# Patient Record
Sex: Male | Born: 1945
Health system: Southern US, Community
[De-identification: ages and names within clinical notes are randomized; demographics above are authoritative.]

## PROBLEM LIST (undated history)

## (undated) DIAGNOSIS — E785 Hyperlipidemia, unspecified: Secondary | ICD-10-CM

## (undated) DIAGNOSIS — E119 Type 2 diabetes mellitus without complications: Secondary | ICD-10-CM

## (undated) DIAGNOSIS — K219 Gastro-esophageal reflux disease without esophagitis: Secondary | ICD-10-CM

## (undated) DIAGNOSIS — I480 Paroxysmal atrial fibrillation: Secondary | ICD-10-CM

## (undated) DIAGNOSIS — R002 Palpitations: Secondary | ICD-10-CM

## (undated) DIAGNOSIS — I10 Essential (primary) hypertension: Secondary | ICD-10-CM

## (undated) HISTORY — PX: APPENDECTOMY: SHX54

## (undated) HISTORY — DX: Essential (primary) hypertension: I10

## (undated) HISTORY — DX: Type 2 diabetes mellitus without complications: E11.9

## (undated) HISTORY — PX: SHOULDER SURGERY: SHX246

## (undated) HISTORY — DX: Palpitations: R00.2

## (undated) HISTORY — PX: COLONOSCOPY: SHX174

## (undated) HISTORY — DX: Hyperlipidemia, unspecified: E78.5

## (undated) HISTORY — PX: CHOLECYSTECTOMY: SHX55

## (undated) HISTORY — DX: Gastro-esophageal reflux disease without esophagitis: K21.9

## (undated) HISTORY — DX: Paroxysmal atrial fibrillation: I48.0

## (undated) HISTORY — PX: HERNIA REPAIR: SHX51

## (undated) HISTORY — PX: TOTAL ANKLE ARTHROPLASTY: SHX811

## (undated) HISTORY — PX: LEG AMPUTATION: SHX1105

---

## 2003-02-21 ENCOUNTER — Ambulatory Visit (HOSPITAL_COMMUNITY): Admission: RE | Admit: 2003-02-21 | Discharge: 2003-02-21 | Payer: Self-pay | Admitting: Neurosurgery

## 2003-02-21 ENCOUNTER — Encounter: Payer: Self-pay | Admitting: Neurosurgery

## 2003-04-17 ENCOUNTER — Encounter: Payer: Self-pay | Admitting: Neurology

## 2003-04-17 ENCOUNTER — Encounter: Admission: RE | Admit: 2003-04-17 | Discharge: 2003-04-17 | Payer: Self-pay | Admitting: Neurology

## 2003-09-12 ENCOUNTER — Observation Stay (HOSPITAL_COMMUNITY): Admission: EM | Admit: 2003-09-12 | Discharge: 2003-09-12 | Payer: Self-pay | Admitting: Emergency Medicine

## 2004-05-18 ENCOUNTER — Ambulatory Visit (HOSPITAL_COMMUNITY): Admission: RE | Admit: 2004-05-18 | Discharge: 2004-05-18 | Payer: Self-pay | Admitting: Internal Medicine

## 2004-06-15 ENCOUNTER — Encounter: Admission: RE | Admit: 2004-06-15 | Discharge: 2004-06-15 | Payer: Self-pay | Admitting: Internal Medicine

## 2004-11-21 ENCOUNTER — Inpatient Hospital Stay (HOSPITAL_COMMUNITY): Admission: EM | Admit: 2004-11-21 | Discharge: 2004-11-25 | Payer: Self-pay | Admitting: Emergency Medicine

## 2004-11-21 ENCOUNTER — Ambulatory Visit: Payer: Self-pay | Admitting: *Deleted

## 2004-12-09 ENCOUNTER — Ambulatory Visit: Payer: Self-pay | Admitting: Internal Medicine

## 2010-05-04 ENCOUNTER — Ambulatory Visit: Payer: Self-pay | Admitting: Cardiovascular Disease

## 2010-05-05 ENCOUNTER — Telehealth (INDEPENDENT_AMBULATORY_CARE_PROVIDER_SITE_OTHER): Payer: Self-pay | Admitting: *Deleted

## 2010-05-17 ENCOUNTER — Telehealth (INDEPENDENT_AMBULATORY_CARE_PROVIDER_SITE_OTHER): Payer: Self-pay | Admitting: *Deleted

## 2010-05-18 ENCOUNTER — Ambulatory Visit: Payer: Self-pay

## 2010-05-18 ENCOUNTER — Encounter: Payer: Self-pay | Admitting: Cardiovascular Disease

## 2010-05-18 ENCOUNTER — Ambulatory Visit: Payer: Self-pay | Admitting: Cardiology

## 2010-05-18 ENCOUNTER — Encounter: Payer: Self-pay | Admitting: Cardiology

## 2010-05-18 ENCOUNTER — Ambulatory Visit (HOSPITAL_COMMUNITY): Admission: RE | Admit: 2010-05-18 | Discharge: 2010-05-18 | Payer: Self-pay | Admitting: Cardiovascular Disease

## 2010-05-18 ENCOUNTER — Encounter (HOSPITAL_COMMUNITY): Admission: RE | Admit: 2010-05-18 | Discharge: 2010-07-05 | Payer: Self-pay | Admitting: Cardiovascular Disease

## 2010-05-24 ENCOUNTER — Ambulatory Visit: Payer: Self-pay | Admitting: Cardiovascular Disease

## 2010-11-09 NOTE — Progress Notes (Signed)
----   Converted from flag ---- ---- 05/05/2010 9:57 AM, Marilynne Halsted, CMA, AAMA wrote: Pt has Medicare only.  no precert required  ---- 05/05/2010 8:42 AM, Dessie Coma  LPN wrote: Scheduled for Lexiscan and Echo at [address] on 8/9/11Dx: dyspnea  Thanks, Victorino Dike ------------------------------

## 2010-11-09 NOTE — Assessment & Plan Note (Signed)
Summary: Cardiology Nuclear Testing  Nuclear Med Background Indications for Stress Test: Evaluation for Ischemia   History: Heart Catheterization, Myocardial Perfusion Study  History Comments: >12 years ago Heart Cath: @ [facility] >10 years ago MPS: NL (in [place]) per Pt. H/O PAF  Symptoms: Chest Pain, Diaphoresis, Dizziness, DOE, Fatigue, Light-Headedness, Nausea, Near Syncope, Palpitations, Rapid HR, SOB, Syncope    Nuclear Pre-Procedure Cardiac Risk Factors: Family History - CAD, History of Smoking, Hypertension, Lipids, NIDDM Caffeine/Decaff Intake: None NPO After: 9:00 PM Lungs: Clear IV 0.9% NS with Angio Cath: 18g     IV Site: (R) AC IV Started by: Stanton Kidney EMT-P Chest Size (in) 42     Height (in): 70 Weight (lb): 214 BMI: 30.82 Tech Comments: Took metoprolol this am.  Nuclear Med Study 1 or 2 day study:  1 day     Stress Test Type:  Eugenie Birks Reading MD:  Willa Rough, MD     Referring MD:  Judie Petit. Arida Resting Radionuclide:  Technetium 31m Tetrofosmin     Resting Radionuclide Dose:  10.8 mCi  Stress Radionuclide:  Technetium 2m Tetrofosmin     Stress Radionuclide Dose:  32.7 mCi   Stress Protocol      Max HR:  86 bpm Max Systolic BP: 137 mm HgRate Pressure Product:  [address]  Lexiscan: 0.4 mg   Stress Test Technologist:  Irean Hong RN     Nuclear Technologist:  Harlow Asa CNMT  Rest Procedure  Myocardial perfusion imaging was performed at rest 45 minutes following the intravenous administration of Myoview Technetium 70m Tetrofosmin.  Stress Procedure  The patient received IV Lexiscan 0.4 mg over 15-seconds.  Myoview injected at 30-seconds.  There were no significant changes with infusion.  Quantitative spect images were obtained after a 45 minute delay.  QPS Raw Data Images:  Patient motion noted; appropriate software correction applied. Stress Images:  No diagnostic abnormalities Rest Images:  Same as stress Subtraction (SDS):  No evidence of  ischemia. Transient Ischemic Dilatation:  1.06  (Normal <1.22)  Lung/Heart Ratio:  .33  (Normal <0.45)  Quantitative Gated Spect Images QGS EDV:  90 ml QGS ESV:  31 ml QGS EF:  66 % QGS cine images:  Normal motion  Findings Normal nuclear study      Overall Impression  Exercise Capacity: Lexiscan BP Response: Normal blood pressure response. Clinical Symptoms: none ECG Impression: No significant ST segment change suggestive of ischemia. Overall Impression: Normal stress nuclear study.

## 2010-11-09 NOTE — Progress Notes (Signed)
Summary: Nuclear pre-Procedure  Phone Note Outgoing Call Call back at [facility] Phone [phone]   Call placed by: Stanton Kidney, EMT-P,  May 17, 2010 1:50 PM Summary of Call: Unable to leave message with information on Myoview Information Sheet (see scanned document for details). No answer, or machine.    Nuclear Med Background Indications for Stress Test: Evaluation for Ischemia    History Comments: H/O PAF  Symptoms: Chest Pain, Dizziness, DOE, Fatigue, Light-Headedness, Near Syncope, Palpitations    Nuclear Pre-Procedure Cardiac Risk Factors: Family History - CAD, History of Smoking, Hypertension, Lipids, NIDDM

## 2010-11-25 ENCOUNTER — Ambulatory Visit: Payer: Self-pay | Admitting: Cardiovascular Disease

## 2011-01-22 ENCOUNTER — Encounter: Payer: Self-pay | Admitting: Cardiovascular Disease

## 2011-02-22 NOTE — Assessment & Plan Note (Signed)
[facility]                        [place] CARDIOLOGY OFFICE NOTE   SARAH, ZERBY                        MRN:          [id number]  DATE:05/24/2010                            DOB:          1945-12-18    PROBLEMS LIST:  1. History of paroxysmal atrial fibrillation back in 2005 and 2006.      He used to be on flecainide at that time, but none in the last 5      years.  2. Type 2 diabetes.  3. Hypertension.  4. Hyperlipidemia.   I evaluated Mr. Moravek back in July for symptoms of atypical chest pain,  exertional dyspnea, and palpitations.  Since the last visit, the patient  informs me that he stopped some of the recent medication that were  prescribed to him because he felt that he was getting side effects from  these medication.  Overall, he stopped taking gabapentin, amitriptyline,  diltiazem.  According to him, these medications were prescribed recently  and feels that he was getting side effects from these medications.  Since then, he says that he has not had any more chest pain or  palpitations.  His dyspnea is still the same, but this is chronic in  nature.  He is usually aware if his heart is in atrial fibrillation and  he has not felt any episodes of palpitations.   CURRENT MEDICATIONS:  1. Metoprolol 25 mg twice daily.  2. Pravastatin 40 mg daily.  3. Glipizide 5 mg once daily.  4. Aspirin 81 mg once daily.   ALLERGIES:  No known drug allergies.   PHYSICAL EXAMINATION:  GENERAL:  Overall, he appears to be in no acute  distress.  VITAL SIGNS:  Weight is 219.4 pounds, blood pressure is 136/84, pulse  initially was 103 and subsequently was 93 beats per minute on the EKG.  NECK:  No JVD or carotid bruits.  LUNGS:  Clear to auscultation.  HEART:  Regular rate and rhythm with no gallops or murmurs.  ABDOMEN:  Benign, nontender, nondistended.  EXTREMITIES:  With no clubbing, cyanosis, or edema.   IMPRESSION:  1. Atypical chest pain:   The patient's symptoms have resolved      completely since the last time he was seen.  He did undergo cardiac      evaluation with Lexiscan nuclear stress test which showed no      evidence of ischemia or infarct with normal ejection fraction.      Given that his symptoms have resolved, no further testing is      needed.  2. Palpitations and history of paroxysmal atrial fibrillation:  The      patient stopped some of his medications that were recently      prescribed according to him and since then has not had any      symptoms.  He is not taking diltiazem anymore.  His heart rate is      somewhat in the high end.  Currently, he is taking metoprolol 25 mg      twice daily.  I advised him to discuss with his  primary care      physician about alternative to the medications he stopped.  In the      meanwhile for his cardiac status, I will go ahead and increase      metoprolol to 50 mg twice daily. No need to resume Diltiazem.  We      will continue with aspirin 81 mg once daily.  Again as mentioned      above, the patient is not having symptoms of palpitations anymore.      I do not think a Holter monitor will show much at this time.  Both      his EKGs last visit and this visit shows normal sinus rhythm.  We      did do an echocardiogram which showed normal LV systolic function      and normal left atrial size.  There was no left ventricular      hypertrophy.  I explained to the patient to return if he gets      recurrent symptoms.  Otherwise, he will follow up in 6 months from      now.     [name], MD  Electronically Signed    MA/MedQ  DD: 05/24/2010  DT: 05/25/2010  Job #: [id number]

## 2011-02-22 NOTE — Letter (Signed)
May 04, 2010    [name], M.D.  [address]  [place], [place]  [address]   RE:  Darrell Horne, Darrell Horne  MRN:  [id number]  /  DOB:  May 24, 1946   Dear Dr. Nelson Chimes:   Thank you for referring Mr. Plancarte for further cardiac evaluation.  As  you are aware, this is a pleasant 65 year old gentleman with the with  the following problem list:  1. Type 2 diabetes.  2. Hypertension.  3. Hyperlipidemia.  4. History of paroxysmal atrial fibrillation back in 2005 and 2006.      At that time, he used to be on flecainide and was treated by Dr.      Graciela Husbands.   The patient is here today regarding his recent symptoms of chest pain,  dyspnea and palpitations.  These symptoms have been happening over the  last 6 months but have significantly worsened over the last few weeks.  The patient describes episodes of palpitations and tachycardia.  He  measured his heart rate on a few occasions, and his heart rate was one  time 173 according to the machine.  He felt very dizzy and lightheaded.  He did not have any syncopal episode at that time.  These episodes are  associated with dyspnea and dizziness.  Usually these episodes have been  happening almost every other day and sometimes on a daily basis.  He has  a history of paroxysmal atrial fibrillation and was treated in the past  by Dr. Graciela Husbands with flecainide.  He tells me that he stopped taking the  medication after he ran out of refills, and that was more than 5 years  ago.  What is also concerning is he started having episodes of chest  pain recently.  He has two different kinds of chest pain.  One is in the  lower chest area and epigastric area described as indigestion.  He also  has left-sided sharp chest pain which is worse with a deep breath and  cough.  He gets out of breath very easily with minimal activities  currently.  He is not able to do much physical activity due to his back  problems.  He uses a cane.   PAST MEDICAL HISTORY:  As mentioned above.   PAST SURGICAL HISTORY:  1. Ankle repair.  2. Cholecystectomy.  3. Shoulder surgery.   SOCIAL HISTORY:  He used to smoke but quit in 1983.  He denies any  recreational drug use.  He drinks daily wine with supper, up to three  glasses on occasion.  He used to be a heavier drinker in the past.  He  is a retired [employer].   FAMILY HISTORY:  Negative for premature coronary artery disease.  His  sister had a cardiac stent done but she was in her early 54s.   REVIEW OF SYSTEMS:  Remarkable for dyspnea, generalized fatigue,  heartburn, arthritis pain, depression, chest pain, palpitations and  presyncope.  A full review of systems was performed and is otherwise  negative.   PHYSICAL EXAMINATION:  GENERAL:  He appears to be in no acute distress.  VITAL SIGNS:  Weight is 216 pounds, blood pressure is 106/67 and pulse  is 75 beats per minute.  NECK:  No masses, JVD or carotid bruits.  RESPIRATORY:  Normal respiratory effort with no use of accessory  muscles.  Auscultation reveals normal breath sounds with no crackles or  wheezing.  CARDIOVASCULAR:  Normal PMI.  Normal S1-S2 with no appreciated  gallops  or murmurs.  ABDOMEN:  Benign, nontender, nondistended with no masses felt.  EXTREMITIES:  With no clubbing, cyanosis or edema.  SKIN:  Unremarkable with no rash.  PSYCHIATRIC:  He is alert and oriented x3.  Mood and affect are normal.   ELECTROCARDIOGRAM:  Electrocardiogram was done and interpreted by me.  This shows normal sinus rhythm with no significant ST or T-wave changes.   IMPRESSION:  1. Chest pain:  This has some anginal and some non-anginal features.      Most of these episodes are happening at rest.  He has a component      of indigestion which might be due to gastroesophageal reflux      disease.  He also has pleuritic chest pain on the left side.      Obviously, he has multiple risk factors for coronary artery disease      including type 2 diabetes, hypertension and  hyperlipidemia.  This      definitely will require further evaluation, and I recommend      proceeding with a Lexiscan nuclear stress test and an      echocardiogram.  In the meanwhile, I asked him to start taking      aspirin 81 mg daily.  He will continue the rest of his medications.  2. Palpitations and history of paroxysmal atrial fibrillation:  He      used to be on flecainide in the past but has not taken it for at      least 5 years.  It is possible that he is having recurrent episodes      of atrial fibrillation.  He is currently in sinus rhythm.  I will      obtain a 48-hour Holter monitor for further evaluation.  If atrial      fibrillation is confirmed, then we might need to consider long-term      anticoagulation with warfarin, given that his CHADS II score is 2.      If no arrhythmia is identified in 48 hours monitoring, then we      might need longer monitoring with an event monitor.  3. Hypertension.  Blood pressure is well-controlled.  The patient will      follow-up after this cardiac testing is performed.   Thank you for allowing me to participate in the care of your patient.    Sincerely,      [name], MD  Electronically Signed    MA/MedQ  DD: 05/04/2010  DT: 05/04/2010  Job #: [id number]

## 2011-02-24 ENCOUNTER — Emergency Department (HOSPITAL_COMMUNITY): Payer: Medicare Other

## 2011-02-24 ENCOUNTER — Inpatient Hospital Stay (HOSPITAL_COMMUNITY)
Admission: EM | Admit: 2011-02-24 | Discharge: 2011-02-27 | DRG: 312 | Disposition: A | Discharge status: Home / Self Care | Payer: Medicare Other | Attending: Internal Medicine | Admitting: Internal Medicine

## 2011-02-24 DIAGNOSIS — I4891 Unspecified atrial fibrillation: Secondary | ICD-10-CM | POA: Diagnosis present

## 2011-02-24 DIAGNOSIS — E872 Acidosis, unspecified: Secondary | ICD-10-CM | POA: Diagnosis present

## 2011-02-24 DIAGNOSIS — E119 Type 2 diabetes mellitus without complications: Secondary | ICD-10-CM | POA: Diagnosis present

## 2011-02-24 DIAGNOSIS — F3289 Other specified depressive episodes: Secondary | ICD-10-CM | POA: Diagnosis present

## 2011-02-24 DIAGNOSIS — I10 Essential (primary) hypertension: Secondary | ICD-10-CM | POA: Diagnosis present

## 2011-02-24 DIAGNOSIS — I9589 Other hypotension: Principal | ICD-10-CM | POA: Diagnosis present

## 2011-02-24 DIAGNOSIS — T465X5A Adverse effect of other antihypertensive drugs, initial encounter: Secondary | ICD-10-CM | POA: Diagnosis present

## 2011-02-24 DIAGNOSIS — E871 Hypo-osmolality and hyponatremia: Secondary | ICD-10-CM | POA: Diagnosis present

## 2011-02-24 DIAGNOSIS — E785 Hyperlipidemia, unspecified: Secondary | ICD-10-CM | POA: Diagnosis present

## 2011-02-24 DIAGNOSIS — F329 Major depressive disorder, single episode, unspecified: Secondary | ICD-10-CM | POA: Diagnosis present

## 2011-02-24 DIAGNOSIS — Z79899 Other long term (current) drug therapy: Secondary | ICD-10-CM

## 2011-02-24 DIAGNOSIS — N179 Acute kidney failure, unspecified: Secondary | ICD-10-CM | POA: Diagnosis present

## 2011-02-24 DIAGNOSIS — Z87891 Personal history of nicotine dependence: Secondary | ICD-10-CM

## 2011-02-24 DIAGNOSIS — Z7982 Long term (current) use of aspirin: Secondary | ICD-10-CM

## 2011-02-24 DIAGNOSIS — I129 Hypertensive chronic kidney disease with stage 1 through stage 4 chronic kidney disease, or unspecified chronic kidney disease: Secondary | ICD-10-CM | POA: Diagnosis present

## 2011-02-24 DIAGNOSIS — Y92009 Unspecified place in unspecified non-institutional (private) residence as the place of occurrence of the external cause: Secondary | ICD-10-CM

## 2011-02-24 DIAGNOSIS — N181 Chronic kidney disease, stage 1: Secondary | ICD-10-CM | POA: Diagnosis present

## 2011-02-24 DIAGNOSIS — D72829 Elevated white blood cell count, unspecified: Secondary | ICD-10-CM | POA: Diagnosis present

## 2011-02-24 LAB — GLUCOSE, CAPILLARY: Glucose-Capillary: 209 mg/dL — ABNORMAL HIGH (ref 70–99)

## 2011-02-24 LAB — CBC
HCT: 41.2 % (ref 39.0–52.0)
Hemoglobin: 14.7 g/dL (ref 13.0–17.0)
MCH: 34.8 pg — ABNORMAL HIGH (ref 26.0–34.0)
MCHC: 35.7 g/dL (ref 30.0–36.0)
MCV: 97.6 fL (ref 78.0–100.0)
Platelets: 230 10*3/uL (ref 150–400)
RBC: 4.22 MIL/uL (ref 4.22–5.81)
RDW: 14.5 % (ref 11.5–15.5)
WBC: 12.5 10*3/uL — ABNORMAL HIGH (ref 4.0–10.5)

## 2011-02-24 LAB — BASIC METABOLIC PANEL
BUN: 10 mg/dL (ref 6–23)
CO2: 26 mEq/L (ref 19–32)
Calcium: 9.7 mg/dL (ref 8.4–10.5)
Chloride: 98 mEq/L (ref 96–112)
Creatinine, Ser: 1.92 mg/dL — ABNORMAL HIGH (ref 0.4–1.5)
GFR calc Af Amer: 43 mL/min — ABNORMAL LOW (ref >60)
GFR calc non Af Amer: 35 mL/min — ABNORMAL LOW (ref >60)
Glucose, Bld: 200 mg/dL — ABNORMAL HIGH (ref 70–99)
Potassium: 4.8 mEq/L (ref 3.5–5.1)
Sodium: 133 mEq/L — ABNORMAL LOW (ref 135–145)

## 2011-02-24 LAB — LACTIC ACID, PLASMA: Lactic Acid, Venous: 2.5 mmol/L — ABNORMAL HIGH (ref 0.5–2.2)

## 2011-02-25 LAB — CARDIAC PANEL(CRET KIN+CKTOT+MB+TROPI)
CK, MB: 4.8 ng/mL — ABNORMAL HIGH (ref 0.3–4.0)
CK, MB: 5.8 ng/mL — ABNORMAL HIGH (ref 0.3–4.0)
CK, MB: 5.6 ng/mL — ABNORMAL HIGH (ref 0.3–4.0)
Relative Index: INVALID (ref 0.0–2.5)
Relative Index: INVALID (ref 0.0–2.5)
Relative Index: INVALID (ref 0.0–2.5)
Total CK: 72 U/L (ref 7–232)
Total CK: 85 U/L (ref 7–232)
Total CK: 79 U/L (ref 7–232)
Troponin I: <0.3 ng/mL (ref <0.30)
Troponin I: <0.3 ng/mL (ref <0.30)
Troponin I: <0.3 ng/mL (ref <0.30)

## 2011-02-25 LAB — HEMOGLOBIN A1C
Hgb A1c MFr Bld: 6.1 % — ABNORMAL HIGH (ref <5.7)
Mean Plasma Glucose: 128 mg/dL — ABNORMAL HIGH (ref <117)

## 2011-02-25 LAB — URINALYSIS, ROUTINE W REFLEX MICROSCOPIC
Bilirubin Urine: NEGATIVE
Glucose, UA: NEGATIVE mg/dL
Hgb urine dipstick: NEGATIVE
Ketones, ur: NEGATIVE mg/dL
Nitrite: NEGATIVE
Protein, ur: NEGATIVE mg/dL
Specific Gravity, Urine: 1.018 (ref 1.005–1.030)
Urobilinogen, UA: 1 mg/dL (ref 0.0–1.0)
pH: 5 (ref 5.0–8.0)

## 2011-02-25 LAB — GLUCOSE, CAPILLARY
Glucose-Capillary: 125 mg/dL — ABNORMAL HIGH (ref 70–99)
Glucose-Capillary: 100 mg/dL — ABNORMAL HIGH (ref 70–99)
Glucose-Capillary: 146 mg/dL — ABNORMAL HIGH (ref 70–99)
Glucose-Capillary: 152 mg/dL — ABNORMAL HIGH (ref 70–99)

## 2011-02-25 LAB — TSH: TSH: 1.406 u[IU]/mL (ref 0.350–4.500)

## 2011-02-25 LAB — CBC
HCT: 38.5 % — ABNORMAL LOW (ref 39.0–52.0)
Hemoglobin: 12.9 g/dL — ABNORMAL LOW (ref 13.0–17.0)
MCH: 33.1 pg (ref 26.0–34.0)
MCHC: 33.5 g/dL (ref 30.0–36.0)
MCV: 98.7 fL (ref 78.0–100.0)
Platelets: 171 10*3/uL (ref 150–400)
RBC: 3.9 MIL/uL — ABNORMAL LOW (ref 4.22–5.81)
RDW: 14.7 % (ref 11.5–15.5)
WBC: 9 10*3/uL (ref 4.0–10.5)

## 2011-02-25 LAB — TROPONIN I: Troponin I: <0.3 ng/mL (ref <0.30)

## 2011-02-25 LAB — LIPID PANEL
Cholesterol: 182 mg/dL (ref 0–200)
HDL: 77 mg/dL (ref >39)
LDL Cholesterol: 87 mg/dL (ref 0–99)
Total CHOL/HDL Ratio: 2.4 RATIO
Triglycerides: 91 mg/dL (ref <150)
VLDL: 18 mg/dL (ref 0–40)

## 2011-02-25 LAB — CK TOTAL AND CKMB (NOT AT ARMC)
CK, MB: 4.2 ng/mL — ABNORMAL HIGH (ref 0.3–4.0)
Relative Index: INVALID (ref 0.0–2.5)
Total CK: 71 U/L (ref 7–232)

## 2011-02-25 LAB — MAGNESIUM: Magnesium: 1.9 mg/dL (ref 1.5–2.5)

## 2011-02-25 LAB — PHOSPHORUS: Phosphorus: 3 mg/dL (ref 2.3–4.6)

## 2011-02-25 LAB — PRO B NATRIURETIC PEPTIDE: Pro B Natriuretic peptide (BNP): 504.2 pg/mL — ABNORMAL HIGH (ref 0–125)

## 2011-02-25 NOTE — Discharge Summary (Signed)
NAME:  Darrell Horne, Darrell Horne                 ACCOUNT NO.:  [id number]   MEDICAL RECORD NO.:  [id number]          PATIENT TYPE:  INP   LOCATION:  4741                         FACILITY:  [facility]   PHYSICIAN:  Mark C. Ophelia Charter, M.D.    DATE OF BIRTH:  June 10, 1946   DATE OF ADMISSION:  11/21/2004  DATE OF DISCHARGE:  11/25/2004                                 DISCHARGE SUMMARY   FINAL DIAGNOSES:  1.  Right trimalleolar ankle fracture dislocation with open reduction and      internal fixation, medial and lateral malleolus.  2.  Supraventricular tachycardia with left bundle branch block.  3.  Coronary artery disease.  4.  Atrial fibrillation and angina.   CONSULTATIONS:  Duke Salvia, MD, Cardiology.   PROCEDURE:  ORIF, right medial and lateral malleolus, for a trimalleolar  ankle fracture, November 22, 2004.   HISTORY OF PRESENT ILLNESS:  This 65 year old healthy male fell on steps  suffering a right trimalleolar ankle fracture.  He is followed by Dr. Graciela Husbands  and is on Cardizem-XL 240 mg daily, Toprol 25 mg daily, Flecanamide 50 mg  daily for a history of atrial-fib, and one baby aspirin a day.  The patient  was admitted, and routine preop labs included a CBC with a hemoglobin of 14,  PT and PTT were normal.  Electrolytes showed normal findings with a  borderline low calcium of 8.1.  Triglycerides 88, HDL 65, LDL cholesterol  105, bLDL cholesterol 18.  X-rays of the ankle showed trimalleolar ankle  fracture dislocation.  EKG showed sinus bradycardia, left axis deviation.   The patient was seen by Cardiology.  Postoperatively he was on a telemetry  bed.  He had a run of SVT the morning of November 23, 2004.  He was seen by  Physical Therapy.  He made good progress and was discharged on November 25, 2004.   SPECIAL INSTRUCTIONS:  1.  His medications at discharge were the same as on admission plus Tylox.  2.  He was ambulatory and released by Physical Therapy.  3.  He was seen by the case manager  for home care needs for crutches.  4.  He was asked to follow up in one week with Dr. Ophelia Charter and in two weeks      with Dr. Graciela Husbands.   CONDITION ON DISCHARGE:  Satisfactory.      MCY/MEDQ  D:  01/23/2005  T:  01/23/2005  Job:  [id number]

## 2011-02-25 NOTE — Op Note (Signed)
NAME:  Darrell Horne, Darrell Horne                 ACCOUNT NO.:  [id number]   MEDICAL RECORD NO.:  [id number]          PATIENT TYPE:  INP   LOCATION:  5035                         FACILITY:  [facility]   PHYSICIAN:  Mark C. Ophelia Charter, M.D.    DATE OF BIRTH:  1946-08-05   DATE OF PROCEDURE:  11/22/2004  DATE OF DISCHARGE:                                 OPERATIVE REPORT   PREOPERATIVE DIAGNOSIS:  Right trimalleolar ankle fracture dislocation.   POSTOPERATIVE DIAGNOSIS:  Right trimalleolar ankle fracture dislocation.   OPERATION PERFORMED:  Open reduction internal fixation, right medial and  lateral malleolus for trimalleolar ankle fracture.   SURGEON:  Mark C. Ophelia Charter, M.D.   ANESTHESIA:  General orotracheal anesthesia.   TOURNIQUET TIME:  37 minutes times 350 mmHg.   DESCRIPTION OF PROCEDURE:  After induction of general anesthesia,  orotracheal intubation, preoperative Ancef prophylaxis, standard DuraPrep up  to the knee, sterile stockinette and extremity sheet drapes were applied.  Medial side was opened first with a  C-shaped incision.  Joint was  irrigated.  There were no fragments in between and the posterior tib was  behind the medial malleolus.  Next, the lateral incision was opened.  Fibula  was distracted, irrigated, reduced, held with clamp and a 6-hole one third  tubular plate applied with two distal screws being unicortical cancellous  and proximal three screws bicortical.  Interfrag screw was then placed from  proximal anterior to posterior which was a 28 mm screw.  It was checked  under fluoroscopy and was anatomic.  Next, the medial malleolus was reduced  with a towel clip after repeat irrigation, pinned and two 40 mm cancellous  lag screws were placed.  Instrument count and needle count was correct.  Subcutaneous tissue was reapproximated with 2-0 Vicryl.  Skin staple  closure.  Xeroform, 4 x 4s, Webril and a short leg splint.  Patient was  transferred to recovery in stable  condition.      MCY/MEDQ  D:  11/22/2004  T:  11/23/2004  Job:  [id number]

## 2011-02-25 NOTE — H&P (Signed)
NAME:  Darrell Horne, Darrell Horne                           ACCOUNT NO.:  [id number]   MEDICAL RECORD NO.:  [id number]                   PATIENT TYPE:  EMS   LOCATION:  MAJO                                 FACILITY:  [facility]   PHYSICIAN:  Mark A. Perini, M.D.                DATE OF BIRTH:  [birth year]   DATE OF ADMISSION:  09/12/2003  DATE OF DISCHARGE:                                HISTORY & PHYSICAL   CHIEF COMPLAINT:  Palpitations, fast heart rate, shortness of breath.   HISTORY OF PRESENT ILLNESS:  Mr. Baratta is a pleasant 65 year old male with a  history significant for atrial fibrillation who awoke at 1 o'clock in the  morning and went to get a glass of water.  He suddenly had the onset of  palpitations and pounding heart.  He did have some shortness of breath with  this but no diaphoresis or chest pain.  He had felt normal when he went to  bed and in the last few days.  He has been fighting a cold the last two or  three days; however, he has not taken any over-the-counter medicines.  He  presented to the emergency room and was found to be in atrial fibrillation  with a rate of 160 beats per minute.  He was given 1 dose of 25 mg Cardizem  intravenously, and his heart rate is not 89 to 110, and he will require  admission for further evaluation.   PAST MEDICAL HISTORY:  1. Atrial fibrillation.  The patient reports possibly having some symptoms     of this on and off for the last several years, but it has been more so in     the last year.  He had a more significant episode in September 2004 when     he was admitted to [facility].  Apparently he had an echocardiogram and     some sort of Cardiolite study.  He reports that his stress test was     negative, and he was discharged on Coumadin and Cartia.  He is not sure     if he is normally in normal sinus rhythm lately or if he is in atrial     fibrillation chronically.  2. Gastroesophageal reflux disease.  3. No history of MI.  4. History of  appendectomy.  5. History of left shoulder surgery 10 years ago.  6. No history of diabetes.  7. History of hyperlipidemia.  8. History of low back pain and some left lower extremity radiculopathy-type     symptoms.   ALLERGIES:  No known allergies.   MEDICATIONS:  His INR was 2.1 yesterday, and he does take Coumadin 5 mg  daily with 7.5 mg on Tuesdays and Thursdays.  He is on Cartia XT 240 mg  daily.  He occasionally takes Prilosec as needed for reflux.  He has not  taken any over-the-counter medicines, although  he did take 1 Advil four days  ago.   SOCIAL HISTORY:  No tobacco currently.  He does have a 10-pack-year smoking  history but quit 20 years ago.  He drinks about two beers per day on  average, but he has had no increased alcohol drinking lately.  There is no  drug use history.   FAMILY HISTORY:  Mother is alive at age 75.  She has some mild heart  problems but no significant problems.  Father died at age 88 of a stroke.  He has three sisters and one brother.  One sister had diabetes.  One older  sister has had bypass at age 84.   REVIEW OF SYSTEMS:  The patient says he has had cold symptoms for one week  with some cough, headache, and nose and chest congestion.  He is  occasionally having productive cough at times.  He feels a little tired and  little bit short of breath at times.  No fevers, no chest pain, no edema,  although he did report his left calf being 1 cm larger than the right calf  for a day or two approximately two to three weeks ago.  He was also having  some left lower extremity symptoms that he thought was related to his back  at that time.  He denies any distant travel.  No genitourinary symptoms, no  abdominal symptoms, although he does have some reflux which is maybe a  little more recently.  He has one-time nocturia which is normal for him.   PHYSICAL EXAMINATION:  VITAL SIGNS:  Temperature 97.9, pulse originally 160,  now 89 to 105.  Respiratory rate  20, blood pressure 122/81, 100% saturation  on room air.  GENERAL:  He is in no acute distress, alert and oriented x 4.  He is  appropriate.  NECK:  No JVD, no bruits.  HEENT:  No icterus.  LUNGS:  Reveal bronchial breath sounds wit no wheezes, rales, or rhonchi.  HEART:  Irregular, irregular with no murmur, rub, or gallop.  ABDOMEN:  Soft, nontender, nondistended, with no mass or hepatosplenomegaly.  EXTREMITIES:  No edema, 2+ pulses throughout.   LABORATORY DATA:  Chest x-ray reveals slight cardiac enlargement,  questionably some increase in interstitial markings.   EKG shows atrial fibrillation with rapid ventricular response but no  significant ST or T wave changes.  Brain natriuretic peptide is less than  32.  PTT 47, INR 2.4.  White count 7.2 with a normal differential,  hemoglobin 14.6, platelet count 298,000.  CMET and cardiac enzymes are  pending at this time.   ASSESSMENT/PLAN:  A 65 year old male with atrial fibrillation with rapid  ventricular response.  There is not a clear cause for his escape of rate  control as he states he has been compliant with his Cartia XT.  Furthermore,  it is not clear if he has normally maintained a normal rhythm or if he has  been maintained in chronic atrial fibrillation.  His worsening may be due to  his viral syndrome or some bronchitis.  I doubt ongoing ischemia given his  recent negative stress test per his report, and his current EKG showing no  significant changes despite the tachycardia.  However, we will admit him to  a telemetry bed and place him on an IV Cardizem drip.  Will continue his  oral Coumadin.  Will obtain his [facility] records.  We will defer  the addition of other rate control agents and defer  the decision considering  cardiology consultation to his primary care physician.  We will add an  Atrovent MDI for bronchitis and continue proton pump inhibitor therapy.                                                Mark A. Waynard Edwards, M.D.    MAP/MEDQ  D:  09/12/2003  T:  09/12/2003  Job:  [id number]   cc:   Barry Dienes. Eloise Harman, M.D.  [address]  [place]  [place] [address]  Fax: [phone]

## 2011-02-25 NOTE — Consult Note (Signed)
NAME:  Darrell Horne, Darrell Horne NO.:  [id number]   MEDICAL RECORD NO.:  [id number]          PATIENT TYPE:  INP   LOCATION:  1826                         FACILITY:  [facility]   PHYSICIAN:  Vida Roller, M.D.   DATE OF BIRTH:  1945/11/11   DATE OF CONSULTATION:  11/21/2004  DATE OF DISCHARGE:                                   CONSULTATION   PRIMARY CARE PHYSICIAN:  Dr. Ivery Quale.   PRIMARY CARDIOLOGIST:  Dr. Sherryl Manges.   CHIEF COMPLAINT:  Preoperative clearance.   HISTORY OF PRESENT ILLNESS:  Mr. Raczkowski is a 65 year old male with no history  of coronary artery disease.  He has a history of paroxysmal atrial  fibrillation.  He fell this a.m. as he was ambulating but there was no  syncope, no palpitations and nothing that contributed to his fall.  He has a  trimalleolar ankle fracture and preoperative clearance is requested by  orthopedics prior to surgical repair.   Mr. Londo denies any chest pain or shortness of breath.  He has had no  recent palpitations and no recent syncope.  He has been compliant with his  medications.   PAST MEDICAL HISTORY:  1.  Significant for dyslipidemia.  2.  He has atrial fibrillation which is paroxysmal atrial fibrillation with      rapid ventricular response.  3.  Gastroesophageal reflux disease symptoms.  4.  History of rate-related left bundle branch block during a treadmill test      while on flecainide.  5.  Status post ischemic evaluation in [place] with an echocardiogram and      Cardiolite which showed no scars, ischemia, and an ejection fraction of      72% by report.   SURGICAL HISTORY:  Appendectomy and left shoulder arthroscopic surgery.   ALLERGIES:  No known drug allergies.   MEDICATIONS:  1.  Flecainide 50 mg b.i.d.  2.  Protonix daily.  3.  Zelnorm 6 mg b.i.d.  4.  Cardizem XL 240 mg daily.  5.  Toprol-XL 25 mg daily.  6.  Aspirin 81 mg daily.   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  He denies tobacco or drug abuse.  He drinks 2-3 rum drinks per day.  He states that he has tried to quit drinking in the past but was unable to  do so.   FAMILY HISTORY:  His mother is alive at age 19 with a history of heart  disease.  His father died at age 72 of a cerebrovascular accident with no  history of coronary artery disease.  He has a sister with heart disease.   REVIEW OF SYSTEMS:  Significant for chronic dyspnea on exertion with no  recent change.  He has had no recent complications with syncope.  He states  that he does have problems with depression and anxiety.  He has some chronic  arthralgias and pain in his right ankle at this time.   REVIEW OF SYSTEMS:  Otherwise negative.   PHYSICAL EXAMINATION:  VITAL SIGNS:  Temperature is 97.7, blood pressure  105/64, heart rate 57, respiratory rate 20, O2 saturation 96% on room air.  GENERAL:  He is a well-developed, well-nourished white male who smells of  EtOH.  HEENT:  Pupils are equal, round, reactive to light with extraocular  movements intact.  Sclerae are clear.  NECK:  Supple and there is no thyromegaly, bruit or JVD noted.  CARDIOVASCULAR:  His heart is regular in rate and rhythm with an S1, S2 and  no significant murmurs, rubs or gallops are noted.  LUNGS:  Clear to auscultation bilaterally.  SKIN:  No rashes are noted.  ABDOMEN:  Soft and nontender with active bowel sounds.  EXTREMITIES:  There is a splint on the right lower extremity.  The left  lower extremity and both upper extremities have 2+ pulses.  NEUROLOGICAL:  He is alert and oriented.  Cranial nerves II-XII grossly  intact.   EKG:  Sinus bradycardia, rate of 54.  His axis is -30 and QTc is 417.  There  is no Q waves, no ischemic changes and no hypertrophy.   LABORATORY VALUES:  Pending at the time of dictation.   ASSESSMENT AND PLAN:  1.  Status post trimalleolar right lower extremity fracture. Repair per      orthopedic.  2.   Long paroxysmal atrial fibrillation, maintained in sinus rhythm on      flecainide.  3.  Possible EtOH dependency.  He has no history of withdrawals.  4.  History of ischemic work up including an echocardiogram and Cardiolite      in 2004 without abnormalities.  Because of his atrial fibrillation, we      recommend telemetry monitoring for 24-48 hours after surgery.      Preoperatively, it is not necessary.  His flecainide, Toprol-XL, and      Cardizem will be continued at preoperative doses.  No further ischemic      workup is indicated preoperatively at this time but he will be followed      closely in the postoperative period.   Dr. Dorethea Clan saw the patient and determined the plan of care.      RB/MEDQ  D:  11/21/2004  T:  11/21/2004  Job:  [id number]

## 2011-02-26 LAB — COMPREHENSIVE METABOLIC PANEL
ALT: 21 U/L (ref 0–53)
AST: 26 U/L (ref 0–37)
Albumin: 2.6 g/dL — ABNORMAL LOW (ref 3.5–5.2)
Alkaline Phosphatase: 118 U/L — ABNORMAL HIGH (ref 39–117)
BUN: 13 mg/dL (ref 6–23)
CO2: 27 mEq/L (ref 19–32)
Calcium: 8.9 mg/dL (ref 8.4–10.5)
Chloride: 105 mEq/L (ref 96–112)
Creatinine, Ser: 1.13 mg/dL (ref 0.4–1.5)
GFR calc Af Amer: >60 mL/min (ref >60)
GFR calc non Af Amer: >60 mL/min (ref >60)
Glucose, Bld: 136 mg/dL — ABNORMAL HIGH (ref 70–99)
Potassium: 3.8 mEq/L (ref 3.5–5.1)
Sodium: 138 mEq/L (ref 135–145)
Total Bilirubin: 0.7 mg/dL (ref 0.3–1.2)
Total Protein: 5.9 g/dL — ABNORMAL LOW (ref 6.0–8.3)

## 2011-02-26 LAB — CBC
HCT: 37 % — ABNORMAL LOW (ref 39.0–52.0)
Hemoglobin: 12.8 g/dL — ABNORMAL LOW (ref 13.0–17.0)
MCH: 33.8 pg (ref 26.0–34.0)
MCHC: 34.6 g/dL (ref 30.0–36.0)
MCV: 97.6 fL (ref 78.0–100.0)
Platelets: 167 10*3/uL (ref 150–400)
RBC: 3.79 MIL/uL — ABNORMAL LOW (ref 4.22–5.81)
RDW: 14.2 % (ref 11.5–15.5)
WBC: 8.5 10*3/uL (ref 4.0–10.5)

## 2011-02-26 LAB — CARDIAC PANEL(CRET KIN+CKTOT+MB+TROPI)
CK, MB: 4.9 ng/mL — ABNORMAL HIGH (ref 0.3–4.0)
Relative Index: INVALID (ref 0.0–2.5)
Total CK: 62 U/L (ref 7–232)
Troponin I: <0.3 ng/mL (ref <0.30)

## 2011-02-26 LAB — GLUCOSE, CAPILLARY
Glucose-Capillary: 117 mg/dL — ABNORMAL HIGH (ref 70–99)
Glucose-Capillary: 101 mg/dL — ABNORMAL HIGH (ref 70–99)
Glucose-Capillary: 95 mg/dL (ref 70–99)
Glucose-Capillary: 101 mg/dL — ABNORMAL HIGH (ref 70–99)

## 2011-02-27 LAB — GLUCOSE, CAPILLARY
Glucose-Capillary: 122 mg/dL — ABNORMAL HIGH (ref 70–99)
Glucose-Capillary: 107 mg/dL — ABNORMAL HIGH (ref 70–99)
Glucose-Capillary: 103 mg/dL — ABNORMAL HIGH (ref 70–99)

## 2011-03-10 NOTE — H&P (Signed)
NAME:  DORRIS, VANGORDER                 ACCOUNT NO.:  [id number]  MEDICAL RECORD NO.:  [id number]           PATIENT TYPE:  O  LOCATION:  6742                         FACILITY:  [facility]  PHYSICIAN:  Lonia Blood, M.D.      DATE OF BIRTH:  08-Nov-1945  DATE OF ADMISSION:  02/24/2011 DATE OF DISCHARGE:                             HISTORY & PHYSICAL   PRIMARY CARE PHYSICIAN:  He is unassigned to [place].  PRESENTING COMPLAINT:  Near syncope.  HISTORY OF PRESENT ILLNESS:  The patient is a 65 year old male with history of hypertension and multiple other problems including atrial fibrillation.  He is on multiple medications and apparently was at home today prior to taking his medicine when he started feeling like he was going to pass out.  He was dizzy and the whole area was spinning and his eyes seemed like they are cut and being drawn.  His wife took his blood pressure at that time and it was 70/40.  He was therefore rushed to the emergency room.  In the ED, his initial blood pressure was found to be 80/50.  He responded slightly to hydration with bolus of 1 L of normal saline and systolic went to 14/78.  The patient is currently not feeling as bad as he was.  PAST MEDICAL HISTORY:  Significant for: 1. Paroxysmal atrial fibrillation. 2. Diabetes. 3. Hyperlipidemia. 4. History of hyponatremia. 5. Depression. 6. Hypertension. 7. Remote tobacco abuse. 8. Hyperlipidemia.  ALLERGIES:  No known drug allergies.  CURRENT MEDICATIONS: 1. Amitriptyline 25 mg nightly. 2. Aspirin 81 mg daily. 3. Atorvastatin 40 mg daily. 4. Citalopram 20 mg daily. 5. Gabapentin 300 mg t.i.d. 6. Glipizide 2.5 mg daily. 7. Losartan 50 mg daily. 8. Metformin 500 mg daily. 9. Metoprolol 50 mg twice a day. 10.Pradaxa 150 mg twice a day. 11.Taztia XT 300 mg daily.  SOCIAL HISTORY:  The patient lives here in [place] with his wife.  He used to smoke about a pack per day, but quit many years ago.  Denied  any alcohol or IV drug use.  FAMILY HISTORY:  Denied any significant family history except for hypertension.  REVIEW OF SYSTEMS:  All systems reviewed and are negative except per HPI.  PHYSICAL EXAMINATION:  VITAL SIGNS:  Temperature is 98.7, current blood pressure 94/49, initially 80/50, his pulse is 62, respiratory rate 12, sats 98% on room air. GENERAL:  He is awake, alert, and oriented.  He is in no acute distress. HEENT:  PERRLA.  EOMI.  No pallor, no jaundice.  No rhinorrhea. NECK:  Supple.  No JVD.  No lymphadenopathy. RESPIRATORY:  He has good air entry bilaterally.  No wheezes.  No rales. No crackles. CARDIOVASCULAR:  She has S1 and S2.  No audible murmur. ABDOMEN:  Soft, full, nontender with positive bowel sounds. EXTREMITIES:  No edema, cyanosis, or clubbing. SKIN:  No rashes or ulcers. MUSCULOSKELETAL:  No joint swelling or tenderness.  LABORATORY DATA:  White count is 12.5, hemoglobin 14.7, platelet of 230. Sodium is 133, potassium 4.8, chloride 98, CO2 of 26, glucose 200, BUN 10, creatinine 1.92, calcium 9.7.  Lactic acid level is 2.5.  Urinalysis showed amber urine, which is cloudy, but otherwise negative.  Chest x- ray showed no active cardiopulmonary disease.  His EKG showed normal sinus rhythm with a rate of 85, possible evidence of LVH.  ASSESSMENT:  This is a 65 year old gentleman who presented with presyncope more than likely secondary to hypotension.  The cause of his hypotension needs to be addressed, but otherwise the patient's presyncope seems to be from the low blood pressure.  PLAN: 1. Presyncope.  Again, we will address the cause of his hypotension.     This should fix the presyncopal episode right.  Currently, he is     feeling better, not dizzy after bolus of IV fluids. 2. Hypotension. A number of causes are possible.  The patient is on     multiple blood pressure medications that may have caused this.  He     is on Taztia, metoprolol as well as  losartan.  He heart rate is     also low.  He is slightly bradycardic, which may be secondary to     [place] as well as the metoprolol.  It could also be some intrinsic     cardiac disease.  Plan therefore will be to hold on those     antihypertensives, hydrate the patient, follow him closely on tele     monitoring, get 2-D echo. 3. Diabetes.  Put him on sliding scale insulin.  I will hold the     metformin. 4. History of hypertension.  Based on the patient's hypotensive     episode, again we will hold his blood pressure medications. 5. Hyperlipidemia.  Check fasting lipid panel.  Continue with statin. 6. Atrial fibrillation.  The patient's EKG is showing intermittent     atrial fibrillation.  We will continue the Pradaxa and continue to     watch him on tele.  His rate is now controlled. 7. Depression/anxiety.  He seems stable at this point. 8. Acute renal failure.  His creatinine is elevated, and the patient     denied ever being told that he has renal insufficiency, so this     could be due to the hypotension and his elevated creatinine could     be prerenal.  We will hydrate him and follow renal function     closely.     Lonia Blood, M.D.     Verlin Grills  D:  02/25/2011  T:  02/25/2011  Job:  [id number]  Electronically Signed by Lonia Blood M.D. on 03/10/2011 11:47:31 AM

## 2011-03-20 NOTE — Discharge Summary (Signed)
NAME:  Darrell Horne, Darrell Horne                 ACCOUNT NO.:  [id number]  MEDICAL RECORD NO.:  [id number]           PATIENT TYPE:  O  LOCATION:  6742                         FACILITY:  [facility]  PHYSICIAN:  [name], MD        DATE OF BIRTH:  11-27-1945  DATE OF ADMISSION:  02/24/2011 DATE OF DISCHARGE:                              DISCHARGE SUMMARY   PRIMARY CARE PHYSICIAN:  Dr. [name].  CARDIOLOGIST:  Dr. [name] in [place].  DISCHARGE DIAGNOSES: 1. Hypotension, likely secondary to multiple antihypertensive     medications. 2. Paroxysmal atrial fibrillation on Pradaxa. 3. Diabetes mellitus, A1c of 6.1. 4. History hyponatremia. 5. Depression. 6. Hypertension. 7. Hyperlipidemia. 8. Remote tobacco abuse.  DISCHARGE MEDICATIONS:  1. Losartan 50 mg 1 tab daily. 2. Pradaxa 150 mg 1 tab b.i.d. 3. Gabapentin 300 one tab b.i.d. 4. Amitriptyline 25 two tabs at bedtime. 5. Citalopram 20 one tab daily. 6. Please note change in dose and type of metoprolol.  The patient was     placed on metoprolol 25 XL.  He was transitioned from metoprolol 50     b.i.d. to this. 7. Diltiazem 300 has been held. 8. Metformin 500 mg q.p.m. 9. Lipitor 40 mg 1 tab daily. 10.Aspirin 81 mg daily. 11.Glipizide 5 mg 0.5 tab q.a.m.  PERTINENT IMAGING STUDIES:  Chest x-ray, Feb 24, 2011, showed no active cardiopulmonary disease.  Briefly, this is a 65 year old male with history of hypertension and other medical issues including atrial fibrillation.  The patient states he was at home and started feeling dizzy and felt accusing and passed out.  The whole area was spinning with iron.  It seemed like they were cut and being drawn.  When I took his blood pressure, it was 70/40 and in the ED, his blood pressure gone to 80/50.  He responded well to 1 L of bolus IV fluids and his blood pressure went to 94/49.  His initial EKG showed normal sinus rhythm with heart rate of 85, possible LVH, temperature was 98.7,  blood pressures were as above, respirations of 12, and O2 sats were 98%.  PHYSICAL EXAMINATION:  HEART:  Initially, he had S1 and S2.  No audible murmur. GENERAL:  He was alert and mentating well, in no distress. MUSCULOSKELETAL:  No joint swelling or tenderness. LUNGS:  Good air entry equally.  PERTINENT LABORATORY DATA:  On admission; WBC 12.5, lactic acid 2.5, BUN/creatinine were 10/1.92.  Chest x-ray as above.  Glucose 200.  Rest of labs essentially normal.  HOSPITAL COURSE: 1. According to history of syncope, it was thought that his     presyncopal/dizziness issues were in relation to his multiple     antihypertensives, which include nodal blocking agents.  He     completely was discontinued off his diltiazem after a brief trial     of short-acting diltiazem as he was still feeling woozy.  I have     placed him on low-dose metoprolol on the morning of discharge at 25     mg XL.  If the patient responses favorably (the patient's heart  rate still was in 110-115 range), although he was in sinus rhythm     and his heart rate responses favorably and he does not feel woozy     or uncomfortable, more likely we would discharge him home.  Please     note, he did have a echocardiogram at this hospitalization which     showed an EF of 60% to 65%, no regional wall motion abnormalities     and mild concentric hypertrophy, otherwise essentially the patient     has been stable. 2. Diabetes mellitus.  His blood pressure exhibited excellent control.     A1c of 6.1.  Blood pressure ranges on day of discharge and     throughout hospitalization were between the 100-150s maximum. 3. Hyperlipidemia.  His LDL was 87.  The patient was transiently on     Crestor.  We will continue his Lipitor. 4. Paroxysmal AFib.  The patient has a CHADS score of at least 2.  He     is on aspirin and Pradaxa 150 b.i.d. He did not have any symptoms     of CHF or any shortness of breath.  I think he just needs      medication management as an outpatient by     his primary care physician and his cardiologist.  The     patient understands same and will follow up with them. 5. Undetermined leukocytosis.  His white count was 12.5 initially and     it dropped to 8.5 during hospitalization.  This is felt likely     based acute stress response secondary to the hypotension he     sustained. 6. Acute kidney.  He had stage I kidney disease, last creatinine was     1.13 down from his prior. 7. Hypertension.  The patient will likely go home on metoprolol     succinate 25 XL and his losartan 50.  We will hold his other     medications for now and review him.  He will need to be reviewed     and close followup by his primary care physician. 8. Depression and also diabetic neuropathy.  The patient will continue     amitriptyline 25, citalopram 20, and gabapentin as an outpatient.  The patient is stable on day of discharge.  On morning of discharge, he was talking well and walking around without issue.  Physical therapy had seen him and assessed him as not needing any care from this standpoint. The patient's medications may be reconciled prior to discharge, just pending on his blood pressure and hypotension, but the patient is very stable on morning of discharge.  His vitals on discharge were; temperature 92, pulse 83, respirations 20, blood pressure 150-153/94- 101.          ______________________________ [name], MD    JS/MEDQ  D:  02/27/2011  T:  02/27/2011  Job:  [id number]  Electronically Signed by [name] MD on 03/20/2011 01:09:42 PM

## 2011-04-22 ENCOUNTER — Encounter: Payer: Self-pay | Admitting: Cardiovascular Disease

## 2011-06-18 ENCOUNTER — Inpatient Hospital Stay (HOSPITAL_COMMUNITY)
Admission: EM | Admit: 2011-06-18 | Discharge: 2011-06-20 | DRG: 310 | Disposition: A | Discharge status: Home / Self Care | Payer: Medicare Other | Source: Ambulatory Visit | Attending: Internal Medicine | Admitting: Internal Medicine

## 2011-06-18 ENCOUNTER — Emergency Department (HOSPITAL_COMMUNITY): Payer: Medicare Other

## 2011-06-18 DIAGNOSIS — Z23 Encounter for immunization: Secondary | ICD-10-CM

## 2011-06-18 DIAGNOSIS — I4891 Unspecified atrial fibrillation: Principal | ICD-10-CM | POA: Diagnosis present

## 2011-06-18 DIAGNOSIS — Z7982 Long term (current) use of aspirin: Secondary | ICD-10-CM

## 2011-06-18 DIAGNOSIS — E1149 Type 2 diabetes mellitus with other diabetic neurological complication: Secondary | ICD-10-CM | POA: Diagnosis present

## 2011-06-18 DIAGNOSIS — Z91199 Patient's noncompliance with other medical treatment and regimen due to unspecified reason: Secondary | ICD-10-CM

## 2011-06-18 DIAGNOSIS — Z9119 Patient's noncompliance with other medical treatment and regimen: Secondary | ICD-10-CM

## 2011-06-18 DIAGNOSIS — E1142 Type 2 diabetes mellitus with diabetic polyneuropathy: Secondary | ICD-10-CM | POA: Diagnosis present

## 2011-06-18 DIAGNOSIS — E876 Hypokalemia: Secondary | ICD-10-CM | POA: Diagnosis present

## 2011-06-18 DIAGNOSIS — Z79899 Other long term (current) drug therapy: Secondary | ICD-10-CM

## 2011-06-18 DIAGNOSIS — F329 Major depressive disorder, single episode, unspecified: Secondary | ICD-10-CM | POA: Diagnosis present

## 2011-06-18 DIAGNOSIS — E785 Hyperlipidemia, unspecified: Secondary | ICD-10-CM | POA: Diagnosis present

## 2011-06-18 DIAGNOSIS — R82998 Other abnormal findings in urine: Secondary | ICD-10-CM | POA: Diagnosis present

## 2011-06-18 DIAGNOSIS — F3289 Other specified depressive episodes: Secondary | ICD-10-CM | POA: Diagnosis present

## 2011-06-18 DIAGNOSIS — I10 Essential (primary) hypertension: Secondary | ICD-10-CM | POA: Diagnosis present

## 2011-06-18 LAB — COMPREHENSIVE METABOLIC PANEL
ALT: 19 U/L (ref 0–53)
AST: 36 U/L (ref 0–37)
Albumin: 3.3 g/dL — ABNORMAL LOW (ref 3.5–5.2)
Alkaline Phosphatase: 146 U/L — ABNORMAL HIGH (ref 39–117)
BUN: 13 mg/dL (ref 6–23)
CO2: 27 mEq/L (ref 19–32)
Calcium: 9.8 mg/dL (ref 8.4–10.5)
Chloride: 100 mEq/L (ref 96–112)
Creatinine, Ser: 0.77 mg/dL (ref 0.50–1.35)
GFR calc Af Amer: >60 mL/min (ref >60)
GFR calc non Af Amer: >60 mL/min (ref >60)
Glucose, Bld: 128 mg/dL — ABNORMAL HIGH (ref 70–99)
Potassium: 4.3 mEq/L (ref 3.5–5.1)
Sodium: 138 mEq/L (ref 135–145)
Total Bilirubin: 2.6 mg/dL — ABNORMAL HIGH (ref 0.3–1.2)
Total Protein: 7.6 g/dL (ref 6.0–8.3)

## 2011-06-18 LAB — DIFFERENTIAL
Basophils Absolute: 0 10*3/uL (ref 0.0–0.1)
Basophils Relative: 0 % (ref 0–1)
Eosinophils Absolute: 0 10*3/uL (ref 0.0–0.7)
Eosinophils Relative: 0 % (ref 0–5)
Lymphocytes Relative: 24 % (ref 12–46)
Lymphs Abs: 1.9 10*3/uL (ref 0.7–4.0)
Monocytes Absolute: 0.9 10*3/uL (ref 0.1–1.0)
Monocytes Relative: 12 % (ref 3–12)
Neutro Abs: 5 10*3/uL (ref 1.7–7.7)
Neutrophils Relative %: 64 % (ref 43–77)

## 2011-06-18 LAB — CARDIAC PANEL(CRET KIN+CKTOT+MB+TROPI)
CK, MB: 5.9 ng/mL — ABNORMAL HIGH (ref 0.3–4.0)
CK, MB: 6.2 ng/mL (ref 0.3–4.0)
Relative Index: 3.9 — ABNORMAL HIGH (ref 0.0–2.5)
Relative Index: 3.6 — ABNORMAL HIGH (ref 0.0–2.5)
Total CK: 152 U/L (ref 7–232)
Total CK: 171 U/L (ref 7–232)
Troponin I: <0.3 ng/mL (ref <0.30)
Troponin I: <0.3 ng/mL (ref <0.30)

## 2011-06-18 LAB — URINALYSIS, ROUTINE W REFLEX MICROSCOPIC
Glucose, UA: NEGATIVE mg/dL
Hgb urine dipstick: NEGATIVE
Ketones, ur: 15 mg/dL — AB
Nitrite: POSITIVE — AB
Protein, ur: 30 mg/dL — AB
Specific Gravity, Urine: 1.022 (ref 1.005–1.030)
Urobilinogen, UA: 4 mg/dL — ABNORMAL HIGH (ref 0.0–1.0)
pH: 7 (ref 5.0–8.0)

## 2011-06-18 LAB — CBC
HCT: 42.7 % (ref 39.0–52.0)
Hemoglobin: 14.7 g/dL (ref 13.0–17.0)
MCH: 33.6 pg (ref 26.0–34.0)
MCHC: 34.4 g/dL (ref 30.0–36.0)
MCV: 97.5 fL (ref 78.0–100.0)
Platelets: 159 10*3/uL (ref 150–400)
RBC: 4.38 MIL/uL (ref 4.22–5.81)
RDW: 13.7 % (ref 11.5–15.5)
WBC: 7.8 10*3/uL (ref 4.0–10.5)

## 2011-06-18 LAB — GLUCOSE, CAPILLARY
Glucose-Capillary: 148 mg/dL — ABNORMAL HIGH (ref 70–99)
Glucose-Capillary: 128 mg/dL — ABNORMAL HIGH (ref 70–99)

## 2011-06-18 LAB — URINE MICROSCOPIC-ADD ON

## 2011-06-18 LAB — POCT I-STAT TROPONIN I: Troponin i, poc: 0.01 ng/mL (ref 0.00–0.08)

## 2011-06-18 LAB — PRO B NATRIURETIC PEPTIDE: Pro B Natriuretic peptide (BNP): 1490 pg/mL — ABNORMAL HIGH (ref 0–125)

## 2011-06-18 LAB — D-DIMER, QUANTITATIVE (NOT AT ARMC): D-Dimer, Quant: 0.29 ug/mL-FEU (ref 0.00–0.48)

## 2011-06-19 LAB — APTT: aPTT: 61 seconds — ABNORMAL HIGH (ref 24–37)

## 2011-06-19 LAB — COMPREHENSIVE METABOLIC PANEL
ALT: 17 U/L (ref 0–53)
AST: 31 U/L (ref 0–37)
Albumin: 3.2 g/dL — ABNORMAL LOW (ref 3.5–5.2)
Alkaline Phosphatase: 129 U/L — ABNORMAL HIGH (ref 39–117)
BUN: 18 mg/dL (ref 6–23)
CO2: 27 mEq/L (ref 19–32)
Calcium: 9.2 mg/dL (ref 8.4–10.5)
Chloride: 102 mEq/L (ref 96–112)
Creatinine, Ser: 0.82 mg/dL (ref 0.50–1.35)
GFR calc Af Amer: >60 mL/min (ref >60)
GFR calc non Af Amer: >60 mL/min (ref >60)
Glucose, Bld: 100 mg/dL — ABNORMAL HIGH (ref 70–99)
Potassium: 3.2 mEq/L — ABNORMAL LOW (ref 3.5–5.1)
Sodium: 137 mEq/L (ref 135–145)
Total Bilirubin: 1.6 mg/dL — ABNORMAL HIGH (ref 0.3–1.2)
Total Protein: 7.1 g/dL (ref 6.0–8.3)

## 2011-06-19 LAB — LIPID PANEL
Cholesterol: 237 mg/dL — ABNORMAL HIGH (ref 0–200)
HDL: 92 mg/dL (ref >39)
LDL Cholesterol: 127 mg/dL — ABNORMAL HIGH (ref 0–99)
Total CHOL/HDL Ratio: 2.6 RATIO
Triglycerides: 92 mg/dL (ref <150)
VLDL: 18 mg/dL (ref 0–40)

## 2011-06-19 LAB — CBC
HCT: 39.4 % (ref 39.0–52.0)
Hemoglobin: 13.8 g/dL (ref 13.0–17.0)
MCH: 33.8 pg (ref 26.0–34.0)
MCHC: 35 g/dL (ref 30.0–36.0)
MCV: 96.6 fL (ref 78.0–100.0)
Platelets: 145 10*3/uL — ABNORMAL LOW (ref 150–400)
RBC: 4.08 MIL/uL — ABNORMAL LOW (ref 4.22–5.81)
RDW: 13.5 % (ref 11.5–15.5)
WBC: 8.2 10*3/uL (ref 4.0–10.5)

## 2011-06-19 LAB — CARDIAC PANEL(CRET KIN+CKTOT+MB+TROPI)
CK, MB: 7 ng/mL (ref 0.3–4.0)
Relative Index: 3.6 — ABNORMAL HIGH (ref 0.0–2.5)
Total CK: 196 U/L (ref 7–232)
Troponin I: <0.3 ng/mL (ref <0.30)

## 2011-06-19 LAB — GLUCOSE, CAPILLARY
Glucose-Capillary: 128 mg/dL — ABNORMAL HIGH (ref 70–99)
Glucose-Capillary: 108 mg/dL — ABNORMAL HIGH (ref 70–99)
Glucose-Capillary: 107 mg/dL — ABNORMAL HIGH (ref 70–99)
Glucose-Capillary: 100 mg/dL — ABNORMAL HIGH (ref 70–99)

## 2011-06-19 LAB — HEMOGLOBIN A1C
Hgb A1c MFr Bld: 5.8 % — ABNORMAL HIGH (ref <5.7)
Mean Plasma Glucose: 120 mg/dL — ABNORMAL HIGH (ref <117)

## 2011-06-19 LAB — PROTIME-INR
INR: 1.68 — ABNORMAL HIGH (ref 0.00–1.49)
Prothrombin Time: 20.1 seconds — ABNORMAL HIGH (ref 11.6–15.2)

## 2011-06-19 LAB — DIFFERENTIAL
Basophils Absolute: 0 10*3/uL (ref 0.0–0.1)
Basophils Relative: 0 % (ref 0–1)
Eosinophils Absolute: 0 10*3/uL (ref 0.0–0.7)
Eosinophils Relative: 0 % (ref 0–5)
Lymphocytes Relative: 24 % (ref 12–46)
Lymphs Abs: 2 10*3/uL (ref 0.7–4.0)
Monocytes Absolute: 0.8 10*3/uL (ref 0.1–1.0)
Monocytes Relative: 9 % (ref 3–12)
Neutro Abs: 5.4 10*3/uL (ref 1.7–7.7)
Neutrophils Relative %: 66 % (ref 43–77)

## 2011-06-19 LAB — TSH
TSH: 1.362 u[IU]/mL (ref 0.350–4.500)
TSH: 1.318 u[IU]/mL (ref 0.350–4.500)

## 2011-06-19 LAB — PRO B NATRIURETIC PEPTIDE: Pro B Natriuretic peptide (BNP): 556.9 pg/mL — ABNORMAL HIGH (ref 0–125)

## 2011-06-19 LAB — MAGNESIUM: Magnesium: 1.7 mg/dL (ref 1.5–2.5)

## 2011-06-19 LAB — PHOSPHORUS: Phosphorus: 2.8 mg/dL (ref 2.3–4.6)

## 2011-06-19 NOTE — H&P (Signed)
NAME:  Darrell Horne, Darrell Horne NO.:  [id number]  MEDICAL RECORD NO.:  [id number]  LOCATION:  [facility]                         FACILITY:  [facility]  PHYSICIAN:  Kathlen Mody, MD       DATE OF BIRTH:  [birth year]  DATE OF ADMISSION:  06/18/2011 DATE OF DISCHARGE:                             HISTORY & PHYSICAL   PRIMARY CARE PHYSICIAN:  Dr. Nelson Chimes at [facility], none at [facility]  CHIEF COMPLAINT:  Shortness of breath over the last 3 days.  HISTORY OF PRESENT ILLNESS:  This is a 65 year old gentleman with history of hypertension, paroxysmal atrial fibrillation on Pradaxa, diabetes, hypertension, hyperlipidemia, and depression came in for worsening shortness of breath over the last 3 days.  He does not have a PCP in [place] at this time, hence he ran out of his medications beta- blockers and Pradaxa among other medications.  He was recently admitted and discharged on Feb 27, 2011, from our service for near syncope.  At this time, the patient's shortness of breath has improved, but when it does occur, it is associated with some left-sided chest pain, sharp, nonradiating, lasting for a few sec associated with some nausea and diaphoresis.  The patient denies any syncopal episodes in the last 3 months.  The patient does have 2-pillow orthopnea and denies PND. Denies any pedal edema.  Denies fever, chills, or cough.  Denies any vomiting, diarrhea, or abdominal pain.  Denies any headache or blurry vision.  The patient also has chronic bilateral lower extremity pain and attributes it to diabetic neuropathy.  The patient denies any focal weakness in his extremities.  PAST MEDICAL HISTORY:  Paroxysmal atrial fibrillation on Pradaxa, ran out of medications at this time; hypertension, hyperlipidemia, diabetes, diabetic neuropathy, and history of hyponatremia.  PAST SURGICAL HISTORY:  Appendectomy, cholecystectomy, and hernia repair.  ALLERGIES:  No known drug allergies.  MEDICATIONS:   The patient is on, 1. Aspirin 81. 2. Amitriptyline 25 mg two tabs at bedtime. 3. Atorvastatin 40 mg daily. 4. Celexa 20 mg daily. 5. Gabapentin 30 mg twice a day. 6. Glipizide 0.5 mg daily. 7. Losartan 50 mg daily. 8. Metformin 500 mg daily. 9. Metoprolol 25 mg XL. 10.Pradaxa 150 mg twice a day.  SOCIAL HISTORY:  The patient lives in [place], he is an ex-smoker. Takes alcohol socially.  Denies any recreational drug use.  FAMILY HISTORY:  Hypertension.  PHYSICAL EXAMINATION:  VITAL SIGNS:  Temp of 97.7, when he came in his pulse rate was in 120s, his pulse rate at this time is ranging from 100- 110, blood pressure of 110/80, respiratory rate of 20, saturating 98% on room air. GENERAL:  He is alert, afebrile, and oriented x3.  Comfortable, no acute distress. HEENT:  Pupils reacting to light and accommodation.  There is no No JVD. Moist mucous membranes. CARDIOVASCULAR:  S1, S2 heard.  No murmurs or rubs.  Irregular. RESPIRATORY:  Chest clear to auscultation bilaterally. ABDOMEN:  Soft, nontender, nondistended.  Bowel sounds are heard. EXTREMITIES:  No pedal edema.  Cyanosis or clubbing. NEUROLOGIC:  Nonfocal at this time.  DIAGNOSTIC STUDIES:  The patient had a chest x-ray, which showed no active lung disease,  borderline cardiomegaly.  EKG shows atrial fibrillation with RVR in 120s.  PERTINENT LABORATORY DATA:  The patient had a CBC done which was within normal limits.  Point-of-care enzymes negative x1, D-dimers negative. Comprehensive metabolic panel significant for a glucose of 128, alk phos of 146, and total bili of 2.6.  Pro-BNP is 1490, has doubled since the last admission, last admission pro-BNP was 504.  Urinalysis shows positive nitrates and small leukocytes with a few bacteria.  ASSESSMENT AND PLAN: 60. A 65 year old gentleman with history of paroxysmal atrial     fibrillation on Pradaxa and Toprol, came in complaining of     shortness of breath.  In ED, he was  found to be in atrial     fibrillation with RVR.  He was started on IV Cardizem drip to     control the rate.  His heart rate has improved from 200s to 100s at     this time.  We will also start on p.o. metoprolol 25 mg b.i.d., get     3 sets of cardiac enzymes q.6 h.  We have a recent 2-D     echocardiogram from May 2012, which shows good left ventricular     systolic function with an ejection fraction of 60% to 65% with wall     normal wall motion and no regional wall abnormalities.  Right     ventricular systolic function was also normal.  If his rate does     not improve in the next 24 hours, we will get a Cardiology consult     from [facility] as he saw Dr. Anne Fu previously for his atrial     fibrillation.  He will also be started on his Pradaxa which he ran     out of 150 mg twice a day for anticoagulation. 2. Diabetes.  His last hemoglobin A1c was 6.1.  We will put him on     sliding scale insulin for his oral hypoglycemic medications at this     time. 3. Hypertension.  His blood pressure is borderline at this time.  He     is on IV Cardizem drip, we will hold his losartan for now.  It can     be restarted later on after his heart rate controls. 4. Diabetic neuropathy.  Continue with his gabapentin. 5. Abnormal urinalysis.  The patient does not have any urinary     symptoms, but he showed positive nitrates and small leukocytes with     a few bacteria.  We will send his urine for culture.  He is     afebrile with no leukocytosis,     so we will hold off on starting the antibiotics at this time. 6. Isolated elevation of alkaline phosphatase.  We will continue to     monitor it. 7. DVT prophylaxis.  The patient is on Pradaxa for anticoagulation.          ______________________________ Kathlen Mody, MD     VA/MEDQ  D:  06/18/2011  T:  06/18/2011  Job:  [id number]  Electronically Signed by Kathlen Mody MD on 06/19/2011 09:10:01 PM

## 2011-06-20 LAB — URINE CULTURE
Colony Count: 8000
Culture  Setup Time: 201209082232

## 2011-06-20 LAB — MAGNESIUM: Magnesium: 1.9 mg/dL (ref 1.5–2.5)

## 2011-06-20 LAB — BASIC METABOLIC PANEL
BUN: 15 mg/dL (ref 6–23)
CO2: 28 mEq/L (ref 19–32)
Calcium: 9.6 mg/dL (ref 8.4–10.5)
Chloride: 101 mEq/L (ref 96–112)
Creatinine, Ser: 0.61 mg/dL (ref 0.50–1.35)
GFR calc Af Amer: >60 mL/min (ref >60)
GFR calc non Af Amer: >60 mL/min (ref >60)
Glucose, Bld: 115 mg/dL — ABNORMAL HIGH (ref 70–99)
Potassium: 3.3 mEq/L — ABNORMAL LOW (ref 3.5–5.1)
Sodium: 136 mEq/L (ref 135–145)

## 2011-06-20 LAB — GLUCOSE, CAPILLARY: Glucose-Capillary: 91 mg/dL (ref 70–99)

## 2011-07-21 NOTE — Discharge Summary (Signed)
NAME:  Darrell Horne, Darrell Horne NO.:  [id number]  MEDICAL RECORD NO.:  [id number]  LOCATION:  4732                         FACILITY:  [facility]  PHYSICIAN:  Altha Harm, MDDATE OF BIRTH:  1946-06-15  DATE OF ADMISSION:  06/18/2011 DATE OF DISCHARGE:  06/20/2011                              DISCHARGE SUMMARY   DISCHARGE DISPOSITION:  Home.  FINAL DISCHARGE DIAGNOSES: 1. Atrial fibrillation with rapid ventricular response secondary to     medication noncompliance. 2. Diabetes type 2. 3. Pyuria, urine culture negative. 4. Hypertension, controlled. 5. Hypokalemia.  SECONDARY DIAGNOSES: 1. Hyperlipidemia. 2. Diabetic neuropathy.  DISCHARGE MEDICATIONS: 1. Celexa 20 mg p.o. daily. 2. Potassium chloride 20 mEq p.o. daily. 3. Amitriptyline 50 mg p.o. q.h.s. 4. Aspirin 81 p.o. daily. 5. Gabapentin 300 mg p.o. b.i.d. 6. Glipizide 2.5 mg p.o. daily. 7. Lipitor 40 mg p.o. daily. 8. Losartan 50 mg p.o. daily. 9. Metformin XR 500 mg p.o. at bedtime. 10.Metoprolol XL 25 mg p.o. daily. 11.Pradaxa 150 mg b.i.d.  CONSULTANTS:  None.  PROCEDURES:  None.  DIAGNOSTIC STUDIES:  Portable chest x-ray, which shows no active lung disease, and there is borderline cardiomegaly.  PERTINENT LABORATORY STUDIES:  At the time of discharge, the patient has a white blood cell count of 8.2, hemoglobin of 30.8, hematocrit of 39.4, platelet count of 145, sodium 137, potassium 3.2, chloride 102, bicarb 27, BUN 18, creatinine 0.8.  Cholesterol total 237, LDL 127.  TSH 1.362.  PRIMARY CARE PHYSICIAN:  Dr. Kathi Ludwig, [place], [place]; however, the patient states that he will not go back to see Dr. Kathi Ludwig, but he has not yet secured an appointment with another practitioner.  CODE STATUS:  Full code.  ALLERGIES:  NO KNOWN DRUG ALLERGIES.  CHIEF COMPLAINT:  Shortness of breath.  HISTORY OF PRESENT ILLNESS:  Please refer to the H&P by Dr. Blake Divine for details of the HPI; however, in short this is  a 65 year old gentleman with paroxysmal atrial fibrillation, on Pradaxa, who was discharged from the hospital on May 30.  The patient was instructed and is as noted in the discharge instructions to follow up with his primary care physician for further titration of his medications.  The patient admits that he was decided not going back to his physician after he completes the medications.  Additionally, he states that he saw commercials about Pradaxa on television and he decided to stop the medication at that point.  The patient then went into paroxysmal atrial fibrillation and came into the hospital with atrial fibrillation and rapid ventricular response.  [facility] were called for further evaluation and management of the patient. 1. Paroxysmal atrial fibrillation with rapid ventricular response.     The patient presents to the hospital with a rapid ventricular rate     and atrial fibrillation; however, the patient was initially treated     with Cardizem IV.  The rate became controlled on the Cardizem and     the patient was transitioned over to metoprolol p.o. b.i.d..  The     patient is being discharged home on metoprolol XL 25 mg p.o. daily.     I have spent a significant amount of  time going over medications     with this patient and his wife, and I impressed upon the importance     of not just abruptly stopping these medications as it could lead to     consequences up to including stroke or death.  I have also asked     the patient to discuss with his doctors about stoppage of any     medications that was started surrounding a significant enough event     that required hospitalization. Quite frankly, I do not believe     based on the patient and his wife's response they are going to be     compliant with what would ask them to do as they have their own     ideas about how and when they should take medications. 2. Hypokalemia.  The patient is mildly hyperkalemic.  He has  refused a     repeat drop in his potassium and magnesium.  The patient and his     wife commented that they can buy potassium over-the-counter and     take it.  I have explained to them the importance of these     electrolytes in controlling arrhythmia as well as in function of     the heart.  To help to circumvent any consequence of this, we made     an appointment and have them to follow up with Dr. Mikeal Hawthorne in 2 days,     at which time they should probably have their electrolytes checked     for the potassium and magnesium.  In the meantime, I have given the     patient's prescription for potassium 20 mEq to take on a daily     basis. 3. Diabetes type 2.  The patient had a hemoglobin A1c in May which was     6.1 which showed the control, and this was not repeated at this     time.  The patient is to continue on his glipizide. 4. Diabetic neuropathy.  The patient is continued on gabapentin and     amitriptyline.  Otherwise, the patient has been stable.  AT THE TIME OF DISCHARGE:  The physical examination is as follows: VITAL SIGNS:  Temperature is 98.1, heart rate 58, respiratory rate 18, blood pressure 101/62, O2 sats are 96% on room air. HEENT:  He is normocephalic and atraumatic.  Pupils equally round and reactive to light and accommodation.  Extraocular movements are intact. Oropharynx is moist.  No exudate, erythema, or lesions are noted. NECK:  Trachea is midline.  No masses, no thyromegaly, no JVD, no carotid bruit. LUNGS: Clear to auscultation.  No wheezing or rhonchi noted. CARDIOVASCULAR:  He has got a normal S1 and S2.  No murmurs, rubs, or gallops noted. ABDOMEN:  Obese, soft, nontender, nondistended.  No masses.  No hepatosplenomegaly.  DIETARY RESTRICTIONS:  The patient should be on a diabetic heart-healthy diet.  FOLLOW-UP:  The patient had an appointment scheduled follow-up with Dr. Mikeal Hawthorne on September 12, at 3:00 p.m.  FOLLOW-UP LABS:  He should have his  electrolytes and magnesium checked at that time with a follow-up appointment with Dr. Mikeal Hawthorne.  Additionally, this patient is at great risk for readmission due to medication noncompliance and particularly noncompliance with medical instructions.  PHYSICAL RESTRICTIONS:  There is no restriction to activity at this time.  Total time for this discharge process including face-to-face time, approximately 40 minutes.     Altha Harm, MD  MAM/MEDQ  D:  06/20/2011  T:  06/20/2011  Job:  [id number]  cc:   Lonia Blood, M.D.  Electronically Signed by Marthann Schiller MD on 07/21/2011 07:20:18 AM

## 2011-08-08 ENCOUNTER — Other Ambulatory Visit: Payer: Self-pay | Admitting: Internal Medicine

## 2012-01-31 ENCOUNTER — Encounter (HOSPITAL_COMMUNITY): Payer: Self-pay | Admitting: *Deleted

## 2012-01-31 ENCOUNTER — Emergency Department (HOSPITAL_COMMUNITY)
Admission: EM | Admit: 2012-01-31 | Discharge: 2012-01-31 | Disposition: A | Discharge status: Home / Self Care | Payer: Medicare Other | Attending: Emergency Medicine | Admitting: Emergency Medicine

## 2012-01-31 ENCOUNTER — Emergency Department (HOSPITAL_COMMUNITY): Payer: Medicare Other

## 2012-01-31 DIAGNOSIS — R0602 Shortness of breath: Secondary | ICD-10-CM | POA: Insufficient documentation

## 2012-01-31 DIAGNOSIS — R079 Chest pain, unspecified: Secondary | ICD-10-CM | POA: Insufficient documentation

## 2012-01-31 DIAGNOSIS — M79602 Pain in left arm: Secondary | ICD-10-CM

## 2012-01-31 DIAGNOSIS — E119 Type 2 diabetes mellitus without complications: Secondary | ICD-10-CM | POA: Insufficient documentation

## 2012-01-31 DIAGNOSIS — E785 Hyperlipidemia, unspecified: Secondary | ICD-10-CM | POA: Insufficient documentation

## 2012-01-31 DIAGNOSIS — I4891 Unspecified atrial fibrillation: Secondary | ICD-10-CM | POA: Insufficient documentation

## 2012-01-31 DIAGNOSIS — M79609 Pain in unspecified limb: Secondary | ICD-10-CM | POA: Insufficient documentation

## 2012-01-31 DIAGNOSIS — R110 Nausea: Secondary | ICD-10-CM | POA: Insufficient documentation

## 2012-01-31 DIAGNOSIS — R209 Unspecified disturbances of skin sensation: Secondary | ICD-10-CM | POA: Insufficient documentation

## 2012-01-31 DIAGNOSIS — I10 Essential (primary) hypertension: Secondary | ICD-10-CM | POA: Insufficient documentation

## 2012-01-31 LAB — BASIC METABOLIC PANEL
BUN: 6 mg/dL (ref 6–23)
CO2: 25 mEq/L (ref 19–32)
Calcium: 9.2 mg/dL (ref 8.4–10.5)
Chloride: 101 mEq/L (ref 96–112)
Creatinine, Ser: 0.71 mg/dL (ref 0.50–1.35)
GFR calc Af Amer: >90 mL/min (ref >90)
GFR calc non Af Amer: >90 mL/min (ref >90)
Glucose, Bld: 115 mg/dL — ABNORMAL HIGH (ref 70–99)
Potassium: 4 mEq/L (ref 3.5–5.1)
Sodium: 137 mEq/L (ref 135–145)

## 2012-01-31 LAB — CBC
HCT: 41.2 % (ref 39.0–52.0)
Hemoglobin: 14.1 g/dL (ref 13.0–17.0)
MCH: 34.2 pg — ABNORMAL HIGH (ref 26.0–34.0)
MCHC: 34.2 g/dL (ref 30.0–36.0)
MCV: 100 fL (ref 78.0–100.0)
Platelets: 242 10*3/uL (ref 150–400)
RBC: 4.12 MIL/uL — ABNORMAL LOW (ref 4.22–5.81)
RDW: 13.8 % (ref 11.5–15.5)
WBC: 8.1 10*3/uL (ref 4.0–10.5)

## 2012-01-31 LAB — POCT I-STAT TROPONIN I: Troponin i, poc: 0 ng/mL (ref 0.00–0.08)

## 2012-01-31 LAB — PRO B NATRIURETIC PEPTIDE: Pro B Natriuretic peptide (BNP): 227.1 pg/mL — ABNORMAL HIGH (ref 0–125)

## 2012-01-31 LAB — TROPONIN I: Troponin I: <0.3 ng/mL (ref <0.30)

## 2012-01-31 MED ORDER — ASPIRIN 325 MG PO TABS: 325.0000 mg | ORAL_TABLET | ORAL | Status: DC

## 2012-01-31 NOTE — Discharge Instructions (Signed)
Pain of Unknown Etiology (Pain Without a Known Cause) You have come to your caregiver because of pain. Pain can occur in any part of the body. Often there is not a definite cause. If your laboratory (blood or urine) work was normal and x-rays or other studies were normal, your caregiver may treat you without knowing the cause of the pain. An example of this is the headache. Most headaches are diagnosed by taking a history. This means your caregiver asks you questions about your headaches. Your caregiver determines a treatment based on your answers. Usually testing done for headaches is normal. Often testing is not done unless there is no response to medications. Regardless of where your pain is located today, you can be given medications to make you comfortable. If no physical cause of pain can be found, most cases of pain will gradually leave as suddenly as they came.  If you have a painful condition and no reason can be found for the pain, It is importantthat you follow up with your caregiver. If the pain becomes worse or does not go away, it may be necessary to repeat tests and look further for a possible cause.  Only take over-the-counter or prescription medicines for pain, discomfort, or fever as directed by your caregiver.   For the protection of your privacy, test results can not be given over the phone. Make sure you receive the results of your test. Ask as to how these results are to be obtained if you have not been informed. It is your responsibility to obtain your test results.   You may continue all activities unless the activities cause more pain. When the pain lessens, it is important to gradually resume normal activities. Resume activities by beginning slowly and gradually increasing the intensity and duration of the activities or exercise. During periods of severe pain, bed-rest may be helpful. Lay or sit in any position that is comfortable.   Ice used for acute (sudden) conditions may be  effective. Use a large plastic bag filled with ice and wrapped in a towel. This may provide pain relief.   See your caregiver for continued problems. They can help or refer you for exercises or physical therapy if necessary.  If you were given medications for your condition, do not drive, operate machinery or power tools, or sign legal documents for 24 hours. Do not drink alcohol, take sleeping pills, or take other medications that may interfere with treatment. See your caregiver immediately if you have pain that is becoming worse and not relieved by medications. Document Released: 06/21/2001 Document Revised: 09/15/2011 Document Reviewed: 09/26/2005 [employer] Patient Information 2012 [employer]. 

## 2012-01-31 NOTE — ED Provider Notes (Signed)
History    66year-old with chest pain. Onset around 7:30 this morning. Constant since onset. Patient also reports some tingling in the left upper extremity and some mild nausea. No vomiting. No shortness of breath. No increased cough. No unusual leg pain or swelling. Denies history of blood clot. Denies history of coronary disease but he does have multiple risk factors including his age, obesity, hyperlipidemia, hypertension and diabetes.  CSN: [id number]  Arrival date & time 01/31/12  1505   First MD Initiated Contact with Patient 01/31/12 1720      Chief Complaint  Patient presents with  . Chest Pain  . Arm Pain  . Nausea    (Consider location/radiation/quality/duration/timing/severity/associated sxs/prior treatment) HPI  Past Medical History  Diagnosis Date  . PAF (paroxysmal atrial fibrillation)   . DM2 (diabetes mellitus, type 2)   . HTN (hypertension)   . HLD (hyperlipidemia)   . Chest pain   . Palpitations   . Diabetes mellitus     Past Surgical History  Procedure Date  . Total ankle arthroplasty   . Cholecystectomy   . Shoulder surgery   . Hernia repair   . Appendectomy     No family history on file.  History  Substance Use Topics  . Smoking status: Former Smoker    Quit date: 10/10/1981  . Smokeless tobacco: Not on file  . Alcohol Use: 4.2 oz/week    7 Glasses of wine per week      Review of Systems   Review of symptoms negative unless otherwise noted in HPI.   Allergies  Review of patient's allergies indicates no known allergies.  Home Medications   Current Outpatient Rx  Name Route Sig Dispense Refill  . AMITRIPTYLINE HCL 25 MG PO TABS Oral Take 50 mg by mouth at bedtime.     . ASPIRIN 81 MG PO TABS Oral Take 81 mg by mouth daily.      . ATORVASTATIN CALCIUM 40 MG PO TABS Oral Take 40 mg by mouth at bedtime.    Marland Kitchen NEPHRO-VITE 0.8 MG PO TABS Oral Take 0.8 mg by mouth at bedtime.      Marland Kitchen CITALOPRAM HYDROBROMIDE 20 MG PO TABS Oral Take 20 mg  by mouth daily.    Marland Kitchen DABIGATRAN ETEXILATE MESYLATE 150 MG PO CAPS Oral Take 150 mg by mouth every 12 (twelve) hours.    Marland Kitchen FOLIC ACID 800 MCG PO TABS Oral Take 800 mcg by mouth daily.    Marland Kitchen GABAPENTIN 300 MG PO CAPS Oral Take 300 mg by mouth 2 (two) times daily.     Marland Kitchen METFORMIN HCL ER 500 MG PO TB24 Oral Take 500 mg by mouth every evening.    Marland Kitchen METOPROLOL SUCCINATE ER 25 MG PO TB24 Oral Take 25 mg by mouth daily.    Marland Kitchen POTASSIUM CHLORIDE CRYS ER 20 MEQ PO TBCR Oral Take 20 mEq by mouth daily.      BP 138/84  Pulse 88  Temp(Src) 98.8 F (37.1 C) (Oral)  Resp 23  Ht 5\' 10"  (1.778 m)  Wt 242 lb (109.77 kg)  BMI 34.72 kg/m2  SpO2 100%  Physical Exam  Nursing note and vitals reviewed. Constitutional: He appears well-developed and well-nourished. No distress.       Laying in bed. Nad. Obese.  HENT:  Head: Normocephalic and atraumatic.  Eyes: Conjunctivae are normal. Right eye exhibits no discharge. Left eye exhibits no discharge.  Neck: Neck supple.  Cardiovascular: Normal rate, regular rhythm and normal heart sounds.  Exam reveals no gallop and no friction rub.   No murmur heard.      Pain not reproducible with palpation or range of motion.  Pulmonary/Chest: Effort normal and breath sounds normal. No respiratory distress. He exhibits no tenderness.  Abdominal: Soft. He exhibits no distension. There is no tenderness.  Musculoskeletal: He exhibits no edema and no tenderness.       Lower extremities symmetric as compared to each other. No calf tenderness. Negative Homan's. No palpable cords.   Neurological: He is alert.  Skin: Skin is warm and dry.  Psychiatric: He has a normal mood and affect. His behavior is normal. Thought content normal.    ED Course  Procedures (including critical care time)  Labs Reviewed  CBC - Abnormal; Notable for the following:    RBC 4.12 (*)    MCH 34.2 (*)    All other components within normal limits  BASIC METABOLIC PANEL - Abnormal; Notable for the  following:    Glucose, Bld 115 (*)    All other components within normal limits  PRO B NATRIURETIC PEPTIDE - Abnormal; Notable for the following:    Pro B Natriuretic peptide (BNP) 227.1 (*)    All other components within normal limits  POCT I-STAT TROPONIN I  TROPONIN I   Dg Chest 2 View  01/31/2012  *RADIOLOGY REPORT*  Clinical Data: Chest pain  CHEST - 2 VIEW  Comparison: 06/18/2011  Findings: The heart size and mediastinal contours are within normal limits.  Both lungs are clear.  The visualized skeletal structures are unremarkable.  IMPRESSION: Negative exam.  Original Report Authenticated By: Rosealee Albee, M.D.     1. Arm pain, left    EKG:  Rhythm: Normal sinus Rate: 84 Axis: Left axis deviation Intervals: Normal ST segments: Nonspecific ST changes. There is some T wave flattening inferiorly and laterally    MDM  [age]-year-old male with chest pain. Atypical given constant nature. 2 troponins were drawn and both are normal. Second troponin was drawn over 12 hours after the onset of symptoms. EKG non provocative. Chest x-ray is clear. Lungs are clear and oxygen saturations are 92-100% on room air.  Low suspicion for emergent etiology. Outpt fu for further evaluation.        Raeford Razor, MD 02/05/12 [phone]

## 2012-01-31 NOTE — ED Notes (Signed)
Patient with reports left arm tingling, pain, nausea and chest pain since 0730

## 2012-09-11 ENCOUNTER — Ambulatory Visit: Payer: Medicare Other | Attending: Surgery | Admitting: Physical Therapy

## 2012-09-11 DIAGNOSIS — R5381 Other malaise: Secondary | ICD-10-CM | POA: Insufficient documentation

## 2012-09-11 DIAGNOSIS — R269 Unspecified abnormalities of gait and mobility: Secondary | ICD-10-CM | POA: Insufficient documentation

## 2012-09-11 DIAGNOSIS — IMO0001 Reserved for inherently not codable concepts without codable children: Secondary | ICD-10-CM | POA: Insufficient documentation

## 2012-09-11 DIAGNOSIS — S88119A Complete traumatic amputation at level between knee and ankle, unspecified lower leg, initial encounter: Secondary | ICD-10-CM | POA: Insufficient documentation

## 2012-09-11 DIAGNOSIS — M6281 Muscle weakness (generalized): Secondary | ICD-10-CM | POA: Insufficient documentation

## 2012-10-15 ENCOUNTER — Ambulatory Visit: Payer: Medicare Other | Attending: Surgery | Admitting: Physical Therapy

## 2012-10-15 DIAGNOSIS — R5381 Other malaise: Secondary | ICD-10-CM | POA: Insufficient documentation

## 2012-10-15 DIAGNOSIS — S88119A Complete traumatic amputation at level between knee and ankle, unspecified lower leg, initial encounter: Secondary | ICD-10-CM | POA: Insufficient documentation

## 2012-10-15 DIAGNOSIS — M6281 Muscle weakness (generalized): Secondary | ICD-10-CM | POA: Insufficient documentation

## 2012-10-15 DIAGNOSIS — IMO0001 Reserved for inherently not codable concepts without codable children: Secondary | ICD-10-CM | POA: Insufficient documentation

## 2012-10-15 DIAGNOSIS — R269 Unspecified abnormalities of gait and mobility: Secondary | ICD-10-CM | POA: Insufficient documentation

## 2012-10-22 ENCOUNTER — Ambulatory Visit: Payer: Medicare Other | Admitting: Physical Therapy

## 2012-10-25 ENCOUNTER — Ambulatory Visit: Payer: Medicare Other | Admitting: Physical Therapy

## 2012-10-29 ENCOUNTER — Ambulatory Visit: Payer: Medicare Other | Admitting: Physical Therapy

## 2012-11-01 ENCOUNTER — Ambulatory Visit: Payer: Medicare Other | Admitting: Physical Therapy

## 2012-11-05 ENCOUNTER — Ambulatory Visit: Payer: Medicare Other | Admitting: Physical Therapy

## 2012-11-08 ENCOUNTER — Ambulatory Visit: Payer: Medicare Other | Admitting: Physical Therapy

## 2012-11-12 ENCOUNTER — Ambulatory Visit: Payer: Medicare Other | Attending: Surgery | Admitting: Physical Therapy

## 2012-11-12 DIAGNOSIS — R5381 Other malaise: Secondary | ICD-10-CM | POA: Insufficient documentation

## 2012-11-12 DIAGNOSIS — M6281 Muscle weakness (generalized): Secondary | ICD-10-CM | POA: Insufficient documentation

## 2012-11-12 DIAGNOSIS — IMO0001 Reserved for inherently not codable concepts without codable children: Secondary | ICD-10-CM | POA: Insufficient documentation

## 2012-11-12 DIAGNOSIS — R269 Unspecified abnormalities of gait and mobility: Secondary | ICD-10-CM | POA: Insufficient documentation

## 2012-11-12 DIAGNOSIS — S88119A Complete traumatic amputation at level between knee and ankle, unspecified lower leg, initial encounter: Secondary | ICD-10-CM | POA: Insufficient documentation

## 2012-11-15 ENCOUNTER — Ambulatory Visit: Payer: Medicare Other | Admitting: Physical Therapy

## 2012-11-19 ENCOUNTER — Ambulatory Visit: Payer: Medicare Other | Admitting: Physical Therapy

## 2012-11-22 ENCOUNTER — Ambulatory Visit: Payer: Medicare Other | Admitting: Physical Therapy

## 2012-11-26 ENCOUNTER — Ambulatory Visit: Payer: Medicare Other | Admitting: Physical Therapy

## 2012-11-29 ENCOUNTER — Ambulatory Visit: Payer: Medicare Other | Admitting: Physical Therapy

## 2012-12-04 ENCOUNTER — Ambulatory Visit: Payer: Medicare Other | Admitting: Physical Therapy

## 2012-12-06 ENCOUNTER — Ambulatory Visit: Payer: Medicare Other | Admitting: Physical Therapy

## 2012-12-10 ENCOUNTER — Ambulatory Visit: Payer: Medicare Other | Admitting: Physical Therapy

## 2012-12-11 ENCOUNTER — Ambulatory Visit: Payer: Medicare Other | Admitting: Physical Therapy

## 2012-12-13 ENCOUNTER — Ambulatory Visit: Payer: Medicare Other | Attending: Surgery | Admitting: Physical Therapy

## 2012-12-13 ENCOUNTER — Ambulatory Visit: Payer: Medicare Other | Admitting: Physical Therapy

## 2012-12-13 DIAGNOSIS — R269 Unspecified abnormalities of gait and mobility: Secondary | ICD-10-CM | POA: Insufficient documentation

## 2012-12-13 DIAGNOSIS — R5381 Other malaise: Secondary | ICD-10-CM | POA: Insufficient documentation

## 2012-12-13 DIAGNOSIS — M6281 Muscle weakness (generalized): Secondary | ICD-10-CM | POA: Insufficient documentation

## 2012-12-13 DIAGNOSIS — IMO0001 Reserved for inherently not codable concepts without codable children: Secondary | ICD-10-CM | POA: Insufficient documentation

## 2012-12-13 DIAGNOSIS — S88119A Complete traumatic amputation at level between knee and ankle, unspecified lower leg, initial encounter: Secondary | ICD-10-CM | POA: Insufficient documentation

## 2012-12-17 ENCOUNTER — Ambulatory Visit: Payer: Medicare Other | Admitting: Physical Therapy

## 2012-12-18 ENCOUNTER — Ambulatory Visit: Payer: Medicare Other | Admitting: Physical Therapy

## 2012-12-20 ENCOUNTER — Ambulatory Visit: Payer: Medicare Other | Admitting: Physical Therapy

## 2012-12-24 ENCOUNTER — Ambulatory Visit: Payer: Medicare Other | Admitting: Physical Therapy

## 2012-12-25 ENCOUNTER — Ambulatory Visit: Payer: Medicare Other | Admitting: Physical Therapy

## 2012-12-27 ENCOUNTER — Ambulatory Visit: Payer: Medicare Other | Admitting: Physical Therapy

## 2012-12-31 ENCOUNTER — Ambulatory Visit: Payer: Medicare Other | Admitting: Physical Therapy

## 2013-01-01 ENCOUNTER — Ambulatory Visit: Payer: Medicare Other | Admitting: Physical Therapy

## 2013-01-03 ENCOUNTER — Ambulatory Visit: Payer: Medicare Other | Admitting: Physical Therapy

## 2013-01-08 ENCOUNTER — Ambulatory Visit: Payer: Medicare Other | Admitting: Physical Therapy

## 2013-01-10 ENCOUNTER — Ambulatory Visit: Payer: Medicare Other | Admitting: Physical Therapy

## 2013-08-06 ENCOUNTER — Ambulatory Visit (INDEPENDENT_AMBULATORY_CARE_PROVIDER_SITE_OTHER): Payer: Medicare Other

## 2013-08-06 VITALS — BP 139/80 | HR 114 | Resp 16 | Ht 70.0 in | Wt 234.0 lb

## 2013-08-06 DIAGNOSIS — B351 Tinea unguium: Secondary | ICD-10-CM

## 2013-08-06 DIAGNOSIS — M79609 Pain in unspecified limb: Secondary | ICD-10-CM

## 2013-08-06 DIAGNOSIS — E1140 Type 2 diabetes mellitus with diabetic neuropathy, unspecified: Secondary | ICD-10-CM

## 2013-08-06 DIAGNOSIS — E1142 Type 2 diabetes mellitus with diabetic polyneuropathy: Secondary | ICD-10-CM

## 2013-08-06 DIAGNOSIS — E1149 Type 2 diabetes mellitus with other diabetic neurological complication: Secondary | ICD-10-CM

## 2013-08-06 NOTE — Progress Notes (Signed)
  Subjective:    Patient ID: Darrell Horne, male    DOB: 10-Feb-1946, 67 y.o.   MRN: [id number] "I guess he's going to trim my toenails." HPI no changes medication her health history since last visit    Review of Systems deferred at this time     Objective:   Physical Exam Neurovascular status as follows pedal pulses DP +2/4 PT pulse one over 4 right foot. Patient is status post BK amputation left leg greater than one year ago. Right foot has decreased epicritic and proprioceptive sensations on Semmes Weinstein testing to forefoot and digits. There is significant for paresthesias burning and stinging to the toes. Temperature is warm to cool turgor normal. No open wounds or ulcerations. Nail somewhat criptotic slightly thickened yellow discolored. No signs of secondary infections capillary refill timed 3-4 seconds all digits.       Assessment & Plan:  Diabetes with history peripheral neuropathy. History of previous dictation BK left secondary diabetes complications. Debridement of nails 1 through 5 right maintain appropriate coming shoes are extra depth shoes. Patient is transported with wheelchair recommend followup in 3 months for continued palliative care in the future as needed  Alvan Dame DP

## 2013-08-06 NOTE — Patient Instructions (Addendum)

## 2013-11-12 ENCOUNTER — Ambulatory Visit (INDEPENDENT_AMBULATORY_CARE_PROVIDER_SITE_OTHER): Payer: Medicare Other

## 2013-11-12 VITALS — BP 61/44 | HR 89 | Resp 18

## 2013-11-12 DIAGNOSIS — B351 Tinea unguium: Secondary | ICD-10-CM

## 2013-11-12 DIAGNOSIS — E1140 Type 2 diabetes mellitus with diabetic neuropathy, unspecified: Secondary | ICD-10-CM

## 2013-11-12 DIAGNOSIS — E1149 Type 2 diabetes mellitus with other diabetic neurological complication: Secondary | ICD-10-CM

## 2013-11-12 DIAGNOSIS — M79609 Pain in unspecified limb: Secondary | ICD-10-CM

## 2013-11-12 DIAGNOSIS — E1142 Type 2 diabetes mellitus with diabetic polyneuropathy: Secondary | ICD-10-CM

## 2013-11-12 NOTE — Progress Notes (Signed)
   Subjective:    Patient ID: Darrell Horne, male    DOB: Oct 07, 1946, 68 y.o.   MRN: [id number]  HPI just need a trim on the right foot Status post BK amputation on the left side.   Review of Systems no new changes or findings noted.     Objective:   Physical Exam Or extremity objective findings as follows DP pulse +2/4 but on the right PT pulse thready one over 4 on right temperature is warm to cool turgor diminished there is some plus one edema patient is some days as well as more than others he has been less active does use a wheelchair for transport. There is decreased epicritic and proprioceptive sensation Semmes Weinstein testing to the toes and forefoot in temperature very cool at today's visit no open wounds no secondary infections no ulcerations noted nails thick criptotic incurvated 1 through 5 bilateral 1 through 5 right foot debridement of thick brittle and yellow discolored criptotic nails x5       Assessment & Plan:  Assessment diabetes with peripheral neuropathy and some angiopathy history of previous BK amputation left this time debridement of mycotic nails 1 through 5 right foot painful tender symptomatic with friability discoloration return for future palliative care is needed suggest 3 month followup  Harriet Masson DPM

## 2013-11-12 NOTE — Patient Instructions (Signed)
Diabetes and Foot Care Diabetes may cause you to have problems because of poor blood supply (circulation) to your feet and legs. This may cause the skin on your feet to become thinner, break easier, and heal more slowly. Your skin may become dry, and the skin may peel and crack. You may also have nerve damage in your legs and feet causing decreased feeling in them. You may not notice minor injuries to your feet that could lead to infections or more serious problems. Taking care of your feet is one of the most important things you can do for yourself.  HOME CARE INSTRUCTIONS  Wear shoes at all times, even in the house. Do not go barefoot. Bare feet are easily injured.  Check your feet daily for blisters, cuts, and redness. If you cannot see the bottom of your feet, use a mirror or ask someone for help.  Wash your feet with warm water (do not use hot water) and mild soap. Then pat your feet and the areas between your toes until they are completely dry. Do not soak your feet as this can dry your skin.  Apply a moisturizing lotion or petroleum jelly (that does not contain alcohol and is unscented) to the skin on your feet and to dry, brittle toenails. Do not apply lotion between your toes.  Trim your toenails straight across. Do not dig under them or around the cuticle. File the edges of your nails with an emery board or nail file.  Do not cut corns or calluses or try to remove them with medicine.  Wear clean socks or stockings every day. Make sure they are not too tight. Do not wear knee-high stockings since they may decrease blood flow to your legs.  Wear shoes that fit properly and have enough cushioning. To break in new shoes, wear them for just a few hours a day. This prevents you from injuring your feet. Always look in your shoes before you put them on to be sure there are no objects inside.  Do not cross your legs. This may decrease the blood flow to your feet.  If you find a minor scrape,  cut, or break in the skin on your feet, keep it and the skin around it clean and dry. These areas may be cleansed with mild soap and water. Do not cleanse the area with peroxide, alcohol, or iodine.  When you remove an adhesive bandage, be sure not to damage the skin around it.  If you have a wound, look at it several times a day to make sure it is healing.  Do not use heating pads or hot water bottles. They may burn your skin. If you have lost feeling in your feet or legs, you may not know it is happening until it is too late.  Make sure your health care provider performs a complete foot exam at least annually or more often if you have foot problems. Report any cuts, sores, or bruises to your health care provider immediately. SEEK MEDICAL CARE IF:   You have an injury that is not healing.  You have cuts or breaks in the skin.  You have an ingrown nail.  You notice redness on your legs or feet.  You feel burning or tingling in your legs or feet.  You have pain or cramps in your legs and feet.  Your legs or feet are numb.  Your feet always feel cold. SEEK IMMEDIATE MEDICAL CARE IF:   There is increasing redness,   swelling, or pain in or around a wound.  There is a red line that goes up your leg.  Pus is coming from a wound.  You develop a fever or as directed by your health care provider.  You notice a bad smell coming from an ulcer or wound. Document Released: 09/23/2000 Document Revised: 05/29/2013 Document Reviewed: 03/05/2013 [employer] Patient Information 2014 [employer].  

## 2014-02-11 ENCOUNTER — Ambulatory Visit (INDEPENDENT_AMBULATORY_CARE_PROVIDER_SITE_OTHER): Payer: Medicare Other

## 2014-02-11 VITALS — BP 122/74 | HR 80 | Resp 16

## 2014-02-11 DIAGNOSIS — E1140 Type 2 diabetes mellitus with diabetic neuropathy, unspecified: Secondary | ICD-10-CM

## 2014-02-11 DIAGNOSIS — E1149 Type 2 diabetes mellitus with other diabetic neurological complication: Secondary | ICD-10-CM

## 2014-02-11 DIAGNOSIS — E1142 Type 2 diabetes mellitus with diabetic polyneuropathy: Secondary | ICD-10-CM

## 2014-02-11 DIAGNOSIS — M79609 Pain in unspecified limb: Secondary | ICD-10-CM

## 2014-02-11 DIAGNOSIS — B351 Tinea unguium: Secondary | ICD-10-CM

## 2014-02-11 NOTE — Progress Notes (Signed)
   Subjective:    Patient ID: Darrell Horne, male    DOB: [birth year], 68 y.o.   MRN: [id number]  HPI Comments: "Cut the toenails"  Right only - BKA left side     Review of Systems no new systemic changes or findings are noted     Objective:   Physical Exam 69 year old white male process this time for diabetic foot and nail care he is a BK amputation of his left side right foot is intact pedal pulses DP postal for PT one over 4 bilateral capillary refill time 3 seconds all digits neurologically skin color pigment normal hair growth absent nails thick brittle crumbly friable discolored brittle and tender on palpation with enclosed shoe wear. Neurologically epicritic as represented sensations intact although diminished distally patient does have paresthesia burning and abnormal pain sensations and tender his diabetes and neuropathy. No other new changes or findings no secondary infection is noted       Assessment & Plan:  Assessment diabetes with peripheral neuropathy history of previous amputation left side amputation nails thick brittle crumbly friable and incurvated 1 through 5 right foot are debrided at this time return in 3 months for continued palliative care  Harriet Masson DPM

## 2014-02-11 NOTE — Patient Instructions (Signed)
Diabetes and Foot Care Diabetes may cause you to have problems because of poor blood supply (circulation) to your feet and legs. This may cause the skin on your feet to become thinner, break easier, and heal more slowly. Your skin may become dry, and the skin may peel and crack. You may also have nerve damage in your legs and feet causing decreased feeling in them. You may not notice minor injuries to your feet that could lead to infections or more serious problems. Taking care of your feet is one of the most important things you can do for yourself.  HOME CARE INSTRUCTIONS  Wear shoes at all times, even in the house. Do not go barefoot. Bare feet are easily injured.  Check your feet daily for blisters, cuts, and redness. If you cannot see the bottom of your feet, use a mirror or ask someone for help.  Wash your feet with warm water (do not use hot water) and mild soap. Then pat your feet and the areas between your toes until they are completely dry. Do not soak your feet as this can dry your skin.  Apply a moisturizing lotion or petroleum jelly (that does not contain alcohol and is unscented) to the skin on your feet and to dry, brittle toenails. Do not apply lotion between your toes.  Trim your toenails straight across. Do not dig under them or around the cuticle. File the edges of your nails with an emery board or nail file.  Do not cut corns or calluses or try to remove them with medicine.  Wear clean socks or stockings every day. Make sure they are not too tight. Do not wear knee-high stockings since they may decrease blood flow to your legs.  Wear shoes that fit properly and have enough cushioning. To break in new shoes, wear them for just a few hours a day. This prevents you from injuring your feet. Always look in your shoes before you put them on to be sure there are no objects inside.  Do not cross your legs. This may decrease the blood flow to your feet.  If you find a minor scrape,  cut, or break in the skin on your feet, keep it and the skin around it clean and dry. These areas may be cleansed with mild soap and water. Do not cleanse the area with peroxide, alcohol, or iodine.  When you remove an adhesive bandage, be sure not to damage the skin around it.  If you have a wound, look at it several times a day to make sure it is healing.  Do not use heating pads or hot water bottles. They may burn your skin. If you have lost feeling in your feet or legs, you may not know it is happening until it is too late.  Make sure your health care provider performs a complete foot exam at least annually or more often if you have foot problems. Report any cuts, sores, or bruises to your health care provider immediately. SEEK MEDICAL CARE IF:   You have an injury that is not healing.  You have cuts or breaks in the skin.  You have an ingrown nail.  You notice redness on your legs or feet.  You feel burning or tingling in your legs or feet.  You have pain or cramps in your legs and feet.  Your legs or feet are numb.  Your feet always feel cold. SEEK IMMEDIATE MEDICAL CARE IF:   There is increasing redness,   swelling, or pain in or around a wound.  There is a red line that goes up your leg.  Pus is coming from a wound.  You develop a fever or as directed by your health care provider.  You notice a bad smell coming from an ulcer or wound. Document Released: 09/23/2000 Document Revised: 05/29/2013 Document Reviewed: 03/05/2013 [employer] Patient Information 2014 [employer].  

## 2014-04-09 ENCOUNTER — Encounter: Payer: Self-pay | Admitting: Endocrinology

## 2014-04-09 ENCOUNTER — Ambulatory Visit (INDEPENDENT_AMBULATORY_CARE_PROVIDER_SITE_OTHER): Payer: Medicare Other | Admitting: Endocrinology

## 2014-04-09 VITALS — BP 126/72 | HR 64 | Temp 99.1°F | Ht 70.0 in | Wt 245.0 lb

## 2014-04-09 DIAGNOSIS — I10 Essential (primary) hypertension: Secondary | ICD-10-CM | POA: Insufficient documentation

## 2014-04-09 DIAGNOSIS — E1149 Type 2 diabetes mellitus with other diabetic neurological complication: Secondary | ICD-10-CM

## 2014-04-09 DIAGNOSIS — I4891 Unspecified atrial fibrillation: Secondary | ICD-10-CM

## 2014-04-09 DIAGNOSIS — E119 Type 2 diabetes mellitus without complications: Secondary | ICD-10-CM | POA: Insufficient documentation

## 2014-04-09 DIAGNOSIS — E785 Hyperlipidemia, unspecified: Secondary | ICD-10-CM

## 2014-04-09 DIAGNOSIS — I480 Paroxysmal atrial fibrillation: Secondary | ICD-10-CM | POA: Insufficient documentation

## 2014-04-09 DIAGNOSIS — R002 Palpitations: Secondary | ICD-10-CM

## 2014-04-09 LAB — BASIC METABOLIC PANEL
BUN: 6 mg/dL (ref 6–23)
CO2: 27 mEq/L (ref 19–32)
Calcium: 8.7 mg/dL (ref 8.4–10.5)
Chloride: 100 mEq/L (ref 96–112)
Creatinine, Ser: 0.6 mg/dL (ref 0.4–1.5)
GFR: 139.48 mL/min (ref >60.00)
Glucose, Bld: 127 mg/dL — ABNORMAL HIGH (ref 70–99)
Potassium: 3.8 mEq/L (ref 3.5–5.1)
Sodium: 134 mEq/L — ABNORMAL LOW (ref 135–145)

## 2014-04-09 LAB — HEMOGLOBIN A1C: Hgb A1c MFr Bld: 6.1 % (ref 4.6–6.5)

## 2014-04-09 NOTE — Progress Notes (Signed)
Subjective:    Patient ID: Darrell Horne, male    DOB: May 28, 1946, 68 y.o.   MRN: [id number]  HPI pt states DM was dx'ed in 2008; he has severe sensory neuropathy of all 4 extremities; he has associated PAD.  he has never been on insulin.  pt says his diet is poor, and exercise is limited by med probs.  He has never had pancreatitis, severe hypoglycemia or DKA.   He says cbg's are in the low-100's.    Past Medical History  Diagnosis Date  . PAF (paroxysmal atrial fibrillation)   . DM2 (diabetes mellitus, type 2)   . HTN (hypertension)   . HLD (hyperlipidemia)   . Chest pain   . Palpitations   . Diabetes mellitus     Past Surgical History  Procedure Laterality Date  . Total ankle arthroplasty    . Cholecystectomy    . Shoulder surgery    . Hernia repair    . Appendectomy      History   Social History  . Marital Status: Married    Spouse Name: N/A    Number of Children: 4  . Years of Education: N/A   Occupational History  . retired Administrator    Social History Main Topics  . Smoking status: Former Smoker    Quit date: 10/10/1981  . Smokeless tobacco: Not on file  . Alcohol Use: 4.2 oz/week    7 Cans of beer per week     Comment: here and there  . Drug Use: No  . Sexual Activity: Not on file   Other Topics Concern  . Not on file   Social History Narrative  . No narrative on file    Current Outpatient Prescriptions on File Prior to Visit  Medication Sig Dispense Refill  . amitriptyline (ELAVIL) 25 MG tablet Take 50 mg by mouth at bedtime.       Marland Kitchen aspirin 81 MG tablet Take 81 mg by mouth daily.        Marland Kitchen atorvastatin (LIPITOR) 40 MG tablet Take 40 mg by mouth at bedtime.      Marland Kitchen b complex-vitamin c-folic acid (NEPHRO-VITE) 0.8 MG TABS Take 0.8 mg by mouth at bedtime.        . gabapentin (NEURONTIN) 300 MG capsule Take 300 mg by mouth 2 (two) times daily.       . metFORMIN (GLUCOPHAGE-XR) 500 MG 24 hr tablet Take 500 mg by mouth every evening.      .  metoprolol succinate (TOPROL-XL) 25 MG 24 hr tablet Take 25 mg by mouth daily.      . citalopram (CELEXA) 20 MG tablet Take 20 mg by mouth daily.      . dabigatran (PRADAXA) 150 MG CAPS Take 150 mg by mouth every 12 (twelve) hours.      . folic acid (FOLVITE) 160 MCG tablet Take 800 mcg by mouth daily.      . meloxicam (MOBIC) 15 MG tablet Take 15 mg by mouth daily.      . potassium chloride SA (K-DUR,KLOR-CON) 20 MEQ tablet Take 20 mEq by mouth daily.       No current facility-administered medications on file prior to visit.    No Known Allergies  Family History  Problem Relation Age of Onset  . Heart disease Mother   . Stroke Father   . Diabetes Sister     BP 126/72  Pulse 64  Temp(Src) 99.1 F (37.3 C) (Oral)  Ht  5\' 10"  (1.778 m)  Wt 245 lb (111.131 kg)  BMI 35.15 kg/m2  SpO2 95%   Review of Systems He has intermittent nausea.  denies weight loss, blurry vision, headache, chest pain, n/v, urinary frequency, muscle cramps, excessive diaphoresis, memory loss, cold intolerance, rhinorrhea, and easy bruising.  He has chronic pain of all 4 extremities.  He has chronic sob and depression.    Objective:   Physical Exam VS: see vs page GEN: no distress.  In wheelchair HEAD: head: no deformity eyes: no periorbital swelling, no proptosis external nose and ears are normal mouth: no lesion seen NECK: supple, thyroid is not enlarged CHEST WALL: no deformity LUNGS: clear to auscultation BREASTS:  No gynecomastia CV: reg rate and rhythm, no murmur ABD: abdomen is soft, nontender.  no hepatosplenomegaly.  not distended.  no hernia MUSCULOSKELETAL: muscle bulk and strength are grossly normal.  no obvious joint swelling.  gait is normal and steady EXTEMITIES: left BKA; RLE: no otherdeformity.  no ulcer.  normal color and temp.  no edema PULSES: right dorsalis pedis intact.  no carotid bruit NEURO:  cn 2-12 grossly intact.   readily moves all 4's.  sensation is intact to touch on  right foot, but decreased from normal.   SKIN:  Normal texture and temperature.  No rash or suspicious lesion is visible.   NODES:  None palpable at the neck PSYCH: alert, well-oriented.  Does not appear anxious nor depressed.  Lab Results  Component Value Date   HGBA1C 6.1 04/09/2014    i reviewed electrocardiogram from 2013.   i have reviewed the following outside records: 2012 hosp admission    Assessment & Plan:  DM, new to me: well-controlled Obesity: this complicates the rx of DM: This impairs the ability to achieve glycemic control.  I'll work around this as best I can PAF: in this context, we need to prescribe DM meds which do not cause hypoglycemia.    Patient is advised the following: Patient Instructions  please call [phone] [facility] physician referral line), to get an appointment with a new primary doctor.   Let me know if you want to do a special x-ray of the stomach, for your nausea.   check your blood sugar once a day.  vary the time of day when you check, between before the 3 meals, and at bedtime.  also check if you have symptoms of your blood sugar being too high or too low.  please keep a record of the readings and bring it to your next appointment here.  You can write it on any piece of paper.  please call us sooner if your blood sugar goes below 70, or if you have a lot of readings over 200. blood tests are being requested for you today.  We'll contact you with results. Please come back for a follow-up appointment in 4 months.

## 2014-04-09 NOTE — Patient Instructions (Addendum)
please call [phone] ([facility] physician referral line), to get an appointment with a new primary doctor.   Let me know if you want to do a special x-ray of the stomach, for your nausea.   check your blood sugar once a day.  vary the time of day when you check, between before the 3 meals, and at bedtime.  also check if you have symptoms of your blood sugar being too high or too low.  please keep a record of the readings and bring it to your next appointment here.  You can write it on any piece of paper.  please call us sooner if your blood sugar goes below 70, or if you have a lot of readings over 200. blood tests are being requested for you today.  We'll contact you with results. Please come back for a follow-up appointment in 4 months.

## 2014-05-13 ENCOUNTER — Ambulatory Visit (INDEPENDENT_AMBULATORY_CARE_PROVIDER_SITE_OTHER): Payer: Medicare Other

## 2014-05-13 DIAGNOSIS — E1142 Type 2 diabetes mellitus with diabetic polyneuropathy: Secondary | ICD-10-CM

## 2014-05-13 DIAGNOSIS — E1149 Type 2 diabetes mellitus with other diabetic neurological complication: Secondary | ICD-10-CM

## 2014-05-13 DIAGNOSIS — B351 Tinea unguium: Secondary | ICD-10-CM

## 2014-05-13 DIAGNOSIS — IMO0002 Reserved for concepts with insufficient information to code with codable children: Secondary | ICD-10-CM

## 2014-05-13 DIAGNOSIS — E1140 Type 2 diabetes mellitus with diabetic neuropathy, unspecified: Secondary | ICD-10-CM

## 2014-05-13 DIAGNOSIS — M79609 Pain in unspecified limb: Secondary | ICD-10-CM

## 2014-05-13 DIAGNOSIS — M79606 Pain in leg, unspecified: Secondary | ICD-10-CM

## 2014-05-13 NOTE — Patient Instructions (Signed)
Diabetes and Foot Care Diabetes may cause you to have problems because of poor blood supply (circulation) to your feet and legs. This may cause the skin on your feet to become thinner, break easier, and heal more slowly. Your skin may become dry, and the skin may peel and crack. You may also have nerve damage in your legs and feet causing decreased feeling in them. You may not notice minor injuries to your feet that could lead to infections or more serious problems. Taking care of your feet is one of the most important things you can do for yourself.  HOME CARE INSTRUCTIONS  Wear shoes at all times, even in the house. Do not go barefoot. Bare feet are easily injured.  Check your feet daily for blisters, cuts, and redness. If you cannot see the bottom of your feet, use a mirror or ask someone for help.  Wash your feet with warm water (do not use hot water) and mild soap. Then pat your feet and the areas between your toes until they are completely dry. Do not soak your feet as this can dry your skin.  Apply a moisturizing lotion or petroleum jelly (that does not contain alcohol and is unscented) to the skin on your feet and to dry, brittle toenails. Do not apply lotion between your toes.  Trim your toenails straight across. Do not dig under them or around the cuticle. File the edges of your nails with an emery board or nail file.  Do not cut corns or calluses or try to remove them with medicine.  Wear clean socks or stockings every day. Make sure they are not too tight. Do not wear knee-high stockings since they may decrease blood flow to your legs.  Wear shoes that fit properly and have enough cushioning. To break in new shoes, wear them for just a few hours a day. This prevents you from injuring your feet. Always look in your shoes before you put them on to be sure there are no objects inside.  Do not cross your legs. This may decrease the blood flow to your feet.  If you find a minor scrape,  cut, or break in the skin on your feet, keep it and the skin around it clean and dry. These areas may be cleansed with mild soap and water. Do not cleanse the area with peroxide, alcohol, or iodine.  When you remove an adhesive bandage, be sure not to damage the skin around it.  If you have a wound, look at it several times a day to make sure it is healing.  Do not use heating pads or hot water bottles. They may burn your skin. If you have lost feeling in your feet or legs, you may not know it is happening until it is too late.  Make sure your health care provider performs a complete foot exam at least annually or more often if you have foot problems. Report any cuts, sores, or bruises to your health care provider immediately. SEEK MEDICAL CARE IF:   You have an injury that is not healing.  You have cuts or breaks in the skin.  You have an ingrown nail.  You notice redness on your legs or feet.  You feel burning or tingling in your legs or feet.  You have pain or cramps in your legs and feet.  Your legs or feet are numb.  Your feet always feel cold. SEEK IMMEDIATE MEDICAL CARE IF:   There is increasing redness,   swelling, or pain in or around a wound.  There is a red line that goes up your leg.  Pus is coming from a wound.  You develop a fever or as directed by your health care provider.  You notice a bad smell coming from an ulcer or wound. Document Released: 09/23/2000 Document Revised: 05/29/2013 Document Reviewed: 03/05/2013 Exit[employer] Patient Information 2015 [employer]. This information is not intended to replace advice given to you by your health care provider. Make sure you discuss any questions you have with your health care provider.  

## 2014-05-13 NOTE — Progress Notes (Signed)
   Subjective:    Patient ID: Darrell Horne, male    DOB: 04/27/1946, 68 y.o.   MRN: [id number]  HPI  Pt presents for nail debridement, small lesion on 2nd met left foot, happened 1 week ago  Review of Systems no new findings or systemic changes noted     Objective:   Physical Exam Lower extremity objective findings unchanged patient is 2 years status post BK dictation on the left side right foot has mild edema plus one +2 pitting around the ankle pedal pulses are palpable DP +2/4 PT one over 4 on the right capillary refill timed 3-4 seconds all digits epicritic and proprioceptive sensations have been diminished patient does have paresthesia with shooting pain and diabetic neuropathy issues. No open wounds or ulcerations however there appears to be new abrasion over the dorsum of the second digit right foot over the distal IP joint. Patient is Darrell Horne hitting it or may have bumped against his wheelchair. Has been applying Silvadene and a Band-Aid to this area there is no sign of infection no purulence no discharge or drainage no cellulitis noted. Remainder of exam reveals nails thick brittle criptotic incurvated 1 through 5 right open wounds or ulcerations there is a slight macular rash plantar right arch for which I suggested topical Lamisil or Lotrimin which can be obtained OTC.      Assessment & Plan:  Assessment this time diabetes with history peripheral neuropathy history of amputations and complications. This time debridement of nails 1 through 5 right and the presence of diabetes as well as onychomycosis return for future palliative care Josseline Reddin months  Harriet Masson DPM

## 2014-07-07 ENCOUNTER — Emergency Department (HOSPITAL_COMMUNITY)
Admission: EM | Admit: 2014-07-07 | Discharge: 2014-07-08 | Disposition: A | Discharge status: Home / Self Care | Payer: Medicare Other | Attending: Emergency Medicine | Admitting: Emergency Medicine

## 2014-07-07 ENCOUNTER — Encounter (HOSPITAL_COMMUNITY): Payer: Self-pay | Admitting: Emergency Medicine

## 2014-07-07 ENCOUNTER — Emergency Department (HOSPITAL_COMMUNITY): Payer: Medicare Other

## 2014-07-07 DIAGNOSIS — IMO0002 Reserved for concepts with insufficient information to code with codable children: Secondary | ICD-10-CM | POA: Insufficient documentation

## 2014-07-07 DIAGNOSIS — I4891 Unspecified atrial fibrillation: Secondary | ICD-10-CM | POA: Diagnosis not present

## 2014-07-07 DIAGNOSIS — E119 Type 2 diabetes mellitus without complications: Secondary | ICD-10-CM | POA: Diagnosis not present

## 2014-07-07 DIAGNOSIS — K7460 Unspecified cirrhosis of liver: Secondary | ICD-10-CM | POA: Insufficient documentation

## 2014-07-07 DIAGNOSIS — Z87891 Personal history of nicotine dependence: Secondary | ICD-10-CM | POA: Diagnosis not present

## 2014-07-07 DIAGNOSIS — E785 Hyperlipidemia, unspecified: Secondary | ICD-10-CM | POA: Insufficient documentation

## 2014-07-07 DIAGNOSIS — Z7982 Long term (current) use of aspirin: Secondary | ICD-10-CM | POA: Insufficient documentation

## 2014-07-07 DIAGNOSIS — R109 Unspecified abdominal pain: Secondary | ICD-10-CM | POA: Insufficient documentation

## 2014-07-07 DIAGNOSIS — Z7902 Long term (current) use of antithrombotics/antiplatelets: Secondary | ICD-10-CM | POA: Insufficient documentation

## 2014-07-07 DIAGNOSIS — R0602 Shortness of breath: Secondary | ICD-10-CM | POA: Diagnosis not present

## 2014-07-07 DIAGNOSIS — Z79899 Other long term (current) drug therapy: Secondary | ICD-10-CM | POA: Insufficient documentation

## 2014-07-07 DIAGNOSIS — R1011 Right upper quadrant pain: Secondary | ICD-10-CM

## 2014-07-07 DIAGNOSIS — I10 Essential (primary) hypertension: Secondary | ICD-10-CM | POA: Insufficient documentation

## 2014-07-07 DIAGNOSIS — K7469 Other cirrhosis of liver: Secondary | ICD-10-CM

## 2014-07-07 LAB — LIPASE, BLOOD: Lipase: 17 U/L (ref 11–59)

## 2014-07-07 LAB — COMPREHENSIVE METABOLIC PANEL
ALT: 18 U/L (ref 0–53)
AST: 26 U/L (ref 0–37)
Albumin: 2.9 g/dL — ABNORMAL LOW (ref 3.5–5.2)
Alkaline Phosphatase: 139 U/L — ABNORMAL HIGH (ref 39–117)
Anion gap: 11 (ref 5–15)
BUN: 5 mg/dL — ABNORMAL LOW (ref 6–23)
CO2: 24 mEq/L (ref 19–32)
Calcium: 8.6 mg/dL (ref 8.4–10.5)
Chloride: 98 mEq/L (ref 96–112)
Creatinine, Ser: 0.6 mg/dL (ref 0.50–1.35)
GFR calc Af Amer: >90 mL/min (ref >90)
GFR calc non Af Amer: >90 mL/min (ref >90)
Glucose, Bld: 118 mg/dL — ABNORMAL HIGH (ref 70–99)
Potassium: 4.1 mEq/L (ref 3.7–5.3)
Sodium: 133 mEq/L — ABNORMAL LOW (ref 137–147)
Total Bilirubin: 0.9 mg/dL (ref 0.3–1.2)
Total Protein: 7.5 g/dL (ref 6.0–8.3)

## 2014-07-07 LAB — CBC
HCT: 40.8 % (ref 39.0–52.0)
Hemoglobin: 13.9 g/dL (ref 13.0–17.0)
MCH: 33.3 pg (ref 26.0–34.0)
MCHC: 34.1 g/dL (ref 30.0–36.0)
MCV: 97.6 fL (ref 78.0–100.0)
Platelets: 146 10*3/uL — ABNORMAL LOW (ref 150–400)
RBC: 4.18 MIL/uL — ABNORMAL LOW (ref 4.22–5.81)
RDW: 13.7 % (ref 11.5–15.5)
WBC: 8.4 10*3/uL (ref 4.0–10.5)

## 2014-07-07 LAB — CBG MONITORING, ED: Glucose-Capillary: 127 mg/dL — ABNORMAL HIGH (ref 70–99)

## 2014-07-07 LAB — I-STAT TROPONIN, ED: Troponin i, poc: 0 ng/mL (ref 0.00–0.08)

## 2014-07-07 MED ORDER — MORPHINE SULFATE 4 MG/ML IJ SOLN
6.0000 mg | Freq: Once | INTRAMUSCULAR | Status: AC
  Administered 2014-07-08: 6 mg via INTRAVENOUS
  Filled 2014-07-07: qty 2

## 2014-07-07 NOTE — ED Notes (Signed)
Pt presents with RUQ pain onset around noon today- pt admits to shortness of breath.  Pt reports pain is worse when he takes a deep breath.  Denies N/V.  Respirations e/u, lung sounds clear throughout.

## 2014-07-08 ENCOUNTER — Emergency Department (HOSPITAL_COMMUNITY): Payer: Medicare Other

## 2014-07-08 ENCOUNTER — Encounter (HOSPITAL_COMMUNITY): Payer: Self-pay

## 2014-07-08 LAB — URINALYSIS, ROUTINE W REFLEX MICROSCOPIC
Bilirubin Urine: NEGATIVE
Glucose, UA: NEGATIVE mg/dL
Hgb urine dipstick: NEGATIVE
Ketones, ur: NEGATIVE mg/dL
Leukocytes, UA: NEGATIVE
Nitrite: NEGATIVE
Protein, ur: NEGATIVE mg/dL
Specific Gravity, Urine: <1.005 — ABNORMAL LOW (ref 1.005–1.030)
Urobilinogen, UA: 0.2 mg/dL (ref 0.0–1.0)
pH: 7.5 (ref 5.0–8.0)

## 2014-07-08 MED ORDER — IOHEXOL 300 MG/ML  SOLN
100.0000 mL | Freq: Once | INTRAMUSCULAR | Status: AC | PRN
  Administered 2014-07-08: 100 mL via INTRAVENOUS

## 2014-07-08 MED ORDER — IOHEXOL 300 MG/ML  SOLN
25.0000 mL | Freq: Once | INTRAMUSCULAR | Status: AC | PRN
  Administered 2014-07-08: 25 mL via ORAL

## 2014-07-08 NOTE — ED Provider Notes (Signed)
CSN: [id number]     Arrival date & time 07/07/14  1955 History   First MD Initiated Contact with Patient 07/07/14 2338     Chief Complaint  Patient presents with  . Abdominal Pain  . Shortness of Breath     (Consider location/radiation/quality/duration/timing/severity/associated sxs/prior Treatment) HPI Comments: 68 year old male with history of atrial fibrillation on Eliquis, diabetes, high blood pressure, lipids presents with right flank and upper abdominal pain mild radiation to the central the abdomen.  No history of similar. Patient had gallbladder and appendix removed years ago. Patient denies blood clot history, recent surgeries, active cancer, hemoptysis, unilateral leg swelling. Patient has mild shortness of breath due to the pain. No hepatitis or liver disease. No cough or congestion. Pain fairly constant worse with the patient and movement, no injuries.. No kidney stone history known.  Patient is a 68 y.o. male presenting with abdominal pain and shortness of breath. The history is provided by the patient.  Abdominal Pain Associated symptoms: shortness of breath   Associated symptoms: no chest pain, no chills, no dysuria, no fever, no nausea and no vomiting   Shortness of Breath Associated symptoms: abdominal pain   Associated symptoms: no chest pain, no fever, no headaches, no neck pain, no rash and no vomiting     Past Medical History  Diagnosis Date  . PAF (paroxysmal atrial fibrillation)   . DM2 (diabetes mellitus, type 2)   . HTN (hypertension)   . HLD (hyperlipidemia)   . Chest pain   . Palpitations   . Diabetes mellitus    Past Surgical History  Procedure Laterality Date  . Total ankle arthroplasty    . Cholecystectomy    . Shoulder surgery    . Hernia repair    . Appendectomy     Family History  Problem Relation Age of Onset  . Heart disease Mother   . Stroke Father   . Diabetes Sister    History  Substance Use Topics  . Smoking status: Former Smoker     Quit date: 10/10/1981  . Smokeless tobacco: Not on file  . Alcohol Use: 4.2 oz/week    7 Cans of beer per week     Comment: here and there    Review of Systems  Constitutional: Negative for fever and chills.  HENT: Negative for congestion.   Eyes: Negative for visual disturbance.  Respiratory: Positive for shortness of breath.   Cardiovascular: Negative for chest pain and leg swelling.  Gastrointestinal: Positive for abdominal pain. Negative for nausea and vomiting.  Genitourinary: Negative for dysuria and flank pain.  Musculoskeletal: Negative for back pain, neck pain and neck stiffness.  Skin: Negative for rash.  Neurological: Negative for light-headedness and headaches.      Allergies  Review of patient's allergies indicates no known allergies.  Home Medications   Prior to Admission medications   Medication Sig Start Date End Date Taking? Authorizing Provider  amitriptyline (ELAVIL) 25 MG tablet Take 50 mg by mouth at bedtime.    Yes Historical Provider, MD  apixaban (ELIQUIS) 5 MG TABS tablet Take 5 mg by mouth 2 (two) times daily.   Yes Historical Provider, MD  aspirin EC 81 MG tablet Take 81 mg by mouth daily.   Yes Historical Provider, MD  atorvastatin (LIPITOR) 40 MG tablet Take 40 mg by mouth daily.    Yes Historical Provider, MD  b complex vitamins tablet Take 1 tablet by mouth daily.   Yes Historical Provider, MD  Cholecalciferol (VITAMIN  D-3) 5000 UNITS TABS Take 5,000 Units by mouth daily.   Yes Historical Provider, MD  gabapentin (NEURONTIN) 300 MG capsule Take 600 mg by mouth 3 (three) times daily.    Yes Historical Provider, MD  metFORMIN (GLUCOPHAGE-XR) 500 MG 24 hr tablet Take 500 mg by mouth daily with supper.    Yes Historical Provider, MD  metoprolol succinate (TOPROL-XL) 50 MG 24 hr tablet Take 50 mg by mouth daily. Take with or immediately following a meal.   Yes Historical Provider, MD  Polyethyl Glycol-Propyl Glycol (SYSTANE OP) Place 1 drop into  both eyes 2 (two) times daily as needed (dry eyes).   Yes Historical Provider, MD   BP 119/63  Pulse 79  Temp(Src) 98.9 F (37.2 C) (Oral)  Resp 20  Ht 5\' 10"  (1.778 m)  Wt 245 lb (111.131 kg)  BMI 35.15 kg/m2  SpO2 99% Physical Exam  Nursing note and vitals reviewed. Constitutional: He is oriented to person, place, and time. He appears well-developed and well-nourished.  HENT:  Head: Normocephalic and atraumatic.  Eyes: Conjunctivae are normal. Right eye exhibits no discharge. Left eye exhibits no discharge.  Neck: Normal range of motion. Neck supple. No tracheal deviation present.  Cardiovascular: Normal rate, regular rhythm and intact distal pulses.   Pulmonary/Chest: Effort normal and breath sounds normal.  Abdominal: Soft. He exhibits no distension. There is tenderness (right mid flank and right upper quadrant mild). There is no guarding.  Musculoskeletal: He exhibits no edema.  Neurological: He is alert and oriented to person, place, and time.  Skin: Skin is warm. No rash noted.  Psychiatric: He has a normal mood and affect.    ED Course  Procedures (including critical care time) Labs Review Labs Reviewed  CBC - Abnormal; Notable for the following:    RBC 4.18 (*)    Platelets 146 (*)    All other components within normal limits  COMPREHENSIVE METABOLIC PANEL - Abnormal; Notable for the following:    Sodium 133 (*)    Glucose, Bld 118 (*)    BUN 5 (*)    Albumin 2.9 (*)    Alkaline Phosphatase 139 (*)    All other components within normal limits  CBG MONITORING, ED - Abnormal; Notable for the following:    Glucose-Capillary 127 (*)    All other components within normal limits  LIPASE, BLOOD  URINALYSIS, ROUTINE W REFLEX MICROSCOPIC  I-STAT TROPOININ, ED    Imaging Review Dg Chest 2 View  07/07/2014   CLINICAL DATA:  Shortness of breath  EXAM: CHEST  2 VIEW  COMPARISON:  Prior radiograph from 01/31/2012  FINDINGS: The cardiac and mediastinal silhouettes are  stable in size and contour, and remain within normal limits.  The lungs are hypoinflated with patchy bibasilar atelectasis. No airspace consolidation, pleural effusion, or pulmonary edema is identified. There is no pneumothorax.  No acute osseous abnormality identified.  IMPRESSION: Shallow lung inflation with patchy bibasilar atelectasis. No other acute cardiopulmonary abnormality.   Electronically Signed   By: Jeannine Boga M.D.   On: 07/07/2014 21:54   Ct Abdomen Pelvis W Contrast  07/08/2014   CLINICAL DATA:  Severe right upper quadrant pain  EXAM: CT ABDOMEN AND PELVIS WITH CONTRAST  TECHNIQUE: Multidetector CT imaging of the abdomen and pelvis was performed using the standard protocol following bolus administration of intravenous contrast.  CONTRAST:  182mL OMNIPAQUE IOHEXOL 300 MG/ML  SOLN  COMPARISON:  CT abdomen pelvis dated 01/16/2012  FINDINGS: Lower chest:  Eventration  of right hemidiaphragm.  Scarring/ atelectasis at the bilateral lung bases.  Coronary atherosclerosis.  10 mm short axis paraesophageal node (series 2/image 8).  Hepatobiliary: Cirrhotic configuration of the liver. No suspicious/enhancing hepatic lesions.  Status post cholecystectomy. No intrahepatic or extrahepatic ductal dilatation.  Spleen: Within normal limits.  Pancreas: Within normal limits.  Stomach/Bowel: Tiny hiatal hernia. Stomach is otherwise within normal limits.  No evidence of bowel obstruction.  Prior appendectomy.  Adrenals/urinary tract: Adrenal glands are unremarkable.  Kidneys are within normal limits.  Bladder is unremarkable.  Vascular/Lymphatic: Atherosclerotic calcifications of the abdominal aorta and branch vessels.  Small perigastric/gastroesophageal varices.  Portal vein is patent.  Small upper abdominal lymph nodes including a 12 mm axis peripancreatic node (series 2/ image 31), likely reactive. No suspicious abdominopelvic lymphadenopathy.  Reproductive: Prostate is unremarkable.  Musculoskeletal:  Degenerative changes the visualized thoracolumbar spine.  Old healed right posterolateral 8th rib fracture deformity (series 2/image 17).  Other: No abdominopelvic ascites.  IMPRESSION: No evidence of bowel obstruction.  Prior appendectomy.  Cirrhosis. Small perigastric/gastroesophageal varices. Portal vein is patent.  Status post cholecystectomy.   Electronically Signed   By: Julian Hy M.D.   On: 07/08/2014 01:38     EKG Interpretation   Date/Time:  Monday July 07 2014 20:07:58 EDT Ventricular Rate:  81 PR Interval:  184 QRS Duration: 78 QT Interval:  384 QTC Calculation: 446 R Axis:   -33 Text Interpretation:  Normal sinus rhythm Left axis deviation poor  baseline V2 AND V3, overall similar to previous Confirmed by Brunilda Eble  MD,  Maceo Hernan ([id number]) on 07/07/2014 11:40:08 PM      MDM   Final diagnoses:  Right upper quadrant abdominal pain  Other cirrhosis of liver   Patient presents with right upper quadrant and right flank pain, history of gallbladder removal and discussed differential including lower lobe pneumonia, liver, kidney stone. Plan for pain meds and CT scan for further details. Patient doesn't have a blood clot risks and has focal tenderness to pretest probably very low for PE. Vitals unremarkable.   Pt does drink alcohol regularly, no hx of or risks of hepatitis, lft normal, pt pain resolved on recheck. Stressed close fup outpt with pcp and, varices precautions and reasons to return given.  Discussed avoiding tylenol and alcohol.  Results and differential diagnosis were discussed with the patient/parent/guardian. Close follow up outpatient was discussed, comfortable with the plan.   Medications  morphine 4 MG/ML injection 6 mg (6 mg Intravenous Given 07/08/14 0008)  iohexol (OMNIPAQUE) 300 MG/ML solution 25 mL (25 mLs Oral Contrast Given 07/08/14 0042)  iohexol (OMNIPAQUE) 300 MG/ML solution 100 mL (100 mLs Intravenous Contrast Given 07/08/14 0109)    Filed  Vitals:   07/08/14 0030 07/08/14 0100 07/08/14 0141 07/08/14 0215  BP: 118/65 111/88 122/60 107/60  Pulse: 81 91 80 79  Temp:   98 F (36.7 C)   TempSrc:   Oral   Resp: 12 20 16 13   Height:      Weight:      SpO2: 96% 99% 100% 93%    Final diagnoses:  Right upper quadrant abdominal pain  Other cirrhosis of liver         Mariea Clonts, MD 07/09/14 1538

## 2014-07-08 NOTE — ED Notes (Signed)
Pt reminded again for urine specimen.

## 2014-07-08 NOTE — Discharge Instructions (Signed)
If you were given medicines take as directed.  If you are on coumadin or contraceptives realize their levels and effectiveness is altered by many different medicines.  If you have any reaction (rash, tongues swelling, other) to the medicines stop taking and see a physician.   Avoid tylenol until told otherwise, avoid alcohol.   Cirrhosis Cirrhosis is a condition of scarring of the liver which is caused when the liver has tried repairing itself following damage. This damage may come from a previous infection such as one of the forms of hepatitis (usually hepatitis C), or the damage may come from being injured by toxins. The main toxin that causes this damage is alcohol. The scarring of the liver from use of alcohol is irreversible. That means the liver cannot return to normal even though alcohol is not used any more. The main danger of hepatitis C infection is that it may cause long-lasting (chronic) liver disease, and this also may lead to cirrhosis. This complication is progressive and irreversible. CAUSES  Prior to available blood tests, hepatitis C could be contracted by blood transfusions. Since testing of blood has improved, this is now unlikely. This infection can also be contracted through intravenous drug use and the sharing of needles. It can also be contracted through sexual relationships. The injury caused by alcohol comes from too much use. It is not a few drinks that poison the liver, but years of misuse. Usually there will be some signs and symptoms early with scarring of the liver that suggest the development of better habits. Alcohol should never be used while using acetaminophen. A small dose of both taken together may cause irreversible damage to the liver. HOME CARE INSTRUCTIONS  There is no specific treatment for cirrhosis. However, there are things you can do to avoid making the condition worse.  Rest as needed.  Eat a well-balanced diet. Your caregiver can help you with  suggestions.  Vitamin supplements including vitamins A, K, D, and thiamine can help.  A low-salt diet, water restriction, or diuretic medicine may be needed to reduce fluid retention.  Avoid alcohol. This can be extremely toxic if combined with acetaminophen.  Avoid drugs which are toxic to the liver. Some of these include isoniazid, methyldopa, acetaminophen, anabolic steroids (muscle-building drugs), erythromycin, and oral contraceptives (birth control pills). Check with your caregiver to make sure medicines you are presently taking will not be harmful.  Periodic blood tests may be required. Follow your caregiver's advice regarding the timing of these.  Milk thistle is an herbal remedy which does protect the liver against toxins. However, it will not help once the liver has been scarred. SEEK MEDICAL CARE IF:  You have increasing fatigue or weakness.  You develop swelling of the hands, feet, legs, or face.  You vomit bright red blood, or a coffee ground appearing material.  You have blood in your stools, or the stools turn black and tarry.  You have a fever.  You develop loss of appetite, or have nausea and vomiting.  You develop jaundice.  You develop easy bruising or bleeding.  You have worsening of any of the problems you are concerned about. Document Released: 09/26/2005 Document Revised: 12/19/2011 Document Reviewed: 05/14/2008 [employer] Patient Information 2015 [place], [place]. This information is not intended to replace advice given to you by your health care provider. Make sure you discuss any questions you have with your health care provider.  Please follow up as directed and return to the ER or see a physician  for new or worsening symptoms.  Thank you. Filed Vitals:   07/07/14 2010 07/08/14 0030 07/08/14 0100 07/08/14 0141  BP: 119/63 118/65 111/88 122/60  Pulse: 79 81 91 80  Temp: 98.9 F (37.2 C)   98 F (36.7 C)  TempSrc: Oral   Oral  Resp: 20 12 20 16    Height: 5\' 10"  (1.778 m)     Weight: 245 lb (111.131 kg)     SpO2: 99% 96% 99% 100%

## 2014-08-11 ENCOUNTER — Ambulatory Visit (INDEPENDENT_AMBULATORY_CARE_PROVIDER_SITE_OTHER): Payer: Medicare Other | Admitting: Endocrinology

## 2014-08-11 ENCOUNTER — Encounter: Payer: Self-pay | Admitting: Endocrinology

## 2014-08-11 VITALS — BP 134/76 | HR 88 | Temp 98.9°F | Ht 70.0 in | Wt 262.0 lb

## 2014-08-11 DIAGNOSIS — E119 Type 2 diabetes mellitus without complications: Secondary | ICD-10-CM

## 2014-08-11 LAB — LIPID PANEL
Cholesterol: 137 mg/dL (ref 0–200)
HDL: 55.4 mg/dL (ref >39.00)
LDL Cholesterol: 68 mg/dL (ref 0–99)
NonHDL: 81.6
Total CHOL/HDL Ratio: 2
Triglycerides: 70 mg/dL (ref 0.0–149.0)
VLDL: 14 mg/dL (ref 0.0–40.0)

## 2014-08-11 LAB — HEMOGLOBIN A1C: Hgb A1c MFr Bld: 6.4 % (ref 4.6–6.5)

## 2014-08-11 LAB — MICROALBUMIN / CREATININE URINE RATIO
Creatinine,U: 40.7 mg/dL
Microalb Creat Ratio: 0.5 mg/g (ref 0.0–30.0)
Microalb, Ur: 0.2 mg/dL (ref 0.0–1.9)

## 2014-08-11 NOTE — Progress Notes (Signed)
Subjective:    Patient ID: Darrell Horne, male    DOB: 1946/08/29, 68 y.o.   MRN: [id number]  HPI  Pt returns for f/u of diabetes mellitus: DM type: 2 Dx'ed: 7001 Complications: severe painful neuropathy of all 4 extremities, and left BKA.   Therapy: metformin DKA: never Severe hypoglycemia: never Pancreatitis: never Other: he has never been on insulin Interval history: no cbg record, but states cbg's are well-controlled.  No change in chronic pain.  Past Medical History  Diagnosis Date  . PAF (paroxysmal atrial fibrillation)   . DM2 (diabetes mellitus, type 2)   . HTN (hypertension)   . HLD (hyperlipidemia)   . Chest pain   . Palpitations   . Diabetes mellitus     Past Surgical History  Procedure Laterality Date  . Total ankle arthroplasty    . Cholecystectomy    . Shoulder surgery    . Hernia repair    . Appendectomy      History   Social History  . Marital Status: Married    Spouse Name: N/A    Number of Children: 4  . Years of Education: N/A   Occupational History  . retired Administrator    Social History Main Topics  . Smoking status: Former Smoker    Quit date: 10/10/1981  . Smokeless tobacco: Not on file  . Alcohol Use: 4.2 oz/week    7 Cans of beer per week     Comment: here and there  . Drug Use: No  . Sexual Activity: Not on file   Other Topics Concern  . Not on file   Social History Narrative    Current Outpatient Prescriptions on File Prior to Visit  Medication Sig Dispense Refill  . amitriptyline (ELAVIL) 25 MG tablet Take 50 mg by mouth at bedtime.     Marland Kitchen apixaban (ELIQUIS) 5 MG TABS tablet Take 5 mg by mouth 2 (two) times daily.    Marland Kitchen aspirin EC 81 MG tablet Take 81 mg by mouth daily.    Marland Kitchen atorvastatin (LIPITOR) 40 MG tablet Take 40 mg by mouth daily.     Marland Kitchen b complex vitamins tablet Take 1 tablet by mouth daily.    . Cholecalciferol (VITAMIN D-3) 5000 UNITS TABS Take 5,000 Units by mouth daily.    Marland Kitchen gabapentin (NEURONTIN) 300 MG  capsule Take 600 mg by mouth 3 (three) times daily.     . metFORMIN (GLUCOPHAGE-XR) 500 MG 24 hr tablet Take 500 mg by mouth daily with supper.     . metoprolol succinate (TOPROL-XL) 50 MG 24 hr tablet Take 50 mg by mouth daily. Take with or immediately following a meal.    . Polyethyl Glycol-Propyl Glycol (SYSTANE OP) Place 1 drop into both eyes 2 (two) times daily as needed (dry eyes).     No current facility-administered medications on file prior to visit.    No Known Allergies  Family History  Problem Relation Age of Onset  . Heart disease Mother   . Stroke Father   . Diabetes Sister     BP 134/76 mmHg  Pulse 88  Temp(Src) 98.9 F (37.2 C) (Oral)  Ht 5\' 10"  (1.778 m)  Wt 262 lb (118.842 kg)  BMI 37.59 kg/m2  SpO2 95%    Review of Systems Nausea is resolved.  Denies weight change    Objective:   Physical Exam VITAL SIGNS:  See vs page GENERAL: no distress Ext: left BKA Pulses: right dorsalis pedis intact.  Right foot: no deformity.  no edema Skin:no ulcer on the right foot: normal color and temp: healed surgical scar over the right medial malleolus. Neuro: sensation is intact to touch on the right foot, but decreased from normal.        Assessment & Plan:  DM: apparently well-controlled  Patient is advised the following: Patient Instructions  check your blood sugar once a day.  vary the time of day when you check, between before the 3 meals, and at bedtime.  also check if you have symptoms of your blood sugar being too high or too low.  please keep a record of the readings and bring it to your next appointment here.  You can write it on any piece of paper.  please call us sooner if your blood sugar goes below 70, or if you have a lot of readings over 200. blood tests are being requested for you today.  We'll contact you with results. Please come back for a follow-up appointment in 6 months.

## 2014-08-11 NOTE — Patient Instructions (Addendum)
check your blood sugar once a day.  vary the time of day when you check, between before the 3 meals, and at bedtime.  also check if you have symptoms of your blood sugar being too high or too low.  please keep a record of the readings and bring it to your next appointment here.  You can write it on any piece of paper.  please call us sooner if your blood sugar goes below 70, or if you have a lot of readings over 200. blood tests are being requested for you today.  We'll contact you with results.   Please come back for a follow-up appointment in 6 months.    

## 2014-08-19 ENCOUNTER — Ambulatory Visit (INDEPENDENT_AMBULATORY_CARE_PROVIDER_SITE_OTHER): Payer: Medicare Other

## 2014-08-19 ENCOUNTER — Ambulatory Visit: Payer: Medicare Other

## 2014-08-19 DIAGNOSIS — M79673 Pain in unspecified foot: Secondary | ICD-10-CM

## 2014-08-19 DIAGNOSIS — B351 Tinea unguium: Secondary | ICD-10-CM

## 2014-08-19 DIAGNOSIS — E1140 Type 2 diabetes mellitus with diabetic neuropathy, unspecified: Secondary | ICD-10-CM

## 2014-08-19 NOTE — Progress Notes (Signed)
   Subjective:    Patient ID: Darrell Horne, male    DOB: Jan 25, 1946, 68 y.o.   MRN: [id number]  HPI  Pt presents for nail debridement  Review of Systemsno new findings or systemic changes noted     Objective:   Physical Exam Neurovascular status unchanged pedal pulses DP plus one PT plus one over 4 on the right is status post amputation left. No new findings or systemic changes noted nails thick brittle chromic frontal dystrophic with discoloration proptosis mild digital contractures noted no open wounds no ulcers are noted       Assessment & Plan:  Assessment diabetes with history of neuropathy and angiopathy history of amputation and complications this time painful dystrophic friable mycotic nails 1 through 5 on the right are debrided return for future palliative care is needed contact us feet changes or exacerbations or new problems noted in the interim. Next  Harriet Masson DPM

## 2015-02-09 ENCOUNTER — Ambulatory Visit: Payer: Medicare Other | Admitting: Endocrinology

## 2015-02-10 ENCOUNTER — Ambulatory Visit (INDEPENDENT_AMBULATORY_CARE_PROVIDER_SITE_OTHER): Payer: Commercial Managed Care - HMO | Admitting: Endocrinology

## 2015-02-10 ENCOUNTER — Encounter: Payer: Self-pay | Admitting: Endocrinology

## 2015-02-10 VITALS — BP 142/88 | HR 92 | Temp 98.1°F | Wt 269.0 lb

## 2015-02-10 DIAGNOSIS — E119 Type 2 diabetes mellitus without complications: Secondary | ICD-10-CM | POA: Diagnosis not present

## 2015-02-10 NOTE — Patient Instructions (Addendum)
check your blood sugar once a day.  vary the time of day when you check, between before the 3 meals, and at bedtime.  also check if you have symptoms of your blood sugar being too high or too low.  please keep a record of the readings and bring it to your next appointment here.  You can write it on any piece of paper.  please call us sooner if your blood sugar goes below 70, or if you have a lot of readings over 200. blood tests are being requested for you today.  We'll contact you with results. If it is high, we'll add "[place]." Please come back for a follow-up appointment in 6 months.

## 2015-02-10 NOTE — Progress Notes (Signed)
Subjective:    Patient ID: Darrell Horne, male    DOB: Nov 14, 1945, 69 y.o.   MRN: [id number]  HPI Pt returns for f/u of diabetes mellitus: DM type: 2 Dx'ed: 7622 Complications: severe painful neuropathy of all 4 extremities, and left BKA.   Therapy: metformin DKA: never Severe hypoglycemia: never Pancreatitis: never Other: he has never been on insulin Interval history: no cbg record, but states cbg's are well-controlled.  No change in chronic pain. Past Medical History  Diagnosis Date  . PAF (paroxysmal atrial fibrillation)   . DM2 (diabetes mellitus, type 2)   . HTN (hypertension)   . HLD (hyperlipidemia)   . Chest pain   . Palpitations   . Diabetes mellitus     Past Surgical History  Procedure Laterality Date  . Total ankle arthroplasty    . Cholecystectomy    . Shoulder surgery    . Hernia repair    . Appendectomy      History   Social History  . Marital Status: Married    Spouse Name: N/A  . Number of Children: 4  . Years of Education: N/A   Occupational History  . retired Administrator    Social History Main Topics  . Smoking status: Former Smoker    Quit date: 10/10/1981  . Smokeless tobacco: Not on file  . Alcohol Use: 4.2 oz/week    7 Cans of beer per week     Comment: here and there  . Drug Use: No  . Sexual Activity: Not on file   Other Topics Concern  . Not on file   Social History Narrative    Current Outpatient Prescriptions on File Prior to Visit  Medication Sig Dispense Refill  . amitriptyline (ELAVIL) 25 MG tablet Take 25 mg by mouth at bedtime.     Marland Kitchen apixaban (ELIQUIS) 5 MG TABS tablet Take 5 mg by mouth 2 (two) times daily.    Marland Kitchen aspirin EC 81 MG tablet Take 81 mg by mouth daily.    Marland Kitchen atorvastatin (LIPITOR) 40 MG tablet Take 40 mg by mouth daily.     Marland Kitchen b complex vitamins tablet Take 1 tablet by mouth daily.    . Cholecalciferol (VITAMIN D-3) 5000 UNITS TABS Take 5,000 Units by mouth daily.    Marland Kitchen gabapentin (NEURONTIN) 300 MG  capsule Take 600 mg by mouth 3 (three) times daily.     . metFORMIN (GLUCOPHAGE-XR) 500 MG 24 hr tablet Take 500 mg by mouth daily with supper.     . metoprolol succinate (TOPROL-XL) 50 MG 24 hr tablet Take 50 mg by mouth daily. Take with or immediately following a meal.    . Polyethyl Glycol-Propyl Glycol (SYSTANE OP) Place 1 drop into both eyes 2 (two) times daily as needed (dry eyes).     No current facility-administered medications on file prior to visit.    No Known Allergies  Family History  Problem Relation Age of Onset  . Heart disease Mother   . Stroke Father   . Diabetes Sister     BP 142/88 mmHg  Pulse 92  Temp(Src) 98.1 F (36.7 C) (Oral)  Wt 269 lb (122.018 kg)  SpO2 97%    Review of Systems Denies weight change    Objective:   Physical Exam VITAL SIGNS:  See vs page GENERAL: no distress Ext: left BKA  Pulses: right dorsalis pedis intact.  Right foot: no deformity. no edema  Skin: no ulcer on the right foot: normal  color and temp: healed surgical scar over the right medial malleolus (fx repair).  Neuro: sensation is intact to touch on the right foot, but decreased from normal.        Assessment & Plan:  DM: apparently well-controlled  Patient is advised the following: Patient Instructions  check your blood sugar once a day.  vary the time of day when you check, between before the 3 meals, and at bedtime.  also check if you have symptoms of your blood sugar being too high or too low.  please keep a record of the readings and bring it to your next appointment here.  You can write it on any piece of paper.  please call us sooner if your blood sugar goes below 70, or if you have a lot of readings over 200. blood tests are being requested for you today.  We'll contact you with results. If it is high, we'll add "[place]." Please come back for a follow-up appointment in 6 months.

## 2015-03-20 ENCOUNTER — Telehealth: Payer: Self-pay | Admitting: Endocrinology

## 2015-03-20 NOTE — Telephone Encounter (Signed)
Patient called stating that he would like to be referred to a Neurologist   Back and spine ([facility])  Please advise    Thank you

## 2015-03-20 NOTE — Telephone Encounter (Signed)
Please refer this request to PCP, as i only see pt for dm.

## 2015-03-24 LAB — HM DIABETES EYE EXAM

## 2015-04-08 ENCOUNTER — Telehealth: Payer: Self-pay | Admitting: Endocrinology

## 2015-04-08 DIAGNOSIS — G63 Polyneuropathy in diseases classified elsewhere: Secondary | ICD-10-CM | POA: Insufficient documentation

## 2015-04-08 NOTE — Telephone Encounter (Signed)
See note below and please advise, Thanks! 

## 2015-04-08 NOTE — Telephone Encounter (Signed)
Pt called requesting Dr. Loanne Drilling give him a referral to a neuroologist

## 2015-04-08 NOTE — Telephone Encounter (Signed)
done

## 2015-04-22 ENCOUNTER — Ambulatory Visit (INDEPENDENT_AMBULATORY_CARE_PROVIDER_SITE_OTHER): Payer: Commercial Managed Care - HMO | Admitting: Endocrinology

## 2015-04-22 ENCOUNTER — Encounter: Payer: Self-pay | Admitting: Endocrinology

## 2015-04-22 VITALS — BP 132/85 | HR 97 | Temp 98.8°F | Ht 70.0 in | Wt 273.0 lb

## 2015-04-22 DIAGNOSIS — E119 Type 2 diabetes mellitus without complications: Secondary | ICD-10-CM

## 2015-04-22 LAB — POCT GLYCOSYLATED HEMOGLOBIN (HGB A1C): Hemoglobin A1C: 7.3

## 2015-04-22 MED ORDER — SITAGLIPTIN PHOSPHATE 100 MG PO TABS: 100.0000 mg | ORAL_TABLET | Freq: Every day | ORAL | Status: DC

## 2015-04-22 NOTE — Patient Instructions (Addendum)
check your blood sugar once a day.  vary the time of day when you check, between before the 3 meals, and at bedtime.  also check if you have symptoms of your blood sugar being too high or too low.  please keep a record of the readings and bring it to your next appointment here.  You can write it on any piece of paper.  please call us sooner if your blood sugar goes below 70, or if you have a lot of readings over 200.   Please come back for a follow-up appointment in 3 months.   i have sent a prescription to your pharmacy, to add "[place]."

## 2015-04-22 NOTE — Progress Notes (Signed)
Subjective:    Patient ID: Darrell Horne, male    DOB: 05-05-1946, 69 y.o.   MRN: [id number]  HPI Pt returns for f/u of diabetes mellitus: DM type: 2 Dx'ed: 2229 Complications: severe painful neuropathy of all 4 extremities, and left BKA.   Therapy: metformin DKA: never Severe hypoglycemia: never Pancreatitis: never Other: he has never been on insulin. Interval history: chronic low-back pain persists (he takes prednisone for this).  no cbg record, but states cbg's are in the high-100's.   Past Medical History  Diagnosis Date  . PAF (paroxysmal atrial fibrillation)   . DM2 (diabetes mellitus, type 2)   . HTN (hypertension)   . HLD (hyperlipidemia)   . Chest pain   . Palpitations   . Diabetes mellitus     Past Surgical History  Procedure Laterality Date  . Total ankle arthroplasty    . Cholecystectomy    . Shoulder surgery    . Hernia repair    . Appendectomy      History   Social History  . Marital Status: Married    Spouse Name: N/A  . Number of Children: 4  . Years of Education: N/A   Occupational History  . retired Administrator    Social History Main Topics  . Smoking status: Former Smoker    Quit date: 10/10/1981  . Smokeless tobacco: Not on file  . Alcohol Use: 4.2 oz/week    7 Cans of beer per week     Comment: here and there  . Drug Use: No  . Sexual Activity: Not on file   Other Topics Concern  . Not on file   Social History Narrative    Current Outpatient Prescriptions on File Prior to Visit  Medication Sig Dispense Refill  . amitriptyline (ELAVIL) 25 MG tablet Take 25 mg by mouth at bedtime.     Marland Kitchen apixaban (ELIQUIS) 5 MG TABS tablet Take 5 mg by mouth 2 (two) times daily.    Marland Kitchen aspirin EC 81 MG tablet Take 81 mg by mouth daily.    Marland Kitchen atorvastatin (LIPITOR) 40 MG tablet Take 40 mg by mouth daily.     Marland Kitchen b complex vitamins tablet Take 1 tablet by mouth daily.    . Cholecalciferol (VITAMIN D-3) 5000 UNITS TABS Take 5,000 Units by mouth daily.     Marland Kitchen gabapentin (NEURONTIN) 300 MG capsule Take 600 mg by mouth 3 (three) times daily.     . metFORMIN (GLUCOPHAGE-XR) 500 MG 24 hr tablet Take 500 mg by mouth daily with supper.     . metoprolol succinate (TOPROL-XL) 50 MG 24 hr tablet Take 50 mg by mouth daily. Take with or immediately following a meal.    . Polyethyl Glycol-Propyl Glycol (SYSTANE OP) Place 1 drop into both eyes 2 (two) times daily as needed (dry eyes).     No current facility-administered medications on file prior to visit.    No Known Allergies  Family History  Problem Relation Age of Onset  . Heart disease Mother   . Stroke Father   . Diabetes Sister     BP 132/85 mmHg  Pulse 97  Temp(Src) 98.8 F (37.1 C) (Oral)  Ht 5\' 10"  (1.778 m)  Wt 273 lb (123.832 kg)  BMI 39.17 kg/m2  SpO2 96%  Review of Systems He denies hypoglycemia    Objective:   Physical Exam VITAL SIGNS:  See vs page GENERAL: no distress Ext: left BKA  Pulses: right dorsalis pedis intact.  RLE: no deformity. 1+ leg edema  Skin: no ulcer on the right foot: normal color and temp: healed surgical scar over the right medial malleolus (fx repair).  Neuro: sensation is intact to touch on the right foot, but decreased from normal.  Ext: There is onychomycosis of the right toenails   A1c=7.2%    Assessment & Plan:  DM: he needs increased rx, if it can be done with a regimen that avoids or minimizes hypoglycemia.   Patient is advised the following: Patient Instructions  check your blood sugar once a day.  vary the time of day when you check, between before the 3 meals, and at bedtime.  also check if you have symptoms of your blood sugar being too high or too low.  please keep a record of the readings and bring it to your next appointment here.  You can write it on any piece of paper.  please call us sooner if your blood sugar goes below 70, or if you have a lot of readings over 200.   Please come back for a follow-up appointment in 3  months.   i have sent a prescription to your pharmacy, to add "[place]."

## 2015-05-07 ENCOUNTER — Other Ambulatory Visit: Payer: Self-pay | Admitting: Orthopedic Surgery

## 2015-05-07 DIAGNOSIS — M545 Low back pain, unspecified: Secondary | ICD-10-CM

## 2015-05-20 ENCOUNTER — Other Ambulatory Visit: Payer: Self-pay

## 2015-05-20 ENCOUNTER — Ambulatory Visit
Admission: RE | Admit: 2015-05-20 | Discharge: 2015-05-20 | Disposition: A | Discharge status: Home / Self Care | Payer: Commercial Managed Care - HMO | Source: Ambulatory Visit | Attending: Orthopedic Surgery | Admitting: Orthopedic Surgery

## 2015-05-20 DIAGNOSIS — M545 Low back pain, unspecified: Secondary | ICD-10-CM

## 2015-05-29 ENCOUNTER — Ambulatory Visit: Payer: Commercial Managed Care - HMO | Admitting: Neurology

## 2015-06-04 ENCOUNTER — Encounter: Payer: Self-pay | Admitting: Neurology

## 2015-06-04 ENCOUNTER — Ambulatory Visit (INDEPENDENT_AMBULATORY_CARE_PROVIDER_SITE_OTHER): Payer: Commercial Managed Care - HMO | Admitting: Neurology

## 2015-06-04 VITALS — BP 100/74 | HR 84 | Ht 70.0 in | Wt 272.2 lb

## 2015-06-04 DIAGNOSIS — R519 Headache, unspecified: Secondary | ICD-10-CM

## 2015-06-04 DIAGNOSIS — G621 Alcoholic polyneuropathy: Secondary | ICD-10-CM | POA: Diagnosis not present

## 2015-06-04 DIAGNOSIS — E1142 Type 2 diabetes mellitus with diabetic polyneuropathy: Secondary | ICD-10-CM

## 2015-06-04 DIAGNOSIS — R51 Headache: Secondary | ICD-10-CM

## 2015-06-04 MED ORDER — AMITRIPTYLINE HCL 75 MG PO TABS: 75.0000 mg | ORAL_TABLET | Freq: Every day | ORAL | Status: DC

## 2015-06-04 NOTE — Progress Notes (Signed)
[facility] Clinic Note - Initial Visit   Date: 06/04/2015  OMARRION CARMER MRN: [id number] DOB: 1946-07-31   Dear Dr. Garnette Scheuermann:  Thank you for your kind referral of MAHAMED ZALEWSKI for consultation of neuropathy. Although his history is well known to you, please allow [place] to reiterate it for the purpose of our medical record. The patient was accompanied to the clinic by ex-wife who also provides collateral information.     History of Present Illness: Darrell Horne is a 69 y.o. right-handed Caucasian male with diabetes mellitus type 2 s/p left BKA (2013), chronic low back pain, hypertension, paroxsymal atrial fibrillation (on Eliquis), and hyperlipidemia presenting for evaluation of neuropathy.    Beginning around late 1990s, he started having numbness and tingling of the feet which has progressed over the years.  He underwent left BKA in 2013.  Currently, he has numbness of the right foot and sharp pain involving the toes. Around 2013, he began noticing numbness of the finger tips.  He is dropping objects because he cannot feel things.  He denies any weakness of the hands.  He does not have a lot of tingling in the hands or feet.  He is taking gabapentin 600mg  TID and amitriptyline 50mg  qhs.  Nothing improves or exacerbates his pain. He has a 15 year history of alcoholism, but has been sober since 2013.   Three weeks ago, he reports falling off the bed and hit his head on the nightstand.  There was no loss consciousness.  Since then, he complains of dull right parietal headache which radiates down his neck and back. Pain lasts about 17min - 30 minutes and occurs daily.  He has tried tylenol but it does not provide relief.  No associated vision change, facial weakness, or dizziness.    He also has chronic low back pain and had a MRI lumbar spine ordered by his orthopeadic phyisican and is scheduled to see them for results tomorrow.     Out-side paper records, electronic  medical record, and images have been reviewed where available and summarized as:  Lab Results  Component Value Date   HGBA1C 7.3 04/22/2015   Lab Results  Component Value Date   TSH 1.318 06/19/2011    . Past Medical History  Diagnosis Date  . PAF (paroxysmal atrial fibrillation)   . DM2 (diabetes mellitus, type 2)   . HTN (hypertension)   . HLD (hyperlipidemia)   . Chest pain   . Palpitations   . Diabetes mellitus     Past Surgical History  Procedure Laterality Date  . Total ankle arthroplasty    . Cholecystectomy    . Shoulder surgery    . Hernia repair    . Appendectomy       Medications:  Outpatient Encounter Prescriptions as of 06/04/2015  Medication Sig Note  . apixaban (ELIQUIS) 5 MG TABS tablet Take 5 mg by mouth 2 (two) times daily.   Marland Kitchen aspirin EC 81 MG tablet Take 81 mg by mouth daily.   Marland Kitchen atorvastatin (LIPITOR) 40 MG tablet Take 40 mg by mouth daily.    Marland Kitchen b complex vitamins tablet Take 1 tablet by mouth daily.   . Cholecalciferol (VITAMIN D-3) 5000 UNITS TABS Take 5,000 Units by mouth daily.   Marland Kitchen gabapentin (NEURONTIN) 300 MG capsule Take 600 mg by mouth 3 (three) times daily.    . metFORMIN (GLUCOPHAGE-XR) 500 MG 24 hr tablet Take 500 mg by mouth daily with supper.    Marland Kitchen  metoprolol succinate (TOPROL-XL) 50 MG 24 hr tablet Take 50 mg by mouth daily. Take with or immediately following a meal.   . Polyethyl Glycol-Propyl Glycol (SYSTANE OP) Place 1 drop into both eyes 2 (two) times daily as needed (dry eyes).   . sitaGLIPtin (JANUVIA) 100 MG tablet Take 1 tablet (100 mg total) by mouth daily.   . [DISCONTINUED] amitriptyline (ELAVIL) 25 MG tablet Take 25 mg by mouth at bedtime.    Marland Kitchen amitriptyline (ELAVIL) 75 MG tablet Take 1 tablet (75 mg total) by mouth at bedtime.   . [DISCONTINUED] predniSONE (DELTASONE) 10 MG tablet  04/22/2015: Received from: External Pharmacy   No facility-administered encounter medications on file as of 06/04/2015.     Allergies: No  Known Allergies  Family History: Family History  Problem Relation Age of Onset  . Heart disease Mother   . Stroke Father   . Diabetes Sister     Social History: Social History  Substance Use Topics  . Smoking status: Former Smoker    Quit date: 10/10/1981  . Smokeless tobacco: Never Used  . Alcohol Use: No     Comment: Previously drinking a fifth of liquor every two days x 13 years   Social History   Social History Narrative   Lives alone in a one story home.  Has 4 children.  Retired Administrator.  Education: GED    Review of Systems:  CONSTITUTIONAL: No fevers, chills, night sweats, or weight loss.   EYES: No visual changes or eye pain ENT: No hearing changes.  No history of nose bleeds.   RESPIRATORY: No cough, wheezing and shortness of breath.   CARDIOVASCULAR: Negative for chest pain, and palpitations.   GI: Negative for abdominal discomfort, blood in stools or black stools.  No recent change in bowel habits.   GU:  No history of incontinence.   MUSCLOSKELETAL: +history of joint pain or swelling.  No myalgias.   SKIN: Negative for lesions, rash, and itching.   HEMATOLOGY/ONCOLOGY: Negative for prolonged bleeding, bruising easily, and swollen nodes.    ENDOCRINE: Negative for cold or heat intolerance, polydipsia or goiter.   PSYCH:  No depression or anxiety symptoms.   NEURO: As Above.   Vital Signs:  BP 100/74 mmHg  Pulse 84  Ht 5\' 10"  (1.778 m)  Wt 272 lb 3 oz (123.463 kg)  BMI 39.05 kg/m2  SpO2 96%   General Medical Exam:   General:  Well appearing, comfortable.   Eyes/ENT: see cranial nerve examination.   Neck: No masses appreciated.  Full range of motion without tenderness.  No carotid bruits. Respiratory:  Clear to auscultation, good air entry bilaterally.   Cardiac:  Regular rate and rhythm, no murmur.   Extremities:  Left BKA, onychomycosis of the right toe nails Skin:  No rashes or lesions.  Neurological Exam: MENTAL STATUS including orientation  to time, place, person, recent and remote memory, attention span and concentration, language, and fund of knowledge is normal.  Speech is not dysarthric.  CRANIAL NERVES: II:  No visual field defects.  Unremarkable fundi.   III-IV-VI: Pupils equal round and reactive to light.  Normal conjugate, extra-ocular eye movements in all directions of gaze.  No nystagmus.  No ptosis.   V:  Normal facial sensation.     VII:  Normal facial symmetry and movements.  No pathologic facial reflexes.  VIII:  Normal hearing and vestibular function.   IX-X:  Normal palatal movement.   XI:  Normal shoulder shrug  and head rotation.   XII:  Normal tongue strength and range of motion, no deviation or fasciculation.  MOTOR:  No atrophy, fasciculations or abnormal movements.  No pronator drift.  Tone is normal.    Right Upper Extremity:    Left Upper Extremity:    Deltoid  5/5   Deltoid  5/5   Biceps  5/5   Biceps  5/5   Triceps  5/5   Triceps  5/5   Wrist extensors  5/5   Wrist extensors  5/5   Wrist flexors  5/5   Wrist flexors  5/5   Finger extensors  5/5   Finger extensors  5/5   Finger flexors  5/5   Finger flexors  5/5   Dorsal interossei  4+/5  Dorsal interossei  4+/5   Abductor pollicis  4+/5   Abductor pollicis  4+/5   Tone (Ashworth scale)  0  Tone (Ashworth scale)  0   Right Lower Extremity:    Left Lower Extremity:    Hip flexors  5-/5   Hip flexors  5-/5   Hip extensors  5/5   Hip extensors  5/5   Knee flexors  5/5   S/p BKA   Knee extensors  5/5      Dorsiflexors  5/5      Plantarflexors  5/5      Toe extensors  5-/5      Toe flexors  5/5      Tone (Ashworth scale)  0      MSRs:  Right                                                                 Left brachioradialis 1+  brachioradialis 1+  biceps 1+  biceps 1+  triceps 1+  triceps 1+  patellar 0  Patellar 0  ankle jerk 0     plantar response mute      SENSORY:  Reduced sensation to all modalities in right foot and distally in the  hands.  COORDINATION/GAIT: Normal finger-to- nose- testing.  Unable to rise from a chair without using arms. Unsteady with stand and he has wide-based and mildly ataxic gait, assisted with cane.  Left leg with prosthesis   IMPRESSION: 1.  Peripheral neuropathy due to diabetes and alcoholism (sober since 2013)  - Exam with glove stocking distribution of sensory loss as well as gait ataxia  - Risk factors:  Diabetes and significant alcohol history  - Symptoms manifesting predominately with numbness and explained that medications are most helpful with painful paresthesias  - Encouraged him to maintain tight glycemic control and praised him for alcohol cessation  - Unable to increase gabapentin due to him feeling sleepy on 600mg  TID  2.  New onset headaches following blunt head injury, no LOC  - Recommended brain imaging to be sure we are not missing anything worrisome, but patient not interested  - Increase amitriptyline to 75mg  qhs   PLAN/RECOMMENDATIONS:  1.  Check vitamin B12, vitamin B1, copper 2.  Increase amitriptyline to 75mg  daily 3.  Fall precautions discussed, recommend using a rollator 4.  If headaches do not improve, proceed with MRI brain and/or neck physical therapy  Return to clinic as needed   The duration of this appointment  visit was 50 minutes of face-to-face time with the patient.  Greater than 50% of this time was spent in counseling, explanation of diagnosis, planning of further management, and coordination of care.   Thank you for allowing me to participate in patient's care.  If I can answer any additional questions, I would be pleased to do so.    Sincerely,    Izaih Kataoka K. Posey Pronto, DO

## 2015-06-04 NOTE — Patient Instructions (Addendum)
1.  Check bloodwork 2.  Increase amitriptyline to 75mg  daily 3.  If your headaches do not improve, call my office to schedule MRI brain and/or neck physical therapy  Return to clinic as needed

## 2015-06-15 NOTE — Progress Notes (Signed)
Cardiology Office Note   Date:  06/16/2015   ID:  Darrell Horne, DOB May 19, 1946, MRN [id number]  PCP:  Wenda Low, MD  Cardiologist:   Sharol Harness, MD   Chief Complaint  Patient presents with  . New Evaluation    problem with headaches, no chest pain ,little right side  pain with breathing, no swelling in right leg  . Shortness of Breath      History of Present Illness: Darrell Horne is a 69 y.o. male with paroxysmal atrial fibrillation, hypertension, hyperlipidemia and diabetes type 2 who presents for an evaluation of shortness of breath.   Mr. Darrell Horne was referred by his primary care physician [name] due to complaints of shortness of breath. Mr. Darrell Horne endorses dyspnea on exertion with walking. However he states that he usually is limited by back pain. He endorses occasional chest pain at rest that does not occur with exertion. He denies any palpitations, lightheadedness, dizziness. He also endorses right lower extremity edema that has been long-standing. The edema improves by morning after he elevates his leg.  Mr. Darrell Horne main complaint today is headache. He fell approximate 4-5 weeks ago. There was no loss of consciousness. He was evaluated by neurology on 06/04/15. At that time they recommended that he increase his amitriptyline and wanted to obtain imaging. He did not initially remember this encounter but with prompting states that he does recall Dr. Posey Pronto recommending imaging. He is interested in getting imaging at this time as he now has persistent headache.  Past Medical History  Diagnosis Date  . PAF (paroxysmal atrial fibrillation)   . DM2 (diabetes mellitus, type 2)   . HTN (hypertension)   . HLD (hyperlipidemia)   . Chest pain   . Palpitations   . Diabetes mellitus     Past Surgical History  Procedure Laterality Date  . Total ankle arthroplasty    . Cholecystectomy    . Shoulder surgery    . Hernia repair    . Appendectomy       Current  Outpatient Prescriptions  Medication Sig Dispense Refill  . amitriptyline (ELAVIL) 75 MG tablet Take 1 tablet (75 mg total) by mouth at bedtime. 90 tablet 3  . apixaban (ELIQUIS) 5 MG TABS tablet Take 5 mg by mouth 2 (two) times daily.    Marland Kitchen aspirin EC 81 MG tablet Take 81 mg by mouth daily.    Marland Kitchen atorvastatin (LIPITOR) 40 MG tablet Take 40 mg by mouth daily.     Marland Kitchen b complex vitamins tablet Take 1 tablet by mouth daily.    . Cholecalciferol (VITAMIN D-3) 5000 UNITS TABS Take 5,000 Units by mouth once a week.     . gabapentin (NEURONTIN) 300 MG capsule Take 600 mg by mouth 3 (three) times daily.     . metFORMIN (GLUCOPHAGE-XR) 500 MG 24 hr tablet Take 500 mg by mouth daily with supper.     . metoprolol succinate (TOPROL-XL) 50 MG 24 hr tablet Take 50 mg by mouth daily. Take with or immediately following a meal.    . Polyethyl Glycol-Propyl Glycol (SYSTANE OP) Place 1 drop into both eyes 2 (two) times daily as needed (dry eyes).    . sitaGLIPtin (JANUVIA) 100 MG tablet Take 1 tablet (100 mg total) by mouth daily. 90 tablet 3   No current facility-administered medications for this visit.    Allergies:   Review of patient's allergies indicates no known allergies.    Social History:  The  patient  reports that he quit smoking about 33 years ago. He has never used smokeless tobacco. He reports that he does not drink alcohol or use illicit drugs.   Family History:  The patient's family history includes Diabetes in his sister; Heart disease in his mother; Stroke in his father.    ROS:  Please see the history of present illness.   Otherwise, review of systems are positive for none.   All other systems are reviewed and negative.    PHYSICAL EXAM: VS:  BP 116/70 mmHg  Pulse 83  Ht 5\' 10"  (1.778 m)  Wt 124.467 kg (274 lb 6.4 oz)  BMI 39.37 kg/m2 , BMI Body mass index is 39.37 kg/(m^2). GENERAL:  Well appearing HEENT:  Pupils equal round and reactive, fundi not visualized, oral mucosa  unremarkable NECK:  No jugular venous distention, waveform within normal limits, carotid upstroke brisk and symmetric, no bruits, no thyromegaly LYMPHATICS:  No cervical adenopathy LUNGS:  Clear to auscultation bilaterally HEART:  RRR.  PMI not displaced or sustained,S1 and S2 within normal limits, no S3, no S4, no clicks, no rubs, no murmurs ABD:  Flat, positive bowel sounds normal in frequency in pitch, no bruits, no rebound, no guarding, no midline pulsatile mass, no hepatomegaly, no splenomegaly EXT:  2 plus pulses throughout.  L BKA.  1+ pitting edema to the R mid tibia.,  SKIN:  No rashes no nodules NEURO:  Cranial nerves II through XII grossly intact, motor grossly intact throughout PSYCH:  Cognitively intact, oriented to person place and time.  Appears confused several times throughout exam.    EKG:  EKG is ordered today. The ekg ordered today demonstrates sinus rhyhtm at 83 bpm.  LBBB.  First degree AV block.  QT prolongation.  LBBB new from prior 06/2014.   Recent Labs: 07/07/2014: ALT 18; BUN 5*; Creatinine, Ser 0.60; Hemoglobin 13.9; Platelets 146*; Potassium 4.1; Sodium 133*    Lipid Panel    Component Value Date/Time   CHOL 137 08/11/2014 0958   TRIG 70.0 08/11/2014 0958   HDL 55.40 08/11/2014 0958   CHOLHDL 2 08/11/2014 0958   VLDL 14.0 08/11/2014 0958   LDLCALC 68 08/11/2014 0958      Wt Readings from Last 3 Encounters:  06/16/15 124.467 kg (274 lb 6.4 oz)  06/04/15 123.463 kg (272 lb 3 oz)  04/22/15 123.832 kg (273 lb)      Other studies Reviewed: Additional studies/ records that were reviewed today include: . Review of the above records demonstrates:  Please see elsewhere in the note.     ASSESSMENT AND PLAN:  # Shortness of breath: Symptoms could be due to ischemia.  I'm especially concerned given the new LBBB on ECG and the fact that he has diabetes. - Lexiscan Myoview - Continue ASA 81mg  daily - continue metoprolol succinate 50mg  daily  # Atrial  fibrillation: Currently in sinus rhythm.   - Continue metoprolol and apixaban.  This patients CHA2DS2-VASc Score and unadjusted Ischemic Stroke Rate (% per year) is equal to 3.2 % stroke rate/year from a score of 3  Above score calculated as 1 point each if present [CHF, HTN, DM, Vascular=MI/PAD/Aortic Plaque, Age if 65-74, or Male] Above score calculated as 2 points each if present [Age > 75, or Stroke/TIA/TE]   # Hyperlipidemia: Managed by PCP. - Continue atorvastatin 40mg  daily.   Current medicines are reviewed at length with the patient today.  The patient does not have concerns regarding medicines.  The following changes  have been made:  no change  Labs/ tests ordered today include:   Orders Placed This Encounter  Procedures  . Myocardial Perfusion Imaging  . EKG 12-Lead     Disposition:   FU with Dr. Jonelle Sidle C. Oval Linsey in 1 year.    Signed, Sharol Harness, MD  06/16/2015 5:23 PM    [place]

## 2015-06-16 ENCOUNTER — Encounter: Payer: Self-pay | Admitting: Cardiovascular Disease

## 2015-06-16 ENCOUNTER — Ambulatory Visit (INDEPENDENT_AMBULATORY_CARE_PROVIDER_SITE_OTHER): Payer: Commercial Managed Care - HMO | Admitting: Cardiovascular Disease

## 2015-06-16 VITALS — BP 116/70 | HR 83 | Ht 70.0 in | Wt 274.4 lb

## 2015-06-16 DIAGNOSIS — I447 Left bundle-branch block, unspecified: Secondary | ICD-10-CM

## 2015-06-16 DIAGNOSIS — R002 Palpitations: Secondary | ICD-10-CM | POA: Diagnosis not present

## 2015-06-16 DIAGNOSIS — R0602 Shortness of breath: Secondary | ICD-10-CM

## 2015-06-16 DIAGNOSIS — I10 Essential (primary) hypertension: Secondary | ICD-10-CM

## 2015-06-16 DIAGNOSIS — I480 Paroxysmal atrial fibrillation: Secondary | ICD-10-CM | POA: Diagnosis not present

## 2015-06-16 NOTE — Patient Instructions (Signed)
Your physician has requested that you have a lexiscan myoview. For further information please visit [email or web address]. Please follow instruction sheet, as given.  NO OTHER CHANGES SAT CURRENT TIME.  WILL CONTACT YOU WITH RESULTS  Your physician wants you to follow-up in [place].  You will receive a reminder letter in the mail two months in advance. If you don't receive a letter, please call our office to schedule the follow-up appointment.

## 2015-06-18 ENCOUNTER — Telehealth (HOSPITAL_COMMUNITY): Payer: Self-pay

## 2015-06-18 NOTE — Telephone Encounter (Signed)
Encounter complete. 

## 2015-06-23 ENCOUNTER — Ambulatory Visit (HOSPITAL_COMMUNITY)
Admission: RE | Admit: 2015-06-23 | Discharge: 2015-06-23 | Disposition: A | Discharge status: Home / Self Care | Payer: Commercial Managed Care - HMO | Source: Ambulatory Visit | Attending: Cardiology | Admitting: Cardiology

## 2015-06-23 DIAGNOSIS — R002 Palpitations: Secondary | ICD-10-CM

## 2015-06-23 DIAGNOSIS — I10 Essential (primary) hypertension: Secondary | ICD-10-CM

## 2015-06-23 DIAGNOSIS — I447 Left bundle-branch block, unspecified: Secondary | ICD-10-CM | POA: Insufficient documentation

## 2015-06-23 DIAGNOSIS — R0602 Shortness of breath: Secondary | ICD-10-CM

## 2015-06-23 DIAGNOSIS — I480 Paroxysmal atrial fibrillation: Secondary | ICD-10-CM | POA: Insufficient documentation

## 2015-06-23 LAB — MYOCARDIAL PERFUSION IMAGING
LV dias vol: 100 mL
LV sys vol: 34 mL
Peak HR: 83 {beats}/min
Rest HR: 69 {beats}/min
SDS: 0
SRS: 1
SSS: 1
TID: 1.13

## 2015-06-23 MED ORDER — REGADENOSON 0.4 MG/5ML IV SOLN
0.4000 mg | Freq: Once | INTRAVENOUS | Status: AC
  Administered 2015-06-23: 0.4 mg via INTRAVENOUS

## 2015-06-23 MED ORDER — TECHNETIUM TC 99M SESTAMIBI GENERIC - CARDIOLITE
10.9000 | Freq: Once | INTRAVENOUS | Status: AC | PRN
  Administered 2015-06-23: 10.9 via INTRAVENOUS

## 2015-06-23 MED ORDER — TECHNETIUM TC 99M SESTAMIBI GENERIC - CARDIOLITE
30.2000 | Freq: Once | INTRAVENOUS | Status: AC | PRN
  Administered 2015-06-23: 32.6 via INTRAVENOUS

## 2015-06-25 ENCOUNTER — Telehealth: Payer: Self-pay | Admitting: *Deleted

## 2015-06-25 DIAGNOSIS — R0600 Dyspnea, unspecified: Secondary | ICD-10-CM

## 2015-06-25 DIAGNOSIS — R0609 Other forms of dyspnea: Principal | ICD-10-CM

## 2015-06-25 DIAGNOSIS — I480 Paroxysmal atrial fibrillation: Secondary | ICD-10-CM

## 2015-06-25 DIAGNOSIS — R002 Palpitations: Secondary | ICD-10-CM

## 2015-06-25 DIAGNOSIS — I10 Essential (primary) hypertension: Secondary | ICD-10-CM

## 2015-06-25 NOTE — Telephone Encounter (Signed)
-----   Message from Skeet Latch, MD sent at 06/23/2015 11:54 PM EDT ----- Normal stress test.  This does not explain his shortness of breath.  Please order echo to assess for diastolic dysfunction or structural heart disease.

## 2015-06-25 NOTE — Telephone Encounter (Signed)
Left message to call back  

## 2015-06-26 NOTE — Telephone Encounter (Signed)
Darrell Horne is returning a call about his test results . Please call

## 2015-06-26 NOTE — Telephone Encounter (Signed)
Pt notified of results, also of pending echo. Understanding verbalized.

## 2015-07-03 ENCOUNTER — Ambulatory Visit (HOSPITAL_COMMUNITY): Payer: Commercial Managed Care - HMO | Attending: Cardiology

## 2015-07-03 ENCOUNTER — Other Ambulatory Visit: Payer: Self-pay

## 2015-07-03 DIAGNOSIS — E785 Hyperlipidemia, unspecified: Secondary | ICD-10-CM | POA: Insufficient documentation

## 2015-07-03 DIAGNOSIS — R0609 Other forms of dyspnea: Secondary | ICD-10-CM | POA: Diagnosis not present

## 2015-07-03 DIAGNOSIS — E669 Obesity, unspecified: Secondary | ICD-10-CM | POA: Insufficient documentation

## 2015-07-03 DIAGNOSIS — I480 Paroxysmal atrial fibrillation: Secondary | ICD-10-CM

## 2015-07-03 DIAGNOSIS — I10 Essential (primary) hypertension: Secondary | ICD-10-CM

## 2015-07-03 DIAGNOSIS — R0600 Dyspnea, unspecified: Secondary | ICD-10-CM

## 2015-07-03 DIAGNOSIS — R002 Palpitations: Secondary | ICD-10-CM

## 2015-07-03 DIAGNOSIS — I071 Rheumatic tricuspid insufficiency: Secondary | ICD-10-CM | POA: Insufficient documentation

## 2015-07-03 DIAGNOSIS — I059 Rheumatic mitral valve disease, unspecified: Secondary | ICD-10-CM | POA: Diagnosis not present

## 2015-07-03 DIAGNOSIS — I5189 Other ill-defined heart diseases: Secondary | ICD-10-CM | POA: Insufficient documentation

## 2015-07-03 DIAGNOSIS — E119 Type 2 diabetes mellitus without complications: Secondary | ICD-10-CM | POA: Insufficient documentation

## 2015-07-03 DIAGNOSIS — Z6838 Body mass index (BMI) 38.0-38.9, adult: Secondary | ICD-10-CM | POA: Diagnosis not present

## 2015-07-03 DIAGNOSIS — I340 Nonrheumatic mitral (valve) insufficiency: Secondary | ICD-10-CM | POA: Diagnosis not present

## 2015-07-03 DIAGNOSIS — I351 Nonrheumatic aortic (valve) insufficiency: Secondary | ICD-10-CM | POA: Insufficient documentation

## 2015-07-06 ENCOUNTER — Telehealth: Payer: Self-pay | Admitting: Cardiovascular Disease

## 2015-07-06 NOTE — Telephone Encounter (Signed)
Pt is  Returning Darrell Horne's call about some test results. Please call back  Thanks

## 2015-07-08 ENCOUNTER — Telehealth: Payer: Self-pay | Admitting: *Deleted

## 2015-07-08 ENCOUNTER — Ambulatory Visit: Payer: Commercial Managed Care - HMO | Attending: Orthopedic Surgery

## 2015-07-08 NOTE — Telephone Encounter (Signed)
Patient returning a call from Trixie Dredge, RN

## 2015-07-08 NOTE — Telephone Encounter (Signed)
-----   Message from Skeet Latch, MD sent at 07/06/2015 10:58 PM EDT ----- Echo showed moderate diastolic dysfunction.  This means the heart is stiff and does not relax well.  It will be important to control his heart rate and blood pressure.

## 2015-07-08 NOTE — Telephone Encounter (Signed)
LEFT MESSAGE TO CALL BACK

## 2015-07-08 NOTE — Telephone Encounter (Signed)
SEE OTHER NOTE

## 2015-07-08 NOTE — Telephone Encounter (Signed)
Patient called with results of echo.  Patient without questions

## 2015-07-08 NOTE — Telephone Encounter (Signed)
Left message to call back  

## 2015-07-16 ENCOUNTER — Ambulatory Visit (INDEPENDENT_AMBULATORY_CARE_PROVIDER_SITE_OTHER): Payer: Commercial Managed Care - HMO | Admitting: Neurology

## 2015-07-16 ENCOUNTER — Encounter: Payer: Self-pay | Admitting: Neurology

## 2015-07-16 ENCOUNTER — Other Ambulatory Visit (INDEPENDENT_AMBULATORY_CARE_PROVIDER_SITE_OTHER): Payer: Commercial Managed Care - HMO

## 2015-07-16 VITALS — BP 130/64 | HR 75 | Wt 271.1 lb

## 2015-07-16 DIAGNOSIS — G44321 Chronic post-traumatic headache, intractable: Secondary | ICD-10-CM

## 2015-07-16 DIAGNOSIS — E1142 Type 2 diabetes mellitus with diabetic polyneuropathy: Secondary | ICD-10-CM

## 2015-07-16 DIAGNOSIS — R51 Headache: Secondary | ICD-10-CM

## 2015-07-16 DIAGNOSIS — G621 Alcoholic polyneuropathy: Secondary | ICD-10-CM

## 2015-07-16 DIAGNOSIS — R519 Headache, unspecified: Secondary | ICD-10-CM

## 2015-07-16 LAB — C-REACTIVE PROTEIN: CRP: 1.2 mg/dL (ref 0.5–20.0)

## 2015-07-16 LAB — SEDIMENTATION RATE: Sed Rate: 21 mm/hr (ref 0–22)

## 2015-07-16 LAB — VITAMIN B12: Vitamin B-12: 618 pg/mL (ref 211–911)

## 2015-07-16 MED ORDER — TOPIRAMATE 25 MG PO TABS: 25.0000 mg | ORAL_TABLET | Freq: Every day | ORAL | Status: DC

## 2015-07-16 NOTE — Patient Instructions (Addendum)
1.  Reduce amitriptyline to one-half tablet at bedtime for two weeks, then stop. 2.  Start topamax 25mg  daily 3.  MRI brain and vessels 4.  Check blood work 5.  Call with update in one month 6.  Return to clinic in 3 months

## 2015-07-16 NOTE — Progress Notes (Signed)
Follow-up Visit   Date: 07/16/2015    Darrell Horne MRN: [id number] DOB: January 19, 1946   Interim History: Darrell Horne is a 69 y.o. right-handed Caucasian male with diabetes mellitus type 2 s/p left BKA (2013), chronic low back pain, hypertension, paroxsymal atrial fibrillation (on Eliquis), and hyperlipidemia returning to the clinic for follow-up of neuropathy and new complaints of headaches.  The patient was accompanied to the clinic by friend who also provides collateral information.    History of present illness: Beginning around late 1990s, he started having numbness and tingling of the feet which has progressed over the years. He underwent left BKA in 2013. Currently, he has numbness of the right foot and sharp pain involving the toes. Around 2013, he began noticing numbness of the finger tips. He is dropping objects because he cannot feel things. He denies any weakness of the hands. He does not have a lot of tingling in the hands or feet. He is taking gabapentin 667m TID and amitriptyline 526mqhs. Nothing improves or exacerbates his pain. He has a 15 year history of alcoholism, but has been sober since 2013.   Three weeks ago, he reports falling off the bed and hit his head on the nightstand. There was no loss consciousness. Since then, he complains of dull right parietal headache which radiates down his neck and back. Pain lasts about 34m74m- 30 minutes and occurs daily. He has tried tylenol but it does not provide relief. No associated vision change, facial weakness, or dizziness.   He also has chronic low back pain and had a MRI lumbar spine ordered by his orthopeadic physican.    UPDATE 07/16/2015:   Headaches have become worsen and variable in location and did not respond to increasing amitriptyline to 734m63mHe is not taking anything over the counter for the pain.  He endorses lacrimation of the left nares, but has worse headaches on the right side.    Medications:   Current Outpatient Prescriptions on File Prior to Visit  Medication Sig Dispense Refill  . apixaban (ELIQUIS) 5 MG TABS tablet Take 5 mg by mouth 2 (two) times daily.    . asMarland Kitchenirin EC 81 MG tablet Take 81 mg by mouth daily.    . atMarland Kitchenrvastatin (LIPITOR) 40 MG tablet Take 40 mg by mouth daily.     . b Marland Kitchenomplex vitamins tablet Take 1 tablet by mouth daily.    . Cholecalciferol (VITAMIN D-3) 5000 UNITS TABS Take 5,000 Units by mouth once a week.     . gabapentin (NEURONTIN) 300 MG capsule Take 600 mg by mouth 3 (three) times daily.     . metFORMIN (GLUCOPHAGE-XR) 500 MG 24 hr tablet Take 500 mg by mouth daily with supper.     . metoprolol succinate (TOPROL-XL) 50 MG 24 hr tablet Take 50 mg by mouth daily. Take with or immediately following a meal.    . Polyethyl Glycol-Propyl Glycol (SYSTANE OP) Place 1 drop into both eyes 2 (two) times daily as needed (dry eyes).    . sitaGLIPtin (JANUVIA) 100 MG tablet Take 1 tablet (100 mg total) by mouth daily. 90 tablet 3   No current facility-administered medications on file prior to visit.    Allergies: No Known Allergies  Review of Systems:  CONSTITUTIONAL: No fevers, chills, night sweats, or weight loss.  EYES: No visual changes or eye pain ENT: No hearing changes.  No history of nose bleeds.   RESPIRATORY: No cough, wheezing and  shortness of breath.   CARDIOVASCULAR: Negative for chest pain, and palpitations.   GI: Negative for abdominal discomfort, blood in stools or black stools.  No recent change in bowel habits.   GU:  No history of incontinence.   MUSCLOSKELETAL: No history of joint pain or swelling.  No myalgias.   SKIN: Negative for lesions, rash, and itching.   ENDOCRINE: Negative for cold or heat intolerance, polydipsia or goiter.   PSYCH:  No depression or anxiety symptoms.   NEURO: As Above.   Vital Signs:  BP 130/64 mmHg  Pulse 75  Wt 271 lb 2 oz (122.981 kg)  SpO2 99%  Neurological Exam: MENTAL STATUS including orientation to  time, place, person, recent and remote memory, attention span and concentration, language, and fund of knowledge is normal.  Speech is not dysarthric.  CRANIAL NERVES: Pupils equal round and reactive to light.  Normal conjugate, extra-ocular eye movements in all directions of gaze.  No ptosis. Normal facial sensation.  Face is symmetric. Palate elevates symmetrically.  Tongue is midline.  MOTOR:  Left BKA.  Motor strength is 5/5 upper extremities, except 4+/5 intrinsic hand muscles.  There is mild weakness with bilateral hip flexion (5-/5).    MSRs:  Reflexes are 1+/4 in upper extremities and absent in the leg.  SENSORY:  Reduced sensation in a glove-stocking distribution.  COORDINATION/GAIT:  Finger to nose testing intact.  Wide-based, midly ataxic gait.  Data: Lab Results  Component Value Date   HGBA1C 7.3 04/22/2015     IMPRESSION/PLAN: 1. Peripheral neuropathy due to diabetes and alcoholism (sober since 2013) affecting glove-stocking distribution - Risk factors: Diabetes and significant alcohol history - Symptoms manifesting predominately with numbness and explained that medications are most helpful with painful paresthesias - Encouraged him to maintain tight glycemic control - Unable to increase gabapentin due to him feeling sleepy on 671m TID  2. New onset headaches following blunt head injury, no LOC - Recommended brain imaging to exclude intracranial pathology such as aneurysm or SDH - Slowly taper off amitriptyline due to ineffectiveness and start topiramate   PLAN/RECOMMENDATIONS:  1.  MRI brain, MRA head and neck 2.  Check ESR, CRP, vitamin B12, vitamin B1, copper 3.  Reduce amitriptyline to 716mhalf tablet for two weeks, then stop. 4.  Start topiramate 2517maily 5.  Return to clinic in 3 months   The duration of this appointment visit was 25 minutes of face-to-face time with the patient.   Greater than 50% of this time was spent in counseling, explanation of diagnosis, planning of further management, and coordination of care.   Thank you for allowing me to participate in patient's care.  If I can answer any additional questions, I would be pleased to do so.    Sincerely,     K. PatPosey ProntoO

## 2015-07-17 NOTE — Telephone Encounter (Signed)
Spoke to patient. Result given . Verbalized understanding  

## 2015-07-19 LAB — COPPER, SERUM: Copper: 116 ug/dL (ref 70–175)

## 2015-07-21 LAB — VITAMIN B1: Vitamin B1 (Thiamine): 12 nmol/L (ref 8–30)

## 2015-07-23 ENCOUNTER — Encounter: Payer: Self-pay | Admitting: Endocrinology

## 2015-07-23 ENCOUNTER — Ambulatory Visit (INDEPENDENT_AMBULATORY_CARE_PROVIDER_SITE_OTHER): Payer: Commercial Managed Care - HMO | Admitting: Endocrinology

## 2015-07-23 VITALS — BP 128/70 | HR 79 | Temp 98.9°F | Ht 70.0 in | Wt 272.0 lb

## 2015-07-23 DIAGNOSIS — E119 Type 2 diabetes mellitus without complications: Secondary | ICD-10-CM

## 2015-07-23 LAB — POCT GLYCOSYLATED HEMOGLOBIN (HGB A1C): Hemoglobin A1C: 6

## 2015-07-23 NOTE — Patient Instructions (Addendum)
check your blood sugar once a day.  vary the time of day when you check, between before the 3 meals, and at bedtime.  also check if you have symptoms of your blood sugar being too high or too low.  please keep a record of the readings and bring it to your next appointment here.  You can write it on any piece of paper.  please call us sooner if your blood sugar goes below 70, or if you have a lot of readings over 200.   Please continue the same medications for diabetes.   Please come back for a follow-up appointment in 4-5 months.

## 2015-07-23 NOTE — Progress Notes (Signed)
Subjective:    Patient ID: Darrell Horne, male    DOB: 13-Dec-1945, 69 y.o.   MRN: [id number]  HPI Pt returns for f/u of diabetes mellitus: DM type: 2 Dx'ed: 3893 Complications: severe painful neuropathy of all 4 extremities, and left BKA.   Therapy: 2 oral meds DKA: never Severe hypoglycemia: never Pancreatitis: never Other: he has never been on insulin. Interval history: no cbg record, but states cbg's are in the low-100's.  pt states he feels well in general. Past Medical History  Diagnosis Date  . PAF (paroxysmal atrial fibrillation) ([place])   . DM2 (diabetes mellitus, type 2) ([place])   . HTN (hypertension)   . HLD (hyperlipidemia)   . Chest pain   . Palpitations   . Diabetes mellitus     Past Surgical History  Procedure Laterality Date  . Total ankle arthroplasty    . Cholecystectomy    . Shoulder surgery    . Hernia repair    . Appendectomy      Social History   Social History  . Marital Status: Married    Spouse Name: N/A  . Number of Children: 4  . Years of Education: N/A   Occupational History  . retired Administrator    Social History Main Topics  . Smoking status: Former Smoker    Quit date: 10/10/1981  . Smokeless tobacco: Never Used  . Alcohol Use: No     Comment: Previously drinking a fifth of liquor every two days x 13 years  . Drug Use: No  . Sexual Activity: Not on file   Other Topics Concern  . Not on file   Social History Narrative   Lives alone in a one story home.  Has 4 children.  Retired Administrator.  Education: GED    Current Outpatient Prescriptions on File Prior to Visit  Medication Sig Dispense Refill  . apixaban (ELIQUIS) 5 MG TABS tablet Take 5 mg by mouth 2 (two) times daily.    Marland Kitchen aspirin EC 81 MG tablet Take 81 mg by mouth daily.    Marland Kitchen atorvastatin (LIPITOR) 40 MG tablet Take 40 mg by mouth daily.     Marland Kitchen b complex vitamins tablet Take 1 tablet by mouth daily.    . Cholecalciferol (VITAMIN D-3) 5000 UNITS TABS Take 5,000  Units by mouth once a week.     . gabapentin (NEURONTIN) 300 MG capsule Take 600 mg by mouth 3 (three) times daily.     . metFORMIN (GLUCOPHAGE-XR) 500 MG 24 hr tablet Take 500 mg by mouth daily with supper.     . metoprolol succinate (TOPROL-XL) 50 MG 24 hr tablet Take 50 mg by mouth daily. Take with or immediately following a meal.    . Polyethyl Glycol-Propyl Glycol (SYSTANE OP) Place 1 drop into both eyes 2 (two) times daily as needed (dry eyes).    . sitaGLIPtin (JANUVIA) 100 MG tablet Take 1 tablet (100 mg total) by mouth daily. 90 tablet 3  . topiramate (TOPAMAX) 25 MG tablet Take 1 tablet (25 mg total) by mouth daily. 90 tablet 3   No current facility-administered medications on file prior to visit.    No Known Allergies  Family History  Problem Relation Age of Onset  . Heart disease Mother   . Stroke Father   . Diabetes Sister     BP 128/70 mmHg  Pulse 79  Temp(Src) 98.9 F (37.2 C) (Oral)  Ht 5\' 10"  (1.778 m)  Wt 272  lb (123.378 kg)  BMI 39.03 kg/m2  SpO2 97%    Review of Systems He denies hypoglycemia.     Objective:   Physical Exam VITAL SIGNS:  See vs page GENERAL: no distress.  In wheelchair Ext: left BKA  Pulses: right dorsalis pedis intact.  RLE: no deformity. 1+ leg edema  Skin: no ulcer on the right foot: normal color and temp: healed surgical scar over the right medial malleolus (fx repair).  Neuro: sensation is intact to touch on the right foot, but decreased from normal.  Ext: There is onychomycosis of the right toenails    A1c=6.0%    Assessment & Plan:  DM: well-controlled.   Patient is advised the following: Patient Instructions  check your blood sugar once a day.  vary the time of day when you check, between before the 3 meals, and at bedtime.  also check if you have symptoms of your blood sugar being too high or too low.  please keep a record of the readings and bring it to your next appointment here.  You can write it on any piece of  paper.  please call us sooner if your blood sugar goes below 70, or if you have a lot of readings over 200.   Please continue the same medications for diabetes.   Please come back for a follow-up appointment in 4-5 months.

## 2015-08-05 ENCOUNTER — Ambulatory Visit
Admission: RE | Admit: 2015-08-05 | Discharge: 2015-08-05 | Disposition: A | Discharge status: Home / Self Care | Payer: Commercial Managed Care - HMO | Source: Ambulatory Visit | Attending: Neurology | Admitting: Neurology

## 2015-08-05 DIAGNOSIS — G621 Alcoholic polyneuropathy: Secondary | ICD-10-CM

## 2015-08-05 DIAGNOSIS — R519 Headache, unspecified: Secondary | ICD-10-CM

## 2015-08-05 DIAGNOSIS — R51 Headache: Secondary | ICD-10-CM

## 2015-08-05 DIAGNOSIS — E1142 Type 2 diabetes mellitus with diabetic polyneuropathy: Secondary | ICD-10-CM

## 2015-08-05 DIAGNOSIS — G44321 Chronic post-traumatic headache, intractable: Secondary | ICD-10-CM

## 2015-08-07 ENCOUNTER — Telehealth: Payer: Self-pay | Admitting: Endocrinology

## 2015-08-07 NOTE — Telephone Encounter (Signed)
Left voicemail advising of note below. Requested call back from the pt if he would like to discuss.

## 2015-08-07 NOTE — Telephone Encounter (Signed)
please call patient: As your last blood sugar was really good, try just stopping the januvia, and continue the metformin Please come back for a follow-up appointment in 3 months.

## 2015-08-07 NOTE — Telephone Encounter (Signed)
Patient stated that his prescription for Darrell Horne is 400 dollars, can't afford it, is there another alternative. Please advise

## 2015-08-07 NOTE — Telephone Encounter (Signed)
See note below and please advise, Thanks! 

## 2015-08-18 ENCOUNTER — Other Ambulatory Visit: Payer: Self-pay | Admitting: Orthopedic Surgery

## 2015-08-18 DIAGNOSIS — M25512 Pain in left shoulder: Secondary | ICD-10-CM

## 2015-08-25 ENCOUNTER — Other Ambulatory Visit: Payer: Self-pay | Admitting: Orthopedic Surgery

## 2015-08-25 DIAGNOSIS — M545 Low back pain: Secondary | ICD-10-CM

## 2015-08-28 ENCOUNTER — Ambulatory Visit
Admission: RE | Admit: 2015-08-28 | Discharge: 2015-08-28 | Disposition: A | Discharge status: Home / Self Care | Payer: Commercial Managed Care - HMO | Source: Ambulatory Visit | Attending: Orthopedic Surgery | Admitting: Orthopedic Surgery

## 2015-08-28 ENCOUNTER — Other Ambulatory Visit: Payer: Commercial Managed Care - HMO

## 2015-08-28 DIAGNOSIS — M25512 Pain in left shoulder: Secondary | ICD-10-CM

## 2015-08-30 ENCOUNTER — Other Ambulatory Visit: Payer: Commercial Managed Care - HMO

## 2015-10-22 ENCOUNTER — Ambulatory Visit: Payer: Commercial Managed Care - HMO | Admitting: Neurology

## 2015-10-30 ENCOUNTER — Ambulatory Visit: Payer: Commercial Managed Care - HMO | Admitting: Neurology

## 2015-11-04 ENCOUNTER — Encounter: Payer: Self-pay | Admitting: Internal Medicine

## 2015-11-04 ENCOUNTER — Ambulatory Visit (INDEPENDENT_AMBULATORY_CARE_PROVIDER_SITE_OTHER): Payer: Medicare HMO | Admitting: Internal Medicine

## 2015-11-04 VITALS — BP 128/80 | HR 89 | Temp 98.0°F | Resp 20 | Ht 70.0 in | Wt 270.0 lb

## 2015-11-04 DIAGNOSIS — M545 Low back pain: Secondary | ICD-10-CM

## 2015-11-04 DIAGNOSIS — I10 Essential (primary) hypertension: Secondary | ICD-10-CM

## 2015-11-04 DIAGNOSIS — Z79899 Other long term (current) drug therapy: Secondary | ICD-10-CM

## 2015-11-04 DIAGNOSIS — E785 Hyperlipidemia, unspecified: Secondary | ICD-10-CM

## 2015-11-04 DIAGNOSIS — E1142 Type 2 diabetes mellitus with diabetic polyneuropathy: Secondary | ICD-10-CM

## 2015-11-04 DIAGNOSIS — Z89522 Acquired absence of left knee: Secondary | ICD-10-CM

## 2015-11-04 DIAGNOSIS — R51 Headache: Secondary | ICD-10-CM | POA: Diagnosis not present

## 2015-11-04 DIAGNOSIS — I480 Paroxysmal atrial fibrillation: Secondary | ICD-10-CM | POA: Diagnosis not present

## 2015-11-04 DIAGNOSIS — R519 Headache, unspecified: Secondary | ICD-10-CM

## 2015-11-04 DIAGNOSIS — G8929 Other chronic pain: Secondary | ICD-10-CM

## 2015-11-04 DIAGNOSIS — G4700 Insomnia, unspecified: Secondary | ICD-10-CM | POA: Diagnosis not present

## 2015-11-04 DIAGNOSIS — Z89512 Acquired absence of left leg below knee: Secondary | ICD-10-CM

## 2015-11-04 NOTE — Progress Notes (Signed)
Patient ID: Darrell Horne, male   DOB: 1946/07/10, 70 y.o.   MRN: [id number]    Location:    [facility]   Place of Service:   OFFICE   Advanced Directive information Does patient have an advance directive?: Yes, Type of Advance Directive: Living will, Does patient want to make changes to advanced directive?: No - Patient declined  Chief Complaint  Patient presents with  . Establish Care    HPI:  70 yo male seen today as a new pt. He has no concerns. He is a poor historian due to memory loss. Hx obtained from significant other   DM2 - controlled on metformin. Has neuropathy and takes gabapentin and elavil. 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 He wears a prosthesis. Uses a cane most times but does have a walker when anticipating prolonged standing/walking. Followed by Endo Dr Loanne Drilling. CBGs in 140s. No low BS reactions.   HTN - BP controlled on metoprolol  Hyperlipidemia - stable on lipitor  PAF - rate controlled on metoprolol. Takes eliquis for anticoagulation. Followed by cardio Dr Oval Linsey. Underwent lexiscan myoview in 06/2015 which was nml. 2D echo showed moderate diastolic dysfunction with nml EF. Previous cardio Dr at [facility] - stable on topamax. Chronic intractable. Followed by neurology Dr patel. He was supposed to stop elavil but resumed it as his neuropathy worsened.   Insomnia - stable on elavil  Chronic LBP - pain stable with prn Norco. Followed by Ortho Dr Sharol Given. Of note, he underwent left rotator cuff repair in Dec 2016.    Past Medical History  Diagnosis Date  . PAF (paroxysmal atrial fibrillation) ([employer])   . DM2 (diabetes mellitus, type 2) ([place])   . HTN (hypertension)   . HLD (hyperlipidemia)   . Chest pain   . Palpitations   . Diabetes mellitus   . GERD (gastroesophageal reflux disease)     Past Surgical History  Procedure Laterality Date  . Total ankle arthroplasty  [id number]  . Cholecystectomy    . Shoulder surgery  [id number]    Dr.  Meridee Score  . Hernia repair    . Appendectomy    . Leg amputation Left [id number]    Dr. Kendell Bane  . Colonoscopy      Dr. Michail Sermon    Patient Care Team: Wenda Low, MD as PCP - General (Internal Medicine)  Social History   Social History  . Marital Status: Divorced    Spouse Name: N/A  . Number of Children: 4  . Years of Education: N/A   Occupational History  . retired Administrator    Social History Main Topics  . Smoking status: Never Smoker   . Smokeless tobacco: Never Used  . Alcohol Use: Yes     Comment: Previously drinking a fifth of liquor every two days x 13 years  . Drug Use: No  . Sexual Activity: Not on file   Other Topics Concern  . Not on file   Social History Narrative   Diet:   Do you drink/eat things with caffeine?  Yes   Marital status:  Divorced.  What year were you married?  2001   Do you live in a house, assisted living, condo, apartment, trailer, etc.?  Apartment   Is it one or more stories?  1 story   How many persons live in your home?  1   Do you have any pets in your home?  No   Current or past  profession:  Truck driver   Do you exercise?  Some  Type and how often:  Walking     reports that he has never smoked. He has never used smokeless tobacco. He reports that he drinks alcohol. He reports that he does not use illicit drugs.  Family History  Problem Relation Age of Onset  . Heart disease Mother     Living  . Stroke Father 32    Deceased  . Diabetes Sister    Family Status  Relation Status Death Age  . Mother Alive   . Father Deceased   . Sister Alive   . Brother Alive   . Daughter Alive   . Daughter Alive   . Daughter Alive   . Son Alive   . Sister Alive   . Sister Alive   . Sister Alive     Immunization History  Administered Date(s) Administered  . DTaP 10/10/2014  . Influenza-Unspecified 10/10/2014  . Pneumococcal-Unspecified 10/10/2014    No Known Allergies  Medications: Patient's Medications  New  Prescriptions   No medications on file  Previous Medications   AMITRIPTYLINE (ELAVIL) 25 MG TABLET    Take 25 mg by mouth daily.   APIXABAN (ELIQUIS) 5 MG TABS TABLET    Take 5 mg by mouth 2 (two) times daily.   ATORVASTATIN (LIPITOR) 40 MG TABLET    Take 40 mg by mouth daily.    B COMPLEX VITAMINS TABLET    Take 1 tablet by mouth daily.   CHOLECALCIFEROL (VITAMIN D-3) 5000 UNITS TABS    Take 5,000 Units by mouth once a week.    GABAPENTIN (NEURONTIN) 300 MG CAPSULE    Take 300 mg by mouth 4 (four) times daily.   METFORMIN (GLUCOPHAGE-XR) 500 MG 24 HR TABLET    Take 500 mg by mouth daily with supper.    METOPROLOL SUCCINATE (TOPROL-XL) 50 MG 24 HR TABLET    Take 50 mg by mouth daily. Take with or immediately following a meal.   TOPIRAMATE (TOPAMAX) 25 MG TABLET    Take 1 tablet (25 mg total) by mouth daily.   VITAMIN C (ASCORBIC ACID) 500 MG TABLET    Take 500 mg by mouth daily.  Modified Medications   No medications on file  Discontinued Medications   No medications on file    Review of Systems  Unable to perform ROS: Other  Constitutional: Positive for unexpected weight change (weight gain).  HENT: Positive for dental problem (wears dentures).   Eyes: Positive for visual disturbance (wears corrective lenses).  Cardiovascular: Positive for palpitations.  Skin:       Dry   Neurological: Positive for headaches.  All other systems reviewed and are negative. obtained from new pt packet  Filed Vitals:   11/04/15 0831  BP: 128/80  Pulse: 89  Temp: 98 F (36.7 C)  TempSrc: Oral  Resp: 20  Height: 5\' 10"  (1.778 m)  Weight: 270 lb (122.471 kg)  SpO2: 92%   Body mass index is 38.74 kg/(m^2).  Physical Exam  Constitutional: He appears well-developed and well-nourished.  HENT:  Mouth/Throat: Oropharynx is clear and moist.  Eyes: Pupils are equal, round, and reactive to light. No scleral icterus.  Neck: Neck supple. Carotid bruit is not present.  Cardiovascular: Normal rate  and intact distal pulses.  An irregularly irregular rhythm present. Exam reveals no gallop and no friction rub.   Murmur heard.  Systolic murmur is present with a grade of 1/6  Pulses:  Left dorsalis pedis pulse not accessible.       Left posterior tibial pulse not accessible.  Left BKA. No RLE edema or calf TTP  Pulmonary/Chest: Effort normal and breath sounds normal. He has no wheezes. He has no rales. He exhibits no tenderness.  Abdominal: Soft. Bowel sounds are normal. He exhibits no distension, no abdominal bruit, no pulsatile midline mass and no mass. There is no tenderness. There is no rebound and no guarding.  Musculoskeletal: He exhibits edema and tenderness.  Left BKA  Lymphadenopathy:    He has no cervical adenopathy.  Neurological: He is alert.  Skin: Skin is warm and dry. No rash noted.  Psychiatric: He has a normal mood and affect. His behavior is normal.   Diabetic Foot Exam - Simple   Simple Foot Form  Diabetic Foot exam was performed with the following findings:  Yes 11/04/2015  9:33 AM  Visual Inspection  See comments:  Yes  Sensation Testing  See comments:  Yes  Pulse Check  Posterior Tibialis and Dorsalis pulse intact bilaterally:  Yes  Comments  Left BKA. Monofilament testing reduced in right foot with calluses and dry skin present. No ulcerations. Hammertoes present        Labs reviewed: No visits with results within 3 Month(s) from this visit. Latest known visit with results is:  Office Visit on 07/23/2015  Component Date Value Ref Range Status  . Hemoglobin A1C 07/23/2015 6.0   Final    No results found.   Assessment/Plan   ICD-9-CM ICD-10-CM   1. Type 2 diabetes mellitus with diabetic polyneuropathy, without long-term current use of insulin (HCC) 250.60 E11.42 CMP   357.2  CBC with Differential     Microalbumin/Creatinine Ratio, Urine     Ambulatory referral to Podiatry  2. Essential hypertension 401.9 I10 CBC with Differential  3. HLD  (hyperlipidemia) 272.4 E78.5 Lipid Panel  4. PAF (paroxysmal atrial fibrillation) (HCC) 427.31 I48.0   5. Chronic intractable headache, unspecified headache type 784.0 R51   6. Chronic low back pain 724.2 M54.5    338.29 G89.29   7. Insomnia 780.52 G47.00   8. High risk medication use V58.69 Z79.899 CBC with Differential  9. Hx of BKA, left V49.75 [id number] Ambulatory referral to Podiatry   Continue current medications as ordered  Follow up with specialists as scheduled  Needs eye exam  Needs diabetic foot care - will call with referral  Will call with lab results  Follow up in about 1 month for CPE/MMSE  [facility] S. Perlie Gold  [facility] [address] [place], [place] [address] [phone] Cell (Monday-Friday 8 AM - 5 PM) [phone] After 5 PM and follow prompts

## 2015-11-04 NOTE — Patient Instructions (Signed)
Continue current medications as ordered  Follow up with specialists as scheduled  Needs eye exam  Needs diabetic foot care - will call with referral  Will call with lab results  Follow up in about 1 month for CPE/MMSE

## 2015-11-05 LAB — COMPREHENSIVE METABOLIC PANEL
ALT: 16 IU/L (ref 0–44)
AST: 16 IU/L (ref 0–40)
Albumin/Globulin Ratio: 1.2 (ref 1.1–2.5)
Albumin: 3.6 g/dL (ref 3.5–4.8)
Alkaline Phosphatase: 163 IU/L — ABNORMAL HIGH (ref 39–117)
BUN/Creatinine Ratio: 14 (ref 10–22)
BUN: 11 mg/dL (ref 8–27)
Bilirubin Total: 0.8 mg/dL (ref 0.0–1.2)
CO2: 24 mmol/L (ref 18–29)
Calcium: 9 mg/dL (ref 8.6–10.2)
Chloride: 101 mmol/L (ref 96–106)
Creatinine, Ser: 0.77 mg/dL (ref 0.76–1.27)
GFR calc Af Amer: 106 mL/min/{1.73_m2} (ref >59)
GFR calc non Af Amer: 92 mL/min/{1.73_m2} (ref >59)
Globulin, Total: 3.1 g/dL (ref 1.5–4.5)
Glucose: 120 mg/dL — ABNORMAL HIGH (ref 65–99)
Potassium: 4.9 mmol/L (ref 3.5–5.2)
Sodium: 140 mmol/L (ref 134–144)
Total Protein: 6.7 g/dL (ref 6.0–8.5)

## 2015-11-05 LAB — CBC WITH DIFFERENTIAL/PLATELET
Basophils Absolute: 0 10*3/uL (ref 0.0–0.2)
Basos: 0 %
EOS (ABSOLUTE): 0.2 10*3/uL (ref 0.0–0.4)
Eos: 2 %
Hematocrit: 38.4 % (ref 37.5–51.0)
Hemoglobin: 13.1 g/dL (ref 12.6–17.7)
Immature Grans (Abs): 0 10*3/uL (ref 0.0–0.1)
Immature Granulocytes: 0 %
Lymphocytes Absolute: 2 10*3/uL (ref 0.7–3.1)
Lymphs: 22 %
MCH: 29.8 pg (ref 26.6–33.0)
MCHC: 34.1 g/dL (ref 31.5–35.7)
MCV: 87 fL (ref 79–97)
Monocytes Absolute: 0.6 10*3/uL (ref 0.1–0.9)
Monocytes: 7 %
Neutrophils Absolute: 6.3 10*3/uL (ref 1.4–7.0)
Neutrophils: 69 %
Platelets: 110 10*3/uL — ABNORMAL LOW (ref 150–379)
RBC: 4.4 x10E6/uL (ref 4.14–5.80)
RDW: 14.8 % (ref 12.3–15.4)
WBC: 9 10*3/uL (ref 3.4–10.8)

## 2015-11-05 LAB — LIPID PANEL
Chol/HDL Ratio: 2.1 ratio units (ref 0.0–5.0)
Cholesterol, Total: 144 mg/dL (ref 100–199)
HDL: 70 mg/dL (ref >39)
LDL Calculated: 62 mg/dL (ref 0–99)
Triglycerides: 60 mg/dL (ref 0–149)
VLDL Cholesterol Cal: 12 mg/dL (ref 5–40)

## 2015-11-06 ENCOUNTER — Other Ambulatory Visit: Payer: Medicare HMO

## 2015-11-06 DIAGNOSIS — E1142 Type 2 diabetes mellitus with diabetic polyneuropathy: Secondary | ICD-10-CM | POA: Diagnosis not present

## 2015-11-06 NOTE — Addendum Note (Signed)
Addended by: Darliss Ridgel D on: 11/06/2015 08:17 AM   Modules accepted: Orders

## 2015-11-07 LAB — MICROALBUMIN / CREATININE URINE RATIO
Creatinine, Urine: 51.3 mg/dL
MICROALB/CREAT RATIO: <5.8 mg/g creat (ref 0.0–30.0)
Microalbumin, Urine: <3 ug/mL

## 2015-11-13 ENCOUNTER — Encounter: Payer: Self-pay | Admitting: Internal Medicine

## 2015-11-30 ENCOUNTER — Ambulatory Visit (INDEPENDENT_AMBULATORY_CARE_PROVIDER_SITE_OTHER): Payer: Medicare HMO | Admitting: Sports Medicine

## 2015-11-30 ENCOUNTER — Encounter: Payer: Self-pay | Admitting: Sports Medicine

## 2015-11-30 VITALS — BP 140/82 | HR 82 | Resp 16

## 2015-11-30 DIAGNOSIS — B351 Tinea unguium: Secondary | ICD-10-CM

## 2015-11-30 DIAGNOSIS — E1140 Type 2 diabetes mellitus with diabetic neuropathy, unspecified: Secondary | ICD-10-CM | POA: Diagnosis not present

## 2015-11-30 DIAGNOSIS — Z89432 Acquired absence of left foot: Secondary | ICD-10-CM

## 2015-11-30 DIAGNOSIS — IMO0002 Reserved for concepts with insufficient information to code with codable children: Secondary | ICD-10-CM

## 2015-11-30 DIAGNOSIS — M79671 Pain in right foot: Secondary | ICD-10-CM

## 2015-11-30 NOTE — Progress Notes (Addendum)
Patient ID: Darrell Horne, male   DOB: May 28, 1946, 70 y.o.   MRN: [id number] Subjective: Darrell Horne is a 70 y.o. male patient with history of type2 diabetes who presents to office today complaining of long, painful nails  while ambulating in shoes; unable to trim. Patient states that the glucose reading this morning was "high". Patient denies any new changes in medication or new problems. Patient denies any new cramping, numbness, burning or tingling in the leg.  Patient Active Problem List   Diagnosis Date Noted  . Headache 11/04/2015  . Chronic low back pain 11/04/2015  . Insomnia 11/04/2015  . Neuropathy due to medical condition ([place]) 04/08/2015  . Diabetes ([place]) 04/09/2014  . PAF (paroxysmal atrial fibrillation) ([place])   . DM2 (diabetes mellitus, type 2) ([place])   . HTN (hypertension)   . Palpitations   . HLD (hyperlipidemia)    Current Outpatient Prescriptions on File Prior to Visit  Medication Sig Dispense Refill  . amitriptyline (ELAVIL) 25 MG tablet Take 25 mg by mouth daily.    Marland Kitchen apixaban (ELIQUIS) 5 MG TABS tablet Take 5 mg by mouth 2 (two) times daily.    Marland Kitchen atorvastatin (LIPITOR) 40 MG tablet Take 40 mg by mouth daily.     Marland Kitchen b complex vitamins tablet Take 1 tablet by mouth daily.    . Cholecalciferol (VITAMIN D-3) 5000 UNITS TABS Take 5,000 Units by mouth once a week.     . gabapentin (NEURONTIN) 300 MG capsule Take 300 mg by mouth 4 (four) times daily.    . metFORMIN (GLUCOPHAGE-XR) 500 MG 24 hr tablet Take 500 mg by mouth daily with supper.     . metoprolol succinate (TOPROL-XL) 50 MG 24 hr tablet Take 50 mg by mouth daily. Take with or immediately following a meal.    . topiramate (TOPAMAX) 25 MG tablet Take 1 tablet (25 mg total) by mouth daily. 90 tablet 3  . vitamin C (ASCORBIC ACID) 500 MG tablet Take 500 mg by mouth daily.     No current facility-administered medications on file prior to visit.   No Known Allergies   Objective: General: Patient is awake, alert,  and oriented x 3 and in no acute distress.  Integument: Skin is warm, dry and supple. Dry blood blister to right 2nd toe dorsal aspect with no opening or signs of infection. Nails are tender, long, thickened and dystrophic with subungual debris, consistent with onychomycosis, 1-5 on right. No signs of infection. No open lesions or preulcerative lesions present. Remaining integument unremarkable.  Vasculature:  Dorsalis Pedis pulse 1/4. Posterior Tibial pulse  1/4 on right Capillary fill time <3 sec 1-5 on right. No hair growth to the level of the digits. Temperature gradient within normal limits. No varicosities present. No edema present.   Neurology: The patient has absent sensation measured with a 5.07/10g Semmes Weinstein Monofilament at all pedal sites on right. Vibratory sensation absent on right with tuning fork. No Babinski sign present.   Musculoskeletal: Left foot amputation 2013. Mild hammertoes on right. Muscular strength 5/5 in all lower extremity muscular groups on right without pain on range of motion. No tenderness with calf compression right.  Assessment and Plan: Problem List Items Addressed This Visit    None    Visit Diagnoses    Dermatophytosis of nail    -  Primary    Type 2 diabetes, controlled, with neuropathy ([place])        Foot amputation status, left ([place])  Foot pain, right          -Examined patient. -Discussed and educated patient on diabetic foot care, especially with  regards to the vascular, neurological and musculoskeletal systems.  -Stressed the importance of good glycemic control and the detriment of not  controlling glucose levels in relation to the foot. -Mechanically debrided all nails 1-5 on right using sterile nail nipper and filed with dremel without incident  -Advised patient to talk with PCP or neurologist about increasing or changing Gabapentin  -Answered all patient questions -Patient to return in 3 months for at risk foot care -Patient  advised to call the office if any problems or questions arise in the meantime.  Darrell Horne, DPM

## 2015-12-03 ENCOUNTER — Other Ambulatory Visit: Payer: Self-pay

## 2015-12-03 MED ORDER — TOPIRAMATE 25 MG PO TABS: 25.0000 mg | ORAL_TABLET | Freq: Every day | ORAL | Status: DC

## 2015-12-03 MED ORDER — METFORMIN HCL ER 500 MG PO TB24: 500.0000 mg | ORAL_TABLET | Freq: Every day | ORAL | Status: DC

## 2015-12-03 MED ORDER — GABAPENTIN 300 MG PO CAPS: 300.0000 mg | ORAL_CAPSULE | Freq: Four times a day (QID) | ORAL | Status: DC

## 2015-12-03 MED ORDER — APIXABAN 5 MG PO TABS: 5.0000 mg | ORAL_TABLET | Freq: Two times a day (BID) | ORAL | Status: DC

## 2015-12-03 MED ORDER — ATORVASTATIN CALCIUM 40 MG PO TABS: 40.0000 mg | ORAL_TABLET | Freq: Every day | ORAL | Status: DC

## 2015-12-03 MED ORDER — METOPROLOL SUCCINATE ER 50 MG PO TB24: 50.0000 mg | ORAL_TABLET | Freq: Every day | ORAL | Status: DC

## 2015-12-03 MED ORDER — AMITRIPTYLINE HCL 25 MG PO TABS: 25.0000 mg | ORAL_TABLET | Freq: Every day | ORAL | Status: DC

## 2015-12-03 NOTE — Telephone Encounter (Signed)
Message left on triage voicemail, need medication refills. please call    I called patient, patient looked at the back of his insurance card and it has [employer] listed on the back.

## 2015-12-04 ENCOUNTER — Other Ambulatory Visit: Payer: Self-pay | Admitting: *Deleted

## 2015-12-04 MED ORDER — AMITRIPTYLINE HCL 25 MG PO TABS: 25.0000 mg | ORAL_TABLET | Freq: Every day | ORAL | Status: DC

## 2015-12-04 MED ORDER — METOPROLOL SUCCINATE ER 50 MG PO TB24: 50.0000 mg | ORAL_TABLET | Freq: Every day | ORAL | Status: DC

## 2015-12-04 MED ORDER — ATORVASTATIN CALCIUM 40 MG PO TABS: 40.0000 mg | ORAL_TABLET | Freq: Every day | ORAL | Status: DC

## 2015-12-04 MED ORDER — TOPIRAMATE 25 MG PO TABS: 25.0000 mg | ORAL_TABLET | Freq: Every day | ORAL | Status: DC

## 2015-12-04 MED ORDER — METFORMIN HCL ER 500 MG PO TB24: 500.0000 mg | ORAL_TABLET | Freq: Every day | ORAL | Status: DC

## 2015-12-04 MED ORDER — GABAPENTIN 300 MG PO CAPS: 300.0000 mg | ORAL_CAPSULE | Freq: Four times a day (QID) | ORAL | Status: DC

## 2015-12-04 MED ORDER — APIXABAN 5 MG PO TABS: 5.0000 mg | ORAL_TABLET | Freq: Two times a day (BID) | ORAL | Status: DC

## 2015-12-04 NOTE — Telephone Encounter (Signed)
[place] [facility]

## 2015-12-07 ENCOUNTER — Other Ambulatory Visit: Payer: Self-pay | Admitting: *Deleted

## 2015-12-07 MED ORDER — GABAPENTIN 300 MG PO CAPS: 300.0000 mg | ORAL_CAPSULE | Freq: Four times a day (QID) | ORAL | Status: DC

## 2015-12-07 MED ORDER — METFORMIN HCL ER 500 MG PO TB24: 500.0000 mg | ORAL_TABLET | Freq: Every day | ORAL | Status: DC

## 2015-12-07 MED ORDER — METOPROLOL SUCCINATE ER 50 MG PO TB24: 50.0000 mg | ORAL_TABLET | Freq: Every day | ORAL | Status: DC

## 2015-12-07 MED ORDER — TOPIRAMATE 25 MG PO TABS: 25.0000 mg | ORAL_TABLET | Freq: Every day | ORAL | Status: DC

## 2015-12-07 MED ORDER — APIXABAN 5 MG PO TABS: 5.0000 mg | ORAL_TABLET | Freq: Two times a day (BID) | ORAL | Status: DC

## 2015-12-07 MED ORDER — ATORVASTATIN CALCIUM 40 MG PO TABS: 40.0000 mg | ORAL_TABLET | Freq: Every day | ORAL | Status: DC

## 2015-12-07 MED ORDER — AMITRIPTYLINE HCL 25 MG PO TABS: 25.0000 mg | ORAL_TABLET | Freq: Every day | ORAL | Status: DC

## 2015-12-07 NOTE — Telephone Encounter (Signed)
Acupuncturist.

## 2015-12-09 ENCOUNTER — Ambulatory Visit (INDEPENDENT_AMBULATORY_CARE_PROVIDER_SITE_OTHER): Payer: Medicare HMO | Admitting: Internal Medicine

## 2015-12-09 ENCOUNTER — Encounter: Payer: Self-pay | Admitting: Internal Medicine

## 2015-12-09 VITALS — BP 118/72 | HR 98 | Temp 97.9°F | Resp 20 | Ht 70.0 in | Wt 264.8 lb

## 2015-12-09 DIAGNOSIS — N400 Enlarged prostate without lower urinary tract symptoms: Secondary | ICD-10-CM | POA: Diagnosis not present

## 2015-12-09 DIAGNOSIS — I480 Paroxysmal atrial fibrillation: Secondary | ICD-10-CM | POA: Diagnosis not present

## 2015-12-09 DIAGNOSIS — E1142 Type 2 diabetes mellitus with diabetic polyneuropathy: Secondary | ICD-10-CM

## 2015-12-09 DIAGNOSIS — R51 Headache: Secondary | ICD-10-CM

## 2015-12-09 DIAGNOSIS — Z89512 Acquired absence of left leg below knee: Secondary | ICD-10-CM

## 2015-12-09 DIAGNOSIS — G8929 Other chronic pain: Secondary | ICD-10-CM | POA: Diagnosis not present

## 2015-12-09 DIAGNOSIS — E785 Hyperlipidemia, unspecified: Secondary | ICD-10-CM | POA: Diagnosis not present

## 2015-12-09 DIAGNOSIS — G4700 Insomnia, unspecified: Secondary | ICD-10-CM

## 2015-12-09 DIAGNOSIS — R519 Headache, unspecified: Secondary | ICD-10-CM

## 2015-12-09 DIAGNOSIS — Z0000 Encounter for general adult medical examination without abnormal findings: Secondary | ICD-10-CM

## 2015-12-09 DIAGNOSIS — Z89522 Acquired absence of left knee: Secondary | ICD-10-CM

## 2015-12-09 DIAGNOSIS — I10 Essential (primary) hypertension: Secondary | ICD-10-CM

## 2015-12-09 DIAGNOSIS — Z125 Encounter for screening for malignant neoplasm of prostate: Secondary | ICD-10-CM | POA: Diagnosis not present

## 2015-12-09 DIAGNOSIS — M545 Low back pain: Secondary | ICD-10-CM

## 2015-12-09 MED ORDER — GABAPENTIN 600 MG PO TABS: 600.0000 mg | ORAL_TABLET | Freq: Three times a day (TID) | ORAL | Status: DC

## 2015-12-09 NOTE — Progress Notes (Signed)
Patient ID: CORDAI LITTERER, male   DOB: [birth year], 70 y.o.   MRN: [id number] Subjective:     Darrell Horne is a 70 y.o. male and is here for a comprehensive physical exam. The patient reports no problems.  DM2 - controlled on metformin. Has neuropathy that is uncontrolled and takes gabapentin 1200mg  per day and elavil. 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 He wears a prosthesis. Uses a cane most times but does have a walker when anticipating prolonged standing/walking. Followed by Endo Dr Loanne Drilling. CBGs in 140s. No low BS reactions. He saw podiatry in Feb 2017 and nails debrided. A1c 6% in Oct 2016. Last eye exam in June 2016.  HTN - BP controlled on metoprolol  Hyperlipidemia - stable on lipitor  PAF - rate controlled on metoprolol. Takes eliquis for anticoagulation. Followed by cardio Dr Oval Linsey. Underwent lexiscan myoview in 06/2015 which was nml. 2D echo showed moderate diastolic dysfunction with nml EF. Last ECg in 06/2015 showed 1st degree AVB and LBBB. Previous cardio Dr at [facility] - stable on topamax. Chronic intractable. Followed by neurology Dr patel. He was supposed to stop elavil but resumed it as his neuropathy worsened.   Insomnia - stable on elavil  Chronic LBP - pain stable with prn Norco. Followed by Ortho Dr Sharol Given. Of note, he underwent left rotator cuff repair in Dec 2016.   Past Medical History  Diagnosis Date  . PAF (paroxysmal atrial fibrillation) ([place])   . DM2 (diabetes mellitus, type 2) ([place])   . HTN (hypertension)   . HLD (hyperlipidemia)   . Chest pain   . Palpitations   . Diabetes mellitus   . GERD (gastroesophageal reflux disease)    Past Surgical History  Procedure Laterality Date  . Total ankle arthroplasty  [id number]  . Cholecystectomy    . Shoulder surgery  [id number]    Dr. Meridee Score  . Hernia repair    . Appendectomy    . Leg amputation Left [id number]    Dr. Kendell Bane  . Colonoscopy      Dr. Michail Sermon   Family History   Problem Relation Age of Onset  . Heart disease Mother     Living  . Stroke Father 49    Deceased  . Diabetes Sister     Social History   Social History  . Marital Status: Divorced    Spouse Name: N/A  . Number of Children: 4  . Years of Education: N/A   Occupational History  . retired Administrator    Social History Main Topics  . Smoking status: Never Smoker   . Smokeless tobacco: Never Used  . Alcohol Use: Yes     Comment: Previously drinking a fifth of liquor every two days x 13 years  . Drug Use: No  . Sexual Activity: Not on file   Other Topics Concern  . Not on file   Social History Narrative   Diet:   Do you drink/eat things with caffeine?  Yes   Marital status:  Divorced.  What year were you married?  2001   Do you live in a house, assisted living, condo, apartment, trailer, etc.?  Apartment   Is it one or more stories?  1 story   How many persons live in your home?  1   Do you have any pets in your home?  No   Current or past profession:  Truck driver   Do you exercise?  Some  Type and how often:  Walking   Health Maintenance  Topic Date Due  . Hepatitis C Screening  06/17/1946  . TETANUS/TDAP  09/13/1964  . COLONOSCOPY  09/14/1995  . ZOSTAVAX  09/13/2005  . INFLUENZA VACCINE  05/11/2015  . PNA vac Low Risk Adult (2 of 2 - PCV13) 10/11/2015  . HEMOGLOBIN A1C  01/21/2016  . OPHTHALMOLOGY EXAM  03/23/2016  . FOOT EXAM  11/03/2016  . URINE MICROALBUMIN  11/05/2016   Current Outpatient Prescriptions on File Prior to Visit  Medication Sig Dispense Refill  . amitriptyline (ELAVIL) 25 MG tablet Take 1 tablet (25 mg total) by mouth daily. 90 tablet 1  . apixaban (ELIQUIS) 5 MG TABS tablet Take 1 tablet (5 mg total) by mouth 2 (two) times daily. 180 tablet 1  . atorvastatin (LIPITOR) 40 MG tablet Take 1 tablet (40 mg total) by mouth daily. 90 tablet 1  . b complex vitamins tablet Take 1 tablet by mouth daily.    . Cholecalciferol (VITAMIN D-3) 5000 UNITS  TABS Take 5,000 Units by mouth once a week.     . gabapentin (NEURONTIN) 300 MG capsule Take 1 capsule (300 mg total) by mouth 4 (four) times daily. 360 capsule 1  . metFORMIN (GLUCOPHAGE-XR) 500 MG 24 hr tablet Take 1 tablet (500 mg total) by mouth daily with supper. 90 tablet 1  . metoprolol succinate (TOPROL-XL) 50 MG 24 hr tablet Take 1 tablet (50 mg total) by mouth daily. Take with or immediately following a meal. 90 tablet 1  . topiramate (TOPAMAX) 25 MG tablet Take 1 tablet (25 mg total) by mouth daily. 90 tablet 1  . vitamin C (ASCORBIC ACID) 500 MG tablet Take 500 mg by mouth daily.     No current facility-administered medications on file prior to visit.     Review of Systems   Review of Systems  Musculoskeletal: Positive for back pain and joint pain. Negative for falls.  All other systems reviewed and are negative.    Objective:   Filed Vitals:   12/09/15 0952  BP: 118/72  Pulse: 98  Temp: 97.9 F (36.6 C)  TempSrc: Oral  Resp: 20  Height: 5\' 10"  (1.778 m)  Weight: 264 lb 12.8 oz (120.112 kg)  SpO2: 97%       Physical Exam  Constitutional: He is oriented to person, place, and time and well-developed, well-nourished, and in no distress.  HENT:  Head: Normocephalic and atraumatic.  Right Ear: Hearing, tympanic membrane, external ear and ear canal normal.  Left Ear: Hearing, tympanic membrane, external ear and ear canal normal.  Mouth/Throat: Uvula is midline, oropharynx is clear and moist and mucous membranes are normal.  Eyes: Conjunctivae, EOM and lids are normal. Right eye exhibits no discharge. No scleral icterus.  Neck: Trachea normal. Neck supple. Carotid bruit is not present. No tracheal deviation present. No thyroid mass and no thyromegaly present.  Cardiovascular: Normal rate, regular rhythm and intact distal pulses.  Exam reveals no gallop and no friction rub.   Murmur (1/6 SEM) heard. Pulmonary/Chest: Effort normal and breath sounds normal. No stridor.  No respiratory distress. He has no wheezes. He has no rhonchi. He has no rales. He exhibits no mass, no tenderness and no crepitus. Right breast exhibits no inverted nipple, no mass, no nipple discharge, no skin change and no tenderness. Left breast exhibits no inverted nipple, no mass, no nipple discharge, no skin change and no tenderness. Breasts are symmetrical.  Abdominal: Soft. Normal appearance, normal  aorta and bowel sounds are normal. He exhibits no abdominal bruit, no ascites, no pulsatile midline mass and no mass. There is no hepatosplenomegaly. There is no tenderness. There is no rebound. No hernia.  Genitourinary: Rectum normal. Rectal exam shows no external hemorrhoid, no internal hemorrhoid, no laceration, no mass and no tenderness. Prostate is enlarged (smooth, firm and no palpable mass).  Musculoskeletal: He exhibits edema and tenderness.  Left BKA with prosthesis intact  Lymphadenopathy:       Head (right side): No submandibular and no posterior auricular adenopathy present.       Head (left side): No submandibular and no posterior auricular adenopathy present.    He has no cervical adenopathy.       Right: No supraclavicular adenopathy present.       Left: No supraclavicular adenopathy present.  Neurological: He is alert and oriented to person, place, and time. He has normal motor skills, normal strength and normal reflexes.  Skin: Skin is warm, dry and intact. No rash noted.  Psychiatric: Mood, memory, affect and judgment normal.   Diabetic Foot Exam - Simple   Simple Foot Form  Diabetic Foot exam was performed with the following findings:  Yes 12/09/2015 10:43 AM  Visual Inspection  See comments:  Yes  Sensation Testing  See comments:  Yes  Pulse Check  See comments:  Yes  Comments  Left BKA. Monofilament testing reduced on right. Right anterior 2nd toe hematoma present. nt no d/c. prurplish discoloration present to right 1st-3rd toes but excellent capillary refill. DP/PT  pulse intact on right. No calf TTP     MMSE - Mini Mental State Exam 12/09/2015  Not completed: (No Data)  Orientation to time 5  Orientation to Place 5  Registration 3  Attention/ Calculation 5  Recall 2  Language- name 2 objects 2  Language- repeat 1  Language- follow 3 step command 3  Language- read & follow direction 1  Write a sentence 1  Copy design 1  Total score 29      Recent Results (from the past 2160 hour(s))  CMP     Status: Abnormal   Collection Time: 11/04/15  9:31 AM  Result Value Ref Range   Glucose 120 (H) 65 - 99 mg/dL   BUN 11 8 - 27 mg/dL   Creatinine, Ser 0.77 0.76 - 1.27 mg/dL   GFR calc non Af Amer 92 >59 mL/min/1.73   GFR calc Af Amer 106 >59 mL/min/1.73   BUN/Creatinine Ratio 14 10 - 22   Sodium 140 134 - 144 mmol/L   Potassium 4.9 3.5 - 5.2 mmol/L   Chloride 101 96 - 106 mmol/L   CO2 24 18 - 29 mmol/L   Calcium 9.0 8.6 - 10.2 mg/dL   Total Protein 6.7 6.0 - 8.5 g/dL   Albumin 3.6 3.5 - 4.8 g/dL   Globulin, Total 3.1 1.5 - 4.5 g/dL   Albumin/Globulin Ratio 1.2 1.1 - 2.5   Bilirubin Total 0.8 0.0 - 1.2 mg/dL   Alkaline Phosphatase 163 (H) 39 - 117 IU/L   AST 16 0 - 40 IU/L   ALT 16 0 - 44 IU/L  Lipid Panel     Status: None   Collection Time: 11/04/15  9:31 AM  Result Value Ref Range   Cholesterol, Total 144 100 - 199 mg/dL   Triglycerides 60 0 - 149 mg/dL   HDL 70 >39 mg/dL   VLDL Cholesterol Cal 12 5 - 40 mg/dL   LDL Calculated  62 0 - 99 mg/dL   Chol/HDL Ratio 2.1 0.0 - 5.0 ratio units    Comment:                                   T. Chol/HDL Ratio                                             Men  Women                               1/2 Avg.Risk  3.4    3.3                                   Avg.Risk  5.0    4.4                                2X Avg.Risk  9.6    7.1                                3X Avg.Risk 23.4   11.0   CBC with Differential     Status: Abnormal   Collection Time: 11/04/15  9:31 AM  Result Value Ref Range   WBC 9.0  3.4 - 10.8 x10E3/uL   RBC 4.40 4.14 - 5.80 x10E6/uL   Hemoglobin 13.1 12.6 - 17.7 g/dL   Hematocrit 38.4 37.5 - 51.0 %   MCV 87 79 - 97 fL   MCH 29.8 26.6 - 33.0 pg   MCHC 34.1 31.5 - 35.7 g/dL   RDW 14.8 12.3 - 15.4 %   Platelets 110 (L) 150 - 379 x10E3/uL   Neutrophils 69 %   Lymphs 22 %   Monocytes 7 %   Eos 2 %   Basos 0 %   Neutrophils Absolute 6.3 1.4 - 7.0 x10E3/uL   Lymphocytes Absolute 2.0 0.7 - 3.1 x10E3/uL   Monocytes Absolute 0.6 0.1 - 0.9 x10E3/uL   EOS (ABSOLUTE) 0.2 0.0 - 0.4 x10E3/uL   Basophils Absolute 0.0 0.0 - 0.2 x10E3/uL   Immature Granulocytes 0 %   Immature Grans (Abs) 0.0 0.0 - 0.1 x10E3/uL  Microalbumin/Creatinine Ratio, Urine     Status: None   Collection Time: 11/06/15  8:16 AM  Result Value Ref Range   Creatinine, Urine 51.3 Not Estab. mg/dL   Microalbum.,U,Random <3.0 Not Estab. ug/mL    Comment: **Verified by repeat analysis**   MICROALB/CREAT RATIO <5.8 0.0 - 30.0 mg/g creat    Assessment:    Healthy male exam.       ICD-9-CM ICD-10-CM   1. Well adult exam V70.0 Z00.00   2. Type 2 diabetes mellitus with diabetic polyneuropathy, without long-term current use of insulin ([facility]) 250.60 E11.42 gabapentin (NEURONTIN) 600 MG tablet   357.2    3. PAF (paroxysmal atrial fibrillation) ([facility]) 427.31 I48.0   4. Essential hypertension 401.9 I10   5. HLD (hyperlipidemia) 272.4 E78.5   6. Chronic intractable headache, unspecified headache type 784.0 R51   7. Chronic low back pain 724.2 M54.5    338.29  G89.29   8. Insomnia 780.52 G47.00   9. Hx of BKA, left V49.75 Z89.522   10. Enlarged prostate on rectal examination 600.00 N40.0 PSA  11. Prostate cancer screening V76.44 Z12.5 PSA    Plan:     See After Visit Summary for Counseling Recommendations   Pt is UTD on health maintenance. Vaccinations are UTD. Pt maintains a healthy lifestyle. Encouraged pt to exercise 30-45 minutes 4-5 times per week. Eat a well balanced diet. Avoid smoking. Limit  alcohol intake. Wear seatbelt when riding in the car. Wear sun block (SPF >50) when spending extended times outside.  Follow up with specialists as scheduled  Change gabapentin to 600mg  3 times daily. May take 300mg  dose 2 tabs 3 times daily until new medications comes.  Check PSA today  Continue other medications as ordered  Follow up in 4 mos for routine visit  Annalyse Langlais S. Perlie Gold  [facility] [address] [place], [place] [address] [phone] Cell (Monday-Friday 8 AM - 5 PM) [phone] After 5 PM and follow prompts

## 2015-12-09 NOTE — Patient Instructions (Addendum)
Encouraged him to exercise 30-45 minutes 4-5 times per week. Eat a well balanced diet. Avoid smoking. Limit alcohol intake. Wear seatbelt when riding in the car. Wear sun block (SPF >50) when spending extended times outside.  Follow up with specialists as scheduled  Change gabapentin to 600mg  3 times daily. May take 300mg  dose 2 tabs 3 times daily until new medications comes.  Check PSA today  Continue other medications as ordered  Follow up in 4 mos for routine visit

## 2015-12-10 LAB — PSA: Prostate Specific Ag, Serum: 0.5 ng/mL (ref 0.0–4.0)

## 2015-12-23 ENCOUNTER — Ambulatory Visit (INDEPENDENT_AMBULATORY_CARE_PROVIDER_SITE_OTHER): Payer: Medicare HMO | Admitting: Neurology

## 2015-12-23 ENCOUNTER — Encounter: Payer: Self-pay | Admitting: Neurology

## 2015-12-23 VITALS — BP 100/60 | HR 79 | Ht 70.0 in | Wt 270.4 lb

## 2015-12-23 DIAGNOSIS — R278 Other lack of coordination: Secondary | ICD-10-CM | POA: Diagnosis not present

## 2015-12-23 DIAGNOSIS — G44209 Tension-type headache, unspecified, not intractable: Secondary | ICD-10-CM

## 2015-12-23 DIAGNOSIS — E669 Obesity, unspecified: Secondary | ICD-10-CM

## 2015-12-23 DIAGNOSIS — E1142 Type 2 diabetes mellitus with diabetic polyneuropathy: Secondary | ICD-10-CM | POA: Diagnosis not present

## 2015-12-23 DIAGNOSIS — G621 Alcoholic polyneuropathy: Secondary | ICD-10-CM | POA: Diagnosis not present

## 2015-12-23 DIAGNOSIS — R69 Illness, unspecified: Secondary | ICD-10-CM | POA: Diagnosis not present

## 2015-12-23 MED ORDER — AMITRIPTYLINE HCL 50 MG PO TABS: 50.0000 mg | ORAL_TABLET | Freq: Every day | ORAL | Status: DC

## 2015-12-23 NOTE — Patient Instructions (Signed)
1.  Continue your medications as you are taking them 2.  If you choose to have physical therapy for balance training, please contact my office so we can arrange this 3.  Return to clinic in 12-months

## 2015-12-23 NOTE — Progress Notes (Signed)
Follow-up Visit   Date: 12/23/2015    Darrell Horne MRN: [id number] DOB: 10-Dec-1945   Interim History: Darrell Horne is a 70 y.o. right-handed Caucasian male with diabetes mellitus type 2 s/p left BKA (2013), chronic low back pain, hypertension, paroxsymal atrial fibrillation (on Eliquis), and hyperlipidemia returning to the clinic for follow-up of neuropathy and new complaints of headaches.  The patient was accompanied to the clinic by friend who also provides collateral information.    History of present illness: Beginning around late 1990s, he started having numbness and tingling of the feet which has progressed over the years. He underwent left BKA in 2013. Currently, he has numbness of the right foot and sharp pain involving the toes. Around 2013, he began noticing numbness of the finger tips. He is dropping objects because he cannot feel things. He denies any weakness of the hands. He does not have a lot of tingling in the hands or feet. He is taking gabapentin 679m TID and amitriptyline 580mqhs. Nothing improves or exacerbates his pain. He has a 15 year history of alcoholism, but has been sober since 2013.   Three weeks ago, he reports falling off the bed and hit his head on the nightstand. There was no loss consciousness. Since then, he complains of dull right parietal headache which radiates down his neck and back. Pain lasts about 9m66m- 30 minutes and occurs daily. He has tried tylenol but it does not provide relief. No associated vision change, facial weakness, or dizziness.   He also has chronic low back pain and had a MRI lumbar spine ordered by his orthopeadic physican.    UPDATE 07/16/2015:   Headaches have become worsen and variable in location and did not respond to increasing amitriptyline to 79m62mHe is not taking anything over the counter for the pain.  He endorses lacrimation of the left nares, but has worse headaches on the right side.   UPDATE  12/23/2015:  He reports that headaches have resolved since taking topamax 29mg62m amitriptyline 50mg 2medtime.  Last headache was about 3 weeks ago and much milder than previously.  He has also noticed that his paresthesias are improved on topamax.  He and his wife are planning on joining [employer]. He uses a cane and wheelchair to ambulate.  Medications:  Current Outpatient Prescriptions on File Prior to Visit  Medication Sig Dispense Refill  . apixaban (ELIQUIS) 5 MG TABS tablet Take 1 tablet (5 mg total) by mouth 2 (two) times daily. 180 tablet 1  . atorvastatin (LIPITOR) 40 MG tablet Take 1 tablet (40 mg total) by mouth daily. 90 tablet 1  . b complex vitamins tablet Take 1 tablet by mouth daily.    . Cholecalciferol (VITAMIN D-3) 5000 UNITS TABS Take 5,000 Units by mouth once a week.     . gabapentin (NEURONTIN) 600 MG tablet Take 1 tablet (600 mg total) by mouth 3 (three) times daily. 270 tablet 3  . metFORMIN (GLUCOPHAGE-XR) 500 MG 24 hr tablet Take 1 tablet (500 mg total) by mouth daily with supper. 90 tablet 1  . metoprolol succinate (TOPROL-XL) 50 MG 24 hr tablet Take 1 tablet (50 mg total) by mouth daily. Take with or immediately following a meal. 90 tablet 1  . topiramate (TOPAMAX) 25 MG tablet Take 1 tablet (25 mg total) by mouth daily. 90 tablet 1  . vitamin C (ASCORBIC ACID) 500 MG  tablet Take 500 mg by mouth daily.     No current facility-administered medications on file prior to visit.    Allergies: No Known Allergies  Review of Systems:  CONSTITUTIONAL: No fevers, chills, night sweats, or weight loss.  EYES: No visual changes or eye pain ENT: No hearing changes.  No history of nose bleeds.   RESPIRATORY: No cough, wheezing and shortness of breath.   CARDIOVASCULAR: Negative for chest pain, and palpitations.   GI: Negative for abdominal discomfort, blood in stools or black stools.  No recent change in bowel habits.   GU:  No  history of incontinence.   MUSCLOSKELETAL: No history of joint pain or swelling.  No myalgias.   SKIN: Negative for lesions, rash, and itching.   ENDOCRINE: Negative for cold or heat intolerance, polydipsia or goiter.   PSYCH:  No depression or anxiety symptoms.   NEURO: As Above.   Vital Signs:  BP 100/60 mmHg  Pulse 79  Ht _0  (1.778 m)  Wt 270 lb 6 oz (122.641 kg)  BMI 38.79 kg/m2  SpO2 97%  Neurological Exam: MENTAL STATUS including orientation to time, place, person, recent and remote memory, attention span and concentration, language, and fund of knowledge is normal.  Speech is not dysarthric.  CRANIAL NERVES: Pupils equal round and reactive to light.  Normal conjugate, extra-ocular eye movements in all directions of gaze.  No ptosis. Normal facial sensation.  Face is symmetric. Palate elevates symmetrically.  Tongue is midline.  MOTOR:  Left BKA.  Motor strength is 5/5 upper extremities including hip flexors, except 4+/5 intrinsic hand muscles.    MSRs:  Reflexes are 1+/4 in upper extremities and absent in the leg.  SENSORY:  Reduced sensation in a glove-stocking distribution.  COORDINATION/GAIT:  Finger to nose testing intact.  It takes him many attempts to stand up even with using his arms. Wide-based, moderately ataxic gait.  Data: Lab Results  Component Value Date   HGBA1C 6.0 07/23/2015   Labs 07/16/2015:  Vitamin B12 618, ESR 21, CRP 1.2, vitamin B1, copper 116  MRI/A brain 08/05/2015: 1. No intracranial explanation for headache.  No change since 2011. 2. Multi focal intracranial atherosclerosis as described above. The most proximal high-grade stenosis is at the left A1 segment. 3. No visualized carotid or vertebral artery stenosis in the neck.  IMPRESSION/PLAN: 1. Peripheral neuropathy due to diabetes and alcoholism (sober since 2013) affecting glove-stocking distribution - Risk factors: Diabetes and significant alcohol history -  Symptoms manifesting predominately with numbness and explained that medications are most helpful with painful paresthesias - Encouraged him to maintain tight glycemic control - Unable to increase gabapentin due to him feeling sleepy on 676m TID  2. Tension headaches - improved with amitriptyline and topirmate  3.  Multifactorial gait abnormality due to neuropathy, low back pain, and left BKA  PLAN/RECOMMENDATIONS:  1.  Continue topiramate 272mdaily 2.  Continue amitriptyline 5054mhs  3.  Fall precautions discussed 4.  PT for gait training declined.  [facility]  Weight loss strategies discussed  Return to clinic in 6 months   The duration of this appointment visit was 25 minutes of face-to-face time with the patient.  Greater than 50% of this time was spent in counseling, explanation of diagnosis, planning of further management, and coordination of care.   Thank you for allowing me to participate in patient's care.  If I can answer any additional questions, I would be  pleased to do so.    Sincerely,    Larico Dimock K. Posey Pronto, DO

## 2016-01-25 ENCOUNTER — Encounter: Payer: Self-pay | Admitting: Endocrinology

## 2016-01-25 ENCOUNTER — Ambulatory Visit (INDEPENDENT_AMBULATORY_CARE_PROVIDER_SITE_OTHER): Payer: Medicare HMO | Admitting: Endocrinology

## 2016-01-25 VITALS — BP 122/62 | HR 66 | Temp 98.6°F

## 2016-01-25 DIAGNOSIS — E1142 Type 2 diabetes mellitus with diabetic polyneuropathy: Secondary | ICD-10-CM | POA: Diagnosis not present

## 2016-01-25 LAB — POCT GLYCOSYLATED HEMOGLOBIN (HGB A1C): Hemoglobin A1C: 6.2

## 2016-01-25 NOTE — Patient Instructions (Signed)
check your blood sugar once a day.  vary the time of day when you check, between before the 3 meals, and at bedtime.  also check if you have symptoms of your blood sugar being too high or too low.  please keep a record of the readings and bring it to your next appointment here.  You can write it on any piece of paper.  please call us sooner if your blood sugar goes below 70, or if you have a lot of readings over 200.   Please continue the same metformin.   For the ulcer on your left leg, keep it covered with antibiotic ointment and a bandaid.  Call if you want to go back to Dr Sharol Given.   Please come back for a follow-up appointment in 6 months.

## 2016-01-25 NOTE — Progress Notes (Signed)
Subjective:    Patient ID: Darrell Horne, male    DOB: 1946-09-24, 70 y.o.   MRN: [id number]  HPI Pt returns for f/u of diabetes mellitus: DM type: 2 Dx'ed: [id number] Complications: severe painful neuropathy of all 4 extremities, and left BKA.   Therapy: metformin. DKA: never Severe hypoglycemia: never.  Pancreatitis: never Other: he has never been on insulin; he declines Januvia.   Interval history: no cbg record, but states cbg's are in the low-100's.  pt states he feels well in general.   Past Medical History  Diagnosis Date  . PAF (paroxysmal atrial fibrillation) ([place])   . DM2 (diabetes mellitus, type 2) ([place])   . HTN (hypertension)   . HLD (hyperlipidemia)   . Chest pain   . Palpitations   . Diabetes mellitus   . GERD (gastroesophageal reflux disease)     Past Surgical History  Procedure Laterality Date  . Total ankle arthroplasty  [id number]  . Cholecystectomy    . Shoulder surgery  [id number]    Dr. Meridee Score  . Hernia repair    . Appendectomy    . Leg amputation Left [id number]    Dr. Kendell Bane  . Colonoscopy      Dr. Michail Sermon    Social History   Social History  . Marital Status: Divorced    Spouse Name: N/A  . Number of Children: 4  . Years of Education: N/A   Occupational History  . retired Administrator    Social History Main Topics  . Smoking status: Never Smoker   . Smokeless tobacco: Never Used  . Alcohol Use: Yes     Comment: Previously drinking a fifth of liquor every two days x 13 years  . Drug Use: No  . Sexual Activity: Not on file   Other Topics Concern  . Not on file   Social History Narrative   Diet:   Do you drink/eat things with caffeine?  Yes   Marital status:  Divorced.  What year were you married?  2001   Do you live in a house, assisted living, condo, apartment, trailer, etc.?  Apartment   Is it one or more stories?  1 story   How many persons live in your home?  1   Do you have any pets in your home?  No   Current or past  profession:  Truck driver   Do you exercise?  Some  Type and how often:  Walking    Current Outpatient Prescriptions on File Prior to Visit  Medication Sig Dispense Refill  . amitriptyline (ELAVIL) 50 MG tablet Take 1 tablet (50 mg total) by mouth daily.    Marland Kitchen apixaban (ELIQUIS) 5 MG TABS tablet Take 1 tablet (5 mg total) by mouth 2 (two) times daily. 180 tablet 1  . atorvastatin (LIPITOR) 40 MG tablet Take 1 tablet (40 mg total) by mouth daily. 90 tablet 1  . b complex vitamins tablet Take 1 tablet by mouth daily.    . Cholecalciferol (VITAMIN D-3) 5000 UNITS TABS Take 5,000 Units by mouth once a week.     . gabapentin (NEURONTIN) 600 MG tablet Take 1 tablet (600 mg total) by mouth 3 (three) times daily. 270 tablet 3  . metFORMIN (GLUCOPHAGE-XR) 500 MG 24 hr tablet Take 1 tablet (500 mg total) by mouth daily with supper. 90 tablet 1  . metoprolol succinate (TOPROL-XL) 50 MG 24 hr tablet Take 1 tablet (50 mg total) by mouth daily. Take with  or immediately following a meal. 90 tablet 1  . topiramate (TOPAMAX) 25 MG tablet Take 1 tablet (25 mg total) by mouth daily. 90 tablet 1  . vitamin C (ASCORBIC ACID) 500 MG tablet Take 500 mg by mouth daily.     No current facility-administered medications on file prior to visit.    No Known Allergies  Family History  Problem Relation Age of Onset  . Heart disease Mother     Living  . Stroke Father 85    Deceased  . Diabetes Sister     BP 122/62 mmHg  Pulse 66  Temp(Src) 98.6 F (37 C) (Oral)  Ht   SpO2 99%  Review of Systems No weight change.      Objective:   Physical Exam VITAL SIGNS:  See vs page GENERAL: no distress.  In wheelchair Ext: left BKA  Pulses: right dorsalis pedis intact.  RLE: no deformity. 1+ leg edema.   Skin: no ulcer on the right foot: normal color and temp: healed surgical scar over the right medial malleolus (fx repair).  Neuro: sensation is intact to touch on the right foot, but decreased from normal.   Ext: There is onychomycosis of the right toenails.  There is a 2 cm shallow ulcer at the left BKA stump.     A1c=6.2%    Assessment & Plan:  DM: well-controlled stump ulcer, new.  Patient is advised the following: Patient Instructions  check your blood sugar once a day.  vary the time of day when you check, between before the 3 meals, and at bedtime.  also check if you have symptoms of your blood sugar being too high or too low.  please keep a record of the readings and bring it to your next appointment here.  You can write it on any piece of paper.  please call us sooner if your blood sugar goes below 70, or if you have a lot of readings over 200.   Please continue the same metformin.   For the ulcer on your left leg, keep it covered with antibiotic ointment and a bandaid.  Call if you want to go back to Dr Sharol Given.   Please come back for a follow-up appointment in 6 months.

## 2016-02-29 ENCOUNTER — Encounter: Payer: Self-pay | Admitting: Sports Medicine

## 2016-02-29 ENCOUNTER — Ambulatory Visit (INDEPENDENT_AMBULATORY_CARE_PROVIDER_SITE_OTHER): Payer: Medicare HMO | Admitting: Sports Medicine

## 2016-02-29 DIAGNOSIS — IMO0002 Reserved for concepts with insufficient information to code with codable children: Secondary | ICD-10-CM

## 2016-02-29 DIAGNOSIS — M79671 Pain in right foot: Secondary | ICD-10-CM

## 2016-02-29 DIAGNOSIS — B351 Tinea unguium: Secondary | ICD-10-CM

## 2016-02-29 DIAGNOSIS — Z89432 Acquired absence of left foot: Secondary | ICD-10-CM

## 2016-02-29 DIAGNOSIS — E1140 Type 2 diabetes mellitus with diabetic neuropathy, unspecified: Secondary | ICD-10-CM | POA: Diagnosis not present

## 2016-02-29 NOTE — Progress Notes (Signed)
Patient ID: Darrell Horne, male   DOB: 1946/02/06, 70 y.o.   MRN: [id number]   Subjective: Darrell Horne is a 70 y.o. male patient with history of type2 diabetes who returns to office today complaining of long, painful nails  while ambulating in shoes; unable to trim. Patient states that the glucose reading this morning was not recorded. Patient denies any new changes in medication or new problems. Patient denies any new cramping, numbness, burning or tingling in the leg.  Patient Active Problem List   Diagnosis Date Noted  . Diabetic polyneuropathy associated with type 2 diabetes mellitus ([place]) 12/23/2015  . Alcoholic peripheral neuropathy ([place]) 12/23/2015  . Sensory ataxia 12/23/2015  . Headache 11/04/2015  . Chronic low back pain 11/04/2015  . Insomnia 11/04/2015  . Neuropathy due to medical condition ([place]) 04/08/2015  . Diabetes ([place]) 04/09/2014  . PAF (paroxysmal atrial fibrillation) ([place])   . DM2 (diabetes mellitus, type 2) ([place])   . HTN (hypertension)   . Palpitations   . HLD (hyperlipidemia)    Current Outpatient Prescriptions on File Prior to Visit  Medication Sig Dispense Refill  . amitriptyline (ELAVIL) 50 MG tablet Take 1 tablet (50 mg total) by mouth daily.    Marland Kitchen apixaban (ELIQUIS) 5 MG TABS tablet Take 1 tablet (5 mg total) by mouth 2 (two) times daily. 180 tablet 1  . atorvastatin (LIPITOR) 40 MG tablet Take 1 tablet (40 mg total) by mouth daily. 90 tablet 1  . b complex vitamins tablet Take 1 tablet by mouth daily.    . Cholecalciferol (VITAMIN D-3) 5000 UNITS TABS Take 5,000 Units by mouth once a week.     . gabapentin (NEURONTIN) 600 MG tablet Take 1 tablet (600 mg total) by mouth 3 (three) times daily. 270 tablet 3  . metFORMIN (GLUCOPHAGE-XR) 500 MG 24 hr tablet Take 1 tablet (500 mg total) by mouth daily with supper. 90 tablet 1  . metoprolol succinate (TOPROL-XL) 50 MG 24 hr tablet Take 1 tablet (50 mg total) by mouth daily. Take with or immediately following a meal.  90 tablet 1  . topiramate (TOPAMAX) 25 MG tablet Take 1 tablet (25 mg total) by mouth daily. 90 tablet 1  . vitamin C (ASCORBIC ACID) 500 MG tablet Take 500 mg by mouth daily.     No current facility-administered medications on file prior to visit.   No Known Allergies   Objective: General: Patient is awake, alert, and oriented x 3 and in no acute distress.  Integument: Skin is warm, dry and supple. Nails are tender, long, thickened and dystrophic with subungual debris, consistent with onychomycosis, 1-5 on right. No signs of infection. No open lesions or preulcerative lesions present. Remaining integument unremarkable.  Vasculature:  Dorsalis Pedis pulse 1/4. Posterior Tibial pulse  1/4 on right Capillary fill time <3 sec 1-5 on right. No hair growth to the level of the digits. Temperature gradient within normal limits. No varicosities present. No edema present.   Neurology: The patient has absent sensation measured with a 5.07/10g Semmes Weinstein Monofilament at all pedal sites on right. Vibratory sensation absent on right with tuning fork. No Babinski sign present.   Musculoskeletal: Left foot amputation 2013. Mild hammertoes on right. Muscular strength 5/5 in all lower extremity muscular groups on right without pain on range of motion. No tenderness with calf compression on right.   Assessment and Plan: Problem List Items Addressed This Visit    None    Visit Diagnoses  Dermatophytosis of nail    -  Primary    Type 2 diabetes, controlled, with neuropathy ([place])        Foot amputation status, left ([facility])        Foot pain, right          -Examined patient. -Discussed and educated patient on diabetic foot care, especially with  regards to the vascular, neurological and musculoskeletal systems.  -Stressed the importance of good glycemic control and the detriment of not  controlling glucose levels in relation to the foot. -Mechanically debrided all nails 1-5 on right using  sterile nail nipper and filed with dremel without incident  -Cont with Gabapentin as rx by PCP -Answered all patient questions -Patient to return in 3 months for at risk foot care -Patient advised to call the office if any problems or questions arise in the meantime.  Landis Martins, DPM

## 2016-03-22 DIAGNOSIS — E119 Type 2 diabetes mellitus without complications: Secondary | ICD-10-CM | POA: Diagnosis not present

## 2016-03-22 DIAGNOSIS — H2511 Age-related nuclear cataract, right eye: Secondary | ICD-10-CM | POA: Diagnosis not present

## 2016-03-22 DIAGNOSIS — H2512 Age-related nuclear cataract, left eye: Secondary | ICD-10-CM | POA: Diagnosis not present

## 2016-03-22 DIAGNOSIS — H43811 Vitreous degeneration, right eye: Secondary | ICD-10-CM | POA: Diagnosis not present

## 2016-03-22 LAB — HM DIABETES EYE EXAM

## 2016-03-28 ENCOUNTER — Encounter: Payer: Self-pay | Admitting: Endocrinology

## 2016-04-01 ENCOUNTER — Ambulatory Visit (INDEPENDENT_AMBULATORY_CARE_PROVIDER_SITE_OTHER): Payer: Medicare HMO | Admitting: Internal Medicine

## 2016-04-01 ENCOUNTER — Encounter: Payer: Self-pay | Admitting: Internal Medicine

## 2016-04-01 VITALS — BP 138/82 | HR 88 | Temp 98.1°F | Ht 70.0 in | Wt 264.4 lb

## 2016-04-01 DIAGNOSIS — Z89522 Acquired absence of left knee: Secondary | ICD-10-CM

## 2016-04-01 DIAGNOSIS — R51 Headache: Secondary | ICD-10-CM

## 2016-04-01 DIAGNOSIS — M62830 Muscle spasm of back: Secondary | ICD-10-CM | POA: Diagnosis not present

## 2016-04-01 DIAGNOSIS — E785 Hyperlipidemia, unspecified: Secondary | ICD-10-CM | POA: Diagnosis not present

## 2016-04-01 DIAGNOSIS — E1142 Type 2 diabetes mellitus with diabetic polyneuropathy: Secondary | ICD-10-CM | POA: Diagnosis not present

## 2016-04-01 DIAGNOSIS — G8929 Other chronic pain: Secondary | ICD-10-CM

## 2016-04-01 DIAGNOSIS — I480 Paroxysmal atrial fibrillation: Secondary | ICD-10-CM

## 2016-04-01 DIAGNOSIS — R519 Headache, unspecified: Secondary | ICD-10-CM

## 2016-04-01 DIAGNOSIS — M545 Low back pain: Secondary | ICD-10-CM

## 2016-04-01 DIAGNOSIS — Z89512 Acquired absence of left leg below knee: Secondary | ICD-10-CM

## 2016-04-01 DIAGNOSIS — I10 Essential (primary) hypertension: Secondary | ICD-10-CM | POA: Diagnosis not present

## 2016-04-01 MED ORDER — TIZANIDINE HCL 4 MG PO TABS: 4.0000 mg | ORAL_TABLET | Freq: Four times a day (QID) | ORAL | Status: DC | PRN

## 2016-04-01 NOTE — Patient Instructions (Signed)
Start muscle relaxer as needed for spasms  Continue other medications as ordered  Follow up with specialists as scheduled  Follow up in 4 mos for routine visit

## 2016-04-01 NOTE — Progress Notes (Signed)
Patient ID: Darrell Horne, male   DOB: [birth year], 70 y.o.   MRN: [id number]    Location:  [facility] Place of Service: OFFICE  Chief Complaint  Patient presents with  . Medical Management of Chronic Issues    4 month follow up, patient states started saturday he has been having pains all over, mainly his back, Here with wife      HPI:  70 yo male seen today for f/u. He reports he awakened last week with sudden sharp pain in right side-->generalized. Pain resolved gradually and was completely gone 2 days ago. No known trauma. He cannot recall a precipitating event. Tried oxycodone and tylenol but no relief. Wife present  DM2 - controlled on metformin. Has neuropathy that is uncontrolled and takes gabapentin 1200mg  per day and elavil. 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 He wears a prosthesis. Uses a cane most times but does have a walker when anticipating prolonged standing/walking. Followed by Endo Dr Loanne Drilling. CBGs in 140s. No low BS reactions. He saw podiatry in Feb 2017 and nails debrided. A1c 6.2%. Last eye exam in June 2016. Urine micro/Cr ratio <5.8.  HTN - BP controlled on metoprolol  Hyperlipidemia - stable on lipitor  PAF - rate controlled on metoprolol. Takes eliquis for anticoagulation. Followed by cardio Dr Oval Linsey. Underwent lexiscan myoview in 06/2015 which was nml. 2D echo showed moderate diastolic dysfunction with nml EF. Last ECG in 06/2015 showed 1st degree AVB and LBBB. Previous cardio Dr at [facility]. Occasional palpitations  HA - stable on topamax. Chronic intractable. Followed by neurology Dr Posey Pronto. He was supposed to stop elavil but resumed it as his neuropathy worsened.   Insomnia - stable on elavil  Chronic LBP - pain now stable with prn Norco. Followed by Ortho Dr Sharol Given. Of note, he underwent left rotator cuff repair in Dec 2016.   Past Medical History  Diagnosis Date  . PAF (paroxysmal atrial fibrillation) ([place])   . DM2 (diabetes mellitus, type 2)  ([place])   . HTN (hypertension)   . HLD (hyperlipidemia)   . Chest pain   . Palpitations   . Diabetes mellitus   . GERD (gastroesophageal reflux disease)     Past Surgical History  Procedure Laterality Date  . Total ankle arthroplasty  [id number]  . Cholecystectomy    . Shoulder surgery  [id number]    Dr. Meridee Score  . Hernia repair    . Appendectomy    . Leg amputation Left [id number]    Dr. Kendell Bane  . Colonoscopy      Dr. Michail Sermon    Patient Care Team: Gildardo Cranker, DO as PCP - General (Internal Medicine)  Social History   Social History  . Marital Status: Divorced    Spouse Name: N/A  . Number of Children: 4  . Years of Education: N/A   Occupational History  . retired Administrator    Social History Main Topics  . Smoking status: Never Smoker   . Smokeless tobacco: Never Used  . Alcohol Use: Yes     Comment: Previously drinking a fifth of liquor every two days x 13 years  . Drug Use: No  . Sexual Activity: Not on file   Other Topics Concern  . Not on file   Social History Narrative   Diet:   Do you drink/eat things with caffeine?  Yes   Marital status:  Divorced.  What year were you married?  2001   Do you live  in a house, assisted living, condo, apartment, trailer, etc.?  Apartment   Is it one or more stories?  1 story   How many persons live in your home?  1   Do you have any pets in your home?  No   Current or past profession:  Truck driver   Do you exercise?  Some  Type and how often:  Walking     reports that he has never smoked. He has never used smokeless tobacco. He reports that he drinks alcohol. He reports that he does not use illicit drugs.  Family History  Problem Relation Age of Onset  . Heart disease Mother     Living  . Stroke Father 80    Deceased  . Diabetes Sister    Family Status  Relation Status Death Age  . Mother Alive   . Father Deceased   . Sister Alive   . Brother Alive   . Daughter Alive   . Daughter Alive   .  Daughter Alive   . Son Alive   . Sister Alive   . Sister Alive   . Sister Alive      No Known Allergies  Medications: Patient's Medications  New Prescriptions   No medications on file  Previous Medications   AMITRIPTYLINE (ELAVIL) 50 MG TABLET    Take 1 tablet (50 mg total) by mouth daily.   APIXABAN (ELIQUIS) 5 MG TABS TABLET    Take 1 tablet (5 mg total) by mouth 2 (two) times daily.   ATORVASTATIN (LIPITOR) 40 MG TABLET    Take 1 tablet (40 mg total) by mouth daily.   B COMPLEX VITAMINS TABLET    Take 1 tablet by mouth daily.   CHOLECALCIFEROL (VITAMIN D-3) 5000 UNITS TABS    Take 5,000 Units by mouth once a week.    GABAPENTIN (NEURONTIN) 600 MG TABLET    Take 1 tablet (600 mg total) by mouth 3 (three) times daily.   METFORMIN (GLUCOPHAGE-XR) 500 MG 24 HR TABLET    Take 1 tablet (500 mg total) by mouth daily with supper.   METOPROLOL SUCCINATE (TOPROL-XL) 50 MG 24 HR TABLET    Take 1 tablet (50 mg total) by mouth daily. Take with or immediately following a meal.   TOPIRAMATE (TOPAMAX) 25 MG TABLET    Take 1 tablet (25 mg total) by mouth daily.   VITAMIN C (ASCORBIC ACID) 500 MG TABLET    Take 500 mg by mouth daily.  Modified Medications   No medications on file  Discontinued Medications   No medications on file    Review of Systems  Cardiovascular: Positive for palpitations.  Genitourinary: Positive for flank pain.  Musculoskeletal: Positive for myalgias, back pain, arthralgias and gait problem.  Psychiatric/Behavioral: Positive for sleep disturbance.  All other systems reviewed and are negative.   Filed Vitals:   04/01/16 1539  BP: 138/82  Pulse: 88  Temp: 98.1 F (36.7 C)  TempSrc: Oral  Height: 5\' 10"  (1.778 m)  Weight: 264 lb 6.4 oz (119.931 kg)  SpO2: 96%   Body mass index is 37.94 kg/(m^2).  Physical Exam  Constitutional: He appears well-developed and well-nourished.  HENT:  Mouth/Throat: Oropharynx is clear and moist.  Eyes: Pupils are equal, round,  and reactive to light. No scleral icterus.  Neck: Neck supple. Carotid bruit is not present.  Cardiovascular: Normal rate and intact distal pulses.  An irregularly irregular rhythm present. Exam reveals no gallop and no friction rub.  Murmur heard.  Systolic murmur is present with a grade of 1/6  Pulses:      Left dorsalis pedis pulse not accessible.       Left posterior tibial pulse not accessible.  Left BKA. No RLE edema or calf TTP  Pulmonary/Chest: Effort normal and breath sounds normal. He has no wheezes. He has no rales. He exhibits no tenderness.  Abdominal: Soft. Bowel sounds are normal. He exhibits no distension, no abdominal bruit, no pulsatile midline mass and no mass. There is no tenderness. There is no rebound and no guarding.  Musculoskeletal: He exhibits edema and tenderness.       Back:  Left BKA  Lymphadenopathy:    He has no cervical adenopathy.  Neurological: He is alert.  Skin: Skin is warm and dry. No rash noted.  Psychiatric: He has a normal mood and affect. His behavior is normal.     Labs reviewed: Abstract on 03/28/2016  Component Date Value Ref Range Status  . HM Diabetic Eye Exam 03/22/2016 No Retinopathy  No Retinopathy Final  Office Visit on 01/25/2016  Component Date Value Ref Range Status  . Hemoglobin A1C 01/25/2016 6.2   Final    No results found.   Assessment/Plan   ICD-9-CM ICD-10-CM   1. Chronic low back pain - mildly exacerbated 724.2 M54.5    338.29 G89.29   2. PAF (paroxysmal atrial fibrillation) (HCC) - rate controlled 427.31 I48.0   3. Type 2 diabetes mellitus with diabetic polyneuropathy, without long-term current use of insulin (HCC) 250.60 E11.42    357.2    4. Essential hypertension 401.9 I10   5. Muscle spasm of back - new 724.8 M62.830   6. HLD (hyperlipidemia) 272.4 E78.5   7. Hx of BKA, left V49.75 Z89.522   8. Chronic intractable headache, unspecified headache type 784.0 R51    Start muscle relaxer as needed for  spasms  Continue other medications as ordered  Follow up with specialists as scheduled  Follow up in 4 mos for routine visit   Lacora Folmer S. Perlie Gold  [facility] [address] [place], [place] [address] [phone] Cell (Monday-Friday 8 AM - 5 PM) [phone] After 5 PM and follow prompts

## 2016-04-13 ENCOUNTER — Ambulatory Visit: Payer: Medicare HMO | Admitting: Internal Medicine

## 2016-04-17 ENCOUNTER — Observation Stay (HOSPITAL_COMMUNITY)
Admission: EM | Admit: 2016-04-17 | Discharge: 2016-04-19 | Disposition: A | Discharge status: Home / Self Care | Payer: Medicare HMO | Attending: Internal Medicine | Admitting: Internal Medicine

## 2016-04-17 ENCOUNTER — Emergency Department (HOSPITAL_COMMUNITY): Payer: Medicare HMO

## 2016-04-17 ENCOUNTER — Encounter (HOSPITAL_COMMUNITY): Payer: Self-pay | Admitting: Emergency Medicine

## 2016-04-17 DIAGNOSIS — Z7984 Long term (current) use of oral hypoglycemic drugs: Secondary | ICD-10-CM | POA: Diagnosis not present

## 2016-04-17 DIAGNOSIS — K219 Gastro-esophageal reflux disease without esophagitis: Secondary | ICD-10-CM | POA: Diagnosis not present

## 2016-04-17 DIAGNOSIS — Z79899 Other long term (current) drug therapy: Secondary | ICD-10-CM | POA: Diagnosis not present

## 2016-04-17 DIAGNOSIS — R55 Syncope and collapse: Secondary | ICD-10-CM | POA: Diagnosis not present

## 2016-04-17 DIAGNOSIS — I10 Essential (primary) hypertension: Secondary | ICD-10-CM | POA: Diagnosis present

## 2016-04-17 DIAGNOSIS — E785 Hyperlipidemia, unspecified: Secondary | ICD-10-CM | POA: Diagnosis not present

## 2016-04-17 DIAGNOSIS — E119 Type 2 diabetes mellitus without complications: Secondary | ICD-10-CM

## 2016-04-17 DIAGNOSIS — R404 Transient alteration of awareness: Secondary | ICD-10-CM | POA: Diagnosis not present

## 2016-04-17 DIAGNOSIS — E1142 Type 2 diabetes mellitus with diabetic polyneuropathy: Secondary | ICD-10-CM | POA: Diagnosis present

## 2016-04-17 DIAGNOSIS — I480 Paroxysmal atrial fibrillation: Secondary | ICD-10-CM | POA: Insufficient documentation

## 2016-04-17 DIAGNOSIS — I959 Hypotension, unspecified: Secondary | ICD-10-CM | POA: Diagnosis not present

## 2016-04-17 DIAGNOSIS — I5032 Chronic diastolic (congestive) heart failure: Secondary | ICD-10-CM | POA: Diagnosis not present

## 2016-04-17 DIAGNOSIS — I110 Hypertensive heart disease with heart failure: Secondary | ICD-10-CM | POA: Diagnosis not present

## 2016-04-17 DIAGNOSIS — Z7901 Long term (current) use of anticoagulants: Secondary | ICD-10-CM | POA: Diagnosis not present

## 2016-04-17 DIAGNOSIS — Z9049 Acquired absence of other specified parts of digestive tract: Secondary | ICD-10-CM | POA: Diagnosis not present

## 2016-04-17 DIAGNOSIS — R42 Dizziness and giddiness: Secondary | ICD-10-CM | POA: Diagnosis not present

## 2016-04-17 DIAGNOSIS — R4781 Slurred speech: Secondary | ICD-10-CM | POA: Diagnosis not present

## 2016-04-17 DIAGNOSIS — G63 Polyneuropathy in diseases classified elsewhere: Secondary | ICD-10-CM | POA: Diagnosis present

## 2016-04-17 DIAGNOSIS — D649 Anemia, unspecified: Secondary | ICD-10-CM | POA: Insufficient documentation

## 2016-04-17 LAB — CBG MONITORING, ED: Glucose-Capillary: 114 mg/dL — ABNORMAL HIGH (ref 65–99)

## 2016-04-17 LAB — CBC
HCT: 35 % — ABNORMAL LOW (ref 39.0–52.0)
Hemoglobin: 11.3 g/dL — ABNORMAL LOW (ref 13.0–17.0)
MCH: 29.8 pg (ref 26.0–34.0)
MCHC: 32.3 g/dL (ref 30.0–36.0)
MCV: 92.3 fL (ref 78.0–100.0)
Platelets: 142 10*3/uL — ABNORMAL LOW (ref 150–400)
RBC: 3.79 MIL/uL — ABNORMAL LOW (ref 4.22–5.81)
RDW: 14.7 % (ref 11.5–15.5)
WBC: 7.2 10*3/uL (ref 4.0–10.5)

## 2016-04-17 LAB — PROTIME-INR
INR: 1.53 — ABNORMAL HIGH (ref 0.00–1.49)
Prothrombin Time: 18.5 seconds — ABNORMAL HIGH (ref 11.6–15.2)

## 2016-04-17 LAB — BRAIN NATRIURETIC PEPTIDE: B Natriuretic Peptide: 202.1 pg/mL — ABNORMAL HIGH (ref 0.0–100.0)

## 2016-04-17 LAB — TROPONIN I: Troponin I: <0.03 ng/mL (ref <0.03)

## 2016-04-17 LAB — BASIC METABOLIC PANEL
Anion gap: 3 — ABNORMAL LOW (ref 5–15)
BUN: 9 mg/dL (ref 6–20)
CO2: 25 mmol/L (ref 22–32)
Calcium: 8.3 mg/dL — ABNORMAL LOW (ref 8.9–10.3)
Chloride: 110 mmol/L (ref 101–111)
Creatinine, Ser: 1 mg/dL (ref 0.61–1.24)
GFR calc Af Amer: >60 mL/min (ref >60)
GFR calc non Af Amer: >60 mL/min (ref >60)
Glucose, Bld: 155 mg/dL — ABNORMAL HIGH (ref 65–99)
Potassium: 3.5 mmol/L (ref 3.5–5.1)
Sodium: 138 mmol/L (ref 135–145)

## 2016-04-17 LAB — I-STAT CG4 LACTIC ACID, ED: Lactic Acid, Venous: 1.08 mmol/L (ref 0.5–1.9)

## 2016-04-17 LAB — URINALYSIS, ROUTINE W REFLEX MICROSCOPIC
Bilirubin Urine: NEGATIVE
Glucose, UA: NEGATIVE mg/dL
Hgb urine dipstick: NEGATIVE
Ketones, ur: NEGATIVE mg/dL
Leukocytes, UA: NEGATIVE
Nitrite: NEGATIVE
Protein, ur: NEGATIVE mg/dL
Specific Gravity, Urine: 1.015 (ref 1.005–1.030)
pH: 7 (ref 5.0–8.0)

## 2016-04-17 LAB — GLUCOSE, CAPILLARY: Glucose-Capillary: 105 mg/dL — ABNORMAL HIGH (ref 65–99)

## 2016-04-17 LAB — I-STAT TROPONIN, ED: Troponin i, poc: 0 ng/mL (ref 0.00–0.08)

## 2016-04-17 MED ORDER — TIZANIDINE HCL 4 MG PO TABS
4.0000 mg | ORAL_TABLET | Freq: Four times a day (QID) | ORAL | Status: DC | PRN
  Filled 2016-04-17: qty 1

## 2016-04-17 MED ORDER — AMITRIPTYLINE HCL 50 MG PO TABS
25.0000 mg | ORAL_TABLET | Freq: Every day | ORAL | Status: DC
  Administered 2016-04-17 – 2016-04-18 (×2): 25 mg via ORAL
  Filled 2016-04-17 (×3): qty 1

## 2016-04-17 MED ORDER — TOPIRAMATE 25 MG PO TABS
25.0000 mg | ORAL_TABLET | Freq: Every day | ORAL | Status: DC
  Administered 2016-04-18 – 2016-04-19 (×2): 25 mg via ORAL
  Filled 2016-04-17 (×2): qty 1

## 2016-04-17 MED ORDER — SODIUM CHLORIDE 0.9 % IV SOLN
INTRAVENOUS | Status: DC
  Administered 2016-04-17: 21:00:00 via INTRAVENOUS

## 2016-04-17 MED ORDER — SODIUM CHLORIDE 0.9% FLUSH
3.0000 mL | Freq: Two times a day (BID) | INTRAVENOUS | Status: DC
  Administered 2016-04-17: 3 mL via INTRAVENOUS

## 2016-04-17 MED ORDER — SODIUM CHLORIDE 0.9 % IV BOLUS (SEPSIS)
1000.0000 mL | Freq: Once | INTRAVENOUS | Status: AC
  Administered 2016-04-17: 1000 mL via INTRAVENOUS

## 2016-04-17 MED ORDER — APIXABAN 5 MG PO TABS
5.0000 mg | ORAL_TABLET | Freq: Two times a day (BID) | ORAL | Status: DC
Start: 2016-04-17 — End: 2016-04-19
  Administered 2016-04-17 – 2016-04-19 (×4): 5 mg via ORAL
  Filled 2016-04-17 (×4): qty 1

## 2016-04-17 MED ORDER — ATORVASTATIN CALCIUM 40 MG PO TABS
40.0000 mg | ORAL_TABLET | Freq: Every day | ORAL | Status: DC
  Administered 2016-04-18 – 2016-04-19 (×2): 40 mg via ORAL
  Filled 2016-04-17 (×2): qty 1

## 2016-04-17 MED ORDER — ARTIFICIAL TEARS OP OINT
TOPICAL_OINTMENT | Freq: Two times a day (BID) | OPHTHALMIC | Status: DC | PRN
  Filled 2016-04-17: qty 3.5

## 2016-04-17 MED ORDER — GABAPENTIN 600 MG PO TABS
600.0000 mg | ORAL_TABLET | Freq: Three times a day (TID) | ORAL | Status: DC
  Administered 2016-04-17 – 2016-04-19 (×6): 600 mg via ORAL
  Filled 2016-04-17 (×6): qty 1

## 2016-04-17 MED ORDER — VITAMIN C 500 MG PO TABS
500.0000 mg | ORAL_TABLET | Freq: Two times a day (BID) | ORAL | Status: DC
  Administered 2016-04-17 – 2016-04-19 (×4): 500 mg via ORAL
  Filled 2016-04-17 (×4): qty 1

## 2016-04-17 MED ORDER — POLYETHYL GLYCOL-PROPYL GLYCOL 0.4-0.3 % OP GEL: Freq: Two times a day (BID) | OPHTHALMIC | Status: DC | PRN

## 2016-04-17 MED ORDER — INSULIN ASPART 100 UNIT/ML ~~LOC~~ SOLN
0.0000 [IU] | Freq: Three times a day (TID) | SUBCUTANEOUS | Status: DC
  Administered 2016-04-19: 1 [IU] via SUBCUTANEOUS

## 2016-04-17 MED ORDER — INSULIN ASPART 100 UNIT/ML ~~LOC~~ SOLN: 0.0000 [IU] | Freq: Every day | SUBCUTANEOUS | Status: DC

## 2016-04-17 NOTE — ED Notes (Signed)
Admitting at bedside 

## 2016-04-17 NOTE — ED Provider Notes (Signed)
CSN: [id number]     Arrival date & time 04/17/16  1522 History   First MD Initiated Contact with Patient 04/17/16 1541     Chief Complaint  Patient presents with  . Near Syncope     (Consider location/radiation/quality/duration/timing/severity/associated sxs/prior Treatment) HPI   She is a 70 year old male with history of paroxysmal A. fib on L [place] with history of diabetes hypertension hyperlipidemia. Patient presents today after true syncope. Patient was eating his lunch and his family saw him slump over become pale and diaphoretic. They called EMS. This is non-positional. Not associated with chest pain or shortness of breath.  Patient's had no recent infectious symptoms.  No recent car trips, prolonged immobility.  Patient does report that he might have felt like it was coming on with visual issues, but does not remember it happening.  Past Medical History  Diagnosis Date  . PAF (paroxysmal atrial fibrillation) ([place])   . DM2 (diabetes mellitus, type 2) ([place])   . HTN (hypertension)   . HLD (hyperlipidemia)   . Chest pain   . Palpitations   . Diabetes mellitus   . GERD (gastroesophageal reflux disease)    Past Surgical History  Procedure Laterality Date  . Total ankle arthroplasty  [id number]  . Cholecystectomy    . Shoulder surgery  [id number]    Dr. Meridee Score  . Hernia repair    . Appendectomy    . Leg amputation Left [id number]    Dr. Kendell Bane  . Colonoscopy      Dr. Michail Sermon   Family History  Problem Relation Age of Onset  . Heart disease Mother     Living  . Stroke Father 83    Deceased  . Diabetes Sister    Social History  Substance Use Topics  . Smoking status: Never Smoker   . Smokeless tobacco: Never Used  . Alcohol Use: Yes     Comment: Previously drinking a fifth of liquor every two days x 13 years    Review of Systems  Constitutional: Negative for fever and activity change.  HENT: Negative for congestion.   Eyes: Positive for visual  disturbance.  Respiratory: Negative for shortness of breath.   Cardiovascular: Negative for chest pain.  Gastrointestinal: Negative for abdominal pain.  Genitourinary: Negative for dysuria.  Musculoskeletal: Negative for back pain.  Neurological: Positive for syncope. Negative for dizziness, facial asymmetry, weakness and headaches.  Psychiatric/Behavioral: Negative for confusion and agitation.  All other systems reviewed and are negative.     Allergies  Review of patient's allergies indicates no known allergies.  Home Medications   Prior to Admission medications   Medication Sig Start Date End Date Taking? Authorizing Provider  amitriptyline (ELAVIL) 25 MG tablet Take 25 mg by mouth at bedtime. 02/05/16  Yes Historical Provider, MD  apixaban (ELIQUIS) 5 MG TABS tablet Take 1 tablet (5 mg total) by mouth 2 (two) times daily. 12/07/15  Yes Gildardo Cranker, DO  atorvastatin (LIPITOR) 40 MG tablet Take 1 tablet (40 mg total) by mouth daily. 12/07/15  Yes Gildardo Cranker, DO  b complex vitamins tablet Take 1 tablet by mouth daily.   Yes Historical Provider, MD  Cholecalciferol (VITAMIN D PO) Take 1 tablet by mouth 2 (two) times daily.   Yes Historical Provider, MD  gabapentin (NEURONTIN) 600 MG tablet Take 1 tablet (600 mg total) by mouth 3 (three) times daily. 12/09/15  Yes Monica Eulas Post, DO  metFORMIN (GLUCOPHAGE-XR) 500 MG 24 hr tablet Take 1 tablet (500 mg  total) by mouth daily with supper. 12/07/15  Yes Gildardo Cranker, DO  metoprolol succinate (TOPROL-XL) 50 MG 24 hr tablet Take 1 tablet (50 mg total) by mouth daily. Take with or immediately following a meal. Patient taking differently: Take 50 mg by mouth daily with supper. Take with or immediately following a meal. 12/07/15  Yes Gildardo Cranker, DO  Polyethyl Glycol-Propyl Glycol (SYSTANE OP) Place 1 drop into both eyes 2 (two) times daily as needed (dry eyes/ burning).   Yes Historical Provider, MD  tiZANidine (ZANAFLEX) 4 MG tablet Take 1 tablet  (4 mg total) by mouth every 6 (six) hours as needed for muscle spasms. 04/01/16  Yes Monica Eulas Post, DO  topiramate (TOPAMAX) 25 MG tablet Take 1 tablet (25 mg total) by mouth daily. 12/07/15  Yes Gildardo Cranker, DO  vitamin C (ASCORBIC ACID) 500 MG tablet Take 500 mg by mouth 2 (two) times daily.    Yes Historical Provider, MD  amitriptyline (ELAVIL) 50 MG tablet Take 1 tablet (50 mg total) by mouth daily. Patient not taking: Reported on 04/17/2016 12/23/15   Donika K Patel, DO   BP 120/64 mmHg  Pulse 65  Temp(Src) 97.8 F (36.6 C) (Oral)  Resp 18  Ht 5\' 10"  (1.778 m)  Wt 274 lb 14.6 oz (124.7 kg)  BMI 39.45 kg/m2  SpO2 98% Physical Exam  Constitutional: He is oriented to person, place, and time. He appears well-nourished.  HENT:  Head: Normocephalic.  Mouth/Throat: Oropharynx is clear and moist.  Eyes: Conjunctivae are normal.  Neck: No tracheal deviation present.  Cardiovascular:  Irregular irreguilar  Pulmonary/Chest: Effort normal. No stridor. No respiratory distress.  Abdominal: Soft. There is no tenderness. There is no guarding.  Musculoskeletal: Normal range of motion. He exhibits no edema.  Neurological: He is oriented to person, place, and time. No cranial nerve deficit.  Skin: Skin is warm and dry. No rash noted. He is not diaphoretic.  Psychiatric: He has a normal mood and affect. His behavior is normal.  Nursing note and vitals reviewed.   ED Course  Procedures (including critical care time) Labs Review Labs Reviewed  BASIC METABOLIC PANEL - Abnormal; Notable for the following:    Glucose, Bld 155 (*)    Calcium 8.3 (*)    Anion gap 3 (*)    All other components within normal limits  CBC - Abnormal; Notable for the following:    RBC 3.79 (*)    Hemoglobin 11.3 (*)    HCT 35.0 (*)    Platelets 142 (*)    All other components within normal limits  BRAIN NATRIURETIC PEPTIDE - Abnormal; Notable for the following:    B Natriuretic Peptide 202.1 (*)    All other  components within normal limits  PROTIME-INR - Abnormal; Notable for the following:    Prothrombin Time 18.5 (*)    INR 1.53 (*)    All other components within normal limits  URINALYSIS, ROUTINE W REFLEX MICROSCOPIC (NOT AT [facility]) - Abnormal; Notable for the following:    APPearance CLOUDY (*)    All other components within normal limits  GLUCOSE, CAPILLARY - Abnormal; Notable for the following:    Glucose-Capillary 105 (*)    All other components within normal limits  CBG MONITORING, ED - Abnormal; Notable for the following:    Glucose-Capillary 114 (*)    All other components within normal limits  URINE CULTURE  TROPONIN I  TROPONIN I  TROPONIN I  LIPID PANEL  HEMOGLOBIN A1C  TSH  I-STAT TROPOININ, ED  I-STAT CG4 LACTIC ACID, ED    Imaging Review Dg Chest 2 View  04/17/2016  CLINICAL DATA:  Slurred speech, hypotension EXAM: CHEST  2 VIEW COMPARISON:  07/07/2014 FINDINGS: Lungs are clear.  No pleural effusion or pneumothorax. The heart is normal in size. Mild degenerative changes of the visualized thoracolumbar spine. Cholecystectomy clips. IMPRESSION: No evidence of acute cardiopulmonary disease. Electronically Signed   By: Julian Hy M.D.   On: 04/17/2016 16:27   Ct Head Wo Contrast  04/17/2016  CLINICAL DATA:  Slurred speech, hypotensive EXAM: CT HEAD WITHOUT CONTRAST TECHNIQUE: Contiguous axial images were obtained from the base of the skull through the vertex without intravenous contrast. COMPARISON:  MRI brain dated 08/05/2015 FINDINGS: No evidence of parenchymal hemorrhage or extra-axial fluid collection. No mass lesion, mass effect, or midline shift. No CT evidence of acute infarction. Mild subcortical white matter and periventricular small vessel ischemic changes. Global cortical and central atrophy. Secondary ventricular prominence. The visualized paranasal sinuses are essentially clear. The mastoid air cells are unopacified. No evidence of calvarial fracture. IMPRESSION:  No evidence of acute intracranial abnormality. Atrophy with mild small vessel ischemic changes. Electronically Signed   By: Julian Hy M.D.   On: 04/17/2016 16:41   I have personally reviewed and evaluated these images and lab results as part of my medical decision-making.   EKG Interpretation   Date/Time:  Sunday April 17 2016 15:23:16 EDT Ventricular Rate:  65 PR Interval:    QRS Duration: 92 QT Interval:  406 QTC Calculation: 423 R Axis:   -30 Text Interpretation:  Sinus rhythm Left axis deviation Low voltage,  precordial leads no stemi noted on ekg Confirmed by Gerald Leitz  [phone]) on 04/17/2016 4:03:27 PM      MDM   Final diagnoses:  Syncope, unspecified syncope type  Hypotension, unspecified hypotension type   Patient is a 70 year old male with history of A. fib onEliquis  was presenting with syncope. Patient syncopized while at home. Was witnessed by family. He was pale and diaphoretic. Denies any chest pain. Denies any true prodrome.  We'll do a syncopeworkup. Including CT head given that he had some visual disturbance around the syncope.  Patient hypotensive in the emergency department we'll give 2 L of fluid. We'll look for infectious source, get lactic for sepsis.  Possible etiology- dehydration vs cardiac dysrhtymia vs vasovagal.    Annagrace Carr Julio Alm, MD 04/17/16 2333

## 2016-04-17 NOTE — ED Notes (Signed)
Pt provided with urinal and made aware of need for urine specimen.  Phlebotomy at bedside at this time.

## 2016-04-17 NOTE — ED Notes (Signed)
Attempted report x1. 

## 2016-04-17 NOTE — ED Notes (Signed)
Per EMS:  Pt presents to ED after family called EMS while they were eating lunch.  Pt became pale, quiet, and family states "slurred speech".  Fire was cancelled when pt began to "feel better".   Pt then experienced symptoms again and EMS was called again.  BP for EMS upon arrival was 76/48, 269ml of NaCl given.  CBG 229.  EMS sts pt passed stroke screen.  EKG unremarkable.  Pt BP in room is 87/50, NaCl wide open.

## 2016-04-17 NOTE — ED Notes (Signed)
Pt attempted for urine without success.  Urinal left at bedside

## 2016-04-17 NOTE — H&P (Signed)
History and Physical    KIMBERLY TOKARZ [id number] DOB: 01/12/1946 DOA: 04/17/2016  PCP: Gildardo Cranker, DO  Patient coming from: HOME.   Chief Complaint: passed out while eating lunch.  HPI: Darrell Horne is a 70 y.o. male with medical history significant of hypertension, PAF, DM, hyperlipidemia, chronic diastolic heart failure presents today by family after an episode of syncope while eating lunch.  As per the patient who is alert and oriented, reports he was eating lunch, and the next minute he knew people were asking him if he was okay. He denies having dizziness, blurry vision, chest pain, palpitations, chest pain, nauseated,. He denies any vomiting afterward. He denies fevers and chills. He reports feeling normal until then and back to baseline after that.  His CBG was 114 at the time. His BP was 80/30's on arrival. Orthostatics were not checked on arrival as he was hypotensive already.    ED Course: labs were unremarkable. CXR and UA were negative for infection. He was given a liter of NS bolus and maintenance fluids and was referred to medical service for admission for evaluation of syncope.   Review of Systems: As per HPI otherwise 10 point review of systems negative.    Past Medical History  Diagnosis Date  . PAF (paroxysmal atrial fibrillation) ([place])   . DM2 (diabetes mellitus, type 2) ([place])   . HTN (hypertension)   . HLD (hyperlipidemia)   . Chest pain   . Palpitations   . Diabetes mellitus   . GERD (gastroesophageal reflux disease)     Past Surgical History  Procedure Laterality Date  . Total ankle arthroplasty  [id number]  . Cholecystectomy    . Shoulder surgery  [id number]    Dr. Meridee Score  . Hernia repair    . Appendectomy    . Leg amputation Left [id number]    Dr. Kendell Bane  . Colonoscopy      Dr. Michail Sermon     reports that he has never smoked. He has never used smokeless tobacco. He reports that he drinks alcohol. He reports that he does not use illicit  drugs.  No Known Allergies  Family History  Problem Relation Age of Onset  . Heart disease Mother     Living  . Stroke Father 49    Deceased  . Diabetes Sister    Family history reviewed.   Prior to Admission medications   Medication Sig Start Date End Date Taking? Authorizing Provider  amitriptyline (ELAVIL) 25 MG tablet Take 25 mg by mouth at bedtime. 02/05/16  Yes Historical Provider, MD  apixaban (ELIQUIS) 5 MG TABS tablet Take 1 tablet (5 mg total) by mouth 2 (two) times daily. 12/07/15  Yes Gildardo Cranker, DO  atorvastatin (LIPITOR) 40 MG tablet Take 1 tablet (40 mg total) by mouth daily. 12/07/15  Yes Gildardo Cranker, DO  b complex vitamins tablet Take 1 tablet by mouth daily.   Yes Historical Provider, MD  Cholecalciferol (VITAMIN D PO) Take 1 tablet by mouth 2 (two) times daily.   Yes Historical Provider, MD  gabapentin (NEURONTIN) 600 MG tablet Take 1 tablet (600 mg total) by mouth 3 (three) times daily. 12/09/15  Yes Monica Eulas Post, DO  metFORMIN (GLUCOPHAGE-XR) 500 MG 24 hr tablet Take 1 tablet (500 mg total) by mouth daily with supper. 12/07/15  Yes Gildardo Cranker, DO  metoprolol succinate (TOPROL-XL) 50 MG 24 hr tablet Take 1 tablet (50 mg total) by mouth daily. Take with or immediately following a meal.  Patient taking differently: Take 50 mg by mouth daily with supper. Take with or immediately following a meal. 12/07/15  Yes Gildardo Cranker, DO  Polyethyl Glycol-Propyl Glycol (SYSTANE OP) Place 1 drop into both eyes 2 (two) times daily as needed (dry eyes/ burning).   Yes Historical Provider, MD  tiZANidine (ZANAFLEX) 4 MG tablet Take 1 tablet (4 mg total) by mouth every 6 (six) hours as needed for muscle spasms. 04/01/16  Yes Monica Eulas Post, DO  topiramate (TOPAMAX) 25 MG tablet Take 1 tablet (25 mg total) by mouth daily. 12/07/15  Yes Gildardo Cranker, DO  vitamin C (ASCORBIC ACID) 500 MG tablet Take 500 mg by mouth 2 (two) times daily.    Yes Historical Provider, MD  amitriptyline (ELAVIL)  50 MG tablet Take 1 tablet (50 mg total) by mouth daily. Patient not taking: Reported on 04/17/2016 12/23/15   Alda Berthold, DO    Physical Exam: Filed Vitals:   04/17/16 1600 04/17/16 1645 04/17/16 1745 04/17/16 1800  BP: 82/34 118/63 96/70 100/62  Pulse: 67 68 66 64  Temp:      TempSrc:      Resp: 15 17 17 17   Height:      Weight:      SpO2: 98% 97% 98% 97%      Constitutional: NAD, calm, comfortable Filed Vitals:   04/17/16 1600 04/17/16 1645 04/17/16 1745 04/17/16 1800  BP: 82/34 118/63 96/70 100/62  Pulse: 67 68 66 64  Temp:      TempSrc:      Resp: 15 17 17 17   Height:      Weight:      SpO2: 98% 97% 98% 97%   Eyes: PERRL, lids and conjunctivae normal ENMT: Mucous membranes are moist. Posterior pharynx clear of any exudate or lesions.Normal dentition.  Neck: normal, supple, no masses, no thyromegaly Respiratory: clear to auscultation bilaterally, no wheezing, no crackles. Normal respiratory effort. No accessory muscle use.  Cardiovascular: Regular rate and rhythm, no murmurs / rubs / gallops. No extremity edema. 2+ pedal pulses. No carotid bruits.  Abdomen: no tenderness, no masses palpated. No hepatosplenomegaly. Bowel sounds positive.  Musculoskeletal: no clubbing / cyanosis.  Left BKA .  Skin: no rashes, lesions, ulcers. No induration Neurologic: CN 2-12 grossly intact. Sensation intact, DTR normal. Strength 5/5 in all 4.  Psychiatric: Normal judgment and insight. Alert and oriented x 3. Normal mood.     Labs on Admission: I have personally reviewed following labs and imaging studies  CBC:  Recent Labs Lab 04/17/16 1537  WBC 7.2  HGB 11.3*  HCT 35.0*  MCV 92.3  PLT [id number]*   Basic Metabolic Panel:  Recent Labs Lab 04/17/16 1537  NA 138  K 3.5  CL 110  CO2 25  GLUCOSE 155*  BUN 9  CREATININE 1.00  CALCIUM 8.3*   GFR: Estimated Creatinine Clearance: 89.3 mL/min (by C-G formula based on Cr of 1). Liver Function Tests: No results for  input(s): AST, ALT, ALKPHOS, BILITOT, PROT, ALBUMIN in the last 168 hours. No results for input(s): LIPASE, AMYLASE in the last 168 hours. No results for input(s): AMMONIA in the last 168 hours. Coagulation Profile:  Recent Labs Lab 04/17/16 1558  INR 1.53*   Cardiac Enzymes: No results for input(s): CKTOTAL, CKMB, CKMBINDEX, TROPONINI in the last 168 hours. BNP (last 3 results) No results for input(s): PROBNP in the last 8760 hours. HbA1C: No results for input(s): HGBA1C in the last 72 hours. CBG:  Recent Labs Lab  04/17/16 1605  GLUCAP 114*   Lipid Profile: No results for input(s): CHOL, HDL, LDLCALC, TRIG, CHOLHDL, LDLDIRECT in the last 72 hours. Thyroid Function Tests: No results for input(s): TSH, T4TOTAL, FREET4, T3FREE, THYROIDAB in the last 72 hours. Anemia Panel: No results for input(s): VITAMINB12, FOLATE, FERRITIN, TIBC, IRON, RETICCTPCT in the last 72 hours. Urine analysis:    Component Value Date/Time   COLORURINE YELLOW 04/17/2016 1814   APPEARANCEUR CLOUDY* 04/17/2016 1814   LABSPEC 1.015 04/17/2016 1814   PHURINE 7.0 04/17/2016 1814   GLUCOSEU NEGATIVE 04/17/2016 1814   HGBUR NEGATIVE 04/17/2016 1814   BILIRUBINUR NEGATIVE 04/17/2016 1814   KETONESUR NEGATIVE 04/17/2016 1814   PROTEINUR NEGATIVE 04/17/2016 1814   UROBILINOGEN 0.2 07/08/2014 0137   NITRITE NEGATIVE 04/17/2016 1814   LEUKOCYTESUR NEGATIVE 04/17/2016 1814   Sepsis Labs: !!!!!!!!!!!!!!!!!!!!!!!!!!!!!!!!!!!!!!!!!!!! @LABRCNTIP (procalcitonin:4,lacticidven:4) )No results found for this or any previous visit (from the past 240 hour(s)).   Radiological Exams on Admission: Dg Chest 2 View  04/17/2016  CLINICAL DATA:  Slurred speech, hypotension EXAM: CHEST  2 VIEW COMPARISON:  07/07/2014 FINDINGS: Lungs are clear.  No pleural effusion or pneumothorax. The heart is normal in size. Mild degenerative changes of the visualized thoracolumbar spine. Cholecystectomy clips. IMPRESSION: No evidence of  acute cardiopulmonary disease. Electronically Signed   By: Julian Hy M.D.   On: 04/17/2016 16:27   Ct Head Wo Contrast  04/17/2016  CLINICAL DATA:  Slurred speech, hypotensive EXAM: CT HEAD WITHOUT CONTRAST TECHNIQUE: Contiguous axial images were obtained from the base of the skull through the vertex without intravenous contrast. COMPARISON:  MRI brain dated 08/05/2015 FINDINGS: No evidence of parenchymal hemorrhage or extra-axial fluid collection. No mass lesion, mass effect, or midline shift. No CT evidence of acute infarction. Mild subcortical white matter and periventricular small vessel ischemic changes. Global cortical and central atrophy. Secondary ventricular prominence. The visualized paranasal sinuses are essentially clear. The mastoid air cells are unopacified. No evidence of calvarial fracture. IMPRESSION: No evidence of acute intracranial abnormality. Atrophy with mild small vessel ischemic changes. Electronically Signed   By: Julian Hy M.D.   On: 04/17/2016 16:41    EKG: Independently reviewed. SINUS AT 65/min  Assessment/Plan Active Problems:   Syncope  Syncope: Unclear etiology, differential include, orthostatic hypotension, arrhythmias  Admit to telemetry under observation.  Serial troponins,  IV fluids.  Check orthostatics in am.  Pt had an echo in 06/2015 which showed LVEF of 60% and grade 2 diastolic dysfunction.  Wall motion was normal and there were no regional wall abnormalities.  He also had a stress test , which was low risk study,.  Repeat echocardiogram.  Monitor him overnight for arrhythmias   Hypertension: holding metoprolol for hypotension.    Diabetes mellitus: CBG (last 3)   Recent Labs  04/17/16 1605  GLUCAP 114*    Resume SSI.  Holding metformin.  hgba1c ordered.   Diabetic neuropathy: Resume gabapentin.    Hyperlipidemia: Lipid panel ordered.  Resume statin.   Mild normocytic anemia: Monitor.   Paf: Sinus today.    On eliquis.  Rate controlled.  Metoprolol held for hypotension, plan to restart in am.        DVT prophylaxis: eliquis.  Code Status:  (Full) Family Communication: wife at bedside.  Disposition Plan: pending PT eval  Consults called:none Admission status: obs Nanda Quinton MD [facility] Pager [phone]  If 7PM-7AM, please contact night-coverage [email or web address] Password [email or web address]  04/17/2016, 6:50 PM

## 2016-04-18 DIAGNOSIS — E1142 Type 2 diabetes mellitus with diabetic polyneuropathy: Secondary | ICD-10-CM | POA: Diagnosis not present

## 2016-04-18 DIAGNOSIS — R55 Syncope and collapse: Secondary | ICD-10-CM | POA: Diagnosis not present

## 2016-04-18 DIAGNOSIS — I480 Paroxysmal atrial fibrillation: Secondary | ICD-10-CM | POA: Diagnosis not present

## 2016-04-18 DIAGNOSIS — I10 Essential (primary) hypertension: Secondary | ICD-10-CM | POA: Diagnosis not present

## 2016-04-18 LAB — GLUCOSE, CAPILLARY
Glucose-Capillary: 102 mg/dL — ABNORMAL HIGH (ref 65–99)
Glucose-Capillary: 110 mg/dL — ABNORMAL HIGH (ref 65–99)
Glucose-Capillary: 85 mg/dL (ref 65–99)
Glucose-Capillary: 132 mg/dL — ABNORMAL HIGH (ref 65–99)

## 2016-04-18 LAB — LIPID PANEL
Cholesterol: 105 mg/dL (ref 0–200)
HDL: 47 mg/dL (ref >40)
LDL Cholesterol: 48 mg/dL (ref 0–99)
Total CHOL/HDL Ratio: 2.2 RATIO
Triglycerides: 48 mg/dL (ref <150)
VLDL: 10 mg/dL (ref 0–40)

## 2016-04-18 LAB — URINE CULTURE: Culture: <10000 — AB

## 2016-04-18 LAB — TROPONIN I
Troponin I: <0.03 ng/mL (ref <0.03)
Troponin I: <0.03 ng/mL (ref <0.03)

## 2016-04-18 LAB — TSH: TSH: 2.173 u[IU]/mL (ref 0.350–4.500)

## 2016-04-18 NOTE — Care Management Obs Status (Signed)
[facility] NOTIFICATION   Patient Details  Name: SHREYAS DICKHAUT MRN: [id number] Date of Birth: 12-11-1945   Medicare Observation Status Notification Given:  Yes    Dawayne Patricia, RN 04/18/2016, 11:23 AM

## 2016-04-18 NOTE — Progress Notes (Addendum)
PROGRESS NOTE    Darrell Horne  [id number] DOB: [birth year] DOA: 04/17/2016 PCP: Gildardo Cranker, DO    Brief Narrative: 70 year old admitted for syncope while eating lunch.     Assessment & Plan:   Active Problems:   PAF (paroxysmal atrial fibrillation) ([facility])   DM2 (diabetes mellitus, type 2) ([facility])   HTN (hypertension)   HLD (hyperlipidemia)   Neuropathy due to medical condition ([place])   Diabetic polyneuropathy associated with type 2 diabetes mellitus ([place])   Syncope   Syncope: suspect orthostatic but he was sitting when the event happened.  And also his bp was low on admission, which normalized on fluid bolus.  Received maintenance fluids for 12 hours.  troponins are negative.  Echocardiogram still pending. If echo is normal will get EP evaluation in am.  Get orthostatics .    Hypertension: controlled.   Diabetes mellitus.  CBG (last 3)   Recent Labs  04/18/16 0604 04/18/16 1112 04/18/16 1607  GLUCAP 102* 110* 85    hgab1c pending. Resume SSI.   Diabetic neuropathy: Resume gabapentin.   PAF: in sinus, resume eliquis   DVT prophylaxis: eliquis. Code Status: full code.  Family Communication: wife at bedside Disposition Plan: pending PT eval.    Consultants:   none   Antimicrobials: none   Subjective: Denies any chest pain or sob.   Objective: Filed Vitals:   04/17/16 1800 04/17/16 1920 04/18/16 0313 04/18/16 1422  BP: 100/62 120/64 108/61 103/59  Pulse: 64 65 67   Temp:  97.8 F (36.6 C) 97.7 F (36.5 C) 97.6 F (36.4 C)  TempSrc:  Oral Oral Oral  Resp: 17 18 16 19   Height:  5\' 10"  (1.778 m)    Weight:  124.7 kg (274 lb 14.6 oz)    SpO2: 97% 98% 100% 100%    Intake/Output Summary (Last 24 hours) at 04/18/16 1906 Last data filed at 04/18/16 1700  Gross per 24 hour  Intake    600 ml  Output   3250 ml  Net  -2650 ml   Filed Weights   04/17/16 1526 04/17/16 1920  Weight: 120.203 kg (265 lb) 124.7 kg (274 lb 14.6 oz)     Examination:  General exam: Appears calm and comfortable  Respiratory system: Clear to auscultation. Respiratory effort normal. Cardiovascular system: S1 & S2 heard, RRR. No JVD, murmurs, rubs, gallops or clicks. No pedal edema. Gastrointestinal system: Abdomen is nondistended, soft and nontender. No organomegaly or masses felt. Normal bowel sounds heard. Central nervous system: Alert and oriented. No focal neurological deficits. Extremities: Symmetric 5 x 5 power. Skin: No rashes, lesions or ulcers Psychiatry: Judgement and insight appear normal. Mood & affect appropriate.     Data Reviewed: I have personally reviewed following labs and imaging studies  CBC:  Recent Labs Lab 04/17/16 1537  WBC 7.2  HGB 11.3*  HCT 35.0*  MCV 92.3  PLT [id number]*   Basic Metabolic Panel:  Recent Labs Lab 04/17/16 1537  NA 138  K 3.5  CL 110  CO2 25  GLUCOSE 155*  BUN 9  CREATININE 1.00  CALCIUM 8.3*   GFR: Estimated Creatinine Clearance: 91.1 mL/min (by C-G formula based on Cr of 1). Liver Function Tests: No results for input(s): AST, ALT, ALKPHOS, BILITOT, PROT, ALBUMIN in the last 168 hours. No results for input(s): LIPASE, AMYLASE in the last 168 hours. No results for input(s): AMMONIA in the last 168 hours. Coagulation Profile:  Recent Labs Lab 04/17/16 1558  INR  1.53*   Cardiac Enzymes:  Recent Labs Lab 04/17/16 2005 04/18/16 0039 04/18/16 0703  TROPONINI <0.03 <0.03 <0.03   BNP (last 3 results) No results for input(s): PROBNP in the last 8760 hours. HbA1C: No results for input(s): HGBA1C in the last 72 hours. CBG:  Recent Labs Lab 04/17/16 1605 04/17/16 2208 04/18/16 0604 04/18/16 1112 04/18/16 1607  GLUCAP 114* 105* 102* 110* 85   Lipid Profile:  Recent Labs  04/18/16 0039  CHOL 105  HDL 47  LDLCALC 48  TRIG 48  CHOLHDL 2.2   Thyroid Function Tests:  Recent Labs  04/18/16 0039  TSH 2.173   Anemia Panel: No results for input(s):  VITAMINB12, FOLATE, FERRITIN, TIBC, IRON, RETICCTPCT in the last 72 hours. Sepsis Labs:  Recent Labs Lab 04/17/16 1600  LATICACIDVEN 1.08    Recent Results (from the past 240 hour(s))  Urine culture     Status: Abnormal   Collection Time: 04/17/16  6:14 PM  Result Value Ref Range Status   Specimen Description URINE, RANDOM  Final   Special Requests NONE  Final   Culture <10,000 COLONIES/mL INSIGNIFICANT GROWTH (A)  Final   Report Status 04/18/2016 FINAL  Final         Radiology Studies: Dg Chest 2 View  04/17/2016  CLINICAL DATA:  Slurred speech, hypotension EXAM: CHEST  2 VIEW COMPARISON:  07/07/2014 FINDINGS: Lungs are clear.  No pleural effusion or pneumothorax. The heart is normal in size. Mild degenerative changes of the visualized thoracolumbar spine. Cholecystectomy clips. IMPRESSION: No evidence of acute cardiopulmonary disease. Electronically Signed   By: Julian Hy M.D.   On: 04/17/2016 16:27   Ct Head Wo Contrast  04/17/2016  CLINICAL DATA:  Slurred speech, hypotensive EXAM: CT HEAD WITHOUT CONTRAST TECHNIQUE: Contiguous axial images were obtained from the base of the skull through the vertex without intravenous contrast. COMPARISON:  MRI brain dated 08/05/2015 FINDINGS: No evidence of parenchymal hemorrhage or extra-axial fluid collection. No mass lesion, mass effect, or midline shift. No CT evidence of acute infarction. Mild subcortical white matter and periventricular small vessel ischemic changes. Global cortical and central atrophy. Secondary ventricular prominence. The visualized paranasal sinuses are essentially clear. The mastoid air cells are unopacified. No evidence of calvarial fracture. IMPRESSION: No evidence of acute intracranial abnormality. Atrophy with mild small vessel ischemic changes. Electronically Signed   By: Julian Hy M.D.   On: 04/17/2016 16:41        Scheduled Meds: . amitriptyline  25 mg Oral QHS  . apixaban  5 mg Oral BID  .  atorvastatin  40 mg Oral Daily  . gabapentin  600 mg Oral TID  . insulin aspart  0-5 Units Subcutaneous QHS  . insulin aspart  0-9 Units Subcutaneous TID WC  . sodium chloride flush  3 mL Intravenous Q12H  . topiramate  25 mg Oral Daily  . vitamin C  500 mg Oral BID   Continuous Infusions:       Time spent: 25 min    Chrystle Murillo, MD [facility] Pager [phone]  If 7PM-7AM, please contact night-coverage [email or web address] Password [email or web address] 04/18/2016, 7:06 PM

## 2016-04-19 ENCOUNTER — Observation Stay (HOSPITAL_BASED_OUTPATIENT_CLINIC_OR_DEPARTMENT_OTHER): Payer: Medicare HMO

## 2016-04-19 DIAGNOSIS — R55 Syncope and collapse: Secondary | ICD-10-CM | POA: Diagnosis not present

## 2016-04-19 LAB — GLUCOSE, CAPILLARY
Glucose-Capillary: 122 mg/dL — ABNORMAL HIGH (ref 65–99)
Glucose-Capillary: 120 mg/dL — ABNORMAL HIGH (ref 65–99)
Glucose-Capillary: 95 mg/dL (ref 65–99)

## 2016-04-19 LAB — HEMOGLOBIN A1C
Hgb A1c MFr Bld: 6.2 % — ABNORMAL HIGH (ref 4.8–5.6)
Mean Plasma Glucose: 131 mg/dL

## 2016-04-19 LAB — ECHOCARDIOGRAM COMPLETE
Height: 70 in
Weight: 4398.62 oz

## 2016-04-19 MED ORDER — METOPROLOL SUCCINATE ER 25 MG PO TB24: 12.5000 mg | ORAL_TABLET | Freq: Every day | ORAL | Status: DC

## 2016-04-19 NOTE — Progress Notes (Signed)
Echocardiogram 2D Echocardiogram has been performed.  Darrell Horne 04/19/2016, 1:50 PM

## 2016-04-26 NOTE — Discharge Summary (Addendum)
Physician Discharge Summary  Darrell Horne [id number] DOB: [birth year] DOA: 04/17/2016  PCP: Gildardo Cranker, DO  Admit date: 04/17/2016 Discharge date: 04/19/2016  Admitted From: [facility] Disposition:  home  Recommendations for Outpatient Follow-up:  1. Follow up with PCP in 1-2 weeks 2. Please obtain BMP/CBC in one week 3. pleae follow up with cardiology for event monitor placement.     Discharge Condition:stable. CODE STATUS: FULL, Diet recommendation: Heart Healthy   Brief/Interim Summary: 71 year old admitted for syncope while eating lunch.   Discharge Diagnoses:  Active Problems:   PAF (paroxysmal atrial fibrillation) (HCC)   DM2 (diabetes mellitus, type 2) (HCC)   HTN (hypertension)   HLD (hyperlipidemia)   Neuropathy due to medical condition ([place])   Diabetic polyneuropathy associated with type 2 diabetes mellitus ([place])   Syncope  Syncope: suspect orthostatic but he was sitting when the event happened.  And also his bp was low on admission, which normalized on fluid bolus.  Received maintenance fluids for 12 hours.  troponins are negative.  Echocardiogram unremarkable.  Orthostatics negative.  Suggested EP evaluation, but pt wanted to leave and get further work up as outpatient.  Called cardiology for event monitor placement and discussed with the patient .  Decreased the dose of metoprolol on discharge.    Hypertension: controlled.   Diabetes mellitus.  CBG (last 3)   Recent Labs (last 2 labs)      Recent Labs  04/18/16 0604 04/18/16 1112 04/18/16 1607  GLUCAP 102* 110* 85      Resume home medications ondischarge.   Diabetic neuropathy: Resume gabapentin.   PAF: in sinus, resume eliquis        Discharge Instructions  Discharge Instructions    Diet - low sodium heart healthy    Complete by:  As directed      Discharge instructions    Complete by:  As directed   Please follow up with cardiology for event monitor placement.   Please follow up with PCP in one week ,post hospitalization visit.            Medication List    TAKE these medications        amitriptyline 25 MG tablet  Commonly known as:  ELAVIL  Take 25 mg by mouth at bedtime.     apixaban 5 MG Tabs tablet  Commonly known as:  ELIQUIS  Take 1 tablet (5 mg total) by mouth 2 (two) times daily.     atorvastatin 40 MG tablet  Commonly known as:  LIPITOR  Take 1 tablet (40 mg total) by mouth daily.     b complex vitamins tablet  Take 1 tablet by mouth daily.     gabapentin 600 MG tablet  Commonly known as:  NEURONTIN  Take 1 tablet (600 mg total) by mouth 3 (three) times daily.     metFORMIN 500 MG 24 hr tablet  Commonly known as:  GLUCOPHAGE-XR  Take 1 tablet (500 mg total) by mouth daily with supper.     metoprolol succinate 25 MG 24 hr tablet  Commonly known as:  TOPROL XL  Take 0.5 tablets (12.5 mg total) by mouth daily.     SYSTANE OP  Place 1 drop into both eyes 2 (two) times daily as needed (dry eyes/ burning).     tiZANidine 4 MG tablet  Commonly known as:  ZANAFLEX  Take 1 tablet (4 mg total) by mouth every 6 (six) hours as needed for muscle spasms.  topiramate 25 MG tablet  Commonly known as:  TOPAMAX  Take 1 tablet (25 mg total) by mouth daily.     vitamin C 500 MG tablet  Commonly known as:  ASCORBIC ACID  Take 500 mg by mouth 2 (two) times daily.     VITAMIN D PO  Take 1 tablet by mouth 2 (two) times daily.           Follow-up Information    Follow up with Gildardo Cranker, DO. Schedule an appointment as soon as possible for a visit in 1 week.   Specialty:  Internal Medicine   Contact information:   [place] [id number] [phone]      No Known Allergies  Consultations:  Called cardiology for event monitor placement and discussedw ith the patient.    Procedures/Studies: Dg Chest 2 View  04/17/2016  CLINICAL DATA:  Slurred speech, hypotension EXAM: CHEST  2 VIEW  COMPARISON:  07/07/2014 FINDINGS: Lungs are clear.  No pleural effusion or pneumothorax. The heart is normal in size. Mild degenerative changes of the visualized thoracolumbar spine. Cholecystectomy clips. IMPRESSION: No evidence of acute cardiopulmonary disease. Electronically Signed   By: Julian Hy M.D.   On: 04/17/2016 16:27   Ct Head Wo Contrast  04/17/2016  CLINICAL DATA:  Slurred speech, hypotensive EXAM: CT HEAD WITHOUT CONTRAST TECHNIQUE: Contiguous axial images were obtained from the base of the skull through the vertex without intravenous contrast. COMPARISON:  MRI brain dated 08/05/2015 FINDINGS: No evidence of parenchymal hemorrhage or extra-axial fluid collection. No mass lesion, mass effect, or midline shift. No CT evidence of acute infarction. Mild subcortical white matter and periventricular small vessel ischemic changes. Global cortical and central atrophy. Secondary ventricular prominence. The visualized paranasal sinuses are essentially clear. The mastoid air cells are unopacified. No evidence of calvarial fracture. IMPRESSION: No evidence of acute intracranial abnormality. Atrophy with mild small vessel ischemic changes. Electronically Signed   By: Julian Hy M.D.   On: 04/17/2016 16:41       Subjective: No new complaints.   Discharge Exam: Filed Vitals:   04/18/16 2122 04/19/16 1511  BP: 111/58 101/60  Pulse: 75 69  Temp: 98 F (36.7 C) 97.9 F (36.6 C)  Resp: 18 18   Filed Vitals:   04/18/16 0313 04/18/16 1422 04/18/16 2122 04/19/16 1511  BP: 108/61 103/59 111/58 101/60  Pulse: 67  75 69  Temp: 97.7 F (36.5 C) 97.6 F (36.4 C) 98 F (36.7 C) 97.9 F (36.6 C)  TempSrc: Oral Oral Oral Oral  Resp: 16 19 18 18   Height:      Weight:      SpO2: 100% 100% 100% 100%    General: Pt is alert, awake, not in acute distress Cardiovascular: RRR, S1/S2 +, no rubs, no gallops Respiratory: CTA bilaterally, no wheezing, no rhonchi Abdominal: Soft, NT, ND,  bowel sounds + Extremities: no edema, no cyanosis    The results of significant diagnostics from this hospitalization (including imaging, microbiology, ancillary and laboratory) are listed below for reference.     Microbiology: Recent Results (from the past 240 hour(s))  Urine culture     Status: Abnormal   Collection Time: 04/17/16  6:14 PM  Result Value Ref Range Status   Specimen Description URINE, RANDOM  Final   Special Requests NONE  Final   Culture <10,000 COLONIES/mL INSIGNIFICANT GROWTH (A)  Final   Report Status 04/18/2016 FINAL  Final     Labs: BNP (last 3  results)  Recent Labs  04/17/16 1537  BNP 0000000*   Basic Metabolic Panel: No results for input(s): NA, K, CL, CO2, GLUCOSE, BUN, CREATININE, CALCIUM, MG, PHOS in the last 168 hours. Liver Function Tests: No results for input(s): AST, ALT, ALKPHOS, BILITOT, PROT, ALBUMIN in the last 168 hours. No results for input(s): LIPASE, AMYLASE in the last 168 hours. No results for input(s): AMMONIA in the last 168 hours. CBC: No results for input(s): WBC, NEUTROABS, HGB, HCT, MCV, PLT in the last 168 hours. Cardiac Enzymes: No results for input(s): CKTOTAL, CKMB, CKMBINDEX, TROPONINI in the last 168 hours. BNP: Invalid input(s): POCBNP CBG: No results for input(s): GLUCAP in the last 168 hours. D-Dimer No results for input(s): DDIMER in the last 72 hours. Hgb A1c No results for input(s): HGBA1C in the last 72 hours. Lipid Profile No results for input(s): CHOL, HDL, LDLCALC, TRIG, CHOLHDL, LDLDIRECT in the last 72 hours. Thyroid function studies No results for input(s): TSH, T4TOTAL, T3FREE, THYROIDAB in the last 72 hours.  Invalid input(s): FREET3 Anemia work up No results for input(s): VITAMINB12, FOLATE, FERRITIN, TIBC, IRON, RETICCTPCT in the last 72 hours. Urinalysis    Component Value Date/Time   COLORURINE YELLOW 04/17/2016 1814   APPEARANCEUR CLOUDY* 04/17/2016 1814   LABSPEC 1.015 04/17/2016 1814    PHURINE 7.0 04/17/2016 1814   GLUCOSEU NEGATIVE 04/17/2016 1814   HGBUR NEGATIVE 04/17/2016 1814   BILIRUBINUR NEGATIVE 04/17/2016 1814   KETONESUR NEGATIVE 04/17/2016 1814   PROTEINUR NEGATIVE 04/17/2016 1814   UROBILINOGEN 0.2 07/08/2014 0137   NITRITE NEGATIVE 04/17/2016 1814   LEUKOCYTESUR NEGATIVE 04/17/2016 1814   Sepsis Labs Invalid input(s): PROCALCITONIN,  WBC,  LACTICIDVEN Microbiology Recent Results (from the past 240 hour(s))  Urine culture     Status: Abnormal   Collection Time: 04/17/16  6:14 PM  Result Value Ref Range Status   Specimen Description URINE, RANDOM  Final   Special Requests NONE  Final   Culture <10,000 COLONIES/mL INSIGNIFICANT GROWTH (A)  Final   Report Status 04/18/2016 FINAL  Final     Time coordinating discharge: Over 30 minutes  SIGNED:   Hosie Poisson, MD  [facility] 04/26/2016, 10:35 PM Pager   If 7PM-7AM, please contact night-coverage [email or web address] Password [email or web address]

## 2016-05-06 ENCOUNTER — Encounter: Payer: Self-pay | Admitting: Internal Medicine

## 2016-05-06 ENCOUNTER — Ambulatory Visit (INDEPENDENT_AMBULATORY_CARE_PROVIDER_SITE_OTHER): Payer: Medicare HMO | Admitting: Internal Medicine

## 2016-05-06 VITALS — BP 110/72 | HR 73 | Temp 98.4°F | Ht 70.0 in | Wt 267.2 lb

## 2016-05-06 DIAGNOSIS — R55 Syncope and collapse: Secondary | ICD-10-CM

## 2016-05-06 DIAGNOSIS — I480 Paroxysmal atrial fibrillation: Secondary | ICD-10-CM

## 2016-05-06 DIAGNOSIS — E1142 Type 2 diabetes mellitus with diabetic polyneuropathy: Secondary | ICD-10-CM | POA: Diagnosis not present

## 2016-05-06 DIAGNOSIS — I10 Essential (primary) hypertension: Secondary | ICD-10-CM | POA: Diagnosis not present

## 2016-05-06 NOTE — Patient Instructions (Signed)
Follow up with Dr Oval Linsey to continue to work up syncope'  Continue current medications as ordered  Follow up as scheduled in Oct 2017

## 2016-05-06 NOTE — Progress Notes (Signed)
Patient ID: Darrell Horne, male   DOB: 29-Nov-1945, 70 y.o.   MRN: [id number]    Location:  [facility] Place of Service: OFFICE  Chief Complaint  Patient presents with  . Follow-up    hospital follow up    HPI:  70 yo male seen today for hospital f/u. He was dx with syncope likely due to orthostasis while eating. BP improved with IVF. Orthostatics in the hospital was negative. 2D echo was neg. He has not made appt to see cardiology yet for event monitor. He does have a hx PAF  DM2 - controlled on metformin. Has neuropathy that is uncontrolled and takes gabapentin 1217m per day and elavil. 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 He wears a prosthesis. Uses a cane most times but does have a walker when anticipating prolonged standing/walking. Followed by Endo Dr ELoanne Drilling CBGs in 140s. No low BS reactions. He saw podiatry in Feb 2017 and nails debrided. A1c 6.2%. Last eye exam by Dr RZadie Rhinein June 2017 showed no diabetic eye changes. Urine micro/Cr ratio <5.8.  HTN - BP controlled on metoprolol  Hyperlipidemia - stable on lipitor  PAF - rate controlled on metoprolol. Takes eliquis for anticoagulation. Followed by cardio Dr ROval Linsey Underwent lexiscan myoview in 06/2015 which was nml. 2D echo showed moderate diastolic dysfunction with nml EF. Last ECG in 06/2015 showed 1st degree AVB and LBBB. Previous cardio Dr at [facility] Occasional palpitations  HA - stable on topamax. Chronic intractable. Followed by neurology Dr PPosey Pronto He was supposed to stop elavil but resumed it as his neuropathy worsened.   Insomnia - stable on elavil  Chronic LBP - pain now stable with prn Norco. Followed by Ortho Dr DSharol Given Of note, he underwent left rotator cuff repair in Dec 2016.    Past Medical History:  Diagnosis Date  . Chest pain   . Diabetes mellitus   . DM2 (diabetes mellitus, type 2) ([place]   . GERD (gastroesophageal reflux disease)   . HLD (hyperlipidemia)   . HTN (hypertension)   . PAF  (paroxysmal atrial fibrillation) ([place]   . Palpitations     Past Surgical History:  Procedure Laterality Date  . APPENDECTOMY    . CHOLECYSTECTOMY    . COLONOSCOPY     Dr. SMichail Sermon . HERNIA REPAIR    . LEG AMPUTATION Left [id number]  Dr. RKendell Bane . SHOULDER SURGERY  [id number]  Dr. MMeridee Score . TOTAL ANKLE ARTHROPLASTY  [id number]   Patient Care Team: MGildardo Cranker DO as PCP - General (Internal Medicine)  Social History   Social History  . Marital status: Divorced    Spouse name: N/A  . Number of children: 4  . Years of education: N/A   Occupational History  . retired tAdministrator   Social History Main Topics  . Smoking status: Never Smoker  . Smokeless tobacco: Never Used  . Alcohol use Yes     Comment: Previously drinking a fifth of liquor every two days x 13 years  . Drug use: No  . Sexual activity: Not on file   Other Topics Concern  . Not on file   Social History Narrative   Diet:   Do you drink/eat things with caffeine?  Yes   Marital status:  Divorced.  What year were you married?  2001   Do you live in a house, assisted living, condo, apartment, trailer, etc.?  Apartment   Is it one  or more stories?  1 story   How many persons live in your home?  1   Do you have any pets in your home?  No   Current or past profession:  Truck driver   Do you exercise?  Some  Type and how often:  Walking     reports that he has never smoked. He has never used smokeless tobacco. He reports that he drinks alcohol. He reports that he does not use drugs.  Family History  Problem Relation Age of Onset  . Heart disease Mother     Living  . Stroke Father 16    Deceased  . Diabetes Sister    Family Status  Relation Status  . Mother Alive  . Father Deceased  . Sister Alive  . Brother Alive  . Daughter Alive  . Daughter Alive  . Daughter Alive  . Son Alive  . Sister Alive  . Sister Alive  . Sister Alive     No Known  Allergies  Medications: Patient's Medications  New Prescriptions   No medications on file  Previous Medications   AMITRIPTYLINE (ELAVIL) 25 MG TABLET    Take 25 mg by mouth at bedtime.   APIXABAN (ELIQUIS) 5 MG TABS TABLET    Take 1 tablet (5 mg total) by mouth 2 (two) times daily.   ATORVASTATIN (LIPITOR) 40 MG TABLET    Take 1 tablet (40 mg total) by mouth daily.   B COMPLEX VITAMINS TABLET    Take 1 tablet by mouth daily.   CHOLECALCIFEROL (VITAMIN D PO)    Take 1 tablet by mouth 2 (two) times daily.   GABAPENTIN (NEURONTIN) 600 MG TABLET    Take 1 tablet (600 mg total) by mouth 3 (three) times daily.   METFORMIN (GLUCOPHAGE-XR) 500 MG 24 HR TABLET    Take 1 tablet (500 mg total) by mouth daily with supper.   METOPROLOL SUCCINATE (TOPROL XL) 25 MG 24 HR TABLET    Take 0.5 tablets (12.5 mg total) by mouth daily.   POLYETHYL GLYCOL-PROPYL GLYCOL (SYSTANE OP)    Place 1 drop into both eyes 2 (two) times daily as needed (dry eyes/ burning).   TIZANIDINE (ZANAFLEX) 4 MG TABLET    Take 1 tablet (4 mg total) by mouth every 6 (six) hours as needed for muscle spasms.   TOPIRAMATE (TOPAMAX) 25 MG TABLET    Take 1 tablet (25 mg total) by mouth daily.   VITAMIN C (ASCORBIC ACID) 500 MG TABLET    Take 500 mg by mouth 2 (two) times daily.   Modified Medications   No medications on file  Discontinued Medications   No medications on file    Review of Systems  Cardiovascular: Positive for palpitations.  Musculoskeletal: Positive for arthralgias, back pain, gait problem and myalgias.  Psychiatric/Behavioral: Positive for sleep disturbance.  All other systems reviewed and are negative.   Vitals:   05/06/16 1604  BP: 110/72  Pulse: 73  Temp: 98.4 F (36.9 C)  TempSrc: Oral  SpO2: 97%  Weight: 267 lb 3.2 oz (121.2 kg)  Height: 5' 10"  (1.778 m)   Body mass index is 38.34 kg/m.  Physical Exam  Constitutional: He appears well-developed and well-nourished.  HENT:  Mouth/Throat: Oropharynx  is clear and moist.  Eyes: Pupils are equal, round, and reactive to light. No scleral icterus.  Neck: Neck supple. Carotid bruit is not present.  Cardiovascular: Normal rate and intact distal pulses.  An irregularly irregular rhythm present.  Exam reveals no gallop and no friction rub.   Murmur heard.  Systolic murmur is present with a grade of 1/6  Pulses:      Left dorsalis pedis pulse not accessible.       Left posterior tibial pulse not accessible.  Left BKA. No RLE edema or calf TTP  Pulmonary/Chest: Effort normal and breath sounds normal. He has no wheezes. He has no rales. He exhibits no tenderness.  Abdominal: Soft. Bowel sounds are normal. He exhibits no distension, no abdominal bruit, no pulsatile midline mass and no mass. There is no tenderness. There is no rebound and no guarding.  Musculoskeletal: He exhibits edema and tenderness.       Back:  Left BKA  Lymphadenopathy:    He has no cervical adenopathy.  Neurological: He is alert.  Skin: Skin is warm and dry. No rash noted.  Psychiatric: He has a normal mood and affect. His behavior is normal.     Labs reviewed: Admission on 04/17/2016, Discharged on 04/19/2016  Component Date Value Ref Range Status  . Sodium 04/17/2016 138  135 - 145 mmol/L Final  . Potassium 04/17/2016 3.5  3.5 - 5.1 mmol/L Final  . Chloride 04/17/2016 110  101 - 111 mmol/L Final  . CO2 04/17/2016 25  22 - 32 mmol/L Final  . Glucose, Bld 04/17/2016 155* 65 - 99 mg/dL Final  . BUN 04/17/2016 9  6 - 20 mg/dL Final  . Creatinine, Ser 04/17/2016 1.00  0.61 - 1.24 mg/dL Final  . Calcium 04/17/2016 8.3* 8.9 - 10.3 mg/dL Final  . GFR calc non Af Amer 04/17/2016 >60  >60 mL/min Final  . GFR calc Af Amer 04/17/2016 >60  >60 mL/min Final   Comment: (NOTE) The eGFR has been calculated using the CKD EPI equation. This calculation has not been validated in all clinical situations. eGFR's persistently <60 mL/min signify possible Chronic Kidney Disease.   .  Anion gap 04/17/2016 3* 5 - 15 Final  . WBC 04/17/2016 7.2  4.0 - 10.5 K/uL Final  . RBC 04/17/2016 3.79* 4.22 - 5.81 MIL/uL Final  . Hemoglobin 04/17/2016 11.3* 13.0 - 17.0 g/dL Final  . HCT 04/17/2016 35.0* 39.0 - 52.0 % Final  . MCV 04/17/2016 92.3  78.0 - 100.0 fL Final  . MCH 04/17/2016 29.8  26.0 - 34.0 pg Final  . MCHC 04/17/2016 32.3  30.0 - 36.0 g/dL Final  . RDW 04/17/2016 14.7  11.5 - 15.5 % Final  . Platelets 04/17/2016 142* 150 - 400 K/uL Final  . Glucose-Capillary 04/17/2016 114* 65 - 99 mg/dL Final  . Comment 1 04/17/2016 Notify RN   Final  . Comment 2 04/17/2016 Document in Chart   Final  . Troponin i, poc 04/17/2016 0.00  0.00 - 0.08 ng/mL Final  . Comment 3 04/17/2016          Final   Comment: Due to the release kinetics of cTnI, a negative result within the first hours of the onset of symptoms does not rule out myocardial infarction with certainty. If myocardial infarction is still suspected, repeat the test at appropriate intervals.   . B Natriuretic Peptide 04/17/2016 202.1* 0.0 - 100.0 pg/mL Final  . Prothrombin Time 04/17/2016 18.5* 11.6 - 15.2 seconds Final  . INR 04/17/2016 1.53* 0.00 - 1.49 Final  . Lactic Acid, Venous 04/17/2016 1.08  0.5 - 1.9 mmol/L Final  . Color, Urine 04/17/2016 YELLOW  YELLOW Final  . APPearance 04/17/2016 CLOUDY* CLEAR Final  .  Specific Gravity, Urine 04/17/2016 1.015  1.005 - 1.030 Final  . pH 04/17/2016 7.0  5.0 - 8.0 Final  . Glucose, UA 04/17/2016 NEGATIVE  NEGATIVE mg/dL Final  . Hgb urine dipstick 04/17/2016 NEGATIVE  NEGATIVE Final  . Bilirubin Urine 04/17/2016 NEGATIVE  NEGATIVE Final  . Ketones, ur 04/17/2016 NEGATIVE  NEGATIVE mg/dL Final  . Protein, ur 04/17/2016 NEGATIVE  NEGATIVE mg/dL Final  . Nitrite 04/17/2016 NEGATIVE  NEGATIVE Final  . Leukocytes, UA 04/17/2016 NEGATIVE  NEGATIVE Final  . Specimen Description 04/18/2016 URINE, RANDOM   Final  . Special Requests 04/18/2016 NONE   Final  . Culture 04/18/2016  <10,000 COLONIES/mL INSIGNIFICANT GROWTH*  Final  . Report Status 04/18/2016 04/18/2016 FINAL   Final  . Troponin I 04/17/2016 <0.03  <0.03 ng/mL Final  . Troponin I 04/18/2016 <0.03  <0.03 ng/mL Final  . Troponin I 04/18/2016 <0.03  <0.03 ng/mL Final  . Weight 04/19/2016 4398.62  oz Final  . Height 04/19/2016 70  in Final  . BP 04/19/2016 111/58  mmHg Final  . Cholesterol 04/18/2016 105  0 - 200 mg/dL Final  . Triglycerides 04/18/2016 48  <150 mg/dL Final  . HDL 04/18/2016 47  >40 mg/dL Final  . Total CHOL/HDL Ratio 04/18/2016 2.2  RATIO Final  . VLDL 04/18/2016 10  0 - 40 mg/dL Final  . LDL Cholesterol 04/18/2016 48  0 - 99 mg/dL Final   Comment:        Total Cholesterol/HDL:CHD Risk Coronary Heart Disease Risk Table                     Men   Women  1/2 Average Risk   3.4   3.3  Average Risk       5.0   4.4  2 X Average Risk   9.6   7.1  3 X Average Risk  23.4   11.0        Use the calculated Patient Ratio above and the CHD Risk Table to determine the patient's CHD Risk.        ATP III CLASSIFICATION (LDL):  <100     mg/dL   Optimal  100-129  mg/dL   Near or Above                    Optimal  130-159  mg/dL   Borderline  160-189  mg/dL   High  >190     mg/dL   Very High   . Hgb A1c MFr Bld 04/19/2016 6.2* 4.8 - 5.6 % Final   Comment: (NOTE)         Pre-diabetes: 5.7 - 6.4         Diabetes: >6.4         Glycemic control for adults with diabetes: <7.0   . Mean Plasma Glucose 04/19/2016 131  mg/dL Final   Comment: (NOTE) Performed At: [facility] [place], [place] [id number] Lindon Romp MD [id number]   . TSH 04/18/2016 2.173  0.350 - 4.500 uIU/mL Final  . Glucose-Capillary 04/17/2016 105* 65 - 99 mg/dL Final  . Comment 1 04/17/2016 Notify RN   Final  . Comment 2 04/17/2016 Document in Chart   Final  . Glucose-Capillary 04/18/2016 102* 65 - 99 mg/dL Final  . Glucose-Capillary 04/18/2016 110* 65 - 99 mg/dL Final  . Comment 1 04/18/2016  Notify RN   Final  . Glucose-Capillary 04/18/2016 85  65 - 99 mg/dL Final  .  Comment 1 04/18/2016 Notify RN   Final  . Glucose-Capillary 04/18/2016 132* 65 - 99 mg/dL Final  . Glucose-Capillary 04/19/2016 122* 65 - 99 mg/dL Final  . Glucose-Capillary 04/19/2016 120* 65 - 99 mg/dL Final  . Comment 1 04/19/2016 Notify RN   Final  . Comment 2 04/19/2016 Document in Chart   Final  . Glucose-Capillary 04/19/2016 95  65 - 99 mg/dL Final  . Comment 1 04/19/2016 Notify RN   Final  . Comment 2 04/19/2016 Document in Chart   Final  Abstract on 03/28/2016  Component Date Value Ref Range Status  . HM Diabetic Eye Exam 03/22/2016 No Retinopathy  No Retinopathy Final    Dg Chest 2 View  Result Date: 04/17/2016 CLINICAL DATA:  Slurred speech, hypotension EXAM: CHEST  2 VIEW COMPARISON:  07/07/2014 FINDINGS: Lungs are clear.  No pleural effusion or pneumothorax. The heart is normal in size. Mild degenerative changes of the visualized thoracolumbar spine. Cholecystectomy clips. IMPRESSION: No evidence of acute cardiopulmonary disease. Electronically Signed   By: Julian Hy M.D.   On: 04/17/2016 16:27   Ct Head Wo Contrast  Result Date: 04/17/2016 CLINICAL DATA:  Slurred speech, hypotensive EXAM: CT HEAD WITHOUT CONTRAST TECHNIQUE: Contiguous axial images were obtained from the base of the skull through the vertex without intravenous contrast. COMPARISON:  MRI brain dated 08/05/2015 FINDINGS: No evidence of parenchymal hemorrhage or extra-axial fluid collection. No mass lesion, mass effect, or midline shift. No CT evidence of acute infarction. Mild subcortical white matter and periventricular small vessel ischemic changes. Global cortical and central atrophy. Secondary ventricular prominence. The visualized paranasal sinuses are essentially clear. The mastoid air cells are unopacified. No evidence of calvarial fracture. IMPRESSION: No evidence of acute intracranial abnormality. Atrophy with mild small  vessel ischemic changes. Electronically Signed   By: Julian Hy M.D.   On: 04/17/2016 16:41     Assessment/Plan   ICD-9-CM ICD-10-CM   1. Syncope and collapse - no further events 780.2 R55   2. PAF (paroxysmal atrial fibrillation) (HCC) 427.31 I48.0   3. Type 2 diabetes mellitus with diabetic polyneuropathy, without long-term current use of insulin (HCC) 250.60 E11.42    357.2    4. Essential hypertension 401.9 I10     Follow up with Dr Oval Linsey to continue to work up syncope. Highly recommend he follows through with event monitor  Continue current medications as ordered  F/u with specialists as scheduled  Follow up as scheduled in Oct 2017   [place]. Perlie Gold  [facility] [address] [place], [place] [address] [phone] Cell (Monday-Friday 8 AM - 5 PM) [phone] After 5 PM and follow prompts

## 2016-05-09 ENCOUNTER — Other Ambulatory Visit: Payer: Self-pay | Admitting: *Deleted

## 2016-05-09 MED ORDER — APIXABAN 5 MG PO TABS: 5.0000 mg | ORAL_TABLET | Freq: Two times a day (BID) | ORAL | 1 refills | Status: DC

## 2016-05-09 NOTE — Telephone Encounter (Signed)
Patient requested refill to be sent to [facility]  

## 2016-06-01 DIAGNOSIS — E119 Type 2 diabetes mellitus without complications: Secondary | ICD-10-CM | POA: Diagnosis not present

## 2016-06-06 ENCOUNTER — Ambulatory Visit: Payer: Medicare HMO | Admitting: Sports Medicine

## 2016-06-27 ENCOUNTER — Encounter: Payer: Self-pay | Admitting: Neurology

## 2016-06-27 ENCOUNTER — Ambulatory Visit (INDEPENDENT_AMBULATORY_CARE_PROVIDER_SITE_OTHER): Payer: Medicare HMO | Admitting: Neurology

## 2016-06-27 VITALS — BP 108/68 | HR 83 | Ht 70.0 in | Wt 268.2 lb

## 2016-06-27 DIAGNOSIS — G621 Alcoholic polyneuropathy: Secondary | ICD-10-CM

## 2016-06-27 DIAGNOSIS — R51 Headache: Secondary | ICD-10-CM | POA: Diagnosis not present

## 2016-06-27 DIAGNOSIS — E1142 Type 2 diabetes mellitus with diabetic polyneuropathy: Secondary | ICD-10-CM

## 2016-06-27 DIAGNOSIS — R69 Illness, unspecified: Secondary | ICD-10-CM | POA: Diagnosis not present

## 2016-06-27 DIAGNOSIS — R519 Headache, unspecified: Secondary | ICD-10-CM

## 2016-06-27 MED ORDER — AMITRIPTYLINE HCL 50 MG PO TABS: 50.0000 mg | ORAL_TABLET | Freq: Every day | ORAL | 3 refills | Status: DC

## 2016-06-27 NOTE — Progress Notes (Signed)
Follow-up Visit   Date: 06/27/16    Darrell Horne MRN: [id number] DOB: 24-Feb-1946   Interim History: Darrell Horne is a 70 y.o. right-handed Caucasian male with diabetes mellitus type 2 s/p left BKA (2013), chronic low back pain, hypertension, paroxsymal atrial fibrillation (on Eliquis), and hyperlipidemia returning to the clinic for follow-up of neuropathy and headaches.  The patient was accompanied to the clinic by friend who also provides collateral information.    History of present illness: Beginning around late 1990s, he started having numbness and tingling of the feet which has progressed over the years. He underwent left BKA in 2013. Currently, he has numbness of the right foot and sharp pain involving the toes. Around 2013, he began noticing numbness of the finger tips. He is dropping objects because he cannot feel things. He denies any weakness of the hands. He does not have a lot of tingling in the hands or feet. He is taking gabapentin 618m TID and amitriptyline 526mqhs. Nothing improves or exacerbates his pain. He has a 15 year history of alcoholism, but has been sober since 2013.   Three weeks ago, he reports falling off the bed and hit his head on the nightstand. There was no loss consciousness. Since then, he complains of dull right parietal headache which radiates down his neck and back. Pain lasts about 68m78m- 30 minutes and occurs daily. He has tried tylenol but it does not provide relief. No associated vision change, facial weakness, or dizziness.   He also has chronic low back pain and had a MRI lumbar spine ordered by his orthopeadic physican.    UPDATE 07/16/2015:   Headaches have become worsen and variable in location and did not respond to increasing amitriptyline to 768m62mHe is not taking anything over the counter for the pain.  He endorses lacrimation of the left nares, but has worse headaches on the right side.   UPDATE 12/23/2015:  He reports that  headaches have resolved since taking topamax 268mg18m amitriptyline 50mg 49medtime.  Last headache was about 3 weeks ago and much milder than previously.  He has also noticed that his paresthesias are improved on topamax.  He and his wife are planning on joining [employer]. [facility] one fall yesterday due to imbalance. He uses a cane and wheelchair to ambulate.  UPDATE 06/27/2016:  He was hospitalized in July for syncopal event while eating lunch, which was thought to be due to orthostasis as his BP was low on admission.  There was no seizure-like activity. He was recommended to have EP evaluation, but he decided to do additional work-up as an out-patient, but has not scheduled this.  His beta blocker was reduced and he will be having cardiac monitoring.  My last note indicates that he was on amitriptyline 50mg q22mbut he reports taking 268mg qh46mHe has noticed worsening headaches since reducing his amitriptyline to 268mg, bu33m unclear why the dose was reduced from 50mg qhs.41ms last QTc 423.   Medications:  Current Outpatient Prescriptions on File Prior to Visit  Medication Sig Dispense Refill  . apixaban (ELIQUIS) 5 MG TABS tablet Take 1 tablet (5 mg total) by mouth 2 (two) times daily. 60 tablet 1  . atorvastatin (LIPITOR) 40 MG tablet Take 1 tablet (40 mg total) by mouth daily. 90 tablet 1  . b complex vitamins tablet Take 1 tablet by mouth daily.    . Cholecalciferol (VITAMIN D  PO) Take 1 tablet by mouth 2 (two) times daily.    Marland Kitchen gabapentin (NEURONTIN) 600 MG tablet Take 1 tablet (600 mg total) by mouth 3 (three) times daily. 270 tablet 3  . metFORMIN (GLUCOPHAGE-XR) 500 MG 24 hr tablet Take 1 tablet (500 mg total) by mouth daily with supper. 90 tablet 1  . metoprolol succinate (TOPROL XL) 25 MG 24 hr tablet Take 0.5 tablets (12.5 mg total) by mouth daily. 30 tablet 0  . Polyethyl Glycol-Propyl Glycol (SYSTANE OP) Place 1 drop into both eyes 2 (two) times daily as needed (dry eyes/ burning).    Marland Kitchen  tiZANidine (ZANAFLEX) 4 MG tablet Take 1 tablet (4 mg total) by mouth every 6 (six) hours as needed for muscle spasms. 30 tablet 1  . topiramate (TOPAMAX) 25 MG tablet Take 1 tablet (25 mg total) by mouth daily. 90 tablet 1  . vitamin C (ASCORBIC ACID) 500 MG tablet Take 500 mg by mouth 2 (two) times daily.      No current facility-administered medications on file prior to visit.     Allergies: No Known Allergies  Review of Systems:  CONSTITUTIONAL: No fevers, chills, night sweats, or weight loss.  EYES: No visual changes or eye pain ENT: No hearing changes.  No history of nose bleeds.   RESPIRATORY: No cough, wheezing and shortness of breath.   CARDIOVASCULAR: Negative for chest pain, and palpitations.   GI: Negative for abdominal discomfort, blood in stools or black stools.  No recent change in bowel habits.   GU:  No history of incontinence.   MUSCLOSKELETAL: No history of joint pain or swelling.  No myalgias.   SKIN: Negative for lesions, rash, and itching.   ENDOCRINE: Negative for cold or heat intolerance, polydipsia or goiter.   PSYCH:  No depression or anxiety symptoms.   NEURO: As Above.   Vital Signs:  BP 108/68   Pulse 83   Ht 5' 10"  (1.778 m)   Wt 268 lb 4 oz (121.7 kg)   SpO2 100%   BMI 38.49 kg/m   Neurological Exam: MENTAL STATUS including orientation to time, place, person, recent and remote memory, attention span and concentration, language, and fund of knowledge is normal.  Speech is not dysarthric.  CRANIAL NERVES: Pupils equal round and reactive to light.  Normal conjugate, extra-ocular eye movements in all directions of gaze.  No ptosis. Normal facial sensation.  Face is symmetric. Palate elevates symmetrically.  Tongue is midline.  MOTOR:  Left BKA.  Motor strength is 5/5 upper extremities including hip flexors, except 4+/5 intrinsic hand muscles.    MSRs:  Reflexes are 1+/4 in upper extremities and absent in the leg.  SENSORY:  Reduced sensation in a  glove-stocking distribution.  COORDINATION/GAIT:  Finger to nose testing intact.  It takes him many attempts to stand up even with using his arms. Wide-based, moderately ataxic gait.  Data: Lab Results  Component Value Date   HGBA1C 6.2 (H) 04/18/2016   Labs 07/16/2015:  Vitamin B12 618, ESR 21, CRP 1.2, vitamin B1, copper 116  MRI/A brain 08/05/2015: 1. No intracranial explanation for headache.  No change since 2011. 2. Multi focal intracranial atherosclerosis as described above. The most proximal high-grade stenosis is at the left A1 segment. 3. No visualized carotid or vertebral artery stenosis in the neck.  IMPRESSION/PLAN: 1. Chronic tension headaches  - Increase amitriptyline to 27m qhs  - Continue topirmate 229mqhs, increase this to 5051mhs going forward, if needed  2.  Peripheral neuropathy due to diabetes and alcoholism (sober since 2013) affecting glove-stocking distribution - Risk factors: Diabetes and significant alcohol history - Symptoms manifesting predominately with numbness and explained that medications are most helpful with painful paresthesias - Encouraged him to maintain tight glycemic control - Unable to increase gabapentin due to him feeling sleepy on 637m TID  3.  Multifactorial gait abnormality due to neuropathy, low back pain, and left BKA  4.  Syncopal spell in July 2017, follow-up with cardiology.  No evidence of seizure-like activity.    Return to clinic in 6 months   The duration of this appointment visit was 25 minutes of face-to-face time with the patient.  Greater than 50% of this time was spent in counseling, explanation of diagnosis, planning of further management, and coordination of care.   Thank you for allowing me to participate in patient's care.  If I can answer any additional questions, I would be pleased to do so.    Sincerely,    Donika K. PPosey Pronto DO

## 2016-06-27 NOTE — Patient Instructions (Signed)
1.  Increase amitriptyline to 50mg  at bedtime 2.  Call with an update in 1 month, if your headaches are no better, we can increase your topiramate  Return to clinic 6 months

## 2016-07-12 ENCOUNTER — Ambulatory Visit: Payer: Medicare HMO | Admitting: Sports Medicine

## 2016-07-26 ENCOUNTER — Ambulatory Visit: Payer: Medicare HMO | Admitting: Sports Medicine

## 2016-07-26 ENCOUNTER — Encounter: Payer: Self-pay | Admitting: Endocrinology

## 2016-07-26 ENCOUNTER — Ambulatory Visit (INDEPENDENT_AMBULATORY_CARE_PROVIDER_SITE_OTHER): Payer: Medicare HMO | Admitting: Endocrinology

## 2016-07-26 VITALS — BP 124/64 | HR 82 | Ht 70.0 in | Wt 271.0 lb

## 2016-07-26 DIAGNOSIS — E1142 Type 2 diabetes mellitus with diabetic polyneuropathy: Secondary | ICD-10-CM | POA: Diagnosis not present

## 2016-07-26 LAB — POCT GLYCOSYLATED HEMOGLOBIN (HGB A1C): Hemoglobin A1C: 6.5

## 2016-07-26 MED ORDER — METFORMIN HCL ER 500 MG PO TB24: 1000.0000 mg | ORAL_TABLET | Freq: Every day | ORAL | 3 refills | Status: DC

## 2016-07-26 NOTE — Progress Notes (Signed)
Subjective:    Patient ID: Darrell Horne, male    DOB: 15-Apr-1946, 70 y.o.   MRN: [id number]  HPI Pt returns for f/u of diabetes mellitus: DM type: 2 Dx'ed: [id number] Complications: severe painful neuropathy of all 4 extremities, and left BKA.   Therapy: metformin. DKA: never Severe hypoglycemia: never.  Pancreatitis: never Other: he has never been on insulin; he declines Januvia.   Interval history: no cbg record, but states cbg's are well-controlled.  pt states he feels well in general.   Past Medical History:  Diagnosis Date  . Chest pain   . Diabetes mellitus   . DM2 (diabetes mellitus, type 2) ([place])   . GERD (gastroesophageal reflux disease)   . HLD (hyperlipidemia)   . HTN (hypertension)   . PAF (paroxysmal atrial fibrillation) ([place])   . Palpitations     Past Surgical History:  Procedure Laterality Date  . APPENDECTOMY    . CHOLECYSTECTOMY    . COLONOSCOPY     Dr. Michail Sermon  . HERNIA REPAIR    . LEG AMPUTATION Left [id number]   Dr. Kendell Bane  . SHOULDER SURGERY  [id number]   Dr. Meridee Score  . TOTAL ANKLE ARTHROPLASTY  [id number]    Social History   Social History  . Marital status: Divorced    Spouse name: N/A  . Number of children: 4  . Years of education: N/A   Occupational History  . retired Administrator    Social History Main Topics  . Smoking status: Never Smoker  . Smokeless tobacco: Never Used  . Alcohol use Yes     Comment: Previously drinking a fifth of liquor every two days x 13 years  . Drug use: No  . Sexual activity: Not on file   Other Topics Concern  . Not on file   Social History Narrative   Diet:   Do you drink/eat things with caffeine?  Yes   Marital status:  Divorced.  What year were you married?  2001   Do you live in a house, assisted living, condo, apartment, trailer, etc.?  Apartment   Is it one or more stories?  1 story   How many persons live in your home?  1   Do you have any pets in your home?  No   Current or past  profession:  Truck driver   Do you exercise?  Some  Type and how often:  Walking    Current Outpatient Prescriptions on File Prior to Visit  Medication Sig Dispense Refill  . amitriptyline (ELAVIL) 50 MG tablet Take 1 tablet (50 mg total) by mouth at bedtime. 90 tablet 3  . apixaban (ELIQUIS) 5 MG TABS tablet Take 1 tablet (5 mg total) by mouth 2 (two) times daily. 60 tablet 1  . atorvastatin (LIPITOR) 40 MG tablet Take 1 tablet (40 mg total) by mouth daily. 90 tablet 1  . b complex vitamins tablet Take 1 tablet by mouth daily.    . Cholecalciferol (VITAMIN D PO) Take 1 tablet by mouth 2 (two) times daily.    Marland Kitchen gabapentin (NEURONTIN) 600 MG tablet Take 1 tablet (600 mg total) by mouth 3 (three) times daily. 270 tablet 3  . metoprolol succinate (TOPROL XL) 25 MG 24 hr tablet Take 0.5 tablets (12.5 mg total) by mouth daily. 30 tablet 0  . Polyethyl Glycol-Propyl Glycol (SYSTANE OP) Place 1 drop into both eyes 2 (two) times daily as needed (dry eyes/ burning).    Marland Kitchen tiZANidine (  ZANAFLEX) 4 MG tablet Take 1 tablet (4 mg total) by mouth every 6 (six) hours as needed for muscle spasms. 30 tablet 1  . topiramate (TOPAMAX) 25 MG tablet Take 1 tablet (25 mg total) by mouth daily. 90 tablet 1  . vitamin C (ASCORBIC ACID) 500 MG tablet Take 500 mg by mouth 2 (two) times daily.      No current facility-administered medications on file prior to visit.     No Known Allergies  Family History  Problem Relation Age of Onset  . Heart disease Mother     Living  . Stroke Father 30    Deceased  . Diabetes Sister     BP 124/64   Pulse 82   Ht 5\' 10"  (1.778 m)   Wt 271 lb (122.9 kg)   SpO2 96%   BMI 38.88 kg/m    Review of Systems No weight change.      Objective:   Physical Exam VITAL SIGNS:  See vs page GENERAL: no distress.  In wheelchair Ext: left BKA  Pulses: right dorsalis pedis intact.  RLE: no deformity. 1+ leg edema.   Skin: no ulcer on the right foot: normal color and temp:  healed surgical scar over the right medial malleolus (fx repair).  Neuro: sensation is intact to touch on the right foot, but decreased from normal.  Ext: There is onychomycosis of the right toenails.   A1c=6.5% Lab Results  Component Value Date   CREATININE 1.00 04/17/2016   BUN 9 04/17/2016   NA 138 04/17/2016   K 3.5 04/17/2016   CL 110 04/17/2016   CO2 25 04/17/2016      Assessment & Plan:  Type 2 DM: he needs increased rx, if it can be done with a regimen that avoids or minimizes hypoglycemia. Patient is advised the following: Patient Instructions  check your blood sugar once a day.  vary the time of day when you check, between before the 3 meals, and at bedtime.  also check if you have symptoms of your blood sugar being too high or too low.  please keep a record of the readings and bring it to your next appointment here.  You can write it on any piece of paper.  please call us sooner if your blood sugar goes below 70, or if you have a lot of readings over 200.   Please increasee metformin to 2 pills per day.  I have sent a prescription to your pharmacy Please come back for a follow-up appointment in 6 months.

## 2016-07-26 NOTE — Patient Instructions (Addendum)
check your blood sugar once a day.  vary the time of day when you check, between before the 3 meals, and at bedtime.  also check if you have symptoms of your blood sugar being too high or too low.  please keep a record of the readings and bring it to your next appointment here.  You can write it on any piece of paper.  please call us sooner if your blood sugar goes below 70, or if you have a lot of readings over 200.   Please increasee metformin to 2 pills per day.  I have sent a prescription to your pharmacy Please come back for a follow-up appointment in 6 months.

## 2016-07-26 NOTE — Progress Notes (Signed)
Subjective:     Patient ID: Darrell Horne, male   DOB: Sep 16, 1946, 70 y.o.   MRN: [id number]  HPI   Review of Systems     Objective:   Physical Exam     Assessment:         Plan:

## 2016-08-03 ENCOUNTER — Telehealth: Payer: Self-pay | Admitting: Endocrinology

## 2016-08-03 MED ORDER — METFORMIN HCL ER 500 MG PO TB24: 1000.0000 mg | ORAL_TABLET | Freq: Every day | ORAL | 3 refills | Status: DC

## 2016-08-03 NOTE — Telephone Encounter (Signed)
Pt is requesting the metformin be called into [facility] please

## 2016-08-03 NOTE — Telephone Encounter (Signed)
Refill submitted per patient's request.  

## 2016-08-05 ENCOUNTER — Ambulatory Visit (INDEPENDENT_AMBULATORY_CARE_PROVIDER_SITE_OTHER): Payer: Medicare HMO | Admitting: Internal Medicine

## 2016-08-05 ENCOUNTER — Encounter: Payer: Self-pay | Admitting: Internal Medicine

## 2016-08-05 VITALS — BP 130/78 | HR 79 | Temp 97.7°F | Ht 70.0 in | Wt 270.4 lb

## 2016-08-05 DIAGNOSIS — R51 Headache: Secondary | ICD-10-CM | POA: Diagnosis not present

## 2016-08-05 DIAGNOSIS — E782 Mixed hyperlipidemia: Secondary | ICD-10-CM

## 2016-08-05 DIAGNOSIS — G8929 Other chronic pain: Secondary | ICD-10-CM | POA: Diagnosis not present

## 2016-08-05 DIAGNOSIS — I480 Paroxysmal atrial fibrillation: Secondary | ICD-10-CM

## 2016-08-05 DIAGNOSIS — M545 Low back pain: Secondary | ICD-10-CM

## 2016-08-05 DIAGNOSIS — I10 Essential (primary) hypertension: Secondary | ICD-10-CM

## 2016-08-05 DIAGNOSIS — G4700 Insomnia, unspecified: Secondary | ICD-10-CM

## 2016-08-05 DIAGNOSIS — E1142 Type 2 diabetes mellitus with diabetic polyneuropathy: Secondary | ICD-10-CM | POA: Diagnosis not present

## 2016-08-05 DIAGNOSIS — Z23 Encounter for immunization: Secondary | ICD-10-CM

## 2016-08-05 DIAGNOSIS — Z89512 Acquired absence of left leg below knee: Secondary | ICD-10-CM | POA: Diagnosis not present

## 2016-08-05 DIAGNOSIS — R519 Headache, unspecified: Secondary | ICD-10-CM

## 2016-08-05 NOTE — Patient Instructions (Signed)
Continue current medications as ordered  Flu shot given today  Follow up with specialists as scheduled  Follow up in 3 mos for routine visit. Will get fasting labs at visit

## 2016-08-05 NOTE — Progress Notes (Signed)
Patient ID: Darrell Horne, male   DOB: 28-Jun-1946, 70 y.o.   MRN: [id number]    Location:  [facility] Place of Service: OFFICE  Chief Complaint  Patient presents with  . Medical Management of Chronic Issues    4 month routine visit  . Flu Vaccine    refused    HPI:  70 yo male seen today for f/u. He would like to drive again. He still has difficulty with his memory  DM2 - controlled on metformin which was increased to BID in Oct 2017. Has neuropathy that is uncontrolled and takes gabapentin 1200mg  per day and elavil. 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 He wears a prosthesis. Uses a cane most times but does have a walker when anticipating prolonged standing/walking. Followed by Endo Dr Loanne Drilling. CBGs in 140s. No low BS reactions. He saw podiatry in Feb 2017 and nails debrided. A1c 6.5%. Last eye exam by Dr Zadie Rhine in June 2017 showed no diabetic eye changes. Urine micro/Cr ratio <5.8.  HTN - BP controlled on metoprolol  Hyperlipidemia - stable on lipitor  PAF - rate controlled on metoprolol. Takes eliquis for anticoagulation. Followed by cardio Dr Oval Linsey. Underwent lexiscan myoview in 06/2015 which was nml. 2D echo showed moderate diastolic dysfunction with nml EF. Last ECG in 06/2015 showed 1st degree AVB and LBBB. Previous cardio Dr at [facility]. Occasional palpitations  HA - stable on topamax. Chronic intractable. Followed by neurology Dr Posey Pronto. He was supposed to stop elavil but resumed it as his neuropathy worsened.   Insomnia - stable on elavil  Chronic LBP - pain now stable with prn Norco. Followed by Ortho Dr Sharol Given. Of note, he underwent left rotator cuff repair in Dec 2016.    Past Medical History:  Diagnosis Date  . Chest pain   . Diabetes mellitus   . DM2 (diabetes mellitus, type 2) ([place])   . GERD (gastroesophageal reflux disease)   . HLD (hyperlipidemia)   . HTN (hypertension)   . PAF (paroxysmal atrial fibrillation) ([place])   . Palpitations     Past  Surgical History:  Procedure Laterality Date  . APPENDECTOMY    . CHOLECYSTECTOMY    . COLONOSCOPY     Dr. Michail Sermon  . HERNIA REPAIR    . LEG AMPUTATION Left [id number]   Dr. Kendell Bane  . SHOULDER SURGERY  PT:[id number]   Dr. Meridee Score  . TOTAL ANKLE ARTHROPLASTY  [id number]    Patient Care Team: Gildardo Cranker, DO as PCP - General (Internal Medicine)  Social History   Social History  . Marital status: Divorced    Spouse name: N/A  . Number of children: 4  . Years of education: N/A   Occupational History  . retired Administrator    Social History Main Topics  . Smoking status: Never Smoker  . Smokeless tobacco: Never Used  . Alcohol use Yes     Comment: Previously drinking a fifth of liquor every two days x 13 years  . Drug use: No  . Sexual activity: Not on file   Other Topics Concern  . Not on file   Social History Narrative   Diet:   Do you drink/eat things with caffeine?  Yes   Marital status:  Divorced.  What year were you married?  2001   Do you live in a house, assisted living, condo, apartment, trailer, etc.?  Apartment   Is it one or more stories?  1 story   How many  persons live in your home?  1   Do you have any pets in your home?  No   Current or past profession:  Truck driver   Do you exercise?  Some  Type and how often:  Walking     reports that he has never smoked. He has never used smokeless tobacco. He reports that he drinks alcohol. He reports that he does not use drugs.  Family History  Problem Relation Age of Onset  . Heart disease Mother     Living  . Stroke Father 34    Deceased  . Diabetes Sister    Family Status  Relation Status  . Mother Alive  . Father Deceased  . Sister Alive  . Brother Alive  . Daughter Alive  . Daughter Alive  . Daughter Alive  . Son Alive  . Sister Alive  . Sister Alive  . Sister Alive     No Known Allergies  Medications: Patient's Medications  New Prescriptions   No medications on file    Previous Medications   AMITRIPTYLINE (ELAVIL) 50 MG TABLET    Take 1 tablet (50 mg total) by mouth at bedtime.   APIXABAN (ELIQUIS) 5 MG TABS TABLET    Take 1 tablet (5 mg total) by mouth 2 (two) times daily.   ATORVASTATIN (LIPITOR) 40 MG TABLET    Take 1 tablet (40 mg total) by mouth daily.   B COMPLEX VITAMINS TABLET    Take 1 tablet by mouth daily.   CHOLECALCIFEROL (VITAMIN D PO)    Take 1 tablet by mouth 2 (two) times daily.   GABAPENTIN (NEURONTIN) 600 MG TABLET    Take 1 tablet (600 mg total) by mouth 3 (three) times daily.   METFORMIN (GLUCOPHAGE-XR) 500 MG 24 HR TABLET    Take 2 tablets (1,000 mg total) by mouth daily with supper.   METOPROLOL SUCCINATE (TOPROL XL) 25 MG 24 HR TABLET    Take 0.5 tablets (12.5 mg total) by mouth daily.   POLYETHYL GLYCOL-PROPYL GLYCOL (SYSTANE OP)    Place 1 drop into both eyes 2 (two) times daily as needed (dry eyes/ burning).   TIZANIDINE (ZANAFLEX) 4 MG TABLET    Take 1 tablet (4 mg total) by mouth every 6 (six) hours as needed for muscle spasms.   TOPIRAMATE (TOPAMAX) 25 MG TABLET    Take 1 tablet (25 mg total) by mouth daily.   VITAMIN C (ASCORBIC ACID) 500 MG TABLET    Take 500 mg by mouth 2 (two) times daily.   Modified Medications   No medications on file  Discontinued Medications   No medications on file    Review of Systems  Musculoskeletal: Positive for arthralgias, back pain and gait problem.  Psychiatric/Behavioral: Positive for sleep disturbance.  All other systems reviewed and are negative.   Vitals:   08/05/16 1033  BP: 130/78  Pulse: 79  Temp: 97.7 F (36.5 C)  TempSrc: Oral  SpO2: 97%  Weight: 270 lb 6.4 oz (122.7 kg)  Height: 5\' 10"  (1.778 m)   Body mass index is 38.8 kg/m.  Physical Exam  Constitutional: He appears well-developed and well-nourished.  HENT:  Mouth/Throat: Oropharynx is clear and moist.  Eyes: Pupils are equal, round, and reactive to light. No scleral icterus.  Neck: Neck supple. Carotid bruit  is not present.  Cardiovascular: Normal rate and intact distal pulses.  An irregularly irregular rhythm present. Exam reveals no gallop and no friction rub.   Murmur heard.  Systolic murmur is present with a grade of 1/6  Pulses:      Left dorsalis pedis pulse not accessible.       Left posterior tibial pulse not accessible.  Left BKA. No RLE edema or calf TTP  Pulmonary/Chest: Effort normal and breath sounds normal. He has no wheezes. He has no rales. He exhibits no tenderness.  Abdominal: Soft. Bowel sounds are normal. He exhibits no distension, no abdominal bruit, no pulsatile midline mass and no mass. There is no tenderness. There is no rebound and no guarding.  Musculoskeletal: He exhibits edema and tenderness.  Left BKA with prosthesis intact  Lymphadenopathy:    He has no cervical adenopathy.  Neurological: He is alert.  Skin: Skin is warm and dry. No rash noted.  Psychiatric: He has a normal mood and affect. His behavior is normal.     Labs reviewed: Office Visit on 07/26/2016  Component Date Value Ref Range Status  . Hemoglobin A1C 07/26/2016 6.5   Final    No results found.   Assessment/Plan   ICD-9-CM ICD-10-CM   1. Essential hypertension 401.9 I10   2. Type 2 diabetes mellitus with diabetic polyneuropathy, without long-term current use of insulin (HCC) 250.60 E11.42    357.2    3. Hx of BKA, left ([place]) V49.75 Z89.512   4. Mixed hyperlipidemia 272.2 E78.2   5. PAF (paroxysmal atrial fibrillation) (HCC) 427.31 I48.0   6. Chronic low back pain, unspecified back pain laterality, with sciatica presence unspecified 724.2 M54.5    338.29 G89.29   7. Chronic intractable headache, unspecified headache type 784.0 R51   8. Insomnia, unspecified type 780.52 G47.00   9. Encounter for immunization Z23 Z23 Flu Vaccine QUAD 36+ mos IM    Continue current medications as ordered  Flu shot given today  Follow up with specialists as scheduled  Follow up in 3 mos for routine  visit. Will get fasting labs at visit   [facility]. Perlie Gold  [facility] [address] [place], [place] [address] [phone] Cell (Monday-Friday 8 AM - 5 PM) [phone] After 5 PM and follow prompts

## 2016-09-29 DIAGNOSIS — Z0000 Encounter for general adult medical examination without abnormal findings: Secondary | ICD-10-CM | POA: Diagnosis not present

## 2016-09-29 DIAGNOSIS — I480 Paroxysmal atrial fibrillation: Secondary | ICD-10-CM | POA: Diagnosis not present

## 2016-09-29 DIAGNOSIS — R69 Illness, unspecified: Secondary | ICD-10-CM | POA: Diagnosis not present

## 2016-09-29 DIAGNOSIS — I10 Essential (primary) hypertension: Secondary | ICD-10-CM | POA: Diagnosis not present

## 2016-09-29 DIAGNOSIS — E7800 Pure hypercholesterolemia, unspecified: Secondary | ICD-10-CM | POA: Diagnosis not present

## 2016-09-29 DIAGNOSIS — E1142 Type 2 diabetes mellitus with diabetic polyneuropathy: Secondary | ICD-10-CM | POA: Diagnosis not present

## 2016-10-11 ENCOUNTER — Telehealth: Payer: Self-pay | Admitting: Internal Medicine

## 2016-10-11 NOTE — Telephone Encounter (Signed)
left msg asking pt to confirm this [facility] appt w/ nurse. VDM (DD) °

## 2016-10-18 ENCOUNTER — Other Ambulatory Visit: Payer: Self-pay | Admitting: Internal Medicine

## 2016-10-18 NOTE — Telephone Encounter (Signed)
left another msg asking pt to confirm this [facility] appt w/ nurse. VDM (DD) °

## 2016-11-11 ENCOUNTER — Ambulatory Visit (INDEPENDENT_AMBULATORY_CARE_PROVIDER_SITE_OTHER): Payer: Medicare HMO | Admitting: Internal Medicine

## 2016-11-11 ENCOUNTER — Ambulatory Visit: Payer: Medicare HMO

## 2016-11-11 ENCOUNTER — Ambulatory Visit (INDEPENDENT_AMBULATORY_CARE_PROVIDER_SITE_OTHER): Payer: Medicare HMO

## 2016-11-11 ENCOUNTER — Encounter: Payer: Self-pay | Admitting: Internal Medicine

## 2016-11-11 VITALS — BP 148/80 | HR 83 | Temp 97.6°F | Ht 70.0 in | Wt 265.0 lb

## 2016-11-11 DIAGNOSIS — Z0000 Encounter for general adult medical examination without abnormal findings: Secondary | ICD-10-CM

## 2016-11-11 DIAGNOSIS — G8929 Other chronic pain: Secondary | ICD-10-CM | POA: Diagnosis not present

## 2016-11-11 DIAGNOSIS — Z89512 Acquired absence of left leg below knee: Secondary | ICD-10-CM

## 2016-11-11 DIAGNOSIS — Z79899 Other long term (current) drug therapy: Secondary | ICD-10-CM | POA: Diagnosis not present

## 2016-11-11 DIAGNOSIS — I480 Paroxysmal atrial fibrillation: Secondary | ICD-10-CM | POA: Diagnosis not present

## 2016-11-11 DIAGNOSIS — H9193 Unspecified hearing loss, bilateral: Secondary | ICD-10-CM

## 2016-11-11 DIAGNOSIS — M545 Low back pain: Secondary | ICD-10-CM

## 2016-11-11 DIAGNOSIS — R51 Headache: Secondary | ICD-10-CM | POA: Diagnosis not present

## 2016-11-11 DIAGNOSIS — I10 Essential (primary) hypertension: Secondary | ICD-10-CM | POA: Diagnosis not present

## 2016-11-11 DIAGNOSIS — E1142 Type 2 diabetes mellitus with diabetic polyneuropathy: Secondary | ICD-10-CM

## 2016-11-11 DIAGNOSIS — E782 Mixed hyperlipidemia: Secondary | ICD-10-CM | POA: Diagnosis not present

## 2016-11-11 DIAGNOSIS — R519 Headache, unspecified: Secondary | ICD-10-CM

## 2016-11-11 DIAGNOSIS — Z23 Encounter for immunization: Secondary | ICD-10-CM

## 2016-11-11 LAB — COMPLETE METABOLIC PANEL WITH GFR
ALT: 14 U/L (ref 9–46)
AST: 15 U/L (ref 10–35)
Albumin: 3.6 g/dL (ref 3.6–5.1)
Alkaline Phosphatase: 148 U/L — ABNORMAL HIGH (ref 40–115)
BUN: 12 mg/dL (ref 7–25)
CO2: 24 mmol/L (ref 20–31)
Calcium: 9.2 mg/dL (ref 8.6–10.3)
Chloride: 107 mmol/L (ref 98–110)
Creat: 0.86 mg/dL (ref 0.70–1.18)
GFR, Est African American: >89 mL/min (ref >=60)
GFR, Est Non African American: 87 mL/min (ref >=60)
Glucose, Bld: 104 mg/dL — ABNORMAL HIGH (ref 65–99)
Potassium: 4.7 mmol/L (ref 3.5–5.3)
Sodium: 139 mmol/L (ref 135–146)
Total Bilirubin: 0.6 mg/dL (ref 0.2–1.2)
Total Protein: 7 g/dL (ref 6.1–8.1)

## 2016-11-11 LAB — CBC WITH DIFFERENTIAL/PLATELET
Basophils Absolute: 0 cells/uL (ref 0–200)
Basophils Relative: 0 %
Eosinophils Absolute: 90 cells/uL (ref 15–500)
Eosinophils Relative: 1 %
HCT: 41.2 % (ref 38.5–50.0)
Hemoglobin: 13.6 g/dL (ref 13.2–17.1)
Lymphocytes Relative: 18 %
Lymphs Abs: 1620 cells/uL (ref 850–3900)
MCH: 29.7 pg (ref 27.0–33.0)
MCHC: 33 g/dL (ref 32.0–36.0)
MCV: 90 fL (ref 80.0–100.0)
MPV: 11.7 fL (ref 7.5–12.5)
Monocytes Absolute: 630 cells/uL (ref 200–950)
Monocytes Relative: 7 %
Neutro Abs: 6660 cells/uL (ref 1500–7800)
Neutrophils Relative %: 74 %
Platelets: 121 10*3/uL — ABNORMAL LOW (ref 140–400)
RBC: 4.58 MIL/uL (ref 4.20–5.80)
RDW: 15.1 % — ABNORMAL HIGH (ref 11.0–15.0)
WBC: 9 10*3/uL (ref 3.8–10.8)

## 2016-11-11 LAB — LIPID PANEL
Cholesterol: 109 mg/dL (ref <200)
HDL: 62 mg/dL (ref >40)
LDL Cholesterol: 35 mg/dL (ref <100)
Total CHOL/HDL Ratio: 1.8 Ratio (ref <5.0)
Triglycerides: 61 mg/dL (ref <150)
VLDL: 12 mg/dL (ref <30)

## 2016-11-11 NOTE — Patient Instructions (Addendum)
Continue current medications as ordered  Will call with referral  Will call with lab results  Follow up with specialists as scheduled  Please talk to Dr Loanne Drilling about changing diabetes medication to Victoza  Follow up in 3-4 mos for routine visit

## 2016-11-11 NOTE — Patient Instructions (Addendum)
Darrell Horne , Thank you for taking time to come for your Medicare Wellness Visit. I appreciate your ongoing commitment to your health goals. Please review the following plan we discussed and let me know if I can assist you in the future.   These are the goals we discussed: Goals    . Reduce sugar intake to X grams per day          Starting 11/11/16, I will attempt to decrease my sugar intake.        This is a list of the screening recommended for you and due dates:  Health Maintenance  Topic Date Due  .  Hepatitis C: One time screening is recommended by Center for Disease Control  (CDC) for  adults born from [birth year] through 1966.   06/02/1946  . Urine Protein Check  11/05/2016  . Shingles Vaccine  11/10/2018*  . Tetanus Vaccine  11/10/2018*  . Hemoglobin A1C  01/24/2017  . Eye exam for diabetics  03/22/2017  . Complete foot exam   07/26/2017  . Colon Cancer Screening  08/19/2024  . Flu Shot  Completed  . Pneumonia vaccines  Completed  *Topic was postponed. The date shown is not the original due date.  Preventive Care for Adults  A healthy lifestyle and preventive care can promote health and wellness. Preventive health guidelines for adults include the following key practices.  . A routine yearly physical is a good way to check with your health care provider about your health and preventive screening. It is a chance to share any concerns and updates on your health and to receive a thorough exam.  . Visit your dentist for a routine exam and preventive care every 6 months. Brush your teeth twice a day and floss once a day. Good oral hygiene prevents tooth decay and gum disease.  . The frequency of eye exams is based on your age, health, family medical history, use  of contact lenses, and other factors. Follow your health care provider's ecommendations for frequency of eye exams.  . Eat a healthy diet. Foods like vegetables, fruits, whole grains, low-fat dairy products, and lean protein  foods contain the nutrients you need without too many calories. Decrease your intake of foods high in solid fats, added sugars, and salt. Eat the right amount of calories for you. Get information about a proper diet from your health care provider, if necessary.  . Regular physical exercise is one of the most important things you can do for your health. Most adults should get at least 150 minutes of moderate-intensity exercise (any activity that increases your heart rate and causes you to sweat) each week. In addition, most adults need muscle-strengthening exercises on 2 or more days a week.  Silver Sneakers may be a benefit available to you. To determine eligibility, you may visit the website: www.silversneakers.com or contact program at (480)046-2128 Mon-Fri between 8AM-8PM.   . Maintain a healthy weight. The body mass index (BMI) is a screening tool to identify possible weight problems. It provides an estimate of body fat based on height and weight. Your health care provider can find your BMI and can help you achieve or maintain a healthy weight.   For adults 20 years and older: ? A BMI below 18.5 is considered underweight. ? A BMI of 18.5 to 24.9 is normal. ? A BMI of 25 to 29.9 is considered overweight. ? A BMI of 30 and above is considered obese.   . Maintain normal blood lipids  and cholesterol levels by exercising and minimizing your intake of saturated fat. Eat a balanced diet with plenty of fruit and vegetables. Blood tests for lipids and cholesterol should begin at age 25 and be repeated every 5 years. If your lipid or cholesterol levels are high, you are over 50, or you are at high risk for heart disease, you may need your cholesterol levels checked more frequently. Ongoing high lipid and cholesterol levels should be treated with medicines if diet and exercise are not working.  . If you smoke, find out from your health care provider how to quit. If you do not use tobacco, please do not  start.  . If you choose to drink alcohol, please do not consume more than 2 drinks per day. One drink is considered to be 12 ounces (355 mL) of beer, 5 ounces (148 mL) of wine, or 1.5 ounces (44 mL) of liquor.  . If you are 4-100 years old, ask your health care provider if you should take aspirin to prevent strokes.  . Use sunscreen. Apply sunscreen liberally and repeatedly throughout the day. You should seek shade when your shadow is shorter than you. Protect yourself by wearing long sleeves, pants, a wide-brimmed hat, and sunglasses year round, whenever you are outdoors.  . Once a month, do a whole body skin exam, using a mirror to look at the skin on your back. Tell your health care provider of new moles, moles that have irregular borders, moles that are larger than a pencil eraser, or moles that have changed in shape or color.

## 2016-11-11 NOTE — Progress Notes (Signed)
Subjective:   Darrell Horne is a 71 y.o. male who presents for Medicare Annual (Subsequent) preventive examination.  Review of Systems: Cardiac Risk Factors include: advanced age (>18men, >101 women);diabetes mellitus;dyslipidemia;family history of premature cardiovascular disease;male gender;hypertension;sedentary lifestyle;obesity (BMI >30kg/m2)     Objective:     Vitals: BP (!) 148/80 (BP Location: Right Arm, Patient Position: Sitting, Cuff Size: Normal)   Pulse 83   Temp 97.6 F (36.4 C) (Oral)   Ht 5\' 10"  (1.778 m)   Wt 265 lb (120.2 kg)   SpO2 98%   BMI 38.02 kg/m   Body mass index is 38.02 kg/m.   Tobacco History  Smoking Status  . Never Smoker  Smokeless Tobacco  . Never Used     Counseling given: No   Past Medical History:  Diagnosis Date  . Chest pain   . Diabetes mellitus   . DM2 (diabetes mellitus, type 2) ([place])   . GERD (gastroesophageal reflux disease)   . HLD (hyperlipidemia)   . HTN (hypertension)   . PAF (paroxysmal atrial fibrillation) ([place])   . Palpitations    Past Surgical History:  Procedure Laterality Date  . APPENDECTOMY    . CHOLECYSTECTOMY    . COLONOSCOPY     Dr. Michail Sermon  . HERNIA REPAIR    . LEG AMPUTATION Left [id number]   Dr. Kendell Bane  . SHOULDER SURGERY  [id number]   Dr. Meridee Score  . TOTAL ANKLE ARTHROPLASTY  [id number]   Family History  Problem Relation Age of Onset  . Heart disease Mother     Living  . Stroke Father 28    Deceased  . Diabetes Sister   . Cancer Sister    History  Sexual Activity  . Sexual activity: No    Outpatient Encounter Prescriptions as of 11/11/2016  Medication Sig  . amitriptyline (ELAVIL) 50 MG tablet Take 1 tablet (50 mg total) by mouth at bedtime.  Marland Kitchen apixaban (ELIQUIS) 5 MG TABS tablet Take 1 tablet (5 mg total) by mouth 2 (two) times daily.  Marland Kitchen atorvastatin (LIPITOR) 40 MG tablet TAKE 1 TABLET DAILY  . b complex vitamins tablet Take 1 tablet by mouth daily.  . Cholecalciferol  (VITAMIN D PO) Take 1 tablet by mouth 2 (two) times daily.  Marland Kitchen gabapentin (NEURONTIN) 600 MG tablet Take 1 tablet (600 mg total) by mouth 3 (three) times daily.  . metFORMIN (GLUCOPHAGE-XR) 500 MG 24 hr tablet Take 2 tablets (1,000 mg total) by mouth daily with supper.  . metoprolol succinate (TOPROL XL) 25 MG 24 hr tablet Take 0.5 tablets (12.5 mg total) by mouth daily.  Vladimir Faster Glycol-Propyl Glycol (SYSTANE OP) Place 1 drop into both eyes 2 (two) times daily as needed (dry eyes/ burning).  Marland Kitchen tiZANidine (ZANAFLEX) 4 MG tablet Take 1 tablet (4 mg total) by mouth every 6 (six) hours as needed for muscle spasms.  Marland Kitchen topiramate (TOPAMAX) 25 MG tablet Take 1 tablet (25 mg total) by mouth daily.  . vitamin C (ASCORBIC ACID) 500 MG tablet Take 500 mg by mouth 2 (two) times daily.    No facility-administered encounter medications on file as of 11/11/2016.     Activities of Daily Living In your present state of health, do you have any difficulty performing the following activities: 11/11/2016 04/17/2016  Hearing? Y N  Vision? Y N  Difficulty concentrating or making decisions? Y N  Walking or climbing stairs? Y N  Dressing or bathing? N N  Doing errands, shopping?  Y N  Preparing Food and eating ? N -  Using the Toilet? N -  In the past six months, have you accidently leaked urine? Y -  Do you have problems with loss of bowel control? N -  Managing your Medications? N -  Managing your Finances? N -  Housekeeping or managing your Housekeeping? N -  Some recent data might be hidden    Patient Care Team: Gildardo Cranker, DO as PCP - General (Internal Medicine)    Assessment:    Exercise Activities and Dietary recommendations Current Exercise Habits: The patient does not participate in regular exercise at present, Exercise limited by: orthopedic condition(s)  Goals    . Reduce sugar intake to X grams per day          Starting 11/11/16, I will attempt to decrease my sugar intake.       Fall  Risk Fall Risk  11/11/2016 08/05/2016 06/27/2016 05/06/2016 04/01/2016  Falls in the past year? Yes No Yes Yes Yes  Number falls in past yr: 2 or more - 1 1 2  or more  Injury with Fall? No - No No No  Risk Factor Category  High Fall Risk - - - -  Risk for fall due to : History of fall(s);Impaired balance/gait - Impaired balance/gait;Impaired mobility - -  Follow up Falls evaluation completed - Falls evaluation completed;Education provided;Falls prevention discussed - -   Depression Screen PHQ 2/9 Scores 11/11/2016 12/09/2015  PHQ - 2 Score 1 0     Cognitive Function MMSE - Mini Mental State Exam 11/11/2016 12/09/2015  Not completed: - (No Data)  Orientation to time 5 5  Orientation to Place 5 5  Registration 3 3  Attention/ Calculation 4 5  Recall 3 2  Language- name 2 objects 2 2  Language- repeat 1 1  Language- follow 3 step command 3 3  Language- read & follow direction 1 1  Write a sentence 1 1  Copy design 1 1  Total score 29 29    Hearing Screening   125Hz  250Hz  500Hz  1000Hz  2000Hz  3000Hz  4000Hz  6000Hz  8000Hz   Right ear:   0 0 0  0    Left ear:   0 0 0  0    Comments: Pt states he has never had a hearing screen and would like a referral for audiology. Failed office hearing screen.   Vision Screening Comments: Last eye exam was done Nov. 2017 with Dr. Zadie Rhine.         Immunization History  Administered Date(s) Administered  . DTaP 10/10/2014  . Influenza,inj,Quad PF,36+ Mos 08/05/2016  . Influenza-Unspecified 10/10/2014  . Pneumococcal Conjugate-13 11/11/2016  . Pneumococcal-Unspecified 10/10/2014   Screening Tests Health Maintenance  Topic Date Due  . Hepatitis C Screening  1946-01-18  . URINE MICROALBUMIN  11/05/2016  . ZOSTAVAX  11/10/2018 (Originally 09/13/2005)  . TETANUS/TDAP  11/10/2018 (Originally 09/13/1964)  . HEMOGLOBIN A1C  01/24/2017  . OPHTHALMOLOGY EXAM  03/22/2017  . FOOT EXAM  07/26/2017  . COLONOSCOPY  08/19/2024  . INFLUENZA VACCINE  Completed  .  PNA vac Low Risk Adult  Completed      Plan:    I have personally reviewed and addressed the Medicare Annual Wellness questionnaire and have noted the following in the patient's chart:  A. Medical and social history B. Use of alcohol, tobacco or illicit drugs  C. Current medications and supplements D. Functional ability and status E.  Nutritional status F.  Physical activity G.  Advance directives H. List of other physicians I.  Hospitalizations, surgeries, and ER visits in previous 12 months J.  [place] to include hearing, vision, cognitive, depression L. Referrals and appointments - none  In addition, I have reviewed and discussed with patient certain preventive protocols, quality metrics, and best practice recommendations. A written personalized care plan for preventive services as well as general preventive health recommendations were provided to patient.  See attached scanned questionnaire for additional information.   Signed,   Allyn Kenner, LPN Health Advisor  I have reviewed the health advisor's note and was available for consultation. I agree with documentation and plan.   Jamayah Myszka S. Perlie Gold  [facility] and Adult Medicine [address] [place], [place] [address] [phone] Cell (Monday-Friday 8 AM - 5 PM) [phone] After 5 PM and follow prompts

## 2016-11-11 NOTE — Progress Notes (Signed)
Patient ID: Darrell Horne, male   DOB: 1946-08-25, 71 y.o.   MRN: [id number]    Location:  [facility] Place of Service: OFFICE  Chief Complaint  Patient presents with  . Medical Management of Chronic Issues    3 month routine    HPI:  71 yo male seen today for f/u. He had AWV this AM. BS stable at home but he is interested in changing diabetic med to [place] as it is a once per week injection. No other concerns. He continues to struggle with memory loss and is a poor historian as a result. Hx obtained from chart.    DM2 - controlled on metformin which was increased to BID in Oct 2017. Has neuropathy that is uncontrolled and takes gabapentin 1255m per day and elavil. 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 He wears a prosthesis. Uses a cane most times but does have a walker when anticipating prolonged standing/walking. Followed by Endo Dr ELoanne Drilling CBGs 110- 140s. No low BS reactions. He saw podiatry in Feb 2017 and nails debrided. A1c 6.5%. Last eye exam by Dr RZadie Rhinein June 2017 showed no diabetic eye changes. Urine micro/Cr ratio <5.8.  HTN - BP controlled on metoprolol  Hyperlipidemia - stable on lipitor. LDL 48  PAF - rate controlled on metoprolol. Takes eliquis for anticoagulation. Followed by cardio Dr ROval Linsey Underwent lexiscan myoview in 06/2015 which was nml. 2D echo showed moderate diastolic dysfunction with nml EF. Last ECG in 06/2015 showed 1st degree AVB and LBBB. Previous cardio Dr at [facility] Occasional palpitations  HA - stable on topamax. Chronic intractable. Followed by neurology Dr PPosey Pronto He was supposed to stop elavil but resumed it as his neuropathy worsened.   Insomnia - stable on elavil  Chronic LBP - pain now stable with prn Norco and zanaflex. Followed by Ortho Dr DSharol Given Of note, he underwent left rotator cuff repair in Dec 2016.  Memory loss - he still does not drive. MMSE 29/30 in 11/11/16  Past Medical History:  Diagnosis Date  . Chest pain   .  Diabetes mellitus   . DM2 (diabetes mellitus, type 2) ([place]   . GERD (gastroesophageal reflux disease)   . HLD (hyperlipidemia)   . HTN (hypertension)   . PAF (paroxysmal atrial fibrillation) ([place]   . Palpitations     Past Surgical History:  Procedure Laterality Date  . APPENDECTOMY    . CHOLECYSTECTOMY    . COLONOSCOPY     Dr. SMichail Sermon . HERNIA REPAIR    . LEG AMPUTATION Left [id number]  Dr. RKendell Bane . SHOULDER SURGERY  [phone]  Dr. MMeridee Score . TOTAL ANKLE ARTHROPLASTY  [id number]   Patient Care Team: MGildardo Cranker DO as PCP - General (Internal Medicine)  Social History   Social History  . Marital status: Divorced    Spouse name: N/A  . Number of children: 4  . Years of education: N/A   Occupational History  . retired tAdministrator   Social History Main Topics  . Smoking status: Never Smoker  . Smokeless tobacco: Never Used  . Alcohol use No     Comment: Previously drinking a fifth of liquor every two days x 13 years  . Drug use: No  . Sexual activity: No   Other Topics Concern  . Not on file   Social History Narrative   Diet:   Do you drink/eat things with caffeine?  Yes   Marital  status:  Divorced.  What year were you married?  2001   Do you live in a house, assisted living, condo, apartment, trailer, etc.?  Apartment   Is it one or more stories?  1 story   How many persons live in your home?  1   Do you have any pets in your home?  No   Current or past profession:  Truck driver   Do you exercise?  Some  Type and how often:  Walking     reports that he has never smoked. He has never used smokeless tobacco. He reports that he does not drink alcohol or use drugs.  Family History  Problem Relation Age of Onset  . Heart disease Mother     Living  . Stroke Father 81    Deceased  . Diabetes Sister   . Cancer Sister    Family Status  Relation Status  . Mother Deceased at age 19  . Father Deceased  . Sister Alive  . Brother Alive  .  Daughter Alive  . Daughter Alive  . Daughter Alive  . Son Alive  . Sister Alive  . Sister Deceased   Throat Cancer     No Known Allergies  Medications: Patient's Medications  New Prescriptions   No medications on file  Previous Medications   AMITRIPTYLINE (ELAVIL) 50 MG TABLET    Take 1 tablet (50 mg total) by mouth at bedtime.   APIXABAN (ELIQUIS) 5 MG TABS TABLET    Take 1 tablet (5 mg total) by mouth 2 (two) times daily.   ATORVASTATIN (LIPITOR) 40 MG TABLET    TAKE 1 TABLET DAILY   B COMPLEX VITAMINS TABLET    Take 1 tablet by mouth daily.   CHOLECALCIFEROL (VITAMIN D PO)    Take 1 tablet by mouth 2 (two) times daily.   GABAPENTIN (NEURONTIN) 600 MG TABLET    Take 1 tablet (600 mg total) by mouth 3 (three) times daily.   METFORMIN (GLUCOPHAGE-XR) 500 MG 24 HR TABLET    Take 2 tablets (1,000 mg total) by mouth daily with supper.   METOPROLOL SUCCINATE (TOPROL XL) 25 MG 24 HR TABLET    Take 0.5 tablets (12.5 mg total) by mouth daily.   POLYETHYL GLYCOL-PROPYL GLYCOL (SYSTANE OP)    Place 1 drop into both eyes 2 (two) times daily as needed (dry eyes/ burning).   TIZANIDINE (ZANAFLEX) 4 MG TABLET    Take 1 tablet (4 mg total) by mouth every 6 (six) hours as needed for muscle spasms.   TOPIRAMATE (TOPAMAX) 25 MG TABLET    Take 1 tablet (25 mg total) by mouth daily.   VITAMIN C (ASCORBIC ACID) 500 MG TABLET    Take 500 mg by mouth 2 (two) times daily.   Modified Medications   No medications on file  Discontinued Medications   No medications on file    Review of Systems  Musculoskeletal: Positive for arthralgias, back pain and gait problem.  Psychiatric/Behavioral: Positive for sleep disturbance.  All other systems reviewed and are negative.   Vitals:   11/11/16 0912  BP: (!) 148/80  Pulse: 83  Temp: 97.6 F (36.4 C)  TempSrc: Oral  SpO2: 98%  Weight: 265 lb (120.2 kg)  Height: _0  (1.778 m)   Body mass index is 38.02 kg/m.  Physical Exam  Constitutional: He  appears well-developed and well-nourished.  HENT:  Mouth/Throat: Oropharynx is clear and moist.  Eyes: Pupils are equal, round, and reactive  to light. No scleral icterus.  Neck: Neck supple. Carotid bruit is not present.  Cardiovascular: Normal rate, regular rhythm and intact distal pulses.  Exam reveals no gallop and no friction rub.   Murmur heard.  Systolic murmur is present with a grade of 1/6  Pulses:      Left dorsalis pedis pulse not accessible.       Left posterior tibial pulse not accessible.  Left BKA. No RLE edema or calf TTP  Pulmonary/Chest: Effort normal and breath sounds normal. He has no wheezes. He has no rales. He exhibits no tenderness.  Abdominal: Soft. Bowel sounds are normal. He exhibits no distension, no abdominal bruit, no pulsatile midline mass and no mass. There is no tenderness. There is no rebound and no guarding.  Musculoskeletal: He exhibits edema and tenderness.  Left BKA with prosthesis intact  Lymphadenopathy:    He has no cervical adenopathy.  Neurological: He is alert.  Skin: Skin is warm and dry. No rash noted.  Psychiatric: He has a normal mood and affect. His behavior is normal.     Labs reviewed: No visits with results within 3 Month(s) from this visit.  Latest known visit with results is:  Office Visit on 07/26/2016  Component Date Value Ref Range Status  . Hemoglobin A1C 07/26/2016 6.5   Final    No results found.   Assessment/Plan   ICD-9-CM ICD-10-CM   1. Chronic low back pain, unspecified back pain laterality, with sciatica presence unspecified 724.2 M54.5    338.29 G89.29   2. Type 2 diabetes mellitus with diabetic polyneuropathy, without long-term current use of insulin (HCC) 250.60 E11.42 CMP with eGFR   357.2    3. PAF (paroxysmal atrial fibrillation) (HCC) 427.31 I48.0   4. Essential hypertension 401.9 I10   5. Hx of BKA, left ([place]) V49.75 Z89.512   6. Chronic intractable headache, unspecified headache type 784.0 R51   7.  Mixed hyperlipidemia 272.2 E78.2 Lipid Panel  8. High risk medication use V58.69 Z79.899 CMP with eGFR     CBC with Differential/Platelets     Continue current medications as ordered  Will call with referral  Will call with lab results  Follow up with specialists as scheduled  Please talk to Dr Loanne Drilling about changing diabetes medication to Victoza. Education handout given  Follow up in 3-4 mos for routine visit  Angelika Jerrett S. Perlie Gold  [facility] and Adult Medicine [address] [place], [place] [address] [phone] Cell (Monday-Friday 8 AM - 5 PM) [phone] After 5 PM and follow prompts

## 2016-11-11 NOTE — Progress Notes (Signed)
Quick Notes   Health Maintenance:  Pn13 given today; Up to date on maintenance    Abnormal Screen: None; MMSE-29/30 Passed Clock test    Patient Concerns:  Pt needs referral for audiology, decrease in hearing. (already entered)    Nurse Concerns:  None

## 2016-11-22 ENCOUNTER — Ambulatory Visit (INDEPENDENT_AMBULATORY_CARE_PROVIDER_SITE_OTHER): Payer: Medicare HMO | Admitting: Podiatry

## 2016-11-22 ENCOUNTER — Encounter: Payer: Self-pay | Admitting: Podiatry

## 2016-11-22 VITALS — BP 133/71 | HR 82

## 2016-11-22 DIAGNOSIS — L089 Local infection of the skin and subcutaneous tissue, unspecified: Secondary | ICD-10-CM

## 2016-11-22 DIAGNOSIS — B351 Tinea unguium: Secondary | ICD-10-CM | POA: Diagnosis not present

## 2016-11-22 DIAGNOSIS — E1142 Type 2 diabetes mellitus with diabetic polyneuropathy: Secondary | ICD-10-CM

## 2016-11-22 DIAGNOSIS — E1140 Type 2 diabetes mellitus with diabetic neuropathy, unspecified: Secondary | ICD-10-CM | POA: Diagnosis not present

## 2016-11-22 DIAGNOSIS — S90821A Blister (nonthermal), right foot, initial encounter: Secondary | ICD-10-CM | POA: Diagnosis not present

## 2016-11-22 MED ORDER — CEPHALEXIN 500 MG PO CAPS: 500.0000 mg | ORAL_CAPSULE | Freq: Three times a day (TID) | ORAL | 1 refills | Status: DC

## 2016-11-22 NOTE — Progress Notes (Signed)
   Subjective:    Patient ID: Darrell Horne, male    DOB: [birth year], 71 y.o.   MRN: [id number]  HPI   This patient presents today requesting trimming of toenails on the right foot. This is the primary reason he presented. Also, patient states in the last 3 days he is developed a blister on his right great toe and has been applying topical antibiotic ointment to the area. He denies any fever, chills, swelling or redness in around the blister area Patient is diabetic with a history of BK amputation left Patient is former smoker     Review of Systems  All other systems reviewed and are negative.      Objective:   Physical Exam  Patient appears orientated 3 no apparent distress  Vascular: Pitting edema right ankle DP pulse 1/4 right PT pulse/4 right Capillary reflex immediate bilaterally  Neurological: Sensation to 10 g monofilament wire intact 0/5 right Vibratory sensation nonreactive right Ankle reflex reactive bilaterally right  Dermatological: Atrophic skin right 20 mm x 10 mm blister medial right hallux. 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

## 2016-11-22 NOTE — Patient Instructions (Addendum)
Apply topical antibiotic ointment to the blister on your right great toe daily and cover with a Band-Aid. If you notice any sudden increase in pain, swelling, fever, redness, drainage present to the emergency room Intaking your antibiotic by mouth 1 capsule 3 times a day 7 days Limit amount of standing and walking   Diabetes and Foot Care Diabetes may cause you to have problems because of poor blood supply (circulation) to your feet and legs. This may cause the skin on your feet to become thinner, break easier, and heal more slowly. Your skin may become dry, and the skin may peel and crack. You may also have nerve damage in your legs and feet causing decreased feeling in them. You may not notice minor injuries to your feet that could lead to infections or more serious problems. Taking care of your feet is one of the most important things you can do for yourself. Follow these instructions at home:  Wear shoes at all times, even in the house. Do not go barefoot. Bare feet are easily injured.  Check your feet daily for blisters, cuts, and redness. If you cannot see the bottom of your feet, use a mirror or ask someone for help.  Wash your feet with warm water (do not use hot water) and mild soap. Then pat your feet and the areas between your toes until they are completely dry. Do not soak your feet as this can dry your skin.  Apply a moisturizing lotion or petroleum jelly (that does not contain alcohol and is unscented) to the skin on your feet and to dry, brittle toenails. Do not apply lotion between your toes.  Trim your toenails straight across. Do not dig under them or around the cuticle. File the edges of your nails with an emery board or nail file.  Do not cut corns or calluses or try to remove them with medicine.  Wear clean socks or stockings every day. Make sure they are not too tight. Do not wear knee-high stockings since they may decrease blood flow to your legs.  Wear shoes that fit  properly and have enough cushioning. To break in new shoes, wear them for just a few hours a day. This prevents you from injuring your feet. Always look in your shoes before you put them on to be sure there are no objects inside.  Do not cross your legs. This may decrease the blood flow to your feet.  If you find a minor scrape, cut, or break in the skin on your feet, keep it and the skin around it clean and dry. These areas may be cleansed with mild soap and water. Do not cleanse the area with peroxide, alcohol, or iodine.  When you remove an adhesive bandage, be sure not to damage the skin around it.  If you have a wound, look at it several times a day to make sure it is healing.  Do not use heating pads or hot water bottles. They may burn your skin. If you have lost feeling in your feet or legs, you may not know it is happening until it is too late.  Make sure your health care provider performs a complete foot exam at least annually or more often if you have foot problems. Report any cuts, sores, or bruises to your health care provider immediately. Contact a health care provider if:  You have an injury that is not healing.  You have cuts or breaks in the skin.  You have  an ingrown nail.  You notice redness on your legs or feet.  You feel burning or tingling in your legs or feet.  You have pain or cramps in your legs and feet.  Your legs or feet are numb.  Your feet always feel cold. Get help right away if:  There is increasing redness, swelling, or pain in or around a wound.  There is a red line that goes up your leg.  Pus is coming from a wound.  You develop a fever or as directed by your health care provider.  You notice a bad smell coming from an ulcer or wound. This information is not intended to replace advice given to you by your health care provider. Make sure you discuss any questions you have with your health care provider. Document Released: 09/23/2000 Document  Revised: 03/03/2016 Document Reviewed: 03/05/2013 Elsevier Interactive Patient Education  2017 [employer].

## 2016-11-29 ENCOUNTER — Ambulatory Visit (INDEPENDENT_AMBULATORY_CARE_PROVIDER_SITE_OTHER): Payer: Medicare HMO | Admitting: Podiatry

## 2016-11-29 ENCOUNTER — Ambulatory Visit: Payer: Medicare HMO | Admitting: Podiatry

## 2016-11-29 VITALS — BP 104/59 | HR 73 | Resp 16

## 2016-11-29 DIAGNOSIS — L089 Local infection of the skin and subcutaneous tissue, unspecified: Secondary | ICD-10-CM

## 2016-11-29 DIAGNOSIS — S90821A Blister (nonthermal), right foot, initial encounter: Secondary | ICD-10-CM | POA: Diagnosis not present

## 2016-11-29 NOTE — Progress Notes (Signed)
Patient ID: Darrell Horne, male   DOB: 06/04/1946, 71 y.o.   MRN: [id number]   Subjective: Patient presents 1 week follow-up for blister formation right hallux. Initial treatment on the visit of 11/22/2016 with approximately three-day history prior to that last visit. Patient states that he is able to complete the antibiotics 3 times a day denies any difficulty with antibiotic. He states that he is using old existing Silvadene her previous wound Patient is diabetic with a history of BK amputation left Patient is former smoker   Objective:  Vascular: Pitting edema right ankle DP pulse 1/4 right PT pulse/4 right Capillary reflex immediate bilaterally  Neurological: Sensation to 10 g monofilament wire intact 0/5 right Vibratory sensation nonreactive right Ankle reflex reactive bilaterally right  Dermatological: Atrophic skin right 20 mm x 10 mm blister medial right hallux. There is no surrounding erythema, edema, warmth, drainage, malodor Small nondraining blister dorsal PIPJ second right toe. 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 Blister formation right hallux the clinic he does not appear to be infected Blister formation second right toe without clinical sign of infection  Plan: Patient instructed to apply foam cushion around the blister formation right great toe, apply triple antibiotic ointment and attach with Coflex tape Apply triple antibiotic ointment and Band-Aid to the second right toe Bandage changes will be when patient gets bandages wet Patient instructed if notices anysuddenincreaseinpain,swelling,redness,fever,warmthtopresenttotheemergencydepartment Patient informed that with his diabetic condition and neuropathy is at risk for nonhealing  wound Patient instructed to use wheelchair and reduce the amount of standing walking  Reappoint 7 days

## 2016-11-29 NOTE — Patient Instructions (Signed)
Apply triple antibiotic ointment to the blister on the right great toe daily, attached a foam pad around the blister, using a small amount of gauze in the donut hole and secure with Coflex tape. Do not pull tightly on the tape Apply a small amount of triple antibiotic ointment to the beginning blister in the second right toe daily or when bandage gets wet and cover with a Band-Aid. If you notice any sudden increase in pain, swelling, redness, fever, drainage present to the emergency department    Diabetes and Foot Care Diabetes may cause you to have problems because of poor blood supply (circulation) to your feet and legs. This may cause the skin on your feet to become thinner, break easier, and heal more slowly. Your skin may become dry, and the skin may peel and crack. You may also have nerve damage in your legs and feet causing decreased feeling in them. You may not notice minor injuries to your feet that could lead to infections or more serious problems. Taking care of your feet is one of the most important things you can do for yourself. Follow these instructions at home:  Wear shoes at all times, even in the house. Do not go barefoot. Bare feet are easily injured.  Check your feet daily for blisters, cuts, and redness. If you cannot see the bottom of your feet, use a mirror or ask someone for help.  Wash your feet with warm water (do not use hot water) and mild soap. Then pat your feet and the areas between your toes until they are completely dry. Do not soak your feet as this can dry your skin.  Apply a moisturizing lotion or petroleum jelly (that does not contain alcohol and is unscented) to the skin on your feet and to dry, brittle toenails. Do not apply lotion between your toes.  Trim your toenails straight across. Do not dig under them or around the cuticle. File the edges of your nails with an emery board or nail file.  Do not cut corns or calluses or try to remove them with  medicine.  Wear clean socks or stockings every day. Make sure they are not too tight. Do not wear knee-high stockings since they may decrease blood flow to your legs.  Wear shoes that fit properly and have enough cushioning. To break in new shoes, wear them for just a few hours a day. This prevents you from injuring your feet. Always look in your shoes before you put them on to be sure there are no objects inside.  Do not cross your legs. This may decrease the blood flow to your feet.  If you find a minor scrape, cut, or break in the skin on your feet, keep it and the skin around it clean and dry. These areas may be cleansed with mild soap and water. Do not cleanse the area with peroxide, alcohol, or iodine.  When you remove an adhesive bandage, be sure not to damage the skin around it.  If you have a wound, look at it several times a day to make sure it is healing.  Do not use heating pads or hot water bottles. They may burn your skin. If you have lost feeling in your feet or legs, you may not know it is happening until it is too late.  Make sure your health care provider performs a complete foot exam at least annually or more often if you have foot problems. Report any cuts, sores, or  bruises to your health care provider immediately. Contact a health care provider if:  You have an injury that is not healing.  You have cuts or breaks in the skin.  You have an ingrown nail.  You notice redness on your legs or feet.  You feel burning or tingling in your legs or feet.  You have pain or cramps in your legs and feet.  Your legs or feet are numb.  Your feet always feel cold. Get help right away if:  There is increasing redness, swelling, or pain in or around a wound.  There is a red line that goes up your leg.  Pus is coming from a wound.  You develop a fever or as directed by your health care provider.  You notice a bad smell coming from an ulcer or wound. This information is  not intended to replace advice given to you by your health care provider. Make sure you discuss any questions you have with your health care provider. Document Released: 09/23/2000 Document Revised: 03/03/2016 Document Reviewed: 03/05/2013 Elsevier Interactive Patient Education  2017 [employer].

## 2016-12-06 ENCOUNTER — Encounter: Payer: Self-pay | Admitting: Podiatry

## 2016-12-06 ENCOUNTER — Ambulatory Visit (INDEPENDENT_AMBULATORY_CARE_PROVIDER_SITE_OTHER): Payer: Medicare HMO | Admitting: Podiatry

## 2016-12-06 VITALS — BP 147/80 | HR 85

## 2016-12-06 DIAGNOSIS — S90821A Blister (nonthermal), right foot, initial encounter: Secondary | ICD-10-CM

## 2016-12-06 DIAGNOSIS — L089 Local infection of the skin and subcutaneous tissue, unspecified: Secondary | ICD-10-CM

## 2016-12-06 MED ORDER — AMOXICILLIN-POT CLAVULANATE 875-125 MG PO TABS: 1.0000 | ORAL_TABLET | Freq: Two times a day (BID) | ORAL | 1 refills | Status: DC

## 2016-12-06 NOTE — Patient Instructions (Addendum)
Take your antibiotics by mouth one twice a day 7 days Continue to apply topical antibiotic ointment and cushioning around the sore on your right big toe daily and cover with gauze If you notice any sudden increase in pain, swelling, redness, fever, drainage present to the emergency department  Diabetes and Foot Care Diabetes may cause you to have problems because of poor blood supply (circulation) to your feet and legs. This may cause the skin on your feet to become thinner, break easier, and heal more slowly. Your skin may become dry, and the skin may peel and crack. You may also have nerve damage in your legs and feet causing decreased feeling in them. You may not notice minor injuries to your feet that could lead to infections or more serious problems. Taking care of your feet is one of the most important things you can do for yourself. Follow these instructions at home:  Wear shoes at all times, even in the house. Do not go barefoot. Bare feet are easily injured.  Check your feet daily for blisters, cuts, and redness. If you cannot see the bottom of your feet, use a mirror or ask someone for help.  Wash your feet with warm water (do not use hot water) and mild soap. Then pat your feet and the areas between your toes until they are completely dry. Do not soak your feet as this can dry your skin.  Apply a moisturizing lotion or petroleum jelly (that does not contain alcohol and is unscented) to the skin on your feet and to dry, brittle toenails. Do not apply lotion between your toes.  Trim your toenails straight across. Do not dig under them or around the cuticle. File the edges of your nails with an emery board or nail file.  Do not cut corns or calluses or try to remove them with medicine.  Wear clean socks or stockings every day. Make sure they are not too tight. Do not wear knee-high stockings since they may decrease blood flow to your legs.  Wear shoes that fit properly and have  enough cushioning. To break in new shoes, wear them for just a few hours a day. This prevents you from injuring your feet. Always look in your shoes before you put them on to be sure there are no objects inside.  Do not cross your legs. This may decrease the blood flow to your feet.  If you find a minor scrape, cut, or break in the skin on your feet, keep it and the skin around it clean and dry. These areas may be cleansed with mild soap and water. Do not cleanse the area with peroxide, alcohol, or iodine.  When you remove an adhesive bandage, be sure not to damage the skin around it.  If you have a wound, look at it several times a day to make sure it is healing.  Do not use heating pads or hot water bottles. They may burn your skin. If you have lost feeling in your feet or legs, you may not know it is happening until it is too late.  Make sure your health care provider performs a complete foot exam at least annually or more often if you have foot problems. Report any cuts, sores, or bruises to your health care provider immediately. Contact a health care provider if:  You have an injury that is not healing.  You have cuts or breaks in the skin.  You have an ingrown nail.  You notice  redness on your legs or feet.  You feel burning or tingling in your legs or feet.  You have pain or cramps in your legs and feet.  Your legs or feet are numb.  Your feet always feel cold. Get help right away if:  There is increasing redness, swelling, or pain in or around a wound.  There is a red line that goes up your leg.  Pus is coming from a wound.  You develop a fever or as directed by your health care provider.  You notice a bad smell coming from an ulcer or wound. This information is not intended to replace advice given to you by your health care provider. Make sure you discuss any questions you have with your health care provider. Document Released: 09/23/2000 Document Revised:  03/03/2016 Document Reviewed: 03/05/2013 Elsevier Interactive Patient Education  2017 [employer].

## 2016-12-06 NOTE — Progress Notes (Signed)
Patient ID: Darrell Horne, male   DOB: November 09, 1945, 71 y.o.   MRN: [id number]   Subjective: Patient presents for follow-up for a history of infected blister/superficial ulcer on the right great toe initially under our care since the visit of 11/22/2016. Patient currently applying topical antibiotic ointment and a protective pad around the open wound on the right great toe daily     Objective:  Vascular: Pitting edema right ankle DP pulse 1/4 right PT pulse0/4 right Capillary reflex immediate bilaterally  Neurological: Sensation to 10 g monofilament wire intact 0/5 right Vibratory sensation nonreactive right Ankle reflex reactive bilaterally right  Dermatological: Atrophic skin right  10 x 53mm (previous size of the wound was 20 x 10 mm) blister medial right hallux. There is  surrounding erythema, edema, there is no active drainage or purulent drainage, malodor or warmth Small nondraining blister dorsal PIPJ second right toe. 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

## 2016-12-09 ENCOUNTER — Ambulatory Visit: Payer: Medicare HMO | Admitting: Internal Medicine

## 2016-12-14 ENCOUNTER — Encounter: Payer: Self-pay | Admitting: Podiatry

## 2016-12-14 ENCOUNTER — Ambulatory Visit (INDEPENDENT_AMBULATORY_CARE_PROVIDER_SITE_OTHER): Payer: Self-pay | Admitting: Podiatry

## 2016-12-14 VITALS — BP 114/54 | HR 90 | Resp 18

## 2016-12-14 DIAGNOSIS — L97511 Non-pressure chronic ulcer of other part of right foot limited to breakdown of skin: Secondary | ICD-10-CM

## 2016-12-14 NOTE — Patient Instructions (Signed)
Apply Silvadene cream daily to the skin ulcer on your right big toe daily or every other day Attach a foam pad around the ulcer, cover with gauze and attach with Coflex tape Limit your stand walking use wheelchair is much as possible If you develop any sudden increase in pain, swelling, redness, fever, drainage present to the emergency department   Diabetes and Foot Care Diabetes may cause you to have problems because of poor blood supply (circulation) to your feet and legs. This may cause the skin on your feet to become thinner, break easier, and heal more slowly. Your skin may become dry, and the skin may peel and crack. You may also have nerve damage in your legs and feet causing decreased feeling in them. You may not notice minor injuries to your feet that could lead to infections or more serious problems. Taking care of your feet is one of the most important things you can do for yourself. Follow these instructions at home:  Wear shoes at all times, even in the house. Do not go barefoot. Bare feet are easily injured.  Check your feet daily for blisters, cuts, and redness. If you cannot see the bottom of your feet, use a mirror or ask someone for help.  Wash your feet with warm water (do not use hot water) and mild soap. Then pat your feet and the areas between your toes until they are completely dry. Do not soak your feet as this can dry your skin.  Apply a moisturizing lotion or petroleum jelly (that does not contain alcohol and is unscented) to the skin on your feet and to dry, brittle toenails. Do not apply lotion between your toes.  Trim your toenails straight across. Do not dig under them or around the cuticle. File the edges of your nails with an emery board or nail file.  Do not cut corns or calluses or try to remove them with medicine.  Wear clean socks or stockings every day. Make sure they are not too tight. Do not wear knee-high stockings since they may decrease blood flow to  your legs.  Wear shoes that fit properly and have enough cushioning. To break in new shoes, wear them for just a few hours a day. This prevents you from injuring your feet. Always look in your shoes before you put them on to be sure there are no objects inside.  Do not cross your legs. This may decrease the blood flow to your feet.  If you find a minor scrape, cut, or break in the skin on your feet, keep it and the skin around it clean and dry. These areas may be cleansed with mild soap and water. Do not cleanse the area with peroxide, alcohol, or iodine.  When you remove an adhesive bandage, be sure not to damage the skin around it.  If you have a wound, look at it several times a day to make sure it is healing.  Do not use heating pads or hot water bottles. They may burn your skin. If you have lost feeling in your feet or legs, you may not know it is happening until it is too late.  Make sure your health care provider performs a complete foot exam at least annually or more often if you have foot problems. Report any cuts, sores, or bruises to your health care provider immediately. Contact a health care provider if:  You have an injury that is not healing.  You have cuts or breaks  in the skin.  You have an ingrown nail.  You notice redness on your legs or feet.  You feel burning or tingling in your legs or feet.  You have pain or cramps in your legs and feet.  Your legs or feet are numb.  Your feet always feel cold. Get help right away if:  There is increasing redness, swelling, or pain in or around a wound.  There is a red line that goes up your leg.  Pus is coming from a wound.  You develop a fever or as directed by your health care provider.  You notice a bad smell coming from an ulcer or wound. This information is not intended to replace advice given to you by your health care provider. Make sure you discuss any questions you have with your health care  provider. Document Released: 09/23/2000 Document Revised: 03/03/2016 Document Reviewed: 03/05/2013 Elsevier Interactive Patient Education  2017 [employer].

## 2016-12-14 NOTE — Progress Notes (Signed)
Patient ID: Darrell Horne, male   DOB: 1946/07/13, 71 y.o.   MRN: [id number]   Subjective: This patient presents for follow-up care with initial history of infected blister on the right hallux under our care since 11/22/2016. Patient has completed 7 days cephalexin and 7 days of Augmentin 870 02/08/2024. Patient denies fever, chills or any other systemic implants. He is currently applying topical antibiotic ointment and a gauze pad around a wound on the right foot daily as well as a protective pad to offload the right great toe blister/ulcer   Objective:  Vascular: Pitting edema right ankle DP pulse 2/4 right PT pulse2/4 right Capillary reflex immediate bilaterally  Neurological: Sensation to 10 g monofilament wire intact 0/5 right Vibratory sensation nonreactive right Ankle reflex reactive bilaterally right  Dermatological: Atrophic skin right  5 x 26mm (start size of the wound was 20 x 10 mm)  superficial ulcer with moist, granular base. 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

## 2016-12-14 NOTE — Progress Notes (Signed)
   Subjective:    Patient ID: Darrell Horne, male    DOB: [birth year], 71 y.o.   MRN: [id number]  HPI   I am doing better on my right big toe and when I step down and walk on it I can tell it is draining    Review of Systems  All other systems reviewed and are negative.      Objective:   Physical Exam        Assessment & Plan:

## 2016-12-21 ENCOUNTER — Encounter: Payer: Self-pay | Admitting: Podiatry

## 2016-12-21 ENCOUNTER — Ambulatory Visit (INDEPENDENT_AMBULATORY_CARE_PROVIDER_SITE_OTHER): Payer: Medicare HMO | Admitting: Podiatry

## 2016-12-21 VITALS — BP 106/56 | HR 85 | Resp 18

## 2016-12-21 DIAGNOSIS — L97511 Non-pressure chronic ulcer of other part of right foot limited to breakdown of skin: Secondary | ICD-10-CM | POA: Diagnosis not present

## 2016-12-21 NOTE — Patient Instructions (Signed)
Continue to apply topical antibiotic ointment and a protective foam pad around the skin ulcer, cover with gauze and attach with Coflex tape to the right great toe daily. Limit standing walking is much as possible use wheelchair Fewany sudden increase in pain, swelling, redness, fever, drainage present to the emergency department    Diabetes and Foot Care Diabetes may cause you to have problems because of poor blood supply (circulation) to your feet and legs. This may cause the skin on your feet to become thinner, break easier, and heal more slowly. Your skin may become dry, and the skin may peel and crack. You may also have nerve damage in your legs and feet causing decreased feeling in them. You may not notice minor injuries to your feet that could lead to infections or more serious problems. Taking care of your feet is one of the most important things you can do for yourself. Follow these instructions at home:  Wear shoes at all times, even in the house. Do not go barefoot. Bare feet are easily injured.  Check your feet daily for blisters, cuts, and redness. If you cannot see the bottom of your feet, use a mirror or ask someone for help.  Wash your feet with warm water (do not use hot water) and mild soap. Then pat your feet and the areas between your toes until they are completely dry. Do not soak your feet as this can dry your skin.  Apply a moisturizing lotion or petroleum jelly (that does not contain alcohol and is unscented) to the skin on your feet and to dry, brittle toenails. Do not apply lotion between your toes.  Trim your toenails straight across. Do not dig under them or around the cuticle. File the edges of your nails with an emery board or nail file.  Do not cut corns or calluses or try to remove them with medicine.  Wear clean socks or stockings every day. Make sure they are not too tight. Do not wear knee-high stockings since they may decrease blood flow to your legs.  Wear  shoes that fit properly and have enough cushioning. To break in new shoes, wear them for just a few hours a day. This prevents you from injuring your feet. Always look in your shoes before you put them on to be sure there are no objects inside.  Do not cross your legs. This may decrease the blood flow to your feet.  If you find a minor scrape, cut, or break in the skin on your feet, keep it and the skin around it clean and dry. These areas may be cleansed with mild soap and water. Do not cleanse the area with peroxide, alcohol, or iodine.  When you remove an adhesive bandage, be sure not to damage the skin around it.  If you have a wound, look at it several times a day to make sure it is healing.  Do not use heating pads or hot water bottles. They may burn your skin. If you have lost feeling in your feet or legs, you may not know it is happening until it is too late.  Make sure your health care provider performs a complete foot exam at least annually or more often if you have foot problems. Report any cuts, sores, or bruises to your health care provider immediately. Contact a health care provider if:  You have an injury that is not healing.  You have cuts or breaks in the skin.  You have an  ingrown nail.  You notice redness on your legs or feet.  You feel burning or tingling in your legs or feet.  You have pain or cramps in your legs and feet.  Your legs or feet are numb.  Your feet always feel cold. Get help right away if:  There is increasing redness, swelling, or pain in or around a wound.  There is a red line that goes up your leg.  Pus is coming from a wound.  You develop a fever or as directed by your health care provider.  You notice a bad smell coming from an ulcer or wound. This information is not intended to replace advice given to you by your health care provider. Make sure you discuss any questions you have with your health care provider. Document Released:  09/23/2000 Document Revised: 03/03/2016 Document Reviewed: 03/05/2013 Elsevier Interactive Patient Education  2017 [employer].

## 2016-12-21 NOTE — Progress Notes (Signed)
Patient ID: Darrell Horne, male   DOB: 1946-03-08, 71 y.o.   MRN: [id number]    Subjective: This patient presents for follow-up care with initial history of infected blister on the right hallux under our care since 11/22/2016. Patient has completed 7 days cephalexin and 7 days of Augmentin 870 02/08/2024. Patient denies fever, chills or any other systemic implants. He is currently applying topical antibiotic ointment and a gauze pad around a wound on the right foot daily as well as a protective pad to offload the right great toe blister/ulcer   Objective:  Vascular: Pitting edema right ankle DP pulse 2/4 right PT pulse2/4 right Capillary reflex immediate bilaterally  Neurological: Sensation to 10 g monofilament wire intact 0/5 right Vibratory sensation nonreactive right Ankle reflex reactive bilaterally right  Dermatological: Atrophic skin right  3 x 2 mm(start size of the wound was 20 x 10 mm) superficial ulcer with moist, granular base. There is nosurrounding erythema, edema, malodor or active drainage  Eschar dorsal second right toe without any surrounding erythema, edema, warmth or drainage  Musculoskeletal: Patient walks slowly with assistance of a cane Prosthetic limb left BK amputation left  Assessment: Diabetic with peripheral neuropathy/with a history of alcoholic neuropathy Clinically not infected diabetic superficial ulcer right hallux with significant reduction in the ulcer size  x Plan: Debrided ulcer and apply protective dressing Continue to apply topical antibiotic ointment protective gauze pad around wound on right great toe daily Instructed patient that if he knows any sudden increase in pain, swelling, redness, fever to present to the emergency department  Reappoint 14  days

## 2016-12-26 ENCOUNTER — Encounter: Payer: Self-pay | Admitting: Neurology

## 2016-12-26 ENCOUNTER — Ambulatory Visit (INDEPENDENT_AMBULATORY_CARE_PROVIDER_SITE_OTHER): Payer: Medicare HMO | Admitting: Neurology

## 2016-12-26 VITALS — BP 100/68 | HR 85 | Ht 70.0 in | Wt 267.3 lb

## 2016-12-26 DIAGNOSIS — E1142 Type 2 diabetes mellitus with diabetic polyneuropathy: Secondary | ICD-10-CM | POA: Diagnosis not present

## 2016-12-26 DIAGNOSIS — G4485 Primary stabbing headache: Secondary | ICD-10-CM

## 2016-12-26 DIAGNOSIS — R69 Illness, unspecified: Secondary | ICD-10-CM | POA: Diagnosis not present

## 2016-12-26 DIAGNOSIS — G621 Alcoholic polyneuropathy: Secondary | ICD-10-CM | POA: Diagnosis not present

## 2016-12-26 MED ORDER — TOPIRAMATE 50 MG PO TABS: 50.0000 mg | ORAL_TABLET | Freq: Every day | ORAL | 3 refills | Status: DC

## 2016-12-26 NOTE — Patient Instructions (Addendum)
1.  Increase topiramate to 50mg  at bedtime 2.  Continue amitriptyline 50mg  at bedtime.  If your dry mouth gets worse, we may need to reduce the dose  Return to clinic 6 months

## 2016-12-26 NOTE — Progress Notes (Signed)
Follow-up Visit   Date: 12/26/16    Darrell Horne MRN: [id number] DOB: [birth year]   Interim History: Darrell Horne is a 71 y.o. right-handed Caucasian male with diabetes mellitus type 2 s/p left BKA (2013), chronic low back pain, hypertension, paroxsymal atrial fibrillation (on Eliquis), and hyperlipidemia returning to the clinic for follow-up of neuropathy and headaches.  The patient was accompanied to the clinic by self.  History of present illness: Beginning around late 1990s, he started having numbness and tingling of the feet which has progressed over the years. He underwent left BKA in 2013. Currently, he has numbness of the right foot and sharp pain involving the toes. Around 2013, he began noticing numbness of the finger tips. He is dropping objects because he cannot feel things. He denies any weakness of the hands. He does not have a lot of tingling in the hands or feet. He is taking gabapentin 654m TID and amitriptyline 553mqhs. Nothing improves or exacerbates his pain. He has a 15 year history of alcoholism, but has been sober since 2013.   He also has chronic low back pain and had a MRI lumbar spine ordered by his orthopeadic physican.    UPDATE 12/23/2015:  Headaches resolved since taking topamax 2539mnd amitriptyline 66m29m bedtime.  Last headache was about 3 weeks ago and much milder than previously.  He has also noticed that his paresthesias are improved on topamax.  [facility] had one fall yesterday due to imbalance. He uses a cane and wheelchair to ambulate.  UPDATE 06/27/2016:  He was hospitalized in July for syncopal event while eating lunch, which was thought to be due to orthostasis as his BP was low on admission.  There was no seizure-like activity. He was recommended to have EP evaluation, but he decided to do additional work-up as an out-patient, but has not scheduled this.  His beta blocker was reduced and he will be  having cardiac monitoring.  My last note indicates that he was on amitriptyline 66mg66m, but he reports taking 25mg 41m  He has noticed worsening headaches since reducing his amitriptyline to 25mg, 11mis unclear why the dose was reduced from 66mg qh53mHis last QTc 423.   UPDATE 12/26/2016:  He is here for 6 month follow-up appointment  Overall, his headaches are better since increasing amitriptyline to 66mg, bu12m continues to have severe spells of sporadic and stabbing pain, lasting about 10-15 second, which can affect various parts of his head.  He has been following podiatry for right hallux ulceration, which has been improving.  Neuropathy in the hands feels like it involving more of his fingers and he needs to be more careful when holding objects.  He has noticed dry mouth, which is most likely due to amitriptyline. It is not severe enough to make any changes to his medications.   Medications:  Current Outpatient Prescriptions on File Prior to Visit  Medication Sig Dispense Refill  . amitriptyline (ELAVIL) 50 MG tablet Take 1 tablet (50 mg total) by mouth at bedtime. 90 tablet 3  . apixaban (ELIQUIS) 5 MG TABS tablet Take 1 tablet (5 mg total) by mouth 2 (two) times daily. 60 tablet 1  . atorvastatin (LIPITOR) 40 MG tablet TAKE 1 TABLET DAILY 90 tablet 1  . b complex vitamins tablet Take 1 tablet by mouth daily.    . cephALEXin (KEFLEX) 500 MG capsule Take 1  capsule (500 mg total) by mouth 3 (three) times daily. 21 capsule 1  . Cholecalciferol (VITAMIN D PO) Take 1 tablet by mouth 2 (two) times daily.    Marland Kitchen gabapentin (NEURONTIN) 600 MG tablet Take 1 tablet (600 mg total) by mouth 3 (three) times daily. 270 tablet 3  . metFORMIN (GLUCOPHAGE-XR) 500 MG 24 hr tablet Take 2 tablets (1,000 mg total) by mouth daily with supper. 180 tablet 3  . metoprolol succinate (TOPROL XL) 25 MG 24 hr tablet Take 0.5 tablets (12.5 mg total) by mouth daily. 30 tablet 0  . Polyethyl Glycol-Propyl Glycol (SYSTANE  OP) Place 1 drop into both eyes 2 (two) times daily as needed (dry eyes/ burning).    Marland Kitchen tiZANidine (ZANAFLEX) 4 MG tablet Take 1 tablet (4 mg total) by mouth every 6 (six) hours as needed for muscle spasms. 30 tablet 1  . vitamin C (ASCORBIC ACID) 500 MG tablet Take 500 mg by mouth 2 (two) times daily.      No current facility-administered medications on file prior to visit.     Allergies: No Known Allergies  Review of Systems:  CONSTITUTIONAL: No fevers, chills, night sweats, or weight loss.  EYES: No visual changes or eye pain ENT: No hearing changes.  No history of nose bleeds.   RESPIRATORY: No cough, wheezing and shortness of breath.   CARDIOVASCULAR: Negative for chest pain, and palpitations.   GI: Negative for abdominal discomfort, blood in stools or black stools.  No recent change in bowel habits.   GU:  No history of incontinence.   MUSCLOSKELETAL: +history of joint pain or swelling.  No myalgias.   SKIN: +lesions, rash, and itching.   ENDOCRINE: Negative for cold or heat intolerance, polydipsia or goiter.   PSYCH:  No depression or anxiety symptoms.   NEURO: As Above.   Vital Signs:  BP 100/68   Pulse 85   Ht 5' 10"  (1.778 m)   Wt 267 lb 5 oz (121.3 kg)   SpO2 96%   BMI 38.36 kg/m   Neurological Exam: MENTAL STATUS including orientation to time, place, person, recent and remote memory, attention span and concentration, language, and fund of knowledge is normal.  Speech is not dysarthric.  CRANIAL NERVES: Pupils equal round and reactive to light.  Normal conjugate, extra-ocular eye movements in all directions of gaze.  No ptosis.  Face is symmetric.  MOTOR:  Left BKA.  Motor strength is 5/5 upper extremities including hip flexors, except 4+/5 intrinsic hand muscles.    SENSORY:  Reduced sensation in a glove-stocking distribution.  COORDINATION/GAIT:  Finger to nose testing intact. Wide-based, moderately ataxic gait.  Data: Lab Results  Component Value Date    HGBA1C 6.5 07/26/2016   Labs 07/16/2015:  Vitamin B12 618, ESR 21, CRP 1.2, vitamin B1, copper 116  MRI/A brain 08/05/2015: 1. No intracranial explanation for headache.  No change since 2011. 2. Multi focal intracranial atherosclerosis as described above. The most proximal high-grade stenosis is at the left A1 segment. 3. No visualized carotid or vertebral artery stenosis in the neck.  IMPRESSION/PLAN: 1. Chronic tension headaches are improved, but he has new primary stabbing headaches   - Continue amitriptyline to 68m qhs.  Avoid increasing this due to dry mouth.  - Increase topirmate 534mqhs  2.  Peripheral neuropathy due to diabetes and alcoholism (sober since 2013) affecting glove-stocking distribution c/b left BKA and right toe ulceration - Symptoms manifesting predominately with numbness and explained that medications are most  helpful with painful paresthesias  - Overall, exam is stable.  Explained that the natural progression of neuropathy is slowly worsening symptoms.   - Praised him for keeping tight glycemic control - Unable to increase gabapentin due to him feeling sleepy on 6105m TID  3.  Multifactorial gait abnormality due to neuropathy, low back pain, and left BKA  Return to clinic in 6 months   The duration of this appointment visit was 25 minutes of face-to-face time with the patient.  Greater than 50% of this time was spent in counseling, explanation of diagnosis, planning of further management, and coordination of care.   Thank you for allowing me to participate in patient's care.  If I can answer any additional questions, I would be pleased to do so.    Sincerely,    Shaheer Bonfield K. PPosey Pronto DO

## 2017-01-05 ENCOUNTER — Encounter: Payer: Self-pay | Admitting: Podiatry

## 2017-01-05 ENCOUNTER — Ambulatory Visit (INDEPENDENT_AMBULATORY_CARE_PROVIDER_SITE_OTHER): Payer: Medicare HMO | Admitting: Podiatry

## 2017-01-05 DIAGNOSIS — L97511 Non-pressure chronic ulcer of other part of right foot limited to breakdown of skin: Secondary | ICD-10-CM

## 2017-01-06 NOTE — Progress Notes (Signed)
Subjective: 71 year old male presents the office they for follow-up evaluation of a wound on the right big toe which started from had an infected blister. His been under care of Dr. Amalia Hailey since 11/22/2016. He has completed his course of antibiotics. He's been continuing daily dressing changes as directed by Dr.. He denies any swelling or redness or drainage or pus. Overall he feels that he is doing much better. Denies any systemic complaints such as fevers, chills, nausea, vomiting. No acute changes since last appointment, and no other complaints at this time.   Objective: AAO x3, NAD DP/PT pulses palpable bilaterally, CRT less than 3 seconds There is superficial wound is present on the right hallux on the area previous wound. There is warm a scab overlying this area. Upon debridement there is a very small pinpoint opening. There is no drainage or pus in there is no probing, undermining or timely. This at [facility] to be a very superficial small pinpoint lesion at this time. There is no swelling erythema, ascending synovitis. There is no fluctuance or crepitus or malodor. There is no tenderness to palpation the area at this time. No open lesions or pre-ulcerative lesions.  No pain with calf compression, swelling, warmth, erythema  Assessment: Healing wound right foot without signs of infection today.  Plan: -All treatment options discussed with the patient including all alternatives, risks, complications.  -Sharply debrided the wound today without any complications or bleeding. The wound appears to be healing well and is very small superficial. Recommend continue small amount in about ointment dressing and change the bandage daily. I'll have him follow-up next couple weeks to make sure this area has healed. Monitor for any clinical signs or symptoms of infection and directed to call the office immediately should any occur or go to the ER. -Patient encouraged to call the office with any questions,  concerns, change in symptoms.   Celesta Gentile, DPM

## 2017-01-09 ENCOUNTER — Other Ambulatory Visit: Payer: Self-pay | Admitting: Internal Medicine

## 2017-01-24 ENCOUNTER — Encounter: Payer: Self-pay | Admitting: Endocrinology

## 2017-01-24 ENCOUNTER — Ambulatory Visit (INDEPENDENT_AMBULATORY_CARE_PROVIDER_SITE_OTHER): Payer: Medicare HMO | Admitting: Endocrinology

## 2017-01-24 VITALS — BP 122/84 | HR 93 | Ht 70.0 in | Wt 264.0 lb

## 2017-01-24 DIAGNOSIS — E119 Type 2 diabetes mellitus without complications: Secondary | ICD-10-CM | POA: Diagnosis not present

## 2017-01-24 NOTE — Patient Instructions (Addendum)
check your blood sugar once a day.  vary the time of day when you check, between before the 3 meals, and at bedtime.  also check if you have symptoms of your blood sugar being too high or too low.  please keep a record of the readings and bring it to your next appointment here.  You can write it on any piece of paper.  please call us sooner if your blood sugar goes below 70, or if you have a lot of readings over 200.   Please continue the same metformin.  Keep the scrape on the right big toe covered with antibiotic ointment and a bandaid.  Call if it gets worse.   Please come back for a follow-up appointment in 6 months.

## 2017-01-24 NOTE — Progress Notes (Signed)
Subjective:    Patient ID: Darrell Horne, male    DOB: Aug 23, 1946, 71 y.o.   MRN: [id number]  HPI Pt returns for f/u of diabetes mellitus: DM type: 2 Dx'ed: 7867 Complications: severe painful neuropathy of all 4 extremities, and left BKA.   Therapy: metformin. DKA: never.  Severe hypoglycemia: never.  Pancreatitis: never.  Other: he has never been on insulin; he declines Januvia; edema limits rx options.   Interval history: no cbg record, but states cbg's are well-controlled.  pt states he feels well in general.   Past Medical History:  Diagnosis Date  . Chest pain   . Diabetes mellitus   . DM2 (diabetes mellitus, type 2) ([place])   . GERD (gastroesophageal reflux disease)   . HLD (hyperlipidemia)   . HTN (hypertension)   . PAF (paroxysmal atrial fibrillation) ([place])   . Palpitations     Past Surgical History:  Procedure Laterality Date  . APPENDECTOMY    . CHOLECYSTECTOMY    . COLONOSCOPY     Dr. Michail Sermon  . HERNIA REPAIR    . LEG AMPUTATION Left [id number]   Dr. Kendell Bane  . SHOULDER SURGERY  [id number]   Dr. Meridee Score  . TOTAL ANKLE ARTHROPLASTY  [id number]    Social History   Social History  . Marital status: Divorced    Spouse name: N/A  . Number of children: 4  . Years of education: N/A   Occupational History  . retired Administrator    Social History Main Topics  . Smoking status: Never Smoker  . Smokeless tobacco: Never Used  . Alcohol use No     Comment: Previously drinking a fifth of liquor every two days x 13 years  . Drug use: No  . Sexual activity: No   Other Topics Concern  . Not on file   Social History Narrative   Diet:   Do you drink/eat things with caffeine?  Yes   Marital status:  Divorced.  What year were you married?  2001   Do you live in a house, assisted living, condo, apartment, trailer, etc.?  Apartment   Is it one or more stories?  1 story   How many persons live in your home?  1   Do you have any pets in your home?  No   Current or past profession:  Truck driver   Do you exercise?  Some  Type and how often:  Walking    Current Outpatient Prescriptions on File Prior to Visit  Medication Sig Dispense Refill  . amitriptyline (ELAVIL) 50 MG tablet Take 1 tablet (50 mg total) by mouth at bedtime. 90 tablet 3  . atorvastatin (LIPITOR) 40 MG tablet TAKE 1 TABLET DAILY 90 tablet 1  . b complex vitamins tablet Take 1 tablet by mouth daily.    . Cholecalciferol (VITAMIN D PO) Take 1 tablet by mouth 2 (two) times daily.    Marland Kitchen ELIQUIS 5 MG TABS tablet TAKE 1 TABLET TWICE A DAY 180 tablet 1  . gabapentin (NEURONTIN) 600 MG tablet Take 1 tablet (600 mg total) by mouth 3 (three) times daily. 270 tablet 3  . metFORMIN (GLUCOPHAGE-XR) 500 MG 24 hr tablet Take 2 tablets (1,000 mg total) by mouth daily with supper. 180 tablet 3  . metoprolol succinate (TOPROL-XL) 50 MG 24 hr tablet TAKE 1 TABLET DAILY WITH ORIMMEDIATELY FOLLOWING A    MEAL 90 tablet 1  . Polyethyl Glycol-Propyl Glycol (SYSTANE OP) Place 1 drop into  both eyes 2 (two) times daily as needed (dry eyes/ burning).    Marland Kitchen tiZANidine (ZANAFLEX) 4 MG tablet Take 1 tablet (4 mg total) by mouth every 6 (six) hours as needed for muscle spasms. 30 tablet 1  . topiramate (TOPAMAX) 50 MG tablet Take 1 tablet (50 mg total) by mouth daily. 90 tablet 3  . vitamin C (ASCORBIC ACID) 500 MG tablet Take 500 mg by mouth 2 (two) times daily.      No current facility-administered medications on file prior to visit.     No Known Allergies  Family History  Problem Relation Age of Onset  . Heart disease Mother     Living  . Stroke Father 73    Deceased  . Diabetes Sister   . Cancer Sister    BP 122/84   Pulse 93   Ht 5\' 10"  (1.778 m)   Wt 264 lb (119.7 kg)   SpO2 99%   BMI 37.88 kg/m   Review of Systems He denies hypoglycemia.      Objective:   Physical Exam VITAL SIGNS:  See vs page.  GENERAL: no distress.  In wheelchair.   Ext: left BKA.   Pulses: right dorsalis  pedis intact.  RLE: no deformity. 1+ leg edema.   Skin: there is a few mm abrasion on the lat aspect of the right great toe: normal color and temp of the right foot: healed surgical scar over the right medial malleolus (fx repair).  Neuro: sensation is intact to touch on the right foot, but decreased from normal.  Ext: There is onychomycosis of the right toenails.   Lab Results  Component Value Date   CREATININE 0.86 11/11/2016   BUN 12 11/11/2016   NA 139 11/11/2016   K 4.7 11/11/2016   CL 107 11/11/2016   CO2 24 11/11/2016   Lab Results  Component Value Date   HGBA1C 6.5 07/26/2016      Assessment & Plan:  Abrasion, new Type 2 DM: well-controlled.  Please continue the same medication.   Patient Instructions  check your blood sugar once a day.  vary the time of day when you check, between before the 3 meals, and at bedtime.  also check if you have symptoms of your blood sugar being too high or too low.  please keep a record of the readings and bring it to your next appointment here.  You can write it on any piece of paper.  please call us sooner if your blood sugar goes below 70, or if you have a lot of readings over 200.   Please continue the same metformin.  Keep the scrape on the right big toe covered with antibiotic ointment and a bandaid.  Call if it gets worse.   Please come back for a follow-up appointment in 6 months.

## 2017-02-01 ENCOUNTER — Ambulatory Visit (INDEPENDENT_AMBULATORY_CARE_PROVIDER_SITE_OTHER): Payer: Medicare HMO | Admitting: Podiatry

## 2017-02-01 ENCOUNTER — Encounter: Payer: Self-pay | Admitting: Podiatry

## 2017-02-01 DIAGNOSIS — L97511 Non-pressure chronic ulcer of other part of right foot limited to breakdown of skin: Secondary | ICD-10-CM | POA: Diagnosis not present

## 2017-02-03 NOTE — Progress Notes (Signed)
   HPI: Patient is a 71 year old male presenting today for follow-up evaluation of a wound to the right great toe. He has been under the care of Dr. Amalia Hailey since to 11/22/16. He states the area is much better and has resolved.    Physical Exam: General: The patient is alert and oriented x3 in no acute distress.  Dermatology: Skin is warm, dry and supple bilateral lower extremities. Negative for open lesions or macerations.  Vascular: Palpable pedal pulses bilaterally. No edema or erythema noted. Capillary refill within normal limits.  Neurological: Epicritic and protective threshold grossly intact bilaterally.   Musculoskeletal Exam: Range of motion within normal limits to all pedal and ankle joints bilateral. Muscle strength 5/5 in all groups bilateral.    Assessment: 1. Right great toe ulcer-healed   Plan of Care:  1. Patient was evaluated. 2. Return to clinic in 3 months with Dr. Amalia Hailey or Prudence Davidson for routine care.   Edrick Kins, DPM [facility]  Dr. Edrick Kins, [address]                                        [place], [place] [address]                Office [phone]  Fax [phone]

## 2017-02-05 IMAGING — MR MR LUMBAR SPINE W/O CM
4 of 5 series · 25 of 48 positions shown · non-contrast
Comparison: CT Abdomen and Pelvis 07/08/2014.

CLINICAL DATA: 68-year-old male with lumbar back pain radiating to
the left hip for 3 years. Left lower extremity amputation 3 years
ago. Query spinal stenosis. Initial encounter.

EXAM:
MRI LUMBAR SPINE WITHOUT CONTRAST
TECHNIQUE: Multiplanar, multisequence MR imaging of the lumbar spine was
performed. No intravenous contrast was administered.

[Series 4: T1 · sagittal · 4.0mm · 0.59mm/px · 6 of 14 slices shown (1 of 2)]
[im 1/14]
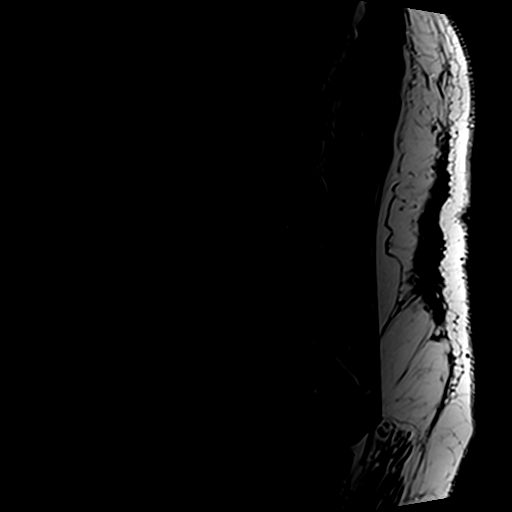
[im 3/14]
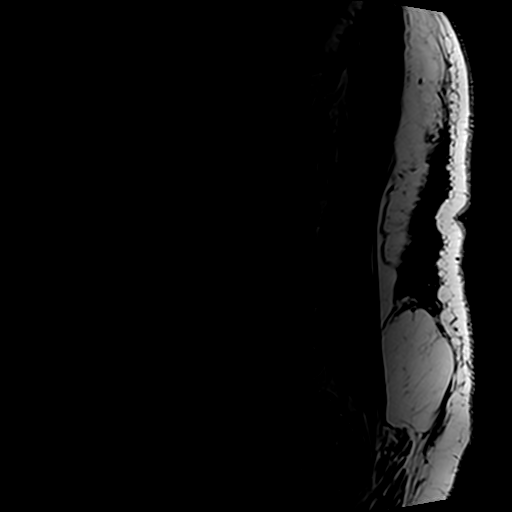
[im 6/14]
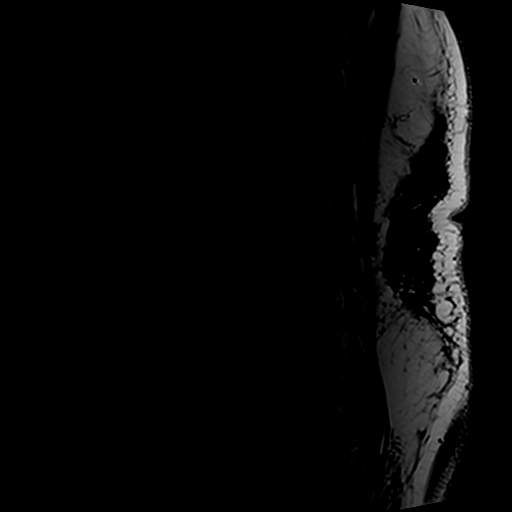
[im 8/14]
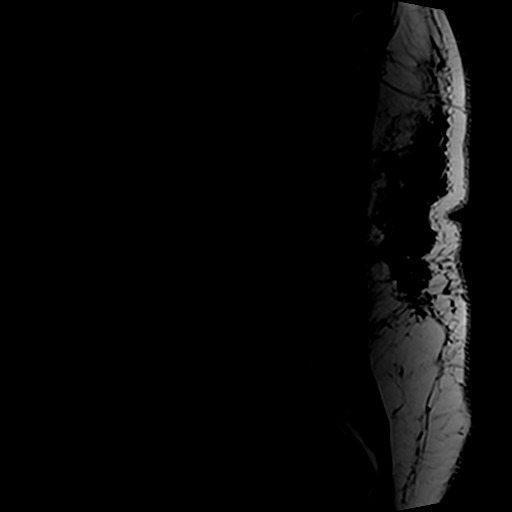
[im 11/14]
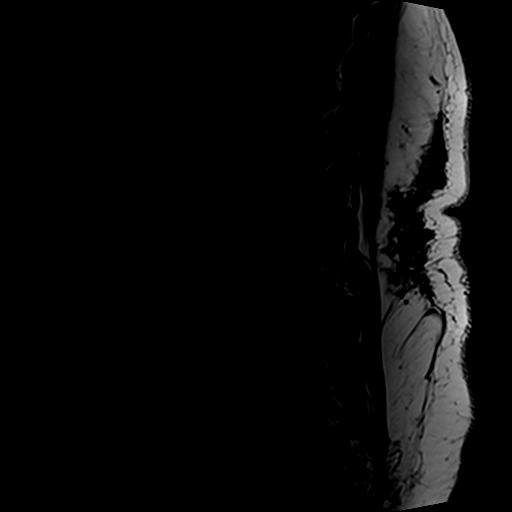
[im 14/14]
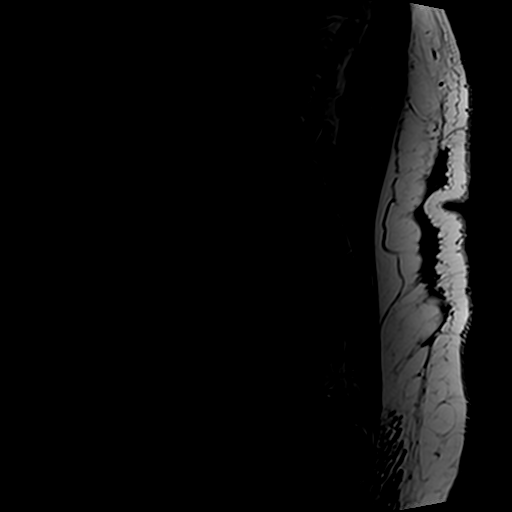

[Series 5: T2 post-contrast · sagittal · 4.0mm · 0.59mm/px · 6 of 14 slices shown]
[im 1/14]
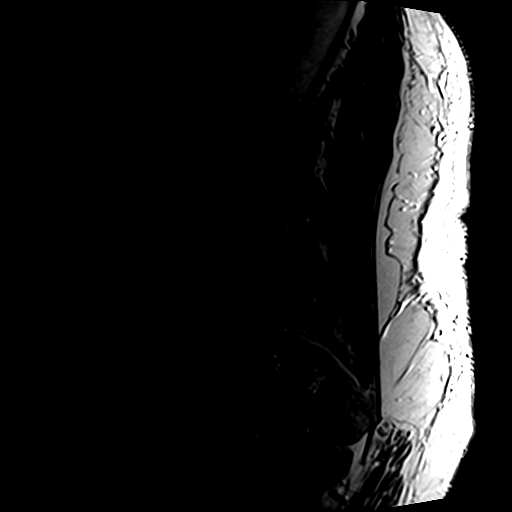
[im 3/14]
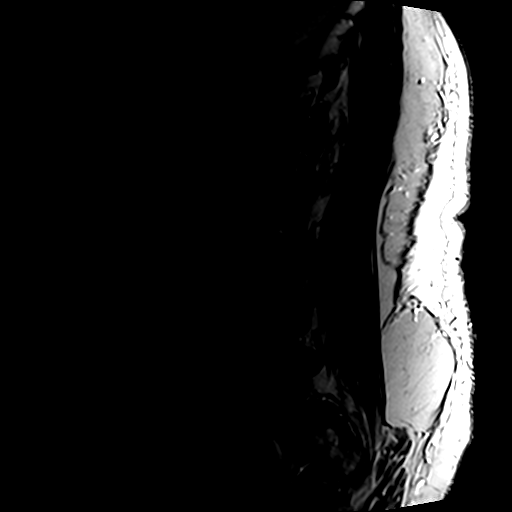
[im 6/14]
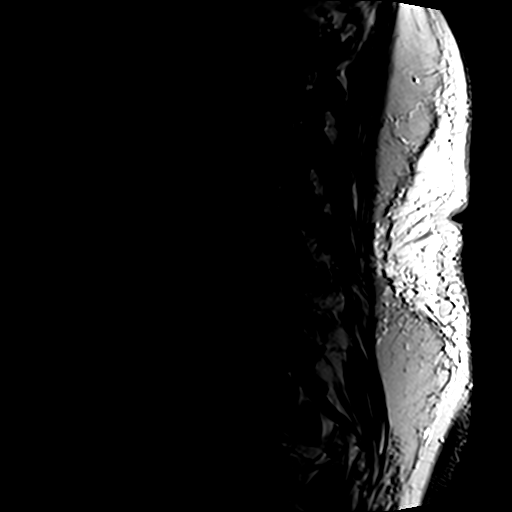
[im 8/14]
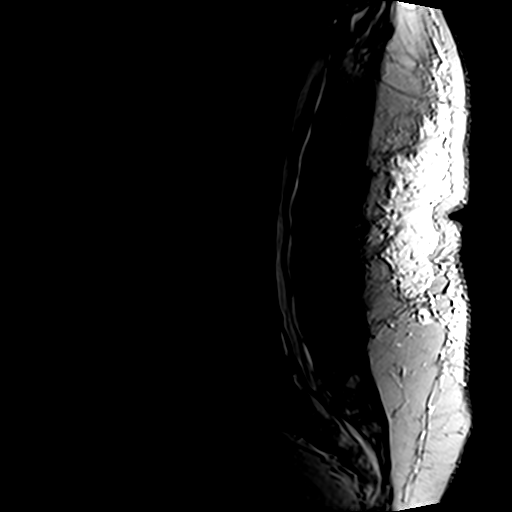
[im 11/14]
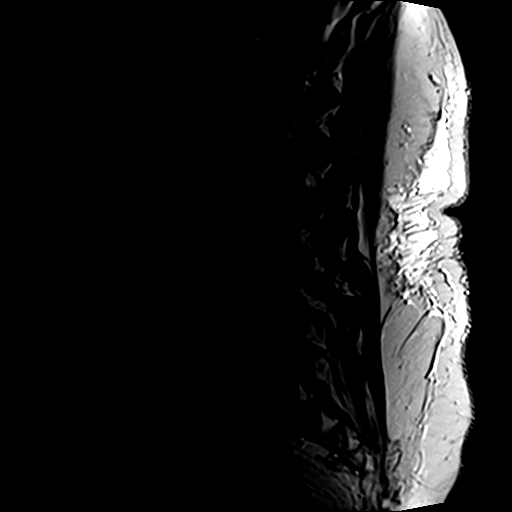
[im 14/14]
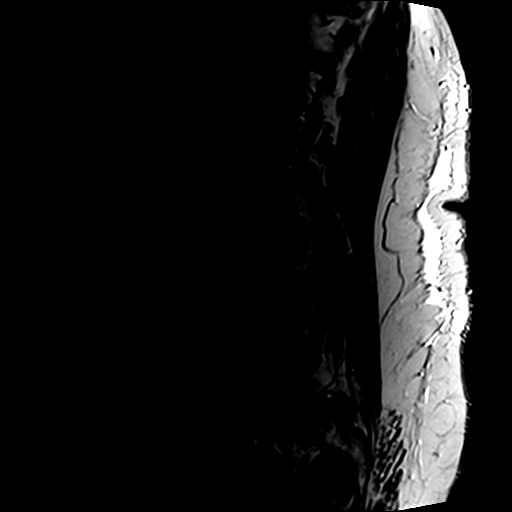

[Series 7: T1 · axial · 4.0mm · 0.35mm/px · z∈[-18,+139]mm · 4 of 33 slices shown (2 of 2)]
[im 1/33]
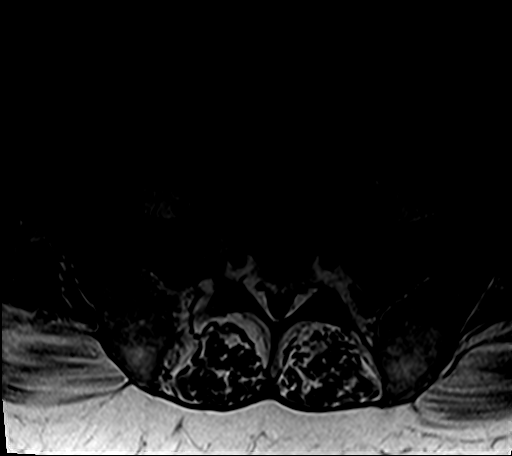
[im 5/33]
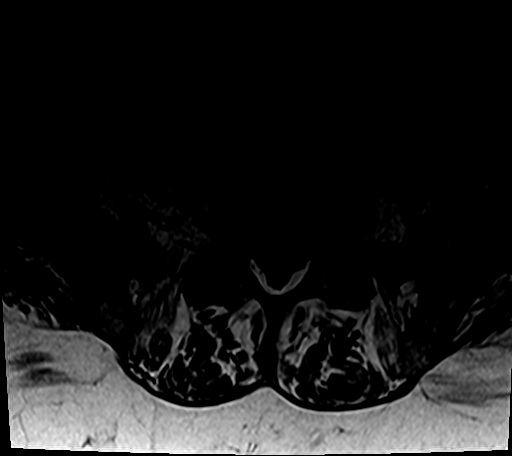
[im 17/33]
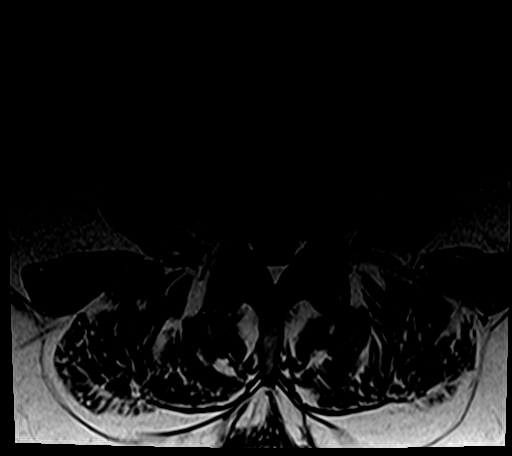
[im 28/33]
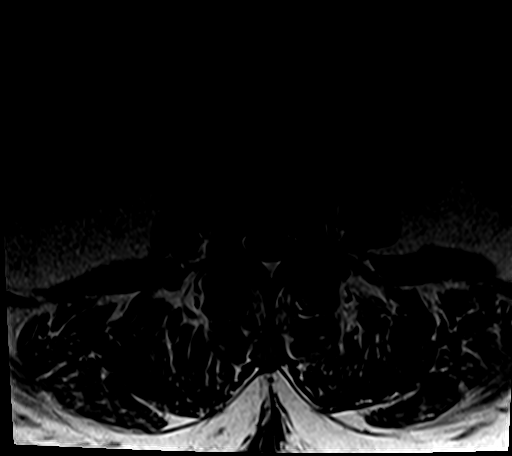

[Series 8: T2 · axial · 4.0mm · 0.70mm/px · z∈[-18,+163]mm · 9 of 33 slices shown]
[im 1/33]
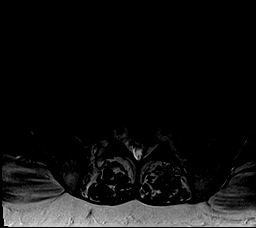
[im 5/33]
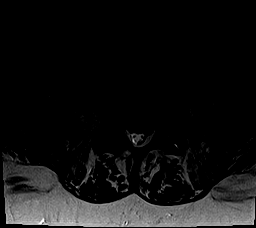
[im 10/33]
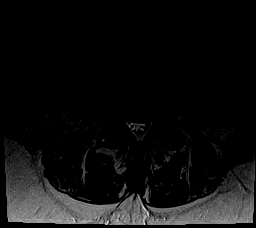
[im 14/33]
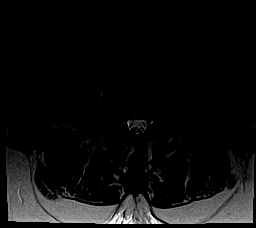
[im 17/33]
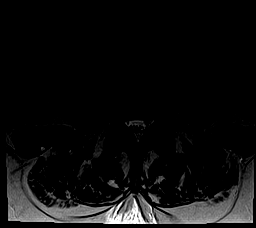
[im 19/33]
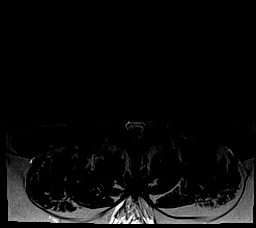
[im 23/33]
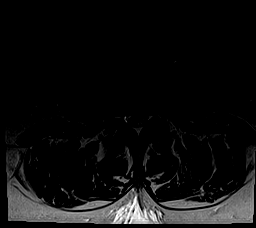
[im 28/33]
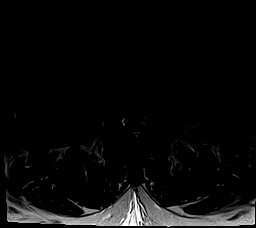
[im 33/33]
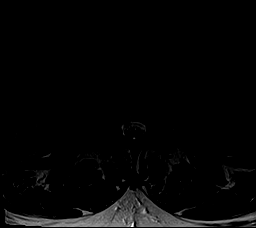

[25 of 48 positions shown; findings below may reference images not displayed]

FINDINGS: Large body habitus. Normal lumbar segmentation demonstrated on the
comparison. Stable vertebral height and alignment. Advanced chronic
disc and endplate degeneration at L5-S1. There is mild associated
endplate edema. There is mild edema also at the L3 inferior endplate
associated with a Schmorl node which appears to be increased since
5866 (series 4, image 8). Mild marrow edema anteriorly superiorly at
L2 also appears degenerative in nature. Visible sacrum intact. No
acute osseous abnormality identified.

No signal abnormality in the visualized lower thoracic spinal cord
or conus. Tip of the conus is at L1-L2.

Stable visualized abdominal viscera.

There is a degree of congenital spinal canal narrowing diffusely.
The following superimposed degenerative changes are noted:

T11-T12: Circumferential disc bulge. Mild facet hypertrophy.
Borderline to mild spinal stenosis. Mild right T11 foraminal
stenosis.

T12-L1:  Negative.

L1-L2: Disc space loss with broad-based left paracentral disc
protrusion best seen on series 8, images 4 and 5. Moderate facet and
mild ligament flavum hypertrophy. Mild epidural lipomatosis. Mild
spinal stenosis. Moderate left lateral recess stenosis. Mild L1
foraminal stenosis.

L2-L3: Circumferential disc bulge and mild to moderate facet and
ligament flavum hypertrophy. Mild epidural lipomatosis. Mild spinal
and right L2 foraminal stenosis.

L3-L4: Mild disc bulge. Mild facet and ligament flavum hypertrophy.
Mild epidural lipomatosis. Borderline spinal and mild bilateral L3
foraminal stenosis.

L4-L5: Circumferential disc bulge. Moderate facet hypertrophy.
Epidural lipomatosis. Borderline spinal stenosis. Mild to moderate
L4 foraminal stenosis.

L5-S1: Chronic severe disc space loss. Vacuum disc at this level in
5866. Chronic circumferential disc osteophyte complex. Epidural
lipomatosis. Mild facet hypertrophy. Mild spinal and lateral recess
stenosis. Moderate to severe L5 foraminal stenosis is chronic and
stable.
IMPRESSION: 1. Combined congenital and acquired mild lumbar spinal stenosis.
There is a left paracentral disc herniation at L1-L2 contributing to
moderate left lateral recess stenosis at the descending left L2
nerve root level.
2. Chronic advanced disc and endplate degeneration at L5-S1 with
stable moderate to severe foraminal stenosis.
3. Degenerative L3 inferior endplate Schmorl node appears increased
since [DATE].

## 2017-03-10 ENCOUNTER — Ambulatory Visit (INDEPENDENT_AMBULATORY_CARE_PROVIDER_SITE_OTHER): Payer: Medicare HMO | Admitting: Podiatry

## 2017-03-10 ENCOUNTER — Encounter: Payer: Self-pay | Admitting: Podiatry

## 2017-03-10 DIAGNOSIS — B351 Tinea unguium: Secondary | ICD-10-CM

## 2017-03-10 DIAGNOSIS — L089 Local infection of the skin and subcutaneous tissue, unspecified: Secondary | ICD-10-CM

## 2017-03-10 DIAGNOSIS — S90821A Blister (nonthermal), right foot, initial encounter: Principal | ICD-10-CM

## 2017-03-10 DIAGNOSIS — M79676 Pain in unspecified toe(s): Secondary | ICD-10-CM | POA: Diagnosis not present

## 2017-03-10 DIAGNOSIS — M79671 Pain in right foot: Secondary | ICD-10-CM

## 2017-03-10 NOTE — Progress Notes (Signed)
Subjective:    Patient ID: Darrell Horne, male   DOB: 71 y.o.   MRN: [id number]   HPI patient presents with breakdown of the second digit right that is localized in nature with nail disease 1-5 also noted right    ROS      Objective:  Physical Exam no change neurovascular status right with history of loss of left leg with slight breakdown of the dorsal right second digit localized to the skin with no deep tissue formation and no proximal edema erythema drainage with nail disease 1-5 right that are thick yellow and incurvated     Assessment:    Abrasion of the right second toe that is localized in nature with no indication of ulceration with mycotic nail infection     Plan:    H&P condition reviewed and debrided nailbeds 1-5 right and went ahead and applied medication to the right second digit and applied padding to take pressure off the toe. Reappoint for routine care or earlier if any drainage or any other changes were to occur and he does understand ultimately amputation may be necessary if it does not heal

## 2017-03-21 DIAGNOSIS — H43811 Vitreous degeneration, right eye: Secondary | ICD-10-CM | POA: Diagnosis not present

## 2017-03-21 DIAGNOSIS — E119 Type 2 diabetes mellitus without complications: Secondary | ICD-10-CM | POA: Diagnosis not present

## 2017-03-21 DIAGNOSIS — H353111 Nonexudative age-related macular degeneration, right eye, early dry stage: Secondary | ICD-10-CM | POA: Diagnosis not present

## 2017-03-21 DIAGNOSIS — H2512 Age-related nuclear cataract, left eye: Secondary | ICD-10-CM | POA: Diagnosis not present

## 2017-03-21 DIAGNOSIS — H353121 Nonexudative age-related macular degeneration, left eye, early dry stage: Secondary | ICD-10-CM | POA: Diagnosis not present

## 2017-03-28 ENCOUNTER — Other Ambulatory Visit: Payer: Self-pay | Admitting: Internal Medicine

## 2017-04-03 ENCOUNTER — Other Ambulatory Visit: Payer: Self-pay | Admitting: *Deleted

## 2017-04-03 DIAGNOSIS — E1142 Type 2 diabetes mellitus with diabetic polyneuropathy: Secondary | ICD-10-CM

## 2017-04-03 MED ORDER — GABAPENTIN 600 MG PO TABS: 600.0000 mg | ORAL_TABLET | Freq: Three times a day (TID) | ORAL | 0 refills | Status: DC

## 2017-04-03 NOTE — Telephone Encounter (Signed)
Patient requested refill. Scheduled follow up appointment with Dr. Eulas Post.

## 2017-04-07 ENCOUNTER — Ambulatory Visit (INDEPENDENT_AMBULATORY_CARE_PROVIDER_SITE_OTHER): Payer: Medicare HMO | Admitting: Internal Medicine

## 2017-04-07 ENCOUNTER — Encounter: Payer: Self-pay | Admitting: Internal Medicine

## 2017-04-07 VITALS — BP 110/72 | HR 64 | Temp 97.6°F | Ht 70.0 in | Wt 260.6 lb

## 2017-04-07 DIAGNOSIS — R51 Headache: Secondary | ICD-10-CM | POA: Diagnosis not present

## 2017-04-07 DIAGNOSIS — E1142 Type 2 diabetes mellitus with diabetic polyneuropathy: Secondary | ICD-10-CM | POA: Diagnosis not present

## 2017-04-07 DIAGNOSIS — K5900 Constipation, unspecified: Secondary | ICD-10-CM | POA: Diagnosis not present

## 2017-04-07 DIAGNOSIS — R519 Headache, unspecified: Secondary | ICD-10-CM

## 2017-04-07 DIAGNOSIS — I10 Essential (primary) hypertension: Secondary | ICD-10-CM

## 2017-04-07 DIAGNOSIS — M545 Low back pain: Secondary | ICD-10-CM

## 2017-04-07 DIAGNOSIS — G8929 Other chronic pain: Secondary | ICD-10-CM

## 2017-04-07 DIAGNOSIS — I480 Paroxysmal atrial fibrillation: Secondary | ICD-10-CM

## 2017-04-07 DIAGNOSIS — G4700 Insomnia, unspecified: Secondary | ICD-10-CM

## 2017-04-07 DIAGNOSIS — K649 Unspecified hemorrhoids: Secondary | ICD-10-CM | POA: Diagnosis not present

## 2017-04-07 DIAGNOSIS — Z89512 Acquired absence of left leg below knee: Secondary | ICD-10-CM | POA: Diagnosis not present

## 2017-04-07 DIAGNOSIS — E782 Mixed hyperlipidemia: Secondary | ICD-10-CM

## 2017-04-07 LAB — LIPID PANEL
Cholesterol: 116 mg/dL (ref <200)
HDL: 60 mg/dL (ref >40)
LDL Cholesterol: 43 mg/dL (ref <100)
Total CHOL/HDL Ratio: 1.9 Ratio (ref <5.0)
Triglycerides: 66 mg/dL (ref <150)
VLDL: 13 mg/dL (ref <30)

## 2017-04-07 LAB — BASIC METABOLIC PANEL WITH GFR
BUN: 12 mg/dL (ref 7–25)
CO2: 21 mmol/L (ref 20–31)
Calcium: 8.6 mg/dL (ref 8.6–10.3)
Chloride: 107 mmol/L (ref 98–110)
Creat: 0.9 mg/dL (ref 0.70–1.18)
GFR, Est African American: >89 mL/min (ref >=60)
GFR, Est Non African American: 86 mL/min (ref >=60)
Glucose, Bld: 94 mg/dL (ref 65–99)
Potassium: 4.1 mmol/L (ref 3.5–5.3)
Sodium: 138 mmol/L (ref 135–146)

## 2017-04-07 LAB — ALT: ALT: 11 U/L (ref 9–46)

## 2017-04-07 NOTE — Progress Notes (Signed)
Patient ID: Darrell Horne, male   DOB: December 26, 1945, 71 y.o.   MRN: [id number]    Location:  [facility] Place of Service: OFFICE  Chief Complaint  Patient presents with  . Medical Management of Chronic Issues    Routine Visit    HPI:  71 yo male seen today for f/u. He saw Dr Loanne Drilling in Apr 2018 and no changes made to diabetic regimen. He has constipation and has BM 1x per week for at least 6 weeks. He notes BRBPR and straining. He has tried nothing OTC. No abdominal pain, N/V. No f/c. No rectal pain. Last colonoscopy in 08/2014.   DM2 - controlled on metformin. Has neuropathy that is uncontrolled and takes gabapentin 1259m per day and elavil. 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 He wears a prosthesis. Uses a cane most times but does have a walker when anticipating prolonged standing/walking. Followed by Endo Dr ELoanne Drilling CBGs 110- 140s. No low BS reactions. He saw podiatry in Apr 2018. He was tx for right geat toe ulcer which has healed. A1c 6.5%. Last eye exam by Dr RZadie Rhinein June 2018 showed no diabetic eye changes. Urine micro/Cr ratio <5.8.  HTN - BP controlled on metoprolol  Hyperlipidemia - stable on lipitor. LDL 35  PAF - rate controlled on metoprolol. Takes eliquis for anticoagulation. Followed by cardio Dr ROval Linsey Underwent lexiscan myoview in 06/2015 which was nml. 2D echo showed moderate diastolic dysfunction with nml EF. Last ECG in 06/2015 showed 1st degree AVB and LBBB. Previous cardio Dr at [facility] Rarely has palpitations  HA - stable on topamax. Chronic intractable. Followed by neurology Dr PPosey Pronto He was supposed to stop elavil but resumed it as his neuropathy worsened.   Insomnia - stable on elavil  Chronic LBP - pain is well controlled. Now off norco and zanaflex. Followed by Ortho Dr DSharol Given Of note, he underwent left rotator cuff repair in Dec 2016.  Memory loss - he still does not drive. MMSE 29/30 in 11/11/16   Past Medical History:  Diagnosis Date  .  Chest pain   . Diabetes mellitus   . DM2 (diabetes mellitus, type 2) ([facility]   . GERD (gastroesophageal reflux disease)   . HLD (hyperlipidemia)   . HTN (hypertension)   . PAF (paroxysmal atrial fibrillation) ([facility]   . Palpitations     Past Surgical History:  Procedure Laterality Date  . APPENDECTOMY    . CHOLECYSTECTOMY    . COLONOSCOPY     Dr. SMichail Sermon . HERNIA REPAIR    . LEG AMPUTATION Left [id number]  Dr. RKendell Bane . SHOULDER SURGERY  [id number]  Dr. MMeridee Score . TOTAL ANKLE ARTHROPLASTY  [id number]   Patient Care Team: CGildardo Cranker DO as PCP - General (Internal Medicine)  Social History   Social History  . Marital status: Divorced    Spouse name: N/A  . Number of children: 4  . Years of education: N/A   Occupational History  . retired tAdministrator   Social History Main Topics  . Smoking status: Never Smoker  . Smokeless tobacco: Never Used  . Alcohol use No     Comment: Previously drinking a fifth of liquor every two days x 13 years  . Drug use: No  . Sexual activity: No   Other Topics Concern  . Not on file   Social History Narrative   Diet:   Do you drink/eat things with caffeine?  Yes  Marital status:  Divorced.  What year were you married?  2001   Do you live in a house, assisted living, condo, apartment, trailer, etc.?  Apartment   Is it one or more stories?  1 story   How many persons live in your home?  1   Do you have any pets in your home?  No   Current or past profession:  Truck driver   Do you exercise?  Some  Type and how often:  Walking     reports that he has never smoked. He has never used smokeless tobacco. He reports that he does not drink alcohol or use drugs.  Family History  Problem Relation Age of Onset  . Heart disease Mother        Living  . Stroke Father 14       Deceased  . Diabetes Sister   . Cancer Sister    Family Status  Relation Status  . Mother Deceased at age 46  . Father Deceased  . Sister  Alive  . Brother Alive  . Daughter Alive  . Daughter Alive  . Daughter Alive  . Son Alive  . Sister Alive  . Sister Deceased       Throat Cancer     No Known Allergies  Medications: Patient's Medications  New Prescriptions   No medications on file  Previous Medications   AMITRIPTYLINE (ELAVIL) 50 MG TABLET    Take 1 tablet (50 mg total) by mouth at bedtime.   ATORVASTATIN (LIPITOR) 40 MG TABLET    TAKE 1 TABLET DAILY   B COMPLEX VITAMINS TABLET    Take 1 tablet by mouth daily.   ELIQUIS 5 MG TABS TABLET    TAKE 1 TABLET TWICE A DAY   GABAPENTIN (NEURONTIN) 600 MG TABLET    Take 1 tablet (600 mg total) by mouth 3 (three) times daily.   METFORMIN (GLUCOPHAGE-XR) 500 MG 24 HR TABLET    Take 2 tablets (1,000 mg total) by mouth daily with supper.   METOPROLOL SUCCINATE (TOPROL-XL) 50 MG 24 HR TABLET    TAKE 1 TABLET DAILY WITH ORIMMEDIATELY FOLLOWING A    MEAL   POLYETHYL GLYCOL-PROPYL GLYCOL (SYSTANE OP)    Place 1 drop into both eyes 2 (two) times daily as needed (dry eyes/ burning).   TOPIRAMATE (TOPAMAX) 50 MG TABLET    Take 1 tablet (50 mg total) by mouth daily.   VITAMIN C (ASCORBIC ACID) 500 MG TABLET    Take 500 mg by mouth 2 (two) times daily.   Modified Medications   No medications on file  Discontinued Medications   CHOLECALCIFEROL (VITAMIN D PO)    Take 1 tablet by mouth 2 (two) times daily.   TIZANIDINE (ZANAFLEX) 4 MG TABLET    Take 1 tablet (4 mg total) by mouth every 6 (six) hours as needed for muscle spasms.    Review of Systems  Unable to perform ROS: Other (memory loss)    Vitals:   04/07/17 0956  BP: 110/72  Pulse: 64  Temp: 97.6 F (36.4 C)  TempSrc: Oral  SpO2: 95%  Weight: 260 lb 9.6 oz (118.2 kg)  Height: '5\' 10"'$  (1.778 m)   Body mass index is 37.39 kg/m.  Physical Exam  Constitutional: He appears well-developed and well-nourished.  HENT:  Mouth/Throat: Oropharynx is clear and moist.  Eyes: Pupils are equal, round, and reactive to light. No  scleral icterus.  Neck: Neck supple. Carotid bruit is not present.  Cardiovascular: Normal rate, regular rhythm and intact distal pulses.  Exam reveals no gallop and no friction rub.   Murmur heard.  Systolic murmur is present with a grade of 1/6  Pulses:      Left dorsalis pedis pulse not accessible.       Left posterior tibial pulse not accessible.  Left BKA. No RLE edema or calf TTP  Pulmonary/Chest: Effort normal and breath sounds normal. He has no wheezes. He has no rales. He exhibits no tenderness.  Abdominal: Soft. Bowel sounds are normal. He exhibits distension. He exhibits no abdominal bruit, no pulsatile midline mass and no mass. There is no tenderness. There is no rebound and no guarding.  Genitourinary: Rectal exam shows internal hemorrhoid. Rectal exam shows no external hemorrhoid, no fissure, no mass, no tenderness, anal tone normal and guaiac negative stool. Prostate is enlarged. Prostate is not tender.  Musculoskeletal: He exhibits edema and tenderness.  Left BKA with prosthesis intact  Lymphadenopathy:    He has no cervical adenopathy.  Neurological: He is alert.  Skin: Skin is warm and dry. No rash noted.  Psychiatric: He has a normal mood and affect. His behavior is normal.     Labs reviewed: No visits with results within 3 Month(s) from this visit.  Latest known visit with results is:  Office Visit on 11/11/2016  Component Date Value Ref Range Status  . Sodium 11/11/2016 139  135 - 146 mmol/L Final  . Potassium 11/11/2016 4.7  3.5 - 5.3 mmol/L Final  . Chloride 11/11/2016 107  98 - 110 mmol/L Final  . CO2 11/11/2016 24  20 - 31 mmol/L Final  . Glucose, Bld 11/11/2016 104* 65 - 99 mg/dL Final  . BUN 11/11/2016 12  7 - 25 mg/dL Final  . Creat 11/11/2016 0.86  0.70 - 1.18 mg/dL Final   Comment:   For patients > or = 72 years of age: The upper reference limit for Creatinine is approximately 13% higher for people identified as African-American.     . Total  Bilirubin 11/11/2016 0.6  0.2 - 1.2 mg/dL Final  . Alkaline Phosphatase 11/11/2016 148* 40 - 115 U/L Final  . AST 11/11/2016 15  10 - 35 U/L Final  . ALT 11/11/2016 14  9 - 46 U/L Final  . Total Protein 11/11/2016 7.0  6.1 - 8.1 g/dL Final  . Albumin 11/11/2016 3.6  3.6 - 5.1 g/dL Final  . Calcium 11/11/2016 9.2  8.6 - 10.3 mg/dL Final  . GFR, Est African American 11/11/2016 >89  >=60 mL/min Final  . GFR, Est Non African American 11/11/2016 87  >=60 mL/min Final  . Cholesterol 11/11/2016 109  <200 mg/dL Final  . Triglycerides 11/11/2016 61  <150 mg/dL Final  . HDL 11/11/2016 62  >40 mg/dL Final  . Total CHOL/HDL Ratio 11/11/2016 1.8  <5.0 Ratio Final  . VLDL 11/11/2016 12  <30 mg/dL Final  . LDL Cholesterol 11/11/2016 35  <100 mg/dL Final  . WBC 11/11/2016 9.0  3.8 - 10.8 K/uL Final  . RBC 11/11/2016 4.58  4.20 - 5.80 MIL/uL Final  . Hemoglobin 11/11/2016 13.6  13.2 - 17.1 g/dL Final  . HCT 11/11/2016 41.2  38.5 - 50.0 % Final  . MCV 11/11/2016 90.0  80.0 - 100.0 fL Final  . MCH 11/11/2016 29.7  27.0 - 33.0 pg Final  . MCHC 11/11/2016 33.0  32.0 - 36.0 g/dL Final  . RDW 11/11/2016 15.1* 11.0 - 15.0 % Final  . Platelets 11/11/2016  121* 140 - 400 K/uL Final  . MPV 11/11/2016 11.7  7.5 - 12.5 fL Final  . Neutro Abs 11/11/2016 6660  1,500 - 7,800 cells/uL Final  . Lymphs Abs 11/11/2016 1620  850 - 3,900 cells/uL Final  . Monocytes Absolute 11/11/2016 630  200 - 950 cells/uL Final  . Eosinophils Absolute 11/11/2016 90  15 - 500 cells/uL Final  . Basophils Absolute 11/11/2016 0  0 - 200 cells/uL Final  . Neutrophils Relative % 11/11/2016 74  % Final  . Lymphocytes Relative 11/11/2016 18  % Final  . Monocytes Relative 11/11/2016 7  % Final  . Eosinophils Relative 11/11/2016 1  % Final  . Basophils Relative 11/11/2016 0  % Final  . Smear Review 11/11/2016 Criteria for review not met   Final    No results found.   Assessment/Plan   ICD-10-CM   1. Constipation, unspecified  constipation type K59.00   2. Chronic low back pain, unspecified back pain laterality, with sciatica presence unspecified M54.5    G89.29   3. Type 2 diabetes mellitus with diabetic polyneuropathy, without long-term current use of insulin (HCC) E11.42 BMP with eGFR    ALT    Hemoglobin A1c    Urinalysis with Reflex Microscopic    Microalbumin/Creatinine Ratio, Urine  4. PAF (paroxysmal atrial fibrillation) (HCC) I48.0   5. Essential hypertension I10   6. Mixed hyperlipidemia E78.2 Lipid Panel  7. Hx of BKA, left ([place]) Z89.512   8. Insomnia, unspecified type G47.00   9. Chronic intractable headache, unspecified headache type R51   10. Hemorrhoids, unspecified hemorrhoid type K64.9    nonthrombosed    Use preparation H as directed for hemorrhoids  May use Mag citrate as needed for severe constipation  START COLACE 100MG DAILY FOR CONSTIPATION  START MIRALAX 17 GMS DAILY FOR CONSTIPATION  May use topical patches as needed for back pain  Speciality pharmacy to call with topical pain agent  Continue other medications as ordered  Follow up with specialists as scheduled  Will call with lab results  Follow up in 3 mos for HTN, hyperlipidemia, PAF   Laylia Mui S. Perlie Gold  [facility] [address] [place], [place] [address] [phone] Cell (Monday-Friday 8 AM - 5 PM) [phone] After 5 PM and follow prompts

## 2017-04-07 NOTE — Patient Instructions (Addendum)
Use preparation H as directed for hemorrhoids  May use Mag citrate as needed for severe constipation  START COLACE 100MG  DAILY FOR CONSTIPATION  START MIRALAX 17 GMS DAILY FOR CONSTIPATION  May use topical patches as needed for back pain  Speciality pharmacy to call with topical pain agent  Continue other medications as ordered  Follow up with specialists as scheduled  Will call with lab results  Follow up in 3 mos for HTN, hyperlipidemia, PAF

## 2017-04-08 LAB — HEMOGLOBIN A1C
Hgb A1c MFr Bld: 5.9 % — ABNORMAL HIGH (ref <5.7)
Mean Plasma Glucose: 123 mg/dL

## 2017-04-19 ENCOUNTER — Other Ambulatory Visit: Payer: Medicare HMO

## 2017-04-19 DIAGNOSIS — E1142 Type 2 diabetes mellitus with diabetic polyneuropathy: Secondary | ICD-10-CM | POA: Diagnosis not present

## 2017-04-20 LAB — URINALYSIS, ROUTINE W REFLEX MICROSCOPIC
Bilirubin Urine: NEGATIVE
Glucose, UA: NEGATIVE
Hgb urine dipstick: NEGATIVE
Ketones, ur: NEGATIVE
Leukocytes, UA: NEGATIVE
Nitrite: NEGATIVE
Protein, ur: NEGATIVE
Specific Gravity, Urine: 1.008 (ref 1.001–1.035)
pH: 7 (ref 5.0–8.0)

## 2017-04-20 LAB — MICROALBUMIN / CREATININE URINE RATIO
Creatinine, Urine: 53 mg/dL (ref 20–370)
Microalb Creat Ratio: 11 mcg/mg creat (ref <30)
Microalb, Ur: 0.6 mg/dL

## 2017-04-21 NOTE — Telephone Encounter (Signed)
This encounter was created in error - please disregard.

## 2017-06-03 ENCOUNTER — Other Ambulatory Visit: Payer: Self-pay | Admitting: Internal Medicine

## 2017-06-30 ENCOUNTER — Ambulatory Visit (INDEPENDENT_AMBULATORY_CARE_PROVIDER_SITE_OTHER): Payer: Medicare HMO | Admitting: Neurology

## 2017-06-30 ENCOUNTER — Encounter: Payer: Self-pay | Admitting: Neurology

## 2017-06-30 VITALS — BP 100/68 | HR 82 | Ht 70.0 in | Wt 253.5 lb

## 2017-06-30 DIAGNOSIS — M5481 Occipital neuralgia: Secondary | ICD-10-CM

## 2017-06-30 DIAGNOSIS — G621 Alcoholic polyneuropathy: Secondary | ICD-10-CM

## 2017-06-30 DIAGNOSIS — E1142 Type 2 diabetes mellitus with diabetic polyneuropathy: Secondary | ICD-10-CM | POA: Diagnosis not present

## 2017-06-30 DIAGNOSIS — M542 Cervicalgia: Secondary | ICD-10-CM | POA: Diagnosis not present

## 2017-06-30 DIAGNOSIS — R69 Illness, unspecified: Secondary | ICD-10-CM | POA: Diagnosis not present

## 2017-06-30 MED ORDER — GABAPENTIN 600 MG PO TABS: 600.0000 mg | ORAL_TABLET | Freq: Three times a day (TID) | ORAL | 3 refills | Status: DC

## 2017-06-30 MED ORDER — AMITRIPTYLINE HCL 50 MG PO TABS: 50.0000 mg | ORAL_TABLET | Freq: Every day | ORAL | 3 refills | Status: DC

## 2017-06-30 NOTE — Progress Notes (Signed)
Follow-up Visit   Date: 06/30/17    Darrell Horne MRN: [id number] DOB: Jun 26, 1946   Interim History: Darrell Horne is a 71 y.o. right-handed Caucasian male with diabetes mellitus type 2 s/p left BKA (2013), chronic low back pain, hypertension, paroxsymal atrial fibrillation (on Eliquis), and hyperlipidemia returning to the clinic for follow-up of neuropathy and headaches.  The patient was accompanied to the clinic by self.  History of present illness: Beginning around late 1990s, he started having numbness and tingling of the feet which has progressed over the years. He underwent left BKA in 2013. He has numbness of the right foot and sharp pain involving the toes. Around 2013, he began noticing numbness of the finger tips. He is dropping objects because he cannot feel things. He denies any weakness of the hands. He is taking gabapentin '600mg'$  TID and amitriptyline '50mg'$  qhs. He has a 15 year history of alcoholism, but has been sober since 2013.  UPDATE 12/23/2015:  Headaches resolved since taking topamax '25mg'$  and amitriptyline '50mg'$  at bedtime.  Last headache was about 3 weeks ago and much milder than previously.  He has also noticed that his paresthesias are improved on topamax.    UPDATE 06/27/2016:  He was hospitalized in July for syncopal event while eating lunch, which was thought to be due to orthostasis as his BP was low on admission.  There was no seizure-like activity. He was recommended to have EP evaluation, but he decided to do additional work-up as an out-patient.  His beta blocker was reduced and he will be having cardiac monitoring.    UPDATE 12/26/2016:   Overall, his headaches are better since increasing amitriptyline to '50mg'$ , but he continues to have severe spells of sporadic and stabbing pain, lasting about 10-15 second, which can affect various parts of his head.  He has been following podiatry for right hallux ulceration, which has been improving.  Neuropathy in the  hands feels like it involving more of his fingers and he needs to be more careful when holding objects.  He has noticed dry mouth, which is most likely due to amitriptyline. It is not severe enough to make any changes to his medications.   UPDATE 06/30/2017:  He is here for follow-up visit. Over the past several months, he has noticed intermittent spells of left sided neck pain which radiates into his head, it usually lasts 30 min, self resolves, however can occur repeatedly for several days. He has not had this type of pain in the past 3 weeks.  His chronic daily headaches and neuropathy is stable and well controlled on amitriptyline 50 mg, topiramate 50 mg, and gabapentin 600 mg 3 times daily.  Medications:  Current Outpatient Prescriptions on File Prior to Visit  Medication Sig Dispense Refill  . atorvastatin (LIPITOR) 40 MG tablet TAKE 1 TABLET DAILY 90 tablet 1  . b complex vitamins tablet Take 1 tablet by mouth daily.    Marland Kitchen ELIQUIS 5 MG TABS tablet TAKE 1 TABLET TWICE A DAY 180 tablet 1  . metFORMIN (GLUCOPHAGE-XR) 500 MG 24 hr tablet Take 2 tablets (1,000 mg total) by mouth daily with supper. 180 tablet 3  . metoprolol succinate (TOPROL-XL) 50 MG 24 hr tablet TAKE 1 TABLET DAILY WITH ORIMMEDIATELY FOLLOWING A    MEAL 90 tablet 1  . Polyethyl Glycol-Propyl Glycol (SYSTANE OP) Place 1 drop into both eyes 2 (two) times daily as needed (dry eyes/ burning).    . topiramate (TOPAMAX) 50 MG  tablet Take 1 tablet (50 mg total) by mouth daily. 90 tablet 3  . vitamin C (ASCORBIC ACID) 500 MG tablet Take 500 mg by mouth 2 (two) times daily.      No current facility-administered medications on file prior to visit.     Allergies: No Known Allergies  Review of Systems:  CONSTITUTIONAL: No fevers, chills, night sweats, or weight loss.  EYES: No visual changes or eye pain ENT: No hearing changes.  No history of nose bleeds.   RESPIRATORY: No cough, wheezing and shortness of breath.   CARDIOVASCULAR:  Negative for chest pain, and palpitations.   GI: Negative for abdominal discomfort, blood in stools or black stools.  No recent change in bowel habits.   GU:  No history of incontinence.   MUSCLOSKELETAL: +history of joint pain or swelling.  No myalgias.   SKIN: +lesions, rash, and itching.   ENDOCRINE: Negative for cold or heat intolerance, polydipsia or goiter.   PSYCH:  No depression or anxiety symptoms.   NEURO: As Above.   Vital Signs:  BP 100/68   Pulse 82   Ht '5\' 10"'$  (1.778 m)   Wt 253 lb 8 oz (115 kg)   SpO2 97%   BMI 36.37 kg/m   Gen.: Well appearing, comfortable Neck: There is no tenderness over the lesser and greater occipital nerves, however he does report pain starting in this area when he has history of radiating hip pain.  Neurological Exam: MENTAL STATUS:  Awake, alert, and oriented 3. Speech is not dysarthric.   CRANIAL NERVES: Pupils are round and reactive to light. Extraocular muscles are intact. Face is symmetric.   MOTOR:  Left BKA.  Motor strength is 5/5 upper extremities including hip flexors, except 4+/5 intrinsic hand muscles.    SENSORY:  Reduced sensation in a glove-stocking distribution.  COORDINATION/GAIT:   Wide-based, moderately ataxic gait.  Data: Lab Results  Component Value Date   HGBA1C 5.9 (H) 04/07/2017   Labs 07/16/2015:  Vitamin B12 618, ESR 21, CRP 1.2, vitamin B1, copper 116  MRI/A brain 08/05/2015: 1. No intracranial explanation for headache.  No change since 2011. 2. Multi focal intracranial atherosclerosis as described above. The most proximal high-grade stenosis is at the left A1 segment. 3. No visualized carotid or vertebral artery stenosis in the neck.  IMPRESSION/PLAN: 1. Left occipital neuralgia, asymptomatic at this time.  Discussed option for nerve block, if pain returns, but will need to be cautious as he takes Eliquis.  He does have increased muscle tension over the cervical region side will start him in neck  physiotherapy for neck stretching.  Hopefully, this will also help his chronic daily headaches.  2.  Chronic tension headaches - well controlled on amitriptyline 50 mg at bedtime and topiramate 50 mg at bedtime. Due to dry mouth, avoid titrating amitriptyline any higher.  3.  Peripheral neuropathy due to diabetes and alcoholism (sober since 2013) affecting glove-stocking distribution c/b left BKA.  Diabetes is very well-controlled and I praised him or his last hemoglobin A1c which was 5.9. Clinically, exam and symptoms are stable. He will be continued on gabapentin 600 mg 3 times daily.  4.  Multifactorial gait abnormality due to neuropathy, low back pain, and left BKA  Return to clinic in 6 months  Greater than 50% of this 30 minute visit was spent in counseling, explanation of diagnosis, planning of further management, and coordination of care due to the progressive nature of his conditions.    Thank you  for allowing me to participate in patient's care.  If I can answer any additional questions, I would be pleased to do so.    Sincerely,    Lachell Rochette K. Posey Pronto, DO

## 2017-06-30 NOTE — Patient Instructions (Signed)
1.  Start neck physiotherapy  2.  Continue your medications as you are taking  Return to clinic in 6 months

## 2017-07-08 ENCOUNTER — Encounter: Payer: Self-pay | Admitting: Internal Medicine

## 2017-07-11 ENCOUNTER — Ambulatory Visit: Payer: Medicare HMO | Admitting: Internal Medicine

## 2017-07-26 ENCOUNTER — Ambulatory Visit: Payer: Medicare HMO | Admitting: Endocrinology

## 2017-08-09 ENCOUNTER — Other Ambulatory Visit: Payer: Self-pay | Admitting: Internal Medicine

## 2017-08-09 ENCOUNTER — Ambulatory Visit: Payer: Medicare HMO | Admitting: Internal Medicine

## 2017-09-05 ENCOUNTER — Other Ambulatory Visit: Payer: Self-pay | Admitting: Internal Medicine

## 2017-09-21 ENCOUNTER — Other Ambulatory Visit: Payer: Self-pay | Admitting: Internal Medicine

## 2017-09-21 ENCOUNTER — Other Ambulatory Visit: Payer: Self-pay | Admitting: Endocrinology

## 2017-10-18 DIAGNOSIS — H16213 Exposure keratoconjunctivitis, bilateral: Secondary | ICD-10-CM | POA: Diagnosis not present

## 2017-10-18 DIAGNOSIS — H16143 Punctate keratitis, bilateral: Secondary | ICD-10-CM | POA: Diagnosis not present

## 2017-10-18 DIAGNOSIS — H25813 Combined forms of age-related cataract, bilateral: Secondary | ICD-10-CM | POA: Diagnosis not present

## 2017-10-18 DIAGNOSIS — H5703 Miosis: Secondary | ICD-10-CM | POA: Diagnosis not present

## 2017-10-18 DIAGNOSIS — E119 Type 2 diabetes mellitus without complications: Secondary | ICD-10-CM | POA: Diagnosis not present

## 2017-10-18 DIAGNOSIS — H04123 Dry eye syndrome of bilateral lacrimal glands: Secondary | ICD-10-CM | POA: Diagnosis not present

## 2017-10-18 LAB — HM DIABETES EYE EXAM

## 2017-10-24 ENCOUNTER — Encounter: Payer: Self-pay | Admitting: *Deleted

## 2017-10-26 ENCOUNTER — Other Ambulatory Visit: Payer: Self-pay | Admitting: Internal Medicine

## 2017-10-31 ENCOUNTER — Encounter: Payer: Self-pay | Admitting: Internal Medicine

## 2017-10-31 ENCOUNTER — Ambulatory Visit: Payer: Medicare HMO | Admitting: Internal Medicine

## 2017-10-31 VITALS — BP 120/64 | HR 70 | Temp 97.4°F | Resp 18 | Ht 70.0 in | Wt 256.6 lb

## 2017-10-31 DIAGNOSIS — I10 Essential (primary) hypertension: Secondary | ICD-10-CM | POA: Diagnosis not present

## 2017-10-31 DIAGNOSIS — I480 Paroxysmal atrial fibrillation: Secondary | ICD-10-CM

## 2017-10-31 DIAGNOSIS — Z79899 Other long term (current) drug therapy: Secondary | ICD-10-CM | POA: Diagnosis not present

## 2017-10-31 DIAGNOSIS — Z89512 Acquired absence of left leg below knee: Secondary | ICD-10-CM | POA: Diagnosis not present

## 2017-10-31 DIAGNOSIS — E1142 Type 2 diabetes mellitus with diabetic polyneuropathy: Secondary | ICD-10-CM

## 2017-10-31 DIAGNOSIS — E782 Mixed hyperlipidemia: Secondary | ICD-10-CM | POA: Diagnosis not present

## 2017-10-31 MED ORDER — APIXABAN 5 MG PO TABS: 5.0000 mg | ORAL_TABLET | Freq: Two times a day (BID) | ORAL | 1 refills | Status: DC

## 2017-10-31 NOTE — Patient Instructions (Signed)
Continue current medications as ordered  Please check to see of you had your flu shot this already  Follow up with specialists as scheduled  Will call with referral appts  Will call with lab results  Follow up in 3 mos for AWV/CPE.

## 2017-10-31 NOTE — Progress Notes (Signed)
Patient ID: Darrell Horne, male   DOB: May 08, 1946, 72 y.o.   MRN: [id number]   Location:  [facility] OFFICE  Provider: DR Arletha Grippe  Code Status:  Goals of Care:  Advanced Directives 04/07/2017  Does Patient Have a Medical Advance Directive? Yes  Type of Advance Directive [place]  Does patient want to make changes to medical advance directive? No - Patient declined  Copy of [place] in Chart? Yes  Would patient like information on creating a medical advance directive? -     Chief Complaint  Patient presents with  . Medical Management of Chronic Issues    F/u- constiption, chronic back pain, DM, medication refill    HPI: Patient is a 72 y.o. male seen today for medical management of chronic diseases.  He has an appt with hearing specialist later today. He has not sen podiatry since 03/2017. He saw neuro in Sept 2018. He was told to f/u for neck physiotx but he did not.   DM2 - controlled on metformin. Has neuropathy that is uncontrolled and takes gabapentin 1268m per day and elavil. 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 He wears a prosthesis. Uses a cane most times but does have a walker when anticipating prolonged standing/walking. Followed by Endo Dr ELoanne Drilling CBGs 110- 140s. No low BS reactions. He saw podiatry in Apr 2018. He was tx for right geat toe ulcer which has healed. A1c 6.5%. Last eye exam by Dr RZadie Rhinein June 2018 showed no diabetic eye changes. He saw Dr GKaty Fitchearlier this month ad was told he needed OU cat sx. Urine micro/Cr ratio <5.8.  HTN - BP stable on metoprolol  Hyperlipidemia - stable on lipitor. LDL 43  PAF - rate controlled on metoprolol. Takes eliquis for anticoagulation. He has not seen cardio Dr ROval Linseysince 2016. Underwent lexiscan myoview in 06/2015 which was nml. 2D echo showed moderate diastolic dysfunction with nml EF. Last ECG in 06/2015 showed 1st degree AVB and LBBB. Previous cardio Dr at  [facility] Rarely has palpitations  HA - overall controlled on topamax. Chronic intractable. Followed by neurology Dr PPosey Pronto He was supposed to stop elavil but resumed it as his neuropathy worsened.   Insomnia - controlled on elavil  Chronic LBP - stable. Now off norco and zanaflex. Followed by Ortho Dr DSharol Given Of note, he underwent left rotator cuff repair in Dec 2016.  Memory loss - he still does not drive. MMSE 29/30 in 11/11/16   Past Medical History:  Diagnosis Date  . Chest pain   . Diabetes mellitus   . DM2 (diabetes mellitus, type 2) ([place]   . GERD (gastroesophageal reflux disease)   . HLD (hyperlipidemia)   . HTN (hypertension)   . PAF (paroxysmal atrial fibrillation) ([place]   . Palpitations     Past Surgical History:  Procedure Laterality Date  . APPENDECTOMY    . CHOLECYSTECTOMY    . COLONOSCOPY     Dr. SMichail Sermon . HERNIA REPAIR    . LEG AMPUTATION Left [id number]  Dr. RKendell Bane . SHOULDER SURGERY  [id number]  Dr. MMeridee Score . TOTAL ANKLE ARTHROPLASTY  [id number]    reports that  has never smoked. he has never used smokeless tobacco. He reports that he does not drink alcohol or use drugs. Social History   Socioeconomic History  . Marital status: Divorced    Spouse name: Not on file  . Number  of children: 4  . Years of education: Not on file  . Highest education level: Not on file  Social Needs  . Financial resource strain: Not on file  . Food insecurity - worry: Not on file  . Food insecurity - inability: Not on file  . Transportation needs - medical: Not on file  . Transportation needs - non-medical: Not on file  Occupational History  . Occupation: retired Nurse, mental health  . Smoking status: Never Smoker  . Smokeless tobacco: Never Used  Substance and Sexual Activity  . Alcohol use: No    Comment: Previously drinking a fifth of liquor every two days x 13 years  . Drug use: No  . Sexual activity: No  Other Topics Concern  . Not on  file  Social History Narrative   Diet:   Do you drink/eat things with caffeine?  Yes   Marital status:  Divorced.  What year were you married?  2001   Do you live in a house, assisted living, condo, apartment, trailer, etc.?  Apartment   Is it one or more stories?  1 story   How many persons live in your home?  1   Do you have any pets in your home?  No   Current or past profession:  Truck driver   Do you exercise?  Some  Type and how often:  Walking    Family History  Problem Relation Age of Onset  . Heart disease Mother        Living  . Stroke Father 5       Deceased  . Diabetes Sister   . Cancer Sister     No Known Allergies  Outpatient Encounter Medications as of 10/31/2017  Medication Sig  . amitriptyline (ELAVIL) 50 MG tablet Take 1 tablet (50 mg total) by mouth at bedtime.  Marland Kitchen apixaban (ELIQUIS) 5 MG TABS tablet Take 1 tablet (5 mg total) by mouth 2 (two) times daily.  Marland Kitchen atorvastatin (LIPITOR) 40 MG tablet TAKE 1 TABLET DAILY  . b complex vitamins tablet Take 1 tablet by mouth daily.  Marland Kitchen gabapentin (NEURONTIN) 600 MG tablet Take 1 tablet (600 mg total) by mouth 3 (three) times daily.  . metFORMIN (GLUCOPHAGE-XR) 500 MG 24 hr tablet TAKE 2 TABLETS DAILY WITH  SUPPER  . metoprolol succinate (TOPROL-XL) 50 MG 24 hr tablet TAKE 1 TABLET DAILY WITH ORIMMEDIATELY FOLLOWING A    MEAL  . Polyethyl Glycol-Propyl Glycol (SYSTANE OP) Place 1 drop into both eyes 2 (two) times daily as needed (dry eyes/ burning).  . topiramate (TOPAMAX) 50 MG tablet Take 1 tablet (50 mg total) by mouth daily.  . vitamin C (ASCORBIC ACID) 500 MG tablet Take 500 mg by mouth 2 (two) times daily.   . [DISCONTINUED] ELIQUIS 5 MG TABS tablet TAKE 1 TABLET TWICE A DAY  . [DISCONTINUED] metoprolol succinate (TOPROL-XL) 50 MG 24 hr tablet TAKE 1 TABLET DAILY WITH ORIMMEDIATELY FOLLOWING A    MEAL   No facility-administered encounter medications on file as of 10/31/2017.     Review of Systems:  Review of  Systems  HENT: Positive for hearing loss.   Musculoskeletal: Positive for arthralgias.  Neurological: Positive for headaches.  Psychiatric/Behavioral: Positive for sleep disturbance.  All other systems reviewed and are negative.   Health Maintenance  Topic Date Due  . Hepatitis C Screening  Feb 23, 1946  . INFLUENZA VACCINE  05/10/2017  . HEMOGLOBIN A1C  10/07/2017  . TETANUS/TDAP  11/10/2018 (Originally  09/13/1964)  . FOOT EXAM  01/24/2018  . URINE MICROALBUMIN  04/19/2018  . OPHTHALMOLOGY EXAM  10/18/2018  . COLONOSCOPY  08/19/2024  . PNA vac Low Risk Adult  Completed    Physical Exam: Vitals:   10/31/17 0925  BP: 120/64  Pulse: 70  Resp: 18  Temp: (!) 97.4 F (36.3 C)  TempSrc: Oral  SpO2: 92%  Weight: 256 lb 9.6 oz (116.4 kg)  Height: '5\' 10"'$  (1.778 m)   Body mass index is 36.82 kg/m. Physical Exam  Constitutional: He is oriented to person, place, and time. He appears well-developed and well-nourished.  HOH; family friend present  HENT:  Mouth/Throat: Oropharynx is clear and moist.  MMM; no oral thrush  Eyes: Pupils are equal, round, and reactive to light. No scleral icterus.  Neck: Neck supple. Carotid bruit is not present. No thyromegaly present.  Cardiovascular: Normal rate, regular rhythm and intact distal pulses. Exam reveals no gallop and no friction rub.  Murmur (1/6 SEM) heard. Left BKA with prosthesis intact; +1 pitting RLE edema. No calf TTP  Pulmonary/Chest: Effort normal and breath sounds normal. He has no wheezes. He has no rales. He exhibits no tenderness.  Abdominal: Soft. Normal appearance and bowel sounds are normal. He exhibits no distension, no abdominal bruit, no pulsatile midline mass and no mass. There is no hepatomegaly. There is no tenderness. There is no rigidity, no rebound and no guarding. No hernia.  obese  Musculoskeletal: He exhibits edema, tenderness and deformity (left bka).  Lymphadenopathy:    He has no cervical adenopathy.    Neurological: He is alert and oriented to person, place, and time. He has normal reflexes.  Skin: Skin is warm and dry. No rash noted.  Psychiatric: He has a normal mood and affect. His behavior is normal. Judgment and thought content normal.   Diabetic Foot Exam - Simple   Simple Foot Form Diabetic Foot exam was performed with the following findings:  Yes 10/31/2017 10:13 AM  Visual Inspection See comments:  Yes Sensation Testing See comments:  Yes Pulse Check Posterior Tibialis and Dorsalis pulse intact bilaterally:  Yes Comments Left BKA; reduced monofilament testing in RLE with no calluses/ulcerations present; right toenail hypertrophy noted     Labs reviewed: Basic Metabolic Panel: Recent Labs    11/11/16 0956 04/07/17 1104  NA 139 138  K 4.7 4.1  CL 107 107  CO2 24 21  GLUCOSE 104* 94  BUN 12 12  CREATININE 0.86 0.90  CALCIUM 9.2 8.6   Liver Function Tests: Recent Labs    11/11/16 0956 04/07/17 1104  AST 15  --   ALT 14 11  ALKPHOS 148*  --   BILITOT 0.6  --   PROT 7.0  --   ALBUMIN 3.6  --    No results for input(s): LIPASE, AMYLASE in the last 8760 hours. No results for input(s): AMMONIA in the last 8760 hours. CBC: Recent Labs    11/11/16 0001  WBC 9.0  NEUTROABS 6,660  HGB 13.6  HCT 41.2  MCV 90.0  PLT 121*   Lipid Panel: Recent Labs    11/11/16 0956 04/07/17 1104  CHOL 109 116  HDL 62 60  LDLCALC 35 43  TRIG 61 66  CHOLHDL 1.8 1.9   Lab Results  Component Value Date   HGBA1C 5.9 (H) 04/07/2017    Procedures since last visit: No results found.  Assessment/Plan   ICD-10-CM   1. PAF (paroxysmal atrial fibrillation) (HCC) I48.0 apixaban (ELIQUIS)  5 MG TABS tablet    CBC (no diff)    Ambulatory referral to Cardiology  2. Type 2 diabetes mellitus with diabetic polyneuropathy, without long-term current use of insulin (HCC) E11.42 CMP with eGFR([facility])    Hemoglobin A1c    Ambulatory referral to Podiatry  3. Essential  hypertension I10   4. Mixed hyperlipidemia E78.2 Lipid Panel    TSH  5. Hx of BKA, left ([place]) Z89.512   6. High risk medication use Z79.899 CBC (no diff)    Continue current medications as ordered - samples of eliquis given  Please check to see of you had your flu shot this already  Follow up with specialists as scheduled  Will call with referral appts  Will call with lab results  Follow up in 3 mos for AWV/CPE.    Lucilia Yanni S. Perlie Gold  [facility] [address] [place], [place] [address] [phone] Cell (Monday-Friday 8 AM - 5 PM) [phone] After 5 PM and follow prompts

## 2017-11-01 LAB — COMPLETE METABOLIC PANEL WITH GFR
AG Ratio: 1.2 (calc) (ref 1.0–2.5)
ALT: 17 U/L (ref 9–46)
AST: 15 U/L (ref 10–35)
Albumin: 3.6 g/dL (ref 3.6–5.1)
Alkaline phosphatase (APISO): 167 U/L — ABNORMAL HIGH (ref 40–115)
BUN: 11 mg/dL (ref 7–25)
CO2: 29 mmol/L (ref 20–32)
Calcium: 9.3 mg/dL (ref 8.6–10.3)
Chloride: 106 mmol/L (ref 98–110)
Creat: 0.87 mg/dL (ref 0.70–1.18)
GFR, Est African American: 100 mL/min/{1.73_m2} (ref >=60)
GFR, Est Non African American: 86 mL/min/{1.73_m2} (ref >=60)
Globulin: 3 g/dL (calc) (ref 1.9–3.7)
Glucose, Bld: 117 mg/dL — ABNORMAL HIGH (ref 65–99)
Potassium: 4.4 mmol/L (ref 3.5–5.3)
Sodium: 139 mmol/L (ref 135–146)
Total Bilirubin: 0.5 mg/dL (ref 0.2–1.2)
Total Protein: 6.6 g/dL (ref 6.1–8.1)

## 2017-11-01 LAB — LIPID PANEL
Cholesterol: 127 mg/dL (ref <200)
HDL: 85 mg/dL (ref >40)
LDL Cholesterol (Calc): 27 mg/dL (calc)
Non-HDL Cholesterol (Calc): 42 mg/dL (calc) (ref <130)
Total CHOL/HDL Ratio: 1.5 (calc) (ref <5.0)
Triglycerides: 66 mg/dL (ref <150)

## 2017-11-01 LAB — HEMOGLOBIN A1C
Hgb A1c MFr Bld: 6.1 % of total Hgb — ABNORMAL HIGH (ref <5.7)
Mean Plasma Glucose: 128 (calc)
eAG (mmol/L): 7.1 (calc)

## 2017-11-01 LAB — CBC
HCT: 40.3 % (ref 38.5–50.0)
Hemoglobin: 13.4 g/dL (ref 13.2–17.1)
MCH: 29.1 pg (ref 27.0–33.0)
MCHC: 33.3 g/dL (ref 32.0–36.0)
MCV: 87.6 fL (ref 80.0–100.0)
RBC: 4.6 10*6/uL (ref 4.20–5.80)
RDW: 12.6 % (ref 11.0–15.0)
WBC: 7.1 10*3/uL (ref 3.8–10.8)

## 2017-11-01 LAB — TSH: TSH: 1.37 mIU/L (ref 0.40–4.50)

## 2017-11-22 ENCOUNTER — Telehealth: Payer: Self-pay | Admitting: Internal Medicine

## 2017-11-22 NOTE — Telephone Encounter (Signed)
Left msg asking pt to confirm this AWV/CPE appt. Last AWV 11/11/16. VDM (DD)

## 2017-11-27 ENCOUNTER — Ambulatory Visit (INDEPENDENT_AMBULATORY_CARE_PROVIDER_SITE_OTHER): Payer: Medicare HMO | Admitting: Podiatry

## 2017-11-27 DIAGNOSIS — I70235 Atherosclerosis of native arteries of right leg with ulceration of other part of foot: Secondary | ICD-10-CM

## 2017-11-27 DIAGNOSIS — M79676 Pain in unspecified toe(s): Secondary | ICD-10-CM

## 2017-11-27 DIAGNOSIS — E0843 Diabetes mellitus due to underlying condition with diabetic autonomic (poly)neuropathy: Secondary | ICD-10-CM | POA: Diagnosis not present

## 2017-11-27 DIAGNOSIS — B351 Tinea unguium: Secondary | ICD-10-CM | POA: Diagnosis not present

## 2017-11-27 DIAGNOSIS — L97512 Non-pressure chronic ulcer of other part of right foot with fat layer exposed: Secondary | ICD-10-CM | POA: Diagnosis not present

## 2017-11-27 MED ORDER — GENTAMICIN SULFATE 0.1 % EX CREA: 1.0000 "application " | TOPICAL_CREAM | Freq: Three times a day (TID) | CUTANEOUS | 1 refills | Status: DC

## 2017-11-29 NOTE — Progress Notes (Signed)
   SUBJECTIVE Patient with a history of diabetes mellitus presents to office today complaining of elongated, thickened nails that cause pain while wearing shoes. Patient is unable to trim their own nails. He also reports a new ulceration to the second toe of the right foot. Patient is here for further evaluation and treatment.   Past Medical History:  Diagnosis Date  . Chest pain   . Diabetes mellitus   . DM2 (diabetes mellitus, type 2) ([place])   . GERD (gastroesophageal reflux disease)   . HLD (hyperlipidemia)   . HTN (hypertension)   . PAF (paroxysmal atrial fibrillation) ([place])   . Palpitations     OBJECTIVE General Patient is awake, alert, and oriented x 3 and in no acute distress. Derm Skin is dry and supple bilateral. Nails are tender, long, thickened and dystrophic with subungual debris, consistent with onychomycosis, 1-5 right. No signs of infection noted.  Wound #1 noted to the right second toe measuring 0.5 x 0.7 x 0.1 cm.  To the above-noted ulceration, there is no eschar. There is a moderate amount of slough, fibrin and necrotic tissue. Granulation tissue and wound base is red. There is no malodor. There is a minimal amount of serosanginous drainage noted. Periwound integrity is intact.   Vasc  DP and PT pedal pulses palpable of the right lower extremity. Temperature gradient within normal limits.  Neuro Epicritic and protective threshold sensation diminished of the right lower extremity.  Musculoskeletal Exam No symptomatic pedal deformities noted to the RLE. Muscular strength within normal limits.  ASSESSMENT 1. Diabetes Mellitus w/ peripheral neuropathy 2. Onychomycosis of nail due to dermatophyte RLE 3. Ulceration 2nd toe right foot secondary to diabetes mellitus  4. H/o BKA LLE  PLAN OF CARE 1. Patient evaluated today. 2. Medically necessary excisional debridement including subcutaneous tissue was performed using a tissue nipper and a chisel blade. Excisional  debridement of all the necrotic nonviable tissue down to healthy bleeding viable tissue was performed with post-debridement measurements same as pre-. 3. The wound was cleansed and dry sterile dressing applied. 4. Instructed to maintain good pedal hygiene and foot care. Stressed importance of controlling blood sugar.  5. Mechanical debridement of nails 1-5 of the right foot performed using a nail nipper. Filed with dremel without incident.  6. Prescription for gentamicin cream provided to patient to be used daily with a Band-Aid.  7. Continue wearing DM shoes.  8. Return to clinic in 3 weeks.     Edrick Kins, DPM [facility]  Dr. Edrick Kins, [place]                                        [place], [place] [address]                Office [phone]  Fax [phone]

## 2017-12-07 ENCOUNTER — Ambulatory Visit: Payer: Medicare HMO | Admitting: Cardiovascular Disease

## 2017-12-18 ENCOUNTER — Ambulatory Visit: Payer: Medicare HMO | Admitting: Cardiovascular Disease

## 2017-12-18 ENCOUNTER — Ambulatory Visit: Payer: Medicare HMO | Admitting: Podiatry

## 2017-12-21 NOTE — Telephone Encounter (Signed)
Left msg asking pt to confirm this AWV/CPE appt. VDM (DD)

## 2017-12-29 NOTE — Progress Notes (Signed)
Follow-up Visit   Date: 01/01/18    Darrell Horne MRN: [id number] DOB: May 24, 1946   Interim History: Darrell Horne is a 72 y.o. right-handed Caucasian male with diabetes mellitus type 2 s/p left BKA (2013), chronic low back pain, hypertension, paroxsymal atrial fibrillation (on Eliquis), and hyperlipidemia returning to the clinic for follow-up of neuropathy and headaches.  The patient was accompanied to the clinic by self.  History of present illness: Beginning around late 1990s, he started having numbness and tingling of the feet which has progressed over the years. He underwent left BKA in 2013. He has numbness of the right foot and sharp pain involving the toes. Around 2013, he began noticing numbness of the finger tips. He is dropping objects because he cannot feel things. He denies any weakness of the hands. He is taking gabapentin 654m TID and amitriptyline 54mqhs. He has a 15 year history of alcoholism, but has been sober since 2013.    He was also having chronic headaches, which resolved with topamax 2577mnd amitriptyline 54m54m bedtime. Dry mouth prohibits further titration of amitriptyline.  Neuropathy in the hands feels like it involving more of his fingers and he needs to be more careful when holding objects. Pain is well controlled on gabapentin 600mg74m.  UPDATE 01/01/2018:  He is here for 6 month follow-up visit.  His neuropathy mostly well-controlled on gabapentin 600mg 65m he still has spells of severe pain in the right toe.  His headaches are getting worse and he is taking tylenol daily for this, as well as his left shoulder pain. He also complains of severe dry mouth.  He had one fall after slipping on water on the kitchen floor, he did not suffer any injuries.    Medications:  Current Outpatient Medications on File Prior to Visit  Medication Sig Dispense Refill  . amitriptyline (ELAVIL) 50 MG tablet Take 1 tablet (50 mg total) by mouth at bedtime. 90 tablet  3  . apixaban (ELIQUIS) 5 MG TABS tablet Take 1 tablet (5 mg total) by mouth 2 (two) times daily. 180 tablet 1  . atorvastatin (LIPITOR) 40 MG tablet TAKE 1 TABLET DAILY 90 tablet 1  . b complex vitamins tablet Take 1 tablet by mouth daily.    . gabaMarland Kitchenentin (NEURONTIN) 600 MG tablet Take 1 tablet (600 mg total) by mouth 3 (three) times daily. 90 tablet 3  . gentamicin cream (GARAMYCIN) 0.1 % Apply 1 application topically 3 (three) times daily. 30 g 1  . metFORMIN (GLUCOPHAGE-XR) 500 MG 24 hr tablet TAKE 2 TABLETS DAILY WITH  SUPPER 180 tablet 3  . metoprolol succinate (TOPROL-XL) 50 MG 24 hr tablet TAKE 1 TABLET DAILY WITH ORIMMEDIATELY FOLLOWING A    MEAL 90 tablet 1  . Polyethyl Glycol-Propyl Glycol (SYSTANE OP) Place 1 drop into both eyes 2 (two) times daily as needed (dry eyes/ burning).    . topiramate (TOPAMAX) 50 MG tablet Take 1 tablet (50 mg total) by mouth daily. 90 tablet 3  . vitamin C (ASCORBIC ACID) 500 MG tablet Take 500 mg by mouth 2 (two) times daily.      No current facility-administered medications on file prior to visit.     Allergies: No Known Allergies  Review of Systems:  CONSTITUTIONAL: No fevers, chills, night sweats, or weight loss.  EYES: No visual changes or eye pain ENT: No hearing changes.  No history of nose bleeds.   RESPIRATORY: No cough, wheezing and shortness of  breath.   CARDIOVASCULAR: Negative for chest pain, and palpitations.   GI: Negative for abdominal discomfort, blood in stools or black stools.  No recent change in bowel habits.   GU:  No history of incontinence.   MUSCLOSKELETAL: +history of joint pain or swelling.  No myalgias.   SKIN: +lesions, rash, and itching.   ENDOCRINE: Negative for cold or heat intolerance, polydipsia or goiter.   PSYCH:  No depression or anxiety symptoms.   NEURO: As Above.   Vital Signs:  BP 104/68   Pulse 90   Ht _0  (1.778 m)   Wt 222 lb 2 oz (100.8 kg)   SpO2 96%   BMI 31.87 kg/m   Gen.: Well  appearing, comfortable  Neurological Exam: MENTAL STATUS:  Awake, alert, and oriented 3. Speech is not dysarthric.   CRANIAL NERVES: Pupils are round and reactive to light. Extraocular muscles are intact. Face is symmetric.   MOTOR:  Left BKA.  Motor strength is 5/5 upper extremities including hip flexors, except 4+/5 intrinsic hand muscles.    SENSORY:  Trace vibration at the right knee, reduced at the MCP bilaterally, intact at the elbows.  REFLEXES:  Reflexes are 2+/4 in the arms, trace at the right knee  COORDINATION/GAIT:   Wide-based, moderately ataxic gait.  Data: Lab Results  Component Value Date   HGBA1C 6.1 (H) 10/31/2017   Labs 07/16/2015:  Vitamin B12 618, ESR 21, CRP 1.2, vitamin B1, copper 116  MRI/A brain 08/05/2015: 1. No intracranial explanation for headache.  No change since 2011. 2. Multi focal intracranial atherosclerosis as described above. The most proximal high-grade stenosis is at the left A1 segment. 3. No visualized carotid or vertebral artery stenosis in the neck.  IMPRESSION/PLAN: 1. Chronic tension headaches, worsening.  Due to dry mouth, I will reduce amitriptyline to 43m daily and increase topiramate to 778mdaily.  2.  Peripheral neuropathy to diabetes and alcoholism (sober since 2013) affecting glove-stocking distribution c/b left BKA.  Diabetes is very well-controlled.  Neuropathy is stable, still with intermittent breakthrough neuropathic pain.  Continue gabapentin 600 mg 3 times daily.  Return to clinic in 9 months  Greater than 50% of this 25 minute visit was spent in counseling, explanation of diagnosis, planning of further management, and coordination of care.   Thank you for allowing me to participate in patient's care.  If I can answer any additional questions, I would be pleased to do so.    Sincerely,    Amberlee Garvey K. PaPosey ProntoDO

## 2018-01-01 ENCOUNTER — Encounter: Payer: Self-pay | Admitting: Neurology

## 2018-01-01 ENCOUNTER — Ambulatory Visit: Payer: Medicare HMO | Admitting: Neurology

## 2018-01-01 VITALS — BP 104/68 | HR 90 | Ht 70.0 in | Wt 222.1 lb

## 2018-01-01 DIAGNOSIS — E1142 Type 2 diabetes mellitus with diabetic polyneuropathy: Secondary | ICD-10-CM

## 2018-01-01 DIAGNOSIS — M542 Cervicalgia: Secondary | ICD-10-CM | POA: Diagnosis not present

## 2018-01-01 DIAGNOSIS — R519 Headache, unspecified: Secondary | ICD-10-CM

## 2018-01-01 DIAGNOSIS — R51 Headache: Secondary | ICD-10-CM

## 2018-01-01 MED ORDER — TOPIRAMATE 50 MG PO TABS: 75.0000 mg | ORAL_TABLET | Freq: Every day | ORAL | 3 refills | Status: DC

## 2018-01-01 MED ORDER — GABAPENTIN 600 MG PO TABS: 600.0000 mg | ORAL_TABLET | Freq: Three times a day (TID) | ORAL | 3 refills | Status: DC

## 2018-01-01 MED ORDER — AMITRIPTYLINE HCL 25 MG PO TABS: 25.0000 mg | ORAL_TABLET | Freq: Every day | ORAL | 3 refills | Status: DC

## 2018-01-01 NOTE — Patient Instructions (Addendum)
Increase topiramate to 75mg  (1.5 tablet) daily  Reduce amitriptyline 25mg  at bedtime   Continue gabapentin 600mg  three times daily  Return to clinic in 9 months

## 2018-01-02 ENCOUNTER — Other Ambulatory Visit: Payer: Self-pay | Admitting: Internal Medicine

## 2018-01-23 DIAGNOSIS — H25813 Combined forms of age-related cataract, bilateral: Secondary | ICD-10-CM | POA: Diagnosis not present

## 2018-01-23 DIAGNOSIS — H5703 Miosis: Secondary | ICD-10-CM | POA: Diagnosis not present

## 2018-01-23 DIAGNOSIS — H353131 Nonexudative age-related macular degeneration, bilateral, early dry stage: Secondary | ICD-10-CM | POA: Diagnosis not present

## 2018-01-23 DIAGNOSIS — H16213 Exposure keratoconjunctivitis, bilateral: Secondary | ICD-10-CM | POA: Diagnosis not present

## 2018-01-23 DIAGNOSIS — E119 Type 2 diabetes mellitus without complications: Secondary | ICD-10-CM | POA: Diagnosis not present

## 2018-01-23 DIAGNOSIS — H04123 Dry eye syndrome of bilateral lacrimal glands: Secondary | ICD-10-CM | POA: Diagnosis not present

## 2018-01-24 ENCOUNTER — Ambulatory Visit: Payer: Medicare HMO

## 2018-01-24 ENCOUNTER — Ambulatory Visit (INDEPENDENT_AMBULATORY_CARE_PROVIDER_SITE_OTHER): Payer: Medicare HMO | Admitting: Internal Medicine

## 2018-01-24 ENCOUNTER — Encounter: Payer: Self-pay | Admitting: Internal Medicine

## 2018-01-24 VITALS — BP 124/60 | HR 90 | Temp 97.7°F | Ht 70.0 in | Wt 255.0 lb

## 2018-01-24 DIAGNOSIS — L97511 Non-pressure chronic ulcer of other part of right foot limited to breakdown of skin: Secondary | ICD-10-CM | POA: Diagnosis not present

## 2018-01-24 DIAGNOSIS — G4700 Insomnia, unspecified: Secondary | ICD-10-CM

## 2018-01-24 DIAGNOSIS — F329 Major depressive disorder, single episode, unspecified: Secondary | ICD-10-CM | POA: Diagnosis not present

## 2018-01-24 DIAGNOSIS — E782 Mixed hyperlipidemia: Secondary | ICD-10-CM

## 2018-01-24 DIAGNOSIS — Z0000 Encounter for general adult medical examination without abnormal findings: Secondary | ICD-10-CM | POA: Diagnosis not present

## 2018-01-24 DIAGNOSIS — I480 Paroxysmal atrial fibrillation: Secondary | ICD-10-CM

## 2018-01-24 DIAGNOSIS — R519 Headache, unspecified: Secondary | ICD-10-CM

## 2018-01-24 DIAGNOSIS — E11621 Type 2 diabetes mellitus with foot ulcer: Secondary | ICD-10-CM | POA: Diagnosis not present

## 2018-01-24 DIAGNOSIS — Z79899 Other long term (current) drug therapy: Secondary | ICD-10-CM

## 2018-01-24 DIAGNOSIS — R51 Headache: Secondary | ICD-10-CM

## 2018-01-24 DIAGNOSIS — R69 Illness, unspecified: Secondary | ICD-10-CM | POA: Diagnosis not present

## 2018-01-24 DIAGNOSIS — I10 Essential (primary) hypertension: Secondary | ICD-10-CM | POA: Diagnosis not present

## 2018-01-24 DIAGNOSIS — E1142 Type 2 diabetes mellitus with diabetic polyneuropathy: Secondary | ICD-10-CM

## 2018-01-24 DIAGNOSIS — G8929 Other chronic pain: Secondary | ICD-10-CM

## 2018-01-24 DIAGNOSIS — F32A Depression, unspecified: Secondary | ICD-10-CM

## 2018-01-24 MED ORDER — TETANUS-DIPHTH-ACELL PERTUSSIS 5-2.5-18.5 LF-MCG/0.5 IM SUSP: 0.5000 mL | Freq: Once | INTRAMUSCULAR | 0 refills | Status: AC

## 2018-01-24 MED ORDER — ZOSTER VAC RECOMB ADJUVANTED 50 MCG/0.5ML IM SUSR: 0.5000 mL | Freq: Once | INTRAMUSCULAR | 1 refills | Status: AC

## 2018-01-24 MED ORDER — AMITRIPTYLINE HCL 50 MG PO TABS: 50.0000 mg | ORAL_TABLET | Freq: Every day | ORAL | 3 refills | Status: DC

## 2018-01-24 NOTE — Patient Instructions (Signed)
Darrell Horne , Thank you for taking time to come for your Medicare Wellness Visit. I appreciate your ongoing commitment to your health goals. Please review the following plan we discussed and let me know if I can assist you in the future.   Screening recommendations/referrals: Colonoscopy up to date, due 08/27/2024 Recommended yearly ophthalmology/optometry visit for glaucoma screening and checkup Recommended yearly dental visit for hygiene and checkup  Vaccinations: Influenza vaccine due in 2019 fall season Pneumococcal vaccine up to date, completed Tdap vaccine due, prescription sent to pharmacy Shingles vaccine due, prescription sent to pharmacy    Advanced directives: in chart  Conditions/risks identified: none  Next appointment: Darrell Dense, RN 01/28/2019 @ 9:15am  Preventive Care 28 Years and Older, Male Preventive care refers to lifestyle choices and visits with your health care provider that can promote health and wellness. What does preventive care include?  A yearly physical exam. This is also called an annual well check.  Dental exams once or twice a year.  Routine eye exams. Ask your health care provider how often you should have your eyes checked.  Personal lifestyle choices, including:  Daily care of your teeth and gums.  Regular physical activity.  Eating a healthy diet.  Avoiding tobacco and drug use.  Limiting alcohol use.  Practicing safe sex.  Taking low doses of aspirin every day.  Taking vitamin and mineral supplements as recommended by your health care provider. What happens during an annual well check? The services and screenings done by your health care provider during your annual well check will depend on your age, overall health, lifestyle risk factors, and family history of disease. Counseling  Your health care provider may ask you questions about your:  Alcohol use.  Tobacco use.  Drug use.  Emotional well-being.  Home and  relationship well-being.  Sexual activity.  Eating habits.  History of falls.  Memory and ability to understand (cognition).  Work and work Statistician. Screening  You may have the following tests or measurements:  Height, weight, and BMI.  Blood pressure.  Lipid and cholesterol levels. These may be checked every 5 years, or more frequently if you are over 74 years old.  Skin check.  Lung cancer screening. You may have this screening every year starting at age 29 if you have a 30-pack-year history of smoking and currently smoke or have quit within the past 15 years.  Fecal occult blood test (FOBT) of the stool. You may have this test every year starting at age 58.  Flexible sigmoidoscopy or colonoscopy. You may have a sigmoidoscopy every 5 years or a colonoscopy every 10 years starting at age 36.  Prostate cancer screening. Recommendations will vary depending on your family history and other risks.  Hepatitis C blood test.  Hepatitis B blood test.  Sexually transmitted disease (STD) testing.  Diabetes screening. This is done by checking your blood sugar (glucose) after you have not eaten for a while (fasting). You may have this done every 1-3 years.  Abdominal aortic aneurysm (AAA) screening. You may need this if you are a current or former smoker.  Osteoporosis. You may be screened starting at age 63 if you are at high risk. Talk with your health care provider about your test results, treatment options, and if necessary, the need for more tests. Vaccines  Your health care provider may recommend certain vaccines, such as:  Influenza vaccine. This is recommended every year.  Tetanus, diphtheria, and acellular pertussis (Tdap, Td) vaccine. You may  need a Td booster every 10 years.  Zoster vaccine. You may need this after age 60.  Pneumococcal 13-valent conjugate (PCV13) vaccine. One dose is recommended after age 31.  Pneumococcal polysaccharide (PPSV23) vaccine. One  dose is recommended after age 33. Talk to your health care provider about which screenings and vaccines you need and how often you need them. This information is not intended to replace advice given to you by your health care provider. Make sure you discuss any questions you have with your health care provider. Document Released: 10/23/2015 Document Revised: 06/15/2016 Document Reviewed: 07/28/2015 Elsevier Interactive Patient Education  2017 [place] Prevention in the Home Falls can cause injuries. They can happen to people of all ages. There are many things you can do to make your home safe and to help prevent falls. What can I do on the outside of my home?  Regularly fix the edges of walkways and driveways and fix any cracks.  Remove anything that might make you trip as you walk through a door, such as a raised step or threshold.  Trim any bushes or trees on the path to your home.  Use bright outdoor lighting.  Clear any walking paths of anything that might make someone trip, such as rocks or tools.  Regularly check to see if handrails are loose or broken. Make sure that both sides of any steps have handrails.  Any raised decks and porches should have guardrails on the edges.  Have any leaves, snow, or ice cleared regularly.  Use sand or salt on walking paths during winter.  Clean up any spills in your garage right away. This includes oil or grease spills. What can I do in the bathroom?  Use night lights.  Install grab bars by the toilet and in the tub and shower. Do not use towel bars as grab bars.  Use non-skid mats or decals in the tub or shower.  If you need to sit down in the shower, use a plastic, non-slip stool.  Keep the floor dry. Clean up any water that spills on the floor as soon as it happens.  Remove soap buildup in the tub or shower regularly.  Attach bath mats securely with double-sided non-slip rug tape.  Do not have throw rugs and other  things on the floor that can make you trip. What can I do in the bedroom?  Use night lights.  Make sure that you have a light by your bed that is easy to reach.  Do not use any sheets or blankets that are too big for your bed. They should not hang down onto the floor.  Have a firm chair that has side arms. You can use this for support while you get dressed.  Do not have throw rugs and other things on the floor that can make you trip. What can I do in the kitchen?  Clean up any spills right away.  Avoid walking on wet floors.  Keep items that you use a lot in easy-to-reach places.  If you need to reach something above you, use a strong step stool that has a grab bar.  Keep electrical cords out of the way.  Do not use floor polish or wax that makes floors slippery. If you must use wax, use non-skid floor wax.  Do not have throw rugs and other things on the floor that can make you trip. What can I do with my stairs?  Do not leave any items on  the stairs.  Make sure that there are handrails on both sides of the stairs and use them. Fix handrails that are broken or loose. Make sure that handrails are as long as the stairways.  Check any carpeting to make sure that it is firmly attached to the stairs. Fix any carpet that is loose or worn.  Avoid having throw rugs at the top or bottom of the stairs. If you do have throw rugs, attach them to the floor with carpet tape.  Make sure that you have a light switch at the top of the stairs and the bottom of the stairs. If you do not have them, ask someone to add them for you. What else can I do to help prevent falls?  Wear shoes that:  Do not have high heels.  Have rubber bottoms.  Are comfortable and fit you well.  Are closed at the toe. Do not wear sandals.  If you use a stepladder:  Make sure that it is fully opened. Do not climb a closed stepladder.  Make sure that both sides of the stepladder are locked into place.  Ask  someone to hold it for you, if possible.  Clearly mark and make sure that you can see:  Any grab bars or handrails.  First and last steps.  Where the edge of each step is.  Use tools that help you move around (mobility aids) if they are needed. These include:  Canes.  Walkers.  Scooters.  Crutches.  Turn on the lights when you go into a dark area. Replace any light bulbs as soon as they burn out.  Set up your furniture so you have a clear path. Avoid moving your furniture around.  If any of your floors are uneven, fix them.  If there are any pets around you, be aware of where they are.  Review your medicines with your doctor. Some medicines can make you feel dizzy. This can increase your chance of falling. Ask your doctor what other things that you can do to help prevent falls. This information is not intended to replace advice given to you by your health care provider. Make sure you discuss any questions you have with your health care provider. Document Released: 07/23/2009 Document Revised: 03/03/2016 Document Reviewed: 10/31/2014 Elsevier Interactive Patient Education  2017 [employer].

## 2018-01-24 NOTE — Progress Notes (Signed)
Patient ID: Darrell Horne, male   DOB: [birth year], 72 y.o.   MRN: [id number]   Location:  [facility]  Place of Service:  OFFICE  Provider: Arletha Grippe, DO  Patient Care Team: Gildardo Cranker, DO as PCP - General (Internal Medicine) Clent Jacks, MD as Consulting Physician (Ophthalmology)  Extended Emergency Contact Information Primary Emergency Contact: Sinquefield,Anna Address: [address]          [place], [place] [address] Johnnette Litter of [place] Phone: [phone] Relation: Spouse Secondary Emergency Contact: Thomasenia Bottoms [place] of [place] Phone: [phone] Relation: Sister  Code Status:  Goals of Care: Advanced Directive information Advanced Directives 01/24/2018  Does Patient Have a Medical Advance Directive? Yes  Type of Advance Directive [place]  Does patient want to make changes to medical advance directive? -  Copy of [place] in Chart? Yes  Would patient like information on creating a medical advance directive? -     Chief Complaint  Patient presents with  . Annual Exam    Yearly check-up, AWV completed today, 28/30 MMSE(Passed clock drawing), + depression, + fall risk     HPI: Patient is a 72 y.o. male seen in today for a comprehensive exam. He completed AWV this AM. MMSE 28/30. He reports frequent depressive mood. His depression screen is (+). He has increased fall risk.   DM2 - controlled on metformin. Has neuropathy that is uncontrolled and takes gabapentin 1251m per day and elavil. 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 He wears a prosthesis. Uses a cane most times but does have a walker when anticipating prolonged standing/walking. Followed by Endo Dr ELoanne Drilling CBGs 110- 140s. No low BS reactions. He saw podiatry in Apr 2018. He was tx for right geat toe ulcer which has healed. A1c 6.1%. Last eye exam by Dr RZadie Rhinein June 2018 showed no diabetic eye changes. He saw Dr GKaty Fitchearlier this month  ad was told he needed OU cat sx. Urine micro/Cr ratio 11; he has a right diabetic foot ulcer followed by podiatry although he cancelled his last appt several weeks ago.  HTN - BP stable on metoprolol  Hyperlipidemia - stable on lipitor. LDL 27  PAF - rate controlled on metoprolol. Takes eliquis for anticoagulation. He has not seen cardio Dr ROval Linseysince 2016. Underwent lexiscan myoview in 06/2015 which was nml. 2D echo showed moderate diastolic dysfunction with nml EF. Last ECG in 06/2015 showed 1st degree AVB and LBBB. Previous cardio Dr at [facility] Rarely has palpitations  HA - overall controlled on topamax and elavil. Chronic intractable. Followed by neurology Dr PPosey Pronto He was supposed to stop elavil but resumed it as his neuropathy worsened.   Insomnia - controlled on elavil  Chronic LBP - mostly uncontrolled but does not desire to take norco or zanaflex. Followed by Ortho Dr DSharol Given Of note, he underwent left rotator cuff repair in Dec 2016; alk phos 167  Memory loss - he still does not drive. MMSE 29/30 in 11/11/16  [facility] 01/24/2018 11/11/2016 12/09/2015  Decreased Interest 0 0 0  Down, Depressed, Hopeless 2 1 0  PHQ - 2 Score 2 1 0  Altered sleeping 0 - -  Tired, decreased energy 1 - -  Change in appetite 1 - -  Feeling bad or failure about yourself  1 - -  Trouble concentrating 0 - -  Moving slowly or fidgety/restless 0 - -  Suicidal thoughts 0 - -  PHQ-9 Score 5 - -  Difficult doing work/chores Somewhat difficult - -    Fall Risk  01/24/2018 01/01/2018 06/30/2017 04/07/2017 12/26/2016  Falls in the past year? Yes Yes No No Yes  Number falls in past yr: 1 1 - - 1  Injury with Fall? No No - - No  Comment - - - - -  Risk Factor Category  - - - - -  Risk for fall due to : - Impaired balance/gait;Impaired mobility - - Impaired balance/gait  Follow up - Falls evaluation completed;Education provided;Falls prevention discussed - - Falls evaluation completed;Education  provided;Falls prevention discussed   MMSE - Mini Mental State Exam 01/24/2018 11/11/2016 12/09/2015  Not completed: - - (No Data)  Orientation to time _0 Orientation to Place _1 Registration _2 Attention/ Calculation _3 Recall _4 Language- name 2 objects _5 Language- repeat _6 Language- follow 3 step command _7 Language- read & follow direction _8 Write a sentence _9 Copy design 0 1 1  Total score _10 Health Maintenance  Topic Date Due  . Hepatitis C Screening  10/21/1945  . TETANUS/TDAP  11/10/2018 (Originally 09/13/1965)  . URINE MICROALBUMIN  04/19/2018  . HEMOGLOBIN A1C  04/30/2018  . INFLUENZA VACCINE  05/10/2018  . OPHTHALMOLOGY EXAM  10/18/2018  . FOOT EXAM  10/31/2018  . COLONOSCOPY  08/19/2024  . PNA vac Low Risk Adult  Completed    Past Medical History:  Diagnosis Date  . Chest pain   . Diabetes mellitus   . DM2 (diabetes mellitus, type 2) ([place])   . GERD (gastroesophageal reflux disease)   . HLD (hyperlipidemia)   . HTN (hypertension)   . PAF (paroxysmal atrial fibrillation) ([place])   . Palpitations     Past Surgical History:  Procedure Laterality Date  . APPENDECTOMY    . CHOLECYSTECTOMY    . COLONOSCOPY     Dr. Michail Sermon  . HERNIA REPAIR    . LEG AMPUTATION Left [id number]   Dr. Kendell Bane  . SHOULDER SURGERY  [id number]   Dr. Meridee Score  . TOTAL ANKLE ARTHROPLASTY  [id number]    Family History  Problem Relation Age of Onset  . Heart disease Mother        Living  . Stroke Father 52       Deceased  . Diabetes Sister   . Cancer Sister    Family Status  Relation Name Status  . Mother Walker Sitar Deceased at age 2  . Father [place] Rico Deceased  . Sister Bluford Kaufmann  . Brother [employer]  . Daughter  Alive  . Daughter  Alive  . Daughter  Alive  . Son  Alive  . Sister Herold Harms  . Sister Vaughan Basta Deceased       Throat Cancer    Social History   Socioeconomic History  . Marital  status: Divorced    Spouse name: Not on file  . Number of children: 4  . Years of education: Not on file  . Highest education level: Not on file  Occupational History  . Occupation: retired Investment banker, operational  . Financial resource strain: Not hard at all  . Food insecurity:    Worry: Never true    Inability: Never true  . Transportation needs:  Medical: No    Non-medical: No  Tobacco Use  . Smoking status: Former Smoker    Years: 12.00    Last attempt to quit: 10/10/1981    Years since quitting: 36.3  . Smokeless tobacco: Never Used  Substance and Sexual Activity  . Alcohol use: No    Comment: Previously drinking a fifth of liquor every two days x 13 years  . Drug use: No  . Sexual activity: Never  Lifestyle  . Physical activity:    Days per week: 0 days    Minutes per session: 0 min  . Stress: To some extent  Relationships  . Social connections:    Talks on phone: Once a week    Gets together: Once a week    Attends religious service: More than 4 times per year    Active member of club or organization: No    Attends meetings of clubs or organizations: Never    Relationship status: Divorced  . Intimate partner violence:    Fear of current or ex partner: No    Emotionally abused: No    Physically abused: No    Forced sexual activity: No  Other Topics Concern  . Not on file  Social History Narrative   Diet:   Do you drink/eat things with caffeine?  Yes   Marital status:  Divorced.  What year were you married?  2001   Do you live in a house, assisted living, condo, apartment, trailer, etc.?  Apartment   Is it one or more stories?  1 story   How many persons live in your home?  1   Do you have any pets in your home?  No   Current or past profession:  Truck driver   Do you exercise?  Some  Type and how often:  Walking    No Known Allergies  Allergies as of 01/24/2018   No Known Allergies     Medication List        Accurate as of 01/24/18  9:58 AM.  Always use your most recent med list.          amitriptyline 25 MG tablet Commonly known as:  ELAVIL Take 1 tablet (25 mg total) by mouth at bedtime.   apixaban 5 MG Tabs tablet Commonly known as:  ELIQUIS Take 1 tablet (5 mg total) by mouth 2 (two) times daily.   atorvastatin 40 MG tablet Commonly known as:  LIPITOR TAKE 1 TABLET DAILY   b complex vitamins tablet Take 1 tablet by mouth daily.   gabapentin 600 MG tablet Commonly known as:  NEURONTIN Take 1 tablet (600 mg total) by mouth 3 (three) times daily.   gentamicin cream 0.1 % Commonly known as:  GARAMYCIN Apply 1 application topically 3 (three) times daily.   metFORMIN 500 MG 24 hr tablet Commonly known as:  GLUCOPHAGE-XR TAKE 2 TABLETS DAILY WITH  SUPPER   metoprolol succinate 50 MG 24 hr tablet Commonly known as:  TOPROL-XL Take 1 tablet (50 mg total) by mouth daily with supper. Take with or immediately following a meal.   SYSTANE OP Place 1 drop into both eyes 2 (two) times daily as needed (dry eyes/ burning).   Tdap 5-2.5-18.5 LF-MCG/0.5 injection Commonly known as:  BOOSTRIX Inject 0.5 mLs into the muscle once for 1 dose.   topiramate 50 MG tablet Commonly known as:  TOPAMAX Take 1.5 tablets (75 mg total) by mouth daily.   vitamin C 500 MG tablet Commonly known  as:  ASCORBIC ACID Take 500 mg by mouth 2 (two) times daily.   Zoster Vaccine Adjuvanted injection Commonly known as:  SHINGRIX Inject 0.5 mLs into the muscle once for 1 dose.        Review of Systems:  Review of Systems  Eyes: Positive for visual disturbance.  Musculoskeletal: Positive for arthralgias, gait problem and joint swelling.  Skin: Positive for wound.  Neurological: Positive for numbness.  Psychiatric/Behavioral: Positive for dysphoric mood.  All other systems reviewed and are negative.   Physical Exam: Vitals:   01/24/18 0941  BP: 124/60  Pulse: 90  Temp: 97.7 F (36.5 C)  TempSrc: Oral  SpO2: 96%  Weight:  255 lb (115.7 kg)  Height: _0  (1.778 m)   Body mass index is 36.59 kg/m. Physical Exam  Constitutional: He is oriented to person, place, and time. He appears well-developed and well-nourished. No distress.  HENT:  Head: Normocephalic and atraumatic.  Right Ear: External ear normal.  Left Ear: External ear normal.  Mouth/Throat: Oropharynx is clear and moist. No oropharyngeal exudate.  MMM; no oral thrush  Eyes: Pupils are equal, round, and reactive to light. EOM are normal. No scleral icterus.  Neck: Normal range of motion. Neck supple. Carotid bruit is not present. No tracheal deviation present. No thyromegaly present.  Cardiovascular: Normal rate, regular rhythm and intact distal pulses. Exam reveals no gallop and no friction rub.  Murmur (1/6 SEM) heard. +1 pitting  RLE edema. No right calf TTP. Left BKA  Pulmonary/Chest: Effort normal and breath sounds normal. No respiratory distress. He has no wheezes. He has no rales. He exhibits no tenderness.  No rhonchi  Abdominal: Soft. Bowel sounds are normal. He exhibits no distension and no mass. There is no hepatosplenomegaly or hepatomegaly. There is no tenderness. There is no rebound and no guarding. No hernia.  obese  Genitourinary: Prostate normal. Rectal exam shows no external hemorrhoid, no internal hemorrhoid, no fissure, no mass, no tenderness and guaiac negative stool. Prostate is not enlarged and not tender.  Genitourinary Comments: Chaperone present; prostate enlarged, smooth firm with no palpable mass, NT  Musculoskeletal: He exhibits edema and deformity (left BKA).  Lymphadenopathy:    He has no cervical adenopathy.  Neurological: He is alert and oriented to person, place, and time. He has normal reflexes.  Skin: Skin is warm and dry. No rash noted.  Right 2nd toe pressure sore with no secondary signs of infection  Psychiatric: His behavior is normal. Judgment and thought content normal. He exhibits a depressed mood.    Vitals reviewed.   Labs reviewed:  Basic Metabolic Panel: Recent Labs    04/07/17 1104 10/31/17 1019  NA 138 139  K 4.1 4.4  CL 107 106  CO2 21 29  GLUCOSE 94 117*  BUN 12 11  CREATININE 0.90 0.87  CALCIUM 8.6 9.3  TSH  --  1.37   Liver Function Tests: Recent Labs    04/07/17 1104 10/31/17 1019  AST  --  15  ALT 11 17  BILITOT  --  0.5  PROT  --  6.6   No results for input(s): LIPASE, AMYLASE in the last 8760 hours. No results for input(s): AMMONIA in the last 8760 hours. CBC: Recent Labs    10/31/17 1019  WBC 7.1  HGB 13.4  HCT 40.3  MCV 87.6  PLT CANCELED   Lipid Panel: Recent Labs    04/07/17 1104 10/31/17 1019  CHOL 116 127  HDL 60 85  LDLCALC 43 27  TRIG 66 66  CHOLHDL 1.9 1.5   Lab Results  Component Value Date   HGBA1C 6.1 (H) 10/31/2017    Procedures: No results found. ECG OBTAINED AND REVIEWED BY MYSELF: borderline 1st degree AVB @ 84 bpm, LAD, poor R wave progression. No acute ischemic changes. Low voltage in precordial leads. No significant change since Jul 2017.  Assessment/Plan   ICD-10-CM   1. Depression, unspecified depression type F32.9 amitriptyline (ELAVIL) 50 MG tablet   NEW  2. Essential hypertension I10 EKG 12-Lead  3. Chronic intractable headache, unspecified headache type R51 amitriptyline (ELAVIL) 50 MG tablet  4. Insomnia, unspecified type G47.00 amitriptyline (ELAVIL) 50 MG tablet  5. Mixed hyperlipidemia E78.2 Lipid Panel  6. High risk medication use Z79.899 BMP with eGFR([facility])    ALT  7. PAF (paroxysmal atrial fibrillation) (HCC) I48.0   8. Type 2 diabetes mellitus with diabetic polyneuropathy, without long-term current use of insulin ([facility]CC) E11.42   9. Diabetic ulcer of toe of right foot associated with type 2 diabetes mellitus, limited to breakdown of skin ([facility]CC) E11.621    L97.511     INCREASE AMITRIPTYLINE 50MG AT BEDTIME TO HELP WITH DEPRESSION, INSOMNIA AND HEADACHE  Continue other medications as  ordered  PLEASE SCHEDULE FASTING LAB APPT IN 1-2 WEEKS  Follow up with podiatry Dr Amalia Hailey for diabetic foot ulcer   Follow up with cardiology and endocrinology as scheduledd  Follow up in 3 mos for PAF, chronic LBP, HTN, hyperlipidemia     Aviannah Castoro S. Perlie Gold  [facility] [address] [place], [place] [address] [phone] Cell (Monday-Friday 8 AM - 5 PM) [phone] After 5 PM and follow prompts

## 2018-01-24 NOTE — Progress Notes (Signed)
Subjective:   Darrell Horne is a 72 y.o. male who presents for Medicare Annual/Subsequent preventive examination.  Last AWV- 06/04/2015  Objective:    Vitals: BP 124/60 (BP Location: Left Arm, Patient Position: Sitting)   Pulse 90   Temp 97.7 F (36.5 C) (Oral)   Ht 5\' 10"  (1.778 m)   Wt 255 lb (115.7 kg)   SpO2 96%   BMI 36.59 kg/m   Body mass index is 36.59 kg/m.  Provider notified  Advanced Directives 01/24/2018 01/24/2018 04/07/2017 11/11/2016 11/11/2016 08/05/2016 05/06/2016  Does Patient Have a Medical Advance Directive? Yes Yes Yes Yes Yes Yes Yes  Type of [place] administrator Power of [place] of [place] will Living will  Does patient want to make changes to medical advance directive? - No - Patient declined No - Patient declined - - No - Patient declined No - Patient declined  Copy of [place] in Chart? Yes Yes Yes Yes Yes No - copy requested No - copy requested  Would patient like information on creating a medical advance directive? - - - - - - -    Tobacco Social History   Tobacco Use  Smoking Status Former Smoker  . Years: 12.00  . Last attempt to quit: 10/10/1981  . Years since quitting: 36.3  Smokeless Tobacco Never Used     Counseling given: Not Answered   Clinical Intake:  Pre-visit preparation completed: No  Pain : 0-10 Pain Score: 10-Worst pain ever Pain Type: Chronic pain Pain Location: Back Pain Orientation: Lower Pain Descriptors / Indicators: Aching Pain Onset: More than a month ago Pain Frequency: Constant     Nutritional Risks: None Diabetes: Yes CBG done?: No Did pt. bring in CBG monitor from home?: No  How often do you need to have someone help you when you read instructions, pamphlets, or other written materials from your doctor or pharmacy?: 1 - Never What is the last grade level you completed in  school?: 11th grade  Interpreter Needed?: No  Information entered by :: [name], RN  Past Medical History:  Diagnosis Date  . Chest pain   . Diabetes mellitus   . DM2 (diabetes mellitus, type 2) ([place])   . GERD (gastroesophageal reflux disease)   . HLD (hyperlipidemia)   . HTN (hypertension)   . PAF (paroxysmal atrial fibrillation) ([place])   . Palpitations    Past Surgical History:  Procedure Laterality Date  . APPENDECTOMY    . CHOLECYSTECTOMY    . COLONOSCOPY     Dr. Michail Sermon  . HERNIA REPAIR    . LEG AMPUTATION Left [id number]   Dr. Kendell Bane  . SHOULDER SURGERY  [id number]   Dr. Meridee Score  . TOTAL ANKLE ARTHROPLASTY  [id number]   Family History  Problem Relation Age of Onset  . Heart disease Mother        Living  . Stroke Father 13       Deceased  . Diabetes Sister   . Cancer Sister    Social History   Socioeconomic History  . Marital status: Divorced    Spouse name: Not on file  . Number of children: 4  . Years of education: Not on file  . Highest education level: Not on file  Occupational History  . Occupation: retired Investment banker, operational  . Financial resource strain: Not hard at all  . Food  insecurity:    Worry: Never true    Inability: Never true  . Transportation needs:    Medical: No    Non-medical: No  Tobacco Use  . Smoking status: Former Smoker    Years: 12.00    Last attempt to quit: 10/10/1981    Years since quitting: 36.3  . Smokeless tobacco: Never Used  Substance and Sexual Activity  . Alcohol use: No    Comment: Previously drinking a fifth of liquor every two days x 13 years  . Drug use: No  . Sexual activity: Never  Lifestyle  . Physical activity:    Days per week: 0 days    Minutes per session: 0 min  . Stress: To some extent  Relationships  . Social connections:    Talks on phone: Once a week    Gets together: Once a week    Attends religious service: More than 4 times per year    Active member of club or  organization: No    Attends meetings of clubs or organizations: Never    Relationship status: Divorced  Other Topics Concern  . Not on file  Social History Narrative   Diet:   Do you drink/eat things with caffeine?  Yes   Marital status:  Divorced.  What year were you married?  2001   Do you live in a house, assisted living, condo, apartment, trailer, etc.?  Apartment   Is it one or more stories?  1 story   How many persons live in your home?  1   Do you have any pets in your home?  No   Current or past profession:  Truck driver   Do you exercise?  Some  Type and how often:  Walking    Outpatient Encounter Medications as of 01/24/2018  Medication Sig  . amitriptyline (ELAVIL) 25 MG tablet Take 1 tablet (25 mg total) by mouth at bedtime.  Marland Kitchen apixaban (ELIQUIS) 5 MG TABS tablet Take 1 tablet (5 mg total) by mouth 2 (two) times daily.  Marland Kitchen atorvastatin (LIPITOR) 40 MG tablet TAKE 1 TABLET DAILY  . b complex vitamins tablet Take 1 tablet by mouth daily.  Marland Kitchen gabapentin (NEURONTIN) 600 MG tablet Take 1 tablet (600 mg total) by mouth 3 (three) times daily.  Marland Kitchen gentamicin cream (GARAMYCIN) 0.1 % Apply 1 application topically 3 (three) times daily.  . metFORMIN (GLUCOPHAGE-XR) 500 MG 24 hr tablet TAKE 2 TABLETS DAILY WITH  SUPPER  . metoprolol succinate (TOPROL-XL) 50 MG 24 hr tablet Take 1 tablet (50 mg total) by mouth daily with supper. Take with or immediately following a meal.  . Polyethyl Glycol-Propyl Glycol (SYSTANE OP) Place 1 drop into both eyes 2 (two) times daily as needed (dry eyes/ burning).  . Tdap (BOOSTRIX) 5-2.5-18.5 LF-MCG/0.5 injection Inject 0.5 mLs into the muscle once for 1 dose.  . topiramate (TOPAMAX) 50 MG tablet Take 1.5 tablets (75 mg total) by mouth daily.  . vitamin C (ASCORBIC ACID) 500 MG tablet Take 500 mg by mouth 2 (two) times daily.   Marland Kitchen Zoster Vaccine Adjuvanted [facility]) injection Inject 0.5 mLs into the muscle once for 1 dose.  . [DISCONTINUED] Tdap (BOOSTRIX)  5-2.5-18.5 LF-MCG/0.5 injection Inject 0.5 mLs into the muscle once.  . [DISCONTINUED] Zoster Vaccine Adjuvanted [facility]) injection Inject 0.5 mLs into the muscle once.   No facility-administered encounter medications on file as of 01/24/2018.     Activities of Daily Living In your present state of health, do you  have any difficulty performing the following activities: 01/24/2018  Hearing? Y  Vision? Y  Difficulty concentrating or making decisions? Y  Walking or climbing stairs? Y  Dressing or bathing? Y  Doing errands, shopping? Y  Preparing Food and eating ? N  Using the Toilet? N  In the past six months, have you accidently leaked urine? Y  Do you have problems with loss of bowel control? N  Managing your Medications? N  Managing your Finances? N  Housekeeping or managing your Housekeeping? N  Some recent data might be hidden    Patient Care Team: Gildardo Cranker, DO as PCP - General (Internal Medicine) Clent Jacks, MD as Consulting Physician (Ophthalmology)   Assessment:   This is a routine wellness examination for [place].  Exercise Activities and Dietary recommendations Current Exercise Habits: The patient does not participate in regular exercise at present, Exercise limited by: orthopedic condition(s)  Goals    None      Fall Risk Fall Risk  01/24/2018 01/01/2018 06/30/2017 04/07/2017 12/26/2016  Falls in the past year? Yes Yes No No Yes  Number falls in past yr: 1 1 - - 1  Injury with Fall? No No - - No  Comment - - - - -  Risk Factor Category  - - - - -  Risk for fall due to : - Impaired balance/gait;Impaired mobility - - Impaired balance/gait  Follow up - Falls evaluation completed;Education provided;Falls prevention discussed - - Falls evaluation completed;Education provided;Falls prevention discussed   Is the patient's home free of loose throw rugs in walkways, pet beds, electrical cords, etc?   yes      Grab bars in the bathroom? yes      Handrails on the  stairs?   yes      Adequate lighting?   yes  Timed Get Up and Go Performed: 25 seconds, fall risk  Depression Screen PHQ 2/9 Scores 01/24/2018 11/11/2016 12/09/2015  PHQ - 2 Score 2 1 0  PHQ- 9 Score 5 - -  Provider notified  Cognitive Function MMSE - Mini Mental State Exam 01/24/2018 11/11/2016 12/09/2015  Not completed: - - (No Data)  Orientation to time 5 5 5   Orientation to Place 5 5 5   Registration 3 3 3   Attention/ Calculation 5 4 5   Recall 2 3 2   Language- name 2 objects 2 2 2   Language- repeat 1 1 1   Language- follow 3 step command 3 3 3   Language- read & follow direction 1 1 1   Write a sentence 1 1 1   Copy design 0 1 1  Total score 28 29 29         Immunization History  Administered Date(s) Administered  . DTaP 10/10/2014  . Influenza,inj,Quad PF,6+ Mos 08/05/2016  . Influenza-Unspecified 10/10/2014  . Pneumococcal Conjugate-13 11/11/2016  . Pneumococcal-Unspecified 10/10/2014    Qualifies for Shingles Vaccine? Due, educated and prescription sent to pharmacy  Screening Tests Health Maintenance  Topic Date Due  . Hepatitis C Screening  01-Oct-1946  . TETANUS/TDAP  11/10/2018 (Originally 09/13/1965)  . URINE MICROALBUMIN  04/19/2018  . HEMOGLOBIN A1C  04/30/2018  . INFLUENZA VACCINE  05/10/2018  . OPHTHALMOLOGY EXAM  10/18/2018  . FOOT EXAM  10/31/2018  . COLONOSCOPY  08/19/2024  . PNA vac Low Risk Adult  Completed   Cancer Screenings: Lung: Low Dose CT Chest recommended if Age 15-80 years, 30 pack-year currently smoking OR have quit w/in 15years. Patient does not qualify. Colorectal: up to date  Additional  Screenings:  Hepatitis C Screening:declined      Plan:    I have personally reviewed and addressed the Medicare Annual Wellness questionnaire and have noted the following in the patient's chart:  A. Medical and social history B. Use of alcohol, tobacco or illicit drugs  C. Current medications and supplements D. Functional ability and status E.    Nutritional status F.  Physical activity G. Advance directives H. List of other physicians I.  Hospitalizations, surgeries, and ER visits in previous 12 months J.  [place] to include hearing, vision, cognitive, depression L. Referrals and appointments - none  In addition, I have reviewed and discussed with patient certain preventive protocols, quality metrics, and best practice recommendations. A written personalized care plan for preventive services as well as general preventive health recommendations were provided to patient.  See attached scanned questionnaire for additional information.   Signed,   [name], RN Nurse Health Advisor  Patient Concerns: None

## 2018-01-24 NOTE — Patient Instructions (Addendum)
INCREASE AMITRIPTYLINE 50MG  AT BEDTIME TO HELP WITH DEPRESSION, INSOMNIA AND HEADACHE  Continue other medications as ordered  PLEASE SCHEDULE FASTING LAB APPT IN 1-2 WEEKS  Follow up with podiatry Dr Amalia Hailey for diabetic foot ulcer   Follow up with cardiology and endocrinology as scheduledd  Follow up in 3 mos for PAF, chronic LBP, HTN, hyperlipidemia

## 2018-01-25 ENCOUNTER — Telehealth: Payer: Self-pay

## 2018-01-25 NOTE — Telephone Encounter (Signed)
Patient called to indicate that he called [facility] to get metoprolol refilled and they told him that we need to contact them for they have tried to contact us x 2 with no success.  I informed patient that we have not heard from [facility] and I will give them a call to a status on rx that was sent to them on 01/02/18 with an additional refill. Patient aware I will call him back.  I called [employer], spoke with representative that states the instructions need clarification for they currently have a double set of instructions: 1 by mouth daily with supper. Take with or immediately following a meal.   Rx instructions will be simplified to take 1 by mouth daily immediately following a meal .   I called patient to inform him of encounter with [employer] and rx will be sent to him, patient verbalized understanding and expressed his gratitude for message being handled promptly   S.Chrae B/CMA

## 2018-01-29 DIAGNOSIS — H2512 Age-related nuclear cataract, left eye: Secondary | ICD-10-CM | POA: Diagnosis not present

## 2018-01-29 DIAGNOSIS — H25812 Combined forms of age-related cataract, left eye: Secondary | ICD-10-CM | POA: Diagnosis not present

## 2018-01-31 ENCOUNTER — Encounter: Payer: Self-pay | Admitting: Podiatry

## 2018-01-31 ENCOUNTER — Ambulatory Visit: Payer: Medicare HMO | Admitting: Podiatry

## 2018-01-31 DIAGNOSIS — B351 Tinea unguium: Secondary | ICD-10-CM

## 2018-01-31 DIAGNOSIS — E0843 Diabetes mellitus due to underlying condition with diabetic autonomic (poly)neuropathy: Secondary | ICD-10-CM | POA: Diagnosis not present

## 2018-01-31 DIAGNOSIS — M79676 Pain in unspecified toe(s): Secondary | ICD-10-CM

## 2018-02-04 NOTE — Progress Notes (Signed)
   SUBJECTIVE 72 year old male with PMHx of diabetes mellitus and left BKA presents to office today complaining of elongated, thickened nails that cause pain while ambulating in shoes. He is unable to trim his own nails.  He also reports some scabbing around the 2nd right toe. He has been applying gentamicin cream to the area that has seemed to help. Patient is here for further evaluation and treatment.   Past Medical History:  Diagnosis Date  . Chest pain   . Diabetes mellitus   . DM2 (diabetes mellitus, type 2) ([place])   . GERD (gastroesophageal reflux disease)   . HLD (hyperlipidemia)   . HTN (hypertension)   . PAF (paroxysmal atrial fibrillation) ([place])   . Palpitations     OBJECTIVE General Patient is awake, alert, and oriented x 3 and in no acute distress. Derm Skin is dry and supple of RLE. Negative open lesions or macerations. Remaining integument unremarkable. Nails are tender, long, thickened and dystrophic with subungual debris, consistent with onychomycosis, 1-5 right. No signs of infection noted. Vasc  DP and PT pedal pulses palpable of RLE. Temperature gradient within normal limits.  Neuro Epicritic and protective threshold sensation diminished of RLE.  Musculoskeletal Exam No symptomatic pedal deformities noted of RLE. Muscular strength within normal limits.  ASSESSMENT 1. Diabetes Mellitus w/ peripheral neuropathy 2. Onychomycosis of nail due to dermatophyte right 3. Pain in right foot   PLAN OF CARE 1. Patient evaluated today. 2. Instructed to maintain good pedal hygiene and foot care. Stressed importance of controlling blood sugar.  3. Mechanical debridement of nails 1-5 of the right foot performed using a nail nipper. Filed with dremel without incident.  4. Return to clinic in 3 mos.     Edrick Kins, DPM [facility]  Dr. Edrick Kins, [address]                                        [place], [place] [address]                  Office [phone]  Fax [phone]

## 2018-02-08 DIAGNOSIS — H2511 Age-related nuclear cataract, right eye: Secondary | ICD-10-CM | POA: Diagnosis not present

## 2018-02-26 DIAGNOSIS — H2511 Age-related nuclear cataract, right eye: Secondary | ICD-10-CM | POA: Diagnosis not present

## 2018-03-27 ENCOUNTER — Other Ambulatory Visit: Payer: Self-pay | Admitting: Internal Medicine

## 2018-03-28 ENCOUNTER — Telehealth: Payer: Self-pay | Admitting: *Deleted

## 2018-03-28 DIAGNOSIS — Z89512 Acquired absence of left leg below knee: Secondary | ICD-10-CM

## 2018-03-28 DIAGNOSIS — E1142 Type 2 diabetes mellitus with diabetic polyneuropathy: Secondary | ICD-10-CM

## 2018-03-28 NOTE — Telephone Encounter (Signed)
Done

## 2018-03-28 NOTE — Telephone Encounter (Signed)
Rx Faxed  

## 2018-03-28 NOTE — Telephone Encounter (Signed)
Darrell Horne, wife called and stated that patient needs a Rx for Left Leg Prosthetic Liners. Stated that [facility] [phone] told her the Rx had to come from PCP. Please Advise.

## 2018-03-29 ENCOUNTER — Telehealth: Payer: Self-pay | Admitting: *Deleted

## 2018-03-29 DIAGNOSIS — Z89512 Acquired absence of left leg below knee: Secondary | ICD-10-CM

## 2018-03-29 DIAGNOSIS — E1142 Type 2 diabetes mellitus with diabetic polyneuropathy: Secondary | ICD-10-CM

## 2018-03-29 NOTE — Telephone Encounter (Signed)
[facility] [phone] Fax: [phone]

## 2018-03-29 NOTE — Telephone Encounter (Signed)
Patient wife, Webb Silversmith called and stated that patient also needs an order faxed to [facility] for Left Leg Sock and Socket. Order printed and placed in Dr. Vale Haven folder to review and sign.   [facility] [phone] Fax: [phone]

## 2018-04-02 ENCOUNTER — Encounter: Payer: Medicare HMO | Admitting: Podiatry

## 2018-04-08 NOTE — Progress Notes (Signed)
This encounter was created in error - please disregard.

## 2018-04-15 ENCOUNTER — Emergency Department (HOSPITAL_COMMUNITY): Payer: Medicare HMO

## 2018-04-15 ENCOUNTER — Emergency Department (HOSPITAL_COMMUNITY)
Admission: EM | Admit: 2018-04-15 | Discharge: 2018-04-15 | Disposition: A | Discharge status: Home / Self Care | Payer: Medicare HMO | Attending: Emergency Medicine | Admitting: Emergency Medicine

## 2018-04-15 ENCOUNTER — Other Ambulatory Visit: Payer: Self-pay

## 2018-04-15 DIAGNOSIS — E119 Type 2 diabetes mellitus without complications: Secondary | ICD-10-CM | POA: Diagnosis not present

## 2018-04-15 DIAGNOSIS — W010XXA Fall on same level from slipping, tripping and stumbling without subsequent striking against object, initial encounter: Secondary | ICD-10-CM | POA: Insufficient documentation

## 2018-04-15 DIAGNOSIS — M545 Low back pain, unspecified: Secondary | ICD-10-CM

## 2018-04-15 DIAGNOSIS — M25511 Pain in right shoulder: Secondary | ICD-10-CM | POA: Diagnosis not present

## 2018-04-15 DIAGNOSIS — W19XXXA Unspecified fall, initial encounter: Secondary | ICD-10-CM | POA: Diagnosis not present

## 2018-04-15 DIAGNOSIS — I959 Hypotension, unspecified: Secondary | ICD-10-CM | POA: Diagnosis not present

## 2018-04-15 DIAGNOSIS — R51 Headache: Secondary | ICD-10-CM | POA: Diagnosis not present

## 2018-04-15 DIAGNOSIS — Z79899 Other long term (current) drug therapy: Secondary | ICD-10-CM | POA: Insufficient documentation

## 2018-04-15 DIAGNOSIS — M25551 Pain in right hip: Secondary | ICD-10-CM | POA: Diagnosis not present

## 2018-04-15 DIAGNOSIS — I10 Essential (primary) hypertension: Secondary | ICD-10-CM | POA: Diagnosis not present

## 2018-04-15 DIAGNOSIS — M542 Cervicalgia: Secondary | ICD-10-CM

## 2018-04-15 DIAGNOSIS — Z7901 Long term (current) use of anticoagulants: Secondary | ICD-10-CM | POA: Diagnosis not present

## 2018-04-15 DIAGNOSIS — Y998 Other external cause status: Secondary | ICD-10-CM | POA: Diagnosis not present

## 2018-04-15 DIAGNOSIS — I480 Paroxysmal atrial fibrillation: Secondary | ICD-10-CM | POA: Diagnosis not present

## 2018-04-15 DIAGNOSIS — Z7984 Long term (current) use of oral hypoglycemic drugs: Secondary | ICD-10-CM | POA: Insufficient documentation

## 2018-04-15 DIAGNOSIS — Y9301 Activity, walking, marching and hiking: Secondary | ICD-10-CM | POA: Insufficient documentation

## 2018-04-15 DIAGNOSIS — S199XXA Unspecified injury of neck, initial encounter: Secondary | ICD-10-CM | POA: Diagnosis not present

## 2018-04-15 DIAGNOSIS — R1084 Generalized abdominal pain: Secondary | ICD-10-CM | POA: Diagnosis not present

## 2018-04-15 DIAGNOSIS — Z87891 Personal history of nicotine dependence: Secondary | ICD-10-CM | POA: Insufficient documentation

## 2018-04-15 DIAGNOSIS — R42 Dizziness and giddiness: Secondary | ICD-10-CM | POA: Diagnosis not present

## 2018-04-15 DIAGNOSIS — S0990XA Unspecified injury of head, initial encounter: Secondary | ICD-10-CM | POA: Diagnosis not present

## 2018-04-15 DIAGNOSIS — E785 Hyperlipidemia, unspecified: Secondary | ICD-10-CM | POA: Insufficient documentation

## 2018-04-15 DIAGNOSIS — Y929 Unspecified place or not applicable: Secondary | ICD-10-CM | POA: Insufficient documentation

## 2018-04-15 DIAGNOSIS — S79911A Unspecified injury of right hip, initial encounter: Secondary | ICD-10-CM | POA: Diagnosis not present

## 2018-04-15 LAB — BASIC METABOLIC PANEL
Anion gap: 5 (ref 5–15)
BUN: 8 mg/dL (ref 8–23)
CO2: 24 mmol/L (ref 22–32)
Calcium: 8.6 mg/dL — ABNORMAL LOW (ref 8.9–10.3)
Chloride: 109 mmol/L (ref 98–111)
Creatinine, Ser: 0.9 mg/dL (ref 0.61–1.24)
GFR calc Af Amer: >60 mL/min (ref >60)
GFR calc non Af Amer: >60 mL/min (ref >60)
Glucose, Bld: 105 mg/dL — ABNORMAL HIGH (ref 70–99)
Potassium: 4.1 mmol/L (ref 3.5–5.1)
Sodium: 138 mmol/L (ref 135–145)

## 2018-04-15 LAB — CBC WITH DIFFERENTIAL/PLATELET
Abs Immature Granulocytes: 0 10*3/uL (ref 0.0–0.1)
Basophils Absolute: 0 10*3/uL (ref 0.0–0.1)
Basophils Relative: 0 %
Eosinophils Absolute: 0.1 10*3/uL (ref 0.0–0.7)
Eosinophils Relative: 2 %
HCT: 41.8 % (ref 39.0–52.0)
Hemoglobin: 13.2 g/dL (ref 13.0–17.0)
Immature Granulocytes: 0 %
Lymphocytes Relative: 22 %
Lymphs Abs: 1.5 10*3/uL (ref 0.7–4.0)
MCH: 30.3 pg (ref 26.0–34.0)
MCHC: 31.6 g/dL (ref 30.0–36.0)
MCV: 96.1 fL (ref 78.0–100.0)
Monocytes Absolute: 0.5 10*3/uL (ref 0.1–1.0)
Monocytes Relative: 8 %
Neutro Abs: 4.6 10*3/uL (ref 1.7–7.7)
Neutrophils Relative %: 68 %
Platelets: 152 10*3/uL (ref 150–400)
RBC: 4.35 MIL/uL (ref 4.22–5.81)
RDW: 14.5 % (ref 11.5–15.5)
WBC: 6.7 10*3/uL (ref 4.0–10.5)

## 2018-04-15 LAB — CBG MONITORING, ED: Glucose-Capillary: 88 mg/dL (ref 70–99)

## 2018-04-15 MED ORDER — MORPHINE SULFATE (PF) 4 MG/ML IV SOLN: 4.0000 mg | Freq: Once | INTRAVENOUS | Status: DC

## 2018-04-15 MED ORDER — HYDROCODONE-ACETAMINOPHEN 5-325 MG PO TABS: 1.0000 | ORAL_TABLET | Freq: Once | ORAL | Status: DC

## 2018-04-15 NOTE — ED Notes (Signed)
Blood for labs drawn from dorsal aspect of rgt hand via butterfly needle x1 attempt

## 2018-04-15 NOTE — ED Notes (Signed)
Reports pain to RUE, left elbow, midline cervical spine most significant at base of neck; also pain to rgt mid back

## 2018-04-15 NOTE — ED Triage Notes (Signed)
[place] in yard this am while preparing to go to church; initially reported mechanical fall then reports near syncope; c/o midline cervical spine pain and rgt back pain; denies LOC; on blood thinner

## 2018-04-15 NOTE — ED Notes (Signed)
PA in to assess 

## 2018-04-15 NOTE — ED Provider Notes (Signed)
[facility] EMERGENCY DEPARTMENT Provider Note   CSN: [id number] Arrival date & time: 04/15/18  0957     History   Chief Complaint Chief Complaint  Patient presents with  . Fall    HPI Darrell Horne is a 72 y.o. male who presents with a fall. PMH significant for Type 2 DM, chronic pain, GERD, HTN, HLD, PAF on Eliquis, L BKA with prosthesis. The patient states that he got up early this morning to go to church. He was walking and felt dizzy and became off balance and fell backwards. Nursing is concerned that event was near syncopal however the patient does not really describe this to me. He is unable to clearly describe whether he felt dizzy, near syncopal, or whether it was a mechanical fall. He was unable to get up off the ground so EMS was called. He reports pain in his neck, low back, and R shoulder. He also has chronic pain in these areas. He denies extremity weakness, numbness, tingling. No headache, vision changes, current dizziness, chest pain, SOB, abdominal pain, N/V. He did not lose consciousness.   HPI  Past Medical History:  Diagnosis Date  . Chest pain   . Diabetes mellitus   . DM2 (diabetes mellitus, type 2) ([place])   . GERD (gastroesophageal reflux disease)   . HLD (hyperlipidemia)   . HTN (hypertension)   . PAF (paroxysmal atrial fibrillation) ([place])   . Palpitations     Patient Active Problem List   Diagnosis Date Noted  . Hx of BKA, left ([place]) 11/11/2016  . High risk medication use 11/11/2016  . Syncope 04/17/2016  . Diabetic polyneuropathy associated with type 2 diabetes mellitus ([place]) 12/23/2015  . Alcoholic peripheral neuropathy ([place]) 12/23/2015  . Sensory ataxia 12/23/2015  . Headache 11/04/2015  . Chronic low back pain 11/04/2015  . Insomnia 11/04/2015  . Neuropathy due to medical condition ([place]) 04/08/2015  . Diabetes ([place]) 04/09/2014  . PAF (paroxysmal atrial fibrillation) ([place])   . DM2 (diabetes mellitus, type 2) ([place])   . HTN  (hypertension)   . Palpitations   . HLD (hyperlipidemia)     Past Surgical History:  Procedure Laterality Date  . APPENDECTOMY    . CHOLECYSTECTOMY    . COLONOSCOPY     Dr. Michail Sermon  . HERNIA REPAIR    . LEG AMPUTATION Left [id number]   Dr. Kendell Bane  . SHOULDER SURGERY  [phone]   Dr. Meridee Score  . TOTAL ANKLE ARTHROPLASTY  [id number]        Home Medications    Prior to Admission medications   Medication Sig Start Date End Date Taking? Authorizing Provider  amitriptyline (ELAVIL) 50 MG tablet Take 1 tablet (50 mg total) by mouth at bedtime. 01/24/18   Gildardo Cranker, DO  apixaban (ELIQUIS) 5 MG TABS tablet Take 1 tablet (5 mg total) by mouth 2 (two) times daily. 10/31/17   Gildardo Cranker, DO  atorvastatin (LIPITOR) 40 MG tablet TAKE 1 TABLET DAILY 03/27/18   Gildardo Cranker, DO  b complex vitamins tablet Take 1 tablet by mouth daily.    [provider]  gabapentin (NEURONTIN) 600 MG tablet Take 1 tablet (600 mg total) by mouth 3 (three) times daily. 01/01/18   Narda Amber K, DO  gentamicin cream (GARAMYCIN) 0.1 % Apply 1 application topically 3 (three) times daily. 11/27/17   Edrick Kins, DPM  metFORMIN (GLUCOPHAGE-XR) 500 MG 24 hr tablet TAKE 2 TABLETS DAILY WITH  SUPPER 09/21/17  Renato Shin, MD  metoprolol succinate (TOPROL-XL) 50 MG 24 hr tablet Take 1 tablet (50 mg total) by mouth daily with supper. Take with or immediately following a meal. 01/02/18   Gildardo Cranker, DO  Polyethyl Glycol-Propyl Glycol (SYSTANE OP) Place 1 drop into both eyes 2 (two) times daily as needed (dry eyes/ burning).    [provider]  topiramate (TOPAMAX) 50 MG tablet Take 1.5 tablets (75 mg total) by mouth daily. 01/01/18   Narda Amber K, DO  vitamin C (ASCORBIC ACID) 500 MG tablet Take 500 mg by mouth 2 (two) times daily.     [provider]    Family History Family History  Problem Relation Age of Onset  . Heart disease Mother        Living  . Stroke Father  4       Deceased  . Diabetes Sister   . Cancer Sister     Social History Social History   Tobacco Use  . Smoking status: Former Smoker    Years: 12.00    Last attempt to quit: 10/10/1981    Years since quitting: 36.5  . Smokeless tobacco: Never Used  Substance Use Topics  . Alcohol use: No    Comment: Previously drinking a fifth of liquor every two days x 13 years  . Drug use: No     Allergies   Patient has no known allergies.   Review of Systems Review of Systems  Eyes: Negative for visual disturbance.  Respiratory: Negative for shortness of breath.   Cardiovascular: Negative for chest pain.  Gastrointestinal: Negative for abdominal pain.  Musculoskeletal: Positive for arthralgias, back pain, gait problem, myalgias and neck pain. Negative for joint swelling.  Skin: Positive for wound.  Neurological: Negative for dizziness, weakness, numbness and headaches.  All other systems reviewed and are negative.    Physical Exam Updated Vital Signs BP 111/68 (BP Location: Right Arm)   Pulse 70   Temp 99.1 F (37.3 C) (Oral)   Resp 20   Ht 5\' 10"  (1.778 m)   Wt 114.3 kg (252 lb)   SpO2 95%   BMI 36.16 kg/m   Physical Exam  Constitutional: He is oriented to person, place, and time. He appears well-developed and well-nourished. No distress.  Calm, cooperative. In C-collar, obese  HENT:  Head: Normocephalic and atraumatic.  Eyes: Pupils are equal, round, and reactive to light. Conjunctivae are normal. Right eye exhibits no discharge. Left eye exhibits no discharge. No scleral icterus.  Neck: Normal range of motion.  Midline C-spine tenderness  Cardiovascular: Normal rate and regular rhythm.  Pulmonary/Chest: Effort normal and breath sounds normal. No respiratory distress.  Abdominal: Soft. Bowel sounds are normal. He exhibits no distension. There is no tenderness.  Musculoskeletal:  Small skin tear over the R elbow  Back: Inspection: No masses, deformity, or  rash Palpation: Lumbar midline spinal tenderness. No paraspinal muscle tenderness. Strength: 5/5 in lower extremities and normal plantar and dorsiflexion Sensation: Intact sensation with light touch in lower extremities bilaterally SLR: Negative seated straight leg raise Gait: Normal gait   Neurological: He is alert and oriented to person, place, and time.  Skin: Skin is warm and dry.  Psychiatric: He has a normal mood and affect. His behavior is normal.  Nursing note and vitals reviewed.    ED Treatments / Results  Labs (all labs ordered are listed, but only abnormal results are displayed) Labs Reviewed  BASIC METABOLIC PANEL - Abnormal; Notable for the following  components:      Result Value   Glucose, Bld 105 (*)    Calcium 8.6 (*)    All other components within normal limits  CBC WITH DIFFERENTIAL/PLATELET  CBG MONITORING, ED    EKG None  Radiology Dg Lumbar Spine Complete  Result Date: 04/15/2018 CLINICAL DATA:  Post fall, now with back pain EXAM: LUMBAR SPINE - COMPLETE 4+ VIEW COMPARISON:  Lumbar spine MRI-05/20/2015 FINDINGS: There are 5 non rib-bearing lumbar type vertebral bodies There is mild straightening of the expected lumbar lordosis. No anterolisthesis or retrolisthesis. Lumbar vertebral body heights appear preserved. Mild to moderate multilevel lumbar spine DDD, worse at L1-L2 and L2-L3 with disc space height loss, endplate irregularity and sclerosis. Limited visualization of the bilateral SI joints and hips is normal. Calcified atherosclerotic plaque within the abdominal aorta. Regional bowel gas pattern is normal. Punctate phleboliths overlie the right hemipelvis. IMPRESSION: 1. Mild straightening of the expected lumbar lordosis, nonspecific though could be seen in the setting of muscle spasm. Otherwise, no acute findings. 2. Mild to moderate multilevel lumbar spine DDD likely progressed compared to remote lumbar spine MRI performed 05/2015. 3.  Aortic  Atherosclerosis (ICD10-I70.0). Electronically Signed   By: Sandi Mariscal M.D.   On: 04/15/2018 11:28   Dg Shoulder Right  Result Date: 04/15/2018 CLINICAL DATA:  Post fall, now with right shoulder pain EXAM: RIGHT SHOULDER - 2+ VIEW COMPARISON:  Chest radiograph - 04/17/2016 FINDINGS: No definite fracture or dislocation. Mild degenerative change of the glenohumeral and acromioclavicular joints with joint space loss, sclerotic subchondral sclerosis and osteophytosis. No evidence of calcific tendinitis. Limited visualization of the adjacent thorax is normal. IMPRESSION: No definite fracture or dislocation. Electronically Signed   By: Sandi Mariscal M.D.   On: 04/15/2018 11:26   Ct Head Wo Contrast  Result Date: 04/15/2018 CLINICAL DATA:  Pain following fall EXAM: CT HEAD WITHOUT CONTRAST CT CERVICAL SPINE WITHOUT CONTRAST TECHNIQUE: Multidetector CT imaging of the head and cervical spine was performed following the standard protocol without intravenous contrast. Multiplanar CT image reconstructions of the cervical spine were also generated. COMPARISON:  Cervical spine CT January 16, 2012; head CT April 17, 2016 FINDINGS: CT HEAD FINDINGS Brain: Slight generalized atrophy is stable. There is no intracranial mass, hemorrhage, extra-axial fluid collection, or midline shift. There is slight small vessel disease in the centra semiovale bilaterally. Elsewhere gray-white compartments appear normal. No acute infarct is demonstrable. Vascular: There is no appreciable hyperdense vessel. There is calcification in each carotid siphon region. Skull: Bony calvarium appears intact. Sinuses/Orbits: There is mucosal thickening in multiple ethmoid air cells. There are retention cysts in the right sphenoid sinus. There is mild mucosal thickening in the frontal sinus regions. Other paranasal sinuses are clear. Orbits appear symmetric bilaterally. Other: Mastoid air cells are clear. CT CERVICAL SPINE FINDINGS Alignment: There is no  appreciable spondylolisthesis. Skull base and vertebrae: Skull base and craniocervical junction regions appear normal. Bones are somewhat osteoporotic. No fracture is demonstrable. There are no blastic or lytic bone lesions. Soft tissues and spinal canal: Prevertebral soft tissues and predental space regions are normal. There are no paraspinous lesions. There is no evident cord or canal hematoma. Disc levels: There is moderately severe disc space narrowing at C6-5 6, C6-7, and C7-T1. There are anterior osteophytes at C4, C5, C6, and C7. There is multilevel facet hypertrophy and osteoarthritic change. There is exit foraminal narrowing due to bony hypertrophy on the right at C3-4 with effacement of the exiting nerve root in  this area. There is exit foraminal narrowing, moderate, at C5-6 bilaterally due to bony hypertrophy. There is exit foraminal narrowing at C6-7 bilaterally, more severe on the left than on the right, with effacement of exiting nerve roots at this level bilaterally. There is no frank disc extrusion or high-grade stenosis. Upper chest: Limited assessment without abnormality. Other: There is atherosclerotic calcification in each carotid artery. IMPRESSION: CT head: Mild atrophy with mild periventricular small vessel disease. No acute infarct. No mass or hemorrhage. There are foci of arterial vascular calcification. There are areas of paranasal sinus disease. CT cervical spine: No fracture or spondylolisthesis. Multilevel arthropathy with areas of nerve root effacement due to bony hypertrophy. No frank disc extrusion or stenosis. There is bilateral carotid artery calcification. Electronically Signed   By: Lowella Grip III M.D.   On: 04/15/2018 11:54   Ct Cervical Spine Wo Contrast  Result Date: 04/15/2018 CLINICAL DATA:  Pain following fall EXAM: CT HEAD WITHOUT CONTRAST CT CERVICAL SPINE WITHOUT CONTRAST TECHNIQUE: Multidetector CT imaging of the head and cervical spine was performed following  the standard protocol without intravenous contrast. Multiplanar CT image reconstructions of the cervical spine were also generated. COMPARISON:  Cervical spine CT January 16, 2012; head CT April 17, 2016 FINDINGS: CT HEAD FINDINGS Brain: Slight generalized atrophy is stable. There is no intracranial mass, hemorrhage, extra-axial fluid collection, or midline shift. There is slight small vessel disease in the centra semiovale bilaterally. Elsewhere gray-white compartments appear normal. No acute infarct is demonstrable. Vascular: There is no appreciable hyperdense vessel. There is calcification in each carotid siphon region. Skull: Bony calvarium appears intact. Sinuses/Orbits: There is mucosal thickening in multiple ethmoid air cells. There are retention cysts in the right sphenoid sinus. There is mild mucosal thickening in the frontal sinus regions. Other paranasal sinuses are clear. Orbits appear symmetric bilaterally. Other: Mastoid air cells are clear. CT CERVICAL SPINE FINDINGS Alignment: There is no appreciable spondylolisthesis. Skull base and vertebrae: Skull base and craniocervical junction regions appear normal. Bones are somewhat osteoporotic. No fracture is demonstrable. There are no blastic or lytic bone lesions. Soft tissues and spinal canal: Prevertebral soft tissues and predental space regions are normal. There are no paraspinous lesions. There is no evident cord or canal hematoma. Disc levels: There is moderately severe disc space narrowing at C6-5 6, C6-7, and C7-T1. There are anterior osteophytes at C4, C5, C6, and C7. There is multilevel facet hypertrophy and osteoarthritic change. There is exit foraminal narrowing due to bony hypertrophy on the right at C3-4 with effacement of the exiting nerve root in this area. There is exit foraminal narrowing, moderate, at C5-6 bilaterally due to bony hypertrophy. There is exit foraminal narrowing at C6-7 bilaterally, more severe on the left than on the right,  with effacement of exiting nerve roots at this level bilaterally. There is no frank disc extrusion or high-grade stenosis. Upper chest: Limited assessment without abnormality. Other: There is atherosclerotic calcification in each carotid artery. IMPRESSION: CT head: Mild atrophy with mild periventricular small vessel disease. No acute infarct. No mass or hemorrhage. There are foci of arterial vascular calcification. There are areas of paranasal sinus disease. CT cervical spine: No fracture or spondylolisthesis. Multilevel arthropathy with areas of nerve root effacement due to bony hypertrophy. No frank disc extrusion or stenosis. There is bilateral carotid artery calcification. Electronically Signed   By: Lowella Grip III M.D.   On: 04/15/2018 11:54   Dg Hip Unilat W Or Wo Pelvis 2-3 Views Right  Result Date: 04/15/2018 CLINICAL DATA:  Post fall, now with right hip pain. EXAM: DG HIP (WITH OR WITHOUT PELVIS) 2-3V RIGHT COMPARISON:  None. FINDINGS: No fracture or dislocation. Right hip joint spaces appear preserved. No evidence avascular necrosis. Limited visualization of the pelvis and contralateral left hip is normal. Regional soft tissues appear normal. IMPRESSION: No explanation for patient's right hip pain. Electronically Signed   By: Sandi Mariscal M.D.   On: 04/15/2018 11:29    Procedures Procedures (including critical care time)  Medications Ordered in ED Medications - No data to display   Initial Impression / Assessment and Plan / ED Course  I have reviewed the triage vital signs and the nursing notes.  Pertinent labs & imaging results that were available during my care of the patient were reviewed by me and considered in my medical decision making (see chart for details).  72 year old male presents with a fall earlier this morning.  He is unable to clearly tell me whether it was near syncopal versus mechanical.  His vital signs are normal.  CT of the head and C-spine are negative.  X-ray  of the lumbar spine, right shoulder, right hip are negative.  Blood work was obtained which is normal.  Results were discussed with the patient and family member at bedside.  They are comfortable with discharge.  Final Clinical Impressions(s) / ED Diagnoses   Final diagnoses:  Fall, initial encounter  Neck pain  Acute bilateral low back pain without sciatica    ED Discharge Orders    None       Recardo Evangelist, PA-C 04/15/18 1508    Valarie Merino, MD 04/15/18 [phone]

## 2018-04-24 ENCOUNTER — Telehealth: Payer: Self-pay | Admitting: *Deleted

## 2018-04-24 NOTE — Telephone Encounter (Signed)
Received fax from [facility] regarding patient's prosthetic.  Left #6 Sock [id number] Left #2 Socket [id number] Left #2 Shrinker [id number] Left #6 Sock [phone]  Placed Fax in Dr. Vale Haven folder to review and sign.   [facility] [phone] Fax: [phone]

## 2018-05-09 ENCOUNTER — Ambulatory Visit: Payer: Medicare HMO | Admitting: Podiatry

## 2018-05-09 DIAGNOSIS — M79676 Pain in unspecified toe(s): Secondary | ICD-10-CM | POA: Diagnosis not present

## 2018-05-09 DIAGNOSIS — E0843 Diabetes mellitus due to underlying condition with diabetic autonomic (poly)neuropathy: Secondary | ICD-10-CM

## 2018-05-09 DIAGNOSIS — B351 Tinea unguium: Secondary | ICD-10-CM | POA: Diagnosis not present

## 2018-05-15 DIAGNOSIS — Z89512 Acquired absence of left leg below knee: Secondary | ICD-10-CM | POA: Diagnosis not present

## 2018-05-15 NOTE — Progress Notes (Signed)
   SUBJECTIVE 72 year old male with PMHx of type II diabetes mellitus and left BKA presents to office today complaining of elongated, thickened nails that cause pain while ambulating in shoes. He is unable to trim his own nails. Patient is here for further evaluation and treatment.  Past Medical History:  Diagnosis Date  . Chest pain   . Diabetes mellitus   . DM2 (diabetes mellitus, type 2) ([place])   . GERD (gastroesophageal reflux disease)   . HLD (hyperlipidemia)   . HTN (hypertension)   . PAF (paroxysmal atrial fibrillation) ([place])   . Palpitations     OBJECTIVE General Patient is awake, alert, and oriented x 3 and in no acute distress. Derm Skin is dry and supple of RLE. Negative open lesions or macerations. Remaining integument unremarkable. Nails are tender, long, thickened and dystrophic with subungual debris, consistent with onychomycosis, 1-5 right. No signs of infection noted. Vasc  DP and PT pedal pulses palpable of RLE. Temperature gradient within normal limits.  Neuro Epicritic and protective threshold sensation diminished of RLE.  Musculoskeletal Exam No symptomatic pedal deformities noted of RLE. Muscular strength within normal limits.  ASSESSMENT 1. Diabetes Mellitus w/ peripheral neuropathy 2. Onychomycosis of nail due to dermatophyte right 3. Pain in right foot  4. H/o BKA left   PLAN OF CARE 1. Patient evaluated today. 2. Instructed to maintain good pedal hygiene and foot care. Stressed importance of controlling blood sugar.  3. Mechanical debridement of nails 1-5 of the right foot performed using a nail nipper. Filed with dremel without incident.  4. Return to clinic in 3 mos.     Edrick Kins, DPM [facility]  Dr. Edrick Kins, [address]                                        [place], [place] [address]                Office [phone]  Fax [phone]

## 2018-05-17 ENCOUNTER — Encounter: Payer: Self-pay | Admitting: Family

## 2018-05-17 ENCOUNTER — Ambulatory Visit (INDEPENDENT_AMBULATORY_CARE_PROVIDER_SITE_OTHER): Payer: Medicare HMO | Admitting: Family

## 2018-05-17 VITALS — BP 110/70 | HR 67 | Temp 97.3°F | Resp 18 | Ht 70.0 in | Wt 255.6 lb

## 2018-05-17 DIAGNOSIS — R05 Cough: Secondary | ICD-10-CM | POA: Diagnosis not present

## 2018-05-17 DIAGNOSIS — R058 Other specified cough: Secondary | ICD-10-CM

## 2018-05-17 DIAGNOSIS — K219 Gastro-esophageal reflux disease without esophagitis: Secondary | ICD-10-CM | POA: Diagnosis not present

## 2018-05-17 MED ORDER — OMEPRAZOLE 20 MG PO CPDR: 20.0000 mg | DELAYED_RELEASE_CAPSULE | Freq: Every day | ORAL | 3 refills | Status: DC

## 2018-05-17 NOTE — Progress Notes (Signed)
Provider: Dinah Ngetich FNP-C  Gildardo Cranker, DO  Patient Care Team: Gildardo Cranker, DO as PCP - General (Internal Medicine) Clent Jacks, MD as Consulting Physician (Ophthalmology)  Extended Emergency Contact Information Primary Emergency Contact: Wenner,Anna Address: [address]          [place], [place] [address] Johnnette Litter of [place] Phone: [phone] Relation: Spouse Secondary Emergency Contact: Thomasenia Bottoms [place] of [place] Phone: [phone] Relation: Sister  Goals of care: Advanced Directive information Advanced Directives 01/24/2018  Does Patient Have a Medical Advance Directive? Yes  Type of Advance Directive [place]  Does patient want to make changes to medical advance directive? -  Copy of [place] in Chart? Yes  Would patient like information on creating a medical advance directive? -     Chief Complaint  Patient presents with  . Acute Visit    coughing spells x6 months; non productive; has not tried OTC meds    HPI:  Pt is a 72 y.o. male seen today at [facility] office for an acute visit for evaluation of cough for the past 6 months.He is accompanied today with ex-wife.He states has had dry cough for about 6 months.Ex-wife states cough last for about 5 minutes.He also states chest usually has a burning sensation.He has not taken anything for the cough though coughing is relieved by drinking cold water.coughing episodes are the same at night and during the day but worst after eating spicy food.He denies any fever,chills,wheezing or shortness of breath.    Past Medical History:  Diagnosis Date  . Chest pain   . Diabetes mellitus   . DM2 (diabetes mellitus, type 2) ([place])   . GERD (gastroesophageal reflux disease)   . HLD (hyperlipidemia)   . HTN (hypertension)   . PAF (paroxysmal atrial fibrillation) ([place])   . Palpitations    Past Surgical History:  Procedure Laterality Date  . APPENDECTOMY     . CHOLECYSTECTOMY    . COLONOSCOPY     Dr. Michail Sermon  . HERNIA REPAIR    . LEG AMPUTATION Left [id number]   Dr. Kendell Bane  . SHOULDER SURGERY  [phone]   Dr. Meridee Score  . TOTAL ANKLE ARTHROPLASTY  [id number]    No Known Allergies  Outpatient Encounter Medications as of 05/17/2018  Medication Sig  . amitriptyline (ELAVIL) 50 MG tablet Take 1 tablet (50 mg total) by mouth at bedtime.  Marland Kitchen apixaban (ELIQUIS) 5 MG TABS tablet Take 1 tablet (5 mg total) by mouth 2 (two) times daily.  Marland Kitchen atorvastatin (LIPITOR) 40 MG tablet TAKE 1 TABLET DAILY  . b complex vitamins tablet Take 1 tablet by mouth daily.  Marland Kitchen gabapentin (NEURONTIN) 600 MG tablet Take 1 tablet (600 mg total) by mouth 3 (three) times daily.  Marland Kitchen gentamicin cream (GARAMYCIN) 0.1 % Apply 1 application topically 3 (three) times daily.  . Menthol (ICY HOT) 5 % PTCH Apply 1 patch topically as needed (back pain).  . metFORMIN (GLUCOPHAGE-XR) 500 MG 24 hr tablet TAKE 2 TABLETS DAILY WITH  SUPPER  . metoprolol succinate (TOPROL-XL) 50 MG 24 hr tablet Take 1 tablet (50 mg total) by mouth daily with supper. Take with or immediately following a meal.  . Polyethyl Glycol-Propyl Glycol (SYSTANE OP) Place 1 drop into both eyes 2 (two) times daily as needed (dry eyes/ burning).  . topiramate (TOPAMAX) 50 MG tablet Take 1.5 tablets (75 mg total) by mouth daily.  . vitamin C (ASCORBIC ACID) 500 MG  tablet Take 500 mg by mouth 2 (two) times daily.    No facility-administered encounter medications on file as of 05/17/2018.     Review of Systems  Constitutional: Negative for appetite change, chills, fatigue and fever.  HENT: Positive for hearing loss. Negative for congestion, postnasal drip, rhinorrhea, sinus pressure, sinus pain, sneezing, sore throat and trouble swallowing.   Respiratory: Negative for chest tightness, shortness of breath and wheezing.        Dry cough   Cardiovascular: Positive for leg swelling. Negative for chest pain and  palpitations.  Gastrointestinal: Negative for abdominal distention, abdominal pain, constipation, diarrhea, nausea and vomiting.  Musculoskeletal: Positive for gait problem.       Chronic back pain   Skin: Negative for color change, pallor and rash.  Neurological: Negative for dizziness, light-headedness and headaches.  Psychiatric/Behavioral: Negative for agitation and sleep disturbance. The patient is not nervous/anxious.     Immunization History  Administered Date(s) Administered  . DTaP 10/10/2014  . Influenza,inj,Quad PF,6+ Mos 08/05/2016  . Influenza-Unspecified 10/10/2014  . Pneumococcal Conjugate-13 11/11/2016  . Pneumococcal-Unspecified 10/10/2014   Pertinent  Health Maintenance Due  Topic Date Due  . URINE MICROALBUMIN  04/19/2018  . HEMOGLOBIN A1C  04/30/2018  . INFLUENZA VACCINE  05/10/2018  . OPHTHALMOLOGY EXAM  10/18/2018  . FOOT EXAM  10/31/2018  . COLONOSCOPY  08/19/2024  . PNA vac Low Risk Adult  Completed   Fall Risk  01/24/2018 01/01/2018 06/30/2017 04/07/2017 12/26/2016  Falls in the past year? Yes Yes No No Yes  Number falls in past yr: 1 1 - - 1  Injury with Fall? No No - - No  Comment - - - - -  Risk Factor Category  - - - - -  Risk for fall due to : - Impaired balance/gait;Impaired mobility - - Impaired balance/gait  Follow up - Falls evaluation completed;Education provided;Falls prevention discussed - - Falls evaluation completed;Education provided;Falls prevention discussed    Vitals:   05/17/18 0933  BP: 110/70  Pulse: 67  Resp: 18  Temp: (!) 97.3 F (36.3 C)  TempSrc: Oral  SpO2: 98%  Weight: 255 lb 9.6 oz (115.9 kg)  Height: 5\' 10"  (1.778 m)   Body mass index is 36.67 kg/m. Physical Exam  Constitutional: He is oriented to person, place, and time.  Obese elderly in no acute distress  HENT:  Head: Normocephalic.  Right Ear: External ear normal.  Left Ear: External ear normal.  Mouth/Throat: Oropharynx is clear and moist. No oropharyngeal  exudate.  Eyes: Pupils are equal, round, and reactive to light. Conjunctivae and EOM are normal. Right eye exhibits no discharge. Left eye exhibits no discharge. No scleral icterus.  Neck: Normal range of motion. No JVD present. No thyromegaly present.  Cardiovascular: Normal rate, regular rhythm, normal heart sounds and intact distal pulses. Exam reveals no gallop and no friction rub.  No murmur heard. Pulmonary/Chest: Effort normal and breath sounds normal. No respiratory distress. He has no wheezes. He has no rales.  Abdominal: Soft. Bowel sounds are normal. He exhibits no distension and no mass. There is no tenderness. There is no rebound and no guarding.  Musculoskeletal: He exhibits no tenderness.  Unsteady gait ambulates with right hand cane.Left BKA.Right ankle 1+ edema.   Lymphadenopathy:    He has no cervical adenopathy.  Neurological: He is oriented to person, place, and time. Gait abnormal.  Skin: Skin is warm and dry. No rash noted. No erythema. No pallor.  Psychiatric: He has  a normal mood and affect. His speech is normal and behavior is normal. Judgment and thought content normal.  Vitals reviewed.   Labs reviewed: Recent Labs    10/31/17 1019 04/15/18 1300  NA 139 138  K 4.4 4.1  CL 106 109  CO2 29 24  GLUCOSE 117* 105*  BUN 11 8  CREATININE 0.87 0.90  CALCIUM 9.3 8.6*   Recent Labs    10/31/17 1019  AST 15  ALT 17  BILITOT 0.5  PROT 6.6   Recent Labs    10/31/17 1019 04/15/18 1300  WBC 7.1 6.7  NEUTROABS  --  4.6  HGB 13.4 13.2  HCT 40.3 41.8  MCV 87.6 96.1  PLT CANCELED 152   Lab Results  Component Value Date   TSH 1.37 10/31/2017   Lab Results  Component Value Date   HGBA1C 6.1 (H) 10/31/2017   Lab Results  Component Value Date   CHOL 127 10/31/2017   HDL 85 10/31/2017   LDLCALC 27 10/31/2017   TRIG 66 10/31/2017   CHOLHDL 1.5 10/31/2017    Significant Diagnostic Results in last 30 days:  No results found.  Assessment/Plan 1.  Dry cough Afebrile.Relieved with cold water.Bilateral lungs clear to auscultation.suspect cough due to acid reflex.will start on omeprazole.continue to monitor.Notify provider if worsening or no relief.   2. Gastroesophageal reflux disease without esophagitis Aggravated by spicy foods.Start on Omeprazole 20 mg capsule one by mouth daily.Avoid aggravating spicy and acidic foods.continue to monitor.   Family/ staff Communication: Reviewed plan of care with patient and Ex-wife Labs/tests ordered: None   Dinah C Ngetich, NP

## 2018-05-17 NOTE — Patient Instructions (Signed)

## 2018-05-30 ENCOUNTER — Encounter: Payer: Self-pay | Admitting: Internal Medicine

## 2018-06-25 ENCOUNTER — Telehealth: Payer: Self-pay | Admitting: *Deleted

## 2018-06-25 DIAGNOSIS — M545 Low back pain: Principal | ICD-10-CM

## 2018-06-25 DIAGNOSIS — G8929 Other chronic pain: Secondary | ICD-10-CM

## 2018-06-25 NOTE — Telephone Encounter (Signed)
Patient wife, [name] called and wanted to know if you would place a referral to [facility] Rhe[facility]ology [phone] for patient to be evaluated for Back Arthritis. Please Advise.

## 2018-06-27 NOTE — Telephone Encounter (Signed)
Ok to request 

## 2018-07-16 ENCOUNTER — Telehealth: Payer: Self-pay | Admitting: *Deleted

## 2018-07-16 DIAGNOSIS — M545 Low back pain, unspecified: Secondary | ICD-10-CM

## 2018-07-16 NOTE — Telephone Encounter (Signed)
Patient wife, [name] called and stated that patient needs a referral to [facility] [phone] for evaluation of back pain.   I explained to patient that this referral was already placed on 06/25/2018 and they have been trying to contact the patient to schedule an appointment several times. Patient wife stated that she did not recognize the number and didn't answer. Would like another referral and stated that she would answer this time.   Referral placed.

## 2018-07-24 NOTE — Progress Notes (Signed)
Office Visit Note  Patient: Darrell Horne             Date of Birth: 04-Feb-1946           MRN: [id number]             PCP: Gildardo Cranker, DO Referring: Gildardo Cranker, DO Visit Date: 08/07/2018 Occupation: @GUAROCC @  Subjective:  Lower back pain   History of Present Illness: Darrell Horne is a 72 y.o. male seen in consultation per request of his PCP.  Patient is accompanied by his wife today.  According to him he has had lower back pain all his life.  The pain has become more frequent and persistent.  He states he has pain on standing or walking.  He denies any nocturnal pain.  The pain radiates to his both hips.  He also has some discomfort in his right shoulder joint.  He denies pain in any other joints.  Activities of Daily Living:  Patient reports morning stiffness for 1 hour.   Patient Denies nocturnal pain.  Difficulty dressing/grooming: Reports Difficulty climbing stairs: Reports Difficulty getting out of chair: Reports Difficulty using hands for taps, buttons, cutlery, and/or writing: Reports  Review of Systems  Constitutional: Positive for fatigue. Negative for night sweats.  HENT: Positive for mouth dryness. Negative for mouth sores, ear ringing and nose dryness.   Eyes: Negative for redness and dryness.  Respiratory: Negative for cough, shortness of breath, apnea and difficulty breathing.   Cardiovascular: Negative for chest pain, palpitations, hypertension, irregular heartbeat and swelling in legs/feet.  Gastrointestinal: Negative for blood in stool, constipation and diarrhea.  Endocrine: Negative for increased urination.  Genitourinary: Positive for urgency.  Musculoskeletal: Positive for arthralgias, joint pain, myalgias, morning stiffness and myalgias. Negative for joint swelling, muscle weakness and muscle tenderness.  Skin: Negative for color change, rash, hair loss, nodules/bumps, skin tightness, ulcers and sensitivity to sunlight.  Allergic/Immunologic:  Negative for susceptible to infections.  Neurological: Positive for dizziness, memory loss and weakness. Negative for fainting, headaches and night sweats.  Hematological: Negative for swollen glands.  Psychiatric/Behavioral: Positive for depressed mood and sleep disturbance. The patient is not nervous/anxious.     PMFS History:  Patient Active Problem List   Diagnosis Date Noted  . Hx of BKA, left ([place]) 11/11/2016  . High risk medication use 11/11/2016  . Syncope 04/17/2016  . Diabetic polyneuropathy associated with type 2 diabetes mellitus ([place]) 12/23/2015  . Alcoholic peripheral neuropathy ([place]) 12/23/2015  . Sensory ataxia 12/23/2015  . Headache 11/04/2015  . Chronic low back pain 11/04/2015  . Insomnia 11/04/2015  . Neuropathy due to medical condition ([place]) 04/08/2015  . Diabetes ([place]) 04/09/2014  . PAF (paroxysmal atrial fibrillation) ([place])   . DM2 (diabetes mellitus, type 2) ([place])   . HTN (hypertension)   . Palpitations   . HLD (hyperlipidemia)     Past Medical History:  Diagnosis Date  . Chest pain   . Diabetes mellitus   . DM2 (diabetes mellitus, type 2) ([place])   . GERD (gastroesophageal reflux disease)   . HLD (hyperlipidemia)   . HTN (hypertension)   . PAF (paroxysmal atrial fibrillation) ([place])   . Palpitations     Family History  Problem Relation Age of Onset  . Heart disease Mother        Living  . Stroke Father 38       Deceased  . Diabetes Sister   . Cancer Sister    Past Surgical History:  Procedure Laterality Date  . APPENDECTOMY    . CHOLECYSTECTOMY    . COLONOSCOPY     Dr. Michail Sermon  . HERNIA REPAIR    . LEG AMPUTATION Left [phone]   Dr. Kendell Bane  . SHOULDER SURGERY  [phone]   Dr. Meridee Score  . TOTAL ANKLE ARTHROPLASTY  [id number]   Social History   Social History Narrative   Diet:   Do you drink/eat things with caffeine?  Yes   Marital status:  Divorced.  What year were you married?  2001   Do you live in a house, assisted  living, condo, apartment, trailer, etc.?  Apartment   Is it one or more stories?  1 story   How many persons live in your home?  1   Do you have any pets in your home?  No   Current or past profession:  Truck driver   Do you exercise?  Some  Type and how often:  Walking    Objective: Vital Signs: BP 116/69 (BP Location: Left Arm, Patient Position: Sitting, Cuff Size: Normal)   Pulse 66   Ht 5\' 10"  (1.778 m)   Wt 261 lb 3.2 oz (118.5 kg)   BMI 37.48 kg/m    Physical Exam  Constitutional: He is oriented to person, place, and time. He appears well-developed and well-nourished.  HENT:  Head: Normocephalic and atraumatic.  Eyes: Pupils are equal, round, and reactive to light. Conjunctivae and EOM are normal.  Neck: Normal range of motion. Neck supple.  Cardiovascular: Normal rate, regular rhythm and normal heart sounds.  Pulmonary/Chest: Effort normal and breath sounds normal.  Abdominal: Soft. Bowel sounds are normal.  Musculoskeletal:  Left BKA  Neurological: He is alert and oriented to person, place, and time.  Skin: Skin is warm and dry. Capillary refill takes less than 2 seconds.  Psychiatric: He has a normal mood and affect. His behavior is normal.  Nursing note and vitals reviewed.    Musculoskeletal Exam: Spine limited painful range of motion.  He has thoracic kyphosis.  Limited painful range of motion of lumbar spine with tenderness over lower lumbar region.  He had painful range of motion of right shoulder joint.  Elbow joints wrist joint MCPs PIPs were in good range of motion with no synovitis.  Hip joints were in good range of motion.  He has left BKA.  CDAI Exam: CDAI Score: Not documented Patient Global Assessment: Not documented; Provider Global Assessment: Not documented Swollen: Not documented; Tender: Not documented Joint Exam   Not documented   There is currently no information documented on the homunculus. Go to the Rheumatology activity and complete the  homunculus joint exam.  Investigation: No additional findings.  Imaging: No results found.  Recent Labs: Lab Results  Component Value Date   WBC 6.7 04/15/2018   HGB 13.2 04/15/2018   PLT 152 04/15/2018   NA 138 04/15/2018   K 4.1 04/15/2018   CL 109 04/15/2018   CO2 24 04/15/2018   GLUCOSE 105 (H) 04/15/2018   BUN 8 04/15/2018   CREATININE 0.90 04/15/2018   BILITOT 0.5 10/31/2017   ALKPHOS 148 (H) 11/11/2016   AST 15 10/31/2017   ALT 17 10/31/2017   PROT 6.6 10/31/2017   ALBUMIN 3.6 11/11/2016   CALCIUM 8.6 (L) 04/15/2018   GFRAA >60 04/15/2018    Speciality Comments: No specialty comments available.  Procedures:  No procedures performed Allergies: Patient has no known allergies.   Assessment / Plan:  Visit Diagnoses: Chronic low back pain without sciatica, unspecified back pain laterality -patient reports having lower back pain for multiple years.  The pain has become more intense or more frequent now.  DDD (degenerative disc disease), lumbar - MRI 2016 revealed multilevel spondylosis and foraminal stenosis.  I reviewed x-rays and MRI of the lumbar spine with patient.  The findings were discussed at length.  He will benefit from weight loss and exercise.  I will refer him to physical therapy.  DDD (degenerative disc disease), cervical-C-spine x-rays and MRIs were reviewed and discussed.  He has some discomfort in her C-spine as well.  I will refer him to physical therapy .  Chronic right shoulder pain - X-ray shoulder unremarkable April 15, 2018.  He continues to have shoulder discomfort.  He may benefit from some exercises.  Patient has seen Dr. Sharol Given in the past.  Hx of BKA, left (HCC)-due to diabetes.  PAF (paroxysmal atrial fibrillation) ([place])  Essential hypertension-his blood pressure is controlled today.  Diabetic polyneuropathy associated with type 2 diabetes mellitus (HCC)-I would avoid cortisone injection to the spine due to him being  diabetic.  Alcoholic peripheral neuropathy (HCC)  Sensory ataxia-patient is unstable on his feet.  He uses cane.  Other insomnia-he denies nocturnal pain.  History of hyperlipidemia   Orders: No orders of the defined types were placed in this encounter.  No orders of the defined types were placed in this encounter.   Face-to-face time spent with patient was 50 minutes. Greater than 50% of time was spent in counseling and coordination of care.  Follow-Up Instructions: Return if symptoms worsen or fail to improve, for DDD.   Bo Merino, MD  Note - This record has been created using Editor, commissioning.  Chart creation errors have been sought, but may not always  have been located. Such creation errors do not reflect on  the standard of medical care.

## 2018-08-01 ENCOUNTER — Other Ambulatory Visit: Payer: Self-pay | Admitting: *Deleted

## 2018-08-01 MED ORDER — ATORVASTATIN CALCIUM 40 MG PO TABS: ORAL_TABLET | ORAL | 1 refills | Status: DC

## 2018-08-01 MED ORDER — METFORMIN HCL ER 500 MG PO TB24: ORAL_TABLET | ORAL | 1 refills | Status: DC

## 2018-08-01 NOTE — Telephone Encounter (Signed)
Darrell Horne, wife called and stated that patient no longer uses mail order pharmacy. Requesting Metformin and Atorvastatin to be faxed to [facility]. Faxed.

## 2018-08-03 ENCOUNTER — Other Ambulatory Visit: Payer: Self-pay | Admitting: *Deleted

## 2018-08-03 DIAGNOSIS — I480 Paroxysmal atrial fibrillation: Secondary | ICD-10-CM

## 2018-08-03 MED ORDER — APIXABAN 5 MG PO TABS: 5.0000 mg | ORAL_TABLET | Freq: Two times a day (BID) | ORAL | 1 refills | Status: DC

## 2018-08-03 NOTE — Telephone Encounter (Signed)
Patient wife, [name] called and stated that patient needs refill on medication. Faxed to pharmacy.

## 2018-08-07 ENCOUNTER — Ambulatory Visit: Payer: Medicare HMO | Admitting: Podiatry

## 2018-08-07 ENCOUNTER — Ambulatory Visit: Payer: Medicare HMO | Admitting: Rheumatology

## 2018-08-07 ENCOUNTER — Encounter: Payer: Self-pay | Admitting: Rheumatology

## 2018-08-07 ENCOUNTER — Other Ambulatory Visit: Payer: Self-pay | Admitting: *Deleted

## 2018-08-07 VITALS — BP 116/69 | HR 66 | Ht 70.0 in | Wt 261.2 lb

## 2018-08-07 DIAGNOSIS — Z8639 Personal history of other endocrine, nutritional and metabolic disease: Secondary | ICD-10-CM

## 2018-08-07 DIAGNOSIS — G4709 Other insomnia: Secondary | ICD-10-CM

## 2018-08-07 DIAGNOSIS — M51369 Other intervertebral disc degeneration, lumbar region without mention of lumbar back pain or lower extremity pain: Secondary | ICD-10-CM

## 2018-08-07 DIAGNOSIS — I10 Essential (primary) hypertension: Secondary | ICD-10-CM

## 2018-08-07 DIAGNOSIS — I480 Paroxysmal atrial fibrillation: Secondary | ICD-10-CM

## 2018-08-07 DIAGNOSIS — R278 Other lack of coordination: Secondary | ICD-10-CM

## 2018-08-07 DIAGNOSIS — M545 Low back pain, unspecified: Secondary | ICD-10-CM

## 2018-08-07 DIAGNOSIS — M5136 Other intervertebral disc degeneration, lumbar region: Secondary | ICD-10-CM

## 2018-08-07 DIAGNOSIS — E1142 Type 2 diabetes mellitus with diabetic polyneuropathy: Secondary | ICD-10-CM | POA: Diagnosis not present

## 2018-08-07 DIAGNOSIS — R69 Illness, unspecified: Secondary | ICD-10-CM | POA: Diagnosis not present

## 2018-08-07 DIAGNOSIS — M25511 Pain in right shoulder: Secondary | ICD-10-CM | POA: Diagnosis not present

## 2018-08-07 DIAGNOSIS — G621 Alcoholic polyneuropathy: Secondary | ICD-10-CM

## 2018-08-07 DIAGNOSIS — M5030 Other cervical disc degeneration, unspecified cervical region: Secondary | ICD-10-CM

## 2018-08-07 DIAGNOSIS — Z89512 Acquired absence of left leg below knee: Secondary | ICD-10-CM | POA: Diagnosis not present

## 2018-08-07 DIAGNOSIS — G8929 Other chronic pain: Secondary | ICD-10-CM

## 2018-08-07 MED ORDER — OMEPRAZOLE 20 MG PO CPDR: 20.0000 mg | DELAYED_RELEASE_CAPSULE | Freq: Every day | ORAL | 0 refills | Status: DC

## 2018-08-07 NOTE — Telephone Encounter (Signed)
Patient requested 

## 2018-08-10 ENCOUNTER — Encounter: Payer: Self-pay | Admitting: Podiatry

## 2018-08-10 ENCOUNTER — Ambulatory Visit: Payer: Medicare HMO | Admitting: Podiatry

## 2018-08-10 DIAGNOSIS — B353 Tinea pedis: Secondary | ICD-10-CM | POA: Diagnosis not present

## 2018-08-10 DIAGNOSIS — E1142 Type 2 diabetes mellitus with diabetic polyneuropathy: Secondary | ICD-10-CM

## 2018-08-10 DIAGNOSIS — M79676 Pain in unspecified toe(s): Secondary | ICD-10-CM

## 2018-08-10 DIAGNOSIS — Z89512 Acquired absence of left leg below knee: Secondary | ICD-10-CM

## 2018-08-10 DIAGNOSIS — B351 Tinea unguium: Secondary | ICD-10-CM | POA: Diagnosis not present

## 2018-08-10 MED ORDER — CASTELLANI PAINT MODIFIED 1.5 % EX LIQD: CUTANEOUS | 0 refills | Status: DC

## 2018-08-10 NOTE — Patient Instructions (Addendum)
1. Apply antibiotic ointment between right 3rd and 4th toes every morning 2. Apply Castellani's paint between right 3rd and 4th toes before  bedtime every evening. Report to Emergency Room if foot becomes swollen, red,  warm or you notice drainage from foot.   Diabetes and Foot Care Diabetes may cause you to have problems because of poor blood supply (circulation) to your feet and legs. This may cause the skin on your feet to become thinner, break easier, and heal more slowly. Your skin may become dry, and the skin may peel and crack. You may also have nerve damage in your legs and feet causing decreased feeling in them. You may not notice minor injuries to your feet that could lead to infections or more serious problems. Taking care of your feet is one of the most important things you can do for yourself. Follow these instructions at home:  Wear shoes at all times, even in the house. Do not go barefoot. Bare feet are easily injured.  Check your feet daily for blisters, cuts, and redness. If you cannot see the bottom of your feet, use a mirror or ask someone for help.  Wash your feet with warm water (do not use hot water) and mild soap. Then pat your feet and the areas between your toes until they are completely dry. Do not soak your feet as this can dry your skin.  Apply a moisturizing lotion or petroleum jelly (that does not contain alcohol and is unscented) to the skin on your feet and to dry, brittle toenails. Do not apply lotion between your toes.  Trim your toenails straight across. Do not dig under them or around the cuticle. File the edges of your nails with an emery board or nail file.  Do not cut corns or calluses or try to remove them with medicine.  Wear clean socks or stockings every day. Make sure they are not too tight. Do not wear knee-high stockings since they may decrease blood flow to your legs.  Wear shoes that fit properly and have enough cushioning. To break in new  shoes, wear them for just a few hours a day. This prevents you from injuring your feet. Always look in your shoes before you put them on to be sure there are no objects inside.  Do not cross your legs. This may decrease the blood flow to your feet.  If you find a minor scrape, cut, or break in the skin on your feet, keep it and the skin around it clean and dry. These areas may be cleansed with mild soap and water. Do not cleanse the area with peroxide, alcohol, or iodine.  When you remove an adhesive bandage, be sure not to damage the skin around it.  If you have a wound, look at it several times a day to make sure it is healing.  Do not use heating pads or hot water bottles. They may burn your skin. If you have lost feeling in your feet or legs, you may not know it is happening until it is too late.  Make sure your health care provider performs a complete foot exam at least annually or more often if you have foot problems. Report any cuts, sores, or bruises to your health care provider immediately. Contact a health care provider if:  You have an injury that is not healing.  You have cuts or breaks in the skin.  You have an ingrown nail.  You notice redness on your legs  or feet.  You feel burning or tingling in your legs or feet.  You have pain or cramps in your legs and feet.  Your legs or feet are numb.  Your feet always feel cold. Get help right away if:  There is increasing redness, swelling, or pain in or around a wound.  There is a red line that goes up your leg.  Pus is coming from a wound.  You develop a fever or as directed by your health care provider.  You notice a bad smell coming from an ulcer or wound. This information is not intended to replace advice given to you by your health care provider. Make sure you discuss any questions you have with your health care provider. Document Released: 09/23/2000 Document Revised: 03/03/2016 Document Reviewed:  03/05/2013 Elsevier Interactive Patient Education  2017 [employer].

## 2018-08-14 ENCOUNTER — Telehealth: Payer: Self-pay | Admitting: Neurology

## 2018-08-14 ENCOUNTER — Other Ambulatory Visit: Payer: Self-pay | Admitting: *Deleted

## 2018-08-14 MED ORDER — TOPIRAMATE 50 MG PO TABS: 75.0000 mg | ORAL_TABLET | Freq: Every day | ORAL | 3 refills | Status: DC

## 2018-08-14 NOTE — Telephone Encounter (Signed)
Patient is cancelling the Mail Order Pharmacy orders because they were such a hassle. [facility] [address] Thanks!

## 2018-08-14 NOTE — Telephone Encounter (Signed)
Rx sent 

## 2018-08-17 DIAGNOSIS — M5136 Other intervertebral disc degeneration, lumbar region: Secondary | ICD-10-CM | POA: Insufficient documentation

## 2018-08-17 DIAGNOSIS — M51369 Other intervertebral disc degeneration, lumbar region without mention of lumbar back pain or lower extremity pain: Secondary | ICD-10-CM | POA: Insufficient documentation

## 2018-08-17 DIAGNOSIS — M5030 Other cervical disc degeneration, unspecified cervical region: Secondary | ICD-10-CM | POA: Insufficient documentation

## 2018-08-17 NOTE — Progress Notes (Deleted)
Office Visit Note  Patient: Darrell Horne             Date of Birth: [birth year]           MRN: [id number]             PCP: Gildardo Cranker, DO Referring: Gildardo Cranker, DO Visit Date: 08/23/2018 Occupation: @GUAROCC @  Subjective:  No chief complaint on file.   History of Present Illness: Darrell Horne is a 72 y.o. male ***   Activities of Daily Living:  Patient reports morning stiffness for *** {minute/hour:19697}.   Patient {ACTIONS;DENIES/REPORTS:21021675::"Denies"} nocturnal pain.  Difficulty dressing/grooming: {ACTIONS;DENIES/REPORTS:21021675::"Denies"} Difficulty climbing stairs: {ACTIONS;DENIES/REPORTS:21021675::"Denies"} Difficulty getting out of chair: {ACTIONS;DENIES/REPORTS:21021675::"Denies"} Difficulty using hands for taps, buttons, cutlery, and/or writing: {ACTIONS;DENIES/REPORTS:21021675::"Denies"}  No Rheumatology ROS completed.   PMFS History:  Patient Active Problem List   Diagnosis Date Noted  . Hx of BKA, left ([place]) 11/11/2016  . High risk medication use 11/11/2016  . Syncope 04/17/2016  . Diabetic polyneuropathy associated with type 2 diabetes mellitus ([place]) 12/23/2015  . Alcoholic peripheral neuropathy ([place]) 12/23/2015  . Sensory ataxia 12/23/2015  . Headache 11/04/2015  . Chronic low back pain 11/04/2015  . Insomnia 11/04/2015  . Neuropathy due to medical condition ([place]) 04/08/2015  . Diabetes ([place]) 04/09/2014  . PAF (paroxysmal atrial fibrillation) ([place])   . DM2 (diabetes mellitus, type 2) ([place])   . HTN (hypertension)   . Palpitations   . HLD (hyperlipidemia)     Past Medical History:  Diagnosis Date  . Chest pain   . Diabetes mellitus   . DM2 (diabetes mellitus, type 2) ([place])   . GERD (gastroesophageal reflux disease)   . HLD (hyperlipidemia)   . HTN (hypertension)   . PAF (paroxysmal atrial fibrillation) ([place])   . Palpitations     Family History  Problem Relation Age of Onset  . Heart disease Mother        Living  . Stroke Father  90       Deceased  . Diabetes Sister   . Cancer Sister    Past Surgical History:  Procedure Laterality Date  . APPENDECTOMY    . CHOLECYSTECTOMY    . COLONOSCOPY     Dr. Michail Sermon  . HERNIA REPAIR    . LEG AMPUTATION Left [id number]   Dr. Kendell Bane  . SHOULDER SURGERY  [phone]   Dr. Meridee Score  . TOTAL ANKLE ARTHROPLASTY  [phone]   Social History   Social History Narrative   Diet:   Do you drink/eat things with caffeine?  Yes   Marital status:  Divorced.  What year were you married?  2001   Do you live in a house, assisted living, condo, apartment, trailer, etc.?  Apartment   Is it one or more stories?  1 story   How many persons live in your home?  1   Do you have any pets in your home?  No   Current or past profession:  Truck driver   Do you exercise?  Some  Type and how often:  Walking    Objective: Vital Signs: There were no vitals taken for this visit.   Physical Exam   Musculoskeletal Exam: ***  CDAI Exam: CDAI Score: Not documented Patient Global Assessment: Not documented; Provider Global Assessment: Not documented Swollen: Not documented; Tender: Not documented Joint Exam   Not documented   There is currently no information documented on the homunculus. Go to the Rheumatology activity and complete the homunculus joint  exam.  Investigation: No additional findings.  Imaging: No results found.  Recent Labs: Lab Results  Component Value Date   WBC 6.7 04/15/2018   HGB 13.2 04/15/2018   PLT 152 04/15/2018   NA 138 04/15/2018   K 4.1 04/15/2018   CL 109 04/15/2018   CO2 24 04/15/2018   GLUCOSE 105 (H) 04/15/2018   BUN 8 04/15/2018   CREATININE 0.90 04/15/2018   BILITOT 0.5 10/31/2017   ALKPHOS 148 (H) 11/11/2016   AST 15 10/31/2017   ALT 17 10/31/2017   PROT 6.6 10/31/2017   ALBUMIN 3.6 11/11/2016   CALCIUM 8.6 (L) 04/15/2018   GFRAA >60 04/15/2018    Speciality Comments: No specialty comments available.  Procedures:  No  procedures performed Allergies: Patient has no known allergies.   Assessment / Plan:     Visit Diagnoses: No diagnosis found.   Orders: No orders of the defined types were placed in this encounter.  No orders of the defined types were placed in this encounter.   Face-to-face time spent with patient was *** minutes. Greater than 50% of time was spent in counseling and coordination of care.  Follow-Up Instructions: No follow-ups on file.   Bo Merino, MD  Note - This record has been created using Editor, commissioning.  Chart creation errors have been sought, but may not always  have been located. Such creation errors do not reflect on  the standard of medical care.

## 2018-08-22 ENCOUNTER — Other Ambulatory Visit: Payer: Self-pay

## 2018-08-22 ENCOUNTER — Encounter: Payer: Self-pay | Admitting: Physical Therapy

## 2018-08-22 ENCOUNTER — Ambulatory Visit: Payer: Medicare HMO | Attending: Rheumatology | Admitting: Physical Therapy

## 2018-08-22 DIAGNOSIS — M545 Low back pain, unspecified: Secondary | ICD-10-CM

## 2018-08-22 DIAGNOSIS — R262 Difficulty in walking, not elsewhere classified: Secondary | ICD-10-CM

## 2018-08-22 DIAGNOSIS — G8929 Other chronic pain: Secondary | ICD-10-CM

## 2018-08-22 NOTE — Therapy (Signed)
[place], [place], [address] Phone: [phone]   Fax:  [phone]  Physical Therapy Evaluation  Patient Details  Name: Darrell Horne MRN: [id number] Date of Birth: 12/31/1945 Referring Provider (PT): Bo Merino, MD   Encounter Date: 08/22/2018  PT End of Session - 08/22/18 0944    Visit Number  1    Number of Visits  17    Date for PT Re-Evaluation  10/19/17    Authorization Type  AETNA MCR    PT Start Time  2778   pt arrived late   PT Stop Time  1020    PT Time Calculation (min)  43 min    Behavior During Therapy  [facility] for tasks assessed/performed       Past Medical History:  Diagnosis Date  . Chest pain   . Diabetes mellitus   . DM2 (diabetes mellitus, type 2) ([place])   . GERD (gastroesophageal reflux disease)   . HLD (hyperlipidemia)   . HTN (hypertension)   . PAF (paroxysmal atrial fibrillation) ([place])   . Palpitations     Past Surgical History:  Procedure Laterality Date  . APPENDECTOMY    . CHOLECYSTECTOMY    . COLONOSCOPY     Dr. Michail Sermon  . HERNIA REPAIR    . LEG AMPUTATION Left [id number]   Dr. Kendell Bane  . SHOULDER SURGERY  [phone]   Dr. Meridee Score  . TOTAL ANKLE ARTHROPLASTY  [phone]    There were no vitals filed for this visit.   Subjective Assessment - 08/22/18 0949    Subjective  In my early 25s Iwas lifting a spare tire from a car and it hurt. It got worse- I would lay on the floor and cry. I drove a truck. I went to a chiropractor which made it worse. I talked to another MD about my back pain but they just gave me some pills that didn't help. this is the first round of PT. Every time I move It shoots across my lower back and it paralyzes me, eventually I can move really slowly. Has a WC that he uses "all the time"  Try to walk around the house with [facility]. Lt BKA with prosthetic- I need a new socket. Sometimes I get dizzy and wobbly.     How long can you walk  comfortably?  5 min, about 100 feet    Patient Stated Goals  stand up longer, decrease pain, walk straight    Currently in Pain?  Yes    Pain Location  Back    Pain Orientation  Lower;Right;Left    Pain Descriptors / Indicators  --   lightning   Aggravating Factors   everything, washing dishes    Pain Relieving Factors  icy hot- for a short time, stand up straight         [facility] PT Assessment - 08/22/18 0001      Assessment   Medical Diagnosis  back & neck pain    Referring Provider (PT)  Bo Merino, MD    Onset Date/Surgical Date  --   chronic   Prior Therapy  after amputation, not for back      Precautions   Precautions  Fall      Restrictions   Weight Bearing Restrictions  No      Balance Screen   Has the patient fallen in the past 6 months  Yes    How many times?  1  Has the patient had a decrease in activity level because of a fear of falling?   Yes    Is the patient reluctant to leave their home because of a fear of falling?   No      Home Film/video editor residence    Additional Comments  no stairs at home      Prior Function   Level of Independence  Independent with household mobility with device      Cognition   Overall Cognitive Status  Within Functional Limits for tasks assessed      Observation/Other Assessments   Focus on Therapeutic Outcomes (FOTO)   69% limited      Posture/Postural Control   Posture Comments  kyphotic with forward head, decreased lumbar curve; leaning to Lt      ROM / Strength   AROM / PROM / Strength  Strength      Strength   Overall Strength Comments  max A from UE to stand from chair      Ambulation/Gait   Assistive device  Straight cane    Gait Comments  slow cadence, antalgic, flexed posture, rounded shoulders, forward head                Objective measurements completed on examination: See above findings.              PT Education - 08/22/18 0948    Education  Details  anatomy of condition, POC, HEP, exercise form/rationale, prosthetic fit/care    Person(s) Educated  Patient;Spouse    Methods  Explanation;Demonstration;Tactile cues;Verbal cues;Handout    Comprehension  Verbalized understanding;Returned demonstration;Verbal cues required;Tactile cues required;Need further instruction          PT Long Term Goals - 08/22/18 1042      PT LONG TERM GOAL #1   Title  Pt will be able to ambulate around his home with [facility] the majority of the time    Baseline  Mostly uses WC    Time  8    Period  Weeks    Status  New    Target Date  10/19/18      PT LONG TERM GOAL #2   Title  Pt will be able to stand to wash dishes    Baseline  unable at eval    Time  8    Period  Weeks    Status  New    Target Date  10/19/18      PT LONG TERM GOAL #3   Title  Pt will report feeling that he can "walk straight" when he is using his cane    Baseline  at eval, reports he feels "off" all the time    Time  8    Period  Weeks    Status  New    Target Date  10/19/18      PT LONG TERM GOAL #4   Title  FOTO to 49% limited    Baseline  68% limited    Time  8    Period  Weeks    Status  New    Target Date  10/19/18             Plan - 08/22/18 1032    Clinical Impression Statement  Pt presents to PT with complaints of low back pain that is chronic and severe. Pt is a Lt BKA and reports very poor fit of socket. This has resulted in poor posture in  weight bearing. Pt also has poor posture at rest. Majority of eval today spent educating on importance of correcting fit of prosthetic and pt agreed.     History and Personal Factors relevant to plan of care:  Lt BKA, chronic pain, limited mobility, fall risk    Clinical Presentation  Evolving    Clinical Presentation due to:  sharp, quick pains, chronic and limiting    Clinical Decision Making  Moderate    Rehab Potential  Good    PT Frequency  2x / week    PT Duration  8 weeks    PT Treatment/Interventions   ADLs/Self Care Home Management;Cryotherapy;Gait training;Moist Heat;Stair training;Functional mobility training;Therapeutic activities;Therapeutic exercise;Balance training;Manual techniques;Prosthetic Training;Patient/family education;Passive range of motion;Dry needling;Taping    PT Next Visit Plan  STM lumbar, nu step, supine stretches/postural training, did he contact hangar/PCP about socket    PT Home Exercise Plan  tennis ball STM    Consulted and Agree with Plan of Care  Patient       Patient will benefit from skilled therapeutic intervention in order to improve the following deficits and impairments:  Abnormal gait, Difficulty walking, Obesity, Increased muscle spasms, Decreased endurance, Decreased activity tolerance, Pain, Improper body mechanics, Decreased balance, Decreased strength, Postural dysfunction  Visit Diagnosis: Chronic bilateral low back pain without sciatica - Plan: PT plan of care cert/re-cert  Difficulty in walking, not elsewhere classified - Plan: PT plan of care cert/re-cert     Problem List Patient Active Problem List   Diagnosis Date Noted  . DDD (degenerative disc disease), lumbar 08/17/2018  . DDD (degenerative disc disease), cervical 08/17/2018  . Hx of BKA, left ([place]) 11/11/2016  . High risk medication use 11/11/2016  . Syncope 04/17/2016  . Diabetic polyneuropathy associated with type 2 diabetes mellitus ([place]) 12/23/2015  . Alcoholic peripheral neuropathy ([place]) 12/23/2015  . Sensory ataxia 12/23/2015  . Headache 11/04/2015  . Chronic low back pain 11/04/2015  . Insomnia 11/04/2015  . Neuropathy due to medical condition ([place]) 04/08/2015  . Diabetes ([place]) 04/09/2014  . PAF (paroxysmal atrial fibrillation) ([place])   . DM2 (diabetes mellitus, type 2) ([place])   . HTN (hypertension)   . Palpitations   . HLD (hyperlipidemia)     Shunta Mclaurin C. Cordarro Spinnato PT, DPT 08/22/18 10:52 AM   [facility] [address] [place], [place], [address] Phone: [phone]   Fax:  [phone]  Name: Darrell Horne MRN: [id number] Date of Birth: 08-15-1946

## 2018-08-23 ENCOUNTER — Ambulatory Visit: Payer: Medicare HMO | Admitting: Rheumatology

## 2018-08-28 ENCOUNTER — Ambulatory Visit: Payer: Medicare HMO

## 2018-08-28 DIAGNOSIS — R262 Difficulty in walking, not elsewhere classified: Secondary | ICD-10-CM

## 2018-08-28 DIAGNOSIS — M545 Low back pain, unspecified: Secondary | ICD-10-CM

## 2018-08-28 DIAGNOSIS — G8929 Other chronic pain: Secondary | ICD-10-CM

## 2018-08-28 NOTE — Therapy (Signed)
Darrell Horne, [place], [address] Phone: [phone]   Fax:  [phone]  Physical Therapy Treatment  Patient Details  Name: Darrell Horne MRN: [id number] Date of Birth: 1946/06/23 Referring Provider (PT): Bo Merino, MD   Encounter Date: 08/28/2018  PT End of Session - 08/28/18 1102    Visit Number  2    Number of Visits  17    Date for PT Re-Evaluation  10/19/17    Authorization Type  AETNA MCR    PT Start Time  1102    PT Stop Time  1152    PT Time Calculation (min)  50 min    Activity Tolerance  Patient limited by pain    Behavior During Therapy  [facility] for tasks assessed/performed       Past Medical History:  Diagnosis Date  . Chest pain   . Diabetes mellitus   . DM2 (diabetes mellitus, type 2) ([place])   . GERD (gastroesophageal reflux disease)   . HLD (hyperlipidemia)   . HTN (hypertension)   . PAF (paroxysmal atrial fibrillation) ([place])   . Palpitations     Past Surgical History:  Procedure Laterality Date  . APPENDECTOMY    . CHOLECYSTECTOMY    . COLONOSCOPY     Dr. Michail Sermon  . HERNIA REPAIR    . LEG AMPUTATION Left [id number]   Dr. Kendell Bane  . SHOULDER SURGERY  [phone]   Dr. Meridee Score  . TOTAL ANKLE ARTHROPLASTY  [phone]    There were no vitals filed for this visit.  Subjective Assessment - 08/28/18 1107    Subjective  Saw MD for check up yesterday. Will see Dr Sharol Given and Prosthetist  next week.   Here For back. Has not done ball.  Shoulder problem limits reaching behind back.  Does not live with wife so has no help for exercise    Patient is accompained by:  Family member   in lobby   Pain Location  Back   severe  7/10 at rest   Pain Orientation  Right;Left;Lower;Posterior    Aggravating Factors   everything    Pain Relieving Factors  icy hot                       [facility] Adult PT Treatment/Exercise - 08/28/18 0001      Exercises   Exercises  Lumbar       Lumbar Exercises: Supine   Glut Set  10 reps    Glut Set Limitations  much verbal and tactile cues    Bent Knee Raise  10 reps    Bent Knee Raise Limitations  RT/LT , tactile and verbal cuing      Modalities   Modalities  Moist Heat      Moist Heat Therapy   Number Minutes Moist Heat  12 Minutes    Moist Heat Location  Lumbar Spine   legs on large bolster     Manual Therapy   Manual Therapy  Manual Traction;Joint mobilization;Soft tissue mobilization;Passive ROM    Joint Mobilization  Gr 2-3 roatoion and PA's lateral L5 to T 10     Soft tissue mobilization  bilateral lower back paraspinals ,QL     Passive ROM  hip flexion and adduction     Manual Traction  RT/Lt long axis pulls Gr 2-3                  PT Long Term  Goals - 08/22/18 1042      PT LONG TERM GOAL #1   Title  Pt will be able to ambulate around his home with [facility] the majority of the time    Baseline  Mostly uses WC    Time  8    Period  Weeks    Status  New    Target Date  10/19/18      PT LONG TERM GOAL #2   Title  Pt will be able to stand to wash dishes    Baseline  unable at eval    Time  8    Period  Weeks    Status  New    Target Date  10/19/18      PT LONG TERM GOAL #3   Title  Pt will report feeling that he can "walk straight" when he is using his cane    Baseline  at eval, reports he feels "off" all the time    Time  8    Period  Weeks    Status  New    Target Date  10/19/18      PT LONG TERM GOAL #4   Title  FOTO to 49% limited    Baseline  68% limited    Time  8    Period  Weeks    Status  New    Target Date  10/19/18            Plan - 08/28/18 1136    Clinical Impression Statement  He was more tolerant with Manual treatment than active movement but need to progress this in future. Apparently he will see MD for new prosthesis order next week.     PT Treatment/Interventions  ADLs/Self Care Home Management;Cryotherapy;Gait training;Moist Heat;Stair  training;Functional mobility training;Therapeutic activities;Therapeutic exercise;Balance training;Manual techniques;Prosthetic Training;Patient/family education;Passive range of motion;Dry needling;Taping    PT Next Visit Plan  STM lumbar, nu step, supine stretches/postural training, did he contact hangar/PCP about socket    PT Home Exercise Plan  tennis ball STM    Consulted and Agree with Plan of Care  Patient       Patient will benefit from skilled therapeutic intervention in order to improve the following deficits and impairments:  Abnormal gait, Difficulty walking, Obesity, Increased muscle spasms, Decreased endurance, Decreased activity tolerance, Pain, Improper body mechanics, Decreased balance, Decreased strength, Postural dysfunction  Visit Diagnosis: Chronic bilateral low back pain without sciatica  Difficulty in walking, not elsewhere classified     Problem List Patient Active Problem List   Diagnosis Date Noted  . DDD (degenerative disc disease), lumbar 08/17/2018  . DDD (degenerative disc disease), cervical 08/17/2018  . Hx of BKA, left ([place]) 11/11/2016  . High risk medication use 11/11/2016  . Syncope 04/17/2016  . Diabetic polyneuropathy associated with type 2 diabetes mellitus ([place]) 12/23/2015  . Alcoholic peripheral neuropathy ([place]) 12/23/2015  . Sensory ataxia 12/23/2015  . Headache 11/04/2015  . Chronic low back pain 11/04/2015  . Insomnia 11/04/2015  . Neuropathy due to medical condition ([place]) 04/08/2015  . Diabetes ([place]) 04/09/2014  . PAF (paroxysmal atrial fibrillation) ([place])   . DM2 (diabetes mellitus, type 2) ([place])   . HTN (hypertension)   . Palpitations   . HLD (hyperlipidemia)     Darrell Horne  PT 08/28/2018, 11:38 AM  [facility] [address] [place], [place], [address] Phone: [phone]   Fax:  [phone]  Name: Darrell Horne MRN: [id number] Date of  Birth: 07/31/1946

## 2018-08-29 ENCOUNTER — Encounter: Payer: Self-pay | Admitting: Podiatry

## 2018-08-29 NOTE — Progress Notes (Signed)
Subjective: Darrell Horne presents today with  cc of painful, discolored, thick toenails which interfere with daily activities and routine tasks.  Pain is aggravated when wearing enclosed shoe gear. Pain is getting progressively worse and relieved with periodic professional debridement.  Darrell Horne has h/o BKA of the left LE.  Past Medical History:  Diagnosis Date  . Chest pain   . Diabetes mellitus   . DM2 (diabetes mellitus, type 2) ([place])   . GERD (gastroesophageal reflux disease)   . HLD (hyperlipidemia)   . HTN (hypertension)   . PAF (paroxysmal atrial fibrillation) ([place])   . Palpitations    Past Surgical History:  Procedure Laterality Date  . APPENDECTOMY    . CHOLECYSTECTOMY    . COLONOSCOPY     Dr. Michail Sermon  . HERNIA REPAIR    . LEG AMPUTATION Left [id number]   Dr. Kendell Bane  . SHOULDER SURGERY  [phone]   Dr. Meridee Score  . TOTAL ANKLE ARTHROPLASTY  [phone]   Medications    amitriptyline (ELAVIL) 50 MG tablet    apixaban (ELIQUIS) 5 MG TABS tablet    atorvastatin (LIPITOR) 40 MG tablet    b complex vitamins tablet    gabapentin (NEURONTIN) 600 MG tablet    gentamicin cream (GARAMYCIN) 0.1 %    Menthol (ICY HOT) 5 % PTCH    metFORMIN (GLUCOPHAGE-XR) 500 MG 24 hr tablet    metoprolol succinate (TOPROL-XL) 50 MG 24 hr tablet    omeprazole (PRILOSEC) 20 MG capsule    Polyethyl Glycol-Propyl Glycol (SYSTANE OP)    vitamin C (ASCORBIC ACID) 500 MG tablet    No Known Allergies  Objective: Vascular Examination: Capillary refill time <3 seconds x 5 digits right foot Dorsalis pedis and posterior tibial pulses present RLE No digital hair x 5 digits RLE Skin temperature warm to warm RLE  Dermatological Examination: Skin dry and supple RLE  Toenails 1-5 right foot discolored, thick, dystrophic with subungual debris and pain with palpation to nailbeds due to thickness of nails.  Cracked/macerated area noted 3rd webspace right foot. No erythema, no edema, no  drainage. No signs of deep space infection.   Musculoskeletal: Muscle strength 5/5 to all LE RLE  Neurological: Sensation diminished with 10 gram monofilament. Vibratory sensation diminished  Assessment: 1. Painful onychomycosis toenails 1-5 right foot 2. Interdigital tinea pedis right 3rd webspace 3. NIDDM with neuropathy 4. S/p BKA left LE  Plan: 1. Toenails 1-5 right foot were debrided in length and girth without iatrogenic bleeding. 2. Castellani's paint and triple antibiotic ointment applied to webspace right foot. A prescription for Castellani's Paint was sent to the patient's pharmacy. Patient is to apply to right 3rd webspace once daily. 3. Patient to continue soft, supportive shoe gear 4. Patient to report any pedal injuries to medical professional immediately. 5. Follow up 2 weeks. Patient/POA to call should there be a concern in the interim.

## 2018-08-30 ENCOUNTER — Ambulatory Visit (INDEPENDENT_AMBULATORY_CARE_PROVIDER_SITE_OTHER): Payer: Medicare HMO | Admitting: Orthopedic Surgery

## 2018-09-03 ENCOUNTER — Ambulatory Visit (INDEPENDENT_AMBULATORY_CARE_PROVIDER_SITE_OTHER): Payer: Medicare HMO | Admitting: Physician Assistant

## 2018-09-03 ENCOUNTER — Encounter (INDEPENDENT_AMBULATORY_CARE_PROVIDER_SITE_OTHER): Payer: Self-pay | Admitting: Orthopedic Surgery

## 2018-09-03 ENCOUNTER — Ambulatory Visit: Payer: Medicare HMO | Admitting: Physical Therapy

## 2018-09-03 VITALS — Ht 70.0 in | Wt 261.2 lb

## 2018-09-03 DIAGNOSIS — R262 Difficulty in walking, not elsewhere classified: Secondary | ICD-10-CM | POA: Diagnosis not present

## 2018-09-03 DIAGNOSIS — Z89512 Acquired absence of left leg below knee: Secondary | ICD-10-CM | POA: Diagnosis not present

## 2018-09-03 DIAGNOSIS — M545 Low back pain, unspecified: Secondary | ICD-10-CM

## 2018-09-03 DIAGNOSIS — E1142 Type 2 diabetes mellitus with diabetic polyneuropathy: Secondary | ICD-10-CM | POA: Diagnosis not present

## 2018-09-03 DIAGNOSIS — G8929 Other chronic pain: Secondary | ICD-10-CM | POA: Diagnosis not present

## 2018-09-03 NOTE — Progress Notes (Signed)
Office Visit Note   Patient: Darrell Horne           Date of Birth: May 29, 1946           MRN: [id number] Visit Date: 09/03/2018              Requested by: Gildardo Cranker, DO [place], [place] [address] PCP: Gildardo Cranker, DO  Chief Complaint  Patient presents with  . Left Leg - Follow-up    BK/Eval; Prosthetic      HPI: The patient is a 72 year old male who remotely underwent a left below-the-knee amputation in 2013.  He has been followed by [facility] and does have loosening of the socket and they have tried to modify the sockets multiple times however his residual limb is sinking to deep within the socket and despite the adjustments he is having difficulty with ambulation.  He is previously been a functional prosthetic user with a K3 prosthesis without difficulty.  He reports no other issues with his left below the knee amputation site.  Assessment & Plan: Visit Diagnoses:  1. Hx of BKA, left ([place])   2. Diabetic polyneuropathy associated with type 2 diabetes mellitus ([place])     Plan: The patient was given a prescription for [facility] to fabricate a new socket for K3 ambulation.  It is expected that the patient will continue to be a community ambulator and walk at varying speeds on a regular basis for activities of living such as shopping and content attending community services.  He needs to change speeds while walking in public places and also needs to be able to walk on unable to surfaces such as grass, gravel, curbs, ramps, stairs and bleachers.  He will follow-up here annually as needed for reevaluation.  Follow-Up Instructions: Return if symptoms worsen or fail to improve.   Ortho Exam  Patient is alert, oriented, no adenopathy, well-dressed, normal affect, normal respiratory effort. Patient ambulates with a single-point cane.  Has left transtibial amputation is well-healed with significant atrophy.  He does sink into the socket of his prosthesis to deeply  and this will require modification.  The foot and ankle are in good condition.  Imaging: No results found. No images are attached to the encounter.  Labs: Lab Results  Component Value Date   HGBA1C 6.1 (H) 10/31/2017   HGBA1C 5.9 (H) 04/07/2017   HGBA1C 6.5 07/26/2016   ESRSEDRATE 21 07/16/2015   CRP 1.2 07/16/2015   REPTSTATUS 04/18/2016 FINAL 04/17/2016   CULT <10,000 COLONIES/mL INSIGNIFICANT GROWTH (A) 04/17/2016     Lab Results  Component Value Date   ALBUMIN 3.6 11/11/2016   ALBUMIN 3.6 11/04/2015   ALBUMIN 2.9 (L) 07/07/2014    Body mass index is 37.48 kg/m.  Orders:  No orders of the defined types were placed in this encounter.  No orders of the defined types were placed in this encounter.    Procedures: No procedures performed  Clinical Data: No additional findings.  ROS:  All other systems negative, except as noted in the HPI. Review of Systems  Objective: Vital Signs: Ht 5\' 10"  (1.778 m)   Wt 261 lb 3.2 oz (118.5 kg)   BMI 37.48 kg/m   Specialty Comments:  No specialty comments available.  PMFS History: Patient Active Problem List   Diagnosis Date Noted  . DDD (degenerative disc disease), lumbar 08/17/2018  . DDD (degenerative disc disease), cervical 08/17/2018  . Hx of BKA, left ([employer]) 11/11/2016  . High risk  medication use 11/11/2016  . Syncope 04/17/2016  . Diabetic polyneuropathy associated with type 2 diabetes mellitus ([place]) 12/23/2015  . Alcoholic peripheral neuropathy ([place]) 12/23/2015  . Sensory ataxia 12/23/2015  . Headache 11/04/2015  . Chronic low back pain 11/04/2015  . Insomnia 11/04/2015  . Neuropathy due to medical condition ([place]) 04/08/2015  . Diabetes ([place]) 04/09/2014  . PAF (paroxysmal atrial fibrillation) ([place])   . DM2 (diabetes mellitus, type 2) ([place])   . HTN (hypertension)   . Palpitations   . HLD (hyperlipidemia)    Past Medical History:  Diagnosis Date  . Chest pain   . Diabetes mellitus   . DM2 (diabetes  mellitus, type 2) ([place])   . GERD (gastroesophageal reflux disease)   . HLD (hyperlipidemia)   . HTN (hypertension)   . PAF (paroxysmal atrial fibrillation) ([place])   . Palpitations     Family History  Problem Relation Age of Onset  . Heart disease Mother        Living  . Stroke Father 61       Deceased  . Diabetes Sister   . Cancer Sister     Past Surgical History:  Procedure Laterality Date  . APPENDECTOMY    . CHOLECYSTECTOMY    . COLONOSCOPY     Dr. Michail Sermon  . HERNIA REPAIR    . LEG AMPUTATION Left [id number]   Dr. Kendell Bane  . SHOULDER SURGERY  [phone]   Dr. Meridee Score  . TOTAL ANKLE ARTHROPLASTY  [phone]   Social History   Occupational History  . Occupation: retired Nurse, mental health  . Smoking status: Former Smoker    Years: 12.00    Last attempt to quit: 10/10/1981    Years since quitting: 36.9  . Smokeless tobacco: Never Used  Substance and Sexual Activity  . Alcohol use: No    Comment: Previously drinking a fifth of liquor every two days x 13 years  . Drug use: No  . Sexual activity: Never

## 2018-09-03 NOTE — Therapy (Signed)
[place], [place], [address] Phone: [phone]   Fax:  [phone]  Physical Therapy Treatment  Patient Details  Name: Darrell Horne MRN: [id number] Date of Birth: 1946/04/05 Referring Provider (PT): Bo Merino, MD   Encounter Date: 09/03/2018  PT End of Session - 09/03/18 1219    Visit Number  3    Number of Visits  17    Date for PT Re-Evaluation  10/19/17    Authorization Type  AETNA MCR    PT Start Time  1150    PT Stop Time  1230   last 10 min on heat   PT Time Calculation (min)  40 min    Activity Tolerance  Patient tolerated treatment well    Behavior During Therapy  [facility] for tasks assessed/performed       Past Medical History:  Diagnosis Date  . Chest pain   . Diabetes mellitus   . DM2 (diabetes mellitus, type 2) ([place])   . GERD (gastroesophageal reflux disease)   . HLD (hyperlipidemia)   . HTN (hypertension)   . PAF (paroxysmal atrial fibrillation) ([place])   . Palpitations     Past Surgical History:  Procedure Laterality Date  . APPENDECTOMY    . CHOLECYSTECTOMY    . COLONOSCOPY     Dr. Michail Sermon  . HERNIA REPAIR    . LEG AMPUTATION Left [id number]   Dr. Kendell Bane  . SHOULDER SURGERY  [phone]   Dr. Meridee Score  . TOTAL ANKLE ARTHROPLASTY  [id number]    There were no vitals filed for this visit.  Subjective Assessment - 09/03/18 1202    Subjective  Pt relays he saw Dr. Sharol Given with prosthetis present and both agree he will get new socket. He relays his back feels about the same.    Currently in Pain?  Yes    Pain Score  5     Pain Location  Back    Pain Descriptors / Indicators  Aching                       [facility] Adult PT Treatment/Exercise - 09/03/18 0001      Exercises   Exercises  Lumbar      Lumbar Exercises: Stretches   Passive Hamstring Stretch  Right;Left;2 reps;30 seconds    Passive Hamstring Stretch Limitations  manual by PT    Single  Knee to Chest Stretch  Right;Left;2 reps;30 seconds    Single Knee to Chest Stretch Limitations  manual by PT    Lower Trunk Rotation  5 reps;10 seconds      Lumbar Exercises: Supine   Glut Set  15 reps    Clam  15 reps   green   Bent Knee Raise  15 reps    Bridge  15 reps    Other Supine Lumbar Exercises  hip add ball squeeze 5 sec X 15      Modalities   Modalities  Moist Heat      Moist Heat Therapy   Number Minutes Moist Heat  10 Minutes    Moist Heat Location  Lumbar Spine      Manual Therapy   Passive ROM  hip flexion and adduction     Manual Traction  RT/Lt long axis pulls Gr 2-3                  PT Long Term Goals - 08/22/18 1042  PT LONG TERM GOAL #1   Title  Pt will be able to ambulate around his home with [facility] the majority of the time    Baseline  Mostly uses WC    Time  8    Period  Weeks    Status  New    Target Date  10/19/18      PT LONG TERM GOAL #2   Title  Pt will be able to stand to wash dishes    Baseline  unable at eval    Time  8    Period  Weeks    Status  New    Target Date  10/19/18      PT LONG TERM GOAL #3   Title  Pt will report feeling that he can "walk straight" when he is using his cane    Baseline  at eval, reports he feels "off" all the time    Time  8    Period  Weeks    Status  New    Target Date  10/19/18      PT LONG TERM GOAL #4   Title  FOTO to 49% limited    Baseline  68% limited    Time  8    Period  Weeks    Status  New    Target Date  10/19/18            Plan - 09/03/18 1220    Clinical Impression Statement  Pt able to to tolerate increased lumbar/core strengthening today in supine. He now has new presription for new prosthetic socket so hopefully this will also help improve pain and ambulation tolerance along with continued PT.    Rehab Potential  Good    PT Frequency  2x / week    PT Duration  8 weeks    PT Treatment/Interventions  ADLs/Self Care Home Management;Cryotherapy;Gait  training;Moist Heat;Stair training;Functional mobility training;Therapeutic activities;Therapeutic exercise;Balance training;Manual techniques;Prosthetic Training;Patient/family education;Passive range of motion;Dry needling;Taping    PT Next Visit Plan  STM lumbar, nu step, supine stretches/postural training    Consulted and Agree with Plan of Care  Patient       Patient will benefit from skilled therapeutic intervention in order to improve the following deficits and impairments:  Abnormal gait, Difficulty walking, Obesity, Increased muscle spasms, Decreased endurance, Decreased activity tolerance, Pain, Improper body mechanics, Decreased balance, Decreased strength, Postural dysfunction  Visit Diagnosis: Chronic bilateral low back pain without sciatica  Difficulty in walking, not elsewhere classified     Problem List Patient Active Problem List   Diagnosis Date Noted  . DDD (degenerative disc disease), lumbar 08/17/2018  . DDD (degenerative disc disease), cervical 08/17/2018  . Hx of BKA, left ([place]) 11/11/2016  . High risk medication use 11/11/2016  . Syncope 04/17/2016  . Diabetic polyneuropathy associated with type 2 diabetes mellitus ([place]) 12/23/2015  . Alcoholic peripheral neuropathy ([place]) 12/23/2015  . Sensory ataxia 12/23/2015  . Headache 11/04/2015  . Chronic low back pain 11/04/2015  . Insomnia 11/04/2015  . Neuropathy due to medical condition ([place]) 04/08/2015  . Diabetes ([place]) 04/09/2014  . PAF (paroxysmal atrial fibrillation) ([place])   . DM2 (diabetes mellitus, type 2) ([place])   . HTN (hypertension)   . Palpitations   . HLD (hyperlipidemia)     Debbe Odea, PT,DPT 09/03/2018, 12:22 PM  [facility] [address] [place], [place], [address] Phone: [phone]   Fax:  [phone]  Name: Darrell Horne MRN:  [id number] Date of Birth: Sep 14, 1946

## 2018-09-10 ENCOUNTER — Ambulatory Visit: Payer: Medicare HMO | Attending: Rheumatology

## 2018-09-10 DIAGNOSIS — G8929 Other chronic pain: Secondary | ICD-10-CM | POA: Diagnosis not present

## 2018-09-10 DIAGNOSIS — M545 Low back pain, unspecified: Secondary | ICD-10-CM

## 2018-09-10 DIAGNOSIS — R262 Difficulty in walking, not elsewhere classified: Secondary | ICD-10-CM

## 2018-09-10 NOTE — Therapy (Signed)
[place], [place], [address] Phone: [phone]   Fax:  [phone]  Physical Therapy Treatment  Patient Details  Name: Darrell Horne MRN: [id number] Date of Birth: [birth year] Referring Provider (PT): Bo Merino, MD   Encounter Date: 09/10/2018  PT End of Session - 09/10/18 1022    Visit Number  4    Number of Visits  17    Date for PT Re-Evaluation  10/19/17    Authorization Type  AETNA MCR    PT Start Time  1022    PT Stop Time  1110    PT Time Calculation (min)  48 min    Activity Tolerance  Patient tolerated treatment well    Behavior During Therapy  [facility] for tasks assessed/performed       Past Medical History:  Diagnosis Date  . Chest pain   . Diabetes mellitus   . DM2 (diabetes mellitus, type 2) ([place])   . GERD (gastroesophageal reflux disease)   . HLD (hyperlipidemia)   . HTN (hypertension)   . PAF (paroxysmal atrial fibrillation) ([place])   . Palpitations     Past Surgical History:  Procedure Laterality Date  . APPENDECTOMY    . CHOLECYSTECTOMY    . COLONOSCOPY     Dr. Michail Sermon  . HERNIA REPAIR    . LEG AMPUTATION Left [id number]   Dr. Kendell Bane  . SHOULDER SURGERY  [phone]   Dr. Meridee Score  . TOTAL ANKLE ARTHROPLASTY  [phone]    There were no vitals filed for this visit.  Subjective Assessment - 09/10/18 1024    Subjective  No improvements with LBP.   Pain worse in AM. To have prosthetic measurements 10/04/18.     Back min pain sitting now    Patient is accompained by:  Family member    Pain Score  10-Worst pain ever   with movement   Pain Location  Shoulder    Pain Orientation  Right    Pain Descriptors / Indicators  Sharp    Aggravating Factors   using arm.     Pain Relieving Factors  icey hot.                        [facility] Adult PT Treatment/Exercise - 09/10/18 0001      Lumbar Exercises: Stretches   Passive Hamstring Stretch  Right;Left;2  reps;30 seconds    Passive Hamstring Stretch Limitations  manual by PT    Single Knee to Chest Stretch  Right;Left;2 reps;30 seconds    Single Knee to Chest Stretch Limitations  manual by PT    Lower Trunk Rotation  5 reps;10 seconds      Lumbar Exercises: Supine   Glut Set  15 reps    Bent Knee Raise  15 reps    Bent Knee Raise Limitations  RT/LT , tactile and verbal cuing    Bridge  15 reps    Other Supine Lumbar Exercises  with ball squeeze      Moist Heat Therapy   Number Minutes Moist Heat  10 Minutes    Moist Heat Location  Lumbar Spine      Manual Therapy   Joint Mobilization  Gr 3 rotation and PA's lateral L5 to T 10     Soft tissue mobilization  bilateral lower back paraspinals ,QL     Manual Traction  RT/Lt long axis pulls Gr -3-4  PT Long Term Goals - 09/10/18 1026      PT LONG TERM GOAL #1   Title  Pt will be able to ambulate around his home with SPC the majority of the time    Baseline  WC 75%  Walking 25%     Status  On-going      PT LONG TERM GOAL #2   Title  Pt will be able to stand to wash dishes    Baseline  Sits to rest 1x to finish    Status  On-going      PT LONG TERM GOAL #3   Title  Pt will report feeling that he can "walk straight" when he is using his cane    Baseline  Feels some better with posture and walk no device for shot distances    Status  On-going      PT LONG TERM GOAL #4   Title  FOTO to 49% limited    Status  Unable to assess            Plan - 09/10/18 1057    Clinical Impression Statement  He reports feeling better post session. He appears improved but reports pain about same but affect better and moves better.  Will need to add to HEP.     PT Treatment/Interventions  ADLs/Self Care Home Management;Cryotherapy;Gait training;Moist Heat;Stair training;Functional mobility training;Therapeutic activities;Therapeutic exercise;Balance training;Manual techniques;Prosthetic Training;Patient/family  education;Passive range of motion;Dry needling;Taping    PT Next Visit Plan  STM lumbar, nu step, supine stretches/postural training, Initiate HEP    PT Home Exercise Plan  tennis ball STM    Consulted and Agree with Plan of Care  Patient       Patient will benefit from skilled therapeutic intervention in order to improve the following deficits and impairments:  Abnormal gait, Difficulty walking, Obesity, Increased muscle spasms, Decreased endurance, Decreased activity tolerance, Pain, Improper body mechanics, Decreased balance, Decreased strength, Postural dysfunction  Visit Diagnosis: Chronic bilateral low back pain without sciatica  Difficulty in walking, not elsewhere classified     Problem List Patient Active Problem List   Diagnosis Date Noted  . DDD (degenerative disc disease), lumbar 08/17/2018  . DDD (degenerative disc disease), cervical 08/17/2018  . Hx of BKA, left ([place]) 11/11/2016  . High risk medication use 11/11/2016  . Syncope 04/17/2016  . Diabetic polyneuropathy associated with type 2 diabetes mellitus ([place]) 12/23/2015  . Alcoholic peripheral neuropathy ([place]) 12/23/2015  . Sensory ataxia 12/23/2015  . Headache 11/04/2015  . Chronic low back pain 11/04/2015  . Insomnia 11/04/2015  . Neuropathy due to medical condition ([place]) 04/08/2015  . Diabetes ([place]) 04/09/2014  . PAF (paroxysmal atrial fibrillation) ([place])   . DM2 (diabetes mellitus, type 2) ([place])   . HTN (hypertension)   . Palpitations   . HLD (hyperlipidemia)     Darrell Horne  PT 09/10/2018, 11:00 AM  [facility] [address] [place], [place], [address] Phone: [phone]   Fax:  [phone]  Name: Darrell Horne MRN: [id number] Date of Birth: 1946/06/28

## 2018-09-11 ENCOUNTER — Other Ambulatory Visit: Payer: Self-pay | Admitting: Gastroenterology

## 2018-09-13 ENCOUNTER — Encounter: Payer: Self-pay | Admitting: Physical Therapy

## 2018-09-13 ENCOUNTER — Ambulatory Visit: Payer: Medicare HMO | Admitting: Physical Therapy

## 2018-09-13 DIAGNOSIS — R262 Difficulty in walking, not elsewhere classified: Secondary | ICD-10-CM

## 2018-09-13 DIAGNOSIS — G8929 Other chronic pain: Secondary | ICD-10-CM

## 2018-09-13 DIAGNOSIS — M545 Low back pain, unspecified: Secondary | ICD-10-CM

## 2018-09-13 NOTE — Therapy (Signed)
[place], [place], [address] Phone: [phone]   Fax:  [phone]  Physical Therapy Treatment  Patient Details  Name: Darrell Horne MRN: [id number] Date of Birth: February 04, 1946 Referring Provider (PT): Bo Merino, MD   Encounter Date: 09/13/2018  PT End of Session - 09/13/18 1103    Visit Number  5    Number of Visits  17    Date for PT Re-Evaluation  10/19/17    Authorization Type  AETNA MCR    PT Start Time  1104    PT Stop Time  1145    PT Time Calculation (min)  41 min    Activity Tolerance  Patient tolerated treatment well    Behavior During Therapy  [facility] for tasks assessed/performed       Past Medical History:  Diagnosis Date  . Chest pain   . Diabetes mellitus   . DM2 (diabetes mellitus, type 2) ([place])   . GERD (gastroesophageal reflux disease)   . HLD (hyperlipidemia)   . HTN (hypertension)   . PAF (paroxysmal atrial fibrillation) ([place])   . Palpitations     Past Surgical History:  Procedure Laterality Date  . APPENDECTOMY    . CHOLECYSTECTOMY    . COLONOSCOPY     Dr. Michail Sermon  . HERNIA REPAIR    . LEG AMPUTATION Left [id number]   Dr. Kendell Bane  . SHOULDER SURGERY  [phone]   Dr. Meridee Score  . TOTAL ANKLE ARTHROPLASTY  [phone]    There were no vitals filed for this visit.  Subjective Assessment - 09/13/18 1104    Subjective  I twisted it when I was pulling myself out of the tub this morning- denied radicular symptoms    Patient Stated Goals  stand up longer, decrease pain, walk straight    Currently in Pain?  Yes    Pain Score  8     Pain Location  Back                       [facility] Adult PT Treatment/Exercise - 09/13/18 0001      Exercises   Exercises  Lumbar      Lumbar Exercises: Stretches   Passive Hamstring Stretch Limitations  seated EOB    Other Lumbar Stretch Exercise  seated physioball roll out      Lumbar Exercises: Aerobic   Nustep   6 min L5 LE only      Lumbar Exercises: Supine   AB Set Limitations  ab set to bridge over bolster   multiple cues required   Other Supine Lumbar Exercises  hooklying ball squeeze      Lumbar Exercises: Sidelying   Clam  Left;20 reps                  PT Long Term Goals - 09/10/18 1026      PT LONG TERM GOAL #1   Title  Pt will be able to ambulate around his home with SPC the majority of the time    Baseline  WC 75%  Walking 25%     Status  On-going      PT LONG TERM GOAL #2   Title  Pt will be able to stand to wash dishes    Baseline  Sits to rest 1x to finish    Status  On-going      PT LONG TERM GOAL #3   Title  Pt will report  feeling that he can "walk straight" when he is using his cane    Baseline  Feels some better with posture and walk no device for shot distances    Status  On-going      PT LONG TERM GOAL #4   Title  FOTO to 49% limited    Status  Unable to assess            Plan - 09/13/18 1150    Clinical Impression Statement  progressed HEP today and encouraged him to continue self STM at home. Has not set up appointment at [facility] at this time. Reported "I actually feel a little better" after treatment.     PT Treatment/Interventions  ADLs/Self Care Home Management;Cryotherapy;Gait training;Moist Heat;Stair training;Functional mobility training;Therapeutic activities;Therapeutic exercise;Balance training;Manual techniques;Prosthetic Training;Patient/family education;Passive range of motion;Dry needling;Taping    PT Next Visit Plan  cont nu step- add UE if tolerated (Rt shoulder pain), strengthen gluts    PT Home Exercise Plan  tennis ball STM, clam, post pelvic tilt, seated HSS    Consulted and Agree with Plan of Care  Patient       Patient will benefit from skilled therapeutic intervention in order to improve the following deficits and impairments:  Abnormal gait, Difficulty walking, Obesity, Increased muscle spasms, Decreased endurance,  Decreased activity tolerance, Pain, Improper body mechanics, Decreased balance, Decreased strength, Postural dysfunction  Visit Diagnosis: Chronic bilateral low back pain without sciatica  Difficulty in walking, not elsewhere classified     Problem List Patient Active Problem List   Diagnosis Date Noted  . DDD (degenerative disc disease), lumbar 08/17/2018  . DDD (degenerative disc disease), cervical 08/17/2018  . Hx of BKA, left ([place]) 11/11/2016  . High risk medication use 11/11/2016  . Syncope 04/17/2016  . Diabetic polyneuropathy associated with type 2 diabetes mellitus ([place]) 12/23/2015  . Alcoholic peripheral neuropathy ([place]) 12/23/2015  . Sensory ataxia 12/23/2015  . Headache 11/04/2015  . Chronic low back pain 11/04/2015  . Insomnia 11/04/2015  . Neuropathy due to medical condition ([place]) 04/08/2015  . Diabetes ([place]) 04/09/2014  . PAF (paroxysmal atrial fibrillation) ([place])   . DM2 (diabetes mellitus, type 2) ([place])   . HTN (hypertension)   . Palpitations   . HLD (hyperlipidemia)     Kymere Fullington C. Ramia Sidney PT, DPT 09/13/18 11:55 AM   [facility] [address] [place], [place], [address] Phone: [phone]   Fax:  [phone]  Name: Darrell Horne MRN: [id number] Date of Birth: 05/08/1946

## 2018-09-18 ENCOUNTER — Ambulatory Visit: Payer: Medicare HMO | Admitting: Physical Therapy

## 2018-09-18 ENCOUNTER — Encounter: Payer: Self-pay | Admitting: Physical Therapy

## 2018-09-18 DIAGNOSIS — M545 Low back pain, unspecified: Secondary | ICD-10-CM

## 2018-09-18 DIAGNOSIS — R262 Difficulty in walking, not elsewhere classified: Secondary | ICD-10-CM | POA: Diagnosis not present

## 2018-09-18 DIAGNOSIS — G8929 Other chronic pain: Secondary | ICD-10-CM

## 2018-09-18 NOTE — Therapy (Signed)
[place], [place], [address] Phone: [phone]   Fax:  [phone]  Physical Therapy Treatment  Patient Details  Name: Darrell Horne MRN: [id number] Date of Birth: 07/05/1946 Referring Provider (PT): Bo Merino, MD   Encounter Date: 09/18/2018  PT End of Session - 09/18/18 0931    Visit Number  6    Number of Visits  17    Date for PT Re-Evaluation  10/19/17    Authorization Type  AETNA MCR    PT Start Time  0930    PT Stop Time  1010    PT Time Calculation (min)  40 min    Activity Tolerance  Patient tolerated treatment well    Behavior During Therapy  [facility] for tasks assessed/performed       Past Medical History:  Diagnosis Date  . Chest pain   . Diabetes mellitus   . DM2 (diabetes mellitus, type 2) ([place])   . GERD (gastroesophageal reflux disease)   . HLD (hyperlipidemia)   . HTN (hypertension)   . PAF (paroxysmal atrial fibrillation) ([place])   . Palpitations     Past Surgical History:  Procedure Laterality Date  . APPENDECTOMY    . CHOLECYSTECTOMY    . COLONOSCOPY     Dr. Michail Sermon  . HERNIA REPAIR    . LEG AMPUTATION Left [id number]   Dr. Kendell Bane  . SHOULDER SURGERY  [phone]   Dr. Meridee Score  . TOTAL ANKLE ARTHROPLASTY  [phone]    There were no vitals filed for this visit.  Subjective Assessment - 09/18/18 0950    Subjective  Back is really bothersome today. Stabbing, ice pick sensation    Patient Stated Goals  stand up longer, decrease pain, walk straight    Currently in Pain?  Yes                       [facility] Adult PT Treatment/Exercise - 09/18/18 0001      Exercises   Exercises  Lumbar      Lumbar Exercises: Stretches   Passive Hamstring Stretch  Right;Left;2 reps;30 seconds    Passive Hamstring Stretch Limitations  seated EOB      Lumbar Exercises: Aerobic   Nustep  L4 UE & LE 5 min      Lumbar Exercises: Standing   Other Standing  Lumbar Exercises  mini squat- back to chair & PT assist, heavy VC      Lumbar Exercises: Seated   Long Arc Quad on Chair  Both;10 reps    LAQ on Chair Limitations  ball bw knees    Other Seated Lumbar Exercises  UE horiz abd red tband- cues for upright posture      Manual Therapy   Soft tissue mobilization  Right glut max around SIJ                  PT Long Term Goals - 09/10/18 1026      PT LONG TERM GOAL #1   Title  Pt will be able to ambulate around his home with SPC the majority of the time    Baseline  WC 75%  Walking 25%     Status  On-going      PT LONG TERM GOAL #2   Title  Pt will be able to stand to wash dishes    Baseline  Sits to rest 1x to finish    Status  On-going  PT LONG TERM GOAL #3   Title  Pt will report feeling that he can "walk straight" when he is using his cane    Baseline  Feels some better with posture and walk no device for shot distances    Status  On-going      PT LONG TERM GOAL #4   Title  FOTO to 49% limited    Status  Unable to assess            Plan - 09/18/18 0950    Clinical Impression Statement  required assistance to stand from chair in lobby with slow cadence into gym, following manual therapy he was able to stand unassisted and increase cadence with reports of significant decrease in pain.     PT Treatment/Interventions  ADLs/Self Care Home Management;Cryotherapy;Gait training;Moist Heat;Stair training;Functional mobility training;Therapeutic activities;Therapeutic exercise;Balance training;Manual techniques;Prosthetic Training;Patient/family education;Passive range of motion;Dry needling;Taping    PT Next Visit Plan  continue nu step with UE (caution Rt shoulder pain), standing glut activation as tolerated    PT Home Exercise Plan  tennis ball STM, clam, post pelvic tilt, seated HSS, seated horiz abd red tband    Consulted and Agree with Plan of Care  Patient       Patient will benefit from skilled therapeutic  intervention in order to improve the following deficits and impairments:  Abnormal gait, Difficulty walking, Obesity, Increased muscle spasms, Decreased endurance, Decreased activity tolerance, Pain, Improper body mechanics, Decreased balance, Decreased strength, Postural dysfunction  Visit Diagnosis: Chronic bilateral low back pain without sciatica  Difficulty in walking, not elsewhere classified     Problem List Patient Active Problem List   Diagnosis Date Noted  . DDD (degenerative disc disease), lumbar 08/17/2018  . DDD (degenerative disc disease), cervical 08/17/2018  . Hx of BKA, left ([place]) 11/11/2016  . High risk medication use 11/11/2016  . Syncope 04/17/2016  . Diabetic polyneuropathy associated with type 2 diabetes mellitus ([place]) 12/23/2015  . Alcoholic peripheral neuropathy ([place]) 12/23/2015  . Sensory ataxia 12/23/2015  . Headache 11/04/2015  . Chronic low back pain 11/04/2015  . Insomnia 11/04/2015  . Neuropathy due to medical condition ([place]) 04/08/2015  . Diabetes ([place]) 04/09/2014  . PAF (paroxysmal atrial fibrillation) ([place])   . DM2 (diabetes mellitus, type 2) ([place])   . HTN (hypertension)   . Palpitations   . HLD (hyperlipidemia)    Darleene Cumpian C. Lynnie Koehler PT, DPT 09/18/18 10:12 AM   [place] [facility] [address] [place], [place], [address] Phone: [phone]   Fax:  [phone]  Name: Darrell Horne MRN: [id number] Date of Birth: [birth year]

## 2018-09-20 ENCOUNTER — Encounter: Payer: Self-pay | Admitting: Physical Therapy

## 2018-09-20 ENCOUNTER — Other Ambulatory Visit: Payer: Self-pay | Admitting: *Deleted

## 2018-09-20 ENCOUNTER — Ambulatory Visit: Payer: Medicare HMO | Admitting: Physical Therapy

## 2018-09-20 DIAGNOSIS — R262 Difficulty in walking, not elsewhere classified: Secondary | ICD-10-CM

## 2018-09-20 DIAGNOSIS — M545 Low back pain, unspecified: Secondary | ICD-10-CM

## 2018-09-20 DIAGNOSIS — G8929 Other chronic pain: Secondary | ICD-10-CM

## 2018-09-20 MED ORDER — METOPROLOL SUCCINATE ER 50 MG PO TB24: 50.0000 mg | ORAL_TABLET | Freq: Every day | ORAL | 1 refills | Status: DC

## 2018-09-20 NOTE — Therapy (Addendum)
[place], [place], [address] Phone: [phone]   Fax:  [phone]  Physical Therapy Treatment/Discharge Patient Details  Name: DAVI KROON MRN: [id number] Date of Birth: 1946-08-23 Referring Provider (PT): Bo Merino, MD   Encounter Date: 09/20/2018  PT End of Session - 09/20/18 0942    Visit Number  7    Number of Visits  17    Date for PT Re-Evaluation  10/19/17    Authorization Type  AETNA MCR    PT Start Time  0930    PT Stop Time  1015    PT Time Calculation (min)  45 min    Activity Tolerance  Patient tolerated treatment well    Behavior During Therapy  [facility] for tasks assessed/performed       Past Medical History:  Diagnosis Date  . Chest pain   . Diabetes mellitus   . DM2 (diabetes mellitus, type 2) ([place])   . GERD (gastroesophageal reflux disease)   . HLD (hyperlipidemia)   . HTN (hypertension)   . PAF (paroxysmal atrial fibrillation) ([place])   . Palpitations     Past Surgical History:  Procedure Laterality Date  . APPENDECTOMY    . CHOLECYSTECTOMY    . COLONOSCOPY     Dr. Michail Sermon  . HERNIA REPAIR    . LEG AMPUTATION Left [id number]   Dr. Kendell Bane  . SHOULDER SURGERY  [phone]   Dr. Meridee Score  . TOTAL ANKLE ARTHROPLASTY  [phone]    There were no vitals filed for this visit.  Subjective Assessment - 09/20/18 0941    Subjective  My leg keeps kicking out. My back feels bad but not as bad as last time. If I could just get the ball where I want it to go it would be better. Right shoulder is bothering me today    Patient Stated Goals  stand up longer, decrease pain, walk straight    Currently in Pain?  Yes    Pain Score  7     Pain Location  Back    Pain Orientation  Lower                       [facility] Adult PT Treatment/Exercise - 09/20/18 0001      Lumbar Exercises: Stretches   Passive Hamstring Stretch Limitations  seated EOB bilat    Quad  Stretch Limitations  prone assisted by PT      Lumbar Exercises: Aerobic   Nustep  L4 LE only6 min   no UE- Rt GHJ pain     Manual Therapy   Manual therapy comments  skilled palpation and monitoring during TPDN    Soft tissue mobilization  Lt glut maxalong superior border       Trigger Point Dry Needling - 09/20/18 1148    Consent Given?  Yes    Education Handout Provided  --   verbal education   Muscles Treated Lower Body  Gluteus maximus    Gluteus Maximus Response  Twitch response elicited;Palpable increased muscle length           PT Education - 09/20/18 1145    Education Details  safety with gait, prosthetic, POC, DN & expected outcomes, assitance and instruction with bed mobility    Person(s) Educated  Patient    Methods  Explanation;Verbal cues;Tactile cues    Comprehension  Verbalized understanding;Verbal cues required;Need further instruction  PT Long Term Goals - 09/10/18 1026      PT LONG TERM GOAL #1   Title  Pt will be able to ambulate around his home with SPC the majority of the time    Baseline  WC 75%  Walking 25%     Status  On-going      PT LONG TERM GOAL #2   Title  Pt will be able to stand to wash dishes    Baseline  Sits to rest 1x to finish    Status  On-going      PT LONG TERM GOAL #3   Title  Pt will report feeling that he can "walk straight" when he is using his cane    Baseline  Feels some better with posture and walk no device for shot distances    Status  On-going      PT LONG TERM GOAL #4   Title  FOTO to 49% limited    Status  Unable to assess            Plan - 09/20/18 1016    Clinical Impression Statement  Pt tolerated DN well and reported he felt much better. At this time we will decrease frequency to 1/week due to financial concerns until he can get a new socket on his prosthetic. Pt is fall risk with poor fitting prosthetic and I asked him to be very careful. Pt agreed.     PT Treatment/Interventions   ADLs/Self Care Home Management;Cryotherapy;Gait training;Moist Heat;Stair training;Functional mobility training;Therapeutic activities;Therapeutic exercise;Balance training;Manual techniques;Prosthetic Training;Patient/family education;Passive range of motion;Dry needling;Taping    PT Next Visit Plan  continue nu step with UE (caution Rt shoulder pain), get in contact with [employer]?    PT Home Exercise Plan  tennis ball STM, clam, post pelvic tilt, seated HSS, seated horiz abd red tband    Consulted and Agree with Plan of Care  Patient       Patient will benefit from skilled therapeutic intervention in order to improve the following deficits and impairments:  Abnormal gait, Difficulty walking, Obesity, Increased muscle spasms, Decreased endurance, Decreased activity tolerance, Pain, Improper body mechanics, Decreased balance, Decreased strength, Postural dysfunction  Visit Diagnosis: Chronic bilateral low back pain without sciatica  Difficulty in walking, not elsewhere classified     Problem List Patient Active Problem List   Diagnosis Date Noted  . DDD (degenerative disc disease), lumbar 08/17/2018  . DDD (degenerative disc disease), cervical 08/17/2018  . Hx of BKA, left ([place]) 11/11/2016  . High risk medication use 11/11/2016  . Syncope 04/17/2016  . Diabetic polyneuropathy associated with type 2 diabetes mellitus ([place]) 12/23/2015  . Alcoholic peripheral neuropathy ([place]) 12/23/2015  . Sensory ataxia 12/23/2015  . Headache 11/04/2015  . Chronic low back pain 11/04/2015  . Insomnia 11/04/2015  . Neuropathy due to medical condition ([place]) 04/08/2015  . Diabetes ([place]) 04/09/2014  . PAF (paroxysmal atrial fibrillation) ([place])   . DM2 (diabetes mellitus, type 2) ([place])   . HTN (hypertension)   . Palpitations   . HLD (hyperlipidemia)    Simuel Stebner C. Arvella Massingale PT, DPT 09/20/18 11:49 AM   [facility] [address] [place], [place],  [address] Phone: [phone]   Fax:  [phone]  Name: SHADRACH BARTUNEK MRN: [id number] Date of Birth: [birth year]  PHYSICAL THERAPY DISCHARGE SUMMARY  Visits from Start of Care: 7  Current functional level related to goals / functional outcomes: See above   Remaining deficits: See above  Education / Equipment: Geophysicist/field seismologist of condition, POC,HEP, exercise form/rationale  Plan: Patient agrees to discharge.  Patient goals were not met. Patient is being discharged due to financial reasons.  ?????   Too expensive.    Shell Blanchette C. Essynce Munsch PT, DPT 11/21/18 12:56 PM

## 2018-09-20 NOTE — Telephone Encounter (Signed)
Refill Request.  

## 2018-09-25 ENCOUNTER — Ambulatory Visit: Payer: Medicare HMO | Admitting: Physical Therapy

## 2018-09-27 ENCOUNTER — Encounter: Payer: Medicare HMO | Admitting: Physical Therapy

## 2018-10-05 ENCOUNTER — Ambulatory Visit: Payer: Medicare HMO | Admitting: Neurology

## 2018-10-11 ENCOUNTER — Telehealth: Payer: Self-pay | Admitting: Physical Therapy

## 2018-10-11 ENCOUNTER — Telehealth: Payer: Self-pay

## 2018-10-11 ENCOUNTER — Ambulatory Visit: Payer: Medicare HMO | Attending: Rheumatology

## 2018-10-11 NOTE — Telephone Encounter (Signed)
Message left for Darrell Horne to call if he wants to return and clinic phone number was  left on message. He was also informed of missed visit today.

## 2018-10-11 NOTE — Telephone Encounter (Signed)
Called patient to try to schedule a follow up appointment to see Dinah. No answer. Left message for him to call office to make an appointment. Patient has not been seen since April 2019.

## 2018-10-15 ENCOUNTER — Telehealth: Payer: Self-pay

## 2018-10-15 NOTE — Telephone Encounter (Signed)
Tried calling patient and spoke with his wife [name]. Explained the need for patient to have follow up appointment. Wife scheduled follow up appointment for patient to see Janett Billow.

## 2018-10-24 ENCOUNTER — Telehealth: Payer: Self-pay | Admitting: *Deleted

## 2018-10-24 ENCOUNTER — Ambulatory Visit: Payer: Medicare HMO | Admitting: Family

## 2018-10-24 NOTE — Telephone Encounter (Signed)
Melissa with [facility] called and left message on clinical intake and stated that she had faxed over a clearance form for patient to HOLD blood thinner 48 hours prior to Colonoscopy on 11/06/2018. Patient has a history of Colon Polyps. Stated she never received back.   Called and LMOM for Melissa to refax the form for provider to review.

## 2018-10-24 NOTE — Telephone Encounter (Signed)
Received form. Placed in Dinah's folder for review.  To be faxed back to [facility] Fax: [phone]

## 2018-10-25 ENCOUNTER — Encounter (HOSPITAL_COMMUNITY): Payer: Self-pay | Admitting: Emergency Medicine

## 2018-10-25 ENCOUNTER — Emergency Department (HOSPITAL_COMMUNITY): Payer: Medicare HMO

## 2018-10-25 ENCOUNTER — Other Ambulatory Visit: Payer: Self-pay

## 2018-10-25 ENCOUNTER — Emergency Department (HOSPITAL_COMMUNITY)
Admission: EM | Admit: 2018-10-25 | Discharge: 2018-10-25 | Disposition: A | Discharge status: Home / Self Care | Payer: Medicare HMO | Attending: Emergency Medicine | Admitting: Emergency Medicine

## 2018-10-25 DIAGNOSIS — Z7984 Long term (current) use of oral hypoglycemic drugs: Secondary | ICD-10-CM | POA: Diagnosis not present

## 2018-10-25 DIAGNOSIS — E1142 Type 2 diabetes mellitus with diabetic polyneuropathy: Secondary | ICD-10-CM | POA: Insufficient documentation

## 2018-10-25 DIAGNOSIS — M545 Low back pain, unspecified: Secondary | ICD-10-CM

## 2018-10-25 DIAGNOSIS — Z96669 Presence of unspecified artificial ankle joint: Secondary | ICD-10-CM | POA: Diagnosis not present

## 2018-10-25 DIAGNOSIS — Z87891 Personal history of nicotine dependence: Secondary | ICD-10-CM | POA: Insufficient documentation

## 2018-10-25 DIAGNOSIS — I10 Essential (primary) hypertension: Secondary | ICD-10-CM | POA: Insufficient documentation

## 2018-10-25 DIAGNOSIS — Z79899 Other long term (current) drug therapy: Secondary | ICD-10-CM | POA: Insufficient documentation

## 2018-10-25 MED ORDER — HYDROCODONE-ACETAMINOPHEN 5-325 MG PO TABS
1.0000 | ORAL_TABLET | Freq: Once | ORAL | Status: AC
  Administered 2018-10-25: 1 via ORAL
  Filled 2018-10-25: qty 1

## 2018-10-25 MED ORDER — CYCLOBENZAPRINE HCL 10 MG PO TABS: 10.0000 mg | ORAL_TABLET | Freq: Two times a day (BID) | ORAL | 0 refills | Status: DC | PRN

## 2018-10-25 MED ORDER — HYDROCODONE-ACETAMINOPHEN 5-325 MG PO TABS: 1.0000 | ORAL_TABLET | Freq: Four times a day (QID) | ORAL | 0 refills | Status: AC | PRN

## 2018-10-25 NOTE — ED Notes (Signed)
Pt transported to XR.  

## 2018-10-25 NOTE — ED Notes (Signed)
ED Provider at bedside. 

## 2018-10-25 NOTE — ED Provider Notes (Signed)
[facility] EMERGENCY DEPARTMENT Provider Note   CSN: [id number] Arrival date & time: 10/25/18  1028     History   Chief Complaint Chief Complaint  Patient presents with  . Back Pain    HPI Darrell Horne is a 73 y.o. male.  73 year old male with past medical history below including hypertension, hyperlipidemia, type 2 diabetes mellitus, atrial fibrillation on anticoagulation who presents with low back pain.  Patient reports that he has a longstanding history of low back pain for many years.  He reports that it has been worse for the past 3 to 4 days, no trauma or overuse recently.  The pain goes across his entire lower back, no radiation.  He denies any bowel or bladder incontinence or saddle anesthesia.  He has peripheral neuropathy but no new changes in numbness.  He has difficulty ambulating both due to pain as well as problems with his left leg prosthesis.  The history is provided by the patient.  Back Pain    Past Medical History:  Diagnosis Date  . Chest pain   . Diabetes mellitus   . DM2 (diabetes mellitus, type 2) ([place])   . GERD (gastroesophageal reflux disease)   . HLD (hyperlipidemia)   . HTN (hypertension)   . PAF (paroxysmal atrial fibrillation) ([place])   . Palpitations     Patient Active Problem List   Diagnosis Date Noted  . DDD (degenerative disc disease), lumbar 08/17/2018  . DDD (degenerative disc disease), cervical 08/17/2018  . Hx of BKA, left ([place]) 11/11/2016  . High risk medication use 11/11/2016  . Syncope 04/17/2016  . Diabetic polyneuropathy associated with type 2 diabetes mellitus ([place]) 12/23/2015  . Alcoholic peripheral neuropathy ([place]) 12/23/2015  . Sensory ataxia 12/23/2015  . Headache 11/04/2015  . Chronic low back pain 11/04/2015  . Insomnia 11/04/2015  . Neuropathy due to medical condition ([place]) 04/08/2015  . Diabetes ([place]) 04/09/2014  . PAF (paroxysmal atrial fibrillation) ([place])   . DM2 (diabetes mellitus, type 2)  ([place])   . HTN (hypertension)   . Palpitations   . HLD (hyperlipidemia)     Past Surgical History:  Procedure Laterality Date  . APPENDECTOMY    . CHOLECYSTECTOMY    . COLONOSCOPY     Dr. Michail Sermon  . HERNIA REPAIR    . LEG AMPUTATION Left [id number]   Dr. Kendell Bane  . SHOULDER SURGERY  [phone]   Dr. Meridee Score  . TOTAL ANKLE ARTHROPLASTY  [id number]        Home Medications    Prior to Admission medications   Medication Sig Start Date End Date Taking? Authorizing Provider  amitriptyline (ELAVIL) 50 MG tablet Take 1 tablet (50 mg total) by mouth at bedtime. 01/24/18   Gildardo Cranker, DO  apixaban (ELIQUIS) 5 MG TABS tablet Take 1 tablet (5 mg total) by mouth 2 (two) times daily. 08/03/18   Gildardo Cranker, DO  atorvastatin (LIPITOR) 40 MG tablet Take one tablet by mouth once daily 08/01/18   Gildardo Cranker, DO  b complex vitamins tablet Take 1 tablet by mouth daily.    [provider]  Ritta Slot Modified 1.5 % LIQD Apply between 3rd and 4th toe with cotton swab before bedtime 08/10/18   Marzetta Board, DPM  cyclobenzaprine (FLEXERIL) 10 MG tablet Take 1 tablet (10 mg total) by mouth 2 (two) times daily as needed for muscle spasms. 10/25/18   Little, Wenda Overland, MD  gabapentin (NEURONTIN) 600 MG tablet Take 1 tablet (  600 mg total) by mouth 3 (three) times daily. 01/01/18   Narda Amber K, DO  gentamicin cream (GARAMYCIN) 0.1 % Apply 1 application topically 3 (three) times daily. 11/27/17   Edrick Kins, DPM  HYDROcodone-acetaminophen (NORCO/VICODIN) 5-325 MG tablet Take 1 tablet by mouth every 6 (six) hours as needed for up to 5 days. 10/25/18 10/30/18  Little, Wenda Overland, MD  Menthol (ICY HOT) 5 % [facility] Apply 1 patch topically as needed (back pain).    [provider]  metFORMIN (GLUCOPHAGE-XR) 500 MG 24 hr tablet Take two tablets by mouth daily with supper 08/01/18   Gildardo Cranker, DO  metoprolol succinate (TOPROL-XL) 50 MG 24 hr tablet Take 1  tablet (50 mg total) by mouth daily with supper. Take with or immediately following a meal. 09/20/18   Lauree Chandler, NP  omeprazole (PRILOSEC) 20 MG capsule Take 1 capsule (20 mg total) by mouth daily. 08/07/18 11/05/18  Gildardo Cranker, DO  Polyethyl Glycol-Propyl Glycol (SYSTANE OP) Place 1 drop into both eyes 2 (two) times daily as needed (dry eyes/ burning).    [provider]  topiramate (TOPAMAX) 50 MG tablet Take 1.5 tablets (75 mg total) by mouth daily. 08/14/18   Narda Amber K, DO  vitamin C (ASCORBIC ACID) 500 MG tablet Take 500 mg by mouth 2 (two) times daily.     [provider]    Family History Family History  Problem Relation Age of Onset  . Heart disease Mother        Living  . Stroke Father 29       Deceased  . Diabetes Sister   . Cancer Sister     Social History Social History   Tobacco Use  . Smoking status: Former Smoker    Years: 12.00    Last attempt to quit: 10/10/1981    Years since quitting: 37.0  . Smokeless tobacco: Never Used  Substance Use Topics  . Alcohol use: No    Comment: Previously drinking a fifth of liquor every two days x 13 years  . Drug use: No     Allergies   Patient has no known allergies.   Review of Systems Review of Systems  Musculoskeletal: Positive for back pain.   All other systems reviewed and are negative except that which was mentioned in HPI   Physical Exam Updated Vital Signs BP 107/65 (BP Location: Right Arm)   Pulse 64   Temp 98.2 F (36.8 C) (Oral)   Resp 17   SpO2 100%   Physical Exam Vitals signs and nursing note reviewed.  Constitutional:      General: He is not in acute distress.    Appearance: He is well-developed.  HENT:     Head: Normocephalic and atraumatic.  Eyes:     Conjunctiva/sclera: Conjunctivae normal.  Neck:     Musculoskeletal: Neck supple.  Cardiovascular:     Rate and Rhythm: Normal rate and regular rhythm.     Heart sounds: Normal heart sounds. No murmur.   Pulmonary:     Effort: Pulmonary effort is normal.     Breath sounds: Normal breath sounds.  Abdominal:     General: Bowel sounds are normal. There is no distension.     Palpations: Abdomen is soft.     Tenderness: There is no abdominal tenderness.  Musculoskeletal:     Right lower leg: No edema.     Comments: L BKA; Mild tenderness across entire lower lumbar back  Skin:  General: Skin is warm and dry.  Neurological:     Mental Status: He is alert and oriented to person, place, and time.     Comments: Fluent speech  Psychiatric:        Judgment: Judgment normal.      ED Treatments / Results  Labs (all labs ordered are listed, but only abnormal results are displayed) Labs Reviewed - No data to display  EKG None  Radiology Dg Lumbar Spine 2-3 Views  Result Date: 10/25/2018 CLINICAL DATA:  Low back pain.  Chronic back pain. EXAM: LUMBAR SPINE - 2-3 VIEW COMPARISON:  04/15/2018 FINDINGS: There is no evidence of lumbar spine fracture. Alignment is normal. Generalized osteopenia. Degenerative disc disease with disc height loss at L5-S1. Mild degenerative disc disease at L1-2 and L2-3. Abdominal aortic atherosclerosis. IMPRESSION: Stable degenerative disc disease with disc height loss at L5-S1 and to lesser extent L1-2 and L2-3. Electronically Signed   By: Kathreen Devoid   On: 10/25/2018 14:15    Procedures Procedures (including critical care time)  Medications Ordered in ED Medications  HYDROcodone-acetaminophen (NORCO/VICODIN) 5-325 MG per tablet 1 tablet (1 tablet Oral Given 10/25/18 1239)     Initial Impression / Assessment and Plan / ED Course  I have reviewed the triage vital signs and the nursing notes.  Pertinent imaging results that were available during my care of the patient were reviewed by me and considered in my medical decision making (see chart for details).     Pt neuro intact. No signs/sx cauda equina. No fevers or recent illness to suggest infectious  etiology. XR show DDD, expected given history of back problems. He follows w/ [place] [facility] therefore instructed to schedule f/u appt. he cannot take NSAIDs due to his anticoagulation therefore provided with a fewNorco tablets to use for breakthrough pain if Tylenol is not adequate.  Cautioned on using Tylenol and Norco at the same time.  Also cautioned on potential side effects of this medication and Flexeril.Extensively reviewed return precautions and he voiced understanding.  Final Clinical Impressions(s) / ED Diagnoses   Final diagnoses:  Low back pain  Bilateral low back pain without sciatica, unspecified chronicity    ED Discharge Orders         Ordered    HYDROcodone-acetaminophen (NORCO/VICODIN) 5-325 MG tablet  Every 6 hours PRN     10/25/18 1517    cyclobenzaprine (FLEXERIL) 10 MG tablet  2 times daily PRN     10/25/18 1517           Little, Wenda Overland, MD 10/25/18 1524

## 2018-10-25 NOTE — ED Notes (Signed)
Patient transported to X-ray 

## 2018-10-25 NOTE — ED Notes (Signed)
Pt endorses lumbar pain x 3-4 days. Pt states "my back went out on me" Has hx of same. No bowel or bladder problems. Pain when ambulating. Pt ambulates with cain at home but ambulating has been difficult since back pain started.

## 2018-10-25 NOTE — ED Notes (Signed)
Patient verbalizes understanding of discharge instructions. Opportunity for questioning and answers were provided. Armband removed by staff, pt discharged from ED. Pt wheeled to lobby and taken home by wife.  

## 2018-10-25 NOTE — ED Triage Notes (Signed)
Pt c/o lower back pain pain for 3 days w/o injury. Denies difficulty issues with bowels or bladder.

## 2018-10-29 ENCOUNTER — Encounter: Payer: Self-pay | Admitting: Nurse Practitioner

## 2018-10-29 ENCOUNTER — Ambulatory Visit (INDEPENDENT_AMBULATORY_CARE_PROVIDER_SITE_OTHER): Payer: Medicare HMO | Admitting: Nurse Practitioner

## 2018-10-29 VITALS — BP 118/68 | HR 70 | Temp 98.0°F | Ht 70.0 in | Wt 261.4 lb

## 2018-10-29 DIAGNOSIS — I480 Paroxysmal atrial fibrillation: Secondary | ICD-10-CM | POA: Diagnosis not present

## 2018-10-29 DIAGNOSIS — M545 Low back pain, unspecified: Secondary | ICD-10-CM

## 2018-10-29 DIAGNOSIS — E1142 Type 2 diabetes mellitus with diabetic polyneuropathy: Secondary | ICD-10-CM

## 2018-10-29 DIAGNOSIS — R51 Headache: Secondary | ICD-10-CM

## 2018-10-29 DIAGNOSIS — R519 Headache, unspecified: Secondary | ICD-10-CM

## 2018-10-29 DIAGNOSIS — I10 Essential (primary) hypertension: Secondary | ICD-10-CM

## 2018-10-29 NOTE — Patient Instructions (Addendum)
To have a routine follow up with [facility]

## 2018-10-29 NOTE — Progress Notes (Signed)
Careteam: Patient Care Team: Ngetich, Nelda Bucks, NP as PCP - General (Family Medicine) Clent Jacks, MD as Consulting Physician (Ophthalmology)  Advanced Directive information Does Patient Have a Medical Advance Directive?: Yes, Type of Advance Directive: [place], Does patient want to make changes to medical advance directive?: No - Patient declined  No Known Allergies  Chief Complaint  Patient presents with  . Medical Management of Chronic Issues    discuss form prior to having colonoscopy,declined flu vaccine      HPI: Patient is a 73 y.o. male seen in the office today routine follow up.   DM2 - controlled on metformin. Previously was followed by Endo Dr Loanne Drilling. Does not check blood sugars at home. No low blood sugars.  Has neuropathy that is uncontrolled and takes gabapentin 600 mg TID and elavil. He was supposed to stop elavil but resumed it as his neuropathy worsened.   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 He wears a prosthesis. He follows with podiatry routine.  He was tx for right geat toe ulcer which has healed.  uses a cane most times but does have a walker when anticipating prolonged standing/walking.   following with Dr Katy Fitch for eye care.   Urine micro/Cr ratio 11  HTN - BP stable on metoprolol  Hyperlipidemia - stable on lipitor. LDL 27  PAF - rate controlled on metoprolol. Takes eliquis for anticoagulation. He has not seen cardio Dr Oval Linsey since 2016. Underwent lexiscan myoview in 06/2015 which was nml. 2D echo showed moderate diastolic dysfunction with nml EF. Last ECG in 06/2015 showed 1st degree AVB and LBBB. Previous cardio Dr at [facility]. Rarely has palpitations  HA - overall controlled on topamax and elavil. Chronic intractable. Followed by neurology Dr Posey Pronto.  Insomnia - controlled on elavil  Chronic LBP - mostly uncontrolled in the past did desire to take norco or zanaflex. Most recenly prescribed flexeril  and hydrocodone due to recent flare but just using tylenol. followed by ortho . Of note, he underwent left rotator cuff repair in Dec 2016; alk phos 167.   Memory loss - he still does not drive. MMSE 28/30 in April 2019. Memory stable per wife.   Having colonoscopy done, recommended to hold eliquis 48 hours prior to procedure.   Review of Systems:  Review of Systems  Constitutional: Negative for chills, fever and weight loss.  HENT: Negative for tinnitus.   Respiratory: Negative for cough, sputum production and shortness of breath.   Cardiovascular: Negative for chest pain, palpitations and leg swelling.  Gastrointestinal: Negative for abdominal pain, constipation, diarrhea and heartburn.  Genitourinary: Negative for dysuria, frequency and urgency.  Musculoskeletal: Positive for back pain (has improved currently). Negative for falls, joint pain and myalgias.  Skin: Negative.   Neurological: Negative for dizziness and headaches.  Psychiatric/Behavioral: Positive for memory loss. Negative for depression. The patient does not have insomnia.     Past Medical History:  Diagnosis Date  . Chest pain   . Diabetes mellitus   . DM2 (diabetes mellitus, type 2) ([place])   . GERD (gastroesophageal reflux disease)   . HLD (hyperlipidemia)   . HTN (hypertension)   . PAF (paroxysmal atrial fibrillation) ([place])   . Palpitations    Past Surgical History:  Procedure Laterality Date  . APPENDECTOMY    . CHOLECYSTECTOMY    . COLONOSCOPY     Dr. Michail Sermon  . HERNIA REPAIR    . LEG AMPUTATION Left [id number]  Dr. Kendell Bane  . SHOULDER SURGERY  [phone]   Dr. Meridee Score  . TOTAL ANKLE ARTHROPLASTY  [id number]   Social History:   reports that he quit smoking about 37 years ago. He quit after 12.00 years of use. He has never used smokeless tobacco. He reports that he does not drink alcohol or use drugs.  Family History  Problem Relation Age of Onset  . Heart disease Mother        Living  .  Stroke Father 49       Deceased  . Diabetes Sister   . Cancer Sister     Medications: Patient's Medications  New Prescriptions   No medications on file  Previous Medications   AMITRIPTYLINE (ELAVIL) 50 MG TABLET    Take 1 tablet (50 mg total) by mouth at bedtime.   APIXABAN (ELIQUIS) 5 MG TABS TABLET    Take 1 tablet (5 mg total) by mouth 2 (two) times daily.   ATORVASTATIN (LIPITOR) 40 MG TABLET    Take one tablet by mouth once daily   B COMPLEX VITAMINS TABLET    Take 1 tablet by mouth daily.   CYCLOBENZAPRINE (FLEXERIL) 10 MG TABLET    Take 1 tablet (10 mg total) by mouth 2 (two) times daily as needed for muscle spasms.   GABAPENTIN (NEURONTIN) 600 MG TABLET    Take 1 tablet (600 mg total) by mouth 3 (three) times daily.   GENTAMICIN CREAM (GARAMYCIN) 0.1 %    Apply 1 application topically 3 (three) times daily.   HYDROCODONE-ACETAMINOPHEN (NORCO/VICODIN) 5-325 MG TABLET    Take 1 tablet by mouth every 6 (six) hours as needed for up to 5 days.   MENTHOL (ICY HOT) 5 % PTCH    Apply 1 patch topically as needed (back pain).   METFORMIN (GLUCOPHAGE-XR) 500 MG 24 HR TABLET    Take two tablets by mouth daily with supper   METOPROLOL SUCCINATE (TOPROL-XL) 50 MG 24 HR TABLET    Take 1 tablet (50 mg total) by mouth daily with supper. Take with or immediately following a meal.   POLYETHYL GLYCOL-PROPYL GLYCOL (SYSTANE OP)    Place 1 drop into both eyes 2 (two) times daily as needed (dry eyes/ burning).   TOPIRAMATE (TOPAMAX) 50 MG TABLET    Take 1.5 tablets (75 mg total) by mouth daily.   VITAMIN C (ASCORBIC ACID) 500 MG TABLET    Take 500 mg by mouth 2 (two) times daily.   Modified Medications   No medications on file  Discontinued Medications   CASTELLANI PAINT MODIFIED 1.5 % LIQD    Apply between 3rd and 4th toe with cotton swab before bedtime   OMEPRAZOLE (PRILOSEC) 20 MG CAPSULE    Take 1 capsule (20 mg total) by mouth daily.     Physical Exam:  Vitals:   10/29/18 1019  BP: 118/68    Pulse: 70  Temp: 98 F (36.7 C)  TempSrc: Oral  SpO2: 100%  Weight: 261 lb 6.4 oz (118.6 kg)  Height: _0  (1.778 m)   Body mass index is 37.51 kg/m.  Physical Exam Constitutional:      Comments: Obese elderly in no acute distress  HENT:     Head: Normocephalic.     Right Ear: External ear normal.     Left Ear: External ear normal.     Mouth/Throat:     Pharynx: No oropharyngeal exudate.  Eyes:     General: No scleral icterus.  Right eye: No discharge.        Left eye: No discharge.     Conjunctiva/sclera: Conjunctivae normal.     Pupils: Pupils are equal, round, and reactive to light.  Neck:     Musculoskeletal: Normal range of motion.     Thyroid: No thyromegaly.     Vascular: No JVD.  Cardiovascular:     Rate and Rhythm: Normal rate and regular rhythm.     Heart sounds: Normal heart sounds. No murmur. No friction rub. No gallop.   Pulmonary:     Effort: Pulmonary effort is normal. No respiratory distress.     Breath sounds: Normal breath sounds. No wheezing or rales.  Abdominal:     General: Bowel sounds are normal. There is no distension.     Palpations: Abdomen is soft. There is no mass.     Tenderness: There is no abdominal tenderness. There is no guarding or rebound.  Musculoskeletal:        General: No tenderness.     Comments: Unsteady gait ambulates with right hand cane.Left BKA.Right ankle 1+ edema.   Lymphadenopathy:     Cervical: No cervical adenopathy.  Skin:    General: Skin is warm and dry.     Coloration: Skin is not pale.     Findings: No erythema or rash.  Neurological:     Gait: Gait abnormal.  Psychiatric:        Speech: Speech normal.        Behavior: Behavior normal.        Thought Content: Thought content normal.        Judgment: Judgment normal.     Labs reviewed: Basic Metabolic Panel: Recent Labs    10/31/17 1019 04/15/18 1300  NA 139 138  K 4.4 4.1  CL 106 109  CO2 29 24  GLUCOSE 117* 105*  BUN 11 8   CREATININE 0.87 0.90  CALCIUM 9.3 8.6*  TSH 1.37  --    Liver Function Tests: Recent Labs    10/31/17 1019  AST 15  ALT 17  BILITOT 0.5  PROT 6.6   No results for input(s): LIPASE, AMYLASE in the last 8760 hours. No results for input(s): AMMONIA in the last 8760 hours. CBC: Recent Labs    10/31/17 1019 04/15/18 1300  WBC 7.1 6.7  NEUTROABS  --  4.6  HGB 13.4 13.2  HCT 40.3 41.8  MCV 87.6 96.1  PLT CANCELED 152   Lipid Panel: Recent Labs    10/31/17 1019  CHOL 127  HDL 85  LDLCALC 27  TRIG 66  CHOLHDL 1.5   TSH: Recent Labs    10/31/17 1019  TSH 1.37   A1C: Lab Results  Component Value Date   HGBA1C 6.1 (H) 10/31/2017     Assessment/Plan 1. Low back pain without sciatica, unspecified back pain laterality, unspecified chronicity Recently with flare, much better at this time. Not needing to take hydrocodone or muscle relaxer. Plans to follow up with orthopedic.   2. Headache Followed by neurologist, controlled at this time. Will continue current regimen.   3. Diabetic polyneuropathy associated with type 2 diabetes mellitus (HCC) Stable, will continue current regimen. - CBC with Differential/Platelets  4. Essential hypertension -controlled on metoprolol  5. PAF (paroxysmal atrial fibrillation) (HCC) Rate controlled on metoprolol. Continues on eliquis for anticoagulation.  -pt plans to get colonoscopy, will hold eliquis 48 hours prior to colonoscopy and 24 hours after if polyp removed to prevent bleeding.  -  COMPLETE METABOLIC PANEL WITH GFR  6. Type 2 diabetes mellitus with diabetic polyneuropathy, unspecified whether long term insulin use ([place]) Does not check blood sugars at home. Continues on metformin. Will follow up lab today.  - Hemoglobin A1c - CBC with Differential/Platelets - Microalbumin, urine  Next appt: 3 months  Titus Drone K. [place], [facility] [phone]

## 2018-10-30 LAB — CBC WITH DIFFERENTIAL/PLATELET
Absolute Monocytes: 386 cells/uL (ref 200–950)
Basophils Absolute: 7 cells/uL (ref 0–200)
Basophils Relative: 0.1 %
Eosinophils Absolute: 173 cells/uL (ref 15–500)
Eosinophils Relative: 2.5 %
HCT: 41.2 % (ref 38.5–50.0)
Hemoglobin: 13.9 g/dL (ref 13.2–17.1)
Lymphs Abs: 2201 cells/uL (ref 850–3900)
MCH: 30.8 pg (ref 27.0–33.0)
MCHC: 33.7 g/dL (ref 32.0–36.0)
MCV: 91.4 fL (ref 80.0–100.0)
MPV: 12.3 fL (ref 7.5–12.5)
Monocytes Relative: 5.6 %
Neutro Abs: 4133 cells/uL (ref 1500–7800)
Neutrophils Relative %: 59.9 %
Platelets: 99 10*3/uL — ABNORMAL LOW (ref 140–400)
RBC: 4.51 10*6/uL (ref 4.20–5.80)
RDW: 12.4 % (ref 11.0–15.0)
Total Lymphocyte: 31.9 %
WBC: 6.9 10*3/uL (ref 3.8–10.8)

## 2018-10-30 LAB — HEMOGLOBIN A1C
Hgb A1c MFr Bld: 6 % of total Hgb — ABNORMAL HIGH (ref <5.7)
Mean Plasma Glucose: 126 (calc)
eAG (mmol/L): 7 (calc)

## 2018-10-30 LAB — COMPLETE METABOLIC PANEL WITH GFR
AG Ratio: 1.3 (calc) (ref 1.0–2.5)
ALT: 19 U/L (ref 9–46)
AST: 19 U/L (ref 10–35)
Albumin: 3.7 g/dL (ref 3.6–5.1)
Alkaline phosphatase (APISO): 129 U/L — ABNORMAL HIGH (ref 40–115)
BUN: 9 mg/dL (ref 7–25)
CO2: 23 mmol/L (ref 20–32)
Calcium: 9 mg/dL (ref 8.6–10.3)
Chloride: 111 mmol/L — ABNORMAL HIGH (ref 98–110)
Creat: 0.79 mg/dL (ref 0.70–1.18)
GFR, Est African American: 103 mL/min/{1.73_m2} (ref >=60)
GFR, Est Non African American: 89 mL/min/{1.73_m2} (ref >=60)
Globulin: 2.8 g/dL (calc) (ref 1.9–3.7)
Glucose, Bld: 112 mg/dL (ref 65–139)
Potassium: 4.4 mmol/L (ref 3.5–5.3)
Sodium: 143 mmol/L (ref 135–146)
Total Bilirubin: 0.5 mg/dL (ref 0.2–1.2)
Total Protein: 6.5 g/dL (ref 6.1–8.1)

## 2018-10-30 LAB — MICROALBUMIN, URINE: Microalb, Ur: <0.2 mg/dL

## 2018-10-31 ENCOUNTER — Encounter (INDEPENDENT_AMBULATORY_CARE_PROVIDER_SITE_OTHER): Payer: Self-pay | Admitting: Family Medicine

## 2018-10-31 ENCOUNTER — Ambulatory Visit (INDEPENDENT_AMBULATORY_CARE_PROVIDER_SITE_OTHER): Payer: Medicare HMO | Admitting: Family Medicine

## 2018-10-31 DIAGNOSIS — M545 Low back pain, unspecified: Secondary | ICD-10-CM

## 2018-10-31 DIAGNOSIS — G8929 Other chronic pain: Secondary | ICD-10-CM

## 2018-10-31 NOTE — Progress Notes (Signed)
Office Visit Note   Patient: Darrell Horne           Date of Birth: May 14, 1946           MRN: [id number] Visit Date: 10/31/2018 Requested by: Sandrea Hughs, NP [id number] [place], [place] [address] PCP: Sandrea Hughs, NP  Subjective: Chief Complaint  Patient presents with  . Lower Back - Pain    Could hardly move last Thursday - went to  [facility] ED. Improvement after starting Flexeril and Norco. Had been in PT, but it is on hold until he gets a new socket for his left leg prosthesis.    HPI: He is a 73 year old with low back pain.  Longstanding problems with his back since he was in his 73s.  He recalls bending over to pull a spare tire out of his car and feeling a severe pain.  He has had problems with his back ever since then.  Recently he had a flareup of pain that was so severe he had to go to the ER and get some medications.  After 1 dose his pain improved significantly and he is now back at his baseline.  He has been working with a physical therapist but it is expensive for him to go.  He is interested in starting a workout regimen at the [facility] which also has a swimming pool.  He is diabetic and does not follow a diabetic diet.  He eats a lot of sweets.  His A1c is still around 6.5.  He has peripheral neuropathy in the upper and lower extremities.  He is status post left BKA.  He is getting fitted for a new prosthesis soon.                ROS: All other systems were reviewed and are negative as pertains to the chief complaint.  Objective: Vital Signs: There were no vitals taken for this visit.  Physical Exam:  Low back: He is tender in the midline at the L5-S1 level.  Tender also near the left SI joint.  Mild pain in the left sciatic notch.  Hip flexion strength is normal.  Imaging: None today.  X-rays from the ER showed moderate to severe L5-S1 degenerative disc disease.  He has diffuse mild degenerative disc disease.  Calcification of the abdominal aorta.  No sign of  compression fracture.  Assessment & Plan: 1.  Improved chronic low back pain -Referral to [facility] and physical therapy to learn a home exercise program, including water therapy, that he can take to the [facility] to use with the personal trainer there. -Follow-up as needed.   Follow-Up Instructions: No follow-ups on file.      Procedures: No procedures performed  No notes on file    PMFS History: Patient Active Problem List   Diagnosis Date Noted  . DDD (degenerative disc disease), lumbar 08/17/2018  . DDD (degenerative disc disease), cervical 08/17/2018  . Hx of BKA, left ([place]) 11/11/2016  . High risk medication use 11/11/2016  . Syncope 04/17/2016  . Diabetic polyneuropathy associated with type 2 diabetes mellitus ([place]) 12/23/2015  . Alcoholic peripheral neuropathy ([place]) 12/23/2015  . Sensory ataxia 12/23/2015  . Headache 11/04/2015  . Chronic low back pain 11/04/2015  . Insomnia 11/04/2015  . Neuropathy due to medical condition ([place]) 04/08/2015  . Diabetes ([place]) 04/09/2014  . PAF (paroxysmal atrial fibrillation) ([place])   . DM2 (diabetes mellitus, type 2) ([place])   . HTN (hypertension)   .  Palpitations   . HLD (hyperlipidemia)    Past Medical History:  Diagnosis Date  . Chest pain   . Diabetes mellitus   . DM2 (diabetes mellitus, type 2) ([place])   . GERD (gastroesophageal reflux disease)   . HLD (hyperlipidemia)   . HTN (hypertension)   . PAF (paroxysmal atrial fibrillation) ([place])   . Palpitations     Family History  Problem Relation Age of Onset  . Heart disease Mother        Living  . Stroke Father 56       Deceased  . Diabetes Sister   . Cancer Sister     Past Surgical History:  Procedure Laterality Date  . APPENDECTOMY    . CHOLECYSTECTOMY    . COLONOSCOPY     Dr. Michail Sermon  . HERNIA REPAIR    . LEG AMPUTATION Left [phone]   Dr. Kendell Bane  . SHOULDER SURGERY  [phone]   Dr. Meridee Score  . TOTAL ANKLE ARTHROPLASTY  [phone]   Social  History   Occupational History  . Occupation: retired Nurse, mental health  . Smoking status: Former Smoker    Years: 12.00    Last attempt to quit: 10/10/1981    Years since quitting: 37.0  . Smokeless tobacco: Never Used  Substance and Sexual Activity  . Alcohol use: No    Comment: Previously drinking a fifth of liquor every two days x 13 years  . Drug use: No  . Sexual activity: Never

## 2018-11-01 DIAGNOSIS — Z89512 Acquired absence of left leg below knee: Secondary | ICD-10-CM | POA: Diagnosis not present

## 2018-11-02 ENCOUNTER — Other Ambulatory Visit: Payer: Self-pay | Admitting: Gastroenterology

## 2018-11-06 ENCOUNTER — Other Ambulatory Visit: Payer: Self-pay

## 2018-11-06 ENCOUNTER — Ambulatory Visit (HOSPITAL_COMMUNITY): Payer: Medicare HMO | Admitting: Certified Registered"

## 2018-11-06 ENCOUNTER — Ambulatory Visit (HOSPITAL_COMMUNITY)
Admission: RE | Admit: 2018-11-06 | Discharge: 2018-11-06 | Disposition: A | Discharge status: Home / Self Care | Payer: Medicare HMO | Attending: Gastroenterology | Admitting: Gastroenterology

## 2018-11-06 ENCOUNTER — Encounter (HOSPITAL_COMMUNITY)
Admission: RE | Disposition: A | Discharge status: Home / Self Care | Payer: Self-pay | Source: Home / Self Care | Attending: Gastroenterology

## 2018-11-06 ENCOUNTER — Encounter (HOSPITAL_COMMUNITY): Payer: Self-pay

## 2018-11-06 DIAGNOSIS — Z860100 Personal history of colon polyps, unspecified: Secondary | ICD-10-CM

## 2018-11-06 DIAGNOSIS — Z89512 Acquired absence of left leg below knee: Secondary | ICD-10-CM | POA: Insufficient documentation

## 2018-11-06 DIAGNOSIS — Z87891 Personal history of nicotine dependence: Secondary | ICD-10-CM | POA: Insufficient documentation

## 2018-11-06 DIAGNOSIS — Z79899 Other long term (current) drug therapy: Secondary | ICD-10-CM | POA: Diagnosis not present

## 2018-11-06 DIAGNOSIS — E785 Hyperlipidemia, unspecified: Secondary | ICD-10-CM | POA: Diagnosis not present

## 2018-11-06 DIAGNOSIS — Z09 Encounter for follow-up examination after completed treatment for conditions other than malignant neoplasm: Secondary | ICD-10-CM | POA: Diagnosis present

## 2018-11-06 DIAGNOSIS — I480 Paroxysmal atrial fibrillation: Secondary | ICD-10-CM | POA: Insufficient documentation

## 2018-11-06 DIAGNOSIS — R413 Other amnesia: Secondary | ICD-10-CM | POA: Diagnosis not present

## 2018-11-06 DIAGNOSIS — Q438 Other specified congenital malformations of intestine: Secondary | ICD-10-CM | POA: Insufficient documentation

## 2018-11-06 DIAGNOSIS — G8929 Other chronic pain: Secondary | ICD-10-CM | POA: Diagnosis not present

## 2018-11-06 DIAGNOSIS — E1142 Type 2 diabetes mellitus with diabetic polyneuropathy: Secondary | ICD-10-CM | POA: Insufficient documentation

## 2018-11-06 DIAGNOSIS — K640 First degree hemorrhoids: Secondary | ICD-10-CM | POA: Insufficient documentation

## 2018-11-06 DIAGNOSIS — Z7901 Long term (current) use of anticoagulants: Secondary | ICD-10-CM | POA: Insufficient documentation

## 2018-11-06 DIAGNOSIS — Z7984 Long term (current) use of oral hypoglycemic drugs: Secondary | ICD-10-CM | POA: Diagnosis not present

## 2018-11-06 DIAGNOSIS — I10 Essential (primary) hypertension: Secondary | ICD-10-CM | POA: Diagnosis not present

## 2018-11-06 DIAGNOSIS — M545 Low back pain: Secondary | ICD-10-CM | POA: Insufficient documentation

## 2018-11-06 DIAGNOSIS — D125 Benign neoplasm of sigmoid colon: Secondary | ICD-10-CM | POA: Diagnosis not present

## 2018-11-06 DIAGNOSIS — R51 Headache: Secondary | ICD-10-CM | POA: Diagnosis not present

## 2018-11-06 DIAGNOSIS — Z96669 Presence of unspecified artificial ankle joint: Secondary | ICD-10-CM | POA: Diagnosis not present

## 2018-11-06 HISTORY — PX: POLYPECTOMY: SHX5525

## 2018-11-06 HISTORY — PX: COLONOSCOPY WITH PROPOFOL: SHX5780

## 2018-11-06 LAB — GLUCOSE, CAPILLARY: Glucose-Capillary: 85 mg/dL (ref 70–99)

## 2018-11-06 SURGERY — COLONOSCOPY WITH PROPOFOL
Anesthesia: Monitor Anesthesia Care

## 2018-11-06 MED ORDER — LACTATED RINGERS IV SOLN
INTRAVENOUS | Status: DC
  Administered 2018-11-06: 10:00:00 via INTRAVENOUS

## 2018-11-06 MED ORDER — SODIUM CHLORIDE 0.9 % IV SOLN: INTRAVENOUS | Status: DC

## 2018-11-06 MED ORDER — PROPOFOL 10 MG/ML IV BOLUS
INTRAVENOUS | Status: AC
  Filled 2018-11-06: qty 20

## 2018-11-06 MED ORDER — PROPOFOL 500 MG/50ML IV EMUL
INTRAVENOUS | Status: DC | PRN
  Administered 2018-11-06: 125 ug/kg/min via INTRAVENOUS

## 2018-11-06 MED ORDER — PROPOFOL 10 MG/ML IV BOLUS
INTRAVENOUS | Status: DC | PRN
  Administered 2018-11-06 (×4): 20 mg via INTRAVENOUS

## 2018-11-06 SURGICAL SUPPLY — 21 items

## 2018-11-06 NOTE — Anesthesia Procedure Notes (Signed)
Procedure Name: MAC Date/Time: 11/06/2018 10:36 AM Performed by: Eben Burow, CRNA Pre-anesthesia Checklist: Patient identified, Emergency Drugs available, Suction available, Patient being monitored and Timeout performed Oxygen Delivery Method: Simple face mask Dental Injury: Teeth and Oropharynx as per pre-operative assessment

## 2018-11-06 NOTE — Discharge Instructions (Signed)

## 2018-11-06 NOTE — H&P (Signed)
  Date of Initial H&P: 10/29/18  History reviewed, patient examined, no change in status, stable for surgery.

## 2018-11-06 NOTE — Op Note (Signed)
[facility] Patient Name: Darrell Horne Procedure Date: 11/06/2018 MRN: [id number] Attending MD: Lear Ng , MD Date of Birth: May 24, 1946 CSN: [id number] Age: 73 Admit Type: Outpatient Procedure:                Colonoscopy Indications:              High risk colon cancer surveillance: Personal                            history of colonic polyps, Last colonoscopy:                            November 2015 Providers:                Lear Ng, MD, Carlyn Reichert, RN, Charolette Child, Technician Referring MD:              Medicines:                Propofol per Anesthesia, Monitored Anesthesia Care Complications:            No immediate complications. Estimated Blood Loss:     Estimated blood loss was minimal. Procedure:                Pre-Anesthesia Assessment:                           - Prior to the procedure, a History and Physical                            was performed, and patient medications and                            allergies were reviewed. The patient's tolerance of                            previous anesthesia was also reviewed. The risks                            and benefits of the procedure and the sedation                            options and risks were discussed with the patient.                            All questions were answered, and informed consent                            was obtained. Prior Anticoagulants: The patient has                            taken Eliquis (apixaban), last dose was 2 days  prior to procedure. ASA Grade Assessment: III - A                            patient with severe systemic disease. After                            reviewing the risks and benefits, the patient was                            deemed in satisfactory condition to undergo the                            procedure.                           After obtaining informed consent, the  colonoscope                            was passed under direct vision. Throughout the                            procedure, the patient's blood pressure, pulse, and                            oxygen saturations were monitored continuously. The                            [id number]-[id number] ([id number] Olympus pediatric colonscope                            was introduced through the anus and advanced to the                            the cecum, identified by appendiceal orifice and                            ileocecal valve. The colonoscopy was performed with                            difficulty due to significant looping and a                            tortuous colon. Successful completion of the                            procedure was aided by straightening and shortening                            the scope to obtain bowel loop reduction, using                            scope torsion and applying abdominal pressure. The  patient tolerated the procedure well. The quality                            of the bowel preparation was adequate and good. The                            ileocecal valve, appendiceal orifice, and rectum                            were photographed. Scope In: 10:44:02 AM Scope Out: 10:57:19 AM Scope Withdrawal Time: 0 hours 8 minutes 17 seconds  Total Procedure Duration: 0 hours 13 minutes 17 seconds  Findings:      The perianal and digital rectal examinations were normal.      A 1 mm polyp was found in the sigmoid colon. The polyp was flat. The       polyp was removed with a cold biopsy forceps. Resection and retrieval       were complete. Estimated blood loss was minimal.      Internal hemorrhoids were found during retroflexion. The hemorrhoids       were medium-sized and Grade I (internal hemorrhoids that do not       prolapse). Impression:               - One 1 mm polyp in the sigmoid colon, removed with                            a cold  biopsy forceps. Resected and retrieved.                           - Internal hemorrhoids. Moderate Sedation:      Not Applicable - Patient had care per Anesthesia. Recommendation:           - Patient has a contact number available for                            emergencies. The signs and symptoms of potential                            delayed complications were discussed with the                            patient. Return to normal activities tomorrow.                            Written discharge instructions were provided to the                            patient.                           - High fiber diet.                           - Await pathology results.                           -  Repeat colonoscopy for surveillance based on                            pathology results.                           - Resume Eliquis (apixaban) at prior dose today. Procedure Code(s):        --- Professional ---                           [phone], Colonoscopy, flexible; with biopsy, single                            or multiple Diagnosis Code(s):        --- Professional ---                           Z86.010, Personal history of colonic polyps                           D12.5, Benign neoplasm of sigmoid colon                           K64.0, First degree hemorrhoids CPT copyright 2018 American Medical Association. All rights reserved. The codes documented in this report are preliminary and upon coder review may  be revised to meet current compliance requirements. Lear Ng, MD 11/06/2018 11:08:34 AM This report has been signed electronically. Number of Addenda: 0

## 2018-11-06 NOTE — Transfer of Care (Signed)
Immediate Anesthesia Transfer of Care Note  Patient: Darrell Horne  Procedure(s) Performed: COLONOSCOPY WITH PROPOFOL (N/A ) POLYPECTOMY  Patient Location: PACU and Endoscopy Unit  Anesthesia Type:MAC  Level of Consciousness: awake, alert  and drowsy  Airway & Oxygen Therapy: Patient Spontanous Breathing and Patient connected to face mask oxygen  Post-op Assessment: Report given to RN and Post -op Vital signs reviewed and stable  Post vital signs: Reviewed and stable  Last Vitals:  Vitals Value Taken Time  BP    Temp    Pulse    Resp    SpO2      Last Pain:  Vitals:   11/06/18 0935  TempSrc: Oral  PainSc: 0-No pain         Complications: No apparent anesthesia complications

## 2018-11-06 NOTE — Anesthesia Postprocedure Evaluation (Signed)
Anesthesia Post Note  Patient: Darrell Horne  Procedure(s) Performed: COLONOSCOPY WITH PROPOFOL (N/A ) POLYPECTOMY     Patient location during evaluation: PACU Anesthesia Type: MAC Level of consciousness: awake and alert Pain management: pain level controlled Vital Signs Assessment: post-procedure vital signs reviewed and stable Respiratory status: spontaneous breathing, nonlabored ventilation, respiratory function stable and patient connected to nasal cannula oxygen Cardiovascular status: stable and blood pressure returned to baseline Postop Assessment: no apparent nausea or vomiting Anesthetic complications: no    Last Vitals:  Vitals:   11/06/18 1110 11/06/18 1130  BP: (!) 110/56 (!) 117/95  Pulse: 63 65  Resp: 16 (!) 25  Temp:    SpO2: 100% 100%    Last Pain:  Vitals:   11/06/18 1109  TempSrc: Oral  PainSc: 0-No pain                 Ryan P Ellender

## 2018-11-06 NOTE — Interval H&P Note (Signed)
History and Physical Interval Note:  11/06/2018 10:31 AM  Darrell Horne  has presented today for surgery, with the diagnosis of history of colon polyps  The various methods of treatment have been discussed with the patient and family. After consideration of risks, benefits and other options for treatment, the patient has consented to  Procedure(s): COLONOSCOPY WITH PROPOFOL (N/A) as a surgical intervention .  The patient's history has been reviewed, patient examined, no change in status, stable for surgery.  I have reviewed the patient's chart and labs.  Questions were answered to the patient's satisfaction.     Lear Ng

## 2018-11-06 NOTE — Anesthesia Preprocedure Evaluation (Addendum)
Anesthesia Evaluation  Patient identified by MRN, date of birth, ID band Patient awake    Reviewed: Allergy & Precautions, NPO status , Patient's Chart, lab work & pertinent test results  Airway Mallampati: III  TM Distance: >3 FB Neck ROM: Full    Dental  (+) Upper Dentures, Lower Dentures   Pulmonary former smoker,    Pulmonary exam normal breath sounds clear to auscultation       Cardiovascular hypertension, Pt. on medications and Pt. on home beta blockers Normal cardiovascular exam+ dysrhythmias Atrial Fibrillation  Rhythm:Regular Rate:Normal  ECG: rate 84. Borderline 1st degree AVB @ 84 bpm, LAD   Neuro/Psych  Headaches, negative psych ROS   GI/Hepatic negative GI ROS, Neg liver ROS,   Endo/Other  diabetes, Oral Hypoglycemic Agents  Renal/GU negative Renal ROS     Musculoskeletal negative musculoskeletal ROS (+)   Abdominal (+) + obese,   Peds  Hematology HLD   Anesthesia Other Findings History of colon polyps  Reproductive/Obstetrics                            Anesthesia Physical Anesthesia Plan  ASA: III  Anesthesia Plan: MAC   Post-op Pain Management:    Induction: Intravenous  PONV Risk Score and Plan: 1 and Propofol infusion and Treatment may vary due to age or medical condition  Airway Management Planned: Nasal Cannula  Additional Equipment:   Intra-op Plan:   Post-operative Plan: Extubation in OR  Informed Consent: I have reviewed the patients History and Physical, chart, labs and discussed the procedure including the risks, benefits and alternatives for the proposed anesthesia with the patient or authorized representative who has indicated his/her understanding and acceptance.       Plan Discussed with: CRNA  Anesthesia Plan Comments:        Anesthesia Quick Evaluation

## 2018-11-07 ENCOUNTER — Ambulatory Visit: Payer: Medicare HMO | Admitting: Podiatry

## 2018-11-07 ENCOUNTER — Encounter (HOSPITAL_COMMUNITY): Payer: Self-pay | Admitting: Gastroenterology

## 2018-11-07 DIAGNOSIS — Z89512 Acquired absence of left leg below knee: Secondary | ICD-10-CM

## 2018-11-07 DIAGNOSIS — M79676 Pain in unspecified toe(s): Secondary | ICD-10-CM | POA: Diagnosis not present

## 2018-11-07 DIAGNOSIS — B351 Tinea unguium: Secondary | ICD-10-CM

## 2018-11-07 NOTE — Patient Instructions (Addendum)
Onychomycosis/Fungal Toenails  WHAT IS IT? An infection that lies within the keratin of your nail plate that is caused by a fungus.  WHY ME? Fungal infections affect all ages, sexes, races, and creeds.  There may be many factors that predispose you to a fungal infection such as age, coexisting medical conditions such as diabetes, or an autoimmune disease; stress, medications, fatigue, genetics, etc.  Bottom line: fungus thrives in a warm, moist environment and your shoes offer such a location.  IS IT CONTAGIOUS? Theoretically, yes.  You do not want to share shoes, nail clippers or files with someone who has fungal toenails.  Walking around barefoot in the same room or sleeping in the same bed is unlikely to transfer the organism.  It is important to realize, however, that fungus can spread easily from one nail to the next on the same foot.  HOW DO WE TREAT THIS?  There are several ways to treat this condition.  Treatment may depend on many factors such as age, medications, pregnancy, liver and kidney conditions, etc.  It is best to ask your doctor which options are available to you.  1. No treatment.   Unlike many other medical concerns, you can live with this condition.  However for many people this can be a painful condition and may lead to ingrown toenails or a bacterial infection.  It is recommended that you keep the nails cut short to help reduce the amount of fungal nail. 2. Topical treatment.  These range from herbal remedies to prescription strength nail lacquers.  About 40-50% effective, topicals require twice daily application for approximately 9 to 12 months or until an entirely new nail has grown out.  The most effective topicals are medical grade medications available through physicians offices. 3. Oral antifungal medications.  With an 80-90% cure rate, the most common oral medication requires 3 to 4 months of therapy and stays in your system for a year as the new nail grows out.  Oral  antifungal medications do require blood work to make sure it is a safe drug for you.  A liver function panel will be performed prior to starting the medication and after the first month of treatment.  It is important to have the blood work performed to avoid any harmful side effects.  In general, this medication safe but blood work is required. 4. Laser Therapy.  This treatment is performed by applying a specialized laser to the affected nail plate.  This therapy is noninvasive, fast, and non-painful.  It is not covered by insurance and is therefore, out of pocket.  The results have been very good with a 80-95% cure rate.  [facility] is the only practice in the area to offer this therapy. 5. Permanent Nail Avulsion.  Removing the entire nail so that a new nail will not grow back.  Diabetes Mellitus and Foot Care Foot care is an important part of your health, especially when you have diabetes. Diabetes may cause you to have problems because of poor blood flow (circulation) to your feet and legs, which can cause your skin to:  Become thinner and drier.  Break more easily.  Heal more slowly.  Peel and crack. You may also have nerve damage (neuropathy) in your legs and feet, causing decreased feeling in them. This means that you may not notice minor injuries to your feet that could lead to more serious problems. Noticing and addressing any potential problems early is the best way to prevent   future foot problems. How to care for your feet Foot hygiene  Wash your feet daily with warm water and mild soap. Do not use hot water. Then, pat your feet and the areas between your toes until they are completely dry. Do not soak your feet as this can dry your skin.  Trim your toenails straight across. Do not dig under them or around the cuticle. File the edges of your nails with an emery board or nail file.  Apply a moisturizing lotion or petroleum jelly to the skin on your feet and to dry, brittle  toenails. Use lotion that does not contain alcohol and is unscented. Do not apply lotion between your toes. Shoes and socks  Wear clean socks or stockings every day. Make sure they are not too tight. Do not wear knee-high stockings since they may decrease blood flow to your legs.  Wear shoes that fit properly and have enough cushioning. Always look in your shoes before you put them on to be sure there are no objects inside.  To break in new shoes, wear them for just a few hours a day. This prevents injuries on your feet. Wounds, scrapes, corns, and calluses  Check your feet daily for blisters, cuts, bruises, sores, and redness. If you cannot see the bottom of your feet, use a mirror or ask someone for help.  Do not cut corns or calluses or try to remove them with medicine.  If you find a minor scrape, cut, or break in the skin on your feet, keep it and the skin around it clean and dry. You may clean these areas with mild soap and water. Do not clean the area with peroxide, alcohol, or iodine.  If you have a wound, scrape, corn, or callus on your foot, look at it several times a day to make sure it is healing and not infected. Check for: ? Redness, swelling, or pain. ? Fluid or blood. ? Warmth. ? Pus or a bad smell. General instructions  Do not cross your legs. This may decrease blood flow to your feet.  Do not use heating pads or hot water bottles on your feet. They may burn your skin. If you have lost feeling in your feet or legs, you may not know this is happening until it is too late.  Protect your feet from hot and cold by wearing shoes, such as at the beach or on hot pavement.  Schedule a complete foot exam at least once a year (annually) or more often if you have foot problems. If you have foot problems, report any cuts, sores, or bruises to your health care provider immediately. Contact a health care provider if:  You have a medical condition that increases your risk of  infection and you have any cuts, sores, or bruises on your feet.  You have an injury that is not healing.  You have redness on your legs or feet.  You feel burning or tingling in your legs or feet.  You have pain or cramps in your legs and feet.  Your legs or feet are numb.  Your feet always feel cold.  You have pain around a toenail. Get help right away if:  You have a wound, scrape, corn, or callus on your foot and: ? You have pain, swelling, or redness that gets worse. ? You have fluid or blood coming from the wound, scrape, corn, or callus. ? Your wound, scrape, corn, or callus feels warm to the touch. ? You   have pus or a bad smell coming from the wound, scrape, corn, or callus. ? You have a fever. ? You have a red line going up your leg. Summary  Check your feet every day for cuts, sores, red spots, swelling, and blisters.  Moisturize feet and legs daily.  Wear shoes that fit properly and have enough cushioning.  If you have foot problems, report any cuts, sores, or bruises to your health care provider immediately.  Schedule a complete foot exam at least once a year (annually) or more often if you have foot problems. This information is not intended to replace advice given to you by your health care provider. Make sure you discuss any questions you have with your health care provider. Document Released: 09/23/2000 Document Revised: 11/08/2017 Document Reviewed: 10/28/2016 Elsevier Interactive Patient Education  2019 [employer]  Diabetic Neuropathy Diabetic neuropathy refers to nerve damage that is caused by diabetes (diabetes mellitus). Over time, people with diabetes can develop nerve damage throughout the body. There are several types of diabetic neuropathy:  Peripheral neuropathy. This is the most common type of diabetic neuropathy. It causes damage to nerves that carry signals between the spinal cord and other parts of the body (peripheral nerves). This usually  affects nerves in the feet and legs first, and may eventually affect the hands and arms. The damage affects the ability to sense touch or temperature.  Autonomic neuropathy. This type causes damage to nerves that control involuntary functions (autonomic nerves). These nerves carry signals that control: ? Heartbeat. ? Body temperature. ? Blood pressure. ? Urination. ? Digestion. ? Sweating. ? Sexual function. ? Response to changing blood sugar (glucose) levels.  Focal neuropathy. This type of nerve damage affects one area of the body, such as an arm, a leg, or the face. The injury may involve one nerve or a small group of nerves. Focal neuropathy can be painful and unpredictable, and occurs most often in older adults with diabetes. This often develops suddenly, but usually improves over time and does not cause long-term problems.  Proximal neuropathy. This type of nerve damage affects the nerves of the thighs, hips, buttocks, or legs. It causes severe pain, weakness, and muscle death (atrophy), usually in the thigh muscles. It is more common among older men and people who have type 2 diabetes. The length of recovery time may vary. What are the causes? Peripheral, autonomic, and focal neuropathies are caused by diabetes that is not well controlled with treatment. The cause of proximal neuropathy is not known, but it may be caused by inflammation related to uncontrolled blood glucose levels. What are the signs or symptoms? Peripheral neuropathy Peripheral neuropathy develops slowly over time. When the nerves of the feet and legs no longer work, you may experience:  Burning, stabbing, or aching pain in the legs or feet.  Pain or cramping in the legs or feet.  Loss of feeling (numbness) and inability to feel pressure or pain in the feet. This can lead to: ? Thick calluses or sores on areas of constant pressure. ? Ulcers. ? Reduced ability to feel temperature changes.  Foot  deformities.  Muscle weakness.  Loss of balance or coordination. Autonomic neuropathy The symptoms of autonomic neuropathy vary depending on which nerves are affected. Symptoms may include:  Problems with digestion, such as: ? Nausea or vomiting. ? Poor appetite. ? Bloating. ? Diarrhea or constipation. ? Trouble swallowing. ? Losing weight without trying to.  Problems with the heart, blood and lungs, such as: ?   Dizziness, especially when standing up. ? Fainting. ? Shortness of breath. ? Irregular heartbeat.  Bladder problems, such as: ? Trouble starting or stopping urination. ? Leaking urine. ? Trouble emptying the bladder. ? Urinary tract infections (UTIs).  Problems with other body functions, such as: ? Sweat. You may sweat too much or too little. ? Temperature. You might get hot easily. Or, you might feel cold more than usual. ? Sexual function. Men may not be able to get or maintain an erection. Women may have vaginal dryness and difficulty with arousal. Focal neuropathy Symptoms affect only one area of the body. Common symptoms include:  Numbness.  Tingling.  Burning pain.  Prickling feeling.  Very sensitive skin.  Weakness.  Inability to move (paralysis).  Muscle twitching.  Muscles getting smaller (wasting).  Poor coordination.  Double or blurred vision. Proximal neuropathy  Sudden, severe pain in the hip, thigh, or buttocks. Pain may spread from the back into the legs (sciatica).  Pain and numbness in the arms and legs.  Tingling.  Loss of bladder control or bowel control.  Weakness and wasting of thigh muscles.  Difficulty getting up from a seated position.  Abdominal swelling.  Unexplained weight loss. How is this diagnosed? Diagnosis usually involves reviewing your medical history and any symptoms you have. Diagnosis varies depending on the type of neuropathy your health care provider suspects. Peripheral neuropathy Your health  care provider will check areas that are affected by your nervous system (neurologic exam), such as your reflexes, how you move, and what you can feel. You may have other tests, such as:  Blood tests.  Removal and examination of fluid that surrounds the spinal cord (lumbar puncture).  CT scan.  MRI.  A test to check the nerves that control muscles (electromyogram, EMG).  Tests of how quickly messages pass through your nerves (nerve conduction velocity tests).  Removal of a small piece of nerve to be examined under a microscope (biopsy). Autonomic neuropathy You may have tests, such as:  Tests to measure your blood pressure and heart rate. This may include monitoring you while you are safely secured to an exam table that moves you from a lying position to an upright position (table tilt test).  Breathing tests to check your lungs.  Tests to check how food moves through the digestive system (gastric emptying tests).  Blood, sweat, or urine tests.  Ultrasound of your bladder.  Spinal fluid tests. Focal neuropathy This condition may be diagnosed with:  A neurologic exam.  CT scan.  MRI.  EMG.  Nerve conduction velocity tests. Proximal neuropathy There is no test to diagnose this type of neuropathy. You may have tests to rule out other possible causes of this type of neuropathy. Tests may include:  X-rays of your spine and lumbar region.  Lumbar puncture.  MRI. How is this treated? The goal of treatment is to keep nerve damage from getting worse. The most important part of treatment is keeping your blood glucose level and your A1C level within your target range by following your diabetes management plan. Over time, maintaining lower blood glucose levels helps lessen symptoms. In some cases, you may need prescription pain medicine. Follow these instructions at home:  Lifestyle   Do not use any products that contain nicotine or tobacco, such as cigarettes and  e-cigarettes. If you need help quitting, ask your health care provider.  Be physically active every day. Include strength training and balance exercises.  Follow a healthy meal plan.    Work with your health care provider to manage your blood pressure. General instructions  Follow your diabetes management plan as directed. ? Check your blood glucose levels as directed by your health care provider. ? Keep your blood glucose in your target range as directed by your health care provider. ? Have your A1C level checked at least two times a year, or as often as told by your health care provider.  Take over the counter and prescription medicines only as told by your health care provider. This includes insulin and diabetes medicine.  Do not drive or use heavy machinery while taking prescription pain medicines.  Check your skin and feet every day for cuts, bruises, redness, blisters, or sores.  Keep all follow up visits as told by your health care provider. This is important. Contact a health care provider if:  You have burning, stabbing, or aching pain in your legs or feet.  You are unable to feel pressure or pain in your feet.  You develop problems with digestion, such as: ? Nausea. ? Vomiting. ? Bloating. ? Constipation. ? Diarrhea. ? Abdominal pain.  You have difficulty with urination, such as inability: ? To control when you urinate (incontinence). ? To completely empty the bladder (retention).  You have palpitations.  You feel dizzy, weak, or faint when you stand up. Get help right away if:  You cannot urinate.  You have sudden weakness or loss of coordination.  You have trouble speaking.  You have pain or pressure in your chest.  You have an irregular heart beat.  You have sudden inability to move a part of your body. Summary  Diabetic neuropathy refers to nerve damage that is caused by diabetes. It can affect nerves throughout the entire body, causing numbness  and pain in the arms, legs, digestive tract, heart, and other body systems.  Keep your blood glucose level and your blood pressure in your target range, as directed by your health care provider. This can help prevent neuropathy from getting worse.  Check your skin and feet every day for cuts, bruises, redness, blisters, or sores.  Do not use any products that contain nicotine or tobacco, such as cigarettes and e-cigarettes. If you need help quitting, ask your health care provider. This information is not intended to replace advice given to you by your health care provider. Make sure you discuss any questions you have with your health care provider. Document Released: 12/05/2001 Document Revised: 11/08/2017 Document Reviewed: 10/31/2016 Elsevier Interactive Patient Education  2019 [employer]  

## 2018-11-07 NOTE — Progress Notes (Signed)
Subjective: Darrell Horne presents today for a risk foot care.  He has history of below-knee amputation of the left lower extremity.  He presents with painful, thick toenails 1-5 b/l that he cannot cut and which interfere with daily activities.  Pain is aggravated when wearing enclosed shoe gear.  Ngetich, Nelda Bucks, NP is his PCP.  He continues to take the blood thinner Eliquis.   Current Outpatient Medications:  .  amitriptyline (ELAVIL) 50 MG tablet, Take 1 tablet (50 mg total) by mouth at bedtime., Disp: 90 tablet, Rfl: 3 .  apixaban (ELIQUIS) 5 MG TABS tablet, Take 1 tablet (5 mg total) by mouth 2 (two) times daily., Disp: 180 tablet, Rfl: 1 .  atorvastatin (LIPITOR) 40 MG tablet, Take one tablet by mouth once daily (Patient taking differently: Take 40 mg by mouth daily. ), Disp: 90 tablet, Rfl: 1 .  b complex vitamins tablet, Take 1 tablet by mouth daily., Disp: , Rfl:  .  cyclobenzaprine (FLEXERIL) 10 MG tablet, Take 1 tablet (10 mg total) by mouth 2 (two) times daily as needed for muscle spasms., Disp: 8 tablet, Rfl: 0 .  gabapentin (NEURONTIN) 600 MG tablet, Take 1 tablet (600 mg total) by mouth 3 (three) times daily., Disp: 270 tablet, Rfl: 3 .  Menthol (ICY HOT) 5 % PTCH, Apply 1 patch topically as needed (back pain)., Disp: , Rfl:  .  metFORMIN (GLUCOPHAGE-XR) 500 MG 24 hr tablet, Take two tablets by mouth daily with supper (Patient taking differently: Take 1,000 mg by mouth daily with supper. ), Disp: 180 tablet, Rfl: 1 .  metoprolol succinate (TOPROL-XL) 50 MG 24 hr tablet, Take 1 tablet (50 mg total) by mouth daily with supper. Take with or immediately following a meal., Disp: 90 tablet, Rfl: 1 .  Polyethyl Glycol-Propyl Glycol (SYSTANE OP), Place 1 drop into both eyes 2 (two) times daily as needed (dry eyes/ burning)., Disp: , Rfl:  .  topiramate (TOPAMAX) 50 MG tablet, Take 1.5 tablets (75 mg total) by mouth daily., Disp: 135 tablet, Rfl: 3 .  vitamin C (ASCORBIC ACID) 500 MG  tablet, Take 500 mg by mouth 2 (two) times daily. , Disp: , Rfl:   No Known Allergies  Objective:  Vascular Examination: Capillary refill time less than 3 seconds x 5 digits right lower extremity  Dorsalis pedis and Posterior tibial pulses palpable right lower extremity  Digital hair present x 5 digits   Skin temperature gradient WNL right lower extremity  Dermatological Examination: Skin with normal turgor, texture and tone right lower extremity  Toenails 1-5 right foot discolored, thick, dystrophic with subungual debris and pain with palpation to nailbeds due to thickness of nails.  Musculoskeletal: Muscle strength 5/5 to all LE muscle groups right lower extremity No gross bony deformities b/l.  No pain, crepitus or joint limitation noted with ROM right lower extremity  Neurological: Sensation intact with 10 gram monofilament. Vibratory sensation intact.  Assessment: Painful onychomycosis toenails 1-5 right foot  Plan: 1. Toenails 1-5 right foot were debrided in length and girth without iatrogenic bleeding. 2. Patient to continue soft, supportive shoe gear daily. 3. Patient to report any pedal injuries to medical professional immediately. 4. Follow up 3 months. Patient/POA to call should there be a concern in the interim.

## 2018-11-09 ENCOUNTER — Ambulatory Visit: Payer: Medicare HMO | Admitting: Podiatry

## 2018-11-14 ENCOUNTER — Other Ambulatory Visit: Payer: Self-pay | Admitting: *Deleted

## 2018-11-14 DIAGNOSIS — E1142 Type 2 diabetes mellitus with diabetic polyneuropathy: Secondary | ICD-10-CM

## 2018-11-14 DIAGNOSIS — R69 Illness, unspecified: Secondary | ICD-10-CM | POA: Diagnosis not present

## 2018-11-14 MED ORDER — GLUCOSE BLOOD VI STRP: ORAL_STRIP | 3 refills | Status: DC

## 2018-11-14 NOTE — Telephone Encounter (Signed)
Patient requested refill

## 2018-11-20 ENCOUNTER — Telehealth (INDEPENDENT_AMBULATORY_CARE_PROVIDER_SITE_OTHER): Payer: Self-pay | Admitting: Family Medicine

## 2018-11-20 ENCOUNTER — Other Ambulatory Visit: Payer: Self-pay | Admitting: *Deleted

## 2018-11-20 MED ORDER — OMEPRAZOLE 20 MG PO CPDR: 20.0000 mg | DELAYED_RELEASE_CAPSULE | Freq: Every day | ORAL | 1 refills | Status: DC

## 2018-11-20 MED ORDER — ATORVASTATIN CALCIUM 40 MG PO TABS: ORAL_TABLET | ORAL | 1 refills | Status: DC

## 2018-11-20 NOTE — Telephone Encounter (Signed)
[facility] 

## 2018-11-20 NOTE — Telephone Encounter (Signed)
Darrell Horne, wife requested refill. Patient is currently taking medication and took last tablet today.

## 2018-11-20 NOTE — Telephone Encounter (Signed)
Patient called her husband lost the name and number to place for therapy on his back

## 2018-11-21 NOTE — Telephone Encounter (Signed)
I called and spoke with wife, [facility] [phone]. She wrote it down and read it back to me.

## 2018-11-27 ENCOUNTER — Encounter: Payer: Self-pay | Admitting: Family

## 2018-11-28 ENCOUNTER — Other Ambulatory Visit: Payer: Self-pay | Admitting: *Deleted

## 2018-11-28 DIAGNOSIS — G4700 Insomnia, unspecified: Secondary | ICD-10-CM

## 2018-11-28 DIAGNOSIS — F329 Major depressive disorder, single episode, unspecified: Secondary | ICD-10-CM

## 2018-11-28 DIAGNOSIS — R519 Headache, unspecified: Secondary | ICD-10-CM

## 2018-11-28 DIAGNOSIS — F32A Depression, unspecified: Secondary | ICD-10-CM

## 2018-11-28 DIAGNOSIS — G8929 Other chronic pain: Secondary | ICD-10-CM

## 2018-11-28 DIAGNOSIS — R51 Headache: Secondary | ICD-10-CM

## 2018-11-28 MED ORDER — AMITRIPTYLINE HCL 50 MG PO TABS: 50.0000 mg | ORAL_TABLET | Freq: Every day | ORAL | 1 refills | Status: DC

## 2018-11-28 NOTE — Telephone Encounter (Signed)
Patient requested refill

## 2018-12-15 ENCOUNTER — Other Ambulatory Visit: Payer: Self-pay | Admitting: Family

## 2018-12-18 DIAGNOSIS — M545 Low back pain: Secondary | ICD-10-CM | POA: Diagnosis not present

## 2019-01-15 ENCOUNTER — Other Ambulatory Visit: Payer: Self-pay | Admitting: *Deleted

## 2019-01-15 DIAGNOSIS — E1142 Type 2 diabetes mellitus with diabetic polyneuropathy: Secondary | ICD-10-CM

## 2019-01-15 MED ORDER — GABAPENTIN 600 MG PO TABS: 600.0000 mg | ORAL_TABLET | Freq: Three times a day (TID) | ORAL | 1 refills | Status: DC

## 2019-01-15 NOTE — Telephone Encounter (Signed)
Patient requested refill

## 2019-01-23 ENCOUNTER — Other Ambulatory Visit: Payer: Self-pay | Admitting: *Deleted

## 2019-01-23 MED ORDER — METFORMIN HCL ER 500 MG PO TB24: ORAL_TABLET | ORAL | 1 refills | Status: DC

## 2019-01-23 NOTE — Telephone Encounter (Signed)
[facility] 

## 2019-01-24 DIAGNOSIS — R69 Illness, unspecified: Secondary | ICD-10-CM | POA: Diagnosis not present

## 2019-01-28 ENCOUNTER — Ambulatory Visit: Payer: Self-pay

## 2019-01-28 ENCOUNTER — Encounter: Payer: Self-pay | Admitting: Family

## 2019-01-29 ENCOUNTER — Other Ambulatory Visit: Payer: Self-pay

## 2019-01-29 ENCOUNTER — Ambulatory Visit (INDEPENDENT_AMBULATORY_CARE_PROVIDER_SITE_OTHER): Payer: Medicare HMO | Admitting: Family

## 2019-01-29 VITALS — Wt 260.0 lb

## 2019-01-29 DIAGNOSIS — Z0000 Encounter for general adult medical examination without abnormal findings: Secondary | ICD-10-CM

## 2019-01-29 MED ORDER — TETANUS-DIPHTH-ACELL PERTUSSIS 5-2-15.5 LF-MCG/0.5 IM SUSP: 0.5000 mL | Freq: Once | INTRAMUSCULAR | 0 refills | Status: AC

## 2019-01-29 NOTE — Progress Notes (Signed)
   This service is provided via telemedicine  No vital signs collected/recorded due to the encounter was a telemedicine visit.   Location of patient (ex: home, work):  Home  Patient consents to a telephone visit:  yes  Location of the provider (ex: office, home):  Office    Names of all persons participating in the telemedicine service and their role in the encounter:  Ruthell Rummage CMA, Dinah Ngetich NP, and Tawni Pummel   Time spent on call:  Ruthell Rummage CMA spent 11   Minutes with patient on phone

## 2019-01-29 NOTE — Patient Instructions (Signed)
Darrell Horne , Thank you for taking time to come for your Medicare Wellness Visit. I appreciate your ongoing commitment to your health goals. Please review the following plan we discussed and let me know if I can assist you in the future.   Screening recommendations/referrals: Colonoscopy Up to date  Recommended yearly ophthalmology/optometry visit for glaucoma screening and checkup Recommended yearly dental visit for hygiene and checkup  Vaccinations: Influenza vaccine: Up to date  Pneumococcal vaccine: Up to date  Tdap vaccine: Ordered this visit  Shingles vaccine: Declined   Advanced directives: Will provide copy   Conditions/risks identified: Type 2 DM,Hyperlipidemia,Hypertension,Male Gender,Advance age men >55 years   Next appointment: 1 year   Preventive Care 74 Years and Older, Male Preventive care refers to lifestyle choices and visits with your health care provider that can promote health and wellness. What does preventive care include?  A yearly physical exam. This is also called an annual well check.  Dental exams once or twice a year.  Routine eye exams. Ask your health care provider how often you should have your eyes checked.  Personal lifestyle choices, including:  Daily care of your teeth and gums.  Regular physical activity.  Eating a healthy diet.  Avoiding tobacco and drug use.  Limiting alcohol use.  Practicing safe sex.  Taking low doses of aspirin every day.  Taking vitamin and mineral supplements as recommended by your health care provider. What happens during an annual well check? The services and screenings done by your health care provider during your annual well check will depend on your age, overall health, lifestyle risk factors, and family history of disease. Counseling  Your health care provider may ask you questions about your:  Alcohol use.  Tobacco use.  Drug use.  Emotional well-being.  Home and relationship well-being.   Sexual activity.  Eating habits.  History of falls.  Memory and ability to understand (cognition).  Work and work Statistician. Screening  You may have the following tests or measurements:  Height, weight, and BMI.  Blood pressure.  Lipid and cholesterol levels. These may be checked every 5 years, or more frequently if you are over 71 years old.  Skin check.  Lung cancer screening. You may have this screening every year starting at age 74 if you have a 30-pack-year history of smoking and currently smoke or have quit within the past 15 years.  Fecal occult blood test (FOBT) of the stool. You may have this test every year starting at age 74.  Flexible sigmoidoscopy or colonoscopy. You may have a sigmoidoscopy every 5 years or a colonoscopy every 10 years starting at age 74.  Prostate cancer screening. Recommendations will vary depending on your family history and other risks.  Hepatitis C blood test.  Hepatitis B blood test.  Sexually transmitted disease (STD) testing.  Diabetes screening. This is done by checking your blood sugar (glucose) after you have not eaten for a while (fasting). You may have this done every 1-3 years.  Abdominal aortic aneurysm (AAA) screening. You may need this if you are a current or former smoker.  Osteoporosis. You may be screened starting at age 74 if you are at high risk. Talk with your health care provider about your test results, treatment options, and if necessary, the need for more tests. Vaccines  Your health care provider may recommend certain vaccines, such as:  Influenza vaccine. This is recommended every year.  Tetanus, diphtheria, and acellular pertussis (Tdap, Td) vaccine. You may need  a Td booster every 10 years.  Zoster vaccine. You may need this after age 74.  Pneumococcal 13-valent conjugate (PCV13) vaccine. One dose is recommended after age 56.  Pneumococcal polysaccharide (PPSV23) vaccine. One dose is recommended after  age 74. Talk to your health care provider about which screenings and vaccines you need and how often you need them. This information is not intended to replace advice given to you by your health care provider. Make sure you discuss any questions you have with your health care provider. Document Released: 10/23/2015 Document Revised: 06/15/2016 Document Reviewed: 07/28/2015 Elsevier Interactive Patient Education  2017 [employer] Prevention in the Home Falls can cause injuries. They can happen to people of all ages. There are many things you can do to make your home safe and to help prevent falls. What can I do on the outside of my home?  Regularly fix the edges of walkways and driveways and fix any cracks.  Remove anything that might make you trip as you walk through a door, such as a raised step or threshold.  Trim any bushes or trees on the path to your home.  Use bright outdoor lighting.  Clear any walking paths of anything that might make someone trip, such as rocks or tools.  Regularly check to see if handrails are loose or broken. Make sure that both sides of any steps have handrails.  Any raised decks and porches should have guardrails on the edges.  Have any leaves, snow, or ice cleared regularly.  Use sand or salt on walking paths during winter.  Clean up any spills in your garage right away. This includes oil or grease spills. What can I do in the bathroom?  Use night lights.  Install grab bars by the toilet and in the tub and shower. Do not use towel bars as grab bars.  Use non-skid mats or decals in the tub or shower.  If you need to sit down in the shower, use a plastic, non-slip stool.  Keep the floor dry. Clean up any water that spills on the floor as soon as it happens.  Remove soap buildup in the tub or shower regularly.  Attach bath mats securely with double-sided non-slip rug tape.  Do not have throw rugs and other things on the floor that can  make you trip. What can I do in the bedroom?  Use night lights.  Make sure that you have a light by your bed that is easy to reach.  Do not use any sheets or blankets that are too big for your bed. They should not hang down onto the floor.  Have a firm chair that has side arms. You can use this for support while you get dressed.  Do not have throw rugs and other things on the floor that can make you trip. What can I do in the kitchen?  Clean up any spills right away.  Avoid walking on wet floors.  Keep items that you use a lot in easy-to-reach places.  If you need to reach something above you, use a strong step stool that has a grab bar.  Keep electrical cords out of the way.  Do not use floor polish or wax that makes floors slippery. If you must use wax, use non-skid floor wax.  Do not have throw rugs and other things on the floor that can make you trip. What can I do with my stairs?  Do not leave any items on the  stairs.  Make sure that there are handrails on both sides of the stairs and use them. Fix handrails that are broken or loose. Make sure that handrails are as long as the stairways.  Check any carpeting to make sure that it is firmly attached to the stairs. Fix any carpet that is loose or worn.  Avoid having throw rugs at the top or bottom of the stairs. If you do have throw rugs, attach them to the floor with carpet tape.  Make sure that you have a light switch at the top of the stairs and the bottom of the stairs. If you do not have them, ask someone to add them for you. What else can I do to help prevent falls?  Wear shoes that:  Do not have high heels.  Have rubber bottoms.  Are comfortable and fit you well.  Are closed at the toe. Do not wear sandals.  If you use a stepladder:  Make sure that it is fully opened. Do not climb a closed stepladder.  Make sure that both sides of the stepladder are locked into place.  Ask someone to hold it for you,  if possible.  Clearly mark and make sure that you can see:  Any grab bars or handrails.  First and last steps.  Where the edge of each step is.  Use tools that help you move around (mobility aids) if they are needed. These include:  Canes.  Walkers.  Scooters.  Crutches.  Turn on the lights when you go into a dark area. Replace any light bulbs as soon as they burn out.  Set up your furniture so you have a clear path. Avoid moving your furniture around.  If any of your floors are uneven, fix them.  If there are any pets around you, be aware of where they are.  Review your medicines with your doctor. Some medicines can make you feel dizzy. This can increase your chance of falling. Ask your doctor what other things that you can do to help prevent falls. This information is not intended to replace advice given to you by your health care provider. Make sure you discuss any questions you have with your health care provider. Document Released: 07/23/2009 Document Revised: 03/03/2016 Document Reviewed: 10/31/2014 Elsevier Interactive Patient Education  2017 [employer].

## 2019-01-29 NOTE — Progress Notes (Signed)
Subjective:   Darrell Horne is a 73 y.o. male who presents for Medicare Annual/Subsequent preventive examination.  Review of Systems:  Cardiac Risk Factors include: diabetes mellitus;dyslipidemia;hypertension;male gender;sedentary lifestyle;advanced age (>36men, >33 women)     Objective:    Vitals: Wt 260 lb (117.9 kg)   BMI 37.31 kg/m   Body mass index is 37.31 kg/m.  Advanced Directives 01/29/2019 11/06/2018 10/29/2018 10/25/2018 08/22/2018 01/24/2018 01/24/2018  Does Patient Have a Medical Advance Directive? Yes Yes Yes Yes Yes Yes Yes  Type of Advance Directive Living will [place];Living will Healthcare Power of [place];Living will Living will Healthcare Power of [place]  Does patient want to make changes to medical advance directive? - - No - Patient declined - - - No - Patient declined  Copy of [place] in Chart? - No - copy requested Yes - validated most recent copy scanned in chart (See row information) - - Yes Yes  Would patient like information on creating a medical advance directive? - - - - - - -    Tobacco Social History   Tobacco Use  Smoking Status Former Smoker  . Years: 12.00  . Last attempt to quit: 10/10/1981  . Years since quitting: 37.3  Smokeless Tobacco Never Used     Counseling given: Not Answered   Clinical Intake:  Pre-visit preparation completed: No  Pain : No/denies pain     Nutritional Risks: None Diabetes: Yes CBG done?: No Did pt. bring in CBG monitor from home?: No(no log )  How often do you need to have someone help you when you read instructions, pamphlets, or other written materials from your doctor or pharmacy?: 1 - Never What is the last grade level you completed in school?: 12 grade  Interpreter Needed?: No  Information entered by :: dinah Ngetich FNP-C   Past Medical History:  Diagnosis Date  . Chest pain   . Diabetes  mellitus   . DM2 (diabetes mellitus, type 2) ([place])   . GERD (gastroesophageal reflux disease)   . HLD (hyperlipidemia)   . HTN (hypertension)   . PAF (paroxysmal atrial fibrillation) ([place])   . Palpitations    Past Surgical History:  Procedure Laterality Date  . APPENDECTOMY    . CHOLECYSTECTOMY    . COLONOSCOPY     Dr. Michail Sermon  . COLONOSCOPY WITH PROPOFOL N/A 11/06/2018   Procedure: COLONOSCOPY WITH PROPOFOL;  Surgeon: Wilford Corner, MD;  Location: [facility];  Service: Endoscopy;  Laterality: N/A;  . HERNIA REPAIR    . LEG AMPUTATION Left [id number]   Dr. Kendell Bane  . POLYPECTOMY  11/06/2018   Procedure: POLYPECTOMY;  Surgeon: Wilford Corner, MD;  Location: [facility];  Service: Endoscopy;;  . SHOULDER SURGERY  [id number]   Dr. Meridee Score  . TOTAL ANKLE ARTHROPLASTY  [id number]   Family History  Problem Relation Age of Onset  . Heart disease Mother        Living  . Stroke Father 50       Deceased  . Diabetes Sister   . Cancer Sister    Social History   Socioeconomic History  . Marital status: Married    Spouse name: Not on file  . Number of children: 4  . Years of education: Not on file  . Highest education level: Not on file  Occupational History  . Occupation: retired Investment banker, operational  . Financial resource strain:  Not hard at all  . Food insecurity:    Worry: Never true    Inability: Never true  . Transportation needs:    Medical: No    Non-medical: No  Tobacco Use  . Smoking status: Former Smoker    Years: 12.00    Last attempt to quit: 10/10/1981    Years since quitting: 37.3  . Smokeless tobacco: Never Used  Substance and Sexual Activity  . Alcohol use: No    Comment: Previously drinking a fifth of liquor every two days x 13 years  . Drug use: No  . Sexual activity: Never  Lifestyle  . Physical activity:    Days per week: 0 days    Minutes per session: 0 min  . Stress: To some extent  Relationships  . Social connections:     Talks on phone: Once a week    Gets together: Once a week    Attends religious service: More than 4 times per year    Active member of club or organization: No    Attends meetings of clubs or organizations: Never    Relationship status: Divorced  Other Topics Concern  . Not on file  Social History Narrative   Diet:   Do you drink/eat things with caffeine?  Yes   Marital status:  Divorced.  What year were you married?  2001   Do you live in a house, assisted living, condo, apartment, trailer, etc.?  Apartment   Is it one or more stories?  1 story   How many persons live in your home?  1   Do you have any pets in your home?  No   Current or past profession:  Truck driver   Do you exercise?  Some  Type and how often:  Walking    Outpatient Encounter Medications as of 01/29/2019  Medication Sig  . amitriptyline (ELAVIL) 50 MG tablet Take 1 tablet (50 mg total) by mouth at bedtime.  Marland Kitchen apixaban (ELIQUIS) 5 MG TABS tablet Take 1 tablet (5 mg total) by mouth 2 (two) times daily.  Marland Kitchen atorvastatin (LIPITOR) 40 MG tablet Take one tablet by mouth once daily  . b complex vitamins tablet Take 1 tablet by mouth daily.  . cyclobenzaprine (FLEXERIL) 10 MG tablet Take 1 tablet (10 mg total) by mouth 2 (two) times daily as needed for muscle spasms.  Marland Kitchen gabapentin (NEURONTIN) 600 MG tablet Take 1 tablet (600 mg total) by mouth 3 (three) times daily.  Marland Kitchen glucose blood (ONETOUCH VERIO) test strip Use to test blood sugar twice daily Dx: F02.63  . Menthol (ICY HOT) 5 % PTCH Apply 1 patch topically as needed (back pain).  . metFORMIN (GLUCOPHAGE-XR) 500 MG 24 hr tablet Take two tablets by mouth daily with supper  . metoprolol succinate (TOPROL-XL) 50 MG 24 hr tablet Take 1 tablet (50 mg total) by mouth daily with supper. Take with or immediately following a meal.  . omeprazole (PRILOSEC) 20 MG capsule TAKE 1 CAPSULE BY MOUTH EVERY DAY  . Polyethyl Glycol-Propyl Glycol (SYSTANE OP) Place 1 drop into both eyes 2  (two) times daily as needed (dry eyes/ burning).  . Tdap (ADACEL) 02-08-14.5 LF-MCG/0.5 injection Inject 0.5 mLs into the muscle once.  . topiramate (TOPAMAX) 50 MG tablet Take 1.5 tablets (75 mg total) by mouth daily.  . vitamin C (ASCORBIC ACID) 500 MG tablet Take 500 mg by mouth 2 (two) times daily.    No facility-administered encounter medications on file as of  01/29/2019.     Activities of Daily Living In your present state of health, do you have any difficulty performing the following activities: 01/29/2019  Hearing? N  Vision? Y  Comment follows up with Opthalmology   Difficulty concentrating or making decisions? N  Walking or climbing stairs? N  Doing errands, shopping? N  Preparing Food and eating ? N  Using the Toilet? N  In the past six months, have you accidently leaked urine? N  Do you have problems with loss of bowel control? N  Managing your Medications? N  Managing your Finances? N  Housekeeping or managing your Housekeeping? N  Some recent data might be hidden    Patient Care Team: Ngetich, Nelda Bucks, NP as PCP - General (Family Medicine) Clent Jacks, MD as Consulting Physician (Ophthalmology)   Assessment:   This is a routine wellness examination for [place].  Exercise Activities and Dietary recommendations Current Exercise Habits: The patient does not participate in regular exercise at present, Exercise limited by: Other - see comments(back problems )  Goals    . Reduce sugar intake to X grams per day     Starting 11/11/16, I will attempt to decrease my sugar intake.        Fall Risk Fall Risk  01/29/2019 10/29/2018 01/24/2018 01/01/2018 06/30/2017  Falls in the past year? 0 0 Yes Yes No  Number falls in past yr: 0 0 1 1 -  Injury with Fall? 0 0 No No -  Comment - - - - -  Risk Factor Category  - - - - -  Risk for fall due to : - - - Impaired balance/gait;Impaired mobility -  Follow up - - - Falls evaluation completed;Education provided;Falls prevention  discussed -   Is the patient's home free of loose throw rugs in walkways, pet beds, electrical cords, etc?   no      Grab bars in the bathroom? yes      Handrails on the stairs?  N/A      Adequate lighting?   yes  Depression Screen PHQ 2/9 Scores 01/29/2019 01/24/2018 11/11/2016 12/09/2015  PHQ - 2 Score 0 2 1 0  PHQ- 9 Score - 5 - -    Cognitive Function MMSE - Mini Mental State Exam 01/24/2018 11/11/2016 12/09/2015  Not completed: - - (No Data)  Orientation to time 5 5 5   Orientation to Place 5 5 5   Registration 3 3 3   Attention/ Calculation 5 4 5   Recall 2 3 2   Language- name 2 objects 2 2 2   Language- repeat 1 1 1   Language- follow 3 step command 3 3 3   Language- read & follow direction 1 1 1   Write a sentence 1 1 1   Copy design 0 1 1  Total score 28 29 29      6CIT Screen 01/29/2019  What Year? 0 points  What month? 0 points  What time? 0 points  Count back from 20 0 points  Months in reverse 0 points  Repeat phrase 2 points  Total Score 2    Immunization History  Administered Date(s) Administered  . DTaP 10/10/2014  . Influenza,inj,Quad PF,6+ Mos 08/05/2016  . Influenza-Unspecified 10/10/2014  . Pneumococcal Conjugate-13 11/11/2016  . Pneumococcal-Unspecified 10/10/2014    Qualifies for Shingles Vaccine? Declined   Screening Tests Health Maintenance  Topic Date Due  . Hepatitis C Screening  08-Mar-1946  . TETANUS/TDAP  09/13/1964  . OPHTHALMOLOGY EXAM  10/18/2018  . FOOT EXAM  10/31/2018  .  HEMOGLOBIN A1C  04/29/2019  . INFLUENZA VACCINE  05/11/2019  . URINE MICROALBUMIN  10/30/2019  . COLONOSCOPY  11/06/2028  . PNA vac Low Risk Adult  Completed   Cancer Screenings: Lung: Low Dose CT Chest recommended if Age 36-80 years, 30 pack-year currently smoking OR have quit w/in 15years. Patient does not qualify. Colorectal: up to date   Additional Screenings:  Hepatitis C Screening: Low risk       Plan:    - Recommend increase physical activity/exercise three  times a week for at least 30 minutes.   I have personally reviewed and noted the following in the patient's chart:   . Medical and social history . Use of alcohol, tobacco or illicit drugs  . Current medications and supplements . Functional ability and status . Nutritional status . Physical activity . Advanced directives . List of other physicians . Hospitalizations, surgeries, and ER visits in previous 12 months . Vitals . Screenings to include cognitive, depression, and falls . Referrals and appointments  In addition, I have reviewed and discussed with patient certain preventive protocols, quality metrics, and best practice recommendations. A written personalized care plan for preventive services as well as general preventive health recommendations were provided to patient.  Sandrea Hughs, NP  01/29/2019

## 2019-02-04 ENCOUNTER — Encounter: Payer: Medicare HMO | Admitting: Nurse Practitioner

## 2019-02-04 ENCOUNTER — Telehealth: Payer: Self-pay | Admitting: Family

## 2019-02-04 ENCOUNTER — Other Ambulatory Visit: Payer: Self-pay

## 2019-02-04 ENCOUNTER — Telehealth: Payer: Self-pay

## 2019-02-04 NOTE — Telephone Encounter (Signed)
2:52p first attempt. Left a message will try again.  3:07p 2nd attempt on home and LMOM on mobile. Home-Wife says he doesn't have his hearing aids in to hear his cell, she will give him a try.  3:28p Last attempt on mobile unsuccessful. LMOM advising patient to call to reschedule.

## 2019-02-05 ENCOUNTER — Ambulatory Visit (INDEPENDENT_AMBULATORY_CARE_PROVIDER_SITE_OTHER): Payer: Medicare HMO | Admitting: Family

## 2019-02-05 ENCOUNTER — Encounter: Payer: Self-pay | Admitting: Family

## 2019-02-05 ENCOUNTER — Ambulatory Visit: Payer: Medicare HMO | Admitting: Podiatry

## 2019-02-05 ENCOUNTER — Other Ambulatory Visit: Payer: Self-pay

## 2019-02-05 DIAGNOSIS — R399 Unspecified symptoms and signs involving the genitourinary system: Secondary | ICD-10-CM

## 2019-02-05 NOTE — Progress Notes (Signed)
This encounter was created in error - please disregard.

## 2019-02-05 NOTE — Progress Notes (Signed)
This service is provided via telemedicine  No vital signs collected/recorded due to the encounter was a telemedicine visit.   Location of patient (ex: home, work):  Home  Patient consents to a telephone visit:  Yes  Location of the provider (ex: office, home):  Office   Names of all persons participating in the telemedicine service and their role in the encounter:  Ruthell Rummage CMA, Dinah Ngetich, Tawni Pummel  Time spent on call:  Ruthell Rummage CMA spent 5  Minutes with patient on phone     [facility]  Provider: Marlowe Sax, NP  Code Status: FULL Goals of Care:  Advanced Directives 01/29/2019  Does Patient Have a Medical Advance Directive? Yes  Type of Advance Directive Living will  Does patient want to make changes to medical advance directive? -  Copy of [place] in Chart? -  Would patient like information on creating a medical advance directive? -     No chief complaint on file.   HPI: Patient is a 73 y.o. male seen today for an acute visit for evaluation of worsening urine frequency and burning sensation with urination.He states symptoms have been going on for the past 5-6 weeks but has worsen in the past few days.He states goes up to 8 times during the day and at night but small amounts.he denies any fever,chills,lower abdominal pain or flank pain.He states drinks 2-3 glasses of water daily and sometime juice.he states does not drink enough fluid.   Past Medical History:  Diagnosis Date  . Chest pain   . Diabetes mellitus   . DM2 (diabetes mellitus, type 2) ([place])   . GERD (gastroesophageal reflux disease)   . HLD (hyperlipidemia)   . HTN (hypertension)   . PAF (paroxysmal atrial fibrillation) ([place])   . Palpitations     Past Surgical History:  Procedure Laterality Date  . APPENDECTOMY    . CHOLECYSTECTOMY    . COLONOSCOPY     Dr. Michail Sermon  . COLONOSCOPY WITH PROPOFOL N/A 11/06/2018   Procedure: COLONOSCOPY WITH PROPOFOL;  Surgeon:  Wilford Corner, MD;  Location: [facility];  Service: Endoscopy;  Laterality: N/A;  . HERNIA REPAIR    . LEG AMPUTATION Left [id number]   Dr. Kendell Bane  . POLYPECTOMY  11/06/2018   Procedure: POLYPECTOMY;  Surgeon: Wilford Corner, MD;  Location: [facility];  Service: Endoscopy;;  . SHOULDER SURGERY  [id number]   Dr. Meridee Score  . TOTAL ANKLE ARTHROPLASTY  [id number]    No Known Allergies  Outpatient Encounter Medications as of 02/05/2019  Medication Sig  . amitriptyline (ELAVIL) 50 MG tablet Take 1 tablet (50 mg total) by mouth at bedtime.  Marland Kitchen apixaban (ELIQUIS) 5 MG TABS tablet Take 1 tablet (5 mg total) by mouth 2 (two) times daily.  Marland Kitchen atorvastatin (LIPITOR) 40 MG tablet Take one tablet by mouth once daily  . b complex vitamins tablet Take 1 tablet by mouth daily.  . cyclobenzaprine (FLEXERIL) 10 MG tablet Take 1 tablet (10 mg total) by mouth 2 (two) times daily as needed for muscle spasms.  Marland Kitchen gabapentin (NEURONTIN) 600 MG tablet Take 1 tablet (600 mg total) by mouth 3 (three) times daily.  Marland Kitchen glucose blood (ONETOUCH VERIO) test strip Use to test blood sugar twice daily Dx: O37.85  . Menthol (ICY HOT) 5 % PTCH Apply 1 patch topically as needed (back pain).  . metFORMIN (GLUCOPHAGE-XR) 500 MG 24 hr tablet Take two tablets by mouth daily with supper  .  metoprolol succinate (TOPROL-XL) 50 MG 24 hr tablet Take 1 tablet (50 mg total) by mouth daily with supper. Take with or immediately following a meal.  . omeprazole (PRILOSEC) 20 MG capsule TAKE 1 CAPSULE BY MOUTH EVERY DAY  . Polyethyl Glycol-Propyl Glycol (SYSTANE OP) Place 1 drop into both eyes 2 (two) times daily as needed (dry eyes/ burning).  . topiramate (TOPAMAX) 50 MG tablet Take 1.5 tablets (75 mg total) by mouth daily.  . vitamin C (ASCORBIC ACID) 500 MG tablet Take 500 mg by mouth 2 (two) times daily.    No facility-administered encounter medications on file as of 02/05/2019.     Review of Systems:  Review of Systems   Constitutional: Negative for appetite change, chills, fatigue and fever.  Respiratory: Negative for cough, chest tightness, shortness of breath and wheezing.   Cardiovascular: Negative for chest pain, palpitations and leg swelling.  Gastrointestinal: Negative for abdominal distention, abdominal pain, constipation, diarrhea, nausea and vomiting.  Genitourinary: Positive for frequency. Negative for difficulty urinating, dysuria, flank pain, hematuria and urgency.       Worsening urine frequency and burning sensation.   Skin: Negative for color change, pallor and rash.  Neurological: Negative for dizziness, light-headedness and headaches.    Health Maintenance  Topic Date Due  . Hepatitis C Screening  May 07, 1946  . TETANUS/TDAP  09/13/1964  . OPHTHALMOLOGY EXAM  10/18/2018  . FOOT EXAM  10/31/2018  . HEMOGLOBIN A1C  04/29/2019  . INFLUENZA VACCINE  05/11/2019  . URINE MICROALBUMIN  10/30/2019  . COLONOSCOPY  11/06/2028  . PNA vac Low Risk Adult  Completed    Physical Exam: There were no vitals filed for this visit. There is no height or weight on file to calculate BMI. Physical Exam  Unable to complete on Telephone visit.   Labs reviewed: Basic Metabolic Panel: Recent Labs    04/15/18 1300 10/29/18 1117  NA 138 143  K 4.1 4.4  CL 109 111*  CO2 24 23  GLUCOSE 105* 112  BUN 8 9  CREATININE 0.90 0.79  CALCIUM 8.6* 9.0   Liver Function Tests: Recent Labs    10/29/18 1117  AST 19  ALT 19  BILITOT 0.5  PROT 6.5   CBC: Recent Labs    04/15/18 1300 10/29/18 1117  WBC 6.7 6.9  NEUTROABS 4.6 4,133  HGB 13.2 13.9  HCT 41.8 41.2  MCV 96.1 91.4  PLT 152 99*    Lab Results  Component Value Date   HGBA1C 6.0 (H) 10/29/2018    Procedures since last visit: No results found.  Assessment/Plan  Symptoms of urinary tract infection Afebrile.Has had worsening urine frequency and burning sensation. Encouraged to increase fluid intake. - urine specimen for U/A and  C/S to rule out UTI patient will declined to come in today to provide urine specimen but will come in 02/06/2019 to provider urine specimen.  - Notify provider if running any fever > 100.5   Labs/tests ordered: U/A and C/S to rule out UTI Next appt:  PRN  Time spent with patient 15 minutes >50% time spent counseling; reviewing medical record; tests; labs; and developing future plan of care

## 2019-02-06 ENCOUNTER — Other Ambulatory Visit: Payer: Self-pay

## 2019-02-06 ENCOUNTER — Other Ambulatory Visit: Payer: Medicare HMO

## 2019-02-06 DIAGNOSIS — R399 Unspecified symptoms and signs involving the genitourinary system: Secondary | ICD-10-CM | POA: Diagnosis not present

## 2019-02-08 LAB — URINE CULTURE
MICRO NUMBER:: 432537
SPECIMEN QUALITY:: ADEQUATE

## 2019-02-08 LAB — URINALYSIS, ROUTINE W REFLEX MICROSCOPIC
Bacteria, UA: NONE SEEN /HPF
Bilirubin Urine: NEGATIVE
Glucose, UA: NEGATIVE
Hgb urine dipstick: NEGATIVE
Hyaline Cast: NONE SEEN /LPF
Ketones, ur: NEGATIVE
Nitrite: NEGATIVE
Protein, ur: NEGATIVE
RBC / HPF: NONE SEEN /HPF (ref 0–2)
Specific Gravity, Urine: 1.007 (ref 1.001–1.03)
Squamous Epithelial / HPF: NONE SEEN /HPF (ref <=5)
pH: >=8.5 — AB (ref 5.0–8.0)

## 2019-02-11 ENCOUNTER — Other Ambulatory Visit: Payer: Self-pay

## 2019-02-11 MED ORDER — CIPROFLOXACIN HCL 500 MG PO TABS: 500.0000 mg | ORAL_TABLET | Freq: Two times a day (BID) | ORAL | 0 refills | Status: AC

## 2019-02-11 MED ORDER — SACCHAROMYCES BOULARDII 250 MG PO CAPS: 250.0000 mg | ORAL_CAPSULE | Freq: Two times a day (BID) | ORAL | 0 refills | Status: AC

## 2019-02-14 ENCOUNTER — Ambulatory Visit (INDEPENDENT_AMBULATORY_CARE_PROVIDER_SITE_OTHER): Payer: Medicare HMO | Admitting: Podiatry

## 2019-02-14 ENCOUNTER — Other Ambulatory Visit: Payer: Self-pay

## 2019-02-14 VITALS — Temp 98.8°F

## 2019-02-14 DIAGNOSIS — L97512 Non-pressure chronic ulcer of other part of right foot with fat layer exposed: Secondary | ICD-10-CM

## 2019-02-21 DIAGNOSIS — R69 Illness, unspecified: Secondary | ICD-10-CM | POA: Diagnosis not present

## 2019-02-22 ENCOUNTER — Other Ambulatory Visit: Payer: Self-pay

## 2019-02-22 ENCOUNTER — Ambulatory Visit: Payer: Medicare HMO | Admitting: Podiatry

## 2019-02-22 VITALS — Temp 98.1°F

## 2019-02-22 DIAGNOSIS — L97511 Non-pressure chronic ulcer of other part of right foot limited to breakdown of skin: Secondary | ICD-10-CM

## 2019-02-22 DIAGNOSIS — L97512 Non-pressure chronic ulcer of other part of right foot with fat layer exposed: Secondary | ICD-10-CM

## 2019-03-01 ENCOUNTER — Encounter: Payer: Self-pay | Admitting: Podiatry

## 2019-03-01 ENCOUNTER — Ambulatory Visit (INDEPENDENT_AMBULATORY_CARE_PROVIDER_SITE_OTHER): Payer: Medicare HMO | Admitting: Podiatry

## 2019-03-01 ENCOUNTER — Other Ambulatory Visit: Payer: Self-pay

## 2019-03-01 VITALS — Temp 97.7°F

## 2019-03-01 DIAGNOSIS — L97512 Non-pressure chronic ulcer of other part of right foot with fat layer exposed: Secondary | ICD-10-CM | POA: Diagnosis not present

## 2019-03-01 NOTE — Progress Notes (Signed)
Subjective:  Patient ID: Darrell Horne, male    DOB: 20-Feb-1946,  MRN: [id number]  Chief Complaint  Patient presents with  . Wound Check    RT 2nd toe: resolving/improving RT hallux medial side wound: draining    73 y.o. male presents for wound care. Thinks the wounds are doing better.  Review of Systems: Negative except as noted in the HPI. Denies N/V/F/Ch.  Past Medical History:  Diagnosis Date  . Chest pain   . Diabetes mellitus   . DM2 (diabetes mellitus, type 2) ([place])   . GERD (gastroesophageal reflux disease)   . HLD (hyperlipidemia)   . HTN (hypertension)   . PAF (paroxysmal atrial fibrillation) ([place])   . Palpitations     Current Outpatient Medications:  .  amitriptyline (ELAVIL) 50 MG tablet, Take 1 tablet (50 mg total) by mouth at bedtime., Disp: 90 tablet, Rfl: 1 .  apixaban (ELIQUIS) 5 MG TABS tablet, Take 1 tablet (5 mg total) by mouth 2 (two) times daily., Disp: 180 tablet, Rfl: 1 .  atorvastatin (LIPITOR) 40 MG tablet, Take one tablet by mouth once daily, Disp: 90 tablet, Rfl: 1 .  b complex vitamins tablet, Take 1 tablet by mouth daily., Disp: , Rfl:  .  cyclobenzaprine (FLEXERIL) 10 MG tablet, Take 1 tablet (10 mg total) by mouth 2 (two) times daily as needed for muscle spasms., Disp: 8 tablet, Rfl: 0 .  gabapentin (NEURONTIN) 600 MG tablet, Take 1 tablet (600 mg total) by mouth 3 (three) times daily., Disp: 270 tablet, Rfl: 1 .  glucose blood (ONETOUCH VERIO) test strip, Use to test blood sugar twice daily Dx: E11.42, Disp: 100 each, Rfl: 3 .  Menthol (ICY HOT) 5 % PTCH, Apply 1 patch topically as needed (back pain)., Disp: , Rfl:  .  metFORMIN (GLUCOPHAGE-XR) 500 MG 24 hr tablet, Take two tablets by mouth daily with supper, Disp: 180 tablet, Rfl: 1 .  metoprolol succinate (TOPROL-XL) 50 MG 24 hr tablet, Take 1 tablet (50 mg total) by mouth daily with supper. Take with or immediately following a meal., Disp: 90 tablet, Rfl: 1 .  omeprazole (PRILOSEC) 20 MG  capsule, TAKE 1 CAPSULE BY MOUTH EVERY DAY, Disp: 90 capsule, Rfl: 1 .  Polyethyl Glycol-Propyl Glycol (SYSTANE OP), Place 1 drop into both eyes 2 (two) times daily as needed (dry eyes/ burning)., Disp: , Rfl:  .  topiramate (TOPAMAX) 50 MG tablet, Take 1.5 tablets (75 mg total) by mouth daily., Disp: 135 tablet, Rfl: 3 .  vitamin C (ASCORBIC ACID) 500 MG tablet, Take 500 mg by mouth 2 (two) times daily. , Disp: , Rfl:   Social History   Tobacco Use  Smoking Status Former Smoker  . Years: 12.00  . Last attempt to quit: 10/10/1981  . Years since quitting: 37.4  Smokeless Tobacco Never Used    No Known Allergies Objective:   Vitals:   02/22/19 1327  Temp: 98.1 F (36.7 C)   There is no height or weight on file to calculate BMI. Constitutional Well developed. Well nourished.  Vascular Dorsalis pedis pulses palpable. Posterior tibial pulses palpable. Capillary refill normal to all digits.  No cyanosis or clubbing noted. Pedal hair growth normal.  Neurologic Normal speech. Oriented to person, place, and time. Protective sensation absent  Dermatologic Wounds right medial hallux with granular base, dorsal 2nd toe PIPJ. No acute signs of infection noted. Edema right forefoot. No signs of acute infection.   Orthopedic: No pain to palpation either foot.  Radiographs: None Assessment:   1. Ulcer of right second toe with fat layer exposed ([place])   2. Skin ulcer of right great toe, limited to breakdown of skin [facility])    Plan:  Patient was evaluated and treated and all questions answered.  Ulcer right hallux, right 2nd toe -Wounds improving. -Unna boot reapplied.  Return in about 1 week (around 03/01/2019) for wound check .

## 2019-03-01 NOTE — Progress Notes (Signed)
Subjective:  Patient ID: Darrell Horne, male    DOB: [birth year],  MRN: [id number]  Chief Complaint  Patient presents with  . Foot Ulcer    Follow up ulcer hallux and 2nd toe right    "I hope its looking better"    73 y.o. male presents for wound care.  History above confirmed with patient denies new issues  Review of Systems: Negative except as noted in the HPI. Denies N/V/F/Ch.  Past Medical History:  Diagnosis Date  . Chest pain   . Diabetes mellitus   . DM2 (diabetes mellitus, type 2) ([place])   . GERD (gastroesophageal reflux disease)   . HLD (hyperlipidemia)   . HTN (hypertension)   . PAF (paroxysmal atrial fibrillation) ([place])   . Palpitations     Current Outpatient Medications:  .  amitriptyline (ELAVIL) 50 MG tablet, Take 1 tablet (50 mg total) by mouth at bedtime., Disp: 90 tablet, Rfl: 1 .  apixaban (ELIQUIS) 5 MG TABS tablet, Take 1 tablet (5 mg total) by mouth 2 (two) times daily., Disp: 180 tablet, Rfl: 1 .  atorvastatin (LIPITOR) 40 MG tablet, Take one tablet by mouth once daily, Disp: 90 tablet, Rfl: 1 .  b complex vitamins tablet, Take 1 tablet by mouth daily., Disp: , Rfl:  .  cyclobenzaprine (FLEXERIL) 10 MG tablet, Take 1 tablet (10 mg total) by mouth 2 (two) times daily as needed for muscle spasms., Disp: 8 tablet, Rfl: 0 .  gabapentin (NEURONTIN) 600 MG tablet, Take 1 tablet (600 mg total) by mouth 3 (three) times daily., Disp: 270 tablet, Rfl: 1 .  glucose blood (ONETOUCH VERIO) test strip, Use to test blood sugar twice daily Dx: E11.42, Disp: 100 each, Rfl: 3 .  Menthol (ICY HOT) 5 % PTCH, Apply 1 patch topically as needed (back pain)., Disp: , Rfl:  .  metFORMIN (GLUCOPHAGE-XR) 500 MG 24 hr tablet, Take two tablets by mouth daily with supper, Disp: 180 tablet, Rfl: 1 .  metoprolol succinate (TOPROL-XL) 50 MG 24 hr tablet, Take 1 tablet (50 mg total) by mouth daily with supper. Take with or immediately following a meal., Disp: 90 tablet, Rfl: 1 .  omeprazole  (PRILOSEC) 20 MG capsule, TAKE 1 CAPSULE BY MOUTH EVERY DAY, Disp: 90 capsule, Rfl: 1 .  Polyethyl Glycol-Propyl Glycol (SYSTANE OP), Place 1 drop into both eyes 2 (two) times daily as needed (dry eyes/ burning)., Disp: , Rfl:  .  topiramate (TOPAMAX) 50 MG tablet, Take 1.5 tablets (75 mg total) by mouth daily., Disp: 135 tablet, Rfl: 3 .  vitamin C (ASCORBIC ACID) 500 MG tablet, Take 500 mg by mouth 2 (two) times daily. , Disp: , Rfl:   Social History   Tobacco Use  Smoking Status Former Smoker  . Years: 12.00  . Last attempt to quit: 10/10/1981  . Years since quitting: 37.4  Smokeless Tobacco Never Used    No Known Allergies Objective:   Vitals:   03/01/19 1102  Temp: 97.7 F (36.5 C)   There is no height or weight on file to calculate BMI. Constitutional Well developed. Well nourished.  Vascular Dorsalis pedis pulses palpable. Posterior tibial pulses palpable. Capillary refill normal to all digits.  No cyanosis or clubbing noted. Pedal hair growth normal.  Neurologic Normal speech. Oriented to person, place, and time. Protective sensation absent  Dermatologic Wounds epithelialized no signs of warmth erythema or acute infection   Orthopedic: No pain to palpation right foot.   Radiographs: None Assessment:  1. Ulcer of right second toe with fat layer exposed ([place])    Plan:  Patient was evaluated and treated and all questions answered.  Ulcer right hallux, right 2nd toe -Wounds appear epithelialized no dressing applied today patient to monitor closely and follow-up should recurrence be noted  No follow-ups on file.

## 2019-03-10 NOTE — Progress Notes (Signed)
Subjective:  Patient ID: Darrell Horne, male    DOB: January 05, 1946,  MRN: [id number]  Chief Complaint  Patient presents with  . Wound Check    Pt states right 1st toe had blood blister 4 months ago which became a constant wound that has not healed. Pt also states right 2nd toe wound has been on and off for 5 years. Pt denies fever/nausea/vomiting/chills.    73 y.o. male presents for wound care. Hx as above.  Review of Systems: Negative except as noted in the HPI. Denies N/V/F/Ch.  Past Medical History:  Diagnosis Date  . Chest pain   . Diabetes mellitus   . DM2 (diabetes mellitus, type 2) ([place])   . GERD (gastroesophageal reflux disease)   . HLD (hyperlipidemia)   . HTN (hypertension)   . PAF (paroxysmal atrial fibrillation) ([place])   . Palpitations     Current Outpatient Medications:  .  amitriptyline (ELAVIL) 50 MG tablet, Take 1 tablet (50 mg total) by mouth at bedtime., Disp: 90 tablet, Rfl: 1 .  apixaban (ELIQUIS) 5 MG TABS tablet, Take 1 tablet (5 mg total) by mouth 2 (two) times daily., Disp: 180 tablet, Rfl: 1 .  atorvastatin (LIPITOR) 40 MG tablet, Take one tablet by mouth once daily, Disp: 90 tablet, Rfl: 1 .  b complex vitamins tablet, Take 1 tablet by mouth daily., Disp: , Rfl:  .  cyclobenzaprine (FLEXERIL) 10 MG tablet, Take 1 tablet (10 mg total) by mouth 2 (two) times daily as needed for muscle spasms., Disp: 8 tablet, Rfl: 0 .  gabapentin (NEURONTIN) 600 MG tablet, Take 1 tablet (600 mg total) by mouth 3 (three) times daily., Disp: 270 tablet, Rfl: 1 .  glucose blood (ONETOUCH VERIO) test strip, Use to test blood sugar twice daily Dx: E11.42, Disp: 100 each, Rfl: 3 .  Menthol (ICY HOT) 5 % PTCH, Apply 1 patch topically as needed (back pain)., Disp: , Rfl:  .  metFORMIN (GLUCOPHAGE-XR) 500 MG 24 hr tablet, Take two tablets by mouth daily with supper, Disp: 180 tablet, Rfl: 1 .  metoprolol succinate (TOPROL-XL) 50 MG 24 hr tablet, Take 1 tablet (50 mg total) by mouth  daily with supper. Take with or immediately following a meal., Disp: 90 tablet, Rfl: 1 .  omeprazole (PRILOSEC) 20 MG capsule, TAKE 1 CAPSULE BY MOUTH EVERY DAY, Disp: 90 capsule, Rfl: 1 .  Polyethyl Glycol-Propyl Glycol (SYSTANE OP), Place 1 drop into both eyes 2 (two) times daily as needed (dry eyes/ burning)., Disp: , Rfl:  .  topiramate (TOPAMAX) 50 MG tablet, Take 1.5 tablets (75 mg total) by mouth daily., Disp: 135 tablet, Rfl: 3 .  vitamin C (ASCORBIC ACID) 500 MG tablet, Take 500 mg by mouth 2 (two) times daily. , Disp: , Rfl:   Social History   Tobacco Use  Smoking Status Former Smoker  . Years: 12.00  . Last attempt to quit: 10/10/1981  . Years since quitting: 37.4  Smokeless Tobacco Never Used    No Known Allergies Objective:   Vitals:   02/14/19 1514  Temp: 98.8 F (37.1 C)   There is no height or weight on file to calculate BMI. Constitutional Well developed. Well nourished.  Vascular Dorsalis pedis pulses palpable right. Posterior tibial pulses palpable right. Capillary refill normal to all digits.  No cyanosis or clubbing noted. Pedal hair growth normal.  Neurologic Normal speech. Oriented to person, place, and time. Protective sensation absent  Dermatologic Superficial abrasions of the right first toe  second toe no warmth erythema signs of acute infection  Orthopedic: No pain to palpation right Edema right forefoot History of BK left   Radiographs: None Assessment:   1. Ulcer of right second toe with fat layer exposed ([place])    Plan:  Patient was evaluated and treated and all questions answered.  Ulcer right first toe second toe -Ulcers dressed with Unna boot -We will have patient follow-up in 1 week for reapplication  Return in about 1 week (around 02/21/2019).

## 2019-03-13 ENCOUNTER — Other Ambulatory Visit: Payer: Self-pay | Admitting: *Deleted

## 2019-03-13 DIAGNOSIS — I480 Paroxysmal atrial fibrillation: Secondary | ICD-10-CM

## 2019-03-13 MED ORDER — APIXABAN 5 MG PO TABS: 5.0000 mg | ORAL_TABLET | Freq: Two times a day (BID) | ORAL | 1 refills | Status: DC

## 2019-03-13 NOTE — Telephone Encounter (Signed)
[facility] 

## 2019-03-24 DIAGNOSIS — R69 Illness, unspecified: Secondary | ICD-10-CM | POA: Diagnosis not present

## 2019-03-29 ENCOUNTER — Ambulatory Visit (INDEPENDENT_AMBULATORY_CARE_PROVIDER_SITE_OTHER): Payer: Medicare HMO | Admitting: Podiatry

## 2019-03-29 ENCOUNTER — Encounter: Payer: Self-pay | Admitting: Podiatry

## 2019-03-29 ENCOUNTER — Other Ambulatory Visit: Payer: Self-pay

## 2019-03-29 DIAGNOSIS — L97512 Non-pressure chronic ulcer of other part of right foot with fat layer exposed: Secondary | ICD-10-CM

## 2019-03-29 DIAGNOSIS — L97511 Non-pressure chronic ulcer of other part of right foot limited to breakdown of skin: Secondary | ICD-10-CM | POA: Diagnosis not present

## 2019-03-31 NOTE — Progress Notes (Signed)
Subjective:  Patient ID: Darrell Horne, male    DOB: [birth year],  MRN: [id number]  Chief Complaint  Patient presents with  . Foot Ulcer    Follow up ulcers hallux and 2nd toe right   "Some days they look good, some days they don't"    73 y.o. male presents for wound care.  History above confirmed with patient  Review of Systems: Negative except as noted in the HPI. Denies N/V/F/Ch.  Past Medical History:  Diagnosis Date  . Chest pain   . Diabetes mellitus   . DM2 (diabetes mellitus, type 2) ([place])   . GERD (gastroesophageal reflux disease)   . HLD (hyperlipidemia)   . HTN (hypertension)   . PAF (paroxysmal atrial fibrillation) ([place])   . Palpitations     Current Outpatient Medications:  .  amitriptyline (ELAVIL) 50 MG tablet, Take 1 tablet (50 mg total) by mouth at bedtime., Disp: 90 tablet, Rfl: 1 .  apixaban (ELIQUIS) 5 MG TABS tablet, Take 1 tablet (5 mg total) by mouth 2 (two) times daily., Disp: 180 tablet, Rfl: 1 .  atorvastatin (LIPITOR) 40 MG tablet, Take one tablet by mouth once daily, Disp: 90 tablet, Rfl: 1 .  b complex vitamins tablet, Take 1 tablet by mouth daily., Disp: , Rfl:  .  cyclobenzaprine (FLEXERIL) 10 MG tablet, Take 1 tablet (10 mg total) by mouth 2 (two) times daily as needed for muscle spasms., Disp: 8 tablet, Rfl: 0 .  gabapentin (NEURONTIN) 600 MG tablet, Take 1 tablet (600 mg total) by mouth 3 (three) times daily., Disp: 270 tablet, Rfl: 1 .  glucose blood (ONETOUCH VERIO) test strip, Use to test blood sugar twice daily Dx: E11.42, Disp: 100 each, Rfl: 3 .  Menthol (ICY HOT) 5 % PTCH, Apply 1 patch topically as needed (back pain)., Disp: , Rfl:  .  metFORMIN (GLUCOPHAGE-XR) 500 MG 24 hr tablet, Take two tablets by mouth daily with supper, Disp: 180 tablet, Rfl: 1 .  metoprolol succinate (TOPROL-XL) 50 MG 24 hr tablet, Take 1 tablet (50 mg total) by mouth daily with supper. Take with or immediately following a meal., Disp: 90 tablet, Rfl: 1 .  omeprazole  (PRILOSEC) 20 MG capsule, TAKE 1 CAPSULE BY MOUTH EVERY DAY, Disp: 90 capsule, Rfl: 1 .  Polyethyl Glycol-Propyl Glycol (SYSTANE OP), Place 1 drop into both eyes 2 (two) times daily as needed (dry eyes/ burning)., Disp: , Rfl:  .  topiramate (TOPAMAX) 50 MG tablet, Take 1.5 tablets (75 mg total) by mouth daily., Disp: 135 tablet, Rfl: 3 .  vitamin C (ASCORBIC ACID) 500 MG tablet, Take 500 mg by mouth 2 (two) times daily. , Disp: , Rfl:   Social History   Tobacco Use  Smoking Status Former Smoker  . Years: 12.00  . Quit date: 10/10/1981  . Years since quitting: 37.4  Smokeless Tobacco Never Used    No Known Allergies Objective:   There were no vitals filed for this visit. There is no height or weight on file to calculate BMI. Constitutional Well developed. Well nourished.  Vascular Dorsalis pedis pulses palpable. Posterior tibial pulses palpable. Capillary refill normal to all digits.  No cyanosis or clubbing noted. Pedal hair growth normal.  Neurologic Normal speech. Oriented to person, place, and time. Protective sensation absent  Dermatologic Wounds today measure 1.5 x 1.5 with granular base of the right hallux and 1.5 x 1 to the right second toe.  No warmth erythema signs of acute infection  Orthopedic: No pain to palpation right foot.   Radiographs: None Assessment:   1. Ulcer of right second toe with fat layer exposed ([place])   2. Skin ulcer of right great toe, limited to breakdown of skin [facility])    Plan:  Patient was evaluated and treated and all questions answered.  Ulcer right hallux, right 2nd toe -Wounds appear granular without signs of infection.  Cauterized with silver nitrate to promote epithelialization.  Discussed continued use of antibiotic cream discussed pressure reduction techniques follow-up in a few weeks for recheck  No follow-ups on file.

## 2019-04-05 ENCOUNTER — Other Ambulatory Visit: Payer: Self-pay | Admitting: Nurse Practitioner

## 2019-04-05 NOTE — Telephone Encounter (Signed)
[facility]  

## 2019-04-05 NOTE — Telephone Encounter (Signed)
Refill approval sent to requesting pharmacy.

## 2019-04-24 ENCOUNTER — Other Ambulatory Visit: Payer: Self-pay | Admitting: Family

## 2019-04-24 DIAGNOSIS — R69 Illness, unspecified: Secondary | ICD-10-CM | POA: Diagnosis not present

## 2019-04-24 DIAGNOSIS — E1142 Type 2 diabetes mellitus with diabetic polyneuropathy: Secondary | ICD-10-CM

## 2019-05-02 ENCOUNTER — Ambulatory Visit: Payer: Medicare HMO | Admitting: Podiatry

## 2019-05-03 ENCOUNTER — Other Ambulatory Visit: Payer: Self-pay

## 2019-05-03 ENCOUNTER — Ambulatory Visit: Payer: Medicare HMO | Admitting: Podiatry

## 2019-05-03 VITALS — Temp 96.7°F

## 2019-05-03 DIAGNOSIS — L928 Other granulomatous disorders of the skin and subcutaneous tissue: Secondary | ICD-10-CM | POA: Diagnosis not present

## 2019-05-05 NOTE — Progress Notes (Signed)
Subjective:  Patient ID: Darrell Horne, male    DOB: May 12, 1946,  MRN: [id number]  Chief Complaint  Patient presents with  . Wound Check    Right 1st and 2nd digit wound check. Pt states some clear drainage. No pus. Pt denies fever/nausea/vomiting/chills.    73 y.o. male presents for wound care.  History above confirmed with patient. Thinks the wounds are improving.  Review of Systems: Negative except as noted in the HPI. Denies N/V/F/Ch.  Past Medical History:  Diagnosis Date  . Chest pain   . Diabetes mellitus   . DM2 (diabetes mellitus, type 2) ([place])   . GERD (gastroesophageal reflux disease)   . HLD (hyperlipidemia)   . HTN (hypertension)   . PAF (paroxysmal atrial fibrillation) ([place])   . Palpitations     Current Outpatient Medications:  .  amitriptyline (ELAVIL) 50 MG tablet, Take 1 tablet (50 mg total) by mouth at bedtime., Disp: 90 tablet, Rfl: 1 .  apixaban (ELIQUIS) 5 MG TABS tablet, Take 1 tablet (5 mg total) by mouth 2 (two) times daily., Disp: 180 tablet, Rfl: 1 .  atorvastatin (LIPITOR) 40 MG tablet, Take one tablet by mouth once daily, Disp: 90 tablet, Rfl: 1 .  b complex vitamins tablet, Take 1 tablet by mouth daily., Disp: , Rfl:  .  cyclobenzaprine (FLEXERIL) 10 MG tablet, Take 1 tablet (10 mg total) by mouth 2 (two) times daily as needed for muscle spasms., Disp: 8 tablet, Rfl: 0 .  gabapentin (NEURONTIN) 600 MG tablet, Take 1 tablet (600 mg total) by mouth 3 (three) times daily., Disp: 270 tablet, Rfl: 1 .  Menthol (ICY HOT) 5 % PTCH, Apply 1 patch topically as needed (back pain)., Disp: , Rfl:  .  metFORMIN (GLUCOPHAGE-XR) 500 MG 24 hr tablet, Take two tablets by mouth daily with supper, Disp: 180 tablet, Rfl: 1 .  metoprolol succinate (TOPROL-XL) 50 MG 24 hr tablet, TAKE 1 TABLET BY MOUTH DAILY WITH SUPPER. TAKE WITH OR IMMEDIATELY FOLLOWING A MEAL., Disp: 90 tablet, Rfl: 1 .  omeprazole (PRILOSEC) 20 MG capsule, TAKE 1 CAPSULE BY MOUTH EVERY DAY, Disp: 90  capsule, Rfl: 1 .  ONETOUCH VERIO test strip, USE TO TEST BLOOD SUGAR TWICE DAILY DX: E11.42, Disp: 200 strip, Rfl: 4 .  Polyethyl Glycol-Propyl Glycol (SYSTANE OP), Place 1 drop into both eyes 2 (two) times daily as needed (dry eyes/ burning)., Disp: , Rfl:  .  topiramate (TOPAMAX) 50 MG tablet, Take 1.5 tablets (75 mg total) by mouth daily., Disp: 135 tablet, Rfl: 3 .  vitamin C (ASCORBIC ACID) 500 MG tablet, Take 500 mg by mouth 2 (two) times daily. , Disp: , Rfl:   Social History   Tobacco Use  Smoking Status Former Smoker  . Years: 12.00  . Quit date: 10/10/1981  . Years since quitting: 37.5  Smokeless Tobacco Never Used    No Known Allergies Objective:   Vitals:   05/03/19 1145  Temp: (!) 96.7 F (35.9 C)   There is no height or weight on file to calculate BMI. Constitutional Well developed. Well nourished.  Vascular Dorsalis pedis pulses palpable. Posterior tibial pulses palpable. Capillary refill normal to all digits.  No cyanosis or clubbing noted. Pedal hair growth normal.  Neurologic Normal speech. Oriented to person, place, and time. Protective sensation absent  Dermatologic Wound right 2nd toe 0.5x0.5 granular Wound right hallux 1.5x1 granular base.  Orthopedic: No pain to palpation right foot.   Radiographs: None Assessment:   1.  Other granulomatous disorders of the skin and subcutaneous tissue    Plan:  Patient was evaluated and treated and all questions answered.  Ulcer right hallux, right 2nd toe -Wounds again with superficial granular base. Cauterized with silver nitrate. 2nd toe wound almost healed. Continue silvadene and band-aid daily. F/u in 1 month for recheck.  No follow-ups on file.

## 2019-05-17 ENCOUNTER — Ambulatory Visit: Payer: Medicare HMO | Admitting: Podiatry

## 2019-05-30 DIAGNOSIS — Z961 Presence of intraocular lens: Secondary | ICD-10-CM | POA: Diagnosis not present

## 2019-05-30 DIAGNOSIS — H353131 Nonexudative age-related macular degeneration, bilateral, early dry stage: Secondary | ICD-10-CM | POA: Diagnosis not present

## 2019-05-30 DIAGNOSIS — H04123 Dry eye syndrome of bilateral lacrimal glands: Secondary | ICD-10-CM | POA: Diagnosis not present

## 2019-05-30 DIAGNOSIS — E119 Type 2 diabetes mellitus without complications: Secondary | ICD-10-CM | POA: Diagnosis not present

## 2019-05-30 LAB — HM DIABETES EYE EXAM

## 2019-05-31 ENCOUNTER — Ambulatory Visit: Payer: Medicare HMO | Admitting: Podiatry

## 2019-06-13 ENCOUNTER — Other Ambulatory Visit: Payer: Self-pay

## 2019-06-13 ENCOUNTER — Ambulatory Visit: Payer: Medicare HMO | Admitting: Podiatry

## 2019-06-13 DIAGNOSIS — M79671 Pain in right foot: Secondary | ICD-10-CM

## 2019-06-16 NOTE — Progress Notes (Signed)
Subjective:  Patient ID: Darrell Horne, male    DOB: 01-Nov-1945,  MRN: [id number]  Chief Complaint  Patient presents with   Wound Check    pt is here for awound check of the right big and second toe, pt states that he has been packing it, and is also showing no signs of infection    73 y.o. male presents for wound care.  History above confirmed with patient.   Review of Systems: Negative except as noted in the HPI. Denies N/V/F/Ch.  Past Medical History:  Diagnosis Date   Chest pain    Diabetes mellitus    DM2 (diabetes mellitus, type 2) ([facility])    GERD (gastroesophageal reflux disease)    HLD (hyperlipidemia)    HTN (hypertension)    PAF (paroxysmal atrial fibrillation) ([facility])    Palpitations     Current Outpatient Medications:    amitriptyline (ELAVIL) 50 MG tablet, Take 1 tablet (50 mg total) by mouth at bedtime., Disp: 90 tablet, Rfl: 1   apixaban (ELIQUIS) 5 MG TABS tablet, Take 1 tablet (5 mg total) by mouth 2 (two) times daily., Disp: 180 tablet, Rfl: 1   atorvastatin (LIPITOR) 40 MG tablet, Take one tablet by mouth once daily, Disp: 90 tablet, Rfl: 1   b complex vitamins tablet, Take 1 tablet by mouth daily., Disp: , Rfl:    cyclobenzaprine (FLEXERIL) 10 MG tablet, Take 1 tablet (10 mg total) by mouth 2 (two) times daily as needed for muscle spasms., Disp: 8 tablet, Rfl: 0   gabapentin (NEURONTIN) 600 MG tablet, Take 1 tablet (600 mg total) by mouth 3 (three) times daily., Disp: 270 tablet, Rfl: 1   Menthol (ICY HOT) 5 % PTCH, Apply 1 patch topically as needed (back pain)., Disp: , Rfl:    metFORMIN (GLUCOPHAGE-XR) 500 MG 24 hr tablet, Take two tablets by mouth daily with supper, Disp: 180 tablet, Rfl: 1   metoprolol succinate (TOPROL-XL) 50 MG 24 hr tablet, TAKE 1 TABLET BY MOUTH DAILY WITH SUPPER. TAKE WITH OR IMMEDIATELY FOLLOWING A MEAL., Disp: 90 tablet, Rfl: 1   omeprazole (PRILOSEC) 20 MG capsule, TAKE 1 CAPSULE BY MOUTH EVERY DAY, Disp: 90  capsule, Rfl: 1   ONETOUCH VERIO test strip, USE TO TEST BLOOD SUGAR TWICE DAILY DX: E11.42, Disp: 200 strip, Rfl: 4   Polyethyl Glycol-Propyl Glycol (SYSTANE OP), Place 1 drop into both eyes 2 (two) times daily as needed (dry eyes/ burning)., Disp: , Rfl:    topiramate (TOPAMAX) 50 MG tablet, Take 1.5 tablets (75 mg total) by mouth daily., Disp: 135 tablet, Rfl: 3   vitamin C (ASCORBIC ACID) 500 MG tablet, Take 500 mg by mouth 2 (two) times daily. , Disp: , Rfl:   Social History   Tobacco Use  Smoking Status Former Smoker   Years: 12.00   Quit date: 10/10/1981   Years since quitting: 37.7  Smokeless Tobacco Never Used    No Known Allergies Objective:   There were no vitals filed for this visit. There is no height or weight on file to calculate BMI. Constitutional Well developed. Well nourished.  Vascular Dorsalis pedis pulses palpable. Posterior tibial pulses palpable. Capillary refill normal to all digits.  No cyanosis or clubbing noted. Pedal hair growth normal.  Neurologic Normal speech. Oriented to person, place, and time. Protective sensation absent  Dermatologic Wound right 2nd toe 0.5x0.5 granular Wound right hallux 1.5x1 granular base.  Orthopedic: No pain to palpation right foot.   Radiographs: None  Assessment:   1. Right foot pain    Plan:  Patient was evaluated and treated and all questions answered.  Ulcer right hallux, right 2nd toe -Wounds slow to heal. Still granular. Cauterized today. Should they continue to be slow to heal would consider application of skin graft substitute.  Procedure: Chemical Cauterization of Granulation Tissue Rationale: Cauterize granular wound base to promote healing.  Wound Measurements: 0.5 x0.5 cm x 0.2 cm  Instrumentation: Silver nitrate stick x3 Dressing: Dry, sterile, compression dressing. Disposition: Patient tolerated procedure well. Patient to return in 1 week for follow-up.   No follow-ups on file.

## 2019-07-10 ENCOUNTER — Encounter: Payer: Self-pay | Admitting: *Deleted

## 2019-07-11 ENCOUNTER — Ambulatory Visit: Payer: Medicare HMO | Admitting: Podiatry

## 2019-07-11 ENCOUNTER — Other Ambulatory Visit: Payer: Self-pay

## 2019-07-11 DIAGNOSIS — L97512 Non-pressure chronic ulcer of other part of right foot with fat layer exposed: Secondary | ICD-10-CM | POA: Diagnosis not present

## 2019-07-11 DIAGNOSIS — I83018 Varicose veins of right lower extremity with ulcer other part of lower leg: Secondary | ICD-10-CM

## 2019-07-11 DIAGNOSIS — L97511 Non-pressure chronic ulcer of other part of right foot limited to breakdown of skin: Secondary | ICD-10-CM | POA: Diagnosis not present

## 2019-07-11 DIAGNOSIS — L97811 Non-pressure chronic ulcer of other part of right lower leg limited to breakdown of skin: Secondary | ICD-10-CM

## 2019-07-11 NOTE — Progress Notes (Addendum)
Subjective:  Patient ID: Darrell Horne, male    DOB: October 25, 1945,  MRN: [id number]  Chief Complaint  Patient presents with  . Wound Check    Pt states right foot is improving since last visit, denies any drainage, denies fever/chills/nausea/vomiting.    73 y.o. male presents for wound care.  History above confirmed with patient.   Review of Systems: Negative except as noted in the HPI. Denies N/V/F/Ch.  Past Medical History:  Diagnosis Date  . Chest pain   . Diabetes mellitus   . DM2 (diabetes mellitus, type 2) ([place])   . GERD (gastroesophageal reflux disease)   . HLD (hyperlipidemia)   . HTN (hypertension)   . PAF (paroxysmal atrial fibrillation) ([place])   . Palpitations     Current Outpatient Medications:  .  amitriptyline (ELAVIL) 50 MG tablet, Take 1 tablet (50 mg total) by mouth at bedtime., Disp: 90 tablet, Rfl: 1 .  apixaban (ELIQUIS) 5 MG TABS tablet, Take 1 tablet (5 mg total) by mouth 2 (two) times daily., Disp: 180 tablet, Rfl: 1 .  atorvastatin (LIPITOR) 40 MG tablet, Take one tablet by mouth once daily, Disp: 90 tablet, Rfl: 1 .  b complex vitamins tablet, Take 1 tablet by mouth daily., Disp: , Rfl:  .  cyclobenzaprine (FLEXERIL) 10 MG tablet, Take 1 tablet (10 mg total) by mouth 2 (two) times daily as needed for muscle spasms., Disp: 8 tablet, Rfl: 0 .  gabapentin (NEURONTIN) 600 MG tablet, Take 1 tablet (600 mg total) by mouth 3 (three) times daily., Disp: 270 tablet, Rfl: 1 .  Menthol (ICY HOT) 5 % PTCH, Apply 1 patch topically as needed (back pain)., Disp: , Rfl:  .  metFORMIN (GLUCOPHAGE-XR) 500 MG 24 hr tablet, Take two tablets by mouth daily with supper, Disp: 180 tablet, Rfl: 1 .  metoprolol succinate (TOPROL-XL) 50 MG 24 hr tablet, TAKE 1 TABLET BY MOUTH DAILY WITH SUPPER. TAKE WITH OR IMMEDIATELY FOLLOWING A MEAL., Disp: 90 tablet, Rfl: 1 .  omeprazole (PRILOSEC) 20 MG capsule, TAKE 1 CAPSULE BY MOUTH EVERY DAY, Disp: 90 capsule, Rfl: 1 .  ONETOUCH VERIO  test strip, USE TO TEST BLOOD SUGAR TWICE DAILY DX: E11.42, Disp: 200 strip, Rfl: 4 .  Polyethyl Glycol-Propyl Glycol (SYSTANE OP), Place 1 drop into both eyes 2 (two) times daily as needed (dry eyes/ burning)., Disp: , Rfl:  .  topiramate (TOPAMAX) 50 MG tablet, Take 1.5 tablets (75 mg total) by mouth daily., Disp: 135 tablet, Rfl: 3 .  vitamin C (ASCORBIC ACID) 500 MG tablet, Take 500 mg by mouth 2 (two) times daily. , Disp: , Rfl:   Social History   Tobacco Use  Smoking Status Former Smoker  . Years: 12.00  . Quit date: 10/10/1981  . Years since quitting: 37.7  Smokeless Tobacco Never Used    No Known Allergies Objective:   There were no vitals filed for this visit. There is no height or weight on file to calculate BMI. Constitutional Well developed. Well nourished.  Vascular Dorsalis pedis faintly pulses palpable. Posterior tibial pulses faintly palpable. Capillary refill normal to all digits.  No cyanosis or clubbing noted. Right leg pitting edema Pedal hair growth abnormal.  Neurologic Normal speech. Oriented to person, place, and time. Protective sensation absent  Dermatologic Wounds hallux and right 2nd toe healed Multiple small venous leg ulcers right medial leg unstageable no warmth erythema signs of infeciton  Orthopedic: No pain to palpation right foot. Hx BKA left  Radiographs: None Assessment:   1. Ulcer of right second toe with fat layer exposed ([place])   2. Skin ulcer of right great toe, limited to breakdown of skin ([facility])   3. Venous stasis ulcer of other part of right lower leg limited to breakdown of skin with varicose veins ([place])    Plan:  Patient was evaluated and treated and all questions answered.  Ulcer right hallux, right 2nd toe -Healed today. No need to continue to dress.    Venous leg ulcers right leg -Multilayer compression dressing applid  Procedure: Multilayer Compression dressing Rationale: venous insufficiency Technique: Unna boot,  cast padding, [place] compression dressing applied Disposition: Patient tolerated procedure well.    Return in about 2 weeks (around 07/25/2019) for [employer] change. with RN, then 2 weeeks thereafter for wound check.

## 2019-07-25 ENCOUNTER — Ambulatory Visit: Payer: Medicare HMO

## 2019-07-25 ENCOUNTER — Other Ambulatory Visit: Payer: Self-pay

## 2019-07-25 DIAGNOSIS — L97512 Non-pressure chronic ulcer of other part of right foot with fat layer exposed: Secondary | ICD-10-CM

## 2019-07-25 DIAGNOSIS — L97511 Non-pressure chronic ulcer of other part of right foot limited to breakdown of skin: Secondary | ICD-10-CM

## 2019-07-25 NOTE — Progress Notes (Signed)
Patient is here today for unna boot application. All wounds are scabbed over and healing. Unna boot applied to lower extremity. Instructed patient on when to remove. He is to follow up with Dr March Rummage in 2 weeks

## 2019-08-08 ENCOUNTER — Ambulatory Visit: Payer: Medicare HMO | Admitting: Podiatry

## 2019-08-22 ENCOUNTER — Other Ambulatory Visit: Payer: Self-pay | Admitting: Family

## 2019-08-22 ENCOUNTER — Telehealth: Payer: Self-pay | Admitting: Family

## 2019-08-22 ENCOUNTER — Other Ambulatory Visit: Payer: Self-pay

## 2019-08-22 ENCOUNTER — Ambulatory Visit: Payer: Medicare HMO | Admitting: Podiatry

## 2019-08-22 DIAGNOSIS — I83018 Varicose veins of right lower extremity with ulcer other part of lower leg: Secondary | ICD-10-CM | POA: Diagnosis not present

## 2019-08-22 DIAGNOSIS — L97811 Non-pressure chronic ulcer of other part of right lower leg limited to breakdown of skin: Secondary | ICD-10-CM

## 2019-08-22 NOTE — Telephone Encounter (Signed)
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 A message was sent to the administrative staff to contact patient to confirm who is his primary care provider

## 2019-08-22 NOTE — Telephone Encounter (Signed)
Left message asking patient to give [place] a call to schedule follow up appt.

## 2019-08-28 ENCOUNTER — Encounter: Payer: Self-pay | Admitting: Family

## 2019-08-28 ENCOUNTER — Other Ambulatory Visit: Payer: Self-pay

## 2019-08-28 ENCOUNTER — Other Ambulatory Visit: Payer: Self-pay | Admitting: Family

## 2019-08-28 ENCOUNTER — Ambulatory Visit (INDEPENDENT_AMBULATORY_CARE_PROVIDER_SITE_OTHER): Payer: Medicare HMO | Admitting: Family

## 2019-08-28 VITALS — BP 112/68 | HR 68 | Temp 97.1°F | Resp 18 | Ht 70.0 in | Wt 263.9 lb

## 2019-08-28 DIAGNOSIS — F32A Depression, unspecified: Secondary | ICD-10-CM

## 2019-08-28 DIAGNOSIS — R69 Illness, unspecified: Secondary | ICD-10-CM | POA: Diagnosis not present

## 2019-08-28 DIAGNOSIS — Z23 Encounter for immunization: Secondary | ICD-10-CM

## 2019-08-28 DIAGNOSIS — E782 Mixed hyperlipidemia: Secondary | ICD-10-CM

## 2019-08-28 DIAGNOSIS — F329 Major depressive disorder, single episode, unspecified: Secondary | ICD-10-CM

## 2019-08-28 DIAGNOSIS — D696 Thrombocytopenia, unspecified: Secondary | ICD-10-CM

## 2019-08-28 DIAGNOSIS — I10 Essential (primary) hypertension: Secondary | ICD-10-CM

## 2019-08-28 DIAGNOSIS — E1142 Type 2 diabetes mellitus with diabetic polyneuropathy: Secondary | ICD-10-CM

## 2019-08-28 DIAGNOSIS — K219 Gastro-esophageal reflux disease without esophagitis: Secondary | ICD-10-CM

## 2019-08-28 DIAGNOSIS — Z1159 Encounter for screening for other viral diseases: Secondary | ICD-10-CM | POA: Diagnosis not present

## 2019-08-28 NOTE — Progress Notes (Signed)
Provider: Marlowe Sax FNP-C   Laquesha Holcomb, Nelda Bucks, NP  Patient Care Team: Shareece Bultman, Nelda Bucks, NP as PCP - General (Family Medicine) Clent Jacks, MD as Consulting Physician (Ophthalmology)  Extended Emergency Contact Information Primary Emergency Contact: Sternberg,Anna Address: [place], [place] [address] [place] Phone: [phone] Relation: Spouse Secondary Emergency Contact: Thomasenia Bottoms [place] Phone: [phone] Relation: Sister  Code Status: Full Code  Goals of care: Advanced Directive information Advanced Directives 01/29/2019  Does Patient Have a Medical Advance Directive? Yes  Type of Advance Directive Living will  Does patient want to make changes to medical advance directive? -  Copy of [place] in Chart? -  Would patient like information on creating a medical advance directive? -     Chief Complaint  Patient presents with  . Medical Management of Chronic Issues    7 month follow up   . Quality Metric Gaps    Patient sees Dr. March Rummage for his feet, patient would like to get flu vaccine, Tetanus shot sent to pharmacy, Hepatitis C Screening     HPI:  Pt is a 73 y.o. male seen today for medical management of chronic diseases.He denies any acute issues during visit.He states continues to follow up with podiatrist Dr.Price for his right foot ulcer.states ulcer healed but recently wore closed shoes due to heavy rain and developed an ulcer on his second toe.He denies any drainage from the ulcer.Has band aid on applied by care giver.     Type 2 DM- does not check CBG at home but taking medication as directed.on metformin -XR 500 mg tablet daily.decline any symptoms of hypo/hyperglycemia.    Hypertension - No home blood pressure for review.On Metoprolol succinate 50 mg 24 Hr tablet daily.He denies any headache,dizziness,pa;pitation,chest pain or shortness of breath.   GERD- states symptoms  controlled on omeprazole 20 mg capsule.  Depression - states doing well.on Amitriptyline 50 mg tablet at bedtime.Care giver reports no issues.   Hyperlipidemia - on atorvastatin on 40 mg tablet daily.    Past Medical History:  Diagnosis Date  . Chest pain   . Diabetes mellitus   . DM2 (diabetes mellitus, type 2) ([place])   . GERD (gastroesophageal reflux disease)   . HLD (hyperlipidemia)   . HTN (hypertension)   . PAF (paroxysmal atrial fibrillation) ([place])   . Palpitations    Past Surgical History:  Procedure Laterality Date  . APPENDECTOMY    . CHOLECYSTECTOMY    . COLONOSCOPY     Dr. Michail Sermon  . COLONOSCOPY WITH PROPOFOL N/A 11/06/2018   Procedure: COLONOSCOPY WITH PROPOFOL;  Surgeon: Wilford Corner, MD;  Location: [facility];  Service: Endoscopy;  Laterality: N/A;  . HERNIA REPAIR    . LEG AMPUTATION Left [id number]   Dr. Kendell Bane  . POLYPECTOMY  11/06/2018   Procedure: POLYPECTOMY;  Surgeon: Wilford Corner, MD;  Location: [facility];  Service: Endoscopy;;  . SHOULDER SURGERY  [id number]   Dr. Meridee Score  . TOTAL ANKLE ARTHROPLASTY  [id number]    No Known Allergies  Allergies as of 08/28/2019   No Known Allergies     Medication List       Accurate as of August 28, 2019 11:01 AM. If you have any questions, ask your nurse or doctor.        STOP taking these medications   cyclobenzaprine 10 MG tablet Commonly known as: FLEXERIL  Stopped by: Sandrea Hughs, NP   [facility]ANE OP Stopped by: Sandrea Hughs, NP     TAKE these medications   amitriptyline 50 MG tablet Commonly known as: ELAVIL Take 1 tablet (50 mg total) by mouth at bedtime.   apixaban 5 MG Tabs tablet Commonly known as: Eliquis Take 1 tablet (5 mg total) by mouth 2 (two) times daily.   atorvastatin 40 MG tablet Commonly known as: LIPITOR Take one tablet by mouth once daily   b complex vitamins tablet Take 1 tablet by mouth daily.   gabapentin 600 MG tablet Commonly known as:  Neurontin Take 1 tablet (600 mg total) by mouth 3 (three) times daily.   Icy Hot 5 % Ptch Generic drug: Menthol Apply 1 patch topically as needed (back pain).   metFORMIN 500 MG 24 hr tablet Commonly known as: GLUCOPHAGE-XR Take two tablets by mouth daily with supper   metoprolol succinate 50 MG 24 hr tablet Commonly known as: TOPROL-XL TAKE 1 TABLET BY MOUTH DAILY WITH SUPPER. TAKE WITH OR IMMEDIATELY FOLLOWING A MEAL.   omeprazole 20 MG capsule Commonly known as: PRILOSEC TAKE 1 CAPSULE BY MOUTH EVERY DAY   OneTouch Verio test strip Generic drug: glucose blood USE TO TEST BLOOD SUGAR TWICE DAILY DX: E11.42   topiramate 50 MG tablet Commonly known as: TOPAMAX Take 1.5 tablets (75 mg total) by mouth daily.   vitamin B-12 1000 MCG tablet Commonly known as: CYANOCOBALAMIN Take 1,000 mcg by mouth daily.   vitamin C 500 MG tablet Commonly known as: ASCORBIC ACID Take 500 mg by mouth 2 (two) times daily.       Review of Systems  Constitutional: Negative for appetite change, chills, fatigue and fever.  HENT: Positive for hearing loss. Negative for congestion, ear discharge, ear pain and rhinorrhea.        Lost his hearing aid will make an appt with ENT  Respiratory: Negative for cough, chest tightness, shortness of breath and wheezing.   Cardiovascular: Negative for chest pain, palpitations and leg swelling.  Gastrointestinal: Negative for abdominal distention, abdominal pain, constipation, diarrhea, nausea and vomiting.  Endocrine: Negative for cold intolerance, heat intolerance, polydipsia, polyphagia and polyuria.  Genitourinary: Negative for decreased urine volume, difficulty urinating, dysuria, flank pain, frequency, hematuria and urgency.  Musculoskeletal: Positive for arthralgias, back pain and gait problem.       Uses a Rolling walker.No recent fall in the past 2 weeks.   Skin: Positive for wound. Negative for color change, pallor and rash.       Right foot ulcer  f/u with podiatrist   Neurological: Positive for numbness. Negative for dizziness, weakness, light-headedness and headaches.  Hematological: Does not bruise/bleed easily.  Psychiatric/Behavioral: Negative for agitation, confusion, sleep disturbance and suicidal ideas. The patient is not nervous/anxious.     Immunization History  Administered Date(s) Administered  . DTaP 10/10/2014  . Influenza,inj,Quad PF,6+ Mos 08/05/2016  . Influenza-Unspecified 10/10/2014  . Pneumococcal Conjugate-13 11/11/2016  . Pneumococcal-Unspecified 10/10/2014   Pertinent  Health Maintenance Due  Topic Date Due  . HEMOGLOBIN A1C  04/29/2019  . INFLUENZA VACCINE  05/11/2019  . URINE MICROALBUMIN  10/30/2019  . OPHTHALMOLOGY EXAM  05/29/2020  . FOOT EXAM  08/21/2020  . COLONOSCOPY  11/06/2028  . PNA vac Low Risk Adult  Completed   Fall Risk  08/28/2019 02/05/2019 01/29/2019 10/29/2018 01/24/2018  Falls in the past year? 1 0 0 0 Yes  Number falls in past yr: 0 0 0 0 1  Injury with Fall? 0 0 0 0 No  Comment - - - - -  Risk Factor Category  - - - - -  Risk for fall due to : - - - - -  Follow up - - - - -    Vitals:   08/28/19 1039  BP: 112/68  Pulse: 68  Temp: (!) 97.1 F (36.2 C)  TempSrc: Temporal  SpO2: 93%  Weight: 263 lb 14.4 oz (119.7 kg)  Height: 5\' 10"  (1.778 m)   Body mass index is 37.87 kg/m. Physical Exam Vitals signs reviewed.  Constitutional:      General: He is not in acute distress.    Appearance: He is obese. He is not ill-appearing.  HENT:     Head: Normocephalic.     Right Ear: Tympanic membrane, ear canal and external ear normal. There is no impacted cerumen.     Left Ear: Tympanic membrane, ear canal and external ear normal. There is no impacted cerumen.     Nose: Nose normal. No congestion or rhinorrhea.     Mouth/Throat:     Mouth: Mucous membranes are moist.     Pharynx: Oropharynx is clear. No oropharyngeal exudate or posterior oropharyngeal erythema.  Eyes:      General: No scleral icterus.       Right eye: No discharge.        Left eye: No discharge.     Extraocular Movements: Extraocular movements intact.     Conjunctiva/sclera: Conjunctivae normal.     Pupils: Pupils are equal, round, and reactive to light.  Neck:     Musculoskeletal: Normal range of motion. No neck rigidity or muscular tenderness.     Vascular: No carotid bruit.  Cardiovascular:     Rate and Rhythm: Normal rate and regular rhythm.     Pulses: Normal pulses.     Heart sounds: Normal heart sounds. No murmur. No friction rub. No gallop.   Pulmonary:     Effort: Pulmonary effort is normal. No respiratory distress.     Breath sounds: Normal breath sounds. No wheezing, rhonchi or rales.  Chest:     Chest wall: No tenderness.  Abdominal:     General: Bowel sounds are normal. There is no distension.     Palpations: Abdomen is soft. There is no mass.     Tenderness: There is no abdominal tenderness. There is no right CVA tenderness, left CVA tenderness, guarding or rebound.  Musculoskeletal:        General: No swelling or tenderness.     Right lower leg: No edema.     Left lower leg: No edema.     Comments: Unsteady gait on wheelchair during visit   Lymphadenopathy:     Cervical: No cervical adenopathy.  Skin:    General: Skin is warm and dry.     Coloration: Skin is not pale.     Findings: No bruising or erythema.     Comments: Right Great Toe and 2 nd toe ulcer no drainage or signs of infections.    Neurological:     Mental Status: He is alert and oriented to person, place, and time.     Cranial Nerves: No cranial nerve deficit.     Sensory: No sensory deficit.     Motor: No weakness.     Coordination: Coordination normal.     Gait: Gait normal.  Psychiatric:        Mood and Affect: Mood normal.  Behavior: Behavior normal.        Thought Content: Thought content normal.        Judgment: Judgment normal.    Labs reviewed: Recent Labs    10/29/18 1117  NA  143  K 4.4  CL 111*  CO2 23  GLUCOSE 112  BUN 9  CREATININE 0.79  CALCIUM 9.0   Recent Labs    10/29/18 1117  AST 19  ALT 19  BILITOT 0.5  PROT 6.5   Recent Labs    10/29/18 1117  WBC 6.9  NEUTROABS 4,133  HGB 13.9  HCT 41.2  MCV 91.4  PLT 99*   Lab Results  Component Value Date   TSH 1.37 10/31/2017   Lab Results  Component Value Date   HGBA1C 6.0 (H) 10/29/2018   Lab Results  Component Value Date   CHOL 127 10/31/2017   HDL 85 10/31/2017   LDLCALC 27 10/31/2017   TRIG 66 10/31/2017   CHOLHDL 1.5 10/31/2017    Significant Diagnostic Results in last 30 days:  No results found.  Assessment/Plan  1. Type 2 diabetes mellitus with diabetic polyneuropathy, unspecified whether long term insulin use (HCC) No CBG for review.continue on metformin 500 mg tablet daily.on Atorvastatin,BBB and EliQuis.continue on Gabapentin 600 mg tablet three times daily for neuropathy.continue to follow up with Podiatrist.up to date on diabetic annual eye exam.Influenza vaccine administered this visit.  - Hgb A1C   2. Essential hypertension B/p at goal.continue on Metoprolol succinate 50 mg 24 Hr tablet daily.on Atorvastatin and EliQuis. - CBC /dff,CMP,TSH level   3. Gastroesophageal reflux disease without esophagitis Symptoms under control.encouraged to avoid aggravating foods such as species.continue on omeprazole 20 mg capsule daily.  4. Depression, unspecified depression type Mood stable.continue on Amitriptyline 50 mg tablet at bedtime. TSH level   5. Mixed hyperlipidemia Latest LDL at goal.continue on atorvastatin on 40 mg tablet daily.Encourged dietary modification.exercise limited due to wheelchair bound. - Lipid panel    6. Right Great Toe and 2 nd toe ulcer  Progressive healing.No drainage or signs of infections.continue to follow up with Podiatrist.  7. Need for influenza vaccination Afebrile.No signs of URI's. - Flu Vaccine QUAD High Dose(Fluad) given by CMA   7. Need for hepatitis C screening test Does not recall previous testing.  - Hep C Antibody  8. Need for Tdap vaccination TDap order send to pharmacy.Instructed patient get vaccine in his pharmacy.  9. Thrombocytopenia ([place]) Previous Plts 99 now trending down to 88 will refer to Hematology for evaluation. - Ambulatory referral to Hematology     Family/ staff Communication: Reviewed plan of care with patient and Care giver   Labs/tests ordered: - CBC /dff - CMP - TSH level  -  Hgb A1C  - Lipid panel  Next appointment: 4 months for medical management of chronic issues.   Sandrea Hughs, NP

## 2019-09-02 ENCOUNTER — Telehealth: Payer: Self-pay | Admitting: Adult Health

## 2019-09-02 NOTE — Telephone Encounter (Signed)
A new hem appt has been scheduled for Darrell Horne to see Darrell Horne on 12/9 at 10am w/labs at 930am. Appt date and time has been given to the pt's wife. Mrs. Routhier has been made aware for the pt to arrive 15 minutes early.

## 2019-09-03 LAB — CBC WITH DIFFERENTIAL/PLATELET
Absolute Monocytes: 416 cells/uL (ref 200–950)
Basophils Absolute: 19 cells/uL (ref 0–200)
Basophils Relative: 0.3 %
Eosinophils Absolute: 57 cells/uL (ref 15–500)
Eosinophils Relative: 0.9 %
HCT: 40.3 % (ref 38.5–50.0)
Hemoglobin: 13.1 g/dL — ABNORMAL LOW (ref 13.2–17.1)
Lymphs Abs: 1153 cells/uL (ref 850–3900)
MCH: 31 pg (ref 27.0–33.0)
MCHC: 32.5 g/dL (ref 32.0–36.0)
MCV: 95.3 fL (ref 80.0–100.0)
MPV: 12 fL (ref 7.5–12.5)
Monocytes Relative: 6.6 %
Neutro Abs: 4656 cells/uL (ref 1500–7800)
Neutrophils Relative %: 73.9 %
Platelets: 84 10*3/uL — ABNORMAL LOW (ref 140–400)
RBC: 4.23 10*6/uL (ref 4.20–5.80)
RDW: 14 % (ref 11.0–15.0)
Total Lymphocyte: 18.3 %
WBC: 6.3 10*3/uL (ref 3.8–10.8)

## 2019-09-03 LAB — COMPLETE METABOLIC PANEL WITH GFR
AG Ratio: 1.2 (calc) (ref 1.0–2.5)
ALT: 15 U/L (ref 9–46)
AST: 19 U/L (ref 10–35)
Albumin: 3.5 g/dL — ABNORMAL LOW (ref 3.6–5.1)
Alkaline phosphatase (APISO): 150 U/L — ABNORMAL HIGH (ref 35–144)
BUN: 9 mg/dL (ref 7–25)
CO2: 27 mmol/L (ref 20–32)
Calcium: 8.6 mg/dL (ref 8.6–10.3)
Chloride: 105 mmol/L (ref 98–110)
Creat: 0.79 mg/dL (ref 0.70–1.18)
GFR, Est African American: 103 mL/min/{1.73_m2} (ref >=60)
GFR, Est Non African American: 89 mL/min/{1.73_m2} (ref >=60)
Globulin: 2.9 g/dL (calc) (ref 1.9–3.7)
Glucose, Bld: 100 mg/dL — ABNORMAL HIGH (ref 65–99)
Potassium: 4.3 mmol/L (ref 3.5–5.3)
Sodium: 140 mmol/L (ref 135–146)
Total Bilirubin: 0.6 mg/dL (ref 0.2–1.2)
Total Protein: 6.4 g/dL (ref 6.1–8.1)

## 2019-09-03 LAB — LIPID PANEL
Cholesterol: 138 mg/dL (ref <200)
HDL: 68 mg/dL (ref >=40)
LDL Cholesterol (Calc): 56 mg/dL (calc)
Non-HDL Cholesterol (Calc): 70 mg/dL (calc) (ref <130)
Total CHOL/HDL Ratio: 2 (calc) (ref <5.0)
Triglycerides: 59 mg/dL (ref <150)

## 2019-09-03 LAB — TSH: TSH: 1.7 mIU/L (ref 0.40–4.50)

## 2019-09-03 LAB — HEPATITIS C ANTIBODY
Hepatitis C Ab: REACTIVE — AB
SIGNAL TO CUT-OFF: 1.1 — ABNORMAL HIGH (ref <1.00)

## 2019-09-03 LAB — HEMOGLOBIN A1C
Hgb A1c MFr Bld: 5.8 % of total Hgb — ABNORMAL HIGH (ref <5.7)
Mean Plasma Glucose: 120 (calc)
eAG (mmol/L): 6.6 (calc)

## 2019-09-03 LAB — HCV RNA,QUANTITATIVE REAL TIME PCR
HCV Quantitative Log: <1.18 Log IU/mL
HCV RNA, PCR, QN: <15 IU/mL

## 2019-09-12 ENCOUNTER — Other Ambulatory Visit: Payer: Self-pay | Admitting: Family

## 2019-09-17 NOTE — Progress Notes (Addendum)
[place]  [phone] [phone]     ID: Darrell Horne DOB: 1946-09-29  MR#: [id number]  YCX#:[id number]  Patient Care Team: Sandrea Hughs, NP as PCP - General (Family Medicine) Clent Jacks, MD as Consulting Physician (Ophthalmology) Scot Dock, NP OTHER MD:  CHIEF COMPLAINT: thrombocytopenia  CURRENT TREATMENT: observation   HISTORY OF CURRENT ILLNESS:  Darrell Horne was referred to [place] after routine labs noted that his plt count had decreased from 99 at the beginning of 2020 to 84 now.  Of note he also screened positive for Hepatitis C in 08/2019 as well.  He has h/o ETOH cirrhosis and notes he stopped drinking heavily 10-15 years ago.  He continues to drink 2 glasses of ETOH per night.    The patient's subsequent history is as detailed below.  INTERVAL HISTORY: Darrell Horne is here with his wife Darrell Horne today about his thrombocyopenia.  He is diabetic and does have some easy bruising/bleeding.  He denies any blood in his stool/black tarry stool.  He was unaware that he tested positive for hepatitis C.    REVIEW OF SYSTEMS:  Darrell Horne denies any fever, chills, chest pain, palpitations, cough, shortness of breath, headaches, bowel/bladder changes.  He denies any night sweats, unintentional weight loss, abodminal distention.  A detailed ROS was otherwise non contributory today.      PAST MEDICAL HISTORY: Past Medical History:  Diagnosis Date   Chest pain    Diabetes mellitus    DM2 (diabetes mellitus, type 2) (HCC)    GERD (gastroesophageal reflux disease)    HLD (hyperlipidemia)    HTN (hypertension)    PAF (paroxysmal atrial fibrillation) ([place])    Palpitations     PAST SURGICAL HISTORY: Past Surgical History:  Procedure Laterality Date   APPENDECTOMY     CHOLECYSTECTOMY     COLONOSCOPY     Dr. Michail Sermon   COLONOSCOPY WITH PROPOFOL N/A 11/06/2018   Procedure: COLONOSCOPY WITH PROPOFOL;  Surgeon: Wilford Corner, MD;   Location: [facility];  Service: Endoscopy;  Laterality: N/A;   HERNIA REPAIR     LEG AMPUTATION Left [id number]   Dr. Kendell Bane   POLYPECTOMY  11/06/2018   Procedure: POLYPECTOMY;  Surgeon: Wilford Corner, MD;  Location: [facility];  Service: Endoscopy;;   SHOULDER SURGERY  [id number]   Dr. Meridee Score   TOTAL ANKLE ARTHROPLASTY  [id number]    FAMILY HISTORY Family History  Problem Relation Age of Onset   Heart disease Mother        Living   Stroke Father 18       Deceased   Diabetes Sister    Cancer Sister         SOCIAL HISTORY:  Lives alone, at [facility], independent living section.  Able to do activities of daily living.  Wife Darrell Horne lives about 15 minutes away from patient.  She provides all of Darrell Horne's transportation.  He has no children.  Current patient of [facility].  Former smoker, quit in 1982.  Drinks 2 glasses of wine every night. Previously drank more than this, and that stopped about 10-15 years ago.  No drug use.       ADVANCED DIRECTIVES: Living will has been completed.  This is on file with [facility].     HEALTH MAINTENANCE: Social History   Tobacco Use   Smoking status: Former Smoker    Years: 12.00    Quit date: 10/10/1981  Years since quitting: 37.9   Smokeless tobacco: Never Used  Substance Use Topics   Alcohol use: Yes    Comment: has glass of wine once a week but states rarely    Drug use: No     Colonoscopy: up to date per patient  PSA: checked last in 12/2015  Skin checks: none     No Known Allergies  Current Outpatient Medications  Medication Sig Dispense Refill   amitriptyline (ELAVIL) 50 MG tablet Take 1 tablet (50 mg total) by mouth at bedtime. 90 tablet 1   apixaban (ELIQUIS) 5 MG TABS tablet Take 1 tablet (5 mg total) by mouth 2 (two) times daily. 180 tablet 1   atorvastatin (LIPITOR) 40 MG tablet Take one tablet by mouth once daily 90 tablet 1   b complex  vitamins tablet Take 1 tablet by mouth daily.     gabapentin (NEURONTIN) 600 MG tablet Take 1 tablet (600 mg total) by mouth 3 (three) times daily. 270 tablet 1   Menthol (ICY HOT) 5 % PTCH Apply 1 patch topically as needed (back pain).     metFORMIN (GLUCOPHAGE-XR) 500 MG 24 hr tablet Take two tablets by mouth daily with supper 180 tablet 1   metoprolol succinate (TOPROL-XL) 50 MG 24 hr tablet TAKE 1 TABLET BY MOUTH DAILY WITH SUPPER. TAKE WITH OR IMMEDIATELY FOLLOWING A MEAL. 90 tablet 1   omeprazole (PRILOSEC) 20 MG capsule TAKE 1 CAPSULE BY MOUTH EVERY DAY 90 capsule 1   ONETOUCH VERIO test strip USE TO TEST BLOOD SUGAR TWICE DAILY DX: E11.42 200 strip 4   Tdap (ADACEL) 02-08-14.5 LF-MCG/0.5 injection Inject 0.5 mLs into the muscle once.     topiramate (TOPAMAX) 50 MG tablet Take 1.5 tablets (75 mg total) by mouth daily. 135 tablet 3   vitamin B-12 (CYANOCOBALAMIN) 1000 MCG tablet Take 1,000 mcg by mouth daily.     vitamin C (ASCORBIC ACID) 500 MG tablet Take 500 mg by mouth 2 (two) times daily.      No current facility-administered medications for this visit.     OBJECTIVE:  Vitals:   09/18/19 1040  BP: (!) 111/56  Pulse: 97  SpO2: 100%     Body mass index is 37.82 kg/m.   Wt Readings from Last 3 Encounters:  09/18/19 263 lb 9.6 oz (119.6 kg)  08/28/19 263 lb 14.4 oz (119.7 kg)  01/29/19 260 lb (117.9 kg)  ECOG FS:2 - Symptomatic, <50% confined to bed GENERAL: Patient is a chronically ill appearing man in no acute distress HEENT:  Sclerae anicteric.  Oropharynx clear and moist. No ulcerations or evidence of oropharyngeal candidiasis. Neck is supple.  NODES:  No cervical, supraclavicular, or axillary lymphadenopathy palpated.  LUNGS:  Clear to auscultation bilaterally.  No wheezes or rhonchi. HEART:  Regular rate and rhythm. No murmur appreciated. ABDOMEN:  Soft, nontender.  Positive, normoactive bowel sounds. No organomegaly palpated. MSK:  No focal spinal tenderness  to palpations.  S/p left bka SKIN:  Clear with no obvious rashes or skin changes.  NEURO:  Nonfocal. Well oriented.  Appropriate affect.     LAB RESULTS:  CMP     Component Value Date/Time   NA 140 08/28/2019 1134   NA 140 11/04/2015 0931   K 4.3 08/28/2019 1134   CL 105 08/28/2019 1134   CO2 27 08/28/2019 1134   GLUCOSE 100 (H) 08/28/2019 1134   BUN 9 08/28/2019 1134   BUN 11 11/04/2015 0931   CREATININE 0.79 08/28/2019 1134  CALCIUM 8.6 08/28/2019 1134  ° PROT 6.4 08/28/2019 1134  ° PROT 6.7 11/04/2015 0931  ° ALBUMIN 3.6 11/11/2016 0956  ° ALBUMIN 3.6 11/04/2015 0931  ° AST 19 08/28/2019 1134  ° ALT 15 08/28/2019 1134  ° ALKPHOS 148 (H) 11/11/2016 0956  ° BILITOT 0.6 08/28/2019 1134  ° BILITOT 0.8 11/04/2015 0931  ° GFRNONAA 89 08/28/2019 1134  ° GFRAA 103 08/28/2019 1134  ° ° °No results found for: TOTALPROTELP, ALBUMINELP, A1GS, A2GS, BETS, BETA2SER, GAMS, MSPIKE, SPEI ° °No results found for: KPAFRELGTCHN, LAMBDASER, KAPLAMBRATIO ° °Lab Results  °Component Value Date  ° WBC 6.5 09/18/2019  ° NEUTROABS 4.7 09/18/2019  ° HGB 12.3 (L) 09/18/2019  ° HCT 38.2 (L) 09/18/2019  ° MCV 99.7 09/18/2019  ° PLT 167 09/18/2019  ° ° °  Chemistry   °   °Component Value Date/Time  ° NA 140 08/28/2019 1134  ° NA 140 11/04/2015 0931  ° K 4.3 08/28/2019 1134  ° CL 105 08/28/2019 1134  ° CO2 27 08/28/2019 1134  ° BUN 9 08/28/2019 1134  ° BUN 11 11/04/2015 0931  ° CREATININE 0.79 08/28/2019 1134  °    °Component Value Date/Time  ° CALCIUM 8.6 08/28/2019 1134  ° ALKPHOS 148 (H) 11/11/2016 0956  ° AST 19 08/28/2019 1134  ° ALT 15 08/28/2019 1134  ° BILITOT 0.6 08/28/2019 1134  ° BILITOT 0.8 11/04/2015 0931  °  ° ° ° °No results found for: LABCA2 ° °No components found for: LABCAN125 ° °No results for input(s): INR in the last 168 hours. ° °No results found for: LABCA2 ° °No results found for: CAN199 ° °No results found for: CAN125 ° °No results found for: CAN153 ° °No results found for: CA2729 ° °No components  found for: HGQUANT ° °No results found for: CEA1 / No results found for: CEA1  ° °No results found for: AFPTUMOR ° °No results found for: CHROMOGRNA ° °Lab Results  °Component Value Date  ° PSA1 0.5 12/09/2015  ° ° °Appointment on 09/18/2019  °Component Date Value Ref Range Status  °• Smear Review 09/18/2019 SMEAR STAINED AND AVAILABLE FOR REVIEW   Final  ° Performed at [facility], [address] [place], [place] [address]  °• WBC Count 09/18/2019 6.5  4.0 - 10.5 K/uL Final  °• RBC 09/18/2019 3.83* 4.22 - 5.81 MIL/uL Final  °• Hemoglobin 09/18/2019 12.3* 13.0 - 17.0 g/dL Final  °• HCT 09/18/2019 38.2* 39.0 - 52.0 % Final  °• MCV 09/18/2019 99.7  80.0 - 100.0 fL Final  °• MCH 09/18/2019 32.1  26.0 - 34.0 pg Final  °• MCHC 09/18/2019 32.2  30.0 - 36.0 g/dL Final  °• RDW 09/18/2019 15.0  11.5 - 15.5 % Final  °• Platelet Count 09/18/2019 167  150 - 400 K/uL Final  °• nRBC 09/18/2019 0.0  0.0 - 0.2 % Final  °• Neutrophils Relative % 09/18/2019 71  % Final  °• Neutro Abs 09/18/2019 4.7  1.7 - 7.7 K/uL Final  °• Lymphocytes Relative 09/18/2019 21  % Final  °• Lymphs Abs 09/18/2019 1.3  0.7 - 4.0 K/uL Final  °• Monocytes Relative 09/18/2019 7  % Final  °• Monocytes Absolute 09/18/2019 0.4  0.1 - 1.0 K/uL Final  °• Eosinophils Relative 09/18/2019 1  % Final  °• Eosinophils Absolute 09/18/2019 0.1  0.0 - 0.5 K/uL Final  °• Basophils Relative 09/18/2019 0  % Final  °• Basophils Absolute 09/18/2019 0.0  0.0 - 0.1 K/uL   Final  °• Immature Granulocytes 09/18/2019 0  % Final  °• Abs Immature Granulocytes 09/18/2019 0.02  0.00 - 0.07 K/uL Final  ° Performed at [facility], [address] [place], [place] [address]  °  °(this displays the last labs from the last 3 days) ° °No results found for: TOTALPROTELP, ALBUMINELP, A1GS, A2GS, BETS, BETA2SER, GAMS, MSPIKE, SPEI °(this displays SPEP labs) ° °No results found for: KPAFRELGTCHN, LAMBDASER, KAPLAMBRATIO °(kappa/lambda light  chains) ° °No results found for: HGBA, HGBA2QUANT, HGBFQUANT, HGBSQUAN °(Hemoglobinopathy evaluation)  ° °No results found for: LDH ° °No results found for: IRON, TIBC, IRONPCTSAT °(Iron and TIBC) ° °No results found for: FERRITIN ° °Urinalysis °   °Component Value Date/Time  ° COLORURINE YELLOW 02/06/2019 1055  ° APPEARANCEUR CLEAR 02/06/2019 1055  ° LABSPEC 1.007 02/06/2019 1055  ° PHURINE > OR = 8.5 (A) 02/06/2019 1055  ° GLUCOSEU NEGATIVE 02/06/2019 1055  ° HGBUR NEGATIVE 02/06/2019 1055  ° BILIRUBINUR NEGATIVE 04/19/2017 1332  ° KETONESUR NEGATIVE 02/06/2019 1055  ° PROTEINUR NEGATIVE 02/06/2019 1055  ° UROBILINOGEN 0.2 07/08/2014 0137  ° NITRITE NEGATIVE 02/06/2019 1055  ° LEUKOCYTESUR 3+ (A) 02/06/2019 1055  ° ° ° °STUDIES: °No results found. ° °ASSESSMENT: 73 y.o. [place] man with chronic medical issues including HTN, type 2 diabetes s/p BKA, depression, Hepatitis C, ETOH cirrhosis, referred to hematology for evaluation of thrombocytopenia. ° °1. Platelets normal 1 year ago.  10/2018 were 99, 08/2019 decreased to 84; 167 today, 09/18/2019 ° °2. 08/2019: screened positive for chronic Hepatitis C.   ° °PLAN: ° °Tayvon's labs show that his platelet count is much improved today.  It is 167.  He met with myself and Dr. Magrinat to review his thrombocytopenia in detail.  We discussed what the platelets do in his blood cells and causes as to why the platelets can decrease.  Two causes that are likely related to him, include ETOH intake, and cirrhosis.  We recommended that refrain from ETOH use. ° °He does have hepatitis c.  I placed a referral today for him to see Dr. Comer in infectious disease to discuss further.   ° °Jeydan does not need a follow up appointment with us, however we are happy to see him at any point in the future should the need arise.  He was recommended to follow the current pandemic precautions.  He knows to call for any questions or concerns prior to his next appointment with us.   ° ° °  , NP   09/18/2019 10:57 AM °Medical Oncology and Hematology °[facility] °[address] °[place], [place] [address] °Tel. [phone]    Fax. [phone] ° ° °ADDENDUM: °I met with Mr. Glace and his wife today to discuss his cytopenias.  While there are many possible causes of thrombocytopenia, his is most likely due simply to alcohol abuse.  We did review her his peripheral film today and I did not see any schistocytes, immaturity in the white cells, or any significant anisocytosis in the red cell series.  Furthermore his platelet count has returned to normal as simply would discontinuing alcohol. ° °In situations like this also there is frequently splenic sequestration but that does not seem to be the case now. ° °We did discuss the fact that the bone marrow makes all the blood cells and that there are 3 types of blood cells.  His red cells are currently slightly low.  He is on B12 supplementation.  I think it would   be a good idea if this patient were on chronic folate supplementation and we are calling that in for him.  However his anemia is minimal and is white cell count is unremarkable.  He was informed that he has tested positive for hepatitis C.  We placed a referral to infectious diseases today.  I reassured the patient and his wife that I do not see a significant hematologic problem.  He was strongly advised to discontinue alcohol and if he cannot absolutely abstain then drink no more than 1 glass of wine or the equivalent daily.    I personally saw this patient and performed a substantive portion of this encounter with the listed APP documented above.  I will be glad to see Mr. Lieurance again if and when the need arises but as of now are making no further routine appointments for him here.  Chauncey Cruel, MD Medical Oncology and Hematology [facility] [address] [place], [place] [address] Tel. [phone]    Fax. [phone]

## 2019-09-18 ENCOUNTER — Inpatient Hospital Stay: Payer: Medicare HMO

## 2019-09-18 ENCOUNTER — Inpatient Hospital Stay: Payer: Medicare HMO | Attending: Adult Health | Admitting: Adult Health

## 2019-09-18 ENCOUNTER — Encounter: Payer: Self-pay | Admitting: Adult Health

## 2019-09-18 ENCOUNTER — Other Ambulatory Visit: Payer: Self-pay

## 2019-09-18 VITALS — BP 111/56 | HR 97 | Ht 70.0 in | Wt 263.6 lb

## 2019-09-18 DIAGNOSIS — B182 Chronic viral hepatitis C: Secondary | ICD-10-CM | POA: Diagnosis not present

## 2019-09-18 DIAGNOSIS — Z87891 Personal history of nicotine dependence: Secondary | ICD-10-CM | POA: Insufficient documentation

## 2019-09-18 DIAGNOSIS — F1010 Alcohol abuse, uncomplicated: Secondary | ICD-10-CM | POA: Insufficient documentation

## 2019-09-18 DIAGNOSIS — Z809 Family history of malignant neoplasm, unspecified: Secondary | ICD-10-CM | POA: Diagnosis not present

## 2019-09-18 DIAGNOSIS — E119 Type 2 diabetes mellitus without complications: Secondary | ICD-10-CM | POA: Diagnosis not present

## 2019-09-18 DIAGNOSIS — R233 Spontaneous ecchymoses: Secondary | ICD-10-CM

## 2019-09-18 DIAGNOSIS — F329 Major depressive disorder, single episode, unspecified: Secondary | ICD-10-CM | POA: Insufficient documentation

## 2019-09-18 DIAGNOSIS — K7030 Alcoholic cirrhosis of liver without ascites: Secondary | ICD-10-CM | POA: Diagnosis not present

## 2019-09-18 DIAGNOSIS — R69 Illness, unspecified: Secondary | ICD-10-CM | POA: Diagnosis not present

## 2019-09-18 DIAGNOSIS — Z89512 Acquired absence of left leg below knee: Secondary | ICD-10-CM

## 2019-09-18 DIAGNOSIS — Z7984 Long term (current) use of oral hypoglycemic drugs: Secondary | ICD-10-CM | POA: Diagnosis not present

## 2019-09-18 DIAGNOSIS — D696 Thrombocytopenia, unspecified: Secondary | ICD-10-CM

## 2019-09-18 DIAGNOSIS — I10 Essential (primary) hypertension: Secondary | ICD-10-CM | POA: Insufficient documentation

## 2019-09-18 LAB — CBC WITH DIFFERENTIAL (CANCER CENTER ONLY)
Abs Immature Granulocytes: 0.02 10*3/uL (ref 0.00–0.07)
Basophils Absolute: 0 10*3/uL (ref 0.0–0.1)
Basophils Relative: 0 %
Eosinophils Absolute: 0.1 10*3/uL (ref 0.0–0.5)
Eosinophils Relative: 1 %
HCT: 38.2 % — ABNORMAL LOW (ref 39.0–52.0)
Hemoglobin: 12.3 g/dL — ABNORMAL LOW (ref 13.0–17.0)
Immature Granulocytes: 0 %
Lymphocytes Relative: 21 %
Lymphs Abs: 1.3 10*3/uL (ref 0.7–4.0)
MCH: 32.1 pg (ref 26.0–34.0)
MCHC: 32.2 g/dL (ref 30.0–36.0)
MCV: 99.7 fL (ref 80.0–100.0)
Monocytes Absolute: 0.4 10*3/uL (ref 0.1–1.0)
Monocytes Relative: 7 %
Neutro Abs: 4.7 10*3/uL (ref 1.7–7.7)
Neutrophils Relative %: 71 %
Platelet Count: 167 10*3/uL (ref 150–400)
RBC: 3.83 MIL/uL — ABNORMAL LOW (ref 4.22–5.81)
RDW: 15 % (ref 11.5–15.5)
WBC Count: 6.5 10*3/uL (ref 4.0–10.5)
nRBC: 0 % (ref 0.0–0.2)

## 2019-09-18 LAB — CMP (CANCER CENTER ONLY)
ALT: 27 U/L (ref 0–44)
AST: 26 U/L (ref 15–41)
Albumin: 3.1 g/dL — ABNORMAL LOW (ref 3.5–5.0)
Alkaline Phosphatase: 145 U/L — ABNORMAL HIGH (ref 38–126)
Anion gap: 7 (ref 5–15)
BUN: 10 mg/dL (ref 8–23)
CO2: 24 mmol/L (ref 22–32)
Calcium: 8.4 mg/dL — ABNORMAL LOW (ref 8.9–10.3)
Chloride: 110 mmol/L (ref 98–111)
Creatinine: 0.8 mg/dL (ref 0.61–1.24)
GFR, Est AFR Am: >60 mL/min (ref >60)
GFR, Estimated: >60 mL/min (ref >60)
Glucose, Bld: 104 mg/dL — ABNORMAL HIGH (ref 70–99)
Potassium: 4 mmol/L (ref 3.5–5.1)
Sodium: 141 mmol/L (ref 135–145)
Total Bilirubin: 0.5 mg/dL (ref 0.3–1.2)
Total Protein: 6.6 g/dL (ref 6.5–8.1)

## 2019-09-18 LAB — IRON AND TIBC
Iron: 49 ug/dL (ref 42–163)
Saturation Ratios: 19 % — ABNORMAL LOW (ref 20–55)
TIBC: 264 ug/dL (ref 202–409)
UIBC: 215 ug/dL (ref 117–376)

## 2019-09-18 LAB — SAVE SMEAR(SSMR), FOR PROVIDER SLIDE REVIEW

## 2019-09-18 LAB — FOLATE: Folate: 11.2 ng/mL (ref >5.9)

## 2019-09-18 LAB — VITAMIN B12: Vitamin B-12: 509 pg/mL (ref 180–914)

## 2019-09-18 LAB — FERRITIN: Ferritin: 59 ng/mL (ref 24–336)

## 2019-09-18 LAB — LACTATE DEHYDROGENASE: LDH: 166 U/L (ref 98–192)

## 2019-09-18 MED ORDER — FOLIC ACID 1 MG PO TABS: 1.0000 mg | ORAL_TABLET | Freq: Every day | ORAL | 11 refills | Status: DC

## 2019-09-24 ENCOUNTER — Other Ambulatory Visit: Payer: Self-pay | Admitting: Neurology

## 2019-09-24 ENCOUNTER — Other Ambulatory Visit: Payer: Self-pay | Admitting: Family

## 2019-09-24 DIAGNOSIS — F32A Depression, unspecified: Secondary | ICD-10-CM

## 2019-09-24 DIAGNOSIS — G4700 Insomnia, unspecified: Secondary | ICD-10-CM

## 2019-09-24 DIAGNOSIS — R519 Headache, unspecified: Secondary | ICD-10-CM

## 2019-09-24 DIAGNOSIS — G8929 Other chronic pain: Secondary | ICD-10-CM

## 2019-09-24 DIAGNOSIS — F329 Major depressive disorder, single episode, unspecified: Secondary | ICD-10-CM

## 2019-09-24 NOTE — Telephone Encounter (Signed)
Pending medication refill and routing to provider due to warning associated with medication.

## 2019-09-26 ENCOUNTER — Ambulatory Visit: Payer: Medicare HMO | Admitting: Podiatry

## 2019-10-08 ENCOUNTER — Encounter: Payer: Self-pay | Admitting: Family

## 2019-10-08 ENCOUNTER — Ambulatory Visit (INDEPENDENT_AMBULATORY_CARE_PROVIDER_SITE_OTHER): Payer: Medicare HMO | Admitting: Family

## 2019-10-08 ENCOUNTER — Other Ambulatory Visit: Payer: Self-pay

## 2019-10-08 ENCOUNTER — Telehealth: Payer: Self-pay | Admitting: Pharmacy Technician

## 2019-10-08 VITALS — BP 120/78 | HR 80 | Temp 97.8°F | Wt 265.2 lb

## 2019-10-08 DIAGNOSIS — R768 Other specified abnormal immunological findings in serum: Secondary | ICD-10-CM | POA: Diagnosis not present

## 2019-10-08 NOTE — Patient Instructions (Signed)
Nice to meet you.  Your hepatitis C RNA (viral load) is undetectable indicating that there is no need for treatment.  It is likely that you have cleared your hepatitis C virus independently  In the future if you are needing hepatitis C screening you will need to use the hepatitis C RNA viral load test as the hepatitis C antibody test will always be positive.  Continue to follow-up with primary care  Have a great day and stay safe!  Happy new year!

## 2019-10-08 NOTE — Assessment & Plan Note (Signed)
Mr. Hollenberg is a 73 year old male with positive hepatitis C antibody testing and negative hepatitis C RNA viral load levels indicating likely self clearance of previous hepatitis C infection.  No treatment is indicated at the present time.  We discussed the pathophysiology, transmission, prevention, and treatment options for hepatitis C as well as the ability to become reinfected.  If future screening for hepatitis C is of concern, recommend hepatitis C RNA viral load level as hepatitis C antibody test will always remain positive.  We are happy to see him back as needed.

## 2019-10-08 NOTE — Progress Notes (Signed)
Subjective:    Patient ID: Darrell Horne, male    DOB: 1945-12-08, 73 y.o.   MRN: [id number]  Chief Complaint  Patient presents with   New Patient (Initial Visit)    no complaints;     HPI:  Darrell Horne is a 73 y.o. male with previous medical history of type 2 diabetes, hyperlipidemia, hypertension, paroxysmal atrial fibrillation anticoagulated on apixaban in the recent thrombocytopenia being followed by oncology presenting today for a positive hepatitis C antibody test.  Darrell Horne was recently evaluated at hematology/oncology for thrombocytopenia with likely cause being alcohol consumption and tested for hepatitis C with resulting positive hepatitis C antibody.  Hepatitis C RNA levels were undetectable.  Darrell Horne denies any history of IV drug use, blood transfusions prior to 1992, tattoos, or sharing of razors/toothbrushes.  He is in the high risk age group as he was born in [birth year].  Not currently experiencing any symptoms of nausea, vomiting, jaundice, or scleral icterus.  He has no previous treatment of hepatitis C in the past.  No personal or family history of liver disease.  Currently he drinks 2 to 3 glasses of wine per week on average and denies recreational or illicit drug use and is a former smoker.    No Known Allergies    Outpatient Medications Prior to Visit  Medication Sig Dispense Refill   amitriptyline (ELAVIL) 50 MG tablet TAKE 1 TABLET BY MOUTH EVERYDAY AT BEDTIME 90 tablet 1   apixaban (ELIQUIS) 5 MG TABS tablet Take 1 tablet (5 mg total) by mouth 2 (two) times daily. 180 tablet 1   atorvastatin (LIPITOR) 40 MG tablet Take one tablet by mouth once daily 90 tablet 1   b complex vitamins tablet Take 1 tablet by mouth daily.     folic acid (FOLVITE) 1 MG tablet Take 1 tablet (1 mg total) by mouth daily. 30 tablet 11   gabapentin (NEURONTIN) 600 MG tablet Take 1 tablet (600 mg total) by mouth 3 (three) times daily. 270 tablet 1   metFORMIN (GLUCOPHAGE-XR) 500 MG  24 hr tablet Take two tablets by mouth daily with supper 180 tablet 1   metoprolol succinate (TOPROL-XL) 50 MG 24 hr tablet TAKE 1 TABLET BY MOUTH DAILY WITH SUPPER. TAKE WITH OR IMMEDIATELY FOLLOWING A MEAL. 90 tablet 1   omeprazole (PRILOSEC) 20 MG capsule TAKE 1 CAPSULE BY MOUTH EVERY DAY 90 capsule 1   ONETOUCH VERIO test strip USE TO TEST BLOOD SUGAR TWICE DAILY DX: E11.42 200 strip 4   topiramate (TOPAMAX) 50 MG tablet TAKE 1 AND 1/2 TABLETS BY MOUTH DAILY 45 tablet 0   vitamin B-12 (CYANOCOBALAMIN) 1000 MCG tablet Take 1,000 mcg by mouth daily.     vitamin C (ASCORBIC ACID) 500 MG tablet Take 500 mg by mouth 2 (two) times daily.      Menthol (ICY HOT) 5 % PTCH Apply 1 patch topically as needed (back pain).     Tdap (ADACEL) 02-08-14.5 LF-MCG/0.5 injection Inject 0.5 mLs into the muscle once.     No facility-administered medications prior to visit.     Past Medical History:  Diagnosis Date   Chest pain    Diabetes mellitus    DM2 (diabetes mellitus, type 2) (HCC)    GERD (gastroesophageal reflux disease)    HLD (hyperlipidemia)    HTN (hypertension)    PAF (paroxysmal atrial fibrillation) (HCC)    Palpitations       Past Surgical History:  Procedure Laterality Date  APPENDECTOMY     CHOLECYSTECTOMY     COLONOSCOPY     Dr. Michail Sermon   COLONOSCOPY WITH PROPOFOL N/A 11/06/2018   Procedure: COLONOSCOPY WITH PROPOFOL;  Surgeon: Wilford Corner, MD;  [facility];  Service: Endoscopy;  Laterality: N/A;   HERNIA REPAIR     LEG AMPUTATION Left [id number]   Dr. Kendell Bane   POLYPECTOMY  11/06/2018   Procedure: POLYPECTOMY;  Surgeon: Wilford Corner, MD;  [facility];  Service: Endoscopy;;   SHOULDER SURGERY  [id number]   Dr. Meridee Score   TOTAL ANKLE ARTHROPLASTY  [id number]      Family History  Problem Relation Age of Onset   Heart disease Mother        Living   Stroke Father 110       Deceased   Diabetes Sister     Cancer Sister       Social History   Socioeconomic History   Marital status: Married    Spouse name: Not on file   Number of children: 4   Years of education: Not on file   Highest education level: Not on file  Occupational History   Occupation: retired Administrator  Tobacco Use   Smoking status: Former Smoker    Years: 12.00    Quit date: 10/10/1981    Years since quitting: 38.0   Smokeless tobacco: Never Used  Substance and Sexual Activity   Alcohol use: Yes    Comment: has glass of wine once a week but states rarely    Drug use: No   Sexual activity: Never  Other Topics Concern   Not on file  Social History Narrative   Diet:   Do you drink/eat things with caffeine?  Yes   Marital status:  Divorced.  What year were you married?  2001   Do you live in a house, assisted living, condo, apartment, trailer, etc.?  Apartment   Is it one or more stories?  1 story   How many persons live in your home?  1   Do you have any pets in your home?  No   Current or past profession:  Truck driver   Do you exercise?  Some  Type and how often:  Walking   Social Determinants of Health   Financial Resource Strain:    Difficulty of Paying Living Expenses: Not on file  Food Insecurity:    Worried About Charity fundraiser in the Last Year: Not on file   [employer] of Food in the Last Year: Not on file  Transportation Needs:    Lack of Transportation (Medical): Not on file   Lack of Transportation (Non-Medical): Not on file  Physical Activity:    Days of Exercise per Week: Not on file   Minutes of Exercise per Session: Not on file  Stress:    Feeling of Stress : Not on file  Social Connections:    Frequency of Communication with Friends and Family: Not on file   Frequency of Social Gatherings with Friends and Family: Not on file   Attends Religious Services: Not on file   Active Member of Clubs or Organizations: Not on file   Attends Archivist  Meetings: Not on file   Marital Status: Not on file  Intimate Partner Violence:    Fear of Current or Ex-Partner: Not on file   Emotionally Abused: Not on file   Physically Abused: Not on file   Sexually Abused: Not on file  Review of Systems  Constitutional: Negative for chills, diaphoresis, fatigue and fever.  Respiratory: Negative for cough, chest tightness, shortness of breath and wheezing.   Cardiovascular: Negative for chest pain.  Gastrointestinal: Negative for abdominal distention, abdominal pain, constipation, diarrhea, nausea and vomiting.  Neurological: Negative for weakness and headaches.  Hematological: Does not bruise/bleed easily.       Objective:    BP 120/78    Pulse 80    Temp 97.8 F (36.6 C)    Wt 265 lb 3.2 oz (120.3 kg)    BMI 38.05 kg/m  Nursing note and vital signs reviewed.  Physical Exam Constitutional:      General: He is not in acute distress.    Appearance: He is well-developed.  Cardiovascular:     Rate and Rhythm: Normal rate and regular rhythm.     Heart sounds: Normal heart sounds. No murmur. No friction rub. No gallop.   Pulmonary:     Effort: Pulmonary effort is normal. No respiratory distress.     Breath sounds: Normal breath sounds. No wheezing or rales.  Chest:     Chest wall: No tenderness.  Abdominal:     General: Bowel sounds are normal. There is no distension.     Palpations: Abdomen is soft. There is no mass.     Tenderness: There is no abdominal tenderness. There is no guarding or rebound.  Skin:    General: Skin is warm and dry.  Neurological:     Mental Status: He is alert and oriented to person, place, and time.  Psychiatric:        Behavior: Behavior normal.        Thought Content: Thought content normal.        Judgment: Judgment normal.         Assessment & Plan:   Patient Active Problem List   Diagnosis Date Noted   Positive hepatitis C antibody test 10/08/2019   Personal history of colonic  polyps 11/06/2018   DDD (degenerative disc disease), lumbar 08/17/2018   DDD (degenerative disc disease), cervical 08/17/2018   Hx of BKA, left ([place]) 11/11/2016   High risk medication use 11/11/2016   Syncope 04/17/2016   Diabetic polyneuropathy associated with type 2 diabetes mellitus ([place]) [id number]   Alcoholic peripheral neuropathy ([place]) 12/23/2015   Sensory ataxia 12/23/2015   Headache 11/04/2015   Chronic low back pain 11/04/2015   Neuropathy due to medical condition ([place]) 04/08/2015   Diabetes ([place]) 04/09/2014   PAF (paroxysmal atrial fibrillation) (HCC)    DM2 (diabetes mellitus, type 2) (HCC)    HTN (hypertension)    Palpitations    HLD (hyperlipidemia)      Problem List Items Addressed This Visit      Other   Positive hepatitis C antibody test - Primary    Mr. Degray is a 73 year old male with positive hepatitis C antibody testing and negative hepatitis C RNA viral load levels indicating likely self clearance of previous hepatitis C infection.  No treatment is indicated at the present time.  We discussed the pathophysiology, transmission, prevention, and treatment options for hepatitis C as well as the ability to become reinfected.  If future screening for hepatitis C is of concern, recommend hepatitis C RNA viral load level as hepatitis C antibody test will always remain positive.  We are happy to see him back as needed.          I have discontinued Horrace L. Olshefski's Tdap. I am also having him  maintain his b complex vitamins, vitamin C, Menthol, atorvastatin, gabapentin, metFORMIN, apixaban, metoprolol succinate, OneTouch Verio, vitamin 0000000, omeprazole, folic acid, topiramate, and amitriptyline.   Follow-up: As needed   Terri Piedra, MSN, FNP-C Nurse Practitioner [facility] [place] Office phone: [phone] Pager: [place] number: [phone]

## 2019-10-08 NOTE — Telephone Encounter (Signed)
[facility] Patient Advocate Encounter    Findings of the benefits investigation:   Insurance: Government social research officer- active  Prior Authorization: will begin insurance process once medication is prescribed  [facility] Patient Advocate will follow up once patient arrives for their appointment and obtain information to get the copayment amount covered.  Bartholomew Crews, CPhT Specialty Pharmacy Patient [facility] Phone: [phone] Fax: [phone] 10/08/2019 9:06 AM

## 2019-10-12 ENCOUNTER — Other Ambulatory Visit: Payer: Self-pay | Admitting: Family

## 2019-10-16 DIAGNOSIS — H16142 Punctate keratitis, left eye: Secondary | ICD-10-CM | POA: Diagnosis not present

## 2019-10-16 DIAGNOSIS — H353221 Exudative age-related macular degeneration, left eye, with active choroidal neovascularization: Secondary | ICD-10-CM | POA: Diagnosis not present

## 2019-10-16 DIAGNOSIS — H35052 Retinal neovascularization, unspecified, left eye: Secondary | ICD-10-CM | POA: Diagnosis not present

## 2019-10-16 DIAGNOSIS — E119 Type 2 diabetes mellitus without complications: Secondary | ICD-10-CM | POA: Diagnosis not present

## 2019-10-16 DIAGNOSIS — H04123 Dry eye syndrome of bilateral lacrimal glands: Secondary | ICD-10-CM | POA: Diagnosis not present

## 2019-10-16 DIAGNOSIS — Z961 Presence of intraocular lens: Secondary | ICD-10-CM | POA: Diagnosis not present

## 2019-10-16 DIAGNOSIS — H353111 Nonexudative age-related macular degeneration, right eye, early dry stage: Secondary | ICD-10-CM | POA: Diagnosis not present

## 2019-10-24 ENCOUNTER — Other Ambulatory Visit: Payer: Self-pay | Admitting: Neurology

## 2019-11-14 DIAGNOSIS — H3562 Retinal hemorrhage, left eye: Secondary | ICD-10-CM | POA: Diagnosis not present

## 2019-11-14 DIAGNOSIS — H43811 Vitreous degeneration, right eye: Secondary | ICD-10-CM | POA: Diagnosis not present

## 2019-11-14 DIAGNOSIS — H353221 Exudative age-related macular degeneration, left eye, with active choroidal neovascularization: Secondary | ICD-10-CM | POA: Diagnosis not present

## 2019-11-14 DIAGNOSIS — E119 Type 2 diabetes mellitus without complications: Secondary | ICD-10-CM | POA: Diagnosis not present

## 2019-12-16 DIAGNOSIS — H43812 Vitreous degeneration, left eye: Secondary | ICD-10-CM | POA: Diagnosis not present

## 2019-12-16 DIAGNOSIS — H3562 Retinal hemorrhage, left eye: Secondary | ICD-10-CM | POA: Diagnosis not present

## 2019-12-16 DIAGNOSIS — H353221 Exudative age-related macular degeneration, left eye, with active choroidal neovascularization: Secondary | ICD-10-CM | POA: Diagnosis not present

## 2019-12-25 ENCOUNTER — Ambulatory Visit: Payer: Medicare HMO | Admitting: Family

## 2019-12-26 ENCOUNTER — Ambulatory Visit: Payer: Medicare HMO | Admitting: Family

## 2020-01-08 ENCOUNTER — Other Ambulatory Visit: Payer: Self-pay | Admitting: Family

## 2020-01-08 DIAGNOSIS — I480 Paroxysmal atrial fibrillation: Secondary | ICD-10-CM

## 2020-01-14 ENCOUNTER — Other Ambulatory Visit: Payer: Self-pay | Admitting: Neurology

## 2020-01-16 ENCOUNTER — Encounter (INDEPENDENT_AMBULATORY_CARE_PROVIDER_SITE_OTHER): Payer: Medicare HMO | Admitting: Ophthalmology

## 2020-01-27 ENCOUNTER — Other Ambulatory Visit: Payer: Self-pay | Admitting: Family

## 2020-01-27 DIAGNOSIS — E1142 Type 2 diabetes mellitus with diabetic polyneuropathy: Secondary | ICD-10-CM

## 2020-01-30 ENCOUNTER — Other Ambulatory Visit: Payer: Self-pay | Admitting: Family

## 2020-02-03 ENCOUNTER — Encounter: Payer: Self-pay | Admitting: Family

## 2020-02-03 ENCOUNTER — Other Ambulatory Visit: Payer: Self-pay

## 2020-02-03 ENCOUNTER — Encounter: Payer: Medicare HMO | Admitting: Family

## 2020-02-03 NOTE — Progress Notes (Deleted)
This service is provided via telemedicine  No vital signs collected/recorded due to the encounter was a telemedicine visit.   Location of patient (ex: home, work):  Home  Patient consents to a telephone visit:  Yes  Location of the provider (ex: office, home):  [place] office  Name of any referring provider:  Marlowe Sax, NP  Names of all persons participating in the telemedicine service and their role in the encounter:  Marlowe Sax, NP; Bonney Leitz, [place]; Tawni Pummel  Time spent on call:  8 minutes

## 2020-02-03 NOTE — Progress Notes (Signed)
This encounter was created in error - please disregard.

## 2020-03-24 ENCOUNTER — Ambulatory Visit: Payer: Medicare HMO | Admitting: Family

## 2020-03-26 ENCOUNTER — Other Ambulatory Visit: Payer: Self-pay | Admitting: Neurology

## 2020-04-09 ENCOUNTER — Other Ambulatory Visit: Payer: Self-pay | Admitting: Family

## 2020-04-09 NOTE — Telephone Encounter (Signed)
rx sent to pharmacy by e-script  

## 2020-04-12 NOTE — Progress Notes (Signed)
Subjective:  Patient ID: Darrell Horne, male    DOB: 10/17/1945,  MRN: [id number]  Chief Complaint  Patient presents with  . Wound Check    Right lower extremity and right 2nd digit wound check. Pt denies fever/nausea/vomiting/chills and states he has some redness around the right 2nd digit.    74 y.o. male presents for wound care.  History above confirmed with patient.   Review of Systems: Negative except as noted in the HPI. Denies N/V/F/Ch.  Past Medical History:  Diagnosis Date  . Chest pain   . Diabetes mellitus   . DM2 (diabetes mellitus, type 2) ([place])   . GERD (gastroesophageal reflux disease)   . HLD (hyperlipidemia)   . HTN (hypertension)   . PAF (paroxysmal atrial fibrillation) ([place])   . Palpitations     Current Outpatient Medications:  .  b complex vitamins tablet, Take 1 tablet by mouth daily., Disp: , Rfl:  .  Menthol (ICY HOT) 5 % PTCH, Apply 1 patch topically as needed (back pain)., Disp: , Rfl:  .  metoprolol succinate (TOPROL-XL) 50 MG 24 hr tablet, TAKE 1 TABLET BY MOUTH DAILY WITH SUPPER. TAKE WITH OR IMMEDIATELY FOLLOWING A MEAL., Disp: 90 tablet, Rfl: 1 .  ONETOUCH VERIO test strip, USE TO TEST BLOOD SUGAR TWICE DAILY DX: E11.42, Disp: 200 strip, Rfl: 4 .  vitamin C (ASCORBIC ACID) 500 MG tablet, Take 500 mg by mouth 2 (two) times daily. , Disp: , Rfl:  .  amitriptyline (ELAVIL) 50 MG tablet, TAKE 1 TABLET BY MOUTH EVERYDAY AT BEDTIME, Disp: 90 tablet, Rfl: 1 .  atorvastatin (LIPITOR) 40 MG tablet, TAKE 1 TABLET BY MOUTH EVERY DAY, Disp: 90 tablet, Rfl: 1 .  ELIQUIS 5 MG TABS tablet, TAKE 1 TABLET BY MOUTH TWICE A DAY, Disp: 180 tablet, Rfl: 1 .  folic acid (FOLVITE) 1 MG tablet, Take 1 tablet (1 mg total) by mouth daily., Disp: 30 tablet, Rfl: 11 .  gabapentin (NEURONTIN) 600 MG tablet, TAKE 1 TABLET BY MOUTH THREE TIMES A DAY, Disp: 270 tablet, Rfl: 1 .  metFORMIN (GLUCOPHAGE-XR) 500 MG 24 hr tablet, TAKE TWO TABLETS BY MOUTH DAILY WITH SUPPER, Disp: 180  tablet, Rfl: 1 .  omeprazole (PRILOSEC) 20 MG capsule, TAKE 1 CAPSULE BY MOUTH EVERY DAY, Disp: 90 capsule, Rfl: 1 .  Tdap (BOOSTRIX) 5-2.5-18.5 LF-MCG/0.5 injection, Inject 0.5 mLs into the muscle once., Disp: , Rfl:  .  topiramate (TOPAMAX) 50 MG tablet, TAKE 1 AND 1/2 TABLETS DAILY BY MOUTH, Disp: 45 tablet, Rfl: 0 .  vitamin B-12 (CYANOCOBALAMIN) 1000 MCG tablet, Take 1,000 mcg by mouth daily., Disp: , Rfl:   Social History   Tobacco Use  Smoking Status Former Smoker  . Years: 12.00  . Quit date: 10/10/1981  . Years since quitting: 38.5  Smokeless Tobacco Never Used    No Known Allergies Objective:   There were no vitals filed for this visit. There is no height or weight on file to calculate BMI. Constitutional Well developed. Well nourished.  Vascular Dorsalis pedis faintly pulses palpable. Posterior tibial pulses faintly palpable. Capillary refill normal to all digits.  No cyanosis or clubbing noted. Right leg pitting edema Pedal hair growth abnormal.  Neurologic Normal speech. Oriented to person, place, and time. Protective sensation absent  Dermatologic Wounds hallux and right 2nd toe healed Multiple small venous leg ulcers right medial leg unstageable no warmth erythema signs of infeciton  Orthopedic: No pain to palpation right foot. Hx BKA  left   Radiographs: None Assessment:   1. Venous stasis ulcer of other part of right lower leg limited to breakdown of skin with varicose veins (HCC)    Plan:  Patient was evaluated and treated and all questions answered.  Ulcer right hallux, right 2nd toe -Healed today. No need to continue to dress.    Venous leg ulcers right leg -Multilayer compression dressing reaplied  Procedure: Multilayer Compression dressing Rationale: venous insufficiency Technique: Unna boot, cast padding, Coban compression dressing applied Disposition: Patient tolerated procedure well.    Return in about 1 month (around 09/21/2019) for  Wound Care, Right.

## 2020-05-19 ENCOUNTER — Other Ambulatory Visit: Payer: Self-pay

## 2020-05-19 ENCOUNTER — Ambulatory Visit: Payer: Medicare HMO | Admitting: Podiatry

## 2020-05-19 DIAGNOSIS — L97511 Non-pressure chronic ulcer of other part of right foot limited to breakdown of skin: Secondary | ICD-10-CM

## 2020-05-19 DIAGNOSIS — L97512 Non-pressure chronic ulcer of other part of right foot with fat layer exposed: Secondary | ICD-10-CM | POA: Diagnosis not present

## 2020-05-19 DIAGNOSIS — I83018 Varicose veins of right lower extremity with ulcer other part of lower leg: Secondary | ICD-10-CM | POA: Diagnosis not present

## 2020-05-19 DIAGNOSIS — L97811 Non-pressure chronic ulcer of other part of right lower leg limited to breakdown of skin: Secondary | ICD-10-CM | POA: Diagnosis not present

## 2020-05-19 NOTE — Progress Notes (Signed)
  Subjective:  Patient ID: Darrell Horne, male    DOB: March 20, 1946,  MRN: [id number]  Chief Complaint  Patient presents with  . Wound Check    R leg (chronic), R plantar forefoot, R plantar midfoot, all toes. Pt stated, "I've had the leg wound for over a year. No other doctor has seen me for it. It drains pus. The leg and foot have been swollen for 6 months. The wound was almost healed". Wife stated, "He falls a lot - he fell on concrete and dragged his foot and leg across the floor. I haven't seen pus from the foot. There isn't an odor. He walks barefoot all the time". No fever/chills/N&V per pt.  . Diabetes    Pt stated, "I don't check my sugar anymore. I have the stuff I need to do it. It was checked at my doctor's office. I think my A1c was 6.5". Pt has neuropathy.   74 y.o. male presents for wound care. Hx confirmed with patient.  Objective:  Physical Exam: Multiple right foot wounds: Wound right fifth metatarsal head measuring 2 x 2, right fifth metatarsal base measuring 2 x 2, right fifth toe measuring one by one, right fourth toe measuring 1.5 x 0.3, right third toe measuring 1 x 0.6 All wounds without warmth erythema signs of infection no probe to bone no fluctuance or crepitus  Anterior shin wound with blistered weeping lesions   Assessment:   1. Venous stasis ulcer of other part of right lower leg limited to breakdown of skin with varicose veins (HCC)   2. Ulcer of right second toe with fat layer exposed ([place])   3. Skin ulcer of right great toe, limited to breakdown of skin [facility])    Plan:  Patient was evaluated and treated and all questions answered.  Ulcer right foot, right leg venous stasis ulcer -Unna boot applied to the leg wound for reduction of edema and to promote healing.  Neosporin applied to the wounds to the toes. -Offload ulcer with surgical shoe -Surgical shoe dispensed -Surveillance x-rays at next visit  Procedure: Multilayer Compression dressing Rationale:  venous stasis ulcer right Technique: Unna boot, cast padding, Coban compression dressing applied Disposition: Patient tolerated procedure well.    Return for Wound Care, Right, with XRs.

## 2020-05-26 ENCOUNTER — Encounter: Payer: Self-pay | Admitting: Family

## 2020-06-02 ENCOUNTER — Ambulatory Visit: Payer: Medicare HMO | Admitting: Podiatry

## 2020-06-04 ENCOUNTER — Ambulatory Visit: Payer: Medicare HMO | Admitting: Physician Assistant

## 2020-06-04 ENCOUNTER — Encounter: Payer: Self-pay | Admitting: Orthopedic Surgery

## 2020-06-04 ENCOUNTER — Ambulatory Visit (INDEPENDENT_AMBULATORY_CARE_PROVIDER_SITE_OTHER): Payer: Medicare HMO

## 2020-06-04 VITALS — Ht 70.0 in | Wt 265.0 lb

## 2020-06-04 DIAGNOSIS — M25511 Pain in right shoulder: Secondary | ICD-10-CM

## 2020-06-04 MED ORDER — LIDOCAINE HCL 1 % IJ SOLN
5.0000 mL | INTRAMUSCULAR | Status: AC | PRN
  Administered 2020-06-04: 5 mL

## 2020-06-04 MED ORDER — METHYLPREDNISOLONE ACETATE 40 MG/ML IJ SUSP
40.0000 mg | INTRAMUSCULAR | Status: AC | PRN
  Administered 2020-06-04: 40 mg via INTRA_ARTICULAR

## 2020-06-04 NOTE — Progress Notes (Signed)
Office Visit Note   Patient: Darrell Horne           Date of Birth: 20-Jul-1946           MRN: [id number] Visit Date: 06/04/2020              Requested by: Sandrea Hughs, NP [address] [place],   [id number] PCP: Sandrea Hughs, NP  Chief Complaint  Patient presents with  . Right Shoulder - Pain    S/p fall about 3 weeks ago       HPI: This is a pleasant gentleman who is status post below-knee amputation and ambulates in a wheelchair.  Unfortunately he fell out of his wheelchair a few days ago.  He is complaining of right shoulder pain.  Assessment & Plan: Visit Diagnoses:  1. Acute pain of right shoulder     Plan: Findings consistent with exacerbation of AC arthritis and shoulder arthritis.  I did review the x-rays with Dr. Sharol Given he could not find any acute fractures.  May also have biceps findings proximally but no bruising associated with this.  He did agree to an injection today.  Will follow up in 3 weeks  Follow-Up Instructions: No follow-ups on file.   Ortho Exam  Patient is alert, oriented, no adenopathy, well-dressed, normal affect, normal respiratory effort. Focused exam of his shoulder there is no bruising no swelling no clinical abnormalities.  He does have forward flexion and good biceps strength.  He is tender over the [facility] joint.  He can internally rotate behind his back.  I can palpate some fullness consistent with muscle at the area of the muscle biceps insertion proximally.  Though this is not terribly painful.  Imaging: No results found. No images are attached to the encounter.  Labs: Lab Results  Component Value Date   HGBA1C 5.8 (H) 08/28/2019   HGBA1C 6.0 (H) 10/29/2018   HGBA1C 6.1 (H) 10/31/2017   ESRSEDRATE 21 07/16/2015   CRP 1.2 07/16/2015   REPTSTATUS 04/18/2016 FINAL 04/17/2016   CULT <10,000 COLONIES/mL INSIGNIFICANT GROWTH (A) 04/17/2016     Lab Results  Component Value Date   ALBUMIN 3.1 (L) 09/18/2019   ALBUMIN 3.6  11/11/2016   ALBUMIN 3.6 11/04/2015    Lab Results  Component Value Date   MG 1.9 06/20/2011   MG 1.7 06/19/2011   MG 1.9 02/25/2011   No results found for: VD25OH  No results found for: PREALBUMIN CBC EXTENDED Latest Ref Rng & Units 09/18/2019 08/28/2019 10/29/2018  WBC 4.0 - 10.5 K/uL 6.5 6.3 6.9  RBC 4.22 - 5.81 MIL/uL 3.83(L) 4.23 4.51  HGB 13.0 - 17.0 g/dL 12.3(L) 13.1(L) 13.9  HCT 39 - 52 % 38.2(L) 40.3 41.2  PLT 150 - 400 K/uL 167 84(L) 99(L)  NEUTROABS 1.7 - 7.7 K/uL 4.7 4,656 4,133  LYMPHSABS 0.7 - 4.0 K/uL 1.3 1,153 2,201     Body mass index is 38.02 kg/m.  Orders:  Orders Placed This Encounter  Procedures  . XR Shoulder Right   No orders of the defined types were placed in this encounter.    Procedures: Large Joint Inj: R subacromial bursa on 06/04/2020 11:51 AM Indications: diagnostic evaluation and pain Details: 22 G 1.5 in needle, posterior approach  Arthrogram: No  Medications: 5 mL lidocaine 1 %; 40 mg methylPREDNISolone acetate 40 MG/ML Outcome: tolerated well, no immediate complications Procedure, treatment alternatives, risks and benefits explained, specific risks discussed. Consent was given by the patient.  Clinical Data: No additional findings.  ROS:  All other systems negative, except as noted in the HPI. Review of Systems  Objective: Vital Signs: Ht 5\' 10"  (1.778 m)   Wt 265 lb (120.2 kg)   BMI 38.02 kg/m   Specialty Comments:  No specialty comments available.  PMFS History: Patient Active Problem List   Diagnosis Date Noted  . Positive hepatitis C antibody test 10/08/2019  . Personal history of colonic polyps 11/06/2018  . DDD (degenerative disc disease), lumbar 08/17/2018  . DDD (degenerative disc disease), cervical 08/17/2018  . Hx of BKA, left ([place]) 11/11/2016  . High risk medication use 11/11/2016  . Syncope 04/17/2016  . Diabetic polyneuropathy associated with type 2 diabetes mellitus ([place]) 12/23/2015  .  Alcoholic peripheral neuropathy ([place]) 12/23/2015  . Sensory ataxia 12/23/2015  . Headache 11/04/2015  . Chronic low back pain 11/04/2015  . Neuropathy due to medical condition ([place]) 04/08/2015  . Diabetes ([place]) 04/09/2014  . PAF (paroxysmal atrial fibrillation) ([place])   . DM2 (diabetes mellitus, type 2) ([place])   . HTN (hypertension)   . Palpitations   . HLD (hyperlipidemia)    Past Medical History:  Diagnosis Date  . Chest pain   . Diabetes mellitus   . DM2 (diabetes mellitus, type 2) ([place])   . GERD (gastroesophageal reflux disease)   . HLD (hyperlipidemia)   . HTN (hypertension)   . PAF (paroxysmal atrial fibrillation) ([place])   . Palpitations     Family History  Problem Relation Age of Onset  . Heart disease Mother        Living  . Stroke Father 15       Deceased  . Diabetes Sister   . Cancer Sister     Past Surgical History:  Procedure Laterality Date  . APPENDECTOMY    . CHOLECYSTECTOMY    . COLONOSCOPY     Dr. Michail Sermon  . COLONOSCOPY WITH PROPOFOL N/A 11/06/2018   Procedure: COLONOSCOPY WITH PROPOFOL;  Surgeon: Wilford Corner, MD;  [facility];  Service: Endoscopy;  Laterality: N/A;  . HERNIA REPAIR    . LEG AMPUTATION Left [id number]   Dr. Kendell Bane  . POLYPECTOMY  11/06/2018   Procedure: POLYPECTOMY;  Surgeon: Wilford Corner, MD;  [facility];  Service: Endoscopy;;  . SHOULDER SURGERY  [id number]   Dr. Meridee Score  . TOTAL ANKLE ARTHROPLASTY  [id number]   Social History   Occupational History  . Occupation: retired Nurse, mental health  . Smoking status: Former Smoker    Years: 12.00    Quit date: 10/10/1981    Years since quitting: 38.6  . Smokeless tobacco: Never Used  Vaping Use  . Vaping Use: Never used  Substance and Sexual Activity  . Alcohol use: Yes    Comment: has glass of wine once a week but states rarely   . Drug use: No  . Sexual activity: Never

## 2020-06-18 ENCOUNTER — Ambulatory Visit: Payer: Medicare HMO | Admitting: Physician Assistant

## 2020-07-28 ENCOUNTER — Other Ambulatory Visit: Payer: Self-pay | Admitting: Neurology

## 2020-08-07 ENCOUNTER — Other Ambulatory Visit: Payer: Self-pay

## 2020-08-07 ENCOUNTER — Encounter: Payer: Self-pay | Admitting: Family

## 2020-08-07 ENCOUNTER — Ambulatory Visit (INDEPENDENT_AMBULATORY_CARE_PROVIDER_SITE_OTHER): Payer: Medicare HMO | Admitting: Family

## 2020-08-07 VITALS — BP 98/60 | HR 98 | Temp 97.1°F | Resp 18 | Ht 70.0 in | Wt 242.8 lb

## 2020-08-07 DIAGNOSIS — E1142 Type 2 diabetes mellitus with diabetic polyneuropathy: Secondary | ICD-10-CM

## 2020-08-07 DIAGNOSIS — I480 Paroxysmal atrial fibrillation: Secondary | ICD-10-CM | POA: Diagnosis not present

## 2020-08-07 DIAGNOSIS — E538 Deficiency of other specified B group vitamins: Secondary | ICD-10-CM | POA: Diagnosis not present

## 2020-08-07 DIAGNOSIS — Z23 Encounter for immunization: Secondary | ICD-10-CM

## 2020-08-07 DIAGNOSIS — G4700 Insomnia, unspecified: Secondary | ICD-10-CM | POA: Diagnosis not present

## 2020-08-07 DIAGNOSIS — K219 Gastro-esophageal reflux disease without esophagitis: Secondary | ICD-10-CM | POA: Diagnosis not present

## 2020-08-07 DIAGNOSIS — R519 Headache, unspecified: Secondary | ICD-10-CM | POA: Diagnosis not present

## 2020-08-07 DIAGNOSIS — E782 Mixed hyperlipidemia: Secondary | ICD-10-CM

## 2020-08-07 DIAGNOSIS — I10 Essential (primary) hypertension: Secondary | ICD-10-CM

## 2020-08-07 DIAGNOSIS — R69 Illness, unspecified: Secondary | ICD-10-CM | POA: Diagnosis not present

## 2020-08-07 DIAGNOSIS — G8929 Other chronic pain: Secondary | ICD-10-CM

## 2020-08-07 DIAGNOSIS — F32A Depression, unspecified: Secondary | ICD-10-CM

## 2020-08-07 MED ORDER — METOPROLOL SUCCINATE ER 50 MG PO TB24: ORAL_TABLET | ORAL | 1 refills | Status: DC

## 2020-08-07 MED ORDER — AMITRIPTYLINE HCL 50 MG PO TABS: ORAL_TABLET | ORAL | 1 refills | Status: DC

## 2020-08-07 MED ORDER — TETANUS-DIPHTH-ACELL PERTUSSIS 5-2-15.5 LF-MCG/0.5 IM SUSP: 0.5000 mL | Freq: Once | INTRAMUSCULAR | 0 refills | Status: AC

## 2020-08-07 MED ORDER — ATORVASTATIN CALCIUM 40 MG PO TABS: 40.0000 mg | ORAL_TABLET | Freq: Every day | ORAL | 1 refills | Status: DC

## 2020-08-07 MED ORDER — METFORMIN HCL ER 500 MG PO TB24: ORAL_TABLET | ORAL | 1 refills | Status: DC

## 2020-08-07 MED ORDER — TOPIRAMATE 50 MG PO TABS
ORAL_TABLET | ORAL | 0 refills | Status: DC
Start: 2020-08-07 — End: 2020-09-01

## 2020-08-07 MED ORDER — GABAPENTIN 600 MG PO TABS: 600.0000 mg | ORAL_TABLET | Freq: Three times a day (TID) | ORAL | 1 refills | Status: DC

## 2020-08-07 NOTE — Progress Notes (Signed)
Provider: Marlowe Sax FNP-C   Hamna Asa, Nelda Bucks, NP  Patient Care Team: Conlin Brahm, Nelda Bucks, NP as PCP - General (Family Medicine) Clent Jacks, MD as Consulting Physician (Ophthalmology) Alda Berthold, DO as Consulting Physician (Neurology)  Extended Emergency Contact Information Primary Emergency Contact: Tutterow,Anna Address: [place], [place] [address] [address] Phone: [phone] Relation: Spouse Secondary Emergency Contact: Thomasenia Bottoms [place] Phone: [phone] Relation: Sister  Code Status: Full Code  Goals of care: Advanced Directive information Advanced Directives 08/07/2020  Does Patient Have a Medical Advance Directive? Yes  Type of Advance Directive Living will;Healthcare Power of Attorney  Does patient want to make changes to medical advance directive? No - Patient declined  Copy of [place] in Chart? Yes - validated most recent copy scanned in chart (See row information)  Would patient like information on creating a medical advance directive? -     Chief Complaint  Patient presents with  . Medical Management of Chronic Issues    7 Month Follow Up.  Marland Kitchen Health Maintenance    Discuss the need for Urine Microalbumin, Eye Exam, and Hemoglobin A1C  . Immunizations    Dicsuss the need for Tetanus Vaccine, Influenza Vaccine, and Covid Vaccine.     HPI:  Pt is a 74 y.o. male seen today for 7 months follow up for medical management of chronic diseases. He denies any acute issues during visit.He is here with wive. He is due for his influenza vaccine.He would like to get influenza vaccine today.He denies any URI symptoms. He has completed his COVID-19 vaccine awaiting booster vaccine.   Hypertension - No home B/p reading for review.continues on metoprolol.He denies any symptoms of hypotension.  Type 2 DM - states CBG readings in the 130's.No log for review during visit.currently on  metformin 500 mg 24 Hr tablet daily.  He is due for annual eye exam.Has upcoming appointment with Ophthalmology.He continues to follow up with Podiatrist Dr. Hardie Pulley for venous stasis ulcer of the right leg.las seen 05/19/2020.  On Gabapentin 600 mg tablet three times for Neuropathy pain.   Hyperlipidemia - on atorvastatin 40 mg tablet daily.He is fasting today will obtain fasting lab worker.Has lost weight states eating well.   Migraine - chronic.Reguest refills for Topamax states effective for his migraine.   Past Medical History:  Diagnosis Date  . Chest pain   . Diabetes mellitus   . DM2 (diabetes mellitus, type 2) ([place])   . GERD (gastroesophageal reflux disease)   . HLD (hyperlipidemia)   . HTN (hypertension)   . PAF (paroxysmal atrial fibrillation) ([place])   . Palpitations    Past Surgical History:  Procedure Laterality Date  . APPENDECTOMY    . CHOLECYSTECTOMY    . COLONOSCOPY     Dr. Michail Sermon  . COLONOSCOPY WITH PROPOFOL N/A 11/06/2018   Procedure: COLONOSCOPY WITH PROPOFOL;  Surgeon: Wilford Corner, MD;  Location: [facility];  Service: Endoscopy;  Laterality: N/A;  . HERNIA REPAIR    . LEG AMPUTATION Left [id number]   Dr. Kendell Bane  . POLYPECTOMY  11/06/2018   Procedure: POLYPECTOMY;  Surgeon: Wilford Corner, MD;  Location: [facility];  Service: Endoscopy;;  . SHOULDER SURGERY  [id number]   Dr. Meridee Score  . TOTAL ANKLE ARTHROPLASTY  [id number]    No Known Allergies  Allergies as of 08/07/2020   No Known Allergies     Medication  List       Accurate as of August 07, 2020  9:53 AM. If you have any questions, ask your nurse or doctor.        STOP taking these medications   Tdap 5-2.5-18.5 LF-MCG/0.5 injection Commonly known as: BOOSTRIX Stopped by: Sandrea Hughs, NP     TAKE these medications   amitriptyline 50 MG tablet Commonly known as: ELAVIL TAKE 1 TABLET BY MOUTH EVERYDAY AT BEDTIME   atorvastatin 40 MG tablet Commonly known as:  LIPITOR TAKE 1 TABLET BY MOUTH EVERY DAY   b complex vitamins tablet Take 1 tablet by mouth daily.   Eliquis 5 MG Tabs tablet Generic drug: apixaban TAKE 1 TABLET BY MOUTH TWICE A DAY   folic acid 1 MG tablet Commonly known as: FOLVITE Take 1 tablet (1 mg total) by mouth daily.   gabapentin 600 MG tablet Commonly known as: NEURONTIN TAKE 1 TABLET BY MOUTH THREE TIMES A DAY   Icy Hot 5 % Ptch Generic drug: Menthol Apply 1 patch topically as needed (back pain).   metFORMIN 500 MG 24 hr tablet Commonly known as: GLUCOPHAGE-XR TAKE TWO TABLETS BY MOUTH DAILY WITH SUPPER   metoprolol succinate 50 MG 24 hr tablet Commonly known as: TOPROL-XL TAKE 1 TABLET BY MOUTH DAILY WITH SUPPER. TAKE WITH OR IMMEDIATELY FOLLOWING A MEAL.   omeprazole 20 MG capsule Commonly known as: PRILOSEC TAKE 1 CAPSULE BY MOUTH EVERY DAY   OneTouch Verio test strip Generic drug: glucose blood USE TO TEST BLOOD SUGAR TWICE DAILY DX: E11.42   topiramate 50 MG tablet Commonly known as: TOPAMAX TAKE 1 AND 1/2 TABLETS DAILY BY MOUTH   vitamin B-12 1000 MCG tablet Commonly known as: CYANOCOBALAMIN Take 1,000 mcg by mouth daily.   vitamin C 500 MG tablet Commonly known as: ASCORBIC ACID Take 500 mg by mouth daily.       Review of Systems  Constitutional: Negative for appetite change, chills, fatigue and fever.  HENT: Positive for hearing loss. Negative for congestion, rhinorrhea, sinus pressure, sinus pain, sneezing, sore throat and trouble swallowing.   Eyes: Negative for discharge, redness, itching and visual disturbance.  Respiratory: Negative for cough, chest tightness, shortness of breath and wheezing.   Cardiovascular: Negative for chest pain, palpitations and leg swelling.  Gastrointestinal: Negative for abdominal distention, abdominal pain, constipation, diarrhea, nausea and vomiting.  Endocrine: Negative for cold intolerance, heat intolerance, polydipsia, polyphagia and polyuria.   Genitourinary: Negative for difficulty urinating, dysuria, frequency and urgency.  Musculoskeletal: Positive for arthralgias and gait problem. Negative for myalgias.       Right shoulder   Skin: Negative for color change, pallor and rash.  Neurological: Negative for dizziness, speech difficulty, weakness, light-headedness and headaches.  Hematological: Does not bruise/bleed easily.  Psychiatric/Behavioral: Negative for agitation, confusion and sleep disturbance. The patient is not nervous/anxious.     Immunization History  Administered Date(s) Administered  . DTaP 10/10/2014  . Fluad Quad(high Dose 65+) 08/28/2019  . Influenza,inj,Quad PF,6+ Mos 08/05/2016  . Influenza-Unspecified 10/10/2014  . PFIZER SARS-COV-2 Vaccination 01/28/2020, 02/18/2020  . Pneumococcal Conjugate-13 11/11/2016  . Pneumococcal-Unspecified 10/10/2014   Pertinent  Health Maintenance Due  Topic Date Due  . URINE MICROALBUMIN  10/30/2019  . HEMOGLOBIN A1C  02/25/2020  . INFLUENZA VACCINE  05/10/2020  . OPHTHALMOLOGY EXAM  05/29/2020  . FOOT EXAM  08/21/2020  . COLONOSCOPY  11/06/2028  . PNA vac Low Risk Adult  Completed   Fall Risk  10/08/2019 08/28/2019 02/05/2019 01/29/2019  10/29/2018  Falls in the past year? 0 1 0 0 0  Number falls in past yr: - 0 0 0 0  Injury with Fall? - 0 0 0 0  Comment - - - - -  Risk Factor Category  - - - - -  Risk for fall due to : Impaired balance/gait;Impaired mobility - - - -  Follow up Falls evaluation completed;Education provided;Falls prevention discussed - - - -   Functional Status Survey:    Vitals:   08/07/20 0932  BP: 98/60  Pulse: 98  Resp: 18  Temp: (!) 97.1 F (36.2 C)  SpO2: 99%  Weight: 242 lb 12.8 oz (110.1 kg)  Height: 5\' 10"  (1.778 m)   Body mass index is 34.84 kg/m. Physical Exam Vitals reviewed.  Constitutional:      General: He is not in acute distress.    Appearance: He is overweight. He is not ill-appearing.  HENT:     Head:  Normocephalic.     Right Ear: Tympanic membrane, ear canal and external ear normal. There is no impacted cerumen.     Left Ear: Tympanic membrane, ear canal and external ear normal. There is no impacted cerumen.     Nose: Nose normal. No congestion or rhinorrhea.     Mouth/Throat:     Mouth: Mucous membranes are moist.     Pharynx: Oropharynx is clear. No oropharyngeal exudate or posterior oropharyngeal erythema.  Eyes:     General: No scleral icterus.       Right eye: No discharge.        Left eye: No discharge.     Extraocular Movements: Extraocular movements intact.     Conjunctiva/sclera: Conjunctivae normal.     Pupils: Pupils are equal, round, and reactive to light.  Neck:     Vascular: No carotid bruit.  Cardiovascular:     Rate and Rhythm: Normal rate and regular rhythm.     Pulses: Normal pulses.     Heart sounds: Normal heart sounds. No murmur heard.  No friction rub. No gallop.   Pulmonary:     Effort: Pulmonary effort is normal. No respiratory distress.     Breath sounds: Normal breath sounds. No wheezing, rhonchi or rales.  Chest:     Chest wall: No tenderness.  Abdominal:     General: Bowel sounds are normal. There is no distension.     Palpations: Abdomen is soft. There is no mass.     Tenderness: There is no abdominal tenderness. There is no right CVA tenderness, left CVA tenderness, guarding or rebound.  Musculoskeletal:        General: No swelling or tenderness.     Cervical back: Normal range of motion. No rigidity or tenderness.     Right lower leg: Edema present.     Left lower leg: No edema.     Comments: Right leg trace - 1+ edema      Left Lower Extremity: Left leg is amputated below knee.  Lymphadenopathy:     Cervical: No cervical adenopathy.  Skin:    General: Skin is warm and dry.     Coloration: Skin is not pale.     Findings: No bruising, erythema, lesion or rash.  Neurological:     Mental Status: He is alert. Mental status is at baseline.      Cranial Nerves: No cranial nerve deficit.     Motor: No weakness.     Coordination: Coordination normal.  Gait: Gait abnormal.  Psychiatric:        Mood and Affect: Mood normal.        Speech: Speech normal.        Behavior: Behavior normal.        Thought Content: Thought content normal.        Cognition and Memory: Memory is impaired.    Labs reviewed: Recent Labs    08/28/19 1134 09/18/19 1003  NA 140 141  K 4.3 4.0  CL 105 110  CO2 27 24  GLUCOSE 100* 104*  BUN 9 10  CREATININE 0.79 0.80  CALCIUM 8.6 8.4*   Recent Labs    08/28/19 1134 09/18/19 1003  AST 19 26  ALT 15 27  ALKPHOS  --  145*  BILITOT 0.6 0.5  PROT 6.4 6.6  ALBUMIN  --  3.1*   Recent Labs    08/28/19 1134 09/18/19 1003  WBC 6.3 6.5  NEUTROABS 4,656 4.7  HGB 13.1* 12.3*  HCT 40.3 38.2*  MCV 95.3 99.7  PLT 84* 167   Lab Results  Component Value Date   TSH 1.70 08/28/2019   Lab Results  Component Value Date   HGBA1C 5.8 (H) 08/28/2019   Lab Results  Component Value Date   CHOL 138 08/28/2019   HDL 68 08/28/2019   LDLCALC 56 08/28/2019   TRIG 59 08/28/2019   CHOLHDL 2.0 08/28/2019    Significant Diagnostic Results in last 30 days:  No results found.  Assessment/Plan 1. Type 2 diabetes mellitus with diabetic polyneuropathy, unspecified whether long term insulin use (HCC) Lab Results  Component Value Date   HGBA1C 5.8 (H) 08/28/2019  Reports CBG in the 130's no log brought in today. Continue on metformin  - Influenza vaccine administered by CMA today  - annual foot exam up to date  - follow up with Opthalmology as scheduled.  - continue on Statin  - will order urine MicroAlbumin today  2. Essential hypertension B/p at goal  Continue on metoprolol CBC/diff,CMP   3. PAF (paroxysmal atrial fibrillation) (HCC) HR controlled. Continue on EliQuis  Check CBC/diff   4. Gastroesophageal reflux disease without esophagitis Symptoms under controlled. Continue on  Omeprazole  Will continue to monitor H/H   5. Depression, unspecified depression type Mood stable. Continue on Amitriptyline   6. Mixed hyperlipidemia Continue on atorvastatin  Lipid Panel   7. Vitamin B12 deficiency Continue on vitamin B12 supplement  Will obtain Vitamin B12 level   Family/ staff Communication: Reviewed plan of care with patient and wife.  Labs/tests ordered: CBC/diff,CMP,Lipid panel and Vitamin B12 level   Next Appointment : 6 months for medical management of chronic issues.   Sandrea Hughs, NP

## 2020-08-08 LAB — CBC WITH DIFFERENTIAL/PLATELET
Absolute Monocytes: 408 cells/uL (ref 200–950)
Basophils Absolute: 10 cells/uL (ref 0–200)
Basophils Relative: 0.2 %
Eosinophils Absolute: 58 cells/uL (ref 15–500)
Eosinophils Relative: 1.2 %
HCT: 36 % — ABNORMAL LOW (ref 38.5–50.0)
Hemoglobin: 12.3 g/dL — ABNORMAL LOW (ref 13.2–17.1)
Lymphs Abs: 1330 cells/uL (ref 850–3900)
MCH: 34.1 pg — ABNORMAL HIGH (ref 27.0–33.0)
MCHC: 34.2 g/dL (ref 32.0–36.0)
MCV: 99.7 fL (ref 80.0–100.0)
MPV: 12.1 fL (ref 7.5–12.5)
Monocytes Relative: 8.5 %
Neutro Abs: 2995 cells/uL (ref 1500–7800)
Neutrophils Relative %: 62.4 %
Platelets: 71 10*3/uL — ABNORMAL LOW (ref 140–400)
RBC: 3.61 10*6/uL — ABNORMAL LOW (ref 4.20–5.80)
RDW: 12.7 % (ref 11.0–15.0)
Total Lymphocyte: 27.7 %
WBC: 4.8 10*3/uL (ref 3.8–10.8)

## 2020-08-08 LAB — COMPLETE METABOLIC PANEL WITH GFR
AG Ratio: 1 (calc) (ref 1.0–2.5)
ALT: 11 U/L (ref 9–46)
AST: 20 U/L (ref 10–35)
Albumin: 2.8 g/dL — ABNORMAL LOW (ref 3.6–5.1)
Alkaline phosphatase (APISO): 111 U/L (ref 35–144)
BUN/Creatinine Ratio: 10 (calc) (ref 6–22)
BUN: 5 mg/dL — ABNORMAL LOW (ref 7–25)
CO2: 28 mmol/L (ref 20–32)
Calcium: 8.4 mg/dL — ABNORMAL LOW (ref 8.6–10.3)
Chloride: 107 mmol/L (ref 98–110)
Creat: 0.48 mg/dL — ABNORMAL LOW (ref 0.70–1.18)
GFR, Est African American: 126 mL/min/{1.73_m2} (ref >=60)
GFR, Est Non African American: 109 mL/min/{1.73_m2} (ref >=60)
Globulin: 2.9 g/dL (calc) (ref 1.9–3.7)
Glucose, Bld: 92 mg/dL (ref 65–99)
Potassium: 3.9 mmol/L (ref 3.5–5.3)
Sodium: 140 mmol/L (ref 135–146)
Total Bilirubin: 0.9 mg/dL (ref 0.2–1.2)
Total Protein: 5.7 g/dL — ABNORMAL LOW (ref 6.1–8.1)

## 2020-08-08 LAB — LIPID PANEL
Cholesterol: 89 mg/dL (ref <200)
HDL: 40 mg/dL (ref >=40)
LDL Cholesterol (Calc): 35 mg/dL (calc)
Non-HDL Cholesterol (Calc): 49 mg/dL (calc) (ref <130)
Total CHOL/HDL Ratio: 2.2 (calc) (ref <5.0)
Triglycerides: 60 mg/dL (ref <150)

## 2020-08-08 LAB — TSH: TSH: 1.38 mIU/L (ref 0.40–4.50)

## 2020-08-08 LAB — VITAMIN B12: Vitamin B-12: 693 pg/mL (ref 200–1100)

## 2020-08-11 DIAGNOSIS — E782 Mixed hyperlipidemia: Secondary | ICD-10-CM | POA: Diagnosis not present

## 2020-08-11 DIAGNOSIS — E1142 Type 2 diabetes mellitus with diabetic polyneuropathy: Secondary | ICD-10-CM | POA: Diagnosis not present

## 2020-08-11 DIAGNOSIS — I10 Essential (primary) hypertension: Secondary | ICD-10-CM | POA: Diagnosis not present

## 2020-08-12 ENCOUNTER — Telehealth: Payer: Self-pay

## 2020-08-12 DIAGNOSIS — E1142 Type 2 diabetes mellitus with diabetic polyneuropathy: Secondary | ICD-10-CM

## 2020-08-12 LAB — MICROALBUMIN / CREATININE URINE RATIO
Creatinine, Urine: 61 mg/dL (ref 20–320)
Microalb Creat Ratio: 3 mcg/mg creat (ref <30)
Microalb, Ur: 0.2 mg/dL

## 2020-08-12 NOTE — Telephone Encounter (Addendum)
Called and left VM for patient to call back. Trying to schedule an AWV for Darrell Horne.   Patient called back in regards to a result and his appt for AWV was scheduled at that time.

## 2020-08-13 ENCOUNTER — Other Ambulatory Visit: Payer: Self-pay

## 2020-08-13 DIAGNOSIS — E1142 Type 2 diabetes mellitus with diabetic polyneuropathy: Secondary | ICD-10-CM

## 2020-08-13 MED ORDER — RAMIPRIL 1.25 MG PO CAPS: 1.2500 mg | ORAL_CAPSULE | Freq: Every day | ORAL | 0 refills | Status: DC

## 2020-08-13 MED ORDER — RAMIPRIL 2.5 MG PO CAPS: 2.5000 mg | ORAL_CAPSULE | Freq: Every day | ORAL | 0 refills | Status: DC

## 2020-08-17 ENCOUNTER — Telehealth: Payer: Self-pay | Admitting: *Deleted

## 2020-08-17 NOTE — Telephone Encounter (Signed)
Received fax from [facility] for Prior Authorization for Amitriptyline.  Filled out Prior Auth form and placed in Dinah's folder to review and sign.   To be faxed back to [facility] once completed to Fax: [phone]

## 2020-08-21 NOTE — Telephone Encounter (Signed)
Received fax from [employer]. Amitriptyline APPROVED 10/11/19-10/09/20  Member ID: [id number]

## 2020-08-26 ENCOUNTER — Encounter: Payer: Medicare HMO | Admitting: Family

## 2020-08-26 ENCOUNTER — Other Ambulatory Visit: Payer: Self-pay

## 2020-08-26 ENCOUNTER — Telehealth: Payer: Self-pay

## 2020-08-26 NOTE — Telephone Encounter (Signed)
3 unsuccessful attempts to call patient and being visit at times 9:50am, 10:13am, and 10:17am. Patient was left voicemail on each attempt and notified to reschedule appointment.

## 2020-09-01 ENCOUNTER — Other Ambulatory Visit: Payer: Self-pay | Admitting: Family

## 2020-10-23 ENCOUNTER — Other Ambulatory Visit: Payer: Self-pay | Admitting: *Deleted

## 2020-10-23 ENCOUNTER — Other Ambulatory Visit: Payer: Self-pay | Admitting: Oncology

## 2020-10-23 DIAGNOSIS — D696 Thrombocytopenia, unspecified: Secondary | ICD-10-CM

## 2020-10-23 DIAGNOSIS — B182 Chronic viral hepatitis C: Secondary | ICD-10-CM

## 2020-10-23 MED ORDER — FOLIC ACID 1 MG PO TABS: 1.0000 mg | ORAL_TABLET | Freq: Every day | ORAL | 1 refills | Status: DC

## 2020-10-23 NOTE — Telephone Encounter (Signed)
[facility] requesting refill via fax. Oncology Prescribed Rx for patient a year ago.  Pended Rx and sent to [facility] for approval.

## 2020-11-11 ENCOUNTER — Other Ambulatory Visit: Payer: Self-pay | Admitting: Family

## 2020-11-11 DIAGNOSIS — E1142 Type 2 diabetes mellitus with diabetic polyneuropathy: Secondary | ICD-10-CM

## 2020-11-24 ENCOUNTER — Other Ambulatory Visit: Payer: Self-pay

## 2020-11-26 ENCOUNTER — Other Ambulatory Visit: Payer: Self-pay

## 2020-11-26 ENCOUNTER — Ambulatory Visit: Payer: Medicare HMO | Admitting: Family

## 2020-11-26 ENCOUNTER — Encounter: Payer: Self-pay | Admitting: Family

## 2020-11-26 VITALS — BP 130/84 | HR 80 | Temp 97.5°F | Resp 16 | Ht 70.0 in | Wt 219.2 lb

## 2020-11-26 DIAGNOSIS — I10 Essential (primary) hypertension: Secondary | ICD-10-CM

## 2020-11-26 DIAGNOSIS — M542 Cervicalgia: Secondary | ICD-10-CM | POA: Diagnosis not present

## 2020-11-26 DIAGNOSIS — Z23 Encounter for immunization: Secondary | ICD-10-CM

## 2020-11-26 DIAGNOSIS — E1142 Type 2 diabetes mellitus with diabetic polyneuropathy: Secondary | ICD-10-CM

## 2020-11-26 DIAGNOSIS — R519 Headache, unspecified: Secondary | ICD-10-CM

## 2020-11-26 DIAGNOSIS — G8929 Other chronic pain: Secondary | ICD-10-CM

## 2020-11-26 DIAGNOSIS — E782 Mixed hyperlipidemia: Secondary | ICD-10-CM

## 2020-11-26 DIAGNOSIS — I480 Paroxysmal atrial fibrillation: Secondary | ICD-10-CM

## 2020-11-26 MED ORDER — TETANUS-DIPHTH-ACELL PERTUSSIS 5-2.5-18.5 LF-MCG/0.5 IM SUSP: 0.5000 mL | Freq: Once | INTRAMUSCULAR | 0 refills | Status: AC

## 2020-11-26 MED ORDER — TOPIRAMATE 50 MG PO TABS: ORAL_TABLET | ORAL | 2 refills | Status: DC

## 2020-11-26 NOTE — Progress Notes (Signed)
Provider: Marlowe Sax FNP-C   Tyler Robidoux, Nelda Bucks, NP  Patient Care Team: Jonael Paradiso, Nelda Bucks, NP as PCP - General (Family Medicine) Clent Jacks, MD as Consulting Physician (Ophthalmology) Alda Berthold, DO as Consulting Physician (Neurology)  Extended Emergency Contact Information Primary Emergency Contact: Bazinet,Anna Address: [address]          [place], [place] [address] Johnnette Litter of [place] Phone: [phone] Relation: Spouse Secondary Emergency Contact: Thomasenia Bottoms [place] of [place] Phone: [phone] Relation: Sister   Code Status: Full Code  Goals of care: Advanced Directive information Advanced Directives 11/26/2020  Does Patient Have a Medical Advance Directive? Yes  Type of Paramedic of [place];Living will  Does patient want to make changes to medical advance directive? No - Patient declined  Copy of [place] in Chart? Yes - validated most recent copy scanned in chart (See row information)  Would patient like information on creating a medical advance directive? -     Chief Complaint  Patient presents with  . Medical Management of Chronic Issues    4 month follow up.  Marland Kitchen Health Maintenance    Discuss the need for Eye Exam, Foot Exam, and Hemoglobin A1C.  . Immunizations    Discuss the need for Tetanus Vaccine, and Covid Booster.    HPI:  Pt is a 75 y.o. male seen today for medical management of chronic diseases.He is here with spouse.He complains of worsening neck pain headache.tend to hold neck down all the time.No recent trauma or injuries to neck.  Chronic Neuropathy on hands has not worsen.   Type 2 DM - Not checking blood sugars at home.he denies any signs of hypoglycemia.Has upcoming appointment for Podiatrist tomorrow 11/27/2020 and eye exam on 12/10/2020. Inquires need for COVID-19 booster vaccine.discussed with him and wife that he is high risk due to his Comorbidity with Hypertensin,  DM 2,Advance age,obesity among others.  Hypertension - No home blood pressure for review. B/p today is within normal range.no dizziness,chest pain,palpitation or shortness of breath.   GERD - symptoms under control on Omeprazole.  AFib - request trulicity injection for AFib.discusse with him that Trulicity is not indicated for AFib but DM 2.Spouse also urged him to continue on current Eliquis. No palpitation,fatigue,chest pain or shortness of breath.   Headache - intermittent.request Topamax refilled states helps with the headaches.     Past Medical History:  Diagnosis Date  . Chest pain   . Diabetes mellitus   . DM2 (diabetes mellitus, type 2) ([employer])   . GERD (gastroesophageal reflux disease)   . HLD (hyperlipidemia)   . HTN (hypertension)   . PAF (paroxysmal atrial fibrillation) ([place])   . Palpitations    Past Surgical History:  Procedure Laterality Date  . APPENDECTOMY    . CHOLECYSTECTOMY    . COLONOSCOPY     Dr. Michail Sermon  . COLONOSCOPY WITH PROPOFOL N/A 11/06/2018   Procedure: COLONOSCOPY WITH PROPOFOL;  Surgeon: Wilford Corner, MD;  Location: [facility];  Service: Endoscopy;  Laterality: N/A;  . HERNIA REPAIR    . LEG AMPUTATION Left [id number]   Dr. Kendell Bane  . POLYPECTOMY  11/06/2018   Procedure: POLYPECTOMY;  Surgeon: Wilford Corner, MD;  Location: [facility];  Service: Endoscopy;;  . SHOULDER SURGERY  [id number]   Dr. Meridee Score  . TOTAL ANKLE ARTHROPLASTY  [id number]    No Known Allergies  Allergies as of 11/26/2020   No Known Allergies     Medication  List       Accurate as of November 26, 2020 10:36 AM. If you have any questions, ask your nurse or doctor.        amitriptyline 50 MG tablet Commonly known as: ELAVIL TAKE 1 TABLET BY MOUTH EVERYDAY AT BEDTIME   atorvastatin 40 MG tablet Commonly known as: LIPITOR Take 1 tablet (40 mg total) by mouth daily.   b complex vitamins tablet Take 1 tablet by mouth daily.   Eliquis 5 MG Tabs  tablet Generic drug: apixaban TAKE 1 TABLET BY MOUTH TWICE A DAY   folic acid 1 MG tablet Commonly known as: FOLVITE Take 1 tablet (1 mg total) by mouth daily.   gabapentin 600 MG tablet Commonly known as: NEURONTIN Take 1 tablet (600 mg total) by mouth 3 (three) times daily.   Menthol 5 % Ptch Apply 1 patch topically as needed (back pain).   metFORMIN 500 MG 24 hr tablet Commonly known as: GLUCOPHAGE-XR Take two Tablets by mouth daily with supper   metoprolol succinate 50 MG 24 hr tablet Commonly known as: TOPROL-XL TAKE 1 TABLET BY MOUTH DAILY WITH SUPPER. TAKE WITH OR IMMEDIATELY FOLLOWING A MEAL.   omeprazole 20 MG capsule Commonly known as: PRILOSEC TAKE 1 CAPSULE BY MOUTH EVERY DAY   OneTouch Verio test strip Generic drug: glucose blood USE TO TEST BLOOD SUGAR TWICE DAILY DX: E11.42   ramipril 1.25 MG capsule Commonly known as: ALTACE TAKE 1 CAPSULE BY MOUTH DAILY.   topiramate 50 MG tablet Commonly known as: TOPAMAX TAKE 1 AND 1/2 TABLETS BY MOUTH DAILY   vitamin B-12 1000 MCG tablet Commonly known as: CYANOCOBALAMIN Take 1,000 mcg by mouth daily.   vitamin C 500 MG tablet Commonly known as: ASCORBIC ACID Take 500 mg by mouth daily.       Review of Systems  Constitutional: Negative for appetite change, chills, fatigue and fever.  HENT: Negative for congestion, rhinorrhea, sinus pressure, sinus pain, sneezing and sore throat.   Eyes: Negative for discharge, redness, itching and visual disturbance.  Respiratory: Negative for cough, chest tightness, shortness of breath and wheezing.   Cardiovascular: Negative for chest pain, palpitations and leg swelling.  Gastrointestinal: Negative for abdominal distention, abdominal pain, constipation, diarrhea, nausea and vomiting.  Endocrine: Negative for cold intolerance, heat intolerance, polydipsia, polyphagia and polyuria.  Genitourinary: Negative for difficulty urinating, dysuria, flank pain, frequency and  urgency.  Musculoskeletal: Positive for arthralgias, gait problem and neck pain. Negative for joint swelling and neck stiffness.  Skin: Negative for color change, pallor and rash.  Neurological: Positive for numbness. Negative for dizziness, speech difficulty, weakness, light-headedness and headaches.  Hematological: Does not bruise/bleed easily.  Psychiatric/Behavioral: Negative for agitation, behavioral problems and sleep disturbance. The patient is not nervous/anxious.     Immunization History  Administered Date(s) Administered  . DTaP 10/10/2014  . Fluad Quad(high Dose 65+) 08/28/2019, 08/07/2020  . Influenza,inj,Quad PF,6+ Mos 08/05/2016  . Influenza-Unspecified 10/10/2014  . PFIZER(Purple Top)SARS-COV-2 Vaccination 01/28/2020, 02/18/2020  . Pneumococcal Conjugate-13 11/11/2016  . Pneumococcal-Unspecified 10/10/2014   Pertinent  Health Maintenance Due  Topic Date Due  . HEMOGLOBIN A1C  02/25/2020  . OPHTHALMOLOGY EXAM  05/29/2020  . FOOT EXAM  08/21/2020  . COLONOSCOPY (Pts 45-58yrs Insurance coverage will need to be confirmed)  11/06/2028  . INFLUENZA VACCINE  Completed  . PNA vac Low Risk Adult  Completed   Fall Risk  11/26/2020 10/08/2019 08/28/2019 02/05/2019 01/29/2019  Falls in the past year? 0 0 1 0 0  Number falls in past yr: 0 - 0 0 0  Injury with Fall? 0 - 0 0 0  Comment - - - - -  Risk Factor Category  - - - - -  Risk for fall due to : - Impaired balance/gait;Impaired mobility - - -  Follow up - Falls evaluation completed;Education provided;Falls prevention discussed - - -   Functional Status Survey:    Vitals:   11/26/20 1006  BP: 130/84  Pulse: 80  Resp: 16  Temp: (!) 97.5 F (36.4 C)  SpO2: 92%  Weight: 219 lb 3.2 oz (99.4 kg)  Height: 5\' 10"  (1.778 m)   Body mass index is 31.45 kg/m. Physical Exam Vitals reviewed.  Constitutional:      General: He is not in acute distress.    Appearance: He is obese. He is not ill-appearing.  HENT:     Head:  Normocephalic.     Right Ear: Tympanic membrane, ear canal and external ear normal. There is no impacted cerumen.     Left Ear: Tympanic membrane, ear canal and external ear normal. There is no impacted cerumen.     Nose: Nose normal. No congestion or rhinorrhea.     Mouth/Throat:     Mouth: Mucous membranes are moist.     Pharynx: Oropharynx is clear. No oropharyngeal exudate or posterior oropharyngeal erythema.  Eyes:     General: No scleral icterus.       Right eye: No discharge.        Left eye: No discharge.     Extraocular Movements: Extraocular movements intact.     Conjunctiva/sclera: Conjunctivae normal.     Pupils: Pupils are equal, round, and reactive to light.  Neck:     Vascular: No carotid bruit.  Cardiovascular:     Rate and Rhythm: Normal rate and regular rhythm.     Heart sounds: Normal heart sounds. No murmur heard. No friction rub. No gallop.      Comments: Left BKA wearing Prosthesis  Pulmonary:     Effort: Pulmonary effort is normal. No respiratory distress.     Breath sounds: Normal breath sounds. No wheezing, rhonchi or rales.  Chest:     Chest wall: No tenderness.  Abdominal:     General: Bowel sounds are normal. There is no distension.     Palpations: Abdomen is soft. There is no mass.     Tenderness: There is no abdominal tenderness. There is no right CVA tenderness, left CVA tenderness, guarding or rebound.  Musculoskeletal:        General: No swelling or tenderness.     Cervical back: Normal range of motion. No rigidity or tenderness.     Right lower leg: No edema.     Comments: Unsteady gait on wheelchair during visit. Left BKA   Lymphadenopathy:     Cervical: No cervical adenopathy.  Skin:    General: Skin is warm and dry.     Coloration: Skin is not pale.     Findings: No bruising, erythema, lesion or rash.  Neurological:     Mental Status: He is alert. Mental status is at baseline.     Cranial Nerves: No cranial nerve deficit.     Motor: No  weakness.     Gait: Gait abnormal.  Psychiatric:        Mood and Affect: Mood normal.        Behavior: Behavior normal.        Thought Content: Thought content normal.  Judgment: Judgment normal.     Labs reviewed: Recent Labs    08/07/20 1037  NA 140  K 3.9  CL 107  CO2 28  GLUCOSE 92  BUN 5*  CREATININE 0.48*  CALCIUM 8.4*   Recent Labs    08/07/20 1037  AST 20  ALT 11  BILITOT 0.9  PROT 5.7*   Recent Labs    08/07/20 1037  WBC 4.8  NEUTROABS 2,995  HGB 12.3*  HCT 36.0*  MCV 99.7  PLT 71*   Lab Results  Component Value Date   TSH 1.38 08/07/2020   Lab Results  Component Value Date   HGBA1C 5.8 (H) 08/28/2019   Lab Results  Component Value Date   CHOL 89 08/07/2020   HDL 40 08/07/2020   LDLCALC 35 08/07/2020   TRIG 60 08/07/2020   CHOLHDL 2.2 08/07/2020    Significant Diagnostic Results in last 30 days:  No results found.  Assessment/Plan 1. Essential hypertension B/p at goal. Continue on metoprolol succinate,ramipril  -continue on Statin   2. Type 2 diabetes mellitus with diabetic polyneuropathy, unspecified whether long term insulin use ([place]) Lab Results  Component Value Date   HGBA1C 5.8 (H) 08/28/2019  - continue on metformin.labs to be drawn today then adjust if indicated.  - continue on ACE inhibitor for renal protection  - continue on Statin - Has upcoming appointment for Annual eye and foot exam  - continue with dietary modification.Exercise limited due to L BKA. - continue on Gabapentin for Neuropathy    3. PAF (paroxysmal atrial fibrillation) (HCC) No palpitation reported. - continue EliQuis 5 mg tablet twice daily and Metoprolol succinate   4. Mixed hyperlipidemia Latest LDL at goal  - continue dietary modification  - continue on Statin   5. Chronic intractable headache, unspecified headache type Chronic. Continue on Topamax  - topiramate (TOPAMAX) 50 MG tablet; TAKE 1 AND 1/2 TABLETS BY MOUTH DAILY  Dispense:  45 tablet; Refill: 2  6. Need for Tdap vaccination Advised to getTdap vaccine at the Pharmacy.script send  - Tdap ([place]) 5-2.5-18.5 LF-MCG/0.5 injection; Inject 0.5 mLs into the muscle once for 1 dose.  Dispense: 0.5 mL; Refill: 0  7. Cervicalgia Worsening back of the neck pain.Previous cervical spine CT scan 7/7/20219  reviewed indicated no fracture or spondylolisthesis but showed multilevel arthropathy with areas of nerve root effacement due to bony hypertrophy.No disc extrusion or stenosis.  - Ambulatory referral to Neurology- Neurosurgery for further evaluation  Family/ staff Communication: Reviewed plan of care with patient and spouse.  Labs/tests ordered: Has pending lab work to be drawn today.   Next Appointment : Has appointment in place 02/23/2021 for medical management of chronic issues.   Sandrea Hughs, NP

## 2020-11-26 NOTE — Patient Instructions (Signed)
-   Please get Tdap vaccine at your pharmacy  - Labs done today will call you with results

## 2020-11-27 ENCOUNTER — Ambulatory Visit: Payer: Medicare HMO | Admitting: Podiatry

## 2020-11-27 ENCOUNTER — Ambulatory Visit (INDEPENDENT_AMBULATORY_CARE_PROVIDER_SITE_OTHER): Payer: Medicare HMO

## 2020-11-27 DIAGNOSIS — L97811 Non-pressure chronic ulcer of other part of right lower leg limited to breakdown of skin: Secondary | ICD-10-CM

## 2020-11-27 DIAGNOSIS — I83018 Varicose veins of right lower extremity with ulcer other part of lower leg: Secondary | ICD-10-CM

## 2020-11-27 DIAGNOSIS — E1142 Type 2 diabetes mellitus with diabetic polyneuropathy: Secondary | ICD-10-CM | POA: Diagnosis not present

## 2020-11-28 LAB — MICROALBUMIN / CREATININE URINE RATIO
Creatinine, Urine: 130 mg/dL (ref 20–320)
Microalb Creat Ratio: 7 mcg/mg creat (ref <30)
Microalb, Ur: 0.9 mg/dL

## 2020-12-07 NOTE — Progress Notes (Signed)
  Subjective:  Patient ID: Darrell Horne, male    DOB: 03/01/1946,  MRN: [id number]  No chief complaint on file.  75 y.o. male presents for wound care. Hx confirmed with patient.  Objective:  Physical Exam: Improving right foot wounds.  No warmth no erythema no signs of infection.  Edema and cellulitis of the right leg with multiple small blistering lesions   Assessment:   1. Venous stasis ulcer of other part of right lower leg limited to breakdown of skin with varicose veins (HCC)    Plan:  Patient was evaluated and treated and all questions answered.  Ulcer right foot, right leg venous stasis ulcer -X-rays taken reviewed no osseous involvement -Reapply Unna boot for reduction of edema  No follow-ups on file.

## 2020-12-10 DIAGNOSIS — H353221 Exudative age-related macular degeneration, left eye, with active choroidal neovascularization: Secondary | ICD-10-CM | POA: Diagnosis not present

## 2020-12-10 DIAGNOSIS — E119 Type 2 diabetes mellitus without complications: Secondary | ICD-10-CM | POA: Diagnosis not present

## 2020-12-10 DIAGNOSIS — H353111 Nonexudative age-related macular degeneration, right eye, early dry stage: Secondary | ICD-10-CM | POA: Diagnosis not present

## 2020-12-10 DIAGNOSIS — H04123 Dry eye syndrome of bilateral lacrimal glands: Secondary | ICD-10-CM | POA: Diagnosis not present

## 2020-12-10 DIAGNOSIS — Z961 Presence of intraocular lens: Secondary | ICD-10-CM | POA: Diagnosis not present

## 2020-12-11 ENCOUNTER — Ambulatory Visit: Payer: Medicare HMO | Admitting: Podiatry

## 2020-12-11 ENCOUNTER — Other Ambulatory Visit: Payer: Self-pay

## 2020-12-11 DIAGNOSIS — L97811 Non-pressure chronic ulcer of other part of right lower leg limited to breakdown of skin: Secondary | ICD-10-CM | POA: Diagnosis not present

## 2020-12-11 DIAGNOSIS — I83018 Varicose veins of right lower extremity with ulcer other part of lower leg: Secondary | ICD-10-CM

## 2020-12-14 ENCOUNTER — Other Ambulatory Visit: Payer: Self-pay

## 2020-12-14 ENCOUNTER — Telehealth: Payer: Self-pay

## 2020-12-14 ENCOUNTER — Ambulatory Visit (INDEPENDENT_AMBULATORY_CARE_PROVIDER_SITE_OTHER): Payer: Medicare HMO | Admitting: Family

## 2020-12-14 ENCOUNTER — Encounter: Payer: Self-pay | Admitting: Family

## 2020-12-14 DIAGNOSIS — Z0000 Encounter for general adult medical examination without abnormal findings: Secondary | ICD-10-CM

## 2020-12-14 DIAGNOSIS — Z87891 Personal history of nicotine dependence: Secondary | ICD-10-CM

## 2020-12-14 NOTE — Telephone Encounter (Signed)
Mr. hosteen, kienast are scheduled for a virtual visit with your provider today.    Just as we do with appointments in the office, we must obtain your consent to participate.  Your consent will be active for this visit and any virtual visit you may have with one of our providers in the next 365 days.    If you have a MyChart account, I can also send a copy of this consent to you electronically.  All virtual visits are billed to your insurance company just like a traditional visit in the office.  As this is a virtual visit, video technology does not allow for your provider to perform a traditional examination.  This may limit your provider's ability to fully assess your condition.  If your provider identifies any concerns that need to be evaluated in person or the need to arrange testing such as labs, EKG, etc, we will make arrangements to do so.    Although advances in technology are sophisticated, we cannot ensure that it will always work on either your end or our end.  If the connection with a video visit is poor, we may have to switch to a telephone visit.  With either a video or telephone visit, we are not always able to ensure that we have a secure connection.   I need to obtain your verbal consent now.   Are you willing to proceed with your visit today?   TWAIN STENSETH has provided verbal consent on 12/14/2020 for a virtual visit (video or telephone).   [place], [place] 12/14/2020  2:45 PM

## 2020-12-14 NOTE — Patient Instructions (Signed)
Darrell Horne , Thank you for taking time to come for your Medicare Wellness Visit. I appreciate your ongoing commitment to your health goals. Please review the following plan we discussed and let me know if I can assist you in the future.   Screening recommendations/referrals: Colonoscopy: N/A  Recommended yearly ophthalmology/optometry visit for glaucoma screening and checkup Recommended yearly dental visit for hygiene and checkup  Vaccinations: Influenza vaccine: Up to date  Pneumococcal vaccine : Up to date  Tdap vaccine : Up to date  Shingles vaccine : Advised to get vaccine at his pharmacy    Advanced directives: yes   Conditions/risks identified: Advance age male > 22 yrs old,Hypertension,male gender,dyslipidemia,Obesity ,sedentary lifestyle,hx of smoking  Next appointment: 1 year   Preventive Care 12 Years and Older, Male Preventive care refers to lifestyle choices and visits with your health care provider that can promote health and wellness. What does preventive care include?  A yearly physical exam. This is also called an annual well check.  Dental exams once or twice a year.  Routine eye exams. Ask your health care provider how often you should have your eyes checked.  Personal lifestyle choices, including:  Daily care of your teeth and gums.  Regular physical activity.  Eating a healthy diet.  Avoiding tobacco and drug use.  Limiting alcohol use.  Practicing safe sex.  Taking low doses of aspirin every day.  Taking vitamin and mineral supplements as recommended by your health care provider. What happens during an annual well check? The services and screenings done by your health care provider during your annual well check will depend on your age, overall health, lifestyle risk factors, and family history of disease. Counseling  Your health care provider may ask you questions about your:  Alcohol use.  Tobacco use.  Drug use.  Emotional  well-being.  Home and relationship well-being.  Sexual activity.  Eating habits.  History of falls.  Memory and ability to understand (cognition).  Work and work Statistician. Screening  You may have the following tests or measurements:  Height, weight, and BMI.  Blood pressure.  Lipid and cholesterol levels. These may be checked every 5 years, or more frequently if you are over 60 years old.  Skin check.  Lung cancer screening. You may have this screening every year starting at age 1 if you have a 30-pack-year history of smoking and currently smoke or have quit within the past 15 years.  Fecal occult blood test (FOBT) of the stool. You may have this test every year starting at age 27.  Flexible sigmoidoscopy or colonoscopy. You may have a sigmoidoscopy every 5 years or a colonoscopy every 10 years starting at age 40.  Prostate cancer screening. Recommendations will vary depending on your family history and other risks.  Hepatitis C blood test.  Hepatitis B blood test.  Sexually transmitted disease (STD) testing.  Diabetes screening. This is done by checking your blood sugar (glucose) after you have not eaten for a while (fasting). You may have this done every 1-3 years.  Abdominal aortic aneurysm (AAA) screening. You may need this if you are a current or former smoker.  Osteoporosis. You may be screened starting at age 62 if you are at high risk. Talk with your health care provider about your test results, treatment options, and if necessary, the need for more tests. Vaccines  Your health care provider may recommend certain vaccines, such as:  Influenza vaccine. This is recommended every year.  Tetanus, diphtheria,  and acellular pertussis (Tdap, Td) vaccine. You may need a Td booster every 10 years.  Zoster vaccine. You may need this after age 50.  Pneumococcal 13-valent conjugate (PCV13) vaccine. One dose is recommended after age 14.  Pneumococcal  polysaccharide (PPSV23) vaccine. One dose is recommended after age 1. Talk to your health care provider about which screenings and vaccines you need and how often you need them. This information is not intended to replace advice given to you by your health care provider. Make sure you discuss any questions you have with your health care provider. Document Released: 10/23/2015 Document Revised: 06/15/2016 Document Reviewed: 07/28/2015 Elsevier Interactive Patient Education  2017 [place] Prevention in the Home Falls can cause injuries. They can happen to people of all ages. There are many things you can do to make your home safe and to help prevent falls. What can I do on the outside of my home?  Regularly fix the edges of walkways and driveways and fix any cracks.  Remove anything that might make you trip as you walk through a door, such as a raised step or threshold.  Trim any bushes or trees on the path to your home.  Use bright outdoor lighting.  Clear any walking paths of anything that might make someone trip, such as rocks or tools.  Regularly check to see if handrails are loose or broken. Make sure that both sides of any steps have handrails.  Any raised decks and porches should have guardrails on the edges.  Have any leaves, snow, or ice cleared regularly.  Use sand or salt on walking paths during winter.  Clean up any spills in your garage right away. This includes oil or grease spills. What can I do in the bathroom?  Use night lights.  Install grab bars by the toilet and in the tub and shower. Do not use towel bars as grab bars.  Use non-skid mats or decals in the tub or shower.  If you need to sit down in the shower, use a plastic, non-slip stool.  Keep the floor dry. Clean up any water that spills on the floor as soon as it happens.  Remove soap buildup in the tub or shower regularly.  Attach bath mats securely with double-sided non-slip rug  tape.  Do not have throw rugs and other things on the floor that can make you trip. What can I do in the bedroom?  Use night lights.  Make sure that you have a light by your bed that is easy to reach.  Do not use any sheets or blankets that are too big for your bed. They should not hang down onto the floor.  Have a firm chair that has side arms. You can use this for support while you get dressed.  Do not have throw rugs and other things on the floor that can make you trip. What can I do in the kitchen?  Clean up any spills right away.  Avoid walking on wet floors.  Keep items that you use a lot in easy-to-reach places.  If you need to reach something above you, use a strong step stool that has a grab bar.  Keep electrical cords out of the way.  Do not use floor polish or wax that makes floors slippery. If you must use wax, use non-skid floor wax.  Do not have throw rugs and other things on the floor that can make you trip. What can I do with my  stairs?  Do not leave any items on the stairs.  Make sure that there are handrails on both sides of the stairs and use them. Fix handrails that are broken or loose. Make sure that handrails are as long as the stairways.  Check any carpeting to make sure that it is firmly attached to the stairs. Fix any carpet that is loose or worn.  Avoid having throw rugs at the top or bottom of the stairs. If you do have throw rugs, attach them to the floor with carpet tape.  Make sure that you have a light switch at the top of the stairs and the bottom of the stairs. If you do not have them, ask someone to add them for you. What else can I do to help prevent falls?  Wear shoes that:  Do not have high heels.  Have rubber bottoms.  Are comfortable and fit you well.  Are closed at the toe. Do not wear sandals.  If you use a stepladder:  Make sure that it is fully opened. Do not climb a closed stepladder.  Make sure that both sides of the  stepladder are locked into place.  Ask someone to hold it for you, if possible.  Clearly mark and make sure that you can see:  Any grab bars or handrails.  First and last steps.  Where the edge of each step is.  Use tools that help you move around (mobility aids) if they are needed. These include:  Canes.  Walkers.  Scooters.  Crutches.  Turn on the lights when you go into a dark area. Replace any light bulbs as soon as they burn out.  Set up your furniture so you have a clear path. Avoid moving your furniture around.  If any of your floors are uneven, fix them.  If there are any pets around you, be aware of where they are.  Review your medicines with your doctor. Some medicines can make you feel dizzy. This can increase your chance of falling. Ask your doctor what other things that you can do to help prevent falls. This information is not intended to replace advice given to you by your health care provider. Make sure you discuss any questions you have with your health care provider. Document Released: 07/23/2009 Document Revised: 03/03/2016 Document Reviewed: 10/31/2014 Elsevier Interactive Patient Education  2017 [employer].

## 2020-12-14 NOTE — Progress Notes (Signed)
This service is provided via telemedicine  No vital signs collected/recorded due to the encounter was a telemedicine visit.   Location of patient (ex: home, work): Home.   Patient consents to a telephone visit: Yes  Location of the provider (ex: office, home): [facility].  Name of any referring provider: Benna Arno, Nelda Bucks, NP   Names of all persons participating in the telemedicine service and their role in the encounter: Patient, [name], [place], [place], Webb Silversmith, NP.    Time spent on call: 8 minutes spent on the phone with Medical Assistant.      Subjective:   Darrell Horne is a 75 y.o. male who presents for Medicare Annual/Subsequent preventive examination.  Review of Systems     Cardiac Risk Factors include: diabetes mellitus;hypertension;male gender;dyslipidemia;obesity (BMI >30kg/m2);advanced age (>69men, >59 women);smoking/ tobacco exposure;sedentary lifestyle     Objective:    There were no vitals filed for this visit. There is no height or weight on file to calculate BMI.  Advanced Directives 12/14/2020 11/26/2020 08/07/2020 02/03/2020 01/29/2019 11/06/2018 10/29/2018  Does Patient Have a Medical Advance Directive? Yes Yes Yes Yes Yes Yes Yes  Type of Paramedic of [place];Living will [place];Living will Living will;Healthcare Power of [place] will [place];Living will [place]  Does patient want to make changes to medical advance directive? No - Patient declined No - Patient declined No - Patient declined No - Patient declined - - No - Patient declined  Copy of [place] in Chart? Yes - validated most recent copy scanned in chart (See row information) Yes - validated most recent copy scanned in chart (See row information) Yes - validated most recent copy scanned in chart (See row information) Yes - validated most recent  copy scanned in chart (See row information) - No - copy requested Yes - validated most recent copy scanned in chart (See row information)  Would patient like information on creating a medical advance directive? - - - - - - -    Current Medications (verified) Outpatient Encounter Medications as of 12/14/2020  Medication Sig  . amitriptyline (ELAVIL) 50 MG tablet TAKE 1 TABLET BY MOUTH EVERYDAY AT BEDTIME  . atorvastatin (LIPITOR) 40 MG tablet Take 1 tablet (40 mg total) by mouth daily.  Marland Kitchen b complex vitamins tablet Take 1 tablet by mouth daily.  Marland Kitchen ELIQUIS 5 MG TABS tablet TAKE 1 TABLET BY MOUTH TWICE A DAY  . folic acid (FOLVITE) 1 MG tablet Take 1 tablet (1 mg total) by mouth daily.  Marland Kitchen gabapentin (NEURONTIN) 600 MG tablet Take 1 tablet (600 mg total) by mouth 3 (three) times daily.  . Menthol 5 % PTCH Apply 1 patch topically as needed (back pain).  . metFORMIN (GLUCOPHAGE-XR) 500 MG 24 hr tablet Take two Tablets by mouth daily with supper  . metoprolol succinate (TOPROL-XL) 50 MG 24 hr tablet TAKE 1 TABLET BY MOUTH DAILY WITH SUPPER. TAKE WITH OR IMMEDIATELY FOLLOWING A MEAL.  Marland Kitchen omeprazole (PRILOSEC) 20 MG capsule TAKE 1 CAPSULE BY MOUTH EVERY DAY  . ONETOUCH VERIO test strip USE TO TEST BLOOD SUGAR TWICE DAILY DX: E11.42  . ramipril (ALTACE) 1.25 MG capsule TAKE 1 CAPSULE BY MOUTH DAILY.  Marland Kitchen topiramate (TOPAMAX) 50 MG tablet TAKE 1 AND 1/2 TABLETS BY MOUTH DAILY  . vitamin B-12 (CYANOCOBALAMIN) 1000 MCG tablet Take 1,000 mcg by mouth daily.  . vitamin C (  ASCORBIC ACID) 500 MG tablet Take 500 mg by mouth daily.    No facility-administered encounter medications on file as of 12/14/2020.    Allergies (verified) Patient has no known allergies.   History: Past Medical History:  Diagnosis Date  . Chest pain   . Diabetes mellitus   . DM2 (diabetes mellitus, type 2) ([place])   . GERD (gastroesophageal reflux disease)   . HLD (hyperlipidemia)   . HTN (hypertension)   . PAF (paroxysmal atrial  fibrillation) ([place])   . Palpitations    Past Surgical History:  Procedure Laterality Date  . APPENDECTOMY    . CHOLECYSTECTOMY    . COLONOSCOPY     Dr. Michail Sermon  . COLONOSCOPY WITH PROPOFOL N/A 11/06/2018   Procedure: COLONOSCOPY WITH PROPOFOL;  Surgeon: Wilford Corner, MD;  Location: [facility] END[facility]COPY;  Service: Endoscopy;  Laterality: N/A;  . HERNIA REPAIR    . LEG AMPUTATION Left [id number]   Dr. Kendell Bane  . POLYPECTOMY  11/06/2018   Procedure: POLYPECTOMY;  Surgeon: Wilford Corner, MD;  Location: [facility] END[facility]COPY;  Service: Endoscopy;;  . SHOULDER SURGERY  [id number]   Dr. Meridee Score  . TOTAL ANKLE ARTHROPLASTY  [id number]   Family History  Problem Relation Age of Onset  . Heart disease Mother        Living  . Stroke Father 30       Deceased  . Diabetes Sister   . Cancer Sister    Social History   Socioeconomic History  . Marital status: Married    Spouse name: Not on file  . Number of children: 4  . Years of education: Not on file  . Highest education level: Not on file  Occupational History  . Occupation: retired Nurse, mental health  . Smoking status: Former Smoker    Years: 12.00    Quit date: 10/10/1981    Years since quitting: 39.2  . Smokeless tobacco: Never Used  Vaping Use  . Vaping Use: Never used  Substance and Sexual Activity  . Alcohol use: Yes    Comment: has glass of wine once a week but states rarely   . Drug use: No  . Sexual activity: Never  Other Topics Concern  . Not on file  Social History Narrative   Diet:   Do you drink/eat things with caffeine?  Yes   Marital status:  Divorced.  What year were you married?  2001   Do you live in a house, assisted living, condo, apartment, trailer, etc.?  Apartment   Is it one or more stories?  1 story   How many persons live in your home?  1   Do you have any pets in your home?  No   Current or past profession:  Truck driver   Do you exercise?  Some  Type and how often:  Walking   Social  Determinants of Radio broadcast assistant Strain: Not on file  Food Insecurity: Not on file  Transportation Needs: Not on file  Physical Activity: Not on file  Stress: Not on file  Social Connections: Not on file    Tobacco Counseling Counseling given: Not Answered   Clinical Intake:  Pre-visit preparation completed: No  Pain : No/denies pain     BMI - recorded: 31.45 Nutritional Status: BMI > 30  Obese Nutritional Risks: Non-healing wound (see podiatrist for non- healing wound on right foot) Diabetes: Yes CBG done?: No Did pt. bring in CBG monitor from home?: No (not  checking blood sugar)  How often do you need to have someone help you when you read instructions, pamphlets, or other written materials from your doctor or pharmacy?: 1 - Never What is the last grade level you completed in school?: 11 grade  Diabetic?Yes  Interpreter Needed?: No  Information entered by :: Bralyn Espino,FNP-C   Activities of Daily Living In your present state of health, do you have any difficulty performing the following activities: 12/14/2020  Hearing? Y  Comment wears hearing aids  Vision? N  Comment has reading glasses  Difficulty concentrating or making decisions? Y  Comment Remembering somethings  Walking or climbing stairs? Y  Comment Left BKA  Dressing or bathing? N  Doing errands, shopping? Y  Comment POA assist with driving  Preparing Food and eating ? N  Using the Toilet? N  In the past six months, have you accidently leaked urine? Y  Do you have problems with loss of bowel control? N  Managing your Medications? N  Managing your Finances? N  Housekeeping or managing your Housekeeping? N  Some recent data might be hidden    Patient Care Team: Omya Winfield, Nelda Bucks, NP as PCP - General (Family Medicine) Clent Jacks, MD as Consulting Physician (Ophthalmology) Alda Berthold, DO as Consulting Physician (Neurology)  Indicate any recent Medical Services you may have  received from other than [facility] providers in the past year (date may be approximate).     Assessment:   This is a routine wellness examination for Darrell Horne.  Hearing/Vision screen  Hearing Screening   125Hz  250Hz  500Hz  1000Hz  2000Hz  3000Hz  4000Hz  6000Hz  8000Hz   Right ear:           Left ear:           Comments: No Hearing Concerns. Patient wears hearing aids.  Vision Screening Comments: No Vision Concerns.   Dietary issues and exercise activities discussed: Current Exercise Habits: The patient does not participate in regular exercise at present, Exercise limited by: Other - see comments (left BKA)  Goals    . Reduce sugar intake to X grams per day     Starting 11/11/16, I will attempt to decrease my sugar intake.       Depression Screen PHQ 2/9 Scores 10/08/2019 01/29/2019 01/24/2018 11/11/2016 12/09/2015  PHQ - 2 Score 0 0 2 1 0  PHQ- 9 Score - - 5 - -    Fall Risk Fall Risk  12/14/2020 11/26/2020 10/08/2019 08/28/2019 02/05/2019  Falls in the past year? 0 0 0 1 0  Number falls in past yr: 0 0 - 0 0  Injury with Fall? 0 0 - 0 0  Comment - - - - -  Risk Factor Category  - - - - -  Risk for fall due to : - - Impaired balance/gait;Impaired mobility - -  Follow up - - Falls evaluation completed;Education provided;Falls prevention discussed - -    FALL RISK PREVENTION PERTAINING TO THE HOME:  Any stairs in or around the home? No  If so, are there any without handrails? No  Home free of loose throw rugs in walkways, pet beds, electrical cords, etc? No  Adequate lighting in your home to reduce risk of falls? Yes   ASSISTIVE DEVICES UTILIZED TO PREVENT FALLS:  Life alert? No  Use of a cane, walker or w/c? Yes  Grab bars in the bathroom? Yes  Shower chair or bench in shower? Yes  Elevated toilet seat or a handicapped toilet? Yes  TIMED UP AND GO:  Was the test performed? No .  Length of time to ambulate 10 feet: N/A sec.   Gait unsteady with use of assistive device, provider  informed and education provided.   Cognitive Function: MMSE - Mini Mental State Exam 01/24/2018 11/11/2016 12/09/2015  Not completed: - - (No Data)  Orientation to time 5 5 5   Orientation to Place 5 5 5   Registration 3 3 3   Attention/ Calculation 5 4 5   Recall 2 3 2   Language- name 2 objects 2 2 2   Language- repeat 1 1 1   Language- follow 3 step command 3 3 3   Language- read & follow direction 1 1 1   Write a sentence 1 1 1   Copy design 0 1 1  Total score 28 29 29      6CIT Screen 12/14/2020 01/29/2019  What Year? 0 points 0 points  What month? 0 points 0 points  What time? 0 points 0 points  Count back from 20 0 points 0 points  Months in reverse 2 points 0 points  Repeat phrase 0 points 2 points  Total Score 2 2    Immunizations Immunization History  Administered Date(s) Administered  . DTaP 10/10/2014  . Fluad Quad(high Dose 65+) 08/28/2019, 08/07/2020  . Influenza,inj,Quad PF,6+ Mos 08/05/2016  . Influenza-Unspecified 10/10/2014  . PFIZER(Purple Top)SARS-COV-2 Vaccination 01/28/2020, 02/18/2020  . Pneumococcal Conjugate-13 11/11/2016  . Pneumococcal-Unspecified 10/10/2014  . Tdap 12/01/2020    TDAP status: Up to date  Flu Vaccine status: Up to date  Pneumococcal vaccine status: Up to date  Covid-19 vaccine status: Information provided on how to obtain vaccines.   Qualifies for Shingles Vaccine? Yes   Zostavax completed No   Shingrix Completed?: No.    Education has been provided regarding the importance of this vaccine. Patient has been advised to call insurance company to determine out of pocket expense if they have not yet received this vaccine. Advised may also receive vaccine at local pharmacy or Health Dept. Verbalized acceptance and understanding.  Screening Tests Health Maintenance  Topic Date Due  . HEMOGLOBIN A1C  02/25/2020  . OPHTHALMOLOGY EXAM  05/29/2020  . COVID-19 Vaccine (3 - Booster for Pfizer series) 08/20/2020  . FOOT EXAM  05/19/2021  .  COLONOSCOPY (Pts 45-56yrs Insurance coverage will need to be confirmed)  11/06/2028  . TETANUS/TDAP  12/01/2030  . INFLUENZA VACCINE  Completed  . Hepatitis C Screening  Completed  . PNA vac Low Risk Adult  Completed  . HPV VACCINES  Aged Out    Health Maintenance  Health Maintenance Due  Topic Date Due  . HEMOGLOBIN A1C  02/25/2020  . OPHTHALMOLOGY EXAM  05/29/2020  . COVID-19 Vaccine (3 - Booster for Pfizer series) 08/20/2020    Colorectal cancer screening: No longer required.   Lung Cancer Screening: (Low Dose CT Chest recommended if Age 69-80 years, 30 pack-year currently smoking OR have quit w/in 15years.) does qualify.   Lung Cancer Screening Referral: Yes   Additional Screening:  Hepatitis C Screening: does not qualify; Completed   Vision Screening: Recommended annual ophthalmology exams for early detection of glaucoma and other disorders of the eye. Is the patient up to date with their annual eye exam?  No has upcoming appointment 12/15/2020  Who is the provider or what is the name of the office in which the patient attends annual eye exams? Dr.Groat  If pt is not established with a provider, would they like to be referred to a provider to  establish care? No .   Dental Screening: Recommended annual dental exams for proper oral hygiene  Community Resource Referral / Chronic Care Management: CRR required this visit?  No   CCM required this visit?  No      Plan:   - Shingrix vaccine advised to get vaccine at his pharmacy  - COVID-19 booster vaccine will get at his pharmacy   I have personally reviewed and noted the following in the patient's chart:   . Medical and social history . Use of alcohol, tobacco or illicit drugs  . Current medications and supplements . Functional ability and status . Nutritional status . Physical activity . Advanced directives . List of other physicians . Hospitalizations, surgeries, and ER visits in previous 12  months . Vitals . Screenings to include cognitive, depression, and falls . Referrals and appointments  In addition, I have reviewed and discussed with patient certain preventive protocols, quality metrics, and best practice recommendations. A written personalized care plan for preventive services as well as general preventive health recommendations were provided to patient.     Sandrea Hughs, NP   12/14/2020   Nurse Notes:Adviased to get shingrix and COVID-19 vaccine at his pharmacy

## 2020-12-16 ENCOUNTER — Encounter (INDEPENDENT_AMBULATORY_CARE_PROVIDER_SITE_OTHER): Payer: Self-pay | Admitting: Ophthalmology

## 2020-12-16 ENCOUNTER — Ambulatory Visit (INDEPENDENT_AMBULATORY_CARE_PROVIDER_SITE_OTHER): Payer: Medicare HMO | Admitting: Ophthalmology

## 2020-12-16 ENCOUNTER — Other Ambulatory Visit: Payer: Self-pay

## 2020-12-16 DIAGNOSIS — H353111 Nonexudative age-related macular degeneration, right eye, early dry stage: Secondary | ICD-10-CM

## 2020-12-16 DIAGNOSIS — H353221 Exudative age-related macular degeneration, left eye, with active choroidal neovascularization: Secondary | ICD-10-CM | POA: Diagnosis not present

## 2020-12-16 DIAGNOSIS — E119 Type 2 diabetes mellitus without complications: Secondary | ICD-10-CM | POA: Diagnosis not present

## 2020-12-16 NOTE — Progress Notes (Signed)
12/16/2020     CHIEF COMPLAINT Patient presents for Retina Follow Up (WIP SRF OS - Ref'd back by Dr. Elliot Dally - hx of Avastin OS - not seen x 1 year//Pt reports blur OS. Pt c/o watering OU. Pt does not recall when VA OS blur began.)   HISTORY OF PRESENT ILLNESS: Darrell Horne is a 75 y.o. male who presents to the clinic today for:   HPI    Retina Follow Up    Patient presents with  Wet AMD.  In left eye.  This started 1 year ago.  Severity is mild.  Duration of 1 year.  Since onset it is gradually worsening. Additional comments: WIP SRF OS - Ref'd back by Dr. Elliot Dally - hx of Avastin OS - not seen x 1 year  Pt reports blur OS. Pt c/o watering OU. Pt does not recall when VA OS blur began.       Last edited by Rockie Neighbours, [place] on 12/16/2020 10:27 AM. (History)      Referring physician: Sandrea Hughs, NP [place],  [place] [address]  HISTORICAL INFORMATION:   Selected notes from the MEDICAL RECORD NUMBER    Lab Results  Component Value Date   HGBA1C 5.8 (H) 08/28/2019     CURRENT MEDICATIONS: No current outpatient medications on file. (Ophthalmic Drugs)   No current facility-administered medications for this visit. (Ophthalmic Drugs)   Current Outpatient Medications (Other)  Medication Sig  . amitriptyline (ELAVIL) 50 MG tablet TAKE 1 TABLET BY MOUTH EVERYDAY AT BEDTIME  . atorvastatin (LIPITOR) 40 MG tablet Take 1 tablet (40 mg total) by mouth daily.  Marland Kitchen b complex vitamins tablet Take 1 tablet by mouth daily.  Marland Kitchen ELIQUIS 5 MG TABS tablet TAKE 1 TABLET BY MOUTH TWICE A DAY  . folic acid (FOLVITE) 1 MG tablet Take 1 tablet (1 mg total) by mouth daily.  Marland Kitchen gabapentin (NEURONTIN) 600 MG tablet Take 1 tablet (600 mg total) by mouth 3 (three) times daily.  . Menthol 5 % PTCH Apply 1 patch topically as needed (back pain).  . metFORMIN (GLUCOPHAGE-XR) 500 MG 24 hr tablet Take two Tablets by mouth daily with supper  . metoprolol succinate (TOPROL-XL) 50 MG 24 hr  tablet TAKE 1 TABLET BY MOUTH DAILY WITH SUPPER. TAKE WITH OR IMMEDIATELY FOLLOWING A MEAL.  Marland Kitchen omeprazole (PRILOSEC) 20 MG capsule TAKE 1 CAPSULE BY MOUTH EVERY DAY  . ONETOUCH VERIO test strip USE TO TEST BLOOD SUGAR TWICE DAILY DX: E11.42  . ramipril (ALTACE) 1.25 MG capsule TAKE 1 CAPSULE BY MOUTH DAILY.  Marland Kitchen topiramate (TOPAMAX) 50 MG tablet TAKE 1 AND 1/2 TABLETS BY MOUTH DAILY  . vitamin B-12 (CYANOCOBALAMIN) 1000 MCG tablet Take 1,000 mcg by mouth daily.  . vitamin C (ASCORBIC ACID) 500 MG tablet Take 500 mg by mouth daily.    No current facility-administered medications for this visit. (Other)      REVIEW OF SYSTEMS:    ALLERGIES No Known Allergies  PAST MEDICAL HISTORY Past Medical History:  Diagnosis Date  . Chest pain   . Diabetes mellitus   . DM2 (diabetes mellitus, type 2) ([place])   . GERD (gastroesophageal reflux disease)   . HLD (hyperlipidemia)   . HTN (hypertension)   . PAF (paroxysmal atrial fibrillation) ([place])   . Palpitations    Past Surgical History:  Procedure Laterality Date  . APPENDECTOMY    . CHOLECYSTECTOMY    . COLONOSCOPY  Dr. Michail Sermon  . COLONOSCOPY WITH PROPOFOL N/A 11/06/2018   Procedure: COLONOSCOPY WITH PROPOFOL;  Surgeon: Wilford Corner, MD;  Location: [facility];  Service: Endoscopy;  Laterality: N/A;  . HERNIA REPAIR    . LEG AMPUTATION Left [id number]   Dr. Kendell Bane  . POLYPECTOMY  11/06/2018   Procedure: POLYPECTOMY;  Surgeon: Wilford Corner, MD;  Location: [facility];  Service: Endoscopy;;  . SHOULDER SURGERY  [id number]   Dr. Meridee Score  . TOTAL ANKLE ARTHROPLASTY  [id number]    FAMILY HISTORY Family History  Problem Relation Age of Onset  . Heart disease Mother        Living  . Stroke Father 28       Deceased  . Diabetes Sister   . Cancer Sister     SOCIAL HISTORY Social History   Tobacco Use  . Smoking status: Former Smoker    Years: 12.00    Quit date: 10/10/1981    Years since quitting: 39.2  .  Smokeless tobacco: Never Used  Vaping Use  . Vaping Use: Never used  Substance Use Topics  . Alcohol use: Yes    Comment: has glass of wine once a week but states rarely   . Drug use: No         OPHTHALMIC EXAM:  Base Eye Exam    Visual Acuity (ETDRS)      Right Left   Dist [place] 20/25 20/30 -1   Dist ph [place]  NI       Tonometry (Tonopen, 10:27 AM)      Right Left   Pressure 15 10       Pupils      Dark Light Shape React APD   Right 4.5 3.5 Round Brisk None   Left 4 3 Round Brisk None       Visual Fields (Counting fingers)      Left Right    Full Full       Extraocular Movement      Right Left    Full Full       Neuro/Psych    Oriented x3: Yes   Mood/Affect: Normal       Dilation    Both eyes: 1.0% Mydriacyl, 2.5% Phenylephrine @ 10:31 AM        Slit Lamp and Fundus Exam    External Exam      Right Left   External Normal Normal       Slit Lamp Exam      Right Left   Lids/Lashes Normal Normal   Conjunctiva/Sclera White and quiet White and quiet   Cornea Clear Clear   Anterior Chamber Deep and quiet Deep and quiet   Iris Round and reactive Round and reactive   Lens Centered posterior chamber intraocular lens Centered posterior chamber intraocular lens   Anterior Vitreous Normal Normal       Fundus Exam      Right Left   Posterior Vitreous Posterior vitreous detachment Posterior vitreous detachment   Disc Normal Normal   C/D Ratio 0.65 0.65   Macula Early age related macular degeneration, Hard drusen, no membrane, no hemorrhage Intermediate age related macular degeneration, Geographic atrophy, Hard drusen   Vessels Normal, no DR Normal, no DR   Periphery Normal Normal          IMAGING AND PROCEDURES  Imaging and Procedures for 12/16/20  OCT, Retina - OU - Both Eyes       Right Eye Quality was good.  Scan locations included subfoveal. Central Foveal Thickness: 232. Progression has been stable. Findings include no SRF, no IRF, abnormal  foveal contour, retinal drusen .   Left Eye Quality was good. Scan locations included subfoveal. Central Foveal Thickness: 284. Findings include subretinal fluid, pigment epithelial detachment, subretinal scarring, abnormal foveal contour.   Notes OD with no signs of CNVM.    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                ASSESSMENT/PLAN:  Exudative age-related macular degeneration of left eye with active choroidal neovascularization (HCC) The nature of wet macular degeneration was discussed with the patient.  Forms of therapy reviewed include the use of Anti-VEGF medications injected painlessly into the eye, as well as other possible treatment modalities, including thermal laser therapy. Fellow eye involvement and risks were discussed with the patient. Upon the finding of wet age related macular degeneration, treatment will be offered. The treatment regimen is on a treat as needed basis with the intent to treat if necessary and extend interval of exams when possible. On average 1 out of 6 patients do not need lifetime therapy. However, the risk of recurrent disease is high for a lifetime.  Initially monthly, then periodic, examinations and evaluations will determine whether the next treatment is required on the day of the examination.  Will need fluorescein angiography as well as delineation of the extent of the CNVM and serous retinal detachment left eye as well as resumption of therapy in the near future  Diabetes mellitus without complication (HCC) No detectable diabetic eye disease noted today      ICD-10-CM   1. Exudative age-related macular degeneration of left eye with active choroidal neovascularization (HCC)  H35.3221 OCT, Retina - OU - Both Eyes  2. Diabetes mellitus without complication ([place])  Z60.1   3. Early stage nonexudative age-related macular degeneration of right eye  H35.3111     1.   Apparent wet age-related macular degeneration with apparent vascularized pigment epithelial detachment left eye will require fluorescein angiography to delineate the extent of the macular disease.  We will then be able to discuss a proper therapy and types of therapy whether Avastin or other medications may be appropriate.  2.  3.  Ophthalmic Meds Ordered this visit:  No orders of the defined types were placed in this encounter.      Return in about 5 days (around 12/21/2020) for DILATE OU, COLOR FP, OPTOS FFA L/R, AVASTIN OCT, OS.  There are no Patient Instructions on file for this visit.   Explained the diagnoses, plan, and follow up with the patient and they expressed understanding.  Patient expressed understanding of the importance of proper follow up care.   Clent Demark Rankin M.D. Diseases & Surgery of the Retina and Vitreous Retina & Diabetic [place] 12/16/20     Abbreviations: M myopia (nearsighted); A astigmatism; H hyperopia (farsighted); P presbyopia; Mrx spectacle prescription;  CTL contact lenses; OD right eye; OS left eye; OU both eyes  XT exotropia; ET esotropia; PEK punctate epithelial keratitis; PEE punctate epithelial erosions; DES dry eye syndrome; MGD meibomian gland dysfunction; ATs artificial tears; PFAT's preservative free artificial tears; [place] nuclear sclerotic cataract; PSC posterior subcapsular cataract; ERM epi-retinal membrane; PVD posterior vitreous detachment; RD retinal detachment; DM diabetes mellitus; DR diabetic retinopathy; NPDR non-proliferative diabetic retinopathy; PDR proliferative diabetic retinopathy; CSME clinically significant macular edema; DME diabetic macular edema; dbh dot blot hemorrhages; CWS cotton wool  spot; POAG primary open angle glaucoma; C/D cup-to-disc ratio; HVF humphrey visual field; GVF goldmann visual field; OCT optical coherence tomography; IOP intraocular pressure; BRVO Faylynn Stamos retinal vein occlusion; CRVO central retinal vein  occlusion; CRAO central retinal artery occlusion; BRAO Etty Isaac retinal artery occlusion; RT retinal tear; SB scleral buckle; PPV pars plana vitrectomy; VH Vitreous hemorrhage; PRP panretinal laser photocoagulation; IVK intravitreal kenalog; VMT vitreomacular traction; MH Macular hole;  NVD neovascularization of the disc; NVE neovascularization elsewhere; AREDS age related eye disease study; ARMD age related macular degeneration; POAG primary open angle glaucoma; EBMD epithelial/anterior basement membrane dystrophy; ACIOL anterior chamber intraocular lens; IOL intraocular lens; PCIOL posterior chamber intraocular lens; Phaco/IOL phacoemulsification with intraocular lens placement; [place] photorefractive keratectomy; LASIK laser assisted in situ keratomileusis; HTN hypertension; DM diabetes mellitus; COPD chronic obstructive pulmonary disease

## 2020-12-16 NOTE — Assessment & Plan Note (Signed)
No detectable diabetic eye disease noted today

## 2020-12-16 NOTE — Assessment & Plan Note (Signed)
The nature of wet macular degeneration was discussed with the patient.  Forms of therapy reviewed include the use of Anti-VEGF medications injected painlessly into the eye, as well as other possible treatment modalities, including thermal laser therapy. Fellow eye involvement and risks were discussed with the patient. Upon the finding of wet age related macular degeneration, treatment will be offered. The treatment regimen is on a treat as needed basis with the intent to treat if necessary and extend interval of exams when possible. On average 1 out of 6 patients do not need lifetime therapy. However, the risk of recurrent disease is high for a lifetime.  Initially monthly, then periodic, examinations and evaluations will determine whether the next treatment is required on the day of the examination.  Will need fluorescein angiography as well as delineation of the extent of the CNVM and serous retinal detachment left eye as well as resumption of therapy in the near future

## 2020-12-17 ENCOUNTER — Ambulatory Visit (INDEPENDENT_AMBULATORY_CARE_PROVIDER_SITE_OTHER): Payer: Medicare HMO | Admitting: Ophthalmology

## 2020-12-17 ENCOUNTER — Encounter (INDEPENDENT_AMBULATORY_CARE_PROVIDER_SITE_OTHER): Payer: Self-pay | Admitting: Ophthalmology

## 2020-12-17 DIAGNOSIS — H353133 Nonexudative age-related macular degeneration, bilateral, advanced atrophic without subfoveal involvement: Secondary | ICD-10-CM | POA: Diagnosis not present

## 2020-12-17 DIAGNOSIS — H353221 Exudative age-related macular degeneration, left eye, with active choroidal neovascularization: Secondary | ICD-10-CM

## 2020-12-17 MED ORDER — BEVACIZUMAB 2.5 MG/0.1ML IZ SOSY
2.5000 mg | PREFILLED_SYRINGE | INTRAVITREAL | Status: AC | PRN
  Administered 2020-12-17: 2.5 mg via INTRAVITREAL

## 2020-12-17 MED ORDER — FLUORESCEIN SODIUM 10 % IV SOLN
500.0000 mg | INTRAVENOUS | Status: AC | PRN
  Administered 2020-12-17: 500 mg via INTRAVENOUS

## 2020-12-17 NOTE — Progress Notes (Signed)
12/17/2020     CHIEF COMPLAINT Patient presents for Retina Follow Up (FFA L/R. FP. Possible avastin OS. OCT/Pt denies changes in vision since yesterdays visit./BGL: did not check)   HISTORY OF PRESENT ILLNESS: Darrell Horne is a 75 y.o. male who presents to the clinic today for:   HPI    Retina Follow Up    Patient presents with  Wet AMD.  In left eye.  Severity is moderate.  Duration of 1 day.  Since onset it is stable.  I, the attending physician,  performed the HPI with the patient and updated documentation appropriately. Additional comments: FFA L/R. FP. Possible avastin OS. OCT Pt denies changes in vision since yesterdays visit. BGL: did not check       Last edited by Tilda Franco on 12/17/2020 11:04 AM. (History)      Referring physician: Sandrea Hughs, NP [place],  [place] [address]  HISTORICAL INFORMATION:   Selected notes from the MEDICAL RECORD NUMBER    Lab Results  Component Value Date   HGBA1C 5.8 (H) 08/28/2019     CURRENT MEDICATIONS: No current outpatient medications on file. (Ophthalmic Drugs)   No current facility-administered medications for this visit. (Ophthalmic Drugs)   Current Outpatient Medications (Other)  Medication Sig  . amitriptyline (ELAVIL) 50 MG tablet TAKE 1 TABLET BY MOUTH EVERYDAY AT BEDTIME  . atorvastatin (LIPITOR) 40 MG tablet Take 1 tablet (40 mg total) by mouth daily.  Marland Kitchen b complex vitamins tablet Take 1 tablet by mouth daily.  Marland Kitchen ELIQUIS 5 MG TABS tablet TAKE 1 TABLET BY MOUTH TWICE A DAY  . folic acid (FOLVITE) 1 MG tablet Take 1 tablet (1 mg total) by mouth daily.  Marland Kitchen gabapentin (NEURONTIN) 600 MG tablet Take 1 tablet (600 mg total) by mouth 3 (three) times daily.  . Menthol 5 % PTCH Apply 1 patch topically as needed (back pain).  . metFORMIN (GLUCOPHAGE-XR) 500 MG 24 hr tablet Take two Tablets by mouth daily with supper  . metoprolol succinate (TOPROL-XL) 50 MG 24 hr tablet TAKE 1 TABLET BY MOUTH DAILY WITH  SUPPER. TAKE WITH OR IMMEDIATELY FOLLOWING A MEAL.  Marland Kitchen omeprazole (PRILOSEC) 20 MG capsule TAKE 1 CAPSULE BY MOUTH EVERY DAY  . ONETOUCH VERIO test strip USE TO TEST BLOOD SUGAR TWICE DAILY DX: E11.42  . ramipril (ALTACE) 1.25 MG capsule TAKE 1 CAPSULE BY MOUTH DAILY.  Marland Kitchen topiramate (TOPAMAX) 50 MG tablet TAKE 1 AND 1/2 TABLETS BY MOUTH DAILY  . vitamin B-12 (CYANOCOBALAMIN) 1000 MCG tablet Take 1,000 mcg by mouth daily.  . vitamin C (ASCORBIC ACID) 500 MG tablet Take 500 mg by mouth daily.    No current facility-administered medications for this visit. (Other)      REVIEW OF SYSTEMS:    ALLERGIES No Known Allergies  PAST MEDICAL HISTORY Past Medical History:  Diagnosis Date  . Chest pain   . Diabetes mellitus   . DM2 (diabetes mellitus, type 2) ([place])   . GERD (gastroesophageal reflux disease)   . HLD (hyperlipidemia)   . HTN (hypertension)   . PAF (paroxysmal atrial fibrillation) ([place])   . Palpitations    Past Surgical History:  Procedure Laterality Date  . APPENDECTOMY    . CHOLECYSTECTOMY    . COLONOSCOPY     Dr. Michail Sermon  . COLONOSCOPY WITH PROPOFOL N/A 11/06/2018   Procedure: COLONOSCOPY WITH PROPOFOL;  Surgeon: Wilford Corner, MD;  [facility]: [facility];  Service: Endoscopy;  Laterality:  N/A;  . HERNIA REPAIR    . LEG AMPUTATION Left [id number]   Dr. Kendell Bane  . POLYPECTOMY  11/06/2018   Procedure: POLYPECTOMY;  Surgeon: Wilford Corner, MD;  Location: [facility];  Service: Endoscopy;;  . SHOULDER SURGERY  [phone]   Dr. Meridee Score  . TOTAL ANKLE ARTHROPLASTY  [id number]    FAMILY HISTORY Family History  Problem Relation Age of Onset  . Heart disease Mother        Living  . Stroke Father 40       Deceased  . Diabetes Sister   . Cancer Sister     SOCIAL HISTORY Social History   Tobacco Use  . Smoking status: Former Smoker    Years: 12.00    Quit date: 10/10/1981    Years since quitting: 39.2  . Smokeless tobacco: Never Used  Vaping Use   . Vaping Use: Never used  Substance Use Topics  . Alcohol use: Yes    Comment: has glass of wine once a week but states rarely   . Drug use: No         OPHTHALMIC EXAM:  Base Eye Exam    Visual Acuity (Snellen - Linear)      Right Left   Dist [place] 20/25 20/30       Tonometry (Tonopen, 11:08 AM)      Right Left   Pressure 13 12       Neuro/Psych    Oriented x3: Yes   Mood/Affect: Normal       Dilation    Both eyes: 1.0% Mydriacyl, 2.5% Phenylephrine @ 11:08 AM        Slit Lamp and Fundus Exam    External Exam      Right Left   External Normal Normal       Slit Lamp Exam      Right Left   Lids/Lashes Normal Normal   Conjunctiva/Sclera White and quiet White and quiet   Cornea Clear Clear   Anterior Chamber Deep and quiet Deep and quiet   Iris Round and reactive Round and reactive   Lens Centered posterior chamber intraocular lens Centered posterior chamber intraocular lens   Anterior Vitreous Normal Normal       Fundus Exam      Right Left   Posterior Vitreous Posterior vitreous detachment Posterior vitreous detachment   Disc Normal Normal   C/D Ratio 0.65 0.65   Macula Early age related macular degeneration, Hard drusen, no membrane, no hemorrhage Intermediate age related macular degeneration, Geographic atrophy, Hard drusen   Vessels Normal, no DR Normal, no DR   Periphery Normal Normal          IMAGING AND PROCEDURES  Imaging and Procedures for 12/17/20  OCT, Retina - OU - Both Eyes       Right Eye Quality was good. Scan locations included subfoveal. Central Foveal Thickness: 232. Progression has been stable. Findings include no SRF, no IRF, abnormal foveal contour, retinal drusen .   Left Eye Quality was good. Scan locations included subfoveal. Central Foveal Thickness: 284. Findings include subretinal fluid, pigment epithelial detachment, subretinal scarring, abnormal foveal contour.   Notes OD with no signs of CNVM.    OS with  serous  retinal detachment and vascularized pigment epithelial detachment noted superiorly with extension into the macula.       Color Fundus Photography Optos - OU - Both Eyes       Right Eye Macula : retinal pigment epithelium  abnormalities, geographic atrophy, drusen. Vessels : normal observations. Periphery : normal observations.   Left Eye Progression has no prior data. Disc findings include normal observations. Macula : geographic atrophy, retinal pigment epithelium abnormalities, drusen. Vessels : normal observations. Periphery : normal observations.        Fluorescein Angiography Optos (Transit OS)       Injection:  500 mg Fluorescein Sodium 10 % injection   NDC: 9057549220   Route: Intravenous, Site: Left ArmRight Eye   Progression has no prior data. Mid/Late phase findings include window defect. Choroidal neovascularization is not present.   Left Eye   Progression has no prior data. Early phase findings include window defect. Mid/Late phase findings include window defect, leakage, choroidal neovascularization. Choroidal neovascularization is occult.   Notes Diffuse occult CNVM and a parafoveal region superior to the FAZ with extension of subretinal fluid left eye secondary leakage  Right eye with ARMD, no signs of CNVM       Intravitreal Injection, Pharmacologic Agent - OS - Left Eye       Time Out 12/17/2020. 11:43 AM. Confirmed correct patient, procedure, site, and patient consented.   Anesthesia Topical anesthesia was used. Anesthetic medications included [place] 3.5%.   Procedure Preparation included Ofloxacin , Tobramycin 0.3%, 10% betadine to eyelids, 5% betadine to ocular surface. A 30 gauge needle was used.   Injection:  2.5 mg Bevacizumab (AVASTIN) 2.5mg /0.39mL SOSY   NDC: 02725-366-44, Lot: [id number]   Route: Intravitreal, Site: Left Eye  Post-op Post injection exam found visual acuity of at least counting fingers. The patient tolerated the procedure  well. There were no complications. The patient received written and verbal post procedure care education. Post injection medications were not given.                 ASSESSMENT/PLAN:  No problem-specific Assessment & Plan notes found for this encounter.      ICD-10-CM   1. Exudative age-related macular degeneration of left eye with active choroidal neovascularization (HCC)  H35.3221 OCT, Retina - OU - Both Eyes    Color Fundus Photography Optos - OU - Both Eyes    Fluorescein Angiography Optos (Transit OS)    Fluorescein Sodium 10 % injection 500 mg    Intravitreal Injection, Pharmacologic Agent - OS - Left Eye    bevacizumab (AVASTIN) SOSY 2.5 mg  2. Advanced nonexudative age-related macular degeneration of both eyes without subfoveal involvement  H35.3133 Fluorescein Angiography Optos (Transit OS)    Fluorescein Sodium 10 % injection 500 mg    1.  Fluorescein angiography confirms presence of wet AMD with an occult CNVM subfoveal OS.  Commence injection today of antivegF, Avastin and follow-up in 5 weeks, OS  2.  3.  Ophthalmic Meds Ordered this visit:  Meds ordered this encounter  Medications  . Fluorescein Sodium 10 % injection 500 mg  . bevacizumab (AVASTIN) SOSY 2.5 mg       Return in about 5 weeks (around 01/21/2021) for dilate, OS, AVASTIN OCT.  There are no Patient Instructions on file for this visit.   Explained the diagnoses, plan, and follow up with the patient and they expressed understanding.  Patient expressed understanding of the importance of proper follow up care.   Clent Demark Kedar Sedano M.D. Diseases & Surgery of the Retina and Vitreous Retina & Diabetic [place] 12/17/20     Abbreviations: M myopia (nearsighted); A astigmatism; H hyperopia (farsighted); P presbyopia; Mrx spectacle prescription;  CTL contact lenses; OD right eye; OS  left eye; OU both eyes  XT exotropia; ET esotropia; PEK punctate epithelial keratitis; PEE punctate epithelial  erosions; DES dry eye syndrome; MGD meibomian gland dysfunction; ATs artificial tears; PFAT's preservative free artificial tears; [place] nuclear sclerotic cataract; PSC posterior subcapsular cataract; ERM epi-retinal membrane; PVD posterior vitreous detachment; RD retinal detachment; DM diabetes mellitus; DR diabetic retinopathy; NPDR non-proliferative diabetic retinopathy; PDR proliferative diabetic retinopathy; CSME clinically significant macular edema; DME diabetic macular edema; dbh dot blot hemorrhages; CWS cotton wool spot; POAG primary open angle glaucoma; C/D cup-to-disc ratio; HVF humphrey visual field; GVF goldmann visual field; OCT optical coherence tomography; IOP intraocular pressure; BRVO [place] retinal vein occlusion; CRVO central retinal vein occlusion; CRAO central retinal artery occlusion; BRAO branch retinal artery occlusion; RT retinal tear; SB scleral buckle; PPV pars plana vitrectomy; VH Vitreous hemorrhage; PRP panretinal laser photocoagulation; IVK intravitreal kenalog; VMT vitreomacular traction; MH Macular hole;  NVD neovascularization of the disc; NVE neovascularization elsewhere; AREDS age related eye disease study; ARMD age related macular degeneration; POAG primary open angle glaucoma; EBMD epithelial/anterior basement membrane dystrophy; ACIOL anterior chamber intraocular lens; IOL intraocular lens; PCIOL posterior chamber intraocular lens; Phaco/IOL phacoemulsification with intraocular lens placement; [place] photorefractive keratectomy; LASIK laser assisted in situ keratomileusis; HTN hypertension; DM diabetes mellitus; COPD chronic obstructive pulmonary disease

## 2021-01-07 NOTE — Progress Notes (Signed)
  Subjective:  Patient ID: Darrell Horne, male    DOB: 1946/02/11,  MRN: [id number]  Chief Complaint  Patient presents with  . Wound Check    F/U Rt ulcer check at 5th met base and leg shin -pt states," better"-same with pain; 5/10 -w/ redness at shin and drynss -no redness at 5th met base -no drainage Tx: none    75 y.o. male presents for wound care. Hx confirmed with patient.  Objective:  Physical Exam: Venous stasis wounds appear healed to the right lower extremity.  No warmth no erythema no signs of acute infection noted.  Mild xerosis   Assessment:   1. Venous stasis ulcer of other part of right lower leg limited to breakdown of skin with varicose veins (HCC)    Plan:  Patient was evaluated and treated and all questions answered.  Ulcer right foot, right leg venous stasis ulcer -Much improved.  At this point can proceed only with Tubigrip.  This was dispensed today.  Discussed the importance of gentle compression dressing to avoid further skin lesions.  Return in about 6 weeks (around 01/22/2021).

## 2021-01-19 DIAGNOSIS — I4891 Unspecified atrial fibrillation: Secondary | ICD-10-CM | POA: Diagnosis not present

## 2021-01-19 DIAGNOSIS — E1142 Type 2 diabetes mellitus with diabetic polyneuropathy: Secondary | ICD-10-CM | POA: Diagnosis not present

## 2021-01-19 DIAGNOSIS — E1143 Type 2 diabetes mellitus with diabetic autonomic (poly)neuropathy: Secondary | ICD-10-CM | POA: Diagnosis not present

## 2021-01-19 DIAGNOSIS — E1151 Type 2 diabetes mellitus with diabetic peripheral angiopathy without gangrene: Secondary | ICD-10-CM | POA: Diagnosis not present

## 2021-01-19 DIAGNOSIS — D6869 Other thrombophilia: Secondary | ICD-10-CM | POA: Diagnosis not present

## 2021-01-19 DIAGNOSIS — E785 Hyperlipidemia, unspecified: Secondary | ICD-10-CM | POA: Diagnosis not present

## 2021-01-19 DIAGNOSIS — R69 Illness, unspecified: Secondary | ICD-10-CM | POA: Diagnosis not present

## 2021-01-19 DIAGNOSIS — G4700 Insomnia, unspecified: Secondary | ICD-10-CM | POA: Diagnosis not present

## 2021-01-19 DIAGNOSIS — G546 Phantom limb syndrome with pain: Secondary | ICD-10-CM | POA: Diagnosis not present

## 2021-01-19 DIAGNOSIS — Z89512 Acquired absence of left leg below knee: Secondary | ICD-10-CM | POA: Diagnosis not present

## 2021-01-21 ENCOUNTER — Ambulatory Visit (INDEPENDENT_AMBULATORY_CARE_PROVIDER_SITE_OTHER): Payer: Medicare HMO | Admitting: Ophthalmology

## 2021-01-21 ENCOUNTER — Encounter (INDEPENDENT_AMBULATORY_CARE_PROVIDER_SITE_OTHER): Payer: Self-pay | Admitting: Ophthalmology

## 2021-01-21 ENCOUNTER — Other Ambulatory Visit: Payer: Self-pay

## 2021-01-21 DIAGNOSIS — H353221 Exudative age-related macular degeneration, left eye, with active choroidal neovascularization: Secondary | ICD-10-CM

## 2021-01-21 MED ORDER — BEVACIZUMAB 2.5 MG/0.1ML IZ SOSY
2.5000 mg | PREFILLED_SYRINGE | INTRAVITREAL | Status: AC | PRN
  Administered 2021-01-21: 2.5 mg via INTRAVITREAL

## 2021-01-21 NOTE — Assessment & Plan Note (Signed)
Today at 5 weeks post injection intravitreal Eylea Avastin, vastly improved macular findings and stability of acuity.  Resolution of the serous component of wet AMD.  We will repeat injection today and examination again in 7 weeks

## 2021-01-21 NOTE — Progress Notes (Signed)
01/21/2021     CHIEF COMPLAINT Patient presents for Retina Follow Up (5 wk fu / possible Avastin OS//Pt states VA OU stable since last visit. Pt denies FOL, floaters, or ocular pain OU. /Pt states, "My eyes are very dry. My VA is about the same."/A1C: 5.7/LBS: Does not check )   HISTORY OF PRESENT ILLNESS: Darrell Horne is a 75 y.o. male who presents to the clinic today for:   HPI    Retina Follow Up    Patient presents with  Wet AMD.  In left eye.  This started 5 weeks ago.  Duration of 5 weeks.  Since onset it is stable. Additional comments: 5 wk fu / possible Avastin OS  Pt states VA OU stable since last visit. Pt denies FOL, floaters, or ocular pain OU.  Pt states, "My eyes are very dry. My VA is about the same." A1C: 5.7 LBS: Does not check        Last edited by Kendra Opitz, COA on 01/21/2021  9:37 AM. (History)      Referring physician: Clent Jacks, MD [address] [place],  [place] [address]  HISTORICAL INFORMATION:   Selected notes from the MEDICAL RECORD NUMBER    Lab Results  Component Value Date   HGBA1C 5.8 (H) 08/28/2019     CURRENT MEDICATIONS: No current outpatient medications on file. (Ophthalmic Drugs)   No current facility-administered medications for this visit. (Ophthalmic Drugs)   Current Outpatient Medications (Other)  Medication Sig  . amitriptyline (ELAVIL) 50 MG tablet TAKE 1 TABLET BY MOUTH EVERYDAY AT BEDTIME  . atorvastatin (LIPITOR) 40 MG tablet Take 1 tablet (40 mg total) by mouth daily.  Marland Kitchen b complex vitamins tablet Take 1 tablet by mouth daily.  Marland Kitchen ELIQUIS 5 MG TABS tablet TAKE 1 TABLET BY MOUTH TWICE A DAY  . folic acid (FOLVITE) 1 MG tablet Take 1 tablet (1 mg total) by mouth daily.  Marland Kitchen gabapentin (NEURONTIN) 600 MG tablet Take 1 tablet (600 mg total) by mouth 3 (three) times daily.  . Menthol 5 % PTCH Apply 1 patch topically as needed (back pain).  . metFORMIN (GLUCOPHAGE-XR) 500 MG 24 hr tablet Take two Tablets by mouth  daily with supper  . metoprolol succinate (TOPROL-XL) 50 MG 24 hr tablet TAKE 1 TABLET BY MOUTH DAILY WITH SUPPER. TAKE WITH OR IMMEDIATELY FOLLOWING A MEAL.  Marland Kitchen omeprazole (PRILOSEC) 20 MG capsule TAKE 1 CAPSULE BY MOUTH EVERY DAY  . ONETOUCH VERIO test strip USE TO TEST BLOOD SUGAR TWICE DAILY DX: E11.42  . ramipril (ALTACE) 1.25 MG capsule TAKE 1 CAPSULE BY MOUTH DAILY.  Marland Kitchen topiramate (TOPAMAX) 50 MG tablet TAKE 1 AND 1/2 TABLETS BY MOUTH DAILY  . vitamin B-12 (CYANOCOBALAMIN) 1000 MCG tablet Take 1,000 mcg by mouth daily.  . vitamin C (ASCORBIC ACID) 500 MG tablet Take 500 mg by mouth daily.    No current facility-administered medications for this visit. (Other)      REVIEW OF SYSTEMS:    ALLERGIES No Known Allergies  PAST MEDICAL HISTORY Past Medical History:  Diagnosis Date  . Chest pain   . Diabetes mellitus   . DM2 (diabetes mellitus, type 2) ([place])   . GERD (gastroesophageal reflux disease)   . HLD (hyperlipidemia)   . HTN (hypertension)   . PAF (paroxysmal atrial fibrillation) ([place])   . Palpitations    Past Surgical History:  Procedure Laterality Date  . APPENDECTOMY    . CHOLECYSTECTOMY    .  COLONOSCOPY     Dr. Michail Sermon  . COLONOSCOPY WITH PROPOFOL N/A 11/06/2018   Procedure: COLONOSCOPY WITH PROPOFOL;  Surgeon: Wilford Corner, MD;  Location: [facility];  Service: Endoscopy;  Laterality: N/A;  . HERNIA REPAIR    . LEG AMPUTATION Left [id number]   Dr. Kendell Bane  . POLYPECTOMY  11/06/2018   Procedure: POLYPECTOMY;  Surgeon: Wilford Corner, MD;  Location: [facility];  Service: Endoscopy;;  . SHOULDER SURGERY  [phone]   Dr. Meridee Score  . TOTAL ANKLE ARTHROPLASTY  [id number]    FAMILY HISTORY Family History  Problem Relation Age of Onset  . Heart disease Mother        Living  . Stroke Father 50       Deceased  . Diabetes Sister   . Cancer Sister     SOCIAL HISTORY Social History   Tobacco Use  . Smoking status: Former Smoker    Years:  12.00    Quit date: 10/10/1981    Years since quitting: 39.3  . Smokeless tobacco: Never Used  Vaping Use  . Vaping Use: Never used  Substance Use Topics  . Alcohol use: Yes    Comment: has glass of wine once a week but states rarely   . Drug use: No         OPHTHALMIC EXAM: Base Eye Exam    Visual Acuity (ETDRS)      Right Left   Dist [place] 20/30 20/40   Dist ph [place] NI NI       Tonometry (Tonopen, 9:43 AM)      Right Left   Pressure 15 15       Pupils      Pupils Dark Light Shape React APD   Right PERRL 4.5 3 Round Brisk None   Left PERRL 4 3 Round Brisk None       Visual Fields (Counting fingers)      Left Right    Full Full       Extraocular Movement      Right Left    Full Full       Neuro/Psych    Oriented x3: Yes   Mood/Affect: Normal       Dilation    Left eye: 1.0% Mydriacyl, 2.5% Phenylephrine @ 9:43 AM        Slit Lamp and Fundus Exam    External Exam      Right Left   External Normal Normal       Slit Lamp Exam      Right Left   Lids/Lashes Normal Normal   Conjunctiva/Sclera White and quiet White and quiet   Cornea Clear Clear   Anterior Chamber Deep and quiet Deep and quiet   Iris Round and reactive Round and reactive   Lens Centered posterior chamber intraocular lens Centered posterior chamber intraocular lens   Anterior Vitreous Normal Normal       Fundus Exam      Right Left   Posterior Vitreous Posterior vitreous detachment Posterior vitreous detachment   Disc Normal Normal   C/D Ratio 0.65 0.65   Macula Early age related macular degeneration, Hard drusen, no membrane, no hemorrhage Intermediate age related macular degeneration, Geographic atrophy, Hard drusen   Vessels Normal, no DR Normal, no DR   Periphery Normal Normal          IMAGING AND PROCEDURES  Imaging and Procedures for 01/21/21  OCT, Retina - OU - Both Eyes  Right Eye Quality was good. Scan locations included subfoveal. Central Foveal Thickness:  226. Progression has been stable. Findings include no SRF, no IRF, abnormal foveal contour, retinal drusen .   Left Eye Quality was good. Scan locations included subfoveal. Central Foveal Thickness: 212. Progression has improved. Findings include pigment epithelial detachment, subretinal scarring, abnormal foveal contour, no SRF.   Notes OD with no signs of CNVM.   OS much improved, resolution of serous retinal detachment as compared to last visit some 5 weeks previous.  We will repeat injection today follow-up in 6 to 7 weeks       Intravitreal Injection, Pharmacologic Agent - OS - Left Eye       Time Out 01/21/2021. 10:15 AM. Confirmed correct patient, procedure, site, and patient consented.   Anesthesia Topical anesthesia was used. Anesthetic medications included Akten 3.5%.   Procedure Preparation included Ofloxacin , Tobramycin 0.3%, 10% betadine to eyelids, 5% betadine to ocular surface. A 30 gauge needle was used.   Injection:  2.5 mg Bevacizumab (AVASTIN) 2.5mg /0.71mL SOSY   NDC: 35361-443-15, Lot: [id number]   Route: Intravitreal, Site: Left Eye  Post-op Post injection exam found visual acuity of at least counting fingers. The patient tolerated the procedure well. There were no complications. The patient received written and verbal post procedure care education. Post injection medications were not given.                 ASSESSMENT/PLAN:  Exudative age-related macular degeneration of left eye with active choroidal neovascularization (HCC) Today at 5 weeks post injection intravitreal Eylea Avastin, vastly improved macular findings and stability of acuity.  Resolution of the serous component of wet AMD.  We will repeat injection today and examination again in 7 weeks      ICD-10-CM   1. Exudative age-related macular degeneration of left eye with active choroidal neovascularization (HCC)  H35.3221 OCT, Retina - OU - Both Eyes    Intravitreal Injection, Pharmacologic  Agent - OS - Left Eye    bevacizumab (AVASTIN) SOSY 2.5 mg    1.  Improved findings overall left eye, repeat injection intravitreal Avastin today  2.  3.  Ophthalmic Meds Ordered this visit:  Meds ordered this encounter  Medications  . bevacizumab (AVASTIN) SOSY 2.5 mg       Return in about 7 weeks (around 03/11/2021) for dilate, OS, AVASTIN OCT.  There are no Patient Instructions on file for this visit.   Explained the diagnoses, plan, and follow up with the patient and they expressed understanding.  Patient expressed understanding of the importance of proper follow up care.   Clent Demark Demetrius Mahler M.D. Diseases & Surgery of the Retina and Vitreous Retina & Diabetic [place] 01/21/21     Abbreviations: M myopia (nearsighted); A astigmatism; H hyperopia (farsighted); P presbyopia; Mrx spectacle prescription;  CTL contact lenses; OD right eye; OS left eye; OU both eyes  XT exotropia; ET esotropia; PEK punctate epithelial keratitis; PEE punctate epithelial erosions; DES dry eye syndrome; MGD meibomian gland dysfunction; ATs artificial tears; PFAT's preservative free artificial tears; [place] nuclear sclerotic cataract; PSC posterior subcapsular cataract; ERM epi-retinal membrane; PVD posterior vitreous detachment; RD retinal detachment; DM diabetes mellitus; DR diabetic retinopathy; NPDR non-proliferative diabetic retinopathy; PDR proliferative diabetic retinopathy; CSME clinically significant macular edema; DME diabetic macular edema; dbh dot blot hemorrhages; CWS cotton wool spot; POAG primary open angle glaucoma; C/D cup-to-disc ratio; HVF humphrey visual field; GVF goldmann visual field; OCT optical coherence tomography; IOP intraocular pressure; BRVO  Branch retinal vein occlusion; CRVO central retinal vein occlusion; CRAO central retinal artery occlusion; BRAO branch retinal artery occlusion; RT retinal tear; SB scleral buckle; PPV pars plana vitrectomy; VH Vitreous hemorrhage; PRP  panretinal laser photocoagulation; IVK intravitreal kenalog; VMT vitreomacular traction; MH Macular hole;  NVD neovascularization of the disc; NVE neovascularization elsewhere; AREDS age related eye disease study; ARMD age related macular degeneration; POAG primary open angle glaucoma; EBMD epithelial/anterior basement membrane dystrophy; ACIOL anterior chamber intraocular lens; IOL intraocular lens; PCIOL posterior chamber intraocular lens; Phaco/IOL phacoemulsification with intraocular lens placement; [place] photorefractive keratectomy; LASIK laser assisted in situ keratomileusis; HTN hypertension; DM diabetes mellitus; COPD chronic obstructive pulmonary disease

## 2021-01-26 ENCOUNTER — Ambulatory Visit: Payer: Medicare HMO | Admitting: Podiatry

## 2021-01-28 ENCOUNTER — Other Ambulatory Visit: Payer: Self-pay | Admitting: Family

## 2021-01-29 ENCOUNTER — Other Ambulatory Visit: Payer: Self-pay

## 2021-01-29 ENCOUNTER — Ambulatory Visit: Payer: Medicare HMO | Admitting: Podiatry

## 2021-01-29 ENCOUNTER — Other Ambulatory Visit: Payer: Self-pay | Admitting: Family

## 2021-01-29 ENCOUNTER — Telehealth: Payer: Self-pay | Admitting: *Deleted

## 2021-01-29 DIAGNOSIS — I83018 Varicose veins of right lower extremity with ulcer other part of lower leg: Secondary | ICD-10-CM

## 2021-01-29 DIAGNOSIS — Z89512 Acquired absence of left leg below knee: Secondary | ICD-10-CM

## 2021-01-29 DIAGNOSIS — L97512 Non-pressure chronic ulcer of other part of right foot with fat layer exposed: Secondary | ICD-10-CM | POA: Diagnosis not present

## 2021-01-29 DIAGNOSIS — L97811 Non-pressure chronic ulcer of other part of right lower leg limited to breakdown of skin: Secondary | ICD-10-CM

## 2021-01-29 NOTE — Telephone Encounter (Signed)
Ann called and left message on Clinical intake requesting Rx for Shrinkers for leg.   Called and LMOM to return call. Requesting details of Rx that needs to be written and where she gets these from. [facility] usually fax[facility] a Rx Request.   Awaiting call back.

## 2021-01-29 NOTE — Telephone Encounter (Signed)
Order signed for left leg shirinker  # 3

## 2021-01-29 NOTE — Telephone Encounter (Signed)
Order signed and faxed to [facility]

## 2021-01-29 NOTE — Telephone Encounter (Signed)
Ann called back and stated that we have ordered these for patient in the past. Stated that all we need to do is send a Rx to [facility]  Left Leg Shrinkers #3 [facility] # [phone] Fax: [phone]   Pended Order for Dinah's approval.

## 2021-02-10 ENCOUNTER — Other Ambulatory Visit: Payer: Self-pay | Admitting: Family

## 2021-02-10 DIAGNOSIS — G4700 Insomnia, unspecified: Secondary | ICD-10-CM

## 2021-02-10 DIAGNOSIS — G8929 Other chronic pain: Secondary | ICD-10-CM

## 2021-02-10 DIAGNOSIS — F32A Depression, unspecified: Secondary | ICD-10-CM

## 2021-02-10 DIAGNOSIS — R519 Headache, unspecified: Secondary | ICD-10-CM

## 2021-02-11 ENCOUNTER — Telehealth: Payer: Self-pay | Admitting: *Deleted

## 2021-02-11 NOTE — Telephone Encounter (Signed)
Received Prescription-Detailed Written Order from [facility] [phone] for Darrell Horne to fill out and fax back to Fax:[phone]. For patient's Shrinkers x 2 [id number] Left.  Placed in Darrell Horne's folder to review and sign.

## 2021-02-11 NOTE — Progress Notes (Signed)
  Subjective:  Patient ID: Darrell Horne, male    DOB: [birth year],  MRN: [id number]  No chief complaint on file.  75 y.o. male presents for wound care. Hx confirmed with patient.  Objective:  Physical Exam: Venous stasis wounds appear healed to the right lower extremity.  No warmth no erythema no signs of acute infection noted.  Mild xerosis   Assessment:   1. Venous stasis ulcer of other part of right lower leg limited to breakdown of skin with varicose veins (HCC)   2. Ulcer of right second toe with fat layer exposed ([place])    Plan:  Patient was evaluated and treated and all questions answered.  Ulcer right foot, right leg venous stasis ulcer -Leg appears mostly healed, small abrasion of the 2nd toe which should resolve without issue. Apply ointment and band-aid daily  No follow-ups on file.

## 2021-02-17 DIAGNOSIS — Z89512 Acquired absence of left leg below knee: Secondary | ICD-10-CM | POA: Diagnosis not present

## 2021-02-20 ENCOUNTER — Other Ambulatory Visit: Payer: Self-pay | Admitting: Family

## 2021-02-20 DIAGNOSIS — E1142 Type 2 diabetes mellitus with diabetic polyneuropathy: Secondary | ICD-10-CM

## 2021-02-23 ENCOUNTER — Ambulatory Visit: Payer: Medicare HMO | Admitting: Family

## 2021-02-25 ENCOUNTER — Other Ambulatory Visit: Payer: Self-pay | Admitting: Family

## 2021-02-25 DIAGNOSIS — G8929 Other chronic pain: Secondary | ICD-10-CM

## 2021-02-25 DIAGNOSIS — R519 Headache, unspecified: Secondary | ICD-10-CM

## 2021-03-16 ENCOUNTER — Ambulatory Visit (INDEPENDENT_AMBULATORY_CARE_PROVIDER_SITE_OTHER): Payer: Medicare HMO | Admitting: Ophthalmology

## 2021-03-16 ENCOUNTER — Other Ambulatory Visit: Payer: Self-pay

## 2021-03-16 ENCOUNTER — Encounter (INDEPENDENT_AMBULATORY_CARE_PROVIDER_SITE_OTHER): Payer: Self-pay | Admitting: Ophthalmology

## 2021-03-16 DIAGNOSIS — H353221 Exudative age-related macular degeneration, left eye, with active choroidal neovascularization: Secondary | ICD-10-CM

## 2021-03-16 DIAGNOSIS — H353111 Nonexudative age-related macular degeneration, right eye, early dry stage: Secondary | ICD-10-CM | POA: Diagnosis not present

## 2021-03-16 DIAGNOSIS — E119 Type 2 diabetes mellitus without complications: Secondary | ICD-10-CM | POA: Diagnosis not present

## 2021-03-16 MED ORDER — BEVACIZUMAB 2.5 MG/0.1ML IZ SOSY
2.5000 mg | PREFILLED_SYRINGE | INTRAVITREAL | Status: AC | PRN
  Administered 2021-03-16: 2.5 mg via INTRAVITREAL

## 2021-03-16 NOTE — Progress Notes (Signed)
03/16/2021     CHIEF COMPLAINT Patient presents for Retina Follow Up (8 Wk F/U OS, poss Avastin OS//Pt denies noticeable changes to [place] OU since last visit. Pt denies ocular pain, flashes of light, or floaters OU. //)   HISTORY OF PRESENT ILLNESS: Darrell Horne is a 75 y.o. male who presents to the clinic today for:   HPI    Retina Follow Up    Diagnosis: Wet AMD   Laterality: left eye   Onset: 8 weeks ago   Severity: mild   Duration: 8 weeks   Course: stable   Comments: 8 Wk F/U OS, poss Avastin OS  Pt denies noticeable changes to [place] OU since last visit. Pt denies ocular pain, flashes of light, or floaters OU.          Last edited by Rockie Neighbours, [place] on 03/16/2021 10:13 AM. (History)      Referring physician: Sandrea Hughs, NP [place],  [place] [address]  HISTORICAL INFORMATION:   Selected notes from the MEDICAL RECORD NUMBER    Lab Results  Component Value Date   HGBA1C 5.8 (H) 08/28/2019     CURRENT MEDICATIONS: No current outpatient medications on file. (Ophthalmic Drugs)   No current facility-administered medications for this visit. (Ophthalmic Drugs)   Current Outpatient Medications (Other)  Medication Sig  . amitriptyline (ELAVIL) 50 MG tablet TAKE 1 TABLET BY MOUTH EVERYDAY AT BEDTIME  . atorvastatin (LIPITOR) 40 MG tablet Take 1 tablet (40 mg total) by mouth daily.  Marland Kitchen b complex vitamins tablet Take 1 tablet by mouth daily.  Marland Kitchen ELIQUIS 5 MG TABS tablet TAKE 1 TABLET BY MOUTH TWICE A DAY  . folic acid (FOLVITE) 1 MG tablet Take 1 tablet (1 mg total) by mouth daily.  Marland Kitchen gabapentin (NEURONTIN) 600 MG tablet Take 1 tablet (600 mg total) by mouth 3 (three) times daily.  . Menthol 5 % PTCH Apply 1 patch topically as needed (back pain).  . metFORMIN (GLUCOPHAGE-XR) 500 MG 24 hr tablet Take two Tablets by mouth daily with supper  . metoprolol succinate (TOPROL-XL) 50 MG 24 hr tablet TAKE 1 TABLET BY MOUTH DAILY WITH SUPPER. TAKE WITH OR IMMEDIATELY  FOLLOWING A MEAL.  Marland Kitchen omeprazole (PRILOSEC) 20 MG capsule TAKE 1 CAPSULE BY MOUTH EVERY DAY  . ONETOUCH VERIO test strip USE TO TEST BLOOD SUGAR TWICE DAILY DX: E11.42  . ramipril (ALTACE) 1.25 MG capsule TAKE 1 CAPSULE BY MOUTH EVERY DAY  . topiramate (TOPAMAX) 50 MG tablet TAKE 1 AND 1/2 TABLETS BY MOUTH DAILY  . vitamin B-12 (CYANOCOBALAMIN) 1000 MCG tablet Take 1,000 mcg by mouth daily.  . vitamin C (ASCORBIC ACID) 500 MG tablet Take 500 mg by mouth daily.    No current facility-administered medications for this visit. (Other)      REVIEW OF SYSTEMS:    ALLERGIES No Known Allergies  PAST MEDICAL HISTORY Past Medical History:  Diagnosis Date  . Chest pain   . Diabetes mellitus   . DM2 (diabetes mellitus, type 2) ([place])   . GERD (gastroesophageal reflux disease)   . HLD (hyperlipidemia)   . HTN (hypertension)   . PAF (paroxysmal atrial fibrillation) ([place])   . Palpitations    Past Surgical History:  Procedure Laterality Date  . APPENDECTOMY    . CHOLECYSTECTOMY    . COLONOSCOPY     Dr. Michail Sermon  . COLONOSCOPY WITH PROPOFOL N/A 11/06/2018   Procedure: COLONOSCOPY WITH PROPOFOL;  Surgeon: Wilford Corner,  MD;  Location: [facility];  Service: Endoscopy;  Laterality: N/A;  . HERNIA REPAIR    . LEG AMPUTATION Left [id number]   Dr. Kendell Bane  . POLYPECTOMY  11/06/2018   Procedure: POLYPECTOMY;  Surgeon: Wilford Corner, MD;  Location: [facility];  Service: Endoscopy;;  . SHOULDER SURGERY  [phone]   Dr. Meridee Score  . TOTAL ANKLE ARTHROPLASTY  [id number]    FAMILY HISTORY Family History  Problem Relation Age of Onset  . Heart disease Mother        Living  . Stroke Father 46       Deceased  . Diabetes Sister   . Cancer Sister     SOCIAL HISTORY Social History   Tobacco Use  . Smoking status: Former Smoker    Years: 12.00    Quit date: 10/10/1981    Years since quitting: 39.4  . Smokeless tobacco: Never Used  Vaping Use  . Vaping Use: Never used   Substance Use Topics  . Alcohol use: Yes    Comment: has glass of wine once a week but states rarely   . Drug use: No         OPHTHALMIC EXAM:  Base Eye Exam    Visual Acuity (ETDRS)      Right Left   Dist [place] 20/25 -2 20/40   Dist ph [place]  20/40 +1       Tonometry (Tonopen, 10:17 AM)      Right Left   Pressure 12 08       Pupils      Dark Light Shape React APD   Right 5 3 Round Brisk None   Left 4 3 Round Brisk None       Visual Fields (Counting fingers)      Left Right    Full Full       Extraocular Movement      Right Left    Full Full       Neuro/Psych    Oriented x3: Yes   Mood/Affect: Normal       Dilation    Left eye: 1.0% Mydriacyl, 2.5% Phenylephrine @ 10:17 AM        Slit Lamp and Fundus Exam    External Exam      Right Left   External Normal Normal       Slit Lamp Exam      Right Left   Lids/Lashes Normal Normal   Conjunctiva/Sclera White and quiet White and quiet   Cornea Clear Clear   Anterior Chamber Deep and quiet Deep and quiet   Iris Round and reactive Round and reactive   Lens Centered posterior chamber intraocular lens Centered posterior chamber intraocular lens   Anterior Vitreous Normal Normal       Fundus Exam      Right Left   Posterior Vitreous  Posterior vitreous detachment   Disc  Normal   C/D Ratio  0.65   Macula  Intermediate age related macular degeneration, Geographic atrophy, Hard drusen, no macular thickening, no hemorrhage   Vessels  Normal, no DR   Periphery  Normal          IMAGING AND PROCEDURES  Imaging and Procedures for 03/16/21  OCT, Retina - OU - Both Eyes       Right Eye Quality was good. Scan locations included subfoveal. Central Foveal Thickness: 227. Progression has been stable. Findings include no SRF, no IRF, abnormal foveal contour, retinal drusen .   Left  Eye Quality was good. Scan locations included subfoveal. Central Foveal Thickness: 222. Progression has improved. Findings  include pigment epithelial detachment, subretinal scarring, abnormal foveal contour, no SRF.   Notes OD with no signs of CNVM.   OS much improved, resolution of serous retinal detachment as compared to last visit some 5 weeks previous.  We will repeat injection today follow-up in 6 to 7 weeks       Intravitreal Injection, Pharmacologic Agent - OS - Left Eye       Time Out 03/16/2021. 11:31 AM. Confirmed correct patient, procedure, site, and patient consented.   Anesthesia Topical anesthesia was used. Anesthetic medications included Akten 3.5%.   Procedure Preparation included Ofloxacin , Tobramycin 0.3%, 10% betadine to eyelids, 5% betadine to ocular surface. A 30 gauge needle was used.   Injection:  2.5 mg Bevacizumab (AVASTIN) 2.5mg /0.43mL SOSY   NDC: 22297-989-21, Lot: [id number]   Route: Intravitreal, Site: Left Eye  Post-op Post injection exam found visual acuity of at least counting fingers. The patient tolerated the procedure well. There were no complications. The patient received written and verbal post procedure care education. Post injection medications were not given.                 ASSESSMENT/PLAN:  Diabetes mellitus without complication (HCC) Detectable diabetic retinopathy today  Early stage nonexudative age-related macular degeneration of right eye No change by OCT  Exudative age-related macular degeneration of left eye with active choroidal neovascularization ([place]) Much less active as compared to onset of disease March 2022.  Much less subretinal fluid.  Repeat injection intravitreal Avastin today and maintain 7-week follow-up      ICD-10-CM   1. Exudative age-related macular degeneration of left eye with active choroidal neovascularization (HCC)  H35.3221 OCT, Retina - OU - Both Eyes    Intravitreal Injection, Pharmacologic Agent - OS - Left Eye    bevacizumab (AVASTIN) SOSY 2.5 mg  2. Diabetes mellitus without complication ([place])  C14.4   3. Early  stage nonexudative age-related macular degeneration of right eye  H35.3111     1.  OS overall vastly improved anatomically and visually stable as compared to onset of disease and treatment March 2022  2.  [place] injection intravitreal Avastin OS today and semination interval maintained at 7 weeks due to recent onset of disease and quick improvement in anatomy  3.  Ophthalmic Meds Ordered this visit:  Meds ordered this encounter  Medications  . bevacizumab (AVASTIN) SOSY 2.5 mg       Return in about 7 weeks (around 05/04/2021) for dilate, OS, AVASTIN OCT.  There are no Patient Instructions on file for this visit.   Explained the diagnoses, plan, and follow up with the patient and they expressed understanding.  Patient expressed understanding of the importance of proper follow up care.   Clent Demark Janiesha Diehl M.D. Diseases & Surgery of the Retina and Vitreous Retina & Diabetic [place] 03/16/21     Abbreviations: M myopia (nearsighted); A astigmatism; H hyperopia (farsighted); P presbyopia; Mrx spectacle prescription;  CTL contact lenses; OD right eye; OS left eye; OU both eyes  XT exotropia; ET esotropia; PEK punctate epithelial keratitis; PEE punctate epithelial erosions; DES dry eye syndrome; MGD meibomian gland dysfunction; ATs artificial tears; PFAT's preservative free artificial tears; [place] nuclear sclerotic cataract; PSC posterior subcapsular cataract; ERM epi-retinal membrane; PVD posterior vitreous detachment; RD retinal detachment; DM diabetes mellitus; DR diabetic retinopathy; NPDR non-proliferative diabetic retinopathy; PDR proliferative diabetic retinopathy; CSME clinically significant macular  edema; DME diabetic macular edema; dbh dot blot hemorrhages; CWS cotton wool spot; POAG primary open angle glaucoma; C/D cup-to-disc ratio; HVF humphrey visual field; GVF goldmann visual field; OCT optical coherence tomography; IOP intraocular pressure; BRVO Branch retinal vein occlusion;  CRVO central retinal vein occlusion; CRAO central retinal artery occlusion; BRAO branch retinal artery occlusion; RT retinal tear; SB scleral buckle; PPV pars plana vitrectomy; VH Vitreous hemorrhage; PRP panretinal laser photocoagulation; IVK intravitreal kenalog; VMT vitreomacular traction; MH Macular hole;  NVD neovascularization of the disc; NVE neovascularization elsewhere; AREDS age related eye disease study; ARMD age related macular degeneration; POAG primary open angle glaucoma; EBMD epithelial/anterior basement membrane dystrophy; ACIOL anterior chamber intraocular lens; IOL intraocular lens; PCIOL posterior chamber intraocular lens; Phaco/IOL phacoemulsification with intraocular lens placement; [place] photorefractive keratectomy; LASIK laser assisted in situ keratomileusis; HTN hypertension; DM diabetes mellitus; COPD chronic obstructive pulmonary disease

## 2021-03-16 NOTE — Assessment & Plan Note (Signed)
No change by OCT

## 2021-03-16 NOTE — Assessment & Plan Note (Signed)
Detectable diabetic retinopathy today

## 2021-03-16 NOTE — Assessment & Plan Note (Addendum)
Much less active as compared to onset of disease March 2022.  Much less subretinal fluid.  Repeat injection intravitreal Avastin today and maintain 7-week follow-up

## 2021-03-21 ENCOUNTER — Other Ambulatory Visit: Payer: Self-pay | Admitting: Family

## 2021-03-21 DIAGNOSIS — I10 Essential (primary) hypertension: Secondary | ICD-10-CM

## 2021-03-24 ENCOUNTER — Ambulatory Visit (INDEPENDENT_AMBULATORY_CARE_PROVIDER_SITE_OTHER): Payer: Medicare HMO | Admitting: Family

## 2021-03-24 ENCOUNTER — Other Ambulatory Visit: Payer: Self-pay

## 2021-03-24 ENCOUNTER — Encounter: Payer: Self-pay | Admitting: Family

## 2021-03-24 VITALS — BP 120/70 | HR 119 | Temp 97.1°F | Resp 18 | Ht 70.0 in | Wt 221.4 lb

## 2021-03-24 DIAGNOSIS — I10 Essential (primary) hypertension: Secondary | ICD-10-CM

## 2021-03-24 DIAGNOSIS — E782 Mixed hyperlipidemia: Secondary | ICD-10-CM

## 2021-03-24 DIAGNOSIS — I480 Paroxysmal atrial fibrillation: Secondary | ICD-10-CM

## 2021-03-24 DIAGNOSIS — E1142 Type 2 diabetes mellitus with diabetic polyneuropathy: Secondary | ICD-10-CM

## 2021-03-24 NOTE — Patient Instructions (Signed)
-   please get your COVID-19 booster vaccine at your pharmacy

## 2021-03-24 NOTE — Progress Notes (Signed)
Provider: Marlowe Sax FNP-C   Keevin Panebianco, Nelda Bucks, NP  Patient Care Team: Rejoice Heatwole, Nelda Bucks, NP as PCP - General (Family Medicine) Clent Jacks, MD as Consulting Physician (Ophthalmology) Alda Berthold, DO as Consulting Physician (Neurology)  Extended Emergency Contact Information Primary Emergency Contact: Rafter,Anna Address: [address]          [place], [place] [address] Johnnette Litter of [place] Phone: [phone] Relation: Spouse Secondary Emergency Contact: Thomasenia Bottoms [place] of [place] Phone: [phone] Relation: Sister  Code Status:  Full code  Goals of care: Advanced Directive information Advanced Directives 03/24/2021  Does Patient Have a Medical Advance Directive? Yes  Type of Paramedic of [place];Living will  Does patient want to make changes to medical advance directive? No - Patient declined  Copy of [place] in Chart? Yes - validated most recent copy scanned in chart (See row information)  Would patient like information on creating a medical advance directive? -     Chief Complaint  Patient presents with   Medical Management of Chronic Issues    6 month follow up.   Health Maintenance    Discuss the need for Hemoglobin A1C   Immunizations    Discuss the need for Covid Booster.    HPI:  Pt is a 75 y.o. male seen today for 6 months follow up for medical management of chronic diseases.Has medical history of Hypertension,Type 2 DM with Neuropathy,Hyperlipidemia,Afib,Macular degeneration,venous statis ulcer of RLE,GERD among other conditions.He denies any acute issues today.He states has been doing well. States continue to follow up with Podiatrist Dr.Price for right leg venous ulcer.Has upcoming appointment 04/02/2021.   drinks 3-4 protein supplements daily sometimes. Has had 2 lbs weight gain over 4 months.    He is fasting today for lab work.   Thinks he had a COVID-19 booster vaccine  but no records available per CMA called [facility].Had shingles vaccine 03/09/2021.      Past Medical History:  Diagnosis Date   Chest pain    Diabetes mellitus    DM2 (diabetes mellitus, type 2) (HCC)    GERD (gastroesophageal reflux disease)    HLD (hyperlipidemia)    HTN (hypertension)    PAF (paroxysmal atrial fibrillation) (HCC)    Palpitations    Past Surgical History:  Procedure Laterality Date   APPENDECTOMY     CHOLECYSTECTOMY     COLONOSCOPY     Dr. Michail Sermon   COLONOSCOPY WITH PROPOFOL N/A 11/06/2018   Procedure: COLONOSCOPY WITH PROPOFOL;  Surgeon: Wilford Corner, MD;  Location: [facility];  Service: Endoscopy;  Laterality: N/A;   HERNIA REPAIR     LEG AMPUTATION Left [id number]   Dr. Kendell Bane   POLYPECTOMY  11/06/2018   Procedure: POLYPECTOMY;  Surgeon: Wilford Corner, MD;  Location: [facility];  Service: Endoscopy;;   SHOULDER SURGERY  [id number]   Dr. Meridee Score   TOTAL ANKLE ARTHROPLASTY  [id number]    No Known Allergies  Allergies as of 03/24/2021   No Known Allergies      Medication List        Accurate as of March 24, 2021 10:29 AM. If you have any questions, ask your nurse or doctor.          amitriptyline 50 MG tablet Commonly known as: ELAVIL TAKE 1 TABLET BY MOUTH EVERYDAY AT BEDTIME   atorvastatin 40 MG tablet Commonly known as: LIPITOR Take 1 tablet (40 mg total) by mouth daily.  b complex vitamins tablet Take 1 tablet by mouth daily.   Eliquis 5 MG Tabs tablet Generic drug: apixaban TAKE 1 TABLET BY MOUTH TWICE A DAY   folic acid 1 MG tablet Commonly known as: FOLVITE Take 1 tablet (1 mg total) by mouth daily.   gabapentin 600 MG tablet Commonly known as: NEURONTIN Take 1 tablet (600 mg total) by mouth 3 (three) times daily.   Menthol 5 % Ptch Apply 1 patch topically as needed (back pain).   metFORMIN 500 MG 24 hr tablet Commonly known as: GLUCOPHAGE-XR Take two Tablets by mouth daily with supper    metoprolol succinate 50 MG 24 hr tablet Commonly known as: TOPROL-XL TAKE 1 TABLET BY MOUTH DAILY WITH SUPPER. TAKE WITH OR IMMEDIATELY FOLLOWING A MEAL.   omeprazole 20 MG capsule Commonly known as: PRILOSEC TAKE 1 CAPSULE BY MOUTH EVERY DAY   OneTouch Verio test strip Generic drug: glucose blood USE TO TEST BLOOD SUGAR TWICE DAILY DX: E11.42   ramipril 1.25 MG capsule Commonly known as: ALTACE TAKE 1 CAPSULE BY MOUTH EVERY DAY   topiramate 50 MG tablet Commonly known as: TOPAMAX TAKE 1 AND 1/2 TABLETS BY MOUTH DAILY   vitamin B-12 1000 MCG tablet Commonly known as: CYANOCOBALAMIN Take 1,000 mcg by mouth daily.   vitamin C 500 MG tablet Commonly known as: ASCORBIC ACID Take 500 mg by mouth daily.        Review of Systems  Constitutional:  Negative for appetite change, chills, fatigue, fever and unexpected weight change.  HENT:  Negative for congestion, dental problem, ear discharge, ear pain, facial swelling, hearing loss, nosebleeds, postnasal drip, rhinorrhea, sinus pressure, sinus pain, sneezing, sore throat, tinnitus and trouble swallowing.   Eyes:  Negative for pain, discharge, redness, itching and visual disturbance.  Respiratory:  Negative for cough, chest tightness, shortness of breath and wheezing.   Cardiovascular:  Negative for chest pain, palpitations and leg swelling.  Gastrointestinal:  Negative for abdominal distention, abdominal pain, blood in stool, constipation, diarrhea, nausea and vomiting.  Endocrine: Negative for cold intolerance, heat intolerance, polydipsia, polyphagia and polyuria.  Genitourinary:  Negative for difficulty urinating, dysuria, flank pain, frequency and urgency.  Musculoskeletal:  Positive for arthralgias and gait problem. Negative for back pain, joint swelling, myalgias, neck pain and neck stiffness.  Skin:  Negative for color change, pallor, rash and wound.  Neurological:  Negative for dizziness, syncope, speech difficulty,  weakness, light-headedness, numbness and headaches.  Hematological:  Does not bruise/bleed easily.  Psychiatric/Behavioral:  Negative for agitation, behavioral problems, confusion, hallucinations, self-injury, sleep disturbance and suicidal ideas. The patient is not nervous/anxious.    Immunization History  Administered Date(s) Administered   DTaP 10/10/2014   Fluad Quad(high Dose 65+) 08/28/2019, 08/07/2020   Influenza,inj,Quad PF,6+ Mos 08/05/2016   Influenza-Unspecified 10/10/2014   PFIZER(Purple Top)SARS-COV-2 Vaccination 01/28/2020, 02/18/2020   Pneumococcal Conjugate-13 11/11/2016   Pneumococcal-Unspecified 10/10/2014   Tdap 12/01/2020   Zoster Recombinat (Shingrix) 03/09/2021   Pertinent  Health Maintenance Due  Topic Date Due   HEMOGLOBIN A1C  02/25/2020   INFLUENZA VACCINE  05/10/2021   FOOT EXAM  05/19/2021   OPHTHALMOLOGY EXAM  03/16/2022   COLONOSCOPY (Pts 45-21yr Insurance coverage will need to be confirmed)  11/06/2028   PNA vac Low Risk Adult  Completed   Fall Risk  03/24/2021 12/14/2020 11/26/2020 10/08/2019 08/28/2019  Falls in the past year? 0 0 0 0 1  Number falls in past yr: 0 0 0 - 0  Injury with Fall?  0 0 0 - 0  Comment - - - - -  Risk Factor Category  - - - - -  Risk for fall due to : - - - Impaired balance/gait;Impaired mobility -  Follow up - - - Falls evaluation completed;Education provided;Falls prevention discussed -   Functional Status Survey:    Vitals:   03/24/21 1007  BP: 120/70  Pulse: (!) 119  Resp: 18  Temp: (!) 97.1 F (36.2 C)  SpO2: 97%  Weight: 221 lb 6.4 oz (100.4 kg)  Height: _0  (1.778 m)   Body mass index is 31.77 kg/m. Physical Exam Vitals reviewed.  Constitutional:      General: He is not in acute distress.    Appearance: Normal appearance. He is obese. He is not ill-appearing or diaphoretic.  HENT:     Head: Normocephalic.     Right Ear: Tympanic membrane, ear canal and external ear normal. There is no impacted  cerumen.     Left Ear: Tympanic membrane, ear canal and external ear normal. There is no impacted cerumen.     Nose: Nose normal. No congestion or rhinorrhea.     Mouth/Throat:     Mouth: Mucous membranes are moist.     Pharynx: Oropharynx is clear. No oropharyngeal exudate or posterior oropharyngeal erythema.  Eyes:     General: No scleral icterus.       Right eye: No discharge.        Left eye: No discharge.     Extraocular Movements: Extraocular movements intact.     Conjunctiva/sclera: Conjunctivae normal.     Pupils: Pupils are equal, round, and reactive to light.  Neck:     Vascular: No carotid bruit.  Cardiovascular:     Rate and Rhythm: Normal rate and regular rhythm.     Pulses: Normal pulses.     Heart sounds: Normal heart sounds. No murmur heard.   No friction rub. No gallop.  Pulmonary:     Effort: Pulmonary effort is normal. No respiratory distress.     Breath sounds: Normal breath sounds. No wheezing, rhonchi or rales.  Chest:     Chest wall: No tenderness.  Abdominal:     General: Bowel sounds are normal. There is no distension.     Palpations: Abdomen is soft. There is no mass.     Tenderness: There is no abdominal tenderness. There is no right CVA tenderness, left CVA tenderness, guarding or rebound.  Musculoskeletal:        General: No swelling or tenderness.     Cervical back: Normal range of motion. No rigidity or tenderness.     Right lower leg: No edema.     Comments: Unsteady gait      Left Lower Extremity: Left leg is amputated below knee.  Feet:     Comments: Right leg dressing clean dry and intact  Lymphadenopathy:     Cervical: No cervical adenopathy.  Skin:    General: Skin is warm and dry.     Coloration: Skin is not pale.     Findings: No bruising, erythema, lesion or rash.  Neurological:     Mental Status: He is alert and oriented to person, place, and time.     Cranial Nerves: No cranial nerve deficit.     Motor: No weakness.      Coordination: Coordination normal.     Gait: Gait abnormal.  Psychiatric:        Mood and Affect: Mood normal.  Speech: Speech normal.        Behavior: Behavior normal.        Thought Content: Thought content normal.        Judgment: Judgment normal.    Labs reviewed: Recent Labs    08/07/20 1037  NA 140  K 3.9  CL 107  CO2 28  GLUCOSE 92  BUN 5*  CREATININE 0.48*  CALCIUM 8.4*   Recent Labs    08/07/20 1037  AST 20  ALT 11  BILITOT 0.9  PROT 5.7*   Recent Labs    08/07/20 1037  WBC 4.8  NEUTROABS 2,995  HGB 12.3*  HCT 36.0*  MCV 99.7  PLT 71*   Lab Results  Component Value Date   TSH 1.38 08/07/2020   Lab Results  Component Value Date   HGBA1C 5.8 (H) 08/28/2019   Lab Results  Component Value Date   CHOL 89 08/07/2020   HDL 40 08/07/2020   LDLCALC 35 08/07/2020   TRIG 60 08/07/2020   CHOLHDL 2.2 08/07/2020    Significant Diagnostic Results in last 30 days:  Intravitreal Injection, Pharmacologic Agent - OS - Left Eye  Result Date: 03/16/2021 Time Out 03/16/2021. 11:31 AM. Confirmed correct patient, procedure, site, and patient consented. Anesthesia Topical anesthesia was used. Anesthetic medications included Akten 3.5%. Procedure Preparation included Ofloxacin , Tobramycin 0.3%, 10% betadine to eyelids, 5% betadine to ocular surface. A 30 gauge needle was used. Injection: 2.5 mg Bevacizumab (AVASTIN) 2.60m/0.1mL SOSY   NDC: 727782-423-53 Lot:: [id number]  Route: Intravitreal, Site: Left Eye Post-op Post injection exam found visual acuity of at least counting fingers. The patient tolerated the procedure well. There were no complications. The patient received written and verbal post procedure care education. Post injection medications were not given.   OCT, Retina - OU - Both Eyes  Result Date: 03/16/2021 Right Eye Quality was good. Scan locations included subfoveal. Central Foveal Thickness: 227. Progression has been stable. Findings include no SRF, no  IRF, abnormal foveal contour, retinal drusen . Left Eye Quality was good. Scan locations included subfoveal. Central Foveal Thickness: 222. Progression has improved. Findings include pigment epithelial detachment, subretinal scarring, abnormal foveal contour, no SRF. Notes OD with no signs of CNVM. OS much improved, resolution of serous retinal detachment as compared to last visit some 5 weeks previous.  We will repeat injection today follow-up in 6 to 7 weeks   Assessment/Plan 1. Essential hypertension B/p well controlled  - continue on metoprolol and Ramipril  - CBC with Differential/Platelet - CMP with eGFR([facility])  2. Type 2 diabetes mellitus with diabetic polyneuropathy, unspecified whether long term insulin use (HCC) Controlled  - continue on metformin  - on ACE inhibitor for renal protection  - continue on Gabapentin for Neuropathy  - continue on Statin  - follow up with Podiatrist Dr.Price for right leg ulcer  - TSH - Hemoglobin A1c  3. Mixed hyperlipidemia Latest LDL at goal  - continue on Atorvastatin  - Lipid Panel  4. PAF (paroxysmal atrial fibrillation) (HCC) Continue on Eliquis and Metoprolol  No bleeding side effects reported  - CBC with Differential/Platelet - CMP with eGFR([facility])  Family/ staff Communication: Reviewed plan of care with patient verbalized understanding   Labs/tests ordered: Fasting labs today :  - CBC with Differential/Platelet - CMP with eGFR([facility]) - TSH - Hgb A1C - Lipid panel  Next Appointment : 6 months for medical management of chronic issues.  DSandrea Hughs NP

## 2021-03-25 LAB — CBC WITH DIFFERENTIAL/PLATELET
Absolute Monocytes: 449 cells/uL (ref 200–950)
Basophils Absolute: 10 cells/uL (ref 0–200)
Basophils Relative: 0.2 %
Eosinophils Absolute: 20 cells/uL (ref 15–500)
Eosinophils Relative: 0.4 %
HCT: 42.3 % (ref 38.5–50.0)
Hemoglobin: 13.9 g/dL (ref 13.2–17.1)
Lymphs Abs: 1056 cells/uL (ref 850–3900)
MCH: 32 pg (ref 27.0–33.0)
MCHC: 32.9 g/dL (ref 32.0–36.0)
MCV: 97.5 fL (ref 80.0–100.0)
MPV: 12.6 fL — ABNORMAL HIGH (ref 7.5–12.5)
Monocytes Relative: 8.8 %
Neutro Abs: 3565 cells/uL (ref 1500–7800)
Neutrophils Relative %: 69.9 %
Platelets: 87 10*3/uL — ABNORMAL LOW (ref 140–400)
RBC: 4.34 10*6/uL (ref 4.20–5.80)
RDW: 14.5 % (ref 11.0–15.0)
Total Lymphocyte: 20.7 %
WBC: 5.1 10*3/uL (ref 3.8–10.8)

## 2021-03-25 LAB — COMPLETE METABOLIC PANEL WITH GFR
AG Ratio: 1.1 (calc) (ref 1.0–2.5)
ALT: 13 U/L (ref 9–46)
AST: 20 U/L (ref 10–35)
Albumin: 3.4 g/dL — ABNORMAL LOW (ref 3.6–5.1)
Alkaline phosphatase (APISO): 152 U/L — ABNORMAL HIGH (ref 35–144)
BUN/Creatinine Ratio: 8 (calc) (ref 6–22)
BUN: 5 mg/dL — ABNORMAL LOW (ref 7–25)
CO2: 25 mmol/L (ref 20–32)
Calcium: 8.6 mg/dL (ref 8.6–10.3)
Chloride: 102 mmol/L (ref 98–110)
Creat: 0.66 mg/dL — ABNORMAL LOW (ref 0.70–1.18)
GFR, Est African American: 110 mL/min/{1.73_m2} (ref >=60)
GFR, Est Non African American: 95 mL/min/{1.73_m2} (ref >=60)
Globulin: 3.2 g/dL (calc) (ref 1.9–3.7)
Glucose, Bld: 110 mg/dL — ABNORMAL HIGH (ref 65–99)
Potassium: 4.1 mmol/L (ref 3.5–5.3)
Sodium: 135 mmol/L (ref 135–146)
Total Bilirubin: 1.6 mg/dL — ABNORMAL HIGH (ref 0.2–1.2)
Total Protein: 6.6 g/dL (ref 6.1–8.1)

## 2021-03-25 LAB — TSH: TSH: 1.39 mIU/L (ref 0.40–4.50)

## 2021-03-25 LAB — LIPID PANEL
Cholesterol: 158 mg/dL (ref <200)
HDL: 72 mg/dL (ref >=40)
LDL Cholesterol (Calc): 70 mg/dL (calc)
Non-HDL Cholesterol (Calc): 86 mg/dL (calc) (ref <130)
Total CHOL/HDL Ratio: 2.2 (calc) (ref <5.0)
Triglycerides: 81 mg/dL (ref <150)

## 2021-03-25 LAB — HEMOGLOBIN A1C
Hgb A1c MFr Bld: 5.6 % of total Hgb (ref <5.7)
Mean Plasma Glucose: 114 mg/dL
eAG (mmol/L): 6.3 mmol/L

## 2021-04-02 ENCOUNTER — Ambulatory Visit: Payer: Medicare HMO | Admitting: Podiatry

## 2021-04-02 ENCOUNTER — Other Ambulatory Visit: Payer: Self-pay

## 2021-04-02 DIAGNOSIS — I83018 Varicose veins of right lower extremity with ulcer other part of lower leg: Secondary | ICD-10-CM | POA: Diagnosis not present

## 2021-04-02 DIAGNOSIS — L97811 Non-pressure chronic ulcer of other part of right lower leg limited to breakdown of skin: Secondary | ICD-10-CM | POA: Diagnosis not present

## 2021-04-02 DIAGNOSIS — L97512 Non-pressure chronic ulcer of other part of right foot with fat layer exposed: Secondary | ICD-10-CM | POA: Diagnosis not present

## 2021-04-02 DIAGNOSIS — L97511 Non-pressure chronic ulcer of other part of right foot limited to breakdown of skin: Secondary | ICD-10-CM | POA: Diagnosis not present

## 2021-04-03 NOTE — Progress Notes (Signed)
  Subjective:  Patient ID: Darrell Horne, male    DOB: 03-15-1946,  MRN: [id number]  Chief Complaint  Patient presents with   Diabetic Ulcer    Pt is inoffice today to have Dr. March Rummage recheck the sore on his right 2nd toe and the lateral side of his right ankle. Pt states the sores come and goes constantly.    75 y.o. male presents for wound care. Hx confirmed with patient.  Objective:  Physical Exam: Venous stasis wound recurrence, no warmth no erythema no signs of acute infection noted. Superficial 2nd toe ulceration without probe to bone, mild toe edema, no erythema. Assessment:   1. Venous stasis ulcer of other part of right lower leg limited to breakdown of skin with varicose veins (HCC)   2. Ulcer of right second toe with fat layer exposed ([place])   3. Skin ulcer of right great toe, limited to breakdown of skin [facility])    Plan:  Patient was evaluated and treated and all questions answered.  Ulcer right foot, right leg venous stasis ulcer -Leg ulcers slightly worsened. -Unna boot applied for healing of VLUs. Applied with loose ACE bandage. -He does have a 2nd toe PIPJ ulceration. No debridement indicated. Wound cleansed, dressed with medihoney and band-aid. Tubefoam dispensed to offload the ulceration XRs needed at follow-up: 2 view Foot  Return in about 3 weeks (around 04/23/2021) for Wound Care, with XRs.

## 2021-04-23 ENCOUNTER — Other Ambulatory Visit: Payer: Self-pay

## 2021-04-23 ENCOUNTER — Other Ambulatory Visit: Payer: Self-pay | Admitting: Podiatry

## 2021-04-23 ENCOUNTER — Ambulatory Visit (INDEPENDENT_AMBULATORY_CARE_PROVIDER_SITE_OTHER): Payer: Medicare HMO

## 2021-04-23 ENCOUNTER — Ambulatory Visit: Payer: Medicare HMO | Admitting: Podiatry

## 2021-04-23 DIAGNOSIS — L97811 Non-pressure chronic ulcer of other part of right lower leg limited to breakdown of skin: Secondary | ICD-10-CM

## 2021-04-23 DIAGNOSIS — I83018 Varicose veins of right lower extremity with ulcer other part of lower leg: Secondary | ICD-10-CM

## 2021-04-23 DIAGNOSIS — Z5189 Encounter for other specified aftercare: Secondary | ICD-10-CM

## 2021-04-23 DIAGNOSIS — L97512 Non-pressure chronic ulcer of other part of right foot with fat layer exposed: Secondary | ICD-10-CM

## 2021-04-30 NOTE — Progress Notes (Signed)
  Subjective:  Patient ID: Darrell Horne, male    DOB: Dec 20, 1945,  MRN: [id number]  Chief Complaint  Patient presents with   wound care     Wound care f/u patient states since the last visit his wound looks worst - some discharge ( yellowish color )   75 y.o. male presents for wound care. Hx confirmed with patient.  Objective:  Physical Exam: Venous stasis wound recurrence, no warmth no erythema no signs of acute infection noted. Superficial 2nd toe ulceration without probe to bone, mild toe edema, no erythema. Assessment:   1. Encounter for wound care   2. Venous stasis ulcer of other part of right lower leg limited to breakdown of skin with varicose veins (HCC)   3. Ulcer of right second toe with fat layer exposed ([place])    Plan:  Patient was evaluated and treated and all questions answered.  Ulcer right foot, right leg venous stasis ulcer -Leg ulcers again worsened, 2nd toe ulcer improved. -Xrs reviewed no evidence of osteomyelitis -Unna boot re-applied for healing of VLUs. Applied with loose ACE bandage.  Return in about 2 weeks (around 05/07/2021) for [employer] change.

## 2021-05-04 ENCOUNTER — Ambulatory Visit (INDEPENDENT_AMBULATORY_CARE_PROVIDER_SITE_OTHER): Payer: Medicare HMO | Admitting: Ophthalmology

## 2021-05-04 ENCOUNTER — Other Ambulatory Visit: Payer: Self-pay

## 2021-05-04 ENCOUNTER — Encounter (INDEPENDENT_AMBULATORY_CARE_PROVIDER_SITE_OTHER): Payer: Self-pay | Admitting: Ophthalmology

## 2021-05-04 DIAGNOSIS — H353111 Nonexudative age-related macular degeneration, right eye, early dry stage: Secondary | ICD-10-CM

## 2021-05-04 DIAGNOSIS — H353221 Exudative age-related macular degeneration, left eye, with active choroidal neovascularization: Secondary | ICD-10-CM

## 2021-05-04 MED ORDER — BEVACIZUMAB 2.5 MG/0.1ML IZ SOSY
2.5000 mg | PREFILLED_SYRINGE | INTRAVITREAL | Status: AC | PRN
  Administered 2021-05-04: 2.5 mg via INTRAVITREAL

## 2021-05-04 NOTE — Assessment & Plan Note (Signed)
Much less disease activity superior to the foveal avascular zone OS today at 7-week follow-up interval post Avastin.  Repeat Avastin today and extend interval examination next 8 weeks

## 2021-05-04 NOTE — Progress Notes (Signed)
05/04/2021     CHIEF COMPLAINT Patient presents for Retina Follow Up (7 week fu OS and Avastin OS/Pt states VA OU stable since last visit. Pt denies FOL, floaters, or ocular pain OU. /A1C:5.7/LBS:not sure/)   HISTORY OF PRESENT ILLNESS: Darrell Horne is a 75 y.o. male who presents to the clinic today for:   HPI     Retina Follow Up           Diagnosis: Wet AMD   Laterality: left eye   Onset: 7 weeks ago   Severity: mild   Duration: 7 weeks   Course: stable   Comments: 7 week fu OS and Avastin OS Pt states VA OU stable since last visit. Pt denies FOL, floaters, or ocular pain OU.  A1C:5.7 LBS:not sure        Last edited by Kendra Opitz, COA on 05/04/2021 11:11 AM.      Referring physician: Sandrea Hughs, NP [place],  [place] [address]  HISTORICAL INFORMATION:   Selected notes from the MEDICAL RECORD NUMBER    Lab Results  Component Value Date   HGBA1C 5.6 03/24/2021     CURRENT MEDICATIONS: No current outpatient medications on file. (Ophthalmic Drugs)   No current facility-administered medications for this visit. (Ophthalmic Drugs)   Current Outpatient Medications (Other)  Medication Sig   amitriptyline (ELAVIL) 50 MG tablet TAKE 1 TABLET BY MOUTH EVERYDAY AT BEDTIME   atorvastatin (LIPITOR) 40 MG tablet Take 1 tablet (40 mg total) by mouth daily.   b complex vitamins tablet Take 1 tablet by mouth daily.   ELIQUIS 5 MG TABS tablet TAKE 1 TABLET BY MOUTH TWICE A DAY   folic acid (FOLVITE) 1 MG tablet Take 1 tablet (1 mg total) by mouth daily.   gabapentin (NEURONTIN) 600 MG tablet Take 1 tablet (600 mg total) by mouth 3 (three) times daily.   Menthol 5 % PTCH Apply 1 patch topically as needed (back pain).   metFORMIN (GLUCOPHAGE-XR) 500 MG 24 hr tablet Take two Tablets by mouth daily with supper   metoprolol succinate (TOPROL-XL) 50 MG 24 hr tablet TAKE 1 TABLET BY MOUTH DAILY WITH SUPPER. TAKE WITH OR IMMEDIATELY FOLLOWING A MEAL.    omeprazole (PRILOSEC) 20 MG capsule TAKE 1 CAPSULE BY MOUTH EVERY DAY   ONETOUCH VERIO test strip USE TO TEST BLOOD SUGAR TWICE DAILY DX: E11.42   ramipril (ALTACE) 1.25 MG capsule TAKE 1 CAPSULE BY MOUTH EVERY DAY   topiramate (TOPAMAX) 50 MG tablet TAKE 1 AND 1/2 TABLETS BY MOUTH DAILY   vitamin B-12 (CYANOCOBALAMIN) 1000 MCG tablet Take 1,000 mcg by mouth daily.   vitamin C (ASCORBIC ACID) 500 MG tablet Take 500 mg by mouth daily.    No current facility-administered medications for this visit. (Other)      REVIEW OF SYSTEMS:    ALLERGIES No Known Allergies  PAST MEDICAL HISTORY Past Medical History:  Diagnosis Date   Chest pain    Diabetes mellitus    DM2 (diabetes mellitus, type 2) (HCC)    GERD (gastroesophageal reflux disease)    HLD (hyperlipidemia)    HTN (hypertension)    PAF (paroxysmal atrial fibrillation) (HCC)    Palpitations    Past Surgical History:  Procedure Laterality Date   APPENDECTOMY     CHOLECYSTECTOMY     COLONOSCOPY     Dr. Michail Sermon   COLONOSCOPY WITH PROPOFOL N/A 11/06/2018   Procedure: COLONOSCOPY WITH PROPOFOL;  Surgeon: Michail Sermon,  Evette Doffing, MD;  Location: [facility];  Service: Endoscopy;  Laterality: N/A;   HERNIA REPAIR     LEG AMPUTATION Left [id number]   Dr. Kendell Bane   POLYPECTOMY  11/06/2018   Procedure: POLYPECTOMY;  Surgeon: Wilford Corner, MD;  Location: [facility];  Service: Endoscopy;;   SHOULDER SURGERY  [id number]   Dr. Meridee Score   TOTAL ANKLE ARTHROPLASTY  [id number]    FAMILY HISTORY Family History  Problem Relation Age of Onset   Heart disease Mother        Living   Stroke Father 88       Deceased   Diabetes Sister    Cancer Sister     SOCIAL HISTORY Social History   Tobacco Use   Smoking status: Former    Years: 12.00    Types: Cigarettes    Quit date: 10/10/1981    Years since quitting: 39.5   Smokeless tobacco: Never  Vaping Use   Vaping Use: Never used  Substance Use Topics   Alcohol use: Yes     Comment: has glass of wine once a week but states rarely    Drug use: No         OPHTHALMIC EXAM:  Base Eye Exam     Visual Acuity (ETDRS)       Right Left   Dist [place] 20/25 -2 20/30 -1   Dist ph [place]  NI         Tonometry (Tonopen, 11:13 AM)       Right Left   Pressure 13 13         Pupils       Pupils Dark Light Shape React APD   Right PERRL 5 4 Round Brisk None   Left PERRL 4 3 Round Brisk None         Visual Fields (Counting fingers)       Left Right    Full Full         Extraocular Movement       Right Left    Full Full         Neuro/Psych     Oriented x3: Yes   Mood/Affect: Normal         Dilation     Left eye: 1.0% Mydriacyl, 2.5% Phenylephrine @ 11:13 AM           Slit Lamp and Fundus Exam     External Exam       Right Left   External Normal Normal         Slit Lamp Exam       Right Left   Lids/Lashes Normal Normal   Conjunctiva/Sclera White and quiet White and quiet   Cornea Clear Clear   Anterior Chamber Deep and quiet Deep and quiet   Iris Round and reactive Round and reactive   Lens Centered posterior chamber intraocular lens Centered posterior chamber intraocular lens   Anterior Vitreous Normal Normal         Fundus Exam       Right Left   Posterior Vitreous  Posterior vitreous detachment   Disc  Normal   C/D Ratio  0.65   Macula  Intermediate age related macular degeneration, Geographic atrophy, Hard drusen, no macular thickening, no hemorrhage   Vessels  Normal, no DR   Periphery  Normal            IMAGING AND PROCEDURES  Imaging and Procedures for 05/04/21  OCT, Retina - OU -  Both Eyes       Right Eye Quality was good. Scan locations included subfoveal. Central Foveal Thickness: 227. Progression has been stable. Findings include no SRF, no IRF, abnormal foveal contour, retinal drusen .   Left Eye Quality was good. Scan locations included subfoveal. Central Foveal Thickness: 222.  Progression has improved. Findings include pigment epithelial detachment, subretinal scarring, abnormal foveal contour, no SRF.   Notes OD with no signs of CNVM.   OS much improved, resolution of serous retinal detachment as compared to last visit some 5 weeks previous.  We will repeat injection today follow-up in 7 weeks     Intravitreal Injection, Pharmacologic Agent - OS - Left Eye       Time Out 05/04/2021. 11:22 AM. Confirmed correct patient, procedure, site, and patient consented.   Anesthesia Topical anesthesia was used. Anesthetic medications included [place] 3.5%.   Procedure Preparation included Ofloxacin , Tobramycin 0.3%, 10% betadine to eyelids, 5% betadine to ocular surface. A 30 gauge needle was used.   Injection: 2.5 mg bevacizumab 2.5 MG/0.1ML   Route: Intravitreal, Site: Left Eye   NDC: [phone], Lot: [id number]   Post-op Post injection exam found visual acuity of at least counting fingers. The patient tolerated the procedure well. There were no complications. The patient received written and verbal post procedure care education. Post injection medications were not given.              ASSESSMENT/PLAN:  Early stage nonexudative age-related macular degeneration of right eye No signs of CNVM OD by OCT today  Exudative age-related macular degeneration of left eye with active choroidal neovascularization (HCC) Much less disease activity superior to the foveal avascular zone OS today at 7-week follow-up interval post Avastin.  Repeat Avastin today and extend interval examination next 8 weeks     ICD-10-CM   1. Exudative age-related macular degeneration of left eye with active choroidal neovascularization (HCC)  H35.3221 OCT, Retina - OU - Both Eyes    Intravitreal Injection, Pharmacologic Agent - OS - Left Eye    bevacizumab (AVASTIN) SOSY 2.5 mg    2. Early stage nonexudative age-related macular degeneration of right eye  H35.3111       1.  OS continues  with steady improvement in macular condition with active CNVM and vascularized PED superior to the fovea.  Currently at 7-week follow-up interval.  We will asked patient to repeat exam in 8 weeks after intravitreal Avastin delivered OS today  2.  No signs of CNVM by OCT OD    3.  Dilate OU next  Ophthalmic Meds Ordered this visit:  Meds ordered this encounter  Medications   bevacizumab (AVASTIN) SOSY 2.5 mg       Return in about 8 weeks (around 06/29/2021) for DILATE OU, AVASTIN OCT, OS.  There are no Patient Instructions on file for this visit.   Explained the diagnoses, plan, and follow up with the patient and they expressed understanding.  Patient expressed understanding of the importance of proper follow up care.   Clent Demark Treylen Gibbs M.D. Diseases & Surgery of the Retina and Vitreous [facility]ina [facility] Diabetic [place] 05/04/21     Abbreviations: M myopia (nearsighted); A astigmatism; H hyperopia (farsighted); P presbyopia; Mrx spectacle prescription;  CTL contact lenses; OD right eye; OS left eye; OU both eyes  XT exotropia; ET esotropia; PEK punctate epithelial keratitis; PEE punctate epithelial erosions; DES dry eye syndrome; MGD meibomian gland dysfunction; ATs artificial tears; PFAT's preservative free artificial tears; [place]  nuclear sclerotic cataract; PSC posterior subcapsular cataract; ERM epi-retinal membrane; PVD posterior vitreous detachment; RD retinal detachment; DM diabetes mellitus; DR diabetic retinopathy; NPDR non-proliferative diabetic retinopathy; PDR proliferative diabetic retinopathy; CSME clinically significant macular edema; DME diabetic macular edema; dbh dot blot hemorrhages; CWS cotton wool spot; POAG primary open angle glaucoma; C/D cup-to-disc ratio; HVF humphrey visual field; GVF goldmann visual field; OCT optical coherence tomography; IOP intraocular pressure; BRVO Branch retinal vein occlusion; CRVO central retinal vein occlusion; CRAO central retinal artery  occlusion; BRAO branch retinal artery occlusion; RT retinal tear; SB scleral buckle; PPV pars plana vitrectomy; VH Vitreous hemorrhage; PRP panretinal laser photocoagulation; IVK intravitreal kenalog; VMT vitreomacular traction; MH Macular hole;  NVD neovascularization of the disc; NVE neovascularization elsewhere; AREDS age related eye disease study; ARMD age related macular degeneration; POAG primary open angle glaucoma; EBMD epithelial/anterior basement membrane dystrophy; ACIOL anterior chamber intraocular lens; IOL intraocular lens; PCIOL posterior chamber intraocular lens; Phaco/IOL phacoemulsification with intraocular lens placement; [place] photorefractive keratectomy; LASIK laser assisted in situ keratomileusis; HTN hypertension; DM diabetes mellitus; COPD chronic obstructive pulmonary disease

## 2021-05-04 NOTE — Assessment & Plan Note (Signed)
No signs of CNVM OD by OCT today

## 2021-05-07 ENCOUNTER — Ambulatory Visit: Payer: Medicare HMO | Admitting: Podiatry

## 2021-05-07 ENCOUNTER — Other Ambulatory Visit: Payer: Self-pay

## 2021-05-07 DIAGNOSIS — I83018 Varicose veins of right lower extremity with ulcer other part of lower leg: Secondary | ICD-10-CM | POA: Diagnosis not present

## 2021-05-07 DIAGNOSIS — L97811 Non-pressure chronic ulcer of other part of right lower leg limited to breakdown of skin: Secondary | ICD-10-CM

## 2021-05-07 NOTE — Progress Notes (Signed)
  Subjective:  Patient ID: Darrell Horne, male    DOB: [birth year],  MRN: [id number]  Chief Complaint  Patient presents with   Wound Check    Pt states right dorsal wound has had clear drainage. Denies fever/chills/nausea/vomiting and states he has no other concerns.   75 y.o. male presents for wound care. Hx confirmed with patient.  Objective:  Physical Exam: Venous stasis wound recurrence, no warmth no erythema no signs of acute infection noted. Superficial 2nd toe ulceration without probe to bone, mild toe edema, no erythema. Assessment:   1. Venous stasis ulcer of other part of right lower leg limited to breakdown of skin with varicose veins (HCC)    Plan:  Patient was evaluated and treated and all questions answered.  Ulcer right foot, right leg venous stasis ulcer -Leg ulcers appear healed. Dorsal foot ulcer healed. 2nd toe ulcer small but remains. -Reapply unna boot today  Procedure: Multilayer Compression dressing Rationale: venous insufficiency Technique: Unna boot, cast padding, Coban compression dressing applied Disposition: Patient tolerated procedure well.    Return in about 3 weeks (around 05/28/2021) for Wound Care.

## 2021-05-20 ENCOUNTER — Telehealth: Payer: Self-pay | Admitting: *Deleted

## 2021-05-20 ENCOUNTER — Other Ambulatory Visit: Payer: Self-pay | Admitting: *Deleted

## 2021-05-20 ENCOUNTER — Other Ambulatory Visit: Payer: Self-pay | Admitting: Family

## 2021-05-20 DIAGNOSIS — E1142 Type 2 diabetes mellitus with diabetic polyneuropathy: Secondary | ICD-10-CM

## 2021-05-20 DIAGNOSIS — I480 Paroxysmal atrial fibrillation: Secondary | ICD-10-CM

## 2021-05-20 MED ORDER — APIXABAN 5 MG PO TABS: 5.0000 mg | ORAL_TABLET | Freq: Two times a day (BID) | ORAL | 1 refills | Status: DC

## 2021-05-20 NOTE — Telephone Encounter (Signed)
[facility].

## 2021-05-20 NOTE — Telephone Encounter (Signed)
Patient wife called and stated that patient needed forms and a Rx for a Wheelchair.   I explained that we do not have forms here for a Wheelchair that all we provide is an order.   She stated that the Supply store requested forms. She stated that she is going to call them back for Clarification and then let us know what she needs.

## 2021-05-21 ENCOUNTER — Ambulatory Visit: Payer: Medicare HMO | Admitting: Podiatry

## 2021-05-21 ENCOUNTER — Other Ambulatory Visit: Payer: Self-pay

## 2021-05-21 DIAGNOSIS — I83018 Varicose veins of right lower extremity with ulcer other part of lower leg: Secondary | ICD-10-CM

## 2021-05-21 DIAGNOSIS — L97811 Non-pressure chronic ulcer of other part of right lower leg limited to breakdown of skin: Secondary | ICD-10-CM | POA: Diagnosis not present

## 2021-05-21 DIAGNOSIS — B353 Tinea pedis: Secondary | ICD-10-CM

## 2021-05-21 DIAGNOSIS — L97512 Non-pressure chronic ulcer of other part of right foot with fat layer exposed: Secondary | ICD-10-CM

## 2021-05-21 MED ORDER — KETOCONAZOLE 2 % EX CREA: TOPICAL_CREAM | CUTANEOUS | 0 refills | Status: DC

## 2021-05-21 NOTE — Progress Notes (Signed)
  Subjective:  Patient ID: Darrell Horne, male    DOB: 12/12/1945,  MRN: [id number]  Chief Complaint  Patient presents with   Wound Check    Pt states improvement, denies fever/chills/nausea/vomiting.   75 y.o. male presents for wound care. Hx confirmed with patient.  Objective:  Physical Exam: Venous stasis wound recurrence, no warmth no erythema no signs of acute infection noted. Superficial 2nd toe healed ulceration, no edema, no erythema. Assessment:   1. Venous stasis ulcer of other part of right lower leg limited to breakdown of skin with varicose veins (HCC)   2. Ulcer of right second toe with fat layer exposed ([place])   3. Tinea pedis of right foot    Plan:  Patient was evaluated and treated and all questions answered.  Ulcer right foot, right leg venous stasis ulcer -Leg remains healed. Ulcers to the toes are now healed.  Tinea pedis -Rx ketoconazole. Discussed sanitation of shoes to prevent recurrence.  Return in about 1 month (around 06/21/2021) for Athlete's foot and ulcer check.

## 2021-06-03 ENCOUNTER — Other Ambulatory Visit: Payer: Self-pay | Admitting: Family

## 2021-06-03 DIAGNOSIS — D696 Thrombocytopenia, unspecified: Secondary | ICD-10-CM

## 2021-06-03 DIAGNOSIS — B182 Chronic viral hepatitis C: Secondary | ICD-10-CM

## 2021-06-22 ENCOUNTER — Ambulatory Visit: Payer: Medicare HMO | Admitting: Podiatry

## 2021-06-22 ENCOUNTER — Other Ambulatory Visit: Payer: Self-pay

## 2021-06-22 DIAGNOSIS — B353 Tinea pedis: Secondary | ICD-10-CM

## 2021-06-22 DIAGNOSIS — L97811 Non-pressure chronic ulcer of other part of right lower leg limited to breakdown of skin: Secondary | ICD-10-CM | POA: Diagnosis not present

## 2021-06-22 DIAGNOSIS — L97512 Non-pressure chronic ulcer of other part of right foot with fat layer exposed: Secondary | ICD-10-CM | POA: Diagnosis not present

## 2021-06-22 DIAGNOSIS — I83018 Varicose veins of right lower extremity with ulcer other part of lower leg: Secondary | ICD-10-CM

## 2021-06-22 NOTE — Progress Notes (Signed)
  Subjective:  Patient ID: Darrell Horne, male    DOB: 09-11-1946,  MRN: [id number]  Chief Complaint  Patient presents with   Foot Ulcer     1 month (around 06/21/2021) for Athlete's foot and ulcer check.   75 y.o. male presents for wound care. Hx confirmed with patient. Thinks the wounds have remained healed, denies other issues. Thinks the athlete's foot is doing better. Objective:  Physical Exam: Venous stasis wound recurrence, no warmth no erythema no signs of acute infection noted. Superficial 2nd toe healed ulceration, no edema, no erythema. Assessment:   1. Venous stasis ulcer of other part of right lower leg limited to breakdown of skin with varicose veins (HCC)   2. Ulcer of right second toe with fat layer exposed ([place])   3. Tinea pedis of right foot     Plan:  Patient was evaluated and treated and all questions answered.  Ulcer right foot, right leg venous stasis ulcer -Leg remains healed. Ulcers to the toes are now healed.  As all ulcer remains healed well have patient follow-up as needed advised patient to promptly call should any ulceration occur  Tinea pedis -Continue ketoconazole almost fully resolved.  Again discussed side has new shoes to prevent recurrence  Return if symptoms worsen or fail to improve.

## 2021-06-29 ENCOUNTER — Other Ambulatory Visit: Payer: Self-pay

## 2021-06-29 ENCOUNTER — Ambulatory Visit (INDEPENDENT_AMBULATORY_CARE_PROVIDER_SITE_OTHER): Payer: Medicare HMO | Admitting: Ophthalmology

## 2021-06-29 ENCOUNTER — Encounter (INDEPENDENT_AMBULATORY_CARE_PROVIDER_SITE_OTHER): Payer: Self-pay | Admitting: Ophthalmology

## 2021-06-29 DIAGNOSIS — H353111 Nonexudative age-related macular degeneration, right eye, early dry stage: Secondary | ICD-10-CM | POA: Diagnosis not present

## 2021-06-29 DIAGNOSIS — H353221 Exudative age-related macular degeneration, left eye, with active choroidal neovascularization: Secondary | ICD-10-CM | POA: Diagnosis not present

## 2021-06-29 DIAGNOSIS — E119 Type 2 diabetes mellitus without complications: Secondary | ICD-10-CM | POA: Diagnosis not present

## 2021-06-29 MED ORDER — BEVACIZUMAB 2.5 MG/0.1ML IZ SOSY
2.5000 mg | PREFILLED_SYRINGE | INTRAVITREAL | Status: AC | PRN
  Administered 2021-06-29: 2.5 mg via INTRAVITREAL

## 2021-06-29 NOTE — Assessment & Plan Note (Signed)
Active serous component with wet AMD notable as recent as March 2022, now vastly improved on intravitreal Avastin currently at 8-week follow-up interval.  Visual acuity preserved and anatomy improving.  Repeat injection today and extend interval of examination next 8 weeks

## 2021-06-29 NOTE — Assessment & Plan Note (Signed)
No detectable diabetic retinopathy OU 

## 2021-06-29 NOTE — Assessment & Plan Note (Signed)
No signs of CNVM OD 

## 2021-06-29 NOTE — Progress Notes (Signed)
06/29/2021     CHIEF COMPLAINT Patient presents for  Chief Complaint  Patient presents with   Retina Follow Up      HISTORY OF PRESENT ILLNESS: Darrell Horne is a 75 y.o. male who presents to the clinic today for:   HPI     Retina Follow Up   Patient presents with  Wet AMD.  In both eyes.  This started 8 weeks ago.  Duration of 8 weeks.        Comments   8 week f/u OU with OCT and possible Avastin injection OS  Pt denies any visual changes since previous visit. Pt denies any new flashes or floaters. Pt denies any eye pain. Pt does c/o itching OU.  Type 2 Diabetes. Diagnosed for ~ 15 years. A1c: 6.7  Eye Meds: None      Last edited by Reather Littler, COA on 06/29/2021 11:01 AM.      Referring physician: Sandrea Hughs, NP [place],  [place] [address]  HISTORICAL INFORMATION:   Selected notes from the MEDICAL RECORD NUMBER    Lab Results  Component Value Date   HGBA1C 5.6 03/24/2021     CURRENT MEDICATIONS: No current outpatient medications on file. (Ophthalmic Drugs)   No current facility-administered medications for this visit. (Ophthalmic Drugs)   Current Outpatient Medications (Other)  Medication Sig   amitriptyline (ELAVIL) 50 MG tablet TAKE 1 TABLET BY MOUTH EVERYDAY AT BEDTIME   apixaban (ELIQUIS) 5 MG TABS tablet Take 1 tablet (5 mg total) by mouth 2 (two) times daily.   atorvastatin (LIPITOR) 40 MG tablet Take 1 tablet (40 mg total) by mouth daily.   b complex vitamins tablet Take 1 tablet by mouth daily.   folic acid (FOLVITE) 1 MG tablet TAKE 1 TABLET BY MOUTH EVERY DAY   gabapentin (NEURONTIN) 600 MG tablet Take 1 tablet (600 mg total) by mouth 3 (three) times daily.   ketoconazole (NIZORAL) 2 % cream Apply 1 fingertip amount to each foot daily.   Menthol 5 % PTCH Apply 1 patch topically as needed (back pain).   metFORMIN (GLUCOPHAGE-XR) 500 MG 24 hr tablet Take two Tablets by mouth daily with supper   metoprolol succinate  (TOPROL-XL) 50 MG 24 hr tablet TAKE 1 TABLET BY MOUTH DAILY WITH SUPPER. TAKE WITH OR IMMEDIATELY FOLLOWING A MEAL.   omeprazole (PRILOSEC) 20 MG capsule TAKE 1 CAPSULE BY MOUTH EVERY DAY   ONETOUCH VERIO test strip USE TO TEST BLOOD SUGAR TWICE DAILY DX: E11.42   ramipril (ALTACE) 1.25 MG capsule TAKE 1 CAPSULE BY MOUTH EVERY DAY   SHINGRIX injection    topiramate (TOPAMAX) 50 MG tablet TAKE 1 AND 1/2 TABLETS BY MOUTH DAILY   vitamin B-12 (CYANOCOBALAMIN) 1000 MCG tablet Take 1,000 mcg by mouth daily.   vitamin C (ASCORBIC ACID) 500 MG tablet Take 500 mg by mouth daily.    No current facility-administered medications for this visit. (Other)      REVIEW OF SYSTEMS:    ALLERGIES No Known Allergies  PAST MEDICAL HISTORY Past Medical History:  Diagnosis Date   Chest pain    Diabetes mellitus    DM2 (diabetes mellitus, type 2) (HCC)    GERD (gastroesophageal reflux disease)    HLD (hyperlipidemia)    HTN (hypertension)    PAF (paroxysmal atrial fibrillation) (HCC)    Palpitations    Past Surgical History:  Procedure Laterality Date   APPENDECTOMY     CHOLECYSTECTOMY  COLONOSCOPY     Dr. Michail Sermon   COLONOSCOPY WITH PROPOFOL N/A 11/06/2018   Procedure: COLONOSCOPY WITH PROPOFOL;  Surgeon: Wilford Corner, MD;  Location: [facility];  Service: Endoscopy;  Laterality: N/A;   HERNIA REPAIR     LEG AMPUTATION Left [id number]   Dr. Kendell Bane   POLYPECTOMY  11/06/2018   Procedure: POLYPECTOMY;  Surgeon: Wilford Corner, MD;  Location: [facility];  Service: Endoscopy;;   SHOULDER SURGERY  [id number]   Dr. Meridee Score   TOTAL ANKLE ARTHROPLASTY  [id number]    FAMILY HISTORY Family History  Problem Relation Age of Onset   Heart disease Mother        Living   Stroke Father 64       Deceased   Diabetes Sister    Cancer Sister     SOCIAL HISTORY Social History   Tobacco Use   Smoking status: Former    Years: 12.00    Types: Cigarettes    Quit date: 10/10/1981     Years since quitting: 39.7   Smokeless tobacco: Never  Vaping Use   Vaping Use: Never used  Substance Use Topics   Alcohol use: Yes    Comment: has glass of wine once a week but states rarely    Drug use: No         OPHTHALMIC EXAM:  Base Eye Exam     Visual Acuity (Jaeger Card)       Right Left   Near cc J2 J2    Correction: Glasses         Tonometry (Tonopen, 11:06 AM)       Right Left   Pressure 20 19         Pupils       Dark Light Shape React APD   Right 5 4 Round Brisk None   Left 4 3 Round Brisk None         Visual Fields (Counting fingers)       Left Right    Full Full         Extraocular Movement       Right Left    Full, Ortho Full, Ortho         Neuro/Psych     Oriented x3: Yes   Mood/Affect: Normal         Dilation     Both eyes: 1.0% Mydriacyl, 2.5% Phenylephrine @ 11:06 AM           Slit Lamp and Fundus Exam     External Exam       Right Left   External Normal Normal         Slit Lamp Exam       Right Left   Lids/Lashes Normal Normal   Conjunctiva/Sclera White and quiet White and quiet   Cornea Clear Clear   Anterior Chamber Deep and quiet Deep and quiet   Iris Round and reactive Round and reactive   Lens Centered posterior chamber intraocular lens Centered posterior chamber intraocular lens   Anterior Vitreous Normal Normal         Fundus Exam       Right Left   Posterior Vitreous Posterior vitreous detachment Posterior vitreous detachment   Disc Normal Normal   C/D Ratio 0.65 0.65   Macula Early age related macular degeneration, Hard drusen, no membrane, no hemorrhage, Geographic atrophy Intermediate age related macular degeneration, Geographic atrophy, Hard drusen, no macular thickening, no hemorrhage   Vessels Normal, no  DR Normal, no DR   Periphery Normal Normal            IMAGING AND PROCEDURES  Imaging and Procedures for 06/29/21  OCT, Retina - OU - Both Eyes       Right  Eye Quality was good. Scan locations included subfoveal. Central Foveal Thickness: 210. Progression has been stable. Findings include no SRF, no IRF, abnormal foveal contour, retinal drusen .   Left Eye Quality was good. Scan locations included subfoveal. Central Foveal Thickness: 283. Progression has improved. Findings include pigment epithelial detachment, subretinal scarring, abnormal foveal contour, no SRF.   Notes OD with no signs of CNVM.   OS much improved, resolution of serous retinal detachment as compared to last visit some 8 weeks previous.  We will repeat injection today follow-up in 10 weeks     Intravitreal Injection, Pharmacologic Agent - OS - Left Eye       Time Out 06/29/2021. 11:43 AM. Confirmed correct patient, procedure, site, and patient consented.   Anesthesia Topical anesthesia was used. Anesthetic medications included Akten 3.5%.   Procedure Preparation included Ofloxacin , Tobramycin 0.3%, 10% betadine to eyelids, 5% betadine to ocular surface. A 30 gauge needle was used.   Injection: 2.5 mg bevacizumab 2.5 MG/0.1ML   Route: Intravitreal, Site: Left Eye   NDC: [phone], Lot: [id number]   Post-op Post injection exam found visual acuity of at least counting fingers. The patient tolerated the procedure well. There were no complications. The patient received written and verbal post procedure care education. Post injection medications included ocuflox.              ASSESSMENT/PLAN:  Exudative age-related macular degeneration of left eye with active choroidal neovascularization (HCC) Active serous component with wet AMD notable as recent as March 2022, now vastly improved on intravitreal Avastin currently at 8-week follow-up interval.  Visual acuity preserved and anatomy improving.  Repeat injection today and extend interval of examination next 8 weeks  Early stage nonexudative age-related macular degeneration of right eye No signs of CNVM  OD  Diabetes mellitus without complication (HCC) No detectable diabetic retinopathy OU     ICD-10-CM   1. Exudative age-related macular degeneration of left eye with active choroidal neovascularization (HCC)  H35.3221 OCT, Retina - OU - Both Eyes    Intravitreal Injection, Pharmacologic Agent - OS - Left Eye    bevacizumab (AVASTIN) SOSY 2.5 mg    2. Early stage nonexudative age-related macular degeneration of right eye  H35.3111     3. Diabetes mellitus without complication ([place])  W41.3       1.  OD no sign of CNVM today  2.  OS improved overall wet AMD currently at 8-week follow-up interval in stable condition.  Good acuity.  Repeat injection Avastin OS today to maintain and extend interval examination OS to 10 weeks  3.  Ophthalmic Meds Ordered this visit:  Meds ordered this encounter  Medications   bevacizumab (AVASTIN) SOSY 2.5 mg       Return in about 10 weeks (around 09/07/2021) for dilate, OS, AVASTIN OCT.  There are no Patient Instructions on file for this visit.   Explained the diagnoses, plan, and follow up with the patient and they expressed understanding.  Patient expressed understanding of the importance of proper follow up care.   Clent Demark Quindarius Cabello M.D. Diseases & Surgery of the Retina and Vitreous Retina & Diabetic [place] 06/29/21     Abbreviations: M myopia (nearsighted); A astigmatism; H  hyperopia (farsighted); P presbyopia; Mrx spectacle prescription;  CTL contact lenses; OD right eye; OS left eye; OU both eyes  XT exotropia; ET esotropia; PEK punctate epithelial keratitis; PEE punctate epithelial erosions; DES dry eye syndrome; MGD meibomian gland dysfunction; ATs artificial tears; PFAT's preservative free artificial tears; [place] nuclear sclerotic cataract; PSC posterior subcapsular cataract; ERM epi-retinal membrane; PVD posterior vitreous detachment; RD retinal detachment; DM diabetes mellitus; DR diabetic retinopathy; NPDR non-proliferative diabetic  retinopathy; PDR proliferative diabetic retinopathy; CSME clinically significant macular edema; DME diabetic macular edema; dbh dot blot hemorrhages; CWS cotton wool spot; POAG primary open angle glaucoma; C/D cup-to-disc ratio; HVF humphrey visual field; GVF goldmann visual field; OCT optical coherence tomography; IOP intraocular pressure; BRVO Branch retinal vein occlusion; CRVO central retinal vein occlusion; CRAO central retinal artery occlusion; BRAO branch retinal artery occlusion; RT retinal tear; SB scleral buckle; PPV pars plana vitrectomy; VH Vitreous hemorrhage; PRP panretinal laser photocoagulation; IVK intravitreal kenalog; VMT vitreomacular traction; MH Macular hole;  NVD neovascularization of the disc; NVE neovascularization elsewhere; AREDS age related eye disease study; ARMD age related macular degeneration; POAG primary open angle glaucoma; EBMD epithelial/anterior basement membrane dystrophy; ACIOL anterior chamber intraocular lens; IOL intraocular lens; PCIOL posterior chamber intraocular lens; Phaco/IOL phacoemulsification with intraocular lens placement; [place] photorefractive keratectomy; LASIK laser assisted in situ keratomileusis; HTN hypertension; DM diabetes mellitus; COPD chronic obstructive pulmonary disease

## 2021-07-20 ENCOUNTER — Telehealth: Payer: Self-pay | Admitting: Family

## 2021-07-20 NOTE — Telephone Encounter (Signed)
Pt has appt 10/12 to request urology referral Darrell Horne

## 2021-07-21 ENCOUNTER — Other Ambulatory Visit: Payer: Self-pay

## 2021-07-21 ENCOUNTER — Telehealth (INDEPENDENT_AMBULATORY_CARE_PROVIDER_SITE_OTHER): Payer: Medicare HMO | Admitting: Family

## 2021-07-21 DIAGNOSIS — R351 Nocturia: Secondary | ICD-10-CM

## 2021-07-21 NOTE — Patient Instructions (Signed)
Referral ordered for urologist as requested specialist office will call you.

## 2021-07-21 NOTE — Progress Notes (Signed)
This service is provided via telemedicine  No vital signs collected/recorded due to the encounter was a telemedicine visit.   Location of patient (ex: home, work):  Home  Patient consents to a telephone visit:  Yes, see encounter dated 12/14/2020  Location of the provider (ex: office, home):  Home  Name of any referring provider:  N/A  Names of all persons participating in the telemedicine service and their role in the encounter:  Marlowe Sax, NP, Carroll Kinds, CMA and patient  Time spent on call:  6 minutes with medical assistant   Provider: Dalisa Forrer FNP-C  Catelin Manthe, Nelda Bucks, NP  Patient Care Team: Rie Mcneil, Nelda Bucks, NP as PCP - General (Family Medicine) Clent Jacks, MD as Consulting Physician (Ophthalmology) Alda Berthold, DO as Consulting Physician (Neurology)  Extended Emergency Contact Information Primary Emergency Contact: Cabler,Anna Address: [address]          [place], [place] [address] [place] Mobile Phone: [phone] Relation: Spouse Secondary Emergency Contact: Thomasenia Bottoms [place] Phone: [phone] Relation: Sister  Code Status:  Full Code  Goals of care: Advanced Directive information Advanced Directives 03/24/2021  Does Patient Have a Medical Advance Directive? Yes  Type of Paramedic of [place];Living will  Does patient want to make changes to medical advance directive? No - Patient declined  Copy of [place] in Chart? Yes - validated most recent copy scanned in chart (See row information)  Would patient like information on creating a medical advance directive? -     Chief Complaint  Patient presents with   Acute Visit    Patient would like referral to urologist. Patient  is not having urinary symptoms. Patient just wants to have a prostate exam.    HPI:  Pt is a 75 y.o. male seen today for an acute visit for referral to Urologist.He states has no  symptoms but sometimes gets up to 3 times to void and at times does not.He denies any dysuria,urgency,or slow stream.states no history of prostate cancer.Also denies any abdominal /back pain, fever or chills He states would like prostate checked.recommended to come to office for prostate to be checked but would rather see a Urologist.  Blood sugars are well controlled.   Past Medical History:  Diagnosis Date   Chest pain    Diabetes mellitus    DM2 (diabetes mellitus, type 2) (HCC)    GERD (gastroesophageal reflux disease)    HLD (hyperlipidemia)    HTN (hypertension)    PAF (paroxysmal atrial fibrillation) (HCC)    Palpitations    Past Surgical History:  Procedure Laterality Date   APPENDECTOMY     CHOLECYSTECTOMY     COLONOSCOPY     Dr. Michail Sermon   COLONOSCOPY WITH PROPOFOL N/A 11/06/2018   Procedure: COLONOSCOPY WITH PROPOFOL;  Surgeon: Wilford Corner, MD;  Location: [facility];  Service: Endoscopy;  Laterality: N/A;   HERNIA REPAIR     LEG AMPUTATION Left [id number]   Dr. Kendell Bane   POLYPECTOMY  11/06/2018   Procedure: POLYPECTOMY;  Surgeon: Wilford Corner, MD;  Location: [facility];  Service: Endoscopy;;   SHOULDER SURGERY  [id number]   Dr. Meridee Score   TOTAL ANKLE ARTHROPLASTY  [id number]    No Known Allergies  Outpatient Encounter Medications as of 07/21/2021  Medication Sig   amitriptyline (ELAVIL) 50 MG tablet TAKE 1 TABLET BY MOUTH EVERYDAY AT BEDTIME   apixaban (ELIQUIS) 5 MG TABS tablet Take 1 tablet (  5 mg total) by mouth 2 (two) times daily.   atorvastatin (LIPITOR) 40 MG tablet Take 1 tablet (40 mg total) by mouth daily.   b complex vitamins tablet Take 1 tablet by mouth daily.   folic acid (FOLVITE) 1 MG tablet TAKE 1 TABLET BY MOUTH EVERY DAY   gabapentin (NEURONTIN) 600 MG tablet Take 1 tablet (600 mg total) by mouth 3 (three) times daily.   ketoconazole (NIZORAL) 2 % cream Apply 1 fingertip amount to each foot daily.   Menthol 5 % PTCH Apply 1  patch topically as needed (back pain).   metFORMIN (GLUCOPHAGE-XR) 500 MG 24 hr tablet Take two Tablets by mouth daily with supper   metoprolol succinate (TOPROL-XL) 50 MG 24 hr tablet TAKE 1 TABLET BY MOUTH DAILY WITH SUPPER. TAKE WITH OR IMMEDIATELY FOLLOWING A MEAL.   omeprazole (PRILOSEC) 20 MG capsule TAKE 1 CAPSULE BY MOUTH EVERY DAY   ONETOUCH VERIO test strip USE TO TEST BLOOD SUGAR TWICE DAILY DX: E11.42   ramipril (ALTACE) 1.25 MG capsule TAKE 1 CAPSULE BY MOUTH EVERY DAY   SHINGRIX injection    topiramate (TOPAMAX) 50 MG tablet TAKE 1 AND 1/2 TABLETS BY MOUTH DAILY   vitamin B-12 (CYANOCOBALAMIN) 1000 MCG tablet Take 1,000 mcg by mouth daily.   vitamin C (ASCORBIC ACID) 500 MG tablet Take 500 mg by mouth daily.    No facility-administered encounter medications on file as of 07/21/2021.    Review of Systems  Constitutional:  Negative for appetite change, chills, fatigue, fever and unexpected weight change.  Respiratory:  Negative for cough, chest tightness, shortness of breath and wheezing.   Cardiovascular:  Negative for chest pain, palpitations and leg swelling.  Gastrointestinal:  Negative for abdominal distention, abdominal pain, constipation, diarrhea, nausea and vomiting.  Endocrine: Negative for polydipsia, polyphagia and polyuria.  Genitourinary:  Positive for frequency. Negative for difficulty urinating, dysuria, flank pain, hematuria, scrotal swelling and urgency.       Urine frequency at night sometimes up to three times.   Musculoskeletal:  Negative for arthralgias, back pain, gait problem, joint swelling and myalgias.  Skin:  Negative for color change, pallor and rash.  Neurological:  Negative for dizziness, syncope, speech difficulty, weakness, light-headedness, numbness and headaches.   Immunization History  Administered Date(s) Administered   DTaP 10/10/2014   Fluad Quad(high Dose 65+) 08/28/2019, 08/07/2020   Influenza,inj,Quad PF,6+ Mos 08/05/2016    Influenza-Unspecified 10/10/2014   PFIZER(Purple Top)SARS-COV-2 Vaccination 01/28/2020, 02/18/2020   Pneumococcal Conjugate-13 11/11/2016   Pneumococcal-Unspecified 10/10/2014   Tdap 12/01/2020   Zoster Recombinat (Shingrix) 03/09/2021   Pertinent  Health Maintenance Due  Topic Date Due   INFLUENZA VACCINE  05/10/2021   FOOT EXAM  05/19/2021   HEMOGLOBIN A1C  09/23/2021   OPHTHALMOLOGY EXAM  06/29/2022   COLONOSCOPY (Pts 45-56yrs Insurance coverage will need to be confirmed)  11/06/2028   Fall Risk  03/24/2021 12/14/2020 11/26/2020 10/08/2019 08/28/2019  Falls in the past year? 0 0 0 0 1  Number falls in past yr: 0 0 0 - 0  Injury with Fall? 0 0 0 - 0  Comment - - - - -  Risk Factor Category  - - - - -  Risk for fall due to : - - - Impaired balance/gait;Impaired mobility -  Follow up - - - Falls evaluation completed;Education provided;Falls prevention discussed -   Functional Status Survey:    There were no vitals filed for this visit. There is no height or weight on  file to calculate BMI. Physical Exam  Unable to complete on Telephone visit.  Labs reviewed: Recent Labs    08/07/20 1037 03/24/21 1052  NA 140 135  K 3.9 4.1  CL 107 102  CO2 28 25  GLUCOSE 92 110*  BUN 5* 5*  CREATININE 0.48* 0.66*  CALCIUM 8.4* 8.6   Recent Labs    08/07/20 1037 03/24/21 1052  AST 20 20  ALT 11 13  BILITOT 0.9 1.6*  PROT 5.7* 6.6   Recent Labs    08/07/20 1037 03/24/21 1052  WBC 4.8 5.1  NEUTROABS 2,995 3,565  HGB 12.3* 13.9  HCT 36.0* 42.3  MCV 99.7 97.5  PLT 71* 87*   Lab Results  Component Value Date   TSH 1.39 03/24/2021   Lab Results  Component Value Date   HGBA1C 5.6 03/24/2021   Lab Results  Component Value Date   CHOL 158 03/24/2021   HDL 72 03/24/2021   LDLCALC 70 03/24/2021   TRIG 81 03/24/2021   CHOLHDL 2.2 03/24/2021    Significant Diagnostic Results in last 30 days:  Intravitreal Injection, Pharmacologic Agent - OS - Left Eye  Result Date:  06/29/2021 Time Out 06/29/2021. 11:43 AM. Confirmed correct patient, procedure, site, and patient consented. Anesthesia Topical anesthesia was used. Anesthetic medications included Akten 3.5%. Procedure Preparation included Ofloxacin , Tobramycin 0.3%, 10% betadine to eyelids, 5% betadine to ocular surface. A 30 gauge needle was used. Injection: 2.5 mg bevacizumab 2.5 MG/0.1ML   Route: Intravitreal, Site: Left Eye   NDC: [phone], Lot: [id number] Post-op Post injection exam found visual acuity of at least counting fingers. The patient tolerated the procedure well. There were no complications. The patient received written and verbal post procedure care education. Post injection medications included ocuflox.   OCT, Retina - OU - Both Eyes  Result Date: 06/29/2021 Right Eye Quality was good. Scan locations included subfoveal. Central Foveal Thickness: 210. Progression has been stable. Findings include no SRF, no IRF, abnormal foveal contour, retinal drusen . Left Eye Quality was good. Scan locations included subfoveal. Central Foveal Thickness: 283. Progression has improved. Findings include pigment epithelial detachment, subretinal scarring, abnormal foveal contour, no SRF. Notes OD with no signs of CNVM. OS much improved, resolution of serous retinal detachment as compared to last visit some 8 weeks previous.  We will repeat injection today follow-up in 10 weeks   Assessment/Plan  Nocturia more than twice per night Reports up to 3 times voiding sometimes and at times not. Denies any urine urgency,dysuria,Hematuria or slow urine stream. Declined prostate evaluation at the office or to rule out UTI.would like referral to Urologist. - Ambulatory referral to Urology  Family/ staff Communication: Reviewed plan of care with patient verbalized understanding.   Labs/tests ordered: None   Next Appointment: advised to schedule routine visit around 09/23/2021   I connected with  Darrell Horne on 07/21/21 by  Telephone enabled telemedicine application and verified that I am speaking with the correct person using two identifiers.   I discussed the limitations of evaluation and management by telemedicine. The patient expressed understanding and agreed to proceed.  Spent 12 minutes of non-face to face with patient  >50% time spent counseling; reviewing medical record; tests; labs; and developing future plan of care   Sandrea Hughs, NP

## 2021-08-06 ENCOUNTER — Encounter: Payer: Self-pay | Admitting: *Deleted

## 2021-09-07 ENCOUNTER — Other Ambulatory Visit: Payer: Self-pay

## 2021-09-07 ENCOUNTER — Ambulatory Visit (INDEPENDENT_AMBULATORY_CARE_PROVIDER_SITE_OTHER): Payer: Medicare HMO | Admitting: Ophthalmology

## 2021-09-07 ENCOUNTER — Encounter (INDEPENDENT_AMBULATORY_CARE_PROVIDER_SITE_OTHER): Payer: Self-pay | Admitting: Ophthalmology

## 2021-09-07 DIAGNOSIS — E119 Type 2 diabetes mellitus without complications: Secondary | ICD-10-CM | POA: Diagnosis not present

## 2021-09-07 DIAGNOSIS — H353221 Exudative age-related macular degeneration, left eye, with active choroidal neovascularization: Secondary | ICD-10-CM

## 2021-09-07 DIAGNOSIS — H353111 Nonexudative age-related macular degeneration, right eye, early dry stage: Secondary | ICD-10-CM | POA: Diagnosis not present

## 2021-09-07 MED ORDER — BEVACIZUMAB 2.5 MG/0.1ML IZ SOSY
2.5000 mg | PREFILLED_SYRINGE | INTRAVITREAL | Status: AC | PRN
  Administered 2021-09-07: 2.5 mg via INTRAVITREAL

## 2021-09-07 NOTE — Assessment & Plan Note (Signed)
No signs of CNVM OD 

## 2021-09-07 NOTE — Progress Notes (Signed)
09/07/2021     CHIEF COMPLAINT Patient presents for  Chief Complaint  Patient presents with   Retina Follow Up      HISTORY OF PRESENT ILLNESS: Darrell Horne is a 75 y.o. male who presents to the clinic today for:   HPI     Retina Follow Up   Patient presents with  Wet AMD.  In left eye.  This started 10 weeks ago.  Severity is mild.  Duration of 10 weeks.  Since onset it is stable.        Comments   10 week fu os, OCT and Avastin OS Pt states VA OU stable since last visit. Pt denies FOL, floaters, or ocular pain OU.  Pt states, "everything still seems good to me. No problems." A1C:7.0 LBS: Does not check at home      Last edited by Kendra Opitz, COA on 09/07/2021 10:40 AM.      Referring physician: Sandrea Hughs, NP [place],  [place] [id number]  HISTORICAL INFORMATION:   Selected notes from the MEDICAL RECORD NUMBER    Lab Results  Component Value Date   HGBA1C 5.6 03/24/2021     CURRENT MEDICATIONS: No current outpatient medications on file. (Ophthalmic Drugs)   No current facility-administered medications for this visit. (Ophthalmic Drugs)   Current Outpatient Medications (Other)  Medication Sig   amitriptyline (ELAVIL) 50 MG tablet TAKE 1 TABLET BY MOUTH EVERYDAY AT BEDTIME   apixaban (ELIQUIS) 5 MG TABS tablet Take 1 tablet (5 mg total) by mouth 2 (two) times daily.   atorvastatin (LIPITOR) 40 MG tablet Take 1 tablet (40 mg total) by mouth daily.   b complex vitamins tablet Take 1 tablet by mouth daily.   folic acid (FOLVITE) 1 MG tablet TAKE 1 TABLET BY MOUTH EVERY DAY   gabapentin (NEURONTIN) 600 MG tablet Take 1 tablet (600 mg total) by mouth 3 (three) times daily.   ketoconazole (NIZORAL) 2 % cream Apply 1 fingertip amount to each foot daily.   Menthol 5 % PTCH Apply 1 patch topically as needed (back pain).   metFORMIN (GLUCOPHAGE-XR) 500 MG 24 hr tablet Take two Tablets by mouth daily with supper   metoprolol succinate  (TOPROL-XL) 50 MG 24 hr tablet TAKE 1 TABLET BY MOUTH DAILY WITH SUPPER. TAKE WITH OR IMMEDIATELY FOLLOWING A MEAL.   omeprazole (PRILOSEC) 20 MG capsule TAKE 1 CAPSULE BY MOUTH EVERY DAY   ONETOUCH VERIO test strip USE TO TEST BLOOD SUGAR TWICE DAILY DX: E11.42   ramipril (ALTACE) 1.25 MG capsule TAKE 1 CAPSULE BY MOUTH EVERY DAY   SHINGRIX injection    topiramate (TOPAMAX) 50 MG tablet TAKE 1 AND 1/2 TABLETS BY MOUTH DAILY   vitamin B-12 (CYANOCOBALAMIN) 1000 MCG tablet Take 1,000 mcg by mouth daily.   vitamin C (ASCORBIC ACID) 500 MG tablet Take 500 mg by mouth daily.    No current facility-administered medications for this visit. (Other)      REVIEW OF SYSTEMS:    ALLERGIES No Known Allergies  PAST MEDICAL HISTORY Past Medical History:  Diagnosis Date   Chest pain    Diabetes mellitus    DM2 (diabetes mellitus, type 2) (HCC)    GERD (gastroesophageal reflux disease)    HLD (hyperlipidemia)    HTN (hypertension)    PAF (paroxysmal atrial fibrillation) (HCC)    Palpitations    Past Surgical History:  Procedure Laterality Date   APPENDECTOMY     CHOLECYSTECTOMY  COLONOSCOPY     Dr. Michail Sermon   COLONOSCOPY WITH PROPOFOL N/A 11/06/2018   Procedure: COLONOSCOPY WITH PROPOFOL;  Surgeon: Wilford Corner, MD;  Location: [facility];  Service: Endoscopy;  Laterality: N/A;   HERNIA REPAIR     LEG AMPUTATION Left [id number]   Dr. Kendell Bane   POLYPECTOMY  11/06/2018   Procedure: POLYPECTOMY;  Surgeon: Wilford Corner, MD;  Location: [facility];  Service: Endoscopy;;   SHOULDER SURGERY  [id number]   Dr. Meridee Score   TOTAL ANKLE ARTHROPLASTY  [id number]    FAMILY HISTORY Family History  Problem Relation Age of Onset   Heart disease Mother        Living   Stroke Father 43       Deceased   Diabetes Sister    Cancer Sister     SOCIAL HISTORY Social History   Tobacco Use   Smoking status: Former    Years: 12.00    Types: Cigarettes    Quit date: 10/10/1981     Years since quitting: 39.9   Smokeless tobacco: Never  Vaping Use   Vaping Use: Never used  Substance Use Topics   Alcohol use: Yes    Comment: has glass of wine once a week but states rarely    Drug use: No         OPHTHALMIC EXAM:  Base Eye Exam     Visual Acuity (ETDRS)       Right Left   Dist [place] 20/40 -1 20/40 +2   Dist ph [place] 20/30 NI    Correction: Glasses         Tonometry (Tonopen, 10:45 AM)       Right Left   Pressure 10 10         Pupils       Pupils Dark Light Shape React APD   Right PERRL 5 3 Round Brisk None   Left PERRL 4 3 Round Brisk None         Visual Fields (Counting fingers)       Left Right    Full Full         Extraocular Movement       Right Left    Full, Ortho Full, Ortho         Neuro/Psych     Oriented x3: Yes   Mood/Affect: Normal         Dilation     Left eye: 1.0% Mydriacyl, 2.5% Phenylephrine @ 10:45 AM           Slit Lamp and Fundus Exam     External Exam       Right Left   External Normal Normal         Slit Lamp Exam       Right Left   Lids/Lashes Normal Normal   Conjunctiva/Sclera White and quiet White and quiet   Cornea Clear Clear   Anterior Chamber Deep and quiet Deep and quiet   Iris Round and reactive Round and reactive   Lens Centered posterior chamber intraocular lens Centered posterior chamber intraocular lens   Anterior Vitreous Normal Normal         Fundus Exam       Right Left   Posterior Vitreous  Posterior vitreous detachment   Disc  Normal   C/D Ratio  0.65   Macula  Intermediate age related macular degeneration, Geographic atrophy, Hard drusen, no macular thickening, no hemorrhage   Vessels  Normal, no DR  Periphery  Normal            IMAGING AND PROCEDURES  Imaging and Procedures for 09/07/21           ASSESSMENT/PLAN:  Exudative age-related macular degeneration of left eye with active choroidal neovascularization ([place]) Onset of therapy  March 2022  Diabetes mellitus without complication ([place]) No detectable diabetic retinopathy  Early stage nonexudative age-related macular degeneration of right eye No signs of CNVM OD     ICD-10-CM   1. Exudative age-related macular degeneration of left eye with active choroidal neovascularization (HCC)  H35.3221 OCT, Retina - OU - Both Eyes    Intravitreal Injection, Pharmacologic Agent - OS - Left Eye    2. Diabetes mellitus without complication ([place])  N16.5     3. Early stage nonexudative age-related macular degeneration of right eye  H35.3111       1.  OS macular anatomy and acuity vastly improved as compared to onset of wet AMD, and treatment, March 2022.  Currently at 10-week interval small region of fluid did recur superiorly at a vascularized PED.  We will repeat injection Avastin today and follow-up in 9 weeks  2.  3.  Ophthalmic Meds Ordered this visit:  No orders of the defined types were placed in this encounter.      Return in about 9 weeks (around 11/09/2021) for dilate, OS, AVASTIN OCT.  There are no Patient Instructions on file for this visit.   Explained the diagnoses, plan, and follow up with the patient and they expressed understanding.  Patient expressed understanding of the importance of proper follow up care.   Clent Demark Brooklen Runquist M.D. Diseases & Surgery of the Retina and Vitreous Retina & Diabetic [place] 09/07/21     Abbreviations: M myopia (nearsighted); A astigmatism; H hyperopia (farsighted); P presbyopia; Mrx spectacle prescription;  CTL contact lenses; OD right eye; OS left eye; OU both eyes  XT exotropia; ET esotropia; PEK punctate epithelial keratitis; PEE punctate epithelial erosions; DES dry eye syndrome; MGD meibomian gland dysfunction; ATs artificial tears; PFAT's preservative free artificial tears; [place] nuclear sclerotic cataract; PSC posterior subcapsular cataract; ERM epi-retinal membrane; PVD posterior vitreous detachment; RD retinal  detachment; DM diabetes mellitus; DR diabetic retinopathy; NPDR non-proliferative diabetic retinopathy; PDR proliferative diabetic retinopathy; CSME clinically significant macular edema; DME diabetic macular edema; dbh dot blot hemorrhages; CWS cotton wool spot; POAG primary open angle glaucoma; C/D cup-to-disc ratio; HVF humphrey visual field; GVF goldmann visual field; OCT optical coherence tomography; IOP intraocular pressure; BRVO Branch retinal vein occlusion; CRVO central retinal vein occlusion; CRAO central retinal artery occlusion; BRAO branch retinal artery occlusion; RT retinal tear; SB scleral buckle; PPV pars plana vitrectomy; VH Vitreous hemorrhage; PRP panretinal laser photocoagulation; IVK intravitreal kenalog; VMT vitreomacular traction; MH Macular hole;  NVD neovascularization of the disc; NVE neovascularization elsewhere; AREDS age related eye disease study; ARMD age related macular degeneration; POAG primary open angle glaucoma; EBMD epithelial/anterior basement membrane dystrophy; ACIOL anterior chamber intraocular lens; IOL intraocular lens; PCIOL posterior chamber intraocular lens; Phaco/IOL phacoemulsification with intraocular lens placement; [place] photorefractive keratectomy; LASIK laser assisted in situ keratomileusis; HTN hypertension; DM diabetes mellitus; COPD chronic obstructive pulmonary disease

## 2021-09-07 NOTE — Assessment & Plan Note (Signed)
Onset of therapy March 2022

## 2021-09-07 NOTE — Assessment & Plan Note (Signed)
No detectable diabetic retinopathy 

## 2021-09-28 ENCOUNTER — Ambulatory Visit: Payer: Medicare HMO | Admitting: Family

## 2021-11-09 ENCOUNTER — Encounter (INDEPENDENT_AMBULATORY_CARE_PROVIDER_SITE_OTHER): Payer: Medicare HMO | Admitting: Ophthalmology

## 2021-11-18 ENCOUNTER — Other Ambulatory Visit: Payer: Self-pay | Admitting: Family

## 2021-11-18 DIAGNOSIS — E1142 Type 2 diabetes mellitus with diabetic polyneuropathy: Secondary | ICD-10-CM

## 2021-11-22 ENCOUNTER — Encounter (INDEPENDENT_AMBULATORY_CARE_PROVIDER_SITE_OTHER): Payer: Medicare Other | Admitting: Ophthalmology

## 2021-11-24 ENCOUNTER — Ambulatory Visit (INDEPENDENT_AMBULATORY_CARE_PROVIDER_SITE_OTHER): Payer: Medicare Other | Admitting: Ophthalmology

## 2021-11-24 ENCOUNTER — Other Ambulatory Visit: Payer: Self-pay

## 2021-11-24 ENCOUNTER — Encounter (INDEPENDENT_AMBULATORY_CARE_PROVIDER_SITE_OTHER): Payer: Self-pay | Admitting: Ophthalmology

## 2021-11-24 DIAGNOSIS — E119 Type 2 diabetes mellitus without complications: Secondary | ICD-10-CM | POA: Diagnosis not present

## 2021-11-24 DIAGNOSIS — H353221 Exudative age-related macular degeneration, left eye, with active choroidal neovascularization: Secondary | ICD-10-CM

## 2021-11-24 MED ORDER — BEVACIZUMAB 2.5 MG/0.1ML IZ SOSY
2.5000 mg | PREFILLED_SYRINGE | INTRAVITREAL | Status: AC | PRN
  Administered 2021-11-24: 2.5 mg via INTRAVITREAL

## 2021-11-24 NOTE — Assessment & Plan Note (Signed)
OS with still less activity at 11-week interval with less subretinal fluid over vascularized PED.  We will repeat injection today to maintain and follow-up again in 3 months.  Visual acuity also stable.

## 2021-11-24 NOTE — Assessment & Plan Note (Signed)
No detectable diabetic retinopathy today 

## 2021-11-24 NOTE — Progress Notes (Signed)
11/24/2021     CHIEF COMPLAINT Patient presents for  Chief Complaint  Patient presents with   Macular Degeneration      HISTORY OF PRESENT ILLNESS: Darrell Horne is a 76 y.o. male who presents to the clinic today for:   HPI   9 weeks dilate OS planned interval follow-up, 11 weeks actual follow-up interval, Avastin OCT. Patient states vision is stable and unchanged since last visit. Denies any new floaters or FOL. Pt states "eyes itch." Last edited by Hurman Horn, MD on 11/24/2021 11:00 AM.      Referring physician: Sandrea Hughs, NP [place],  [place] [address]  HISTORICAL INFORMATION:   Selected notes from the MEDICAL RECORD NUMBER    Lab Results  Component Value Date   HGBA1C 5.6 03/24/2021     CURRENT MEDICATIONS: No current outpatient medications on file. (Ophthalmic Drugs)   No current facility-administered medications for this visit. (Ophthalmic Drugs)   Current Outpatient Medications (Other)  Medication Sig   amitriptyline (ELAVIL) 50 MG tablet TAKE 1 TABLET BY MOUTH EVERYDAY AT BEDTIME   apixaban (ELIQUIS) 5 MG TABS tablet Take 1 tablet (5 mg total) by mouth 2 (two) times daily.   atorvastatin (LIPITOR) 40 MG tablet Take 1 tablet (40 mg total) by mouth daily.   b complex vitamins tablet Take 1 tablet by mouth daily.   folic acid (FOLVITE) 1 MG tablet TAKE 1 TABLET BY MOUTH EVERY DAY   gabapentin (NEURONTIN) 600 MG tablet Take 1 tablet (600 mg total) by mouth 3 (three) times daily.   ketoconazole (NIZORAL) 2 % cream Apply 1 fingertip amount to each foot daily.   Menthol 5 % PTCH Apply 1 patch topically as needed (back pain).   metFORMIN (GLUCOPHAGE-XR) 500 MG 24 hr tablet TAKE TWO TABLETS BY MOUTH DAILY WITH SUPPER   metoprolol succinate (TOPROL-XL) 50 MG 24 hr tablet TAKE 1 TABLET BY MOUTH DAILY WITH SUPPER. TAKE WITH OR IMMEDIATELY FOLLOWING A MEAL.   omeprazole (PRILOSEC) 20 MG capsule TAKE 1 CAPSULE BY MOUTH EVERY DAY   ONETOUCH VERIO  test strip USE TO TEST BLOOD SUGAR TWICE DAILY DX: E11.42   ramipril (ALTACE) 1.25 MG capsule TAKE 1 CAPSULE BY MOUTH EVERY DAY   SHINGRIX injection    topiramate (TOPAMAX) 50 MG tablet TAKE 1 AND 1/2 TABLETS BY MOUTH DAILY   vitamin B-12 (CYANOCOBALAMIN) 1000 MCG tablet Take 1,000 mcg by mouth daily.   vitamin C (ASCORBIC ACID) 500 MG tablet Take 500 mg by mouth daily.    No current facility-administered medications for this visit. (Other)      REVIEW OF SYSTEMS:    ALLERGIES No Known Allergies  PAST MEDICAL HISTORY Past Medical History:  Diagnosis Date   Chest pain    Diabetes mellitus    DM2 (diabetes mellitus, type 2) (HCC)    GERD (gastroesophageal reflux disease)    HLD (hyperlipidemia)    HTN (hypertension)    PAF (paroxysmal atrial fibrillation) (HCC)    Palpitations    Past Surgical History:  Procedure Laterality Date   APPENDECTOMY     CHOLECYSTECTOMY     COLONOSCOPY     Dr. Michail Sermon   COLONOSCOPY WITH PROPOFOL N/A 11/06/2018   Procedure: COLONOSCOPY WITH PROPOFOL;  Surgeon: Wilford Corner, MD;  Location: [facility];  Service: Endoscopy;  Laterality: N/A;   HERNIA REPAIR     LEG AMPUTATION Left [id number]   Dr. Kendell Bane   POLYPECTOMY  11/06/2018  Procedure: POLYPECTOMY;  Surgeon: Wilford Corner, MD;  Location: [facility];  Service: Endoscopy;;   SHOULDER SURGERY  [id number]   Dr. Meridee Score   TOTAL ANKLE ARTHROPLASTY  [id number]    FAMILY HISTORY Family History  Problem Relation Age of Onset   Heart disease Mother        Living   Stroke Father 42       Deceased   Diabetes Sister    Cancer Sister     SOCIAL HISTORY Social History   Tobacco Use   Smoking status: Former    Years: 12.00    Types: Cigarettes    Quit date: 10/10/1981    Years since quitting: 40.1   Smokeless tobacco: Never  Vaping Use   Vaping Use: Never used  Substance Use Topics   Alcohol use: Yes    Comment: has glass of wine once a week but states rarely     Drug use: No         OPHTHALMIC EXAM:  Base Eye Exam     Visual Acuity (ETDRS)       Right Left   Dist [place] 20/40 -1 20/60   Dist ph [place] 20/40 +2 20/50         Tonometry (Tonopen, 10:15 AM)       Right Left   Pressure 12 16         Pupils       Pupils Dark Light APD   Right PERRL 5 3 None   Left PERRL 5 3 None         Extraocular Movement       Right Left    Full Full         Neuro/Psych     Oriented x3: Yes   Mood/Affect: Normal         Dilation     Left eye: 1.0% Mydriacyl, 2.5% Phenylephrine @ 10:15 AM           Slit Lamp and Fundus Exam     External Exam       Right Left   External Normal Normal         Slit Lamp Exam       Right Left   Lids/Lashes Normal Normal   Conjunctiva/Sclera White and quiet White and quiet   Cornea Clear Clear   Anterior Chamber Deep and quiet Deep and quiet   Iris Round and reactive Round and reactive   Lens Centered posterior chamber intraocular lens Centered posterior chamber intraocular lens   Anterior Vitreous Normal Normal         Fundus Exam       Right Left   Posterior Vitreous  Posterior vitreous detachment   Disc  Normal   C/D Ratio  0.65   Macula  Intermediate age related macular degeneration, Geographic atrophy, Hard drusen, no macular thickening, no hemorrhage   Vessels  Normal, no DR   Periphery  Normal            IMAGING AND PROCEDURES  Imaging and Procedures for 11/24/21  OCT, Retina - OU - Both Eyes       Right Eye Quality was good. Scan locations included subfoveal. Central Foveal Thickness: 221. Progression has been stable. Findings include no SRF, no IRF, abnormal foveal contour, retinal drusen .   Left Eye Quality was good. Scan locations included subfoveal. Central Foveal Thickness: 237. Progression has improved. Findings include pigment epithelial detachment, subretinal scarring, abnormal foveal contour, no SRF.  Notes OD with no signs of CNVM.   OS  much improved,  stability with very little residual  serous retinal detachment as compared to last visit some 11 weeks previous.  We will repeat injection today follow-up in  11weeks     Intravitreal Injection, Pharmacologic Agent - OS - Left Eye       Time Out 11/24/2021. 11:02 AM. Confirmed correct patient, procedure, site, and patient consented.   Anesthesia Topical anesthesia was used. Anesthetic medications included Lidocaine 4%.   Procedure Preparation included Ofloxacin , Tobramycin 0.3%, 10% betadine to eyelids, 5% betadine to ocular surface. A 30 gauge needle was used.   Injection: 2.5 mg bevacizumab 2.5 MG/0.1ML   Route: Intravitreal, Site: Left Eye   NDC: [phone], Lot: [id number]   Post-op Post injection exam found visual acuity of at least counting fingers. The patient tolerated the procedure well. There were no complications. The patient received written and verbal post procedure care education. Post injection medications included ocuflox.              ASSESSMENT/PLAN:  Exudative age-related macular degeneration of left eye with active choroidal neovascularization ([place]) OS with still less activity at 11-week interval with less subretinal fluid over vascularized PED.  We will repeat injection today to maintain and follow-up again in 3 months.  Visual acuity also stable.  Diabetes mellitus without complication ([place]) No detectable diabetic retinopathy today     ICD-10-CM   1. Exudative age-related macular degeneration of left eye with active choroidal neovascularization (HCC)  H35.3221 OCT, Retina - OU - Both Eyes    Intravitreal Injection, Pharmacologic Agent - OS - Left Eye    bevacizumab (AVASTIN) SOSY 2.5 mg    2. Diabetes mellitus without complication (HCC)  W97.9       1.  OS, improved macular anatomy by OCT, with less subretinal fluid active even at 11-week follow-up interval post Avastin.  Repeat injection today to maintain stable acuity and evaluate  again in 3 months  2.  Dilate OU next  3.  Ophthalmic Meds Ordered this visit:  Meds ordered this encounter  Medications   bevacizumab (AVASTIN) SOSY 2.5 mg       Return in about 3 months (around 02/21/2022) for DILATE OU, AVASTIN OCT, OS.  There are no Patient Instructions on file for this visit.   Explained the diagnoses, plan, and follow up with the patient and they expressed understanding.  Patient expressed understanding of the importance of proper follow up care.   Clent Demark Rosario Kushner M.D. Diseases & Surgery of the Retina and Vitreous Retina & Diabetic [place] 11/24/21     Abbreviations: M myopia (nearsighted); A astigmatism; H hyperopia (farsighted); P presbyopia; Mrx spectacle prescription;  CTL contact lenses; OD right eye; OS left eye; OU both eyes  XT exotropia; ET esotropia; PEK punctate epithelial keratitis; PEE punctate epithelial erosions; DES dry eye syndrome; MGD meibomian gland dysfunction; ATs artificial tears; PFAT's preservative free artificial tears; [place] nuclear sclerotic cataract; PSC posterior subcapsular cataract; ERM epi-retinal membrane; PVD posterior vitreous detachment; RD retinal detachment; DM diabetes mellitus; DR diabetic retinopathy; NPDR non-proliferative diabetic retinopathy; PDR proliferative diabetic retinopathy; CSME clinically significant macular edema; DME diabetic macular edema; dbh dot blot hemorrhages; CWS cotton wool spot; POAG primary open angle glaucoma; C/D cup-to-disc ratio; HVF humphrey visual field; GVF goldmann visual field; OCT optical coherence tomography; IOP intraocular pressure; BRVO Branch retinal vein occlusion; CRVO central retinal vein occlusion; CRAO central retinal artery occlusion; BRAO branch retinal artery  occlusion; RT retinal tear; SB scleral buckle; PPV pars plana vitrectomy; VH Vitreous hemorrhage; PRP panretinal laser photocoagulation; IVK intravitreal kenalog; VMT vitreomacular traction; MH Macular hole;  NVD  neovascularization of the disc; NVE neovascularization elsewhere; AREDS age related eye disease study; ARMD age related macular degeneration; POAG primary open angle glaucoma; EBMD epithelial/anterior basement membrane dystrophy; ACIOL anterior chamber intraocular lens; IOL intraocular lens; PCIOL posterior chamber intraocular lens; Phaco/IOL phacoemulsification with intraocular lens placement; [place] photorefractive keratectomy; LASIK laser assisted in situ keratomileusis; HTN hypertension; DM diabetes mellitus; COPD chronic obstructive pulmonary disease

## 2021-12-17 ENCOUNTER — Other Ambulatory Visit: Payer: Self-pay

## 2021-12-17 ENCOUNTER — Encounter: Payer: Medicare Other | Admitting: Family

## 2022-02-07 DIAGNOSIS — R58 Hemorrhage, not elsewhere classified: Secondary | ICD-10-CM | POA: Diagnosis not present

## 2022-02-14 ENCOUNTER — Ambulatory Visit: Payer: Medicare Other | Admitting: Orthopedic Surgery

## 2022-02-14 DIAGNOSIS — Z89511 Acquired absence of right leg below knee: Secondary | ICD-10-CM

## 2022-02-14 NOTE — Progress Notes (Signed)
? ?Office Visit Note ?  ?Patient: Darrell Horne           ?Date of Birth: [birth year]           ?MRN: [id number] ?Visit Date: 02/14/2022 ?             ?Requested by: Sandrea Hughs, NP ?[address] ?[place],  [place] [address] ?PCP: Ngetich, Nelda Bucks, NP ? ?Chief Complaint  ?Patient presents with  ? Left Leg - Edema  ?  Hx BKA about 10 years ago   ? ? ? ? ?HPI: ?Patient is a 76 year old gentleman who is seen for evaluation for his left below-knee amputation.  Patient is about 10 years out from the amputation in [place].  Patient complains of swelling and this is preventing him from using his prosthesis.  Patient states he has used elevation and used the shrinker without relief. ? ?Assessment & Plan: ?Visit Diagnoses: No diagnosis found. ? ?Plan: Patient was provided a prescription for [employer] for a new socket liner materials and supplies.  Patient may benefit from a lymphedema pump to help decrease the swelling. ? ?Follow-Up Instructions: No follow-ups on file.  ? ?Ortho Exam ? ?Patient is alert, oriented, no adenopathy, well-dressed, normal affect, normal respiratory effort. ?Examination there are no ulcers on the residual limb there is increased venous and lymphatic swelling in the residual limb on the left the leg is short. ? ?Patient is an existing left transtibial  amputee. ? ?Patient's current comorbidities are not expected to impact the ability to function with the prescribed prosthesis. ?Patient verbally communicates a strong desire to use a prosthesis. ?Patient currently requires mobility aids to ambulate without a prosthesis.  Expects not to use mobility aids with a new prosthesis. ? ?Patient is a K3 level ambulator that spends a lot of time walking around on uneven terrain over obstacles, up and down stairs, and ambulates with a variable cadence. ? ? ? ? ?Imaging: ?No results found. ?No images are attached to the encounter. ? ?Labs: ?Lab Results  ?Component Value Date  ? HGBA1C 5.6 03/24/2021  ? HGBA1C 5.8 (H)  08/28/2019  ? HGBA1C 6.0 (H) 10/29/2018  ? ESRSEDRATE 21 07/16/2015  ? CRP 1.2 07/16/2015  ? REPTSTATUS 04/18/2016 FINAL 04/17/2016  ? CULT <10,000 COLONIES/mL INSIGNIFICANT GROWTH (A) 04/17/2016  ? ? ? ?Lab Results  ?Component Value Date  ? ALBUMIN 3.1 (L) 09/18/2019  ? ALBUMIN 3.6 11/11/2016  ? ALBUMIN 3.6 11/04/2015  ? ? ?Lab Results  ?Component Value Date  ? MG 1.9 06/20/2011  ? MG 1.7 06/19/2011  ? MG 1.9 02/25/2011  ? ?No results found for: VD25OH ? ?No results found for: PREALBUMIN ? ?  Latest Ref Rng & Units 03/24/2021  ? 10:52 AM 08/07/2020  ? 10:37 AM 09/18/2019  ? 10:03 AM  ?CBC EXTENDED  ?WBC 3.8 - 10.8 Thousand/uL 5.1   4.8   6.5    ?RBC 4.20 - 5.80 Million/uL 4.34   3.61   3.83    ?Hemoglobin 13.2 - 17.1 g/dL 13.9   12.3   12.3    ?HCT 38.5 - 50.0 % 42.3   36.0   38.2    ?Platelets 140 - 400 Thousand/uL 87   71   167    ?NEUT# 1,500 - 7,800 cells/uL 3,565   2,995   4.7    ?Lymph# 850 - 3,900 cells/uL 1,056   1,330   1.3    ? ? ? ?There is no height or  weight on file to calculate BMI. ? ?Orders:  ?No orders of the defined types were placed in this encounter. ? ?No orders of the defined types were placed in this encounter. ? ? ? Procedures: ?No procedures performed ? ?Clinical Data: ?No additional findings. ? ?ROS: ? ?All other systems negative, except as noted in the HPI. ?Review of Systems ? ?Objective: ?Vital Signs: There were no vitals taken for this visit. ? ?Specialty Comments:  ?No specialty comments available. ? ?PMFS History: ?Patient Active Problem List  ? Diagnosis Date Noted  ? Exudative age-related macular degeneration of left eye with active choroidal neovascularization ([place]) 12/16/2020  ? Diabetes mellitus without complication ([place]) 59/56/3875  ? Early stage nonexudative age-related macular degeneration of right eye 12/16/2020  ? Former smoker 12/14/2020  ? Positive hepatitis C antibody test 10/08/2019  ? Personal history of colonic polyps 11/06/2018  ? DDD (degenerative disc disease),  lumbar 08/17/2018  ? DDD (degenerative disc disease), cervical 08/17/2018  ? Hx of BKA, left ([employer]) 11/11/2016  ? High risk medication use 11/11/2016  ? Syncope 04/17/2016  ? Diabetic polyneuropathy associated with type 2 diabetes mellitus ([place]) 12/23/2015  ? Alcoholic peripheral neuropathy ([place]) 12/23/2015  ? Sensory ataxia 12/23/2015  ? Headache 11/04/2015  ? Chronic low back pain 11/04/2015  ? Neuropathy due to medical condition ([place]) 04/08/2015  ? Diabetes ([place]) 04/09/2014  ? PAF (paroxysmal atrial fibrillation) ([place])   ? DM2 (diabetes mellitus, type 2) ([place])   ? HTN (hypertension)   ? Palpitations   ? HLD (hyperlipidemia)   ? ?Past Medical History:  ?Diagnosis Date  ? Chest pain   ? Diabetes mellitus   ? DM2 (diabetes mellitus, type 2) ([place])   ? GERD (gastroesophageal reflux disease)   ? HLD (hyperlipidemia)   ? HTN (hypertension)   ? PAF (paroxysmal atrial fibrillation) ([place])   ? Palpitations   ?  ?Family History  ?Problem Relation Age of Onset  ? Heart disease Mother   ?     Living  ? Stroke Father 65  ?     Deceased  ? Diabetes Sister   ? Cancer Sister   ?  ?Past Surgical History:  ?Procedure Laterality Date  ? APPENDECTOMY    ? CHOLECYSTECTOMY    ? COLONOSCOPY    ? Dr. Michail Sermon  ? COLONOSCOPY WITH PROPOFOL N/A 11/06/2018  ? Procedure: COLONOSCOPY WITH PROPOFOL;  Surgeon: Wilford Corner, MD;  [facility];  Service: Endoscopy;  Laterality: N/A;  ? HERNIA REPAIR    ? LEG AMPUTATION Left [id number]  ? Dr. Kendell Bane  ? POLYPECTOMY  11/06/2018  ? Procedure: POLYPECTOMY;  Surgeon: Wilford Corner, MD;  [facility];  Service: Endoscopy;;  ? SHOULDER SURGERY  [id number]  ? Dr. Meridee Score  ? TOTAL ANKLE ARTHROPLASTY  [id number]  ? ?Social History  ? ?Occupational History  ? Occupation: retired Administrator  ?Tobacco Use  ? Smoking status: Former  ?  Years: 12.00  ?  Types: Cigarettes  ?  Quit date: 10/10/1981  ?  Years since quitting: 40.3  ? Smokeless tobacco: Never  ?Vaping Use  ? Vaping Use:  Never used  ?Substance and Sexual Activity  ? Alcohol use: Yes  ?  Comment: has glass of wine once a week but states rarely   ? Drug use: No  ? Sexual activity: Never  ? ? ? ? ? ?

## 2022-02-15 ENCOUNTER — Encounter: Payer: Self-pay | Admitting: Orthopedic Surgery

## 2022-02-21 ENCOUNTER — Ambulatory Visit (INDEPENDENT_AMBULATORY_CARE_PROVIDER_SITE_OTHER): Payer: Medicare Other | Admitting: Ophthalmology

## 2022-02-21 ENCOUNTER — Encounter (INDEPENDENT_AMBULATORY_CARE_PROVIDER_SITE_OTHER): Payer: Self-pay | Admitting: Ophthalmology

## 2022-02-21 DIAGNOSIS — H353221 Exudative age-related macular degeneration, left eye, with active choroidal neovascularization: Secondary | ICD-10-CM | POA: Diagnosis not present

## 2022-02-21 DIAGNOSIS — E119 Type 2 diabetes mellitus without complications: Secondary | ICD-10-CM | POA: Diagnosis not present

## 2022-02-21 DIAGNOSIS — H353111 Nonexudative age-related macular degeneration, right eye, early dry stage: Secondary | ICD-10-CM

## 2022-02-21 MED ORDER — BEVACIZUMAB 2.5 MG/0.1ML IZ SOSY
2.5000 mg | PREFILLED_SYRINGE | INTRAVITREAL | Status: AC | PRN
  Administered 2022-02-21: 2.5 mg via INTRAVITREAL

## 2022-02-21 NOTE — Assessment & Plan Note (Signed)
No detectable diabetic retinopathy today 

## 2022-02-21 NOTE — Assessment & Plan Note (Signed)
No sign of CNVM by exam or on OCT OD ?

## 2022-02-21 NOTE — Progress Notes (Signed)
? ? ?02/21/2022 ? ?  ? ?CHIEF COMPLAINT ?Patient presents for  ?Chief Complaint  ?Patient presents with  ? Macular Degeneration  ? ? ? ? ?HISTORY OF PRESENT ILLNESS: ?Darrell Horne is a 76 y.o. male who presents to the clinic today for:  ? ?HPI   ?3 mos fu Dilate OU, Avastin OS, OCT. ?Patient states "only thing that has changed is when I am reading I seem to need more light. Everything seems darker. My left eye aggravates me, it is dry." ?Patient denies new FOL or floaters.  ?Patient has no known allergies to any medications. ?Last edited by Laurin Coder on 02/21/2022 10:17 AM.  ?  ? ? ?Referring physician: ?Clent Jacks, MD ?[address] ?[address] ?[place],  [place] [address] ? ?HISTORICAL INFORMATION:  ? ?Selected notes from the [facility] ?  ? ?Lab Results  ?Component Value Date  ? HGBA1C 5.6 03/24/2021  ?  ? ?CURRENT MEDICATIONS: ?No current outpatient medications on file. (Ophthalmic Drugs)  ? ?No current facility-administered medications for this visit. (Ophthalmic Drugs)  ? ?Current Outpatient Medications (Other)  ?Medication Sig  ? amitriptyline (ELAVIL) 50 MG tablet TAKE 1 TABLET BY MOUTH EVERYDAY AT BEDTIME  ? apixaban (ELIQUIS) 5 MG TABS tablet Take 1 tablet (5 mg total) by mouth 2 (two) times daily.  ? atorvastatin (LIPITOR) 40 MG tablet Take 1 tablet (40 mg total) by mouth daily.  ? b complex vitamins tablet Take 1 tablet by mouth daily.  ? folic acid (FOLVITE) 1 MG tablet TAKE 1 TABLET BY MOUTH EVERY DAY  ? gabapentin (NEURONTIN) 600 MG tablet Take 1 tablet (600 mg total) by mouth 3 (three) times daily.  ? ketoconazole (NIZORAL) 2 % cream Apply 1 fingertip amount to each foot daily.  ? Menthol 5 % PTCH Apply 1 patch topically as needed (back pain).  ? metFORMIN (GLUCOPHAGE-XR) 500 MG 24 hr tablet TAKE TWO TABLETS BY MOUTH DAILY WITH SUPPER  ? metoprolol succinate (TOPROL-XL) 50 MG 24 hr tablet TAKE 1 TABLET BY MOUTH DAILY WITH SUPPER. TAKE WITH OR IMMEDIATELY FOLLOWING A MEAL.  ? omeprazole  (PRILOSEC) 20 MG capsule TAKE 1 CAPSULE BY MOUTH EVERY DAY  ? ONETOUCH VERIO test strip USE TO TEST BLOOD SUGAR TWICE DAILY DX: E11.42  ? ramipril (ALTACE) 1.25 MG capsule TAKE 1 CAPSULE BY MOUTH EVERY DAY  ? SHINGRIX injection   ? topiramate (TOPAMAX) 50 MG tablet TAKE 1 AND 1/2 TABLETS BY MOUTH DAILY  ? vitamin B-12 (CYANOCOBALAMIN) 1000 MCG tablet Take 1,000 mcg by mouth daily.  ? vitamin C (ASCORBIC ACID) 500 MG tablet Take 500 mg by mouth daily.   ? ?No current facility-administered medications for this visit. (Other)  ? ? ? ? ?REVIEW OF SYSTEMS: ?ROS   ?Negative for: Constitutional, Gastrointestinal, Neurological, Skin, Genitourinary, Musculoskeletal, HENT, Endocrine, Cardiovascular, Eyes, Respiratory, Psychiatric, Allergic/Imm, Heme/Lymph ?Last edited by Hurman Horn, MD on 02/21/2022 10:48 AM.  ?  ? ? ? ?ALLERGIES ?No Known Allergies ? ?PAST MEDICAL HISTORY ?Past Medical History:  ?Diagnosis Date  ? Chest pain   ? Diabetes mellitus   ? DM2 (diabetes mellitus, type 2) ([place])   ? GERD (gastroesophageal reflux disease)   ? HLD (hyperlipidemia)   ? HTN (hypertension)   ? PAF (paroxysmal atrial fibrillation) ([place])   ? Palpitations   ? ?Past Surgical History:  ?Procedure Laterality Date  ? APPENDECTOMY    ? CHOLECYSTECTOMY    ? COLONOSCOPY    ? Dr. Michail Sermon  ?  COLONOSCOPY WITH PROPOFOL N/A 11/06/2018  ? Procedure: COLONOSCOPY WITH PROPOFOL;  Surgeon: Wilford Corner, MD;  [facility];  Service: Endoscopy;  Laterality: N/A;  ? HERNIA REPAIR    ? LEG AMPUTATION Left [id number]  ? Dr. Kendell Bane  ? POLYPECTOMY  11/06/2018  ? Procedure: POLYPECTOMY;  Surgeon: Wilford Corner, MD;  [facility];  Service: Endoscopy;;  ? SHOULDER SURGERY  [id number]  ? Dr. Meridee Score  ? TOTAL ANKLE ARTHROPLASTY  [id number]  ? ? ?FAMILY HISTORY ?Family History  ?Problem Relation Age of Onset  ? Heart disease Mother   ?     Living  ? Stroke Father 58  ?     Deceased  ? Diabetes Sister   ? Cancer Sister   ? ? ?SOCIAL  HISTORY ?Social History  ? ?Tobacco Use  ? Smoking status: Former  ?  Years: 12.00  ?  Types: Cigarettes  ?  Quit date: 10/10/1981  ?  Years since quitting: 40.3  ? Smokeless tobacco: Never  ?Vaping Use  ? Vaping Use: Never used  ?Substance Use Topics  ? Alcohol use: Yes  ?  Comment: has glass of wine once a week but states rarely   ? Drug use: No  ? ?  ? ?  ? ?OPHTHALMIC EXAM: ? ?Base Eye Exam   ? ? Visual Acuity (ETDRS)   ? ?   Right Left  ? Dist [place] 20/40 20/40 -1  ? Dist ph [place] 20/30 -1 NI  ? ?  ?  ? ? Tonometry (Tonopen, 10:20 AM)   ? ?   Right Left  ? Pressure 15 20  ? ?  ?  ? ? Pupils   ? ?   Pupils Dark Light APD  ? Right PERRL 5 3 None  ? Left PERRL 4 3 None  ? ?  ?  ? ? Extraocular Movement   ? ?   Right Left  ?  Full, Ortho Full, Ortho  ? ?  ?  ? ? Neuro/Psych   ? ? Oriented x3: Yes  ? Mood/Affect: Normal  ? ?  ?  ? ? Dilation   ? ? Both eyes: 1.0% Mydriacyl, 2.5% Phenylephrine @ 10:20 AM  ? ?  ?  ? ?  ? ?Slit Lamp and Fundus Exam   ? ? External Exam   ? ?   Right Left  ? External Normal Normal  ? ?  ?  ? ? Slit Lamp Exam   ? ?   Right Left  ? Lids/Lashes Normal Normal  ? Conjunctiva/Sclera White and quiet White and quiet  ? Cornea Clear Clear  ? Anterior Chamber Deep and quiet Deep and quiet  ? Iris Round and reactive Round and reactive  ? Lens Centered posterior chamber intraocular lens Centered posterior chamber intraocular lens  ? Anterior Vitreous Normal Normal  ? ?  ?  ? ? Fundus Exam   ? ?   Right Left  ? Posterior Vitreous  Posterior vitreous detachment  ? Disc  Normal  ? C/D Ratio  0.65  ? Macula  Intermediate age related macular degeneration, Geographic atrophy, Hard drusen, no macular thickening, no hemorrhage  ? Vessels  Normal, no DR  ? Periphery  Normal  ? ?  ?  ? ?  ? ? ?IMAGING AND PROCEDURES  ?Imaging and Procedures for 02/21/22 ? ?Intravitreal Injection, Pharmacologic Agent - OS - Left Eye   ? ?   ?Time Out ?02/21/2022. 10:52 AM.  Confirmed correct patient, procedure, site, and patient  consented.  ? ?Anesthesia ?Topical anesthesia was used. Anesthetic medications included Lidocaine 4%.  ? ?Procedure ?Preparation included Ofloxacin , Tobramycin 0.3%, 10% betadine to eyelids, 5% betadine to ocular surface. A 30 gauge needle was used.  ? ?Injection: ?2.5 mg bevacizumab 2.5 MG/0.1ML ?  Route: Intravitreal, Site: Left Eye ?  [place]: [id number], Lot: [id number]  ? ?Post-op ?Post injection exam found visual acuity of at least counting fingers. The patient tolerated the procedure well. There were no complications. The patient received written and verbal post procedure care education. Post injection medications included ocuflox.  ? ?  ? ?OCT, Retina - OU - Both Eyes   ? ?   ?Right Eye ?Quality was good. Scan locations included subfoveal. Central Foveal Thickness: 221. Progression has been stable. Findings include no SRF, no IRF, abnormal foveal contour, retinal drusen .  ? ?Left Eye ?Quality was good. Scan locations included subfoveal. Central Foveal Thickness: 220. Progression has improved. Findings include pigment epithelial detachment, subretinal scarring, abnormal foveal contour, no SRF.  ? ?Notes ?OD with no signs of CNVM.  ? ?OS much improved overall yet at 3 months there is a small region of intraretinal fluid temporal to FAZ.,  While the subretinal fluid of serous retinal detachment remains resolved. ? ?Repeat injection OS today and examination next in 6 weeks to maintain improvement ? ?  ? ? ?  ?  ? ?  ?ASSESSMENT/PLAN: ? ?Exudative age-related macular degeneration of left eye with active choroidal neovascularization ([place]) ?OS today maintain resolution of disease at 80-monthinterval.  We will repeat injection today to maintain since onset of treatment and disease with some 15 months prior. ? ? ? ?Early stage nonexudative age-related macular degeneration of right eye ?No sign of CNVM by exam or on OCT OD ? ?Diabetes mellitus without complication ([place] ?No detectable diabetic retinopathy today  ? ?   ICD-10-CM   ?1. Exudative age-related macular degeneration of left eye with active choroidal neovascularization (HCC)  H35.3221 Intravitreal Injection, Pharmacologic Agent - OS - Left Eye  ?  OCT, Retina - OU - Both Eye

## 2022-02-21 NOTE — Assessment & Plan Note (Signed)
OS today maintain resolution of disease at 66-monthinterval.  We will repeat injection today to maintain since onset of treatment and disease with some 15 months prior. ? ? ?

## 2022-03-09 ENCOUNTER — Ambulatory Visit: Payer: Medicare Other | Admitting: Family

## 2022-03-09 ENCOUNTER — Encounter: Payer: Self-pay | Admitting: Family

## 2022-03-09 ENCOUNTER — Ambulatory Visit (INDEPENDENT_AMBULATORY_CARE_PROVIDER_SITE_OTHER): Payer: Medicare Other

## 2022-03-09 DIAGNOSIS — M25511 Pain in right shoulder: Secondary | ICD-10-CM | POA: Diagnosis not present

## 2022-03-09 DIAGNOSIS — G8929 Other chronic pain: Secondary | ICD-10-CM | POA: Diagnosis not present

## 2022-03-09 MED ORDER — LIDOCAINE HCL 1 % IJ SOLN
5.0000 mL | INTRAMUSCULAR | Status: AC | PRN
  Administered 2022-03-09: 5 mL

## 2022-03-09 MED ORDER — METHYLPREDNISOLONE ACETATE 40 MG/ML IJ SUSP
40.0000 mg | INTRAMUSCULAR | Status: AC | PRN
  Administered 2022-03-09: 40 mg via INTRA_ARTICULAR

## 2022-03-09 NOTE — Progress Notes (Signed)
Office Visit Note   Patient: Darrell Horne           Date of Birth: 1945/12/08           MRN: [id number] Visit Date: 03/09/2022              Requested by: Sandrea Hughs, NP [address] [place],  [place] [address] PCP: Sandrea Hughs, NP  Chief Complaint  Patient presents with   Right Shoulder - Pain   Left Leg - Follow-up    Hx BKA, measurements needed for lymphedema pump: Thigh: 52 cm Calf: 39 cm      HPI: The patient is a 76 year old gentleman who presents today for evaluation of right shoulder he has been having pain in the right shoulder for years this episode has been ongoing for weeks to months has been gradually worsening.  He has not had any associated injury in the last several months he complains of loss of range of motion due to pain unable to reach above his head or behind his back without significant pain  Has had Depo-Medrol injection of the shoulder last in August 2021.  Assessment & Plan: Visit Diagnoses:  1. Chronic right shoulder pain     Plan: Rotator cuff tendinopathy.  Depo-Medrol injection of the right shoulder.  Patient tolerated well he will follow-up in the office in 3 to 4 weeks if he fails to improve.  Follow-Up Instructions: No follow-ups on file.   Right Shoulder Exam   Tenderness  The patient is experiencing tenderness in the biceps tendon.  Range of Motion  Passive abduction:  normal   Tests  Impingement: positive Drop arm: negative  Other  Erythema: absent     Patient is alert, oriented, no adenopathy, well-dressed, normal affect, normal respiratory effort.  Imaging: No results found. No images are attached to the encounter.  Labs: Lab Results  Component Value Date   HGBA1C 5.6 03/24/2021   HGBA1C 5.8 (H) 08/28/2019   HGBA1C 6.0 (H) 10/29/2018   ESRSEDRATE 21 07/16/2015   CRP 1.2 07/16/2015   REPTSTATUS 04/18/2016 FINAL 04/17/2016   CULT <10,000 COLONIES/mL INSIGNIFICANT GROWTH (A) 04/17/2016     Lab Results   Component Value Date   ALBUMIN 3.1 (L) 09/18/2019   ALBUMIN 3.6 11/11/2016   ALBUMIN 3.6 11/04/2015    Lab Results  Component Value Date   MG 1.9 06/20/2011   MG 1.7 06/19/2011   MG 1.9 02/25/2011   No results found for: VD25OH  No results found for: PREALBUMIN    Latest Ref Rng & Units 03/24/2021   10:52 AM 08/07/2020   10:37 AM 09/18/2019   10:03 AM  CBC EXTENDED  WBC 3.8 - 10.8 Thousand/uL 5.1   4.8   6.5    RBC 4.20 - 5.80 Million/uL 4.34   3.61   3.83    Hemoglobin 13.2 - 17.1 g/dL 13.9   12.3   12.3    HCT 38.5 - 50.0 % 42.3   36.0   38.2    Platelets 140 - 400 Thousand/uL 87   71   167    NEUT# 1,500 - 7,800 cells/uL 3,565   2,995   4.7    Lymph# 850 - 3,900 cells/uL 1,056   1,330   1.3       There is no height or weight on file to calculate BMI.  Orders:  Orders Placed This Encounter  Procedures   XR Shoulder Right   No orders  of the defined types were placed in this encounter.    Procedures: Large Joint Inj: R subacromial bursa on 03/09/2022 10:43 AM Indications: pain Details: 22 G 1.5 in needle Medications: 5 mL lidocaine 1 %; 40 mg methylPREDNISolone acetate 40 MG/ML Consent was given by the patient.     Clinical Data: No additional findings.  ROS:  All other systems negative, except as noted in the HPI. Review of Systems  Objective: Vital Signs: There were no vitals taken for this visit.  Specialty Comments:  No specialty comments available.  PMFS History: Patient Active Problem List   Diagnosis Date Noted   Exudative age-related macular degeneration of left eye with active choroidal neovascularization ([place]) 12/16/2020   Diabetes mellitus without complication ([place]) 38/75/6433   Early stage nonexudative age-related macular degeneration of right eye 12/16/2020   Former smoker 12/14/2020   Positive hepatitis C antibody test 10/08/2019   Personal history of colonic polyps 11/06/2018   DDD (degenerative disc disease), lumbar 08/17/2018    DDD (degenerative disc disease), cervical 08/17/2018   Hx of BKA, left ([place]) 11/11/2016   High risk medication use 11/11/2016   Syncope 04/17/2016   Diabetic polyneuropathy associated with type 2 diabetes mellitus ([place]) 29/51/8841   Alcoholic peripheral neuropathy ([place]) 12/23/2015   Sensory ataxia 12/23/2015   Headache 11/04/2015   Chronic low back pain 11/04/2015   Neuropathy due to medical condition ([place]) 04/08/2015   Diabetes ([place]) 04/09/2014   PAF (paroxysmal atrial fibrillation) (H[facility])    DM2 (diabetes mellitus, type 2) (HCC)    HTN (hypertension)    Palpitations    HLD (hyperlipidemia)    Past Medical History:  Diagnosis Date   Chest pain    Diabetes mellitus    DM2 (diabetes mellitus, type 2) (HCC)    GERD (gastroesophageal reflux disease)    HLD (hyperlipidemia)    HTN (hypertension)    PAF (paroxysmal atrial fibrillation) (HCC)    Palpitations     Family History  Problem Relation Age of Onset   Heart disease Mother        Living   Stroke Father 58       Deceased   Diabetes Sister    Cancer Sister     Past Surgical History:  Procedure Laterality Date   APPENDECTOMY     CHOLECYSTECTOMY     COLONOSCOPY     Dr. Michail Sermon   COLONOSCOPY WITH PROPOFOL N/A 11/06/2018   Procedure: COLONOSCOPY WITH PROPOFOL;  Surgeon: Wilford Corner, MD;  [facility];  Service: Endoscopy;  Laterality: N/A;   HERNIA REPAIR     LEG AMPUTATION Left [id number]   Dr. Kendell Bane   POLYPECTOMY  11/06/2018   Procedure: POLYPECTOMY;  Surgeon: Wilford Corner, MD;  [facility];  Service: Endoscopy;;   SHOULDER SURGERY  [id number]   Dr. Meridee Score   TOTAL ANKLE ARTHROPLASTY  [id number]   Social History   Occupational History   Occupation: retired Administrator  Tobacco Use   Smoking status: Former    Years: 12.00    Types: Cigarettes    Quit date: 10/10/1981    Years since quitting: 40.4   Smokeless tobacco: Never  Vaping Use   Vaping Use: Never used  Substance  and Sexual Activity   Alcohol use: Yes    Comment: has glass of wine once a week but states rarely    Drug use: No   Sexual activity: Never

## 2022-04-04 ENCOUNTER — Encounter (INDEPENDENT_AMBULATORY_CARE_PROVIDER_SITE_OTHER): Payer: Medicare Other | Admitting: Ophthalmology

## 2022-04-06 ENCOUNTER — Encounter: Payer: Self-pay | Admitting: Orthopedic Surgery

## 2022-04-06 ENCOUNTER — Ambulatory Visit: Payer: Medicare Other | Admitting: Orthopedic Surgery

## 2022-04-06 DIAGNOSIS — M12811 Other specific arthropathies, not elsewhere classified, right shoulder: Secondary | ICD-10-CM

## 2022-04-06 DIAGNOSIS — M25511 Pain in right shoulder: Secondary | ICD-10-CM

## 2022-04-06 DIAGNOSIS — G8929 Other chronic pain: Secondary | ICD-10-CM | POA: Diagnosis not present

## 2022-04-06 NOTE — Progress Notes (Signed)
Office Visit Note   Patient: Darrell Horne           Date of Birth: Sep 04, 1946           MRN: [id number] Visit Date: 04/06/2022 Requested by: Darrell Hughs, NP [address] [place],  [place] [address] PCP: Darrell Hughs, NP  Subjective: Chief Complaint  Patient presents with   Right Shoulder - Pain    HPI: Darrell Horne is a 76 year old patient with constant right shoulder pain.  Had an injection several weeks ago without much relief.  Has had pain for months at this time.  He did have rotator cuff surgery which was primarily debridement in 2016 which did help him for several years.  Describes decreased range of motion for the last year along with significant pain.  Hemoglobin A1c by patient report is in the 6-7 range.  Has a history of left BKA done about 10 years ago.  Takes Eliquis for atrial fibrillation.              ROS: All systems reviewed are negative as they relate to the chief complaint within the history of present illness.  Patient denies  fevers or chills.   Assessment & Plan: Visit Diagnoses:  1. Chronic right shoulder pain   2. Rotator cuff arthropathy of right shoulder     Plan: Impression is right shoulder rotator cuff arthropathy with pain and loss of function.  Plan is reverse shoulder replacement.  Risk and benefits are discussed with the patient include not limited to infection or vessel damage instability along with incomplete restoration of function and incomplete pain relief.  Perioperative complications also possible such as stroke heart attack and other medical complications.  Patient understands risk and benefits and wishes to proceed.  All questions answered.  Follow-Up Instructions: No follow-ups on file.   Orders:  Orders Placed This Encounter  Procedures   CT SHOULDER RIGHT WO CONTRAST   No orders of the defined types were placed in this encounter.     Procedures: No procedures performed   Clinical Data: No additional  findings.  Objective: Vital Signs: There were no vitals taken for this visit.  Physical Exam:   Constitutional: Patient appears well-developed HEENT:  Head: Normocephalic Eyes:EOM are normal Neck: Normal range of motion Cardiovascular: Normal rate Pulmonary/chest: Effort normal Neurologic: Patient is alert Skin: Skin is warm Psychiatric: Patient has normal mood and affect   Ortho Exam: Ortho exam demonstrates forward flexion and abduction both below 30 degrees.  Does have rotator cuff weakness to infraspinatus and supraspinatus testing with slightly more strength with subscap testing.  Deltoid is functional.  Motor or sensory function to the hand is intact.  No masses lymphadenopathy or skin changes noted in that shoulder girdle region.  Review of prior radiographs demonstrates superior migration of the humeral head consistent with diagnosis of rotator cuff arthropathy.  Specialty Comments:  No specialty comments available.  Imaging: No results found.   PMFS History: Patient Active Problem List   Diagnosis Date Noted   Exudative age-related macular degeneration of left eye with active choroidal neovascularization ([facility]) 12/16/2020   Diabetes mellitus without complication ([facility]) 91/69/4503   Early stage nonexudative age-related macular degeneration of right eye 12/16/2020   Former smoker 12/14/2020   Positive hepatitis C antibody test 10/08/2019   Personal history of colonic polyps 11/06/2018   DDD (degenerative disc disease), lumbar 08/17/2018   DDD (degenerative disc disease), cervical 08/17/2018   Hx of BKA, left ([facility])  11/11/2016   High risk medication use 11/11/2016   Syncope 04/17/2016   Diabetic polyneuropathy associated with type 2 diabetes mellitus ([facility]) 32/99/2426   Alcoholic peripheral neuropathy ([facility]) 12/23/2015   Sensory ataxia 12/23/2015   Headache 11/04/2015   Chronic low back pain 11/04/2015   Neuropathy due to medical condition ([facility]) 04/08/2015   Diabetes  ([facility]) 04/09/2014   PAF (paroxysmal atrial fibrillation) (HCC)    DM2 (diabetes mellitus, type 2) (HCC)    HTN (hypertension)    Palpitations    HLD (hyperlipidemia)    Past Medical History:  Diagnosis Date   Chest pain    Diabetes mellitus    DM2 (diabetes mellitus, type 2) (HCC)    GERD (gastroesophageal reflux disease)    HLD (hyperlipidemia)    HTN (hypertension)    PAF (paroxysmal atrial fibrillation) (HCC)    Palpitations     Family History  Problem Relation Age of Onset   Heart disease Mother        Living   Stroke Father 87       Deceased   Diabetes Sister    Cancer Sister     Past Surgical History:  Procedure Laterality Date   APPENDECTOMY     CHOLECYSTECTOMY     COLONOSCOPY     Dr. Michail Sermon   COLONOSCOPY WITH PROPOFOL N/A 11/06/2018   Procedure: COLONOSCOPY WITH PROPOFOL;  Surgeon: Wilford Corner, MD;  Location: [facility];  Service: Endoscopy;  Laterality: N/A;   HERNIA REPAIR     LEG AMPUTATION Left [id number]   Dr. Kendell Bane   POLYPECTOMY  11/06/2018   Procedure: POLYPECTOMY;  Surgeon: Wilford Corner, MD;  Location: [facility];  Service: Endoscopy;;   SHOULDER SURGERY  [id number]   Dr. Meridee Score   TOTAL ANKLE ARTHROPLASTY  [id number]   Social History   Occupational History   Occupation: retired Administrator  Tobacco Use   Smoking status: Former    Years: 12.00    Types: Cigarettes    Quit date: 10/10/1981    Years since quitting: 40.5   Smokeless tobacco: Never  Vaping Use   Vaping Use: Never used  Substance and Sexual Activity   Alcohol use: Yes    Comment: has glass of wine once a week but states rarely    Drug use: No   Sexual activity: Never

## 2022-04-08 ENCOUNTER — Telehealth: Payer: Self-pay | Admitting: *Deleted

## 2022-04-08 NOTE — Telephone Encounter (Signed)
Received PreOp Paperwork From [facility] [phone].  Patient having Surgery 06/02/2022 for Right Reverse Shoulder Arthroplasty  Requesting Perioperative Instructions.   Patient has not been seen since October (Video)  and No Upcoming appointment.   Called patient to Schedule an appointment to have Paperwork filled out and signed, [facility] to return call.

## 2022-04-14 DIAGNOSIS — Z89512 Acquired absence of left leg below knee: Secondary | ICD-10-CM | POA: Diagnosis not present

## 2022-04-15 ENCOUNTER — Ambulatory Visit: Payer: Medicare Other | Admitting: Family

## 2022-04-18 ENCOUNTER — Ambulatory Visit (INDEPENDENT_AMBULATORY_CARE_PROVIDER_SITE_OTHER): Payer: Medicare Other | Admitting: Family

## 2022-04-18 ENCOUNTER — Encounter: Payer: Self-pay | Admitting: Family

## 2022-04-18 ENCOUNTER — Other Ambulatory Visit: Payer: Self-pay

## 2022-04-18 VITALS — BP 130/72 | HR 93 | Temp 97.7°F | Resp 16 | Ht 70.0 in | Wt 220.0 lb

## 2022-04-18 DIAGNOSIS — Z23 Encounter for immunization: Secondary | ICD-10-CM

## 2022-04-18 DIAGNOSIS — I10 Essential (primary) hypertension: Secondary | ICD-10-CM

## 2022-04-18 DIAGNOSIS — M25511 Pain in right shoulder: Secondary | ICD-10-CM

## 2022-04-18 DIAGNOSIS — E1142 Type 2 diabetes mellitus with diabetic polyneuropathy: Secondary | ICD-10-CM

## 2022-04-18 DIAGNOSIS — Z89512 Acquired absence of left leg below knee: Secondary | ICD-10-CM | POA: Diagnosis not present

## 2022-04-18 DIAGNOSIS — G8929 Other chronic pain: Secondary | ICD-10-CM | POA: Diagnosis not present

## 2022-04-18 DIAGNOSIS — R2681 Unsteadiness on feet: Secondary | ICD-10-CM | POA: Diagnosis not present

## 2022-04-18 DIAGNOSIS — E782 Mixed hyperlipidemia: Secondary | ICD-10-CM | POA: Diagnosis not present

## 2022-04-18 DIAGNOSIS — I480 Paroxysmal atrial fibrillation: Secondary | ICD-10-CM | POA: Diagnosis not present

## 2022-04-18 NOTE — Progress Notes (Signed)
Provider: Marlowe Sax FNP-C  Adan Baehr, Nelda Bucks, NP  Patient Care Team: Hazem Kenner, Nelda Bucks, NP as PCP - General (Family Medicine) Clent Jacks, MD as Consulting Physician (Ophthalmology) Alda Berthold, DO as Consulting Physician (Neurology)  Extended Emergency Contact Information Primary Emergency Contact: Cornick,Anna Address: [address]          [place], [place] [address] Johnnette Litter of [place] Phone: [phone] Relation: Spouse Secondary Emergency Contact: Thomasenia Bottoms [place] of [place] Phone: [phone] Relation: Sister  Code Status:  Full Code  Goals of care: Advanced Directive information    04/18/2022   11:44 AM  Advanced Directives  Does Patient Have a Medical Advance Directive? No  Would patient like information on creating a medical advance directive? No - Patient declined     Chief Complaint  Patient presents with   Medical Management of Chronic Issues    6 months follow up  Also here for a referral for physical therapy Pre-op surgical clearance for right reverse shoulder arthroplasty      HPI:  Pt is a 76 y.o. male seen today for routine visit for medical management of chronic issues and pre-op surgical clearance for right reverse shoulder arthroplasty scheduled for 06/02/2022 with Dr.Dean Belenda Cruise scott. He is here with spouse.Has not been seen here since 07/21/2021 had a video visit.  Has a medical history of type 2 diabetes mellitus with polyneuropathy, hypertension, hyperlipidemia, arterial fibrillation on Eliquis 5 mg twice daily, left BKA, degenerative disc disease, exudative age-related macular degeneration of left eye among other conditions.  He request Home health Physical Therapy to assist with gait he was recently fitted with new left BKA prosthetic.  No recent fall episodes reported  Wife stated does not check blood sugars at home but takes medication as directed.  Currently on metformin 500 mg daily. Has had 10 pound  weight loss since last seen.  Describes appetite as good  He is due for COVID-19 booster vaccine but declines states does not want any more COVID-19 vaccine.  Also due for Shingrix vaccine has had x1 dose given 03/09/2021.  Though wife thinks has had both vaccines.  She will verify with the pharmacy then will update records.  Due for second pneumococcal 23 vaccine.  Agrees to get vaccine this visit.  Past Medical History:  Diagnosis Date   Chest pain    Diabetes mellitus    DM2 (diabetes mellitus, type 2) (HCC)    GERD (gastroesophageal reflux disease)    HLD (hyperlipidemia)    HTN (hypertension)    PAF (paroxysmal atrial fibrillation) (HCC)    Palpitations    Past Surgical History:  Procedure Laterality Date   APPENDECTOMY     CHOLECYSTECTOMY     COLONOSCOPY     Dr. Michail Sermon   COLONOSCOPY WITH PROPOFOL N/A 11/06/2018   Procedure: COLONOSCOPY WITH PROPOFOL;  Surgeon: Wilford Corner, MD;  [facility];  Service: Endoscopy;  Laterality: N/A;   HERNIA REPAIR     LEG AMPUTATION Left [id number]   Dr. Kendell Bane   POLYPECTOMY  11/06/2018   Procedure: POLYPECTOMY;  Surgeon: Wilford Corner, MD;  [facility];  Service: Endoscopy;;   SHOULDER SURGERY  [id number]   Dr. Meridee Score   TOTAL ANKLE ARTHROPLASTY  [id number]    No Known Allergies  Outpatient Encounter Medications as of 04/18/2022  Medication Sig   amitriptyline (ELAVIL) 50 MG tablet TAKE 1 TABLET BY MOUTH EVERYDAY AT BEDTIME   apixaban (ELIQUIS) 5 MG TABS tablet  Take 1 tablet (5 mg total) by mouth 2 (two) times daily.   atorvastatin (LIPITOR) 40 MG tablet Take 1 tablet (40 mg total) by mouth daily.   b complex vitamins tablet Take 1 tablet by mouth daily.   folic acid (FOLVITE) 1 MG tablet TAKE 1 TABLET BY MOUTH EVERY DAY   gabapentin (NEURONTIN) 600 MG tablet Take 1 tablet (600 mg total) by mouth 3 (three) times daily.   ketoconazole (NIZORAL) 2 % cream Apply 1 fingertip amount to each foot daily.    Menthol 5 % PTCH Apply 1 patch topically as needed (back pain).   metFORMIN (GLUCOPHAGE-XR) 500 MG 24 hr tablet TAKE TWO TABLETS BY MOUTH DAILY WITH SUPPER   metoprolol succinate (TOPROL-XL) 50 MG 24 hr tablet TAKE 1 TABLET BY MOUTH DAILY WITH SUPPER. TAKE WITH OR IMMEDIATELY FOLLOWING A MEAL.   omeprazole (PRILOSEC) 20 MG capsule TAKE 1 CAPSULE BY MOUTH EVERY DAY   ONETOUCH VERIO test strip USE TO TEST BLOOD SUGAR TWICE DAILY DX: E11.42   ramipril (ALTACE) 1.25 MG capsule TAKE 1 CAPSULE BY MOUTH EVERY DAY   SHINGRIX injection    topiramate (TOPAMAX) 50 MG tablet TAKE 1 AND 1/2 TABLETS BY MOUTH DAILY   vitamin B-12 (CYANOCOBALAMIN) 1000 MCG tablet Take 1,000 mcg by mouth daily.   vitamin C (ASCORBIC ACID) 500 MG tablet Take 500 mg by mouth daily.    No facility-administered encounter medications on file as of 04/18/2022.    Review of Systems  Constitutional:  Negative for appetite change, chills, fatigue, fever and unexpected weight change.  HENT:  Positive for hearing loss. Negative for congestion, dental problem, ear discharge, ear pain, facial swelling, nosebleeds, postnasal drip, rhinorrhea, sinus pressure, sinus pain, sneezing, sore throat, tinnitus and trouble swallowing.   Eyes:  Negative for pain, discharge, redness, itching and visual disturbance.  Respiratory:  Negative for cough, chest tightness, shortness of breath and wheezing.   Cardiovascular:  Negative for chest pain, palpitations and leg swelling.  Gastrointestinal:  Negative for abdominal distention, abdominal pain, blood in stool, constipation, diarrhea, nausea and vomiting.  Endocrine: Negative for cold intolerance, heat intolerance, polydipsia, polyphagia and polyuria.  Genitourinary:  Negative for difficulty urinating, dysuria, flank pain, frequency and urgency.  Musculoskeletal:  Positive for arthralgias and gait problem. Negative for back pain, joint swelling, myalgias, neck pain and neck stiffness.       Left BKA   Skin:  Negative for color change, pallor, rash and wound.  Neurological:  Negative for dizziness, syncope, speech difficulty, weakness, light-headedness and headaches.       Neuropathy to lower extremities  Hematological:  Does not bruise/bleed easily.  Psychiatric/Behavioral:  Negative for agitation, behavioral problems, confusion, hallucinations, self-injury, sleep disturbance and suicidal ideas. The patient is not nervous/anxious.     Immunization History  Administered Date(s) Administered   DTaP 10/10/2014   Fluad Quad(high Dose 65+) 08/28/2019, 08/07/2020   Influenza,inj,Quad PF,6+ Mos 08/05/2016   Influenza-Unspecified 10/10/2014   PFIZER(Purple Top)SARS-COV-2 Vaccination 01/28/2020, 02/18/2020   Pneumococcal Conjugate-13 11/11/2016   Pneumococcal Polysaccharide-23 04/18/2022   Pneumococcal-Unspecified 10/10/2014   Tdap 12/01/2020   Zoster Recombinat (Shingrix) 03/09/2021   Pertinent  Health Maintenance Due  Topic Date Due   FOOT EXAM  05/19/2021   HEMOGLOBIN A1C  09/23/2021   INFLUENZA VACCINE  05/10/2022   OPHTHALMOLOGY EXAM  06/29/2022   COLONOSCOPY (Pts 45-65yr Insurance coverage will need to be confirmed)  Discontinued      10/08/2019    9:14 AM 11/26/2020  10:14 AM 12/14/2020    2:53 PM 03/24/2021   10:08 AM 04/18/2022   11:44 AM  Fall Risk  Falls in the past year? 0 0 0 0 0  Was there an injury with Fall?  0 0 0 0  Fall Risk Category Calculator  0 0 0 0  Fall Risk Category  Low Low Low Low  Patient Fall Risk Level High fall risk Low fall risk Low fall risk Low fall risk Low fall risk  Patient at Risk for Falls Due to Impaired balance/gait;Impaired mobility    No Fall Risks  Fall risk Follow up Falls evaluation completed;Education provided;Falls prevention discussed    Falls evaluation completed   Functional Status Survey:    Vitals:   04/18/22 1511  BP: 130/72  Pulse: 93  Resp: 16  Temp: 97.7 F (36.5 C)  SpO2: 99%  Weight: 220 lb (99.8 kg)  Height:  5' 10" (1.778 m)   Body mass index is 31.57 kg/m. Physical Exam Vitals reviewed.  Constitutional:      General: He is not in acute distress.    Appearance: Normal appearance. He is normal weight. He is not ill-appearing or diaphoretic.  HENT:     Head: Normocephalic.     Right Ear: Tympanic membrane, ear canal and external ear normal. There is no impacted cerumen.     Left Ear: Tympanic membrane, ear canal and external ear normal. There is no impacted cerumen.     Nose: Nose normal. No congestion or rhinorrhea.     Mouth/Throat:     Mouth: Mucous membranes are moist.     Pharynx: Oropharynx is clear. No oropharyngeal exudate or posterior oropharyngeal erythema.  Eyes:     General: No scleral icterus.       Right eye: No discharge.        Left eye: No discharge.     Extraocular Movements: Extraocular movements intact.     Conjunctiva/sclera: Conjunctivae normal.     Pupils: Pupils are equal, round, and reactive to light.  Neck:     Vascular: No carotid bruit.  Cardiovascular:     Rate and Rhythm: Normal rate. Rhythm irregular.     Pulses: Normal pulses.     Heart sounds: Normal heart sounds. No murmur heard.    No friction rub. No gallop.  Pulmonary:     Effort: Pulmonary effort is normal. No respiratory distress.     Breath sounds: Normal breath sounds. No wheezing, rhonchi or rales.  Chest:     Chest wall: No tenderness.  Abdominal:     General: Bowel sounds are normal. There is no distension.     Palpations: Abdomen is soft. There is no mass.     Tenderness: There is no abdominal tenderness. There is no right CVA tenderness, left CVA tenderness, guarding or rebound.  Musculoskeletal:        General: No swelling or tenderness.     Right shoulder: No swelling or effusion. Decreased range of motion.     Cervical back: Normal range of motion. No rigidity or tenderness.     Right lower leg: No edema.     Comments: Left leg prosthetic in place     Left Lower Extremity: Left  leg is amputated below knee.  Lymphadenopathy:     Cervical: No cervical adenopathy.  Skin:    General: Skin is warm and dry.     Coloration: Skin is not pale.     Findings: No bruising, erythema, lesion  or rash.  Neurological:     Mental Status: He is alert and oriented to person, place, and time.     Cranial Nerves: No cranial nerve deficit.     Sensory: No sensory deficit.     Motor: No weakness.     Coordination: Coordination normal.     Gait: Gait abnormal.  Psychiatric:        Mood and Affect: Mood normal.        Speech: Speech normal.        Behavior: Behavior normal.        Thought Content: Thought content normal.        Judgment: Judgment normal.     Labs reviewed: No results for input(s): "NA", "K", "CL", "CO2", "GLUCOSE", "BUN", "CREATININE", "CALCIUM", "MG", "PHOS" in the last 8760 hours. No results for input(s): "AST", "ALT", "ALKPHOS", "BILITOT", "PROT", "ALBUMIN" in the last 8760 hours. No results for input(s): "WBC", "NEUTROABS", "HGB", "HCT", "MCV", "PLT" in the last 8760 hours. Lab Results  Component Value Date   TSH 1.39 03/24/2021   Lab Results  Component Value Date   HGBA1C 5.6 03/24/2021   Lab Results  Component Value Date   CHOL 158 03/24/2021   HDL 72 03/24/2021   LDLCALC 70 03/24/2021   TRIG 81 03/24/2021   CHOLHDL 2.2 03/24/2021    Significant Diagnostic Results in last 30 days:  No results found.  Assessment/Plan 1. Unsteady gait -No recent falls -Recently fitted for new left below the knee prosthesis requires physical therapy for gait stability, range of motion exercise and muscle strengthening.  We will order home health PT - Ambulatory referral to Home Health: home health PT  2. Type 2 diabetes mellitus with diabetic polyneuropathy, unspecified whether long term insulin use (HCC) Lab Results  Component Value Date   HGBA1C 5.6 03/24/2021   No home CBG reading for evaluation -Continue on metformin -Up-to-date on annual eye and  foot exam.  Continue to follow-up with ophthalmology and podiatrist -On gabapentin for neuropathy -Continue on atorvastatin for cardiovascular event prevention.  On Eliquis for anticoagulation. - TSH; Future - COMPLETE METABOLIC PANEL WITH GFR; Future - CBC with Differential/Platelet; Future - Hemoglobin A1c; Future  3. Essential hypertension Blood pressure well controlled at goal -Continue on Metoprolol and ramipril - COMPLETE METABOLIC PANEL WITH GFR; Future - CBC with Differential/Platelet; Future  4. Hx of BKA, left ([place]) S/p recent new prosthetic - Ambulatory referral to Home Health: Home health physical therapy for evaluation of gait, range of motion, exercise and muscle strengthening  5. Mixed hyperlipidemia Previous LDL at goal no recent lab work for evaluation -Continue on atorvastatin - Lipid panel; Future  6. PAF (paroxysmal atrial fibrillation) (HCC) Heart rate irregular -Continue on metoprolol for heart rate control -Continue on apixaban 5 mg tablet twice daily for anticoagulation -We will consult with a cardiologist for preop clearance.  Has not been seen by cardiologist for several years. - Ambulatory referral to Cardiology  7. Need for 23-polyvalent pneumococcal polysaccharide vaccine Afebrile Pneumococcal 23 vaccine administered today by CMA no reaction reported.   8. Chronic right shoulder pain Chronic Schedule for right shoulder reverse arthroplasty to be done by Dr. Meredith Pel on 06/02/2022 -Will refer to cardiologist for surgical clearance due to A-fib on Eliquis 5 mg tablet twice daily has not been seen for several years.Heart rate irregular.   Family/ staff Communication: Reviewed plan of care with patient and patient wife verbalized understanding  Labs/tests ordered:  - CBC with Differential/Platelet -  CMP with eGFR([facility]) - TSH - Hgb A1C - Lipid panel  Next Appointment: Return in about 6 months (around 10/19/2022), or if symptoms worsen  or fail to improve, for medical mangement of chronic issues.Fasting labs Tomorrow.   Sandrea Hughs, NP

## 2022-04-18 NOTE — Telephone Encounter (Signed)
Patient has scheduled appointment for today, 7/10.  Paperwork given to [place], working with [employer].

## 2022-04-19 ENCOUNTER — Other Ambulatory Visit: Payer: Medicare Other

## 2022-04-19 DIAGNOSIS — I10 Essential (primary) hypertension: Secondary | ICD-10-CM | POA: Diagnosis not present

## 2022-04-19 DIAGNOSIS — E782 Mixed hyperlipidemia: Secondary | ICD-10-CM

## 2022-04-19 DIAGNOSIS — E1142 Type 2 diabetes mellitus with diabetic polyneuropathy: Secondary | ICD-10-CM | POA: Diagnosis not present

## 2022-04-19 NOTE — Telephone Encounter (Signed)
PreOp Paperwork completed and faxed to [facility] at [facility] Fax: [phone].   Darrell Horne stated on Form "Recommend surgical clearance by Cardiology due to Atrial Fibrillation on Apixaban '5mg'$  tablet twice daily. Urgent referral to Cardiologist ordered today 04/18/2022."  Copy sent to [place].

## 2022-04-21 ENCOUNTER — Telehealth: Payer: Self-pay

## 2022-04-21 LAB — CBC WITH DIFFERENTIAL/PLATELET
Absolute Monocytes: 523 cells/uL (ref 200–950)
Basophils Absolute: 22 cells/uL (ref 0–200)
Basophils Relative: 0.4 %
Eosinophils Absolute: 132 cells/uL (ref 15–500)
Eosinophils Relative: 2.4 %
HCT: 37.4 % — ABNORMAL LOW (ref 38.5–50.0)
Hemoglobin: 12.8 g/dL — ABNORMAL LOW (ref 13.2–17.1)
Lymphs Abs: 990 cells/uL (ref 850–3900)
MCH: 34.3 pg — ABNORMAL HIGH (ref 27.0–33.0)
MCHC: 34.2 g/dL (ref 32.0–36.0)
MCV: 100.3 fL — ABNORMAL HIGH (ref 80.0–100.0)
MPV: 11.4 fL (ref 7.5–12.5)
Monocytes Relative: 9.5 %
Neutro Abs: 3834 cells/uL (ref 1500–7800)
Neutrophils Relative %: 69.7 %
Platelets: 91 10*3/uL — ABNORMAL LOW (ref 140–400)
RBC: 3.73 10*6/uL — ABNORMAL LOW (ref 4.20–5.80)
RDW: 13 % (ref 11.0–15.0)
Total Lymphocyte: 18 %
WBC: 5.5 10*3/uL (ref 3.8–10.8)

## 2022-04-21 LAB — COMPLETE METABOLIC PANEL WITH GFR
AG Ratio: 0.8 (calc) — ABNORMAL LOW (ref 1.0–2.5)
ALT: 11 U/L (ref 9–46)
AST: 15 U/L (ref 10–35)
Albumin: 2.6 g/dL — ABNORMAL LOW (ref 3.6–5.1)
Alkaline phosphatase (APISO): 110 U/L (ref 35–144)
BUN/Creatinine Ratio: 7 (calc) (ref 6–22)
BUN: 4 mg/dL — ABNORMAL LOW (ref 7–25)
CO2: 27 mmol/L (ref 20–32)
Calcium: 8.2 mg/dL — ABNORMAL LOW (ref 8.6–10.3)
Chloride: 104 mmol/L (ref 98–110)
Creat: 0.56 mg/dL — ABNORMAL LOW (ref 0.70–1.28)
Globulin: 3.2 g/dL (calc) (ref 1.9–3.7)
Glucose, Bld: 90 mg/dL (ref 65–99)
Potassium: 3.8 mmol/L (ref 3.5–5.3)
Sodium: 136 mmol/L (ref 135–146)
Total Bilirubin: 1.5 mg/dL — ABNORMAL HIGH (ref 0.2–1.2)
Total Protein: 5.8 g/dL — ABNORMAL LOW (ref 6.1–8.1)
eGFR: 102 mL/min/{1.73_m2} (ref >=60)

## 2022-04-21 LAB — LIPID PANEL
Cholesterol: 106 mg/dL (ref <200)
HDL: 47 mg/dL (ref >=40)
LDL Cholesterol (Calc): 45 mg/dL (calc)
Non-HDL Cholesterol (Calc): 59 mg/dL (calc) (ref <130)
Total CHOL/HDL Ratio: 2.3 (calc) (ref <5.0)
Triglycerides: 56 mg/dL (ref <150)

## 2022-04-21 LAB — HEMOGLOBIN A1C
Hgb A1c MFr Bld: 4.8 % of total Hgb (ref <5.7)
Mean Plasma Glucose: 91 mg/dL
eAG (mmol/L): 5 mmol/L

## 2022-04-21 LAB — TSH: TSH: 2.3 mIU/L (ref 0.40–4.50)

## 2022-04-21 NOTE — Telephone Encounter (Signed)
Updated patients medication list and added Ensure daily as directed from [place], [place], NP results note.

## 2022-04-22 ENCOUNTER — Ambulatory Visit: Payer: Medicare Other | Admitting: Podiatry

## 2022-04-24 DIAGNOSIS — E1142 Type 2 diabetes mellitus with diabetic polyneuropathy: Secondary | ICD-10-CM | POA: Diagnosis not present

## 2022-04-24 DIAGNOSIS — I10 Essential (primary) hypertension: Secondary | ICD-10-CM | POA: Diagnosis not present

## 2022-04-24 DIAGNOSIS — S81801D Unspecified open wound, right lower leg, subsequent encounter: Secondary | ICD-10-CM | POA: Diagnosis not present

## 2022-04-24 DIAGNOSIS — Z89512 Acquired absence of left leg below knee: Secondary | ICD-10-CM | POA: Diagnosis not present

## 2022-04-24 DIAGNOSIS — Z4781 Encounter for orthopedic aftercare following surgical amputation: Secondary | ICD-10-CM | POA: Diagnosis not present

## 2022-04-26 ENCOUNTER — Telehealth: Payer: Self-pay

## 2022-04-26 DIAGNOSIS — Z89512 Acquired absence of left leg below knee: Secondary | ICD-10-CM | POA: Diagnosis not present

## 2022-04-26 DIAGNOSIS — S81801D Unspecified open wound, right lower leg, subsequent encounter: Secondary | ICD-10-CM | POA: Diagnosis not present

## 2022-04-26 DIAGNOSIS — I10 Essential (primary) hypertension: Secondary | ICD-10-CM | POA: Diagnosis not present

## 2022-04-26 DIAGNOSIS — E1142 Type 2 diabetes mellitus with diabetic polyneuropathy: Secondary | ICD-10-CM | POA: Diagnosis not present

## 2022-04-26 DIAGNOSIS — Z4781 Encounter for orthopedic aftercare following surgical amputation: Secondary | ICD-10-CM | POA: Diagnosis not present

## 2022-04-26 NOTE — Telephone Encounter (Signed)
Spoke with [facility] regarding mr Bright for orders for PT,OT, wound care, and medical social worker for possible entrance into SNF. Spoke with Webb Silversmith and she approved all authorizations for orders

## 2022-04-27 ENCOUNTER — Telehealth: Payer: Self-pay | Admitting: *Deleted

## 2022-04-27 NOTE — Telephone Encounter (Signed)
Okay to give verbal orders.No FL 2 required ?

## 2022-04-27 NOTE — Telephone Encounter (Signed)
Darrell Horne with [facility] requesting verbal orders for Skilled Nursing and Social Worker.   Stated that patient has a History of Falls and developing Bilateral lower extremity wounds.   Please Advise if Verbal orders are ok.

## 2022-04-27 NOTE — Telephone Encounter (Signed)
Clair Gulling Notified and agreed.

## 2022-04-29 ENCOUNTER — Telehealth: Payer: Self-pay

## 2022-04-29 DIAGNOSIS — E1142 Type 2 diabetes mellitus with diabetic polyneuropathy: Secondary | ICD-10-CM | POA: Diagnosis not present

## 2022-04-29 DIAGNOSIS — Z89512 Acquired absence of left leg below knee: Secondary | ICD-10-CM | POA: Diagnosis not present

## 2022-04-29 DIAGNOSIS — Z4781 Encounter for orthopedic aftercare following surgical amputation: Secondary | ICD-10-CM | POA: Diagnosis not present

## 2022-04-29 DIAGNOSIS — I10 Essential (primary) hypertension: Secondary | ICD-10-CM | POA: Diagnosis not present

## 2022-04-29 DIAGNOSIS — S81801D Unspecified open wound, right lower leg, subsequent encounter: Secondary | ICD-10-CM | POA: Diagnosis not present

## 2022-04-29 NOTE — Telephone Encounter (Signed)
Candis Schatz Nurse from [facility] states that patient evaluation is not being done this week. She states that caregiver wont let them come out because she is not feeling well. She states that they will try again next week, to preform Skilled Nursing Evaluation. Message routed to PCP [place], Nelda Bucks, NP as Juluis Rainier. No further action is required.

## 2022-04-29 NOTE — Telephone Encounter (Signed)
Noted  

## 2022-04-29 NOTE — Telephone Encounter (Signed)
Herbert Deaner called back and states that patient has been evaluated by EMS. Patient declined ER visit. He states that patient has frequent falls and would benefit from [facility]. Patient has to call 911 often for falls. Message routed to PCP [place], Nelda Bucks, NP

## 2022-04-29 NOTE — Telephone Encounter (Signed)
Darrell Horne with [facility] states he arrived to the patient's to to find the patient on floor. 911 has been called. Patient reports no injury, 90/70 BP, O2 85, no pain, and no bleeding they are currently awaiting EMS.

## 2022-05-02 ENCOUNTER — Telehealth: Payer: Self-pay | Admitting: Podiatry

## 2022-05-02 DIAGNOSIS — S81801D Unspecified open wound, right lower leg, subsequent encounter: Secondary | ICD-10-CM | POA: Diagnosis not present

## 2022-05-02 DIAGNOSIS — Z89512 Acquired absence of left leg below knee: Secondary | ICD-10-CM | POA: Diagnosis not present

## 2022-05-02 DIAGNOSIS — E1142 Type 2 diabetes mellitus with diabetic polyneuropathy: Secondary | ICD-10-CM | POA: Diagnosis not present

## 2022-05-02 DIAGNOSIS — I10 Essential (primary) hypertension: Secondary | ICD-10-CM | POA: Diagnosis not present

## 2022-05-02 DIAGNOSIS — Z4781 Encounter for orthopedic aftercare following surgical amputation: Secondary | ICD-10-CM | POA: Diagnosis not present

## 2022-05-02 NOTE — Telephone Encounter (Addendum)
Received voicemail from April from [facility] home health stating to please call the pt and get scheduled for next week to see the provider..  Called pts home and pts wife answered and I told her we needed to get pt scheduled per the home health nurse and she was hesitant at first but did agree to get pt scheduled but she said the sores are healing and it did not need to be urgent so he is scheduled for 8.18.23

## 2022-05-03 ENCOUNTER — Telehealth: Payer: Self-pay | Admitting: *Deleted

## 2022-05-03 NOTE — Telephone Encounter (Signed)
Herbert Deaner, PT with [facility] called with Concerns:  Stated that patient has VERY little potential of Progress due to being alone. Stated that patient's Ex Wife stops by a couple times a week.   -Stated that patient needs significant assistance moving from bed to Wheelchair.  -Has Frequent falls in which he calls 911 5-6 times a week to help him up from the floor.  -Has wounds on both legs. Nurse saw him yesterday. Because of the wounds he should not be wearing his Prosthesis leg.  -Has Diabetes and does not follow diet nor checks his blood sugar.   Stated that ideally patient should be in a SNF however he has restricting insurance and patient is fixing to have Shoulder surgery.   Patient is not safe in the home. If it is not possible for patient to go to a Nursing facility, patient needs order for Hospital bed and a new Wheelchair.   Therapist stated that he called the Ex Wife last night and she canceled both appointments for this week stating that patient had too many appointments for the week.

## 2022-05-03 NOTE — Telephone Encounter (Signed)
Recommend social Worker to assist patient with placement in SNF

## 2022-05-03 NOTE — Telephone Encounter (Signed)
Clair Gulling with [facility] Notified.

## 2022-05-04 DIAGNOSIS — Z4781 Encounter for orthopedic aftercare following surgical amputation: Secondary | ICD-10-CM | POA: Diagnosis not present

## 2022-05-04 DIAGNOSIS — S81801D Unspecified open wound, right lower leg, subsequent encounter: Secondary | ICD-10-CM | POA: Diagnosis not present

## 2022-05-04 DIAGNOSIS — E1142 Type 2 diabetes mellitus with diabetic polyneuropathy: Secondary | ICD-10-CM | POA: Diagnosis not present

## 2022-05-04 DIAGNOSIS — Z89512 Acquired absence of left leg below knee: Secondary | ICD-10-CM | POA: Diagnosis not present

## 2022-05-04 DIAGNOSIS — I10 Essential (primary) hypertension: Secondary | ICD-10-CM | POA: Diagnosis not present

## 2022-05-06 ENCOUNTER — Ambulatory Visit
Admission: RE | Admit: 2022-05-06 | Discharge: 2022-05-06 | Disposition: A | Discharge status: Home / Self Care | Payer: Medicare Other | Source: Ambulatory Visit | Attending: Orthopedic Surgery | Admitting: Orthopedic Surgery

## 2022-05-06 DIAGNOSIS — M25511 Pain in right shoulder: Secondary | ICD-10-CM | POA: Diagnosis not present

## 2022-05-06 DIAGNOSIS — G8929 Other chronic pain: Secondary | ICD-10-CM

## 2022-05-10 ENCOUNTER — Encounter: Payer: Self-pay | Admitting: Cardiology

## 2022-05-10 ENCOUNTER — Ambulatory Visit: Payer: Medicare Other | Admitting: Cardiology

## 2022-05-10 VITALS — BP 100/60 | HR 138 | Ht 70.0 in | Wt 198.0 lb

## 2022-05-10 DIAGNOSIS — Z89512 Acquired absence of left leg below knee: Secondary | ICD-10-CM

## 2022-05-10 DIAGNOSIS — I480 Paroxysmal atrial fibrillation: Secondary | ICD-10-CM

## 2022-05-10 DIAGNOSIS — E782 Mixed hyperlipidemia: Secondary | ICD-10-CM | POA: Diagnosis not present

## 2022-05-10 DIAGNOSIS — I10 Essential (primary) hypertension: Secondary | ICD-10-CM | POA: Diagnosis not present

## 2022-05-10 MED ORDER — AMIODARONE HCL 200 MG PO TABS: 200.0000 mg | ORAL_TABLET | Freq: Two times a day (BID) | ORAL | 1 refills | Status: DC

## 2022-05-10 NOTE — Patient Instructions (Signed)
Medication Instructions:  Please start Amiodarone 200 mg one tablet twice daily. This medication maybe decrease to once daily once you see the At Fibrillation clinic. Continue all other medications as listed.  *If you need a refill on your cardiac medications before your next appointment, please call your pharmacy*   Follow-Up: At [facility], you and your health needs are our priority.  As part of our continuing mission to provide you with exceptional heart care, we have created designated Provider Care Teams.  These Care Teams include your primary Cardiologist (physician) and Advanced Practice Providers (APPs -  Physician Assistants and Nurse Practitioners) who all work together to provide you with the care you need, when you need it.  We recommend signing up for the patient portal called "MyChart".  Sign up information is provided on this After Visit Summary.  MyChart is used to connect with patients for Virtual Visits (Telemedicine).  Patients are able to view lab/test results, encounter notes, upcoming appointments, etc.  Non-urgent messages can be sent to your provider as well.   To learn more about what you can do with MyChart, go to [email or web address].    Your next appointment:   2 week(s)  The format for your next appointment:   In Person  Provider:   You will follow up in the [facility] located at [facility]. Your provider will be: Roderic Palau, NP or Clint R. Fenton, PA-C  And Dr Marlou Porch in 1 month.    AFIB CLINIC INFORMATION: Your appointment is scheduled on:      at     . Please arrive 15 minutes early for check-in. The AFib Clinic is located in the Heart and Vascular Specialty Clinics at [facility]. Parking instructions/directions: Midwife C (off [employer]). When you pull in to Entrance C, there is an underground parking garage to your right. The code to enter the garage is 1403. Take the elevators to the first floor. Follow  the signs to the Heart and Vascular Specialty Clinics. You will see registration at the end of the hallway.  Phone number: [place]

## 2022-05-10 NOTE — Progress Notes (Signed)
Cardiology Office Note:    Date:  05/10/2022   ID:  Darrell Horne, DOB 1946-01-24, MRN [id number]  PCP:  Sandrea Hughs, NP   [facility] Providers Cardiologist:  None     Referring MD: Sandrea Hughs, NP   History of Present Illness:    Darrell Horne is a 76 y.o. male here for the evaluation of PAF at the request of Darrell Sax, NP.  He saw his PCP Darrell Sax, NP on 04/18/2022 for pre-op surgical clearance for right reverse shoulder arthroplasty scheduled for 06/02/2022 with Dr.Dean Tonna Horne. He was referred to cardiology for surgical clearance due to atrial fibrillation, on Eliquis and metoprolol 50 mg for heart rate control. During his physical exam his heart rate was found to be irregular. It was noted that he had not been seen by cardiology for several years.   Recently fitted for a new left below the knee prosthesis.   Today: He states he is not feeling well. He is suffering from right shoulder pain. He is still scheduled for right reverse shoulder arthroplasty on 06/02/22.  He is still becoming accustomed to his new left below-knee prosthesis. A little unsteady while walking.  His breathing is "not as deep as it was". He is a former smoker; he quit when he was 76 yo.  He remains compliant with Eliquis.  He denies any palpitations, chest pain, or peripheral edema. No lightheadedness, headaches, syncope, orthopnea, or PND.  His father had multiple strokes. His mother lived to be 16 yo.   Past Medical History:  Diagnosis Date   Chest pain    Diabetes mellitus    DM2 (diabetes mellitus, type 2) (HCC)    GERD (gastroesophageal reflux disease)    HLD (hyperlipidemia)    HTN (hypertension)    PAF (paroxysmal atrial fibrillation) (HCC)    Palpitations     Past Surgical History:  Procedure Laterality Date   APPENDECTOMY     CHOLECYSTECTOMY     COLONOSCOPY     Dr. Michail Sermon   COLONOSCOPY WITH PROPOFOL N/A 11/06/2018   Procedure: COLONOSCOPY WITH PROPOFOL;   Surgeon: Wilford Corner, MD;  Location: [facility] ENDOSCOPY;  Service: Endoscopy;  Laterality: N/A;   HERNIA REPAIR     LEG AMPUTATION Left [id number]   Dr. Kendell Bane   POLYPECTOMY  11/06/2018   Procedure: POLYPECTOMY;  Surgeon: Wilford Corner, MD;  Location: [facility] ENDOSCOPY;  Service: Endoscopy;;   SHOULDER SURGERY  [id number]   Dr. Meridee Score   TOTAL ANKLE ARTHROPLASTY  [id number]    Current Medications: Current Meds  Medication Sig   amiodarone (PACERONE) 200 MG tablet Take 1 tablet (200 mg total) by mouth 2 (two) times daily.   amitriptyline (ELAVIL) 50 MG tablet TAKE 1 TABLET BY MOUTH EVERYDAY AT BEDTIME   apixaban (ELIQUIS) 5 MG TABS tablet Take 1 tablet (5 mg total) by mouth 2 (two) times daily.   atorvastatin (LIPITOR) 40 MG tablet Take 1 tablet (40 mg total) by mouth daily.   b complex vitamins tablet Take 1 tablet by mouth daily.   Ensure (ENSURE) Take 1 Can by mouth daily.   folic acid (FOLVITE) 1 MG tablet TAKE 1 TABLET BY MOUTH EVERY DAY   gabapentin (NEURONTIN) 600 MG tablet Take 1 tablet (600 mg total) by mouth 3 (three) times daily.   ketoconazole (NIZORAL) 2 % cream Apply 1 fingertip amount to each foot daily.   Menthol 5 % PTCH Apply 1 patch topically as needed (back pain).  metFORMIN (GLUCOPHAGE-XR) 500 MG 24 hr tablet TAKE TWO TABLETS BY MOUTH DAILY WITH SUPPER   metoprolol succinate (TOPROL-XL) 50 MG 24 hr tablet TAKE 1 TABLET BY MOUTH DAILY WITH SUPPER. TAKE WITH OR IMMEDIATELY FOLLOWING A MEAL.   omeprazole (PRILOSEC) 20 MG capsule TAKE 1 CAPSULE BY MOUTH EVERY DAY   ONETOUCH VERIO test strip USE TO TEST BLOOD SUGAR TWICE DAILY DX: E11.42   ramipril (ALTACE) 1.25 MG capsule TAKE 1 CAPSULE BY MOUTH EVERY DAY   SHINGRIX injection    topiramate (TOPAMAX) 50 MG tablet TAKE 1 AND 1/2 TABLETS BY MOUTH DAILY   vitamin B-12 (CYANOCOBALAMIN) 1000 MCG tablet Take 1,000 mcg by mouth daily.   vitamin C (ASCORBIC ACID) 500 MG tablet Take 500 mg by mouth daily.      Allergies:    Patient has no known allergies.   Social History   Socioeconomic History   Marital status: Married    Spouse name: Not on file   Number of children: 4   Years of education: Not on file   Highest education level: Not on file  Occupational History   Occupation: retired Administrator  Tobacco Use   Smoking status: Former    Years: 12.00    Types: Cigarettes    Quit date: 10/10/1981    Years since quitting: 40.6   Smokeless tobacco: Never  Vaping Use   Vaping Use: Never used  Substance and Sexual Activity   Alcohol use: Yes    Comment: has glass of wine once a week but states rarely    Drug use: No   Sexual activity: Never  Other Topics Concern   Not on file  Social History Narrative   Diet:   Do you drink/eat things with caffeine?  Yes   Marital status:  Divorced.  What year were you married?  2001   Do you live in a house, assisted living, condo, apartment, trailer, etc.?  Apartment   Is it one or more stories?  1 story   How many persons live in your home?  1   Do you have any pets in your home?  No   Current or past profession:  Truck driver   Do you exercise?  Some  Type and how often:  Walking   Social Determinants of Health   Financial Resource Strain: Low Risk  (01/24/2018)   Overall Financial Resource Strain (CARDIA)    Difficulty of Paying Living Expenses: Not hard at all  Food Insecurity: No Food Insecurity (01/24/2018)   Hunger Vital Sign    Worried About Running Out of Food in the Last Year: Never true    Ran Out of Food in the Last Year: Never true  Transportation Needs: No Transportation Needs (01/24/2018)   PRAPARE - Hydrologist (Medical): No    Lack of Transportation (Non-Medical): No  Physical Activity: Inactive (01/24/2018)   Exercise Vital Sign    Days of Exercise per Week: 0 days    Minutes of Exercise per Session: 0 min  Stress: Stress Concern Present (01/24/2018)   [place]    Feeling of Stress : To some extent  Social Connections: Moderately Isolated (01/24/2018)   Social Connection and Isolation Panel [NHANES]    Frequency of Communication with Friends and Family: Once a week    Frequency of Social Gatherings with Friends and Family: Once a week    Attends Religious Services: More than  4 times per year    Active Member of Clubs or Organizations: No    Attends Archivist Meetings: Never    Marital Status: Divorced     Family History: The patient's family history includes Cancer in his sister; Diabetes in his sister; Heart disease in his mother; Stroke (age of onset: 30) in his father.  ROS:   Please see the history of present illness.    (+) Right shoulder pain All other systems reviewed and are negative.  EKGs/Labs/Other Studies Reviewed:    The following studies were reviewed today:  Echocardiogram 04/19/2016: Study Conclusions   - Left ventricle: The cavity size was normal. Wall thickness was    normal. Systolic function was normal. The estimated ejection    fraction was in the range of 60% to 65%. Wall motion was normal;    there were no regional wall motion abnormalities.  Lexiscan Stress Test 06/23/2015: Nuclear stress EF: 66%. The left ventricular ejection fraction is hyperdynamic (>65%). The study is normal. This is a low risk study. There was no ST segment deviation noted during stress. No T wave inversion was noted during stress.   Low risk stress nuclear study with normal perfusion and normal left ventricular regional and global systolic function.  EKG:  EKG is personally reviewed and interpreted. 05/10/22: Atrial fibrillation. Rate 138 bpm.  Inferior infarct pattern, poor R wave progression.   Recent Labs: 04/19/2022: ALT 11; BUN 4; Creat 0.56; Hemoglobin 12.8; Platelets 91; Potassium 3.8; Sodium 136; TSH 2.30   Recent Lipid Panel    Component Value Date/Time   CHOL 106 04/19/2022  1039   CHOL 144 11/04/2015 0931   TRIG 56 04/19/2022 1039   HDL 47 04/19/2022 1039   HDL 70 11/04/2015 0931   CHOLHDL 2.3 04/19/2022 1039   VLDL 13 04/07/2017 1104   LDLCALC 45 04/19/2022 1039     Risk Assessment/Calculations:    CHA2DS2-VASc Score = 4   This indicates a 4.8% annual risk of stroke. The patient's score is based upon: CHF History: 0 HTN History: 0 Diabetes History: 1 Stroke History: 0 Vascular Disease History: 1 Age Score: 2 Gender Score: 0          Physical Exam:    VS:  BP 100/60 (BP Location: Left Arm, Patient Position: Sitting, Cuff Size: Normal)   Pulse (!) 138   Ht '5\' 10"'$  (1.778 m)   Wt 198 lb (89.8 kg)   BMI 28.41 kg/m     Wt Readings from Last 3 Encounters:  05/10/22 198 lb (89.8 kg)  04/18/22 220 lb (99.8 kg)  03/24/21 221 lb 6.4 oz (100.4 kg)     GEN: Well nourished, well developed in no acute distress; In wheelchair. HEENT: Normal NECK: No JVD; No carotid bruits LYMPHATICS: No lymphadenopathy CARDIAC: RRR, no murmurs, rubs, gallops RESPIRATORY:  Clear to auscultation without rales, wheezing or rhonchi  ABDOMEN: Soft, non-tender, non-distended MUSCULOSKELETAL:  No edema; Left BKA s/p prosthesis  SKIN: Warm and dry NEUROLOGIC:  Alert and oriented x 3 PSYCHIATRIC:  Normal affect   ASSESSMENT:    1. PAF (paroxysmal atrial fibrillation) ([place])   2. Primary hypertension   3. Mixed hyperlipidemia   4. Status post below-knee amputation of left lower extremity (HCC)    PLAN:    In order of problems listed above:  Preoperative risk stratification for shoulder surgery, Dr. Marlou Sa Paroxysmal atrial fibrillation - Atrial fibrillation currently is rapid ventricular response at 138 bpm.  At this time, we  will postpone shoulder surgery until atrial fibrillation is under control. - Plan will be to start amiodarone 200 mg twice a day.  His blood pressure is currently 100/60.  I do not have any room to increase his Toprol-XL 50 mg. -We will  have close follow-up in our atrial fibrillation clinic in 2 weeks.  Hopefully at that time he will have converted.  Obviously if symptoms do occur prior to then, he knows to seek medical attention and let us know. - Most recently ALT is 11, TSH is 2.3 creatinine 0.56 potassium 3.8-4.1.  Continue to monitor amiodarone labs. -If he is still in atrial fibrillation when he sees the atrial fibrillation clinic, please set up for cardioversion.  He does not need a TEE since he has been on Eliquis.   Left below-knee amputation - Getting used to the prosthesis.    Follow-up: Refer to Afib clinic in 2 weeks, will plan for cardioversion if not in sinus rhythm..  Medication Adjustments/Labs and Tests Ordered: Current medicines are reviewed at length with the patient today.  Concerns regarding medicines are outlined above.   Orders Placed This Encounter  Procedures   EKG 12-Lead   Meds ordered this encounter  Medications   amiodarone (PACERONE) 200 MG tablet    Sig: Take 1 tablet (200 mg total) by mouth 2 (two) times daily.    Dispense:  180 tablet    Refill:  1   Patient Instructions  Medication Instructions:  Please start Amiodarone 200 mg one tablet twice daily. This medication maybe decrease to once daily once you see the At Fibrillation clinic. Continue all other medications as listed.  *If you need a refill on your cardiac medications before your next appointment, please call your pharmacy*   Follow-Up: At [facility], you and your health needs are our priority.  As part of our continuing mission to provide you with exceptional heart care, we have created designated Provider Care Teams.  These Care Teams include your primary Cardiologist (physician) and Advanced Practice Providers (APPs -  Physician Assistants and Nurse Practitioners) who all work together to provide you with the care you need, when you need it.  We recommend signing up for the patient portal called "MyChart".  Sign  up information is provided on this After Visit Summary.  MyChart is used to connect with patients for Virtual Visits (Telemedicine).  Patients are able to view lab/test results, encounter notes, upcoming appointments, etc.  Non-urgent messages can be sent to your provider as well.   To learn more about what you can do with MyChart, go to [email or web address].    Your next appointment:   2 week(s)  The format for your next appointment:   In Person  Provider:   You will follow up in the [place] [facility] located at [facility]. Your provider will be: Roderic Palau, NP or Clint R. Fenton, PA-C  And Dr Marlou Porch in 1 month.    AFIB CLINIC INFORMATION: Your appointment is scheduled on:      at     . Please arrive 15 minutes early for check-in. The AFib Clinic is located in the Heart and Vascular Specialty Clinics at [facility]. Parking instructions/directions: Midwife C (off [employer]). When you pull in to Entrance C, there is an underground parking garage to your right. The code to enter the garage is 1403. Take the elevators to the first floor. Follow the signs to the Heart and Vascular Specialty Clinics. You will  see registration at the end of the hallway.  Phone number: [place]         I,Mathew Stumpf,acting as a scribe for Candee Furbish, MD.,have documented all relevant documentation on the behalf of Candee Furbish, MD,as directed by  Candee Furbish, MD while in the presence of Candee Furbish, MD.  I, Candee Furbish, MD, have reviewed all documentation for this visit. The documentation on 05/10/22 for the exam, diagnosis, procedures, and orders are all accurate and complete.   Signed, Candee Furbish, MD  05/10/2022 12:31 PM    [place]

## 2022-05-13 ENCOUNTER — Other Ambulatory Visit: Payer: Self-pay | Admitting: Family

## 2022-05-13 DIAGNOSIS — I10 Essential (primary) hypertension: Secondary | ICD-10-CM

## 2022-05-18 ENCOUNTER — Ambulatory Visit: Payer: Medicare Other | Admitting: Nurse Practitioner

## 2022-05-25 ENCOUNTER — Ambulatory Visit: Payer: Medicare Other | Admitting: Family

## 2022-05-26 ENCOUNTER — Ambulatory Visit (HOSPITAL_COMMUNITY)
Admission: RE | Admit: 2022-05-26 | Discharge: 2022-05-26 | Disposition: A | Discharge status: Home / Self Care | Payer: Medicare Other | Source: Ambulatory Visit | Attending: Nurse Practitioner | Admitting: Nurse Practitioner

## 2022-05-26 VITALS — BP 94/54 | HR 102 | Ht 70.0 in | Wt 201.8 lb

## 2022-05-26 DIAGNOSIS — D6869 Other thrombophilia: Secondary | ICD-10-CM | POA: Diagnosis not present

## 2022-05-26 DIAGNOSIS — I480 Paroxysmal atrial fibrillation: Secondary | ICD-10-CM

## 2022-05-26 DIAGNOSIS — Z602 Problems related to living alone: Secondary | ICD-10-CM | POA: Insufficient documentation

## 2022-05-26 DIAGNOSIS — I4819 Other persistent atrial fibrillation: Secondary | ICD-10-CM | POA: Diagnosis not present

## 2022-05-26 DIAGNOSIS — Z91148 Patient's other noncompliance with medication regimen for other reason: Secondary | ICD-10-CM | POA: Insufficient documentation

## 2022-05-26 NOTE — Progress Notes (Signed)
Primary Care Physician: Sandrea Hughs, NP Referring Physician: Dr. Suan Halter is a 76 y.o. male with a h/o afib on metoprolol and eliquis. He was seen preop and noted to be in Afib with RVR. He was referred to Dr. Marlou Porch. His BP was soft and no room to up titrate BB to manage v rate of 138 bpm. He was started on amiodarone 200 mg bid. Shoulder surgery was postponed. Dr. Kingsley Plan noted  if amio did not convert pt that cardioversion would be needed.   Unfortunately, he remains in afib today but better rate controlled. He is here with his ex wife but lives alone. He has only been taking amiodarone 200 mg daily and missed at least one doe of Eliquis this past Sunday. I discussed with pt and his ex wife how important it was to take meds correctly to be able to perform cardioversion safely. His ex wife is going to pre pour his meds and help to supervise this. Discussed with pt pursuing cardioversion after loading on amio 200 mg bid for another 2 weeks and no missed doses of eliquis.   Today, he denies symptoms of palpitations, chest pain, shortness of breath, orthopnea, PND, lower extremity edema, dizziness, presyncope, syncope, or neurologic sequela. The patient is tolerating medications without difficulties and is otherwise without complaint today.   Past Medical History:  Diagnosis Date   Chest pain    Diabetes mellitus    DM2 (diabetes mellitus, type 2) (HCC)    GERD (gastroesophageal reflux disease)    HLD (hyperlipidemia)    HTN (hypertension)    PAF (paroxysmal atrial fibrillation) (HCC)    Palpitations    Past Surgical History:  Procedure Laterality Date   APPENDECTOMY     CHOLECYSTECTOMY     COLONOSCOPY     Dr. Michail Sermon   COLONOSCOPY WITH PROPOFOL N/A 11/06/2018   Procedure: COLONOSCOPY WITH PROPOFOL;  Surgeon: Wilford Corner, MD;  Location: [facility];  Service: Endoscopy;  Laterality: N/A;   HERNIA REPAIR     LEG AMPUTATION Left [id number]   Dr. Kendell Bane    POLYPECTOMY  11/06/2018   Procedure: POLYPECTOMY;  Surgeon: Wilford Corner, MD;  Location: [facility];  Service: Endoscopy;;   SHOULDER SURGERY  [id number]   Dr. Meridee Score   TOTAL ANKLE ARTHROPLASTY  [id number]    Current Outpatient Medications  Medication Sig Dispense Refill   amiodarone (PACERONE) 200 MG tablet Take 1 tablet (200 mg total) by mouth 2 (two) times daily. 180 tablet 1   amitriptyline (ELAVIL) 50 MG tablet TAKE 1 TABLET BY MOUTH EVERYDAY AT BEDTIME 90 tablet 1   apixaban (ELIQUIS) 5 MG TABS tablet Take 1 tablet (5 mg total) by mouth 2 (two) times daily. 180 tablet 1   atorvastatin (LIPITOR) 40 MG tablet Take 1 tablet (40 mg total) by mouth daily. 90 tablet 1   b complex vitamins tablet Take 1 tablet by mouth daily.     Ensure (ENSURE) Take 1 Can by mouth daily.     folic acid (FOLVITE) 1 MG tablet TAKE 1 TABLET BY MOUTH EVERY DAY 90 tablet 1   gabapentin (NEURONTIN) 600 MG tablet Take 1 tablet (600 mg total) by mouth 3 (three) times daily. 270 tablet 1   ketoconazole (NIZORAL) 2 % cream Apply 1 fingertip amount to each foot daily. 30 g 0   Menthol 5 % PTCH Apply 1 patch topically as needed (back pain).     metFORMIN (GLUCOPHAGE-XR) 500  MG 24 hr tablet TAKE TWO TABLETS BY MOUTH DAILY WITH SUPPER 180 tablet 2   metoprolol succinate (TOPROL-XL) 50 MG 24 hr tablet TAKE 1 TABLET BY MOUTH DAILY WITH SUPPER. TAKE WITH OR IMMEDIATELY FOLLOWING A MEAL. 90 tablet 1   omeprazole (PRILOSEC) 20 MG capsule TAKE 1 CAPSULE BY MOUTH EVERY DAY 90 capsule 1   ONETOUCH VERIO test strip USE TO TEST BLOOD SUGAR TWICE DAILY DX: E11.42 200 strip 4   ramipril (ALTACE) 1.25 MG capsule TAKE 1 CAPSULE BY MOUTH EVERY DAY 90 capsule 1   SHINGRIX injection      topiramate (TOPAMAX) 50 MG tablet TAKE 1 AND 1/2 TABLETS BY MOUTH DAILY 135 tablet 0   vitamin B-12 (CYANOCOBALAMIN) 1000 MCG tablet Take 1,000 mcg by mouth daily.     vitamin C (ASCORBIC ACID) 500 MG tablet Take 500 mg by mouth daily.       No current facility-administered medications for this encounter.    No Known Allergies  Social History   Socioeconomic History   Marital status: Married    Spouse name: Not on file   Number of children: 4   Years of education: Not on file   Highest education level: Not on file  Occupational History   Occupation: retired Administrator  Tobacco Use   Smoking status: Former    Years: 12.00    Types: Cigarettes    Quit date: 10/10/1981    Years since quitting: 40.6   Smokeless tobacco: Never  Vaping Use   Vaping Use: Never used  Substance and Sexual Activity   Alcohol use: Yes    Comment: has glass of wine once a week but states rarely    Drug use: No   Sexual activity: Never  Other Topics Concern   Not on file  Social History Narrative   Diet:   Do you drink/eat things with caffeine?  Yes   Marital status:  Divorced.  What year were you married?  2001   Do you live in a house, assisted living, condo, apartment, trailer, etc.?  Apartment   Is it one or more stories?  1 story   How many persons live in your home?  1   Do you have any pets in your home?  No   Current or past profession:  Truck driver   Do you exercise?  Some  Type and how often:  Walking   Social Determinants of Health   Financial Resource Strain: Low Risk  (01/24/2018)   Overall Financial Resource Strain (CARDIA)    Difficulty of Paying Living Expenses: Not hard at all  Food Insecurity: No Food Insecurity (01/24/2018)   Hunger Vital Sign    Worried About Running Out of Food in the Last Year: Never true    Ran Out of Food in the Last Year: Never true  Transportation Needs: No Transportation Needs (01/24/2018)   PRAPARE - Hydrologist (Medical): No    Lack of Transportation (Non-Medical): No  Physical Activity: Inactive (01/24/2018)   Exercise Vital Sign    Days of Exercise per Week: 0 days    Minutes of Exercise per Session: 0 min  Stress: Stress Concern Present  (01/24/2018)   [place]    Feeling of Stress : To some extent  Social Connections: Moderately Isolated (01/24/2018)   Social Connection and Isolation Panel [NHANES]    Frequency of Communication with Friends and Family: Once a  week    Frequency of Social Gatherings with Friends and Family: Once a week    Attends Religious Services: More than 4 times per year    Active Member of [employer] or Organizations: No    Attends Archivist Meetings: Never    Marital Status: Divorced  Human resources officer Violence: Not At Risk (01/24/2018)   Humiliation, Afraid, Rape, and Kick questionnaire    Fear of Current or Ex-Partner: No    Emotionally Abused: No    Physically Abused: No    Sexually Abused: No    Family History  Problem Relation Age of Onset   Heart disease Mother        Living   Stroke Father 106       Deceased   Diabetes Sister    Cancer Sister     ROS- All systems are reviewed and negative except as per the HPI above  Physical Exam: Vitals:   05/26/22 1112  Height: '5\' 10"'$  (1.778 m)   Wt Readings from Last 3 Encounters:  05/10/22 89.8 kg  04/18/22 99.8 kg  03/24/21 100.4 kg    Labs: Lab Results  Component Value Date   NA 136 04/19/2022   K 3.8 04/19/2022   CL 104 04/19/2022   CO2 27 04/19/2022   GLUCOSE 90 04/19/2022   BUN 4 (L) 04/19/2022   CREATININE 0.56 (L) 04/19/2022   CALCIUM 8.2 (L) 04/19/2022   PHOS 2.8 06/19/2011   MG 1.9 06/20/2011   Lab Results  Component Value Date   INR 1.53 (H) 04/17/2016   Lab Results  Component Value Date   CHOL 106 04/19/2022   HDL 47 04/19/2022   LDLCALC 45 04/19/2022   TRIG 56 04/19/2022     GEN- The patient is well appearing, alert and oriented x 3 today.   Head- normocephalic, atraumatic Eyes-  Sclera clear, conjunctiva pink Ears- hearing intact Oropharynx- clear Neck- supple, no JVP Lymph- no cervical lymphadenopathy Lungs- Clear to  ausculation bilaterally, normal work of breathing Heart- irregular rate and rhythm, no murmurs, rubs or gallops, PMI not laterally displaced GI- soft, NT, ND, + BS Extremities- no clubbing, cyanosis, or edema MS- no significant deformity or atrophy Skin- no rash or lesion Psych- euthymic mood, full affect Neuro- strength and sensation are intact  EKG-Vent. rate 102 BPM PR interval * ms QRS duration 70 ms QT/QTcB 372/484 ms P-R-T axes * -36 29 Atrial fibrillation with rapid ventricular response Left axis deviation Low voltage QRS Inferior infarct , age undetermined Cannot rule out Anterior infarct , age undetermined Abnormal ECG When compared with ECG of 17-Apr-2016 15:23, PREVIOUS ECG IS PRESENT    Assessment and Plan:  1. Persistent afib with RVR Pt has proven to be noncompliant with meds Recruited ex wife to help pre pour and supervise this  I will see back in 2 weeks and if he has been taking amiodarone  200 mg bid and eliquis 5 mg bid without missed doses then cardioversion will be scheduled They both understand that cardioversion( and ultimately shoulder surgery ) is pivotal on correct administration of drugs   Butch Penny C. Kesia Dalto, [facility] [address] [place], [place] [address] [phone]

## 2022-05-27 ENCOUNTER — Encounter: Payer: Self-pay | Admitting: Family

## 2022-05-27 ENCOUNTER — Ambulatory Visit: Payer: Medicare Other | Admitting: Podiatry

## 2022-05-27 ENCOUNTER — Ambulatory Visit (INDEPENDENT_AMBULATORY_CARE_PROVIDER_SITE_OTHER): Payer: Medicare Other | Admitting: Family

## 2022-05-27 VITALS — BP 110/86 | HR 80 | Temp 96.4°F | Resp 16 | Ht 70.0 in

## 2022-05-27 DIAGNOSIS — T24211A Burn of second degree of right thigh, initial encounter: Secondary | ICD-10-CM | POA: Diagnosis not present

## 2022-05-27 DIAGNOSIS — T23201A Burn of second degree of right hand, unspecified site, initial encounter: Secondary | ICD-10-CM | POA: Diagnosis not present

## 2022-05-27 MED ORDER — ACETAMINOPHEN 500 MG PO TABS: 500.0000 mg | ORAL_TABLET | Freq: Four times a day (QID) | ORAL | 0 refills | Status: AC | PRN

## 2022-05-27 NOTE — Progress Notes (Signed)
Provider: Marlowe Sax FNP-C  Ronel Rodeheaver, Nelda Bucks, NP  Patient Care Team: Keirsten Matuska, Nelda Bucks, NP as PCP - General (Family Medicine) Clent Jacks, MD as Consulting Physician (Ophthalmology) Alda Berthold, DO as Consulting Physician (Neurology)  Extended Emergency Contact Information Primary Emergency Contact: Waggle,Anna Address: [address]          [place], [place] [address] Johnnette Litter of [place] Phone: [phone] Relation: Spouse Secondary Emergency Contact: Thomasenia Bottoms [place] of [place] Phone: [phone] Relation: Sister  Code Status: Full Code  Goals of care: Advanced Directive information    05/27/2022    1:31 PM  Advanced Directives  Does Patient Have a Medical Advance Directive? Yes  Type of Paramedic of [place];Living will  Does patient want to make changes to medical advance directive? No - Patient declined  Copy of [place] in Chart? Yes - validated most recent copy scanned in chart (See row information)     Chief Complaint  Patient presents with   Acute Visit    Patient complains of burning right arm/thigh.    HPI:  Pt is a 76 y.o. male seen today for an acute visit for evaluation of burn on right arm and right thigh area x 3 days ago.  He is here with ex-wife who provides additional HPI information.  Stated patient was making his breakfast in the morning when he dropped bacon on hold all which splashed on his right wrist and right thigh area. Ex-wife usually comes to assist with ADLs and she found patient.  She has been cleaning wounds and applying over-the-counter burn gel which has been effective for pain.  Patient denies any pain on the burned area.  Also denies any fever or chills or drainage from burned area.  Ex-wife states patient still in the process of avoiding placement of SNF due to his restrictive insurance.  Social worker was ordered 05/03/2022 to assist with SNF  placement.   Past Medical History:  Diagnosis Date   Chest pain    Diabetes mellitus    DM2 (diabetes mellitus, type 2) (HCC)    GERD (gastroesophageal reflux disease)    HLD (hyperlipidemia)    HTN (hypertension)    PAF (paroxysmal atrial fibrillation) (HCC)    Palpitations    Past Surgical History:  Procedure Laterality Date   APPENDECTOMY     CHOLECYSTECTOMY     COLONOSCOPY     Dr. Michail Sermon   COLONOSCOPY WITH PROPOFOL N/A 11/06/2018   Procedure: COLONOSCOPY WITH PROPOFOL;  Surgeon: Wilford Corner, MD;  Location: [facility];  Service: Endoscopy;  Laterality: N/A;   HERNIA REPAIR     LEG AMPUTATION Left [id number]   Dr. Kendell Bane   POLYPECTOMY  11/06/2018   Procedure: POLYPECTOMY;  Surgeon: Wilford Corner, MD;  Location: [facility];  Service: Endoscopy;;   SHOULDER SURGERY  [id number]   Dr. Meridee Score   TOTAL ANKLE ARTHROPLASTY  [id number]    No Known Allergies  Outpatient Encounter Medications as of 05/27/2022  Medication Sig   amiodarone (PACERONE) 200 MG tablet Take 1 tablet (200 mg total) by mouth 2 (two) times daily.   amitriptyline (ELAVIL) 50 MG tablet TAKE 1 TABLET BY MOUTH EVERYDAY AT BEDTIME   apixaban (ELIQUIS) 5 MG TABS tablet Take 1 tablet (5 mg total) by mouth 2 (two) times daily.   atorvastatin (LIPITOR) 40 MG tablet Take 1 tablet (40 mg total) by mouth daily.   b complex vitamins tablet Take 1  tablet by mouth daily.   Ensure (ENSURE) Take 1 Can by mouth daily.   folic acid (FOLVITE) 1 MG tablet TAKE 1 TABLET BY MOUTH EVERY DAY   gabapentin (NEURONTIN) 600 MG tablet Take 1 tablet (600 mg total) by mouth 3 (three) times daily.   ketoconazole (NIZORAL) 2 % cream Apply 1 fingertip amount to each foot daily.   Menthol 5 % PTCH Apply 1 patch topically as needed (back pain).   metFORMIN (GLUCOPHAGE-XR) 500 MG 24 hr tablet TAKE TWO TABLETS BY MOUTH DAILY WITH SUPPER   metoprolol succinate (TOPROL-XL) 50 MG 24 hr tablet TAKE 1 TABLET BY MOUTH DAILY WITH  SUPPER. TAKE WITH OR IMMEDIATELY FOLLOWING A MEAL.   omeprazole (PRILOSEC) 20 MG capsule TAKE 1 CAPSULE BY MOUTH EVERY DAY   ONETOUCH VERIO test strip USE TO TEST BLOOD SUGAR TWICE DAILY DX: E11.42   ramipril (ALTACE) 1.25 MG capsule TAKE 1 CAPSULE BY MOUTH EVERY DAY   topiramate (TOPAMAX) 50 MG tablet TAKE 1 AND 1/2 TABLETS BY MOUTH DAILY   vitamin B-12 (CYANOCOBALAMIN) 1000 MCG tablet Take 1,000 mcg by mouth daily.   vitamin C (ASCORBIC ACID) 500 MG tablet Take 500 mg by mouth daily.    No facility-administered encounter medications on file as of 05/27/2022.    Review of Systems  Constitutional:  Negative for chills and fever.  Skin:  Positive for wound. Negative for color change, pallor and rash.       Burn areas on right arm and right thigh  Neurological:  Negative for dizziness, weakness and headaches.  Psychiatric/Behavioral:  Negative for agitation, confusion, self-injury, sleep disturbance and suicidal ideas. The patient is not nervous/anxious.     Immunization History  Administered Date(s) Administered   DTaP 10/10/2014   Fluad Quad(high Dose 65+) 08/28/2019, 08/07/2020   Influenza,inj,Quad PF,6+ Mos 08/05/2016   Influenza-Unspecified 10/10/2014   PFIZER(Purple Top)SARS-COV-2 Vaccination 01/28/2020, 02/18/2020   Pneumococcal Conjugate-13 11/11/2016   Pneumococcal Polysaccharide-23 04/18/2022   Pneumococcal-Unspecified 10/10/2014   Tdap 12/01/2020   Zoster Recombinat (Shingrix) 03/09/2021   Pertinent  Health Maintenance Due  Topic Date Due   FOOT EXAM  05/19/2021   INFLUENZA VACCINE  05/10/2022   OPHTHALMOLOGY EXAM  06/29/2022   HEMOGLOBIN A1C  10/20/2022   COLONOSCOPY (Pts 45-51yr Insurance coverage will need to be confirmed)  Discontinued      11/26/2020   10:14 AM 12/14/2020    2:53 PM 03/24/2021   10:08 AM 04/18/2022   11:44 AM 05/27/2022    1:31 PM  Fall Risk  Falls in the past year? 0 0 0 0 0  Was there an injury with Fall? 0 0 0 0 0  Fall Risk Category  Calculator 0 0 0 0 0  Fall Risk Category Low Low Low Low Low  Patient Fall Risk Level Low fall risk Low fall risk Low fall risk Low fall risk Low fall risk  Patient at Risk for Falls Due to    No Fall Risks No Fall Risks  Fall risk Follow up    Falls evaluation completed Falls evaluation completed   Functional Status Survey:    Vitals:   05/27/22 1328  BP: 110/86  Pulse: 80  Resp: 16  Temp: (!) 96.4 F (35.8 C)  SpO2: 96%  Height: '5\' 10"'$  (1.778 m)   Body mass index is 28.96 kg/m. Physical Exam Vitals reviewed.  Constitutional:      General: He is not in acute distress.    Appearance: Normal appearance. He is  normal weight. He is not ill-appearing or diaphoretic.  HENT:     Head: Normocephalic.  Eyes:     General: No scleral icterus.       Right eye: No discharge.        Left eye: No discharge.     Conjunctiva/sclera: Conjunctivae normal.     Pupils: Pupils are equal, round, and reactive to light.  Cardiovascular:     Rate and Rhythm: Normal rate and regular rhythm.     Heart sounds: Normal heart sounds. No murmur heard.    No friction rub. No gallop.  Pulmonary:     Effort: Pulmonary effort is normal. No respiratory distress.     Breath sounds: Normal breath sounds. No wheezing, rhonchi or rales.  Chest:     Chest wall: No tenderness.  Abdominal:     General: Bowel sounds are normal. There is no distension.     Palpations: Abdomen is soft. There is no mass.     Tenderness: There is no abdominal tenderness. There is no right CVA tenderness, left CVA tenderness, guarding or rebound.  Skin:    General: Skin is warm and dry.     Coloration: Skin is not pale.     Findings: No bruising, erythema, lesion or rash.     Comments: Right wrist- forearm and right inner thigh area partial thickness burn wound red without any drainage.surround areas without any drainage noted.Wound bed cleansed with wound cleanser,pat dry,TAO applied covered with non-adhesive gauze and wrapped  with Kerlix.Foam dressing applied to right thigh area for extra protection and absorption.patient tolerated procedure well.  Neurological:     Mental Status: He is alert. Mental status is at baseline.     Motor: No weakness.     Gait: Gait abnormal.  Psychiatric:        Mood and Affect: Mood normal.        Speech: Speech normal.        Behavior: Behavior normal.     Labs reviewed: Recent Labs    04/19/22 1039  NA 136  K 3.8  CL 104  CO2 27  GLUCOSE 90  BUN 4*  CREATININE 0.56*  CALCIUM 8.2*   Recent Labs    04/19/22 1039  AST 15  ALT 11  BILITOT 1.5*  PROT 5.8*   Recent Labs    04/19/22 1039  WBC 5.5  NEUTROABS 3,834  HGB 12.8*  HCT 37.4*  MCV 100.3*  PLT 91*   Lab Results  Component Value Date   TSH 2.30 04/19/2022   Lab Results  Component Value Date   HGBA1C 4.8 04/19/2022   Lab Results  Component Value Date   CHOL 106 04/19/2022   HDL 47 04/19/2022   LDLCALC 45 04/19/2022   TRIG 56 04/19/2022   CHOLHDL 2.3 04/19/2022    Significant Diagnostic Results in last 30 days:  CT SHOULDER RIGHT WO CONTRAST  Result Date: 05/06/2022 CLINICAL DATA:  Chronic right shoulder pain EXAM: CT OF THE UPPER RIGHT EXTREMITY WITHOUT CONTRAST TECHNIQUE: Multidetector CT imaging of the upper right extremity was performed according to the standard protocol. RADIATION DOSE REDUCTION: This exam was performed according to the departmental dose-optimization program which includes automated exposure control, adjustment of the mA and/or kV according to patient size and/or use of iterative reconstruction technique. COMPARISON:  X-ray 03/09/2022 FINDINGS: Bones/Joint/Cartilage No acute fracture. No dislocation. Moderate-to-severe glenohumeral joint osteoarthritis manifested by joint space narrowing and marginal osteophyte formation. Humeral head is high-riding relative to the  glenoid. No large glenohumeral joint effusion is evident. Moderate degenerative changes of the  acromioclavicular joint. No large subacromial-subdeltoid bursal fluid collection. Remaining visualized osseous structures appear grossly intact. Ligaments Suboptimally assessed by CT. Muscles and Tendons Presumed chronically torn rotator cuff with associated rotator cuff muscle atrophy. Soft tissues No soft tissue fluid collection or hematoma. No axillary lymphadenopathy. Moderate-large layering right-sided pleural effusion. Mild emphysematous changes within the right lung. Thoracic aortic atherosclerosis. IMPRESSION: 1. Advanced right glenohumeral joint osteoarthritis. 2. Moderate-large layering right-sided pleural effusion. Aortic Atherosclerosis (ICD10-I70.0). Electronically Signed   By: Davina Poke D.O.   On: 05/06/2022 16:35    Assessment/Plan 1. Partial thickness burn of multiple sites of right hand, initial encounter Afebrile Right wrist- forearm and right inner thigh area partial thickness burn wound red without any drainage.surround areas without any drainage noted.Wound bed cleansed with wound cleanser,pat dry,TAO applied covered with non-adhesive gauze and wrapped with Kerlix.patient tolerated procedure well. -Home health nurse recommended for wound care with patient and ex-wife states will continue with current management of wound. -Burn wound clean no antibiotics indicated -Tdap up-to-date -Follow-up in 2 weeks for reevaluation and referred to wound center if indicated - acetaminophen (TYLENOL) 500 MG tablet; Take 1 tablet (500 mg total) by mouth every 6 (six) hours as needed for moderate pain.  Dispense: 30 tablet; Refill: 0 -Advised to notify provider for any erythema, drainage with strong odor  2. Partial thickness burn of right thigh, initial encounter Right thigh wound cleansed with wound cleanser, pat dry,TAO applied and foam dressing applied for extra protection and absorption.patient tolerated procedure well. - acetaminophen (TYLENOL) 500 MG tablet; Take 1 tablet (500 mg  total) by mouth every 6 (six) hours as needed for moderate pain.  Dispense: 30 tablet; Refill: 0 Afebrile -Advised to notify provider for any erythema, drainage with strong odor  Family/ staff Communication: Reviewed plan of care with patient and ex-wife verbalized understanding  Labs/tests ordered: None   Next Appointment: Return in about 1 week (around 06/03/2022) for right hand and right thigh wound .   Spent 30 minutes with patient  >50% time spent counseling; reviewing medical record; labs;cleaning and applying new wound dressing on right arm and thigh area and developing future plan of care.  Sandrea Hughs, NP

## 2022-05-27 NOTE — Patient Instructions (Addendum)
Cleanse right hand and right thigh area wound  with saline ,pat dry, triple antibiotic ointment or burn gel cover with non-adhesive gauze for protection and absorption and wrap with Kerlix.may cover right thigh wound with foam dressing.change dressing daily.  Soak dressing with saline prior to removing the dressing.   - Notify provider for any drainage,strong odor or redness   - take Extra strength tylenol [phone] mg tablet 30 minutes prior to changing dressing

## 2022-06-02 ENCOUNTER — Encounter (HOSPITAL_COMMUNITY): Admission: RE | Payer: Self-pay | Source: Home / Self Care

## 2022-06-02 ENCOUNTER — Ambulatory Visit (HOSPITAL_COMMUNITY): Admission: RE | Admit: 2022-06-02 | Payer: Medicare Other | Source: Home / Self Care | Admitting: Orthopedic Surgery

## 2022-06-02 DIAGNOSIS — Z01818 Encounter for other preprocedural examination: Secondary | ICD-10-CM

## 2022-06-02 SURGERY — ARTHROPLASTY, SHOULDER, TOTAL, REVERSE
Anesthesia: General | Site: Shoulder | Laterality: Right

## 2022-06-03 ENCOUNTER — Encounter (HOSPITAL_COMMUNITY): Payer: Self-pay | Admitting: Emergency Medicine

## 2022-06-03 ENCOUNTER — Observation Stay (HOSPITAL_COMMUNITY): Payer: Medicare Other

## 2022-06-03 ENCOUNTER — Ambulatory Visit: Payer: Medicare Other | Admitting: Family

## 2022-06-03 ENCOUNTER — Inpatient Hospital Stay (HOSPITAL_COMMUNITY)
Admission: EM | Admit: 2022-06-03 | Discharge: 2022-06-10 | DRG: 917 | Disposition: A | Discharge status: Skilled Nursing Facility | Payer: Medicare Other | Attending: Internal Medicine | Admitting: Internal Medicine

## 2022-06-03 ENCOUNTER — Other Ambulatory Visit: Payer: Self-pay

## 2022-06-03 DIAGNOSIS — Z87891 Personal history of nicotine dependence: Secondary | ICD-10-CM | POA: Diagnosis not present

## 2022-06-03 DIAGNOSIS — K219 Gastro-esophageal reflux disease without esophagitis: Secondary | ICD-10-CM | POA: Diagnosis not present

## 2022-06-03 DIAGNOSIS — Z9049 Acquired absence of other specified parts of digestive tract: Secondary | ICD-10-CM

## 2022-06-03 DIAGNOSIS — L089 Local infection of the skin and subcutaneous tissue, unspecified: Secondary | ICD-10-CM | POA: Diagnosis present

## 2022-06-03 DIAGNOSIS — R410 Disorientation, unspecified: Secondary | ICD-10-CM | POA: Diagnosis not present

## 2022-06-03 DIAGNOSIS — J9811 Atelectasis: Secondary | ICD-10-CM | POA: Diagnosis not present

## 2022-06-03 DIAGNOSIS — G9340 Encephalopathy, unspecified: Secondary | ICD-10-CM | POA: Diagnosis not present

## 2022-06-03 DIAGNOSIS — Z833 Family history of diabetes mellitus: Secondary | ICD-10-CM

## 2022-06-03 DIAGNOSIS — Z743 Need for continuous supervision: Secondary | ICD-10-CM | POA: Diagnosis not present

## 2022-06-03 DIAGNOSIS — Z96669 Presence of unspecified artificial ankle joint: Secondary | ICD-10-CM | POA: Diagnosis present

## 2022-06-03 DIAGNOSIS — I10 Essential (primary) hypertension: Secondary | ICD-10-CM | POA: Diagnosis not present

## 2022-06-03 DIAGNOSIS — E11649 Type 2 diabetes mellitus with hypoglycemia without coma: Secondary | ICD-10-CM | POA: Diagnosis not present

## 2022-06-03 DIAGNOSIS — I952 Hypotension due to drugs: Secondary | ICD-10-CM | POA: Diagnosis not present

## 2022-06-03 DIAGNOSIS — I480 Paroxysmal atrial fibrillation: Secondary | ICD-10-CM

## 2022-06-03 DIAGNOSIS — Z89512 Acquired absence of left leg below knee: Secondary | ICD-10-CM | POA: Diagnosis not present

## 2022-06-03 DIAGNOSIS — T887XXA Unspecified adverse effect of drug or medicament, initial encounter: Secondary | ICD-10-CM | POA: Diagnosis not present

## 2022-06-03 DIAGNOSIS — E1140 Type 2 diabetes mellitus with diabetic neuropathy, unspecified: Secondary | ICD-10-CM | POA: Diagnosis present

## 2022-06-03 DIAGNOSIS — T50911A Poisoning by multiple unspecified drugs, medicaments and biological substances, accidental (unintentional), initial encounter: Secondary | ICD-10-CM | POA: Diagnosis not present

## 2022-06-03 DIAGNOSIS — R278 Other lack of coordination: Secondary | ICD-10-CM | POA: Diagnosis not present

## 2022-06-03 DIAGNOSIS — Z7901 Long term (current) use of anticoagulants: Secondary | ICD-10-CM | POA: Diagnosis not present

## 2022-06-03 DIAGNOSIS — Z7984 Long term (current) use of oral hypoglycemic drugs: Secondary | ICD-10-CM

## 2022-06-03 DIAGNOSIS — T50904A Poisoning by unspecified drugs, medicaments and biological substances, undetermined, initial encounter: Principal | ICD-10-CM

## 2022-06-03 DIAGNOSIS — M6281 Muscle weakness (generalized): Secondary | ICD-10-CM | POA: Diagnosis not present

## 2022-06-03 DIAGNOSIS — T2200XA Burn of unspecified degree of shoulder and upper limb, except wrist and hand, unspecified site, initial encounter: Secondary | ICD-10-CM | POA: Diagnosis not present

## 2022-06-03 DIAGNOSIS — E0781 Sick-euthyroid syndrome: Secondary | ICD-10-CM | POA: Diagnosis present

## 2022-06-03 DIAGNOSIS — M2550 Pain in unspecified joint: Secondary | ICD-10-CM | POA: Diagnosis not present

## 2022-06-03 DIAGNOSIS — T50901A Poisoning by unspecified drugs, medicaments and biological substances, accidental (unintentional), initial encounter: Secondary | ICD-10-CM | POA: Diagnosis present

## 2022-06-03 DIAGNOSIS — Z79899 Other long term (current) drug therapy: Secondary | ICD-10-CM | POA: Diagnosis not present

## 2022-06-03 DIAGNOSIS — Z8249 Family history of ischemic heart disease and other diseases of the circulatory system: Secondary | ICD-10-CM | POA: Diagnosis not present

## 2022-06-03 DIAGNOSIS — G929 Unspecified toxic encephalopathy: Secondary | ICD-10-CM | POA: Diagnosis present

## 2022-06-03 DIAGNOSIS — I499 Cardiac arrhythmia, unspecified: Secondary | ICD-10-CM | POA: Diagnosis not present

## 2022-06-03 DIAGNOSIS — I482 Chronic atrial fibrillation, unspecified: Secondary | ICD-10-CM | POA: Diagnosis present

## 2022-06-03 DIAGNOSIS — R4781 Slurred speech: Secondary | ICD-10-CM | POA: Diagnosis not present

## 2022-06-03 DIAGNOSIS — T24011A Burn of unspecified degree of right thigh, initial encounter: Secondary | ICD-10-CM | POA: Diagnosis not present

## 2022-06-03 DIAGNOSIS — D689 Coagulation defect, unspecified: Secondary | ICD-10-CM | POA: Diagnosis present

## 2022-06-03 DIAGNOSIS — G928 Other toxic encephalopathy: Secondary | ICD-10-CM | POA: Diagnosis not present

## 2022-06-03 DIAGNOSIS — E785 Hyperlipidemia, unspecified: Secondary | ICD-10-CM | POA: Diagnosis not present

## 2022-06-03 DIAGNOSIS — J90 Pleural effusion, not elsewhere classified: Secondary | ICD-10-CM | POA: Diagnosis not present

## 2022-06-03 DIAGNOSIS — R339 Retention of urine, unspecified: Secondary | ICD-10-CM | POA: Diagnosis present

## 2022-06-03 DIAGNOSIS — Z7401 Bed confinement status: Secondary | ICD-10-CM | POA: Diagnosis not present

## 2022-06-03 DIAGNOSIS — E1142 Type 2 diabetes mellitus with diabetic polyneuropathy: Secondary | ICD-10-CM

## 2022-06-03 LAB — URINALYSIS, ROUTINE W REFLEX MICROSCOPIC
Bilirubin Urine: NEGATIVE
Glucose, UA: NEGATIVE mg/dL
Hgb urine dipstick: NEGATIVE
Ketones, ur: NEGATIVE mg/dL
Leukocytes,Ua: NEGATIVE
Nitrite: NEGATIVE
Protein, ur: NEGATIVE mg/dL
Specific Gravity, Urine: 1.015 (ref 1.005–1.030)
pH: 5 (ref 5.0–8.0)

## 2022-06-03 LAB — COMPREHENSIVE METABOLIC PANEL
ALT: 22 U/L (ref 0–44)
AST: 29 U/L (ref 15–41)
Albumin: 2.2 g/dL — ABNORMAL LOW (ref 3.5–5.0)
Alkaline Phosphatase: 79 U/L (ref 38–126)
Anion gap: 7 (ref 5–15)
BUN: 6 mg/dL — ABNORMAL LOW (ref 8–23)
CO2: 21 mmol/L — ABNORMAL LOW (ref 22–32)
Calcium: 8.2 mg/dL — ABNORMAL LOW (ref 8.9–10.3)
Chloride: 111 mmol/L (ref 98–111)
Creatinine, Ser: 0.72 mg/dL (ref 0.61–1.24)
GFR, Estimated: >60 mL/min (ref >60)
Glucose, Bld: 91 mg/dL (ref 70–99)
Potassium: 3.7 mmol/L (ref 3.5–5.1)
Sodium: 139 mmol/L (ref 135–145)
Total Bilirubin: 1 mg/dL (ref 0.3–1.2)
Total Protein: 5.8 g/dL — ABNORMAL LOW (ref 6.5–8.1)

## 2022-06-03 LAB — SALICYLATE LEVEL: Salicylate Lvl: <7 mg/dL — ABNORMAL LOW (ref 7.0–30.0)

## 2022-06-03 LAB — CBC WITH DIFFERENTIAL/PLATELET
Abs Immature Granulocytes: 0.01 10*3/uL (ref 0.00–0.07)
Basophils Absolute: 0 10*3/uL (ref 0.0–0.1)
Basophils Relative: 0 %
Eosinophils Absolute: 0.1 10*3/uL (ref 0.0–0.5)
Eosinophils Relative: 2 %
HCT: 33.8 % — ABNORMAL LOW (ref 39.0–52.0)
Hemoglobin: 11.1 g/dL — ABNORMAL LOW (ref 13.0–17.0)
Immature Granulocytes: 0 %
Lymphocytes Relative: 36 %
Lymphs Abs: 1.4 10*3/uL (ref 0.7–4.0)
MCH: 34.7 pg — ABNORMAL HIGH (ref 26.0–34.0)
MCHC: 32.8 g/dL (ref 30.0–36.0)
MCV: 105.6 fL — ABNORMAL HIGH (ref 80.0–100.0)
Monocytes Absolute: 0.4 10*3/uL (ref 0.1–1.0)
Monocytes Relative: 10 %
Neutro Abs: 2 10*3/uL (ref 1.7–7.7)
Neutrophils Relative %: 52 %
Platelets: 104 10*3/uL — ABNORMAL LOW (ref 150–400)
RBC: 3.2 MIL/uL — ABNORMAL LOW (ref 4.22–5.81)
RDW: 14.5 % (ref 11.5–15.5)
WBC: 3.9 10*3/uL — ABNORMAL LOW (ref 4.0–10.5)
nRBC: 0.5 % — ABNORMAL HIGH (ref 0.0–0.2)

## 2022-06-03 LAB — ETHANOL: Alcohol, Ethyl (B): <10 mg/dL (ref <10)

## 2022-06-03 LAB — ACETAMINOPHEN LEVEL: Acetaminophen (Tylenol), Serum: <10 ug/mL — ABNORMAL LOW (ref 10–30)

## 2022-06-03 LAB — PROTIME-INR
INR: 1.3 — ABNORMAL HIGH (ref 0.8–1.2)
Prothrombin Time: 15.9 seconds — ABNORMAL HIGH (ref 11.4–15.2)

## 2022-06-03 LAB — MAGNESIUM: Magnesium: 1.8 mg/dL (ref 1.7–2.4)

## 2022-06-03 MED ORDER — SODIUM CHLORIDE 0.9 % IV BOLUS
1000.0000 mL | Freq: Once | INTRAVENOUS | Status: AC
  Administered 2022-06-03: 1000 mL via INTRAVENOUS

## 2022-06-03 MED ORDER — HYDRALAZINE HCL 20 MG/ML IJ SOLN
5.0000 mg | Freq: Four times a day (QID) | INTRAMUSCULAR | Status: DC | PRN
Start: 2022-06-03 — End: 2022-06-10

## 2022-06-03 MED ORDER — SODIUM BICARBONATE 8.4 % IV SOLN
INTRAVENOUS | Status: DC
  Filled 2022-06-03 (×2): qty 1000

## 2022-06-03 MED ORDER — SODIUM CHLORIDE 0.9 % IV SOLN
INTRAVENOUS | Status: DC
Start: 2022-06-03 — End: 2022-06-03

## 2022-06-03 MED ORDER — INSULIN ASPART 100 UNIT/ML IJ SOLN
0.0000 [IU] | Freq: Three times a day (TID) | INTRAMUSCULAR | Status: DC
  Administered 2022-06-05 – 2022-06-07 (×6): 1 [IU] via SUBCUTANEOUS

## 2022-06-03 NOTE — ED Notes (Signed)
Secure message sent to receiving RN Paulette Blanch , awaiting response. Pt has an inpatient bed at this time

## 2022-06-03 NOTE — ED Notes (Signed)
Pt has an inpatient assigned bed, waiting for RN to be assigned to pt so report can be handed off

## 2022-06-03 NOTE — ED Provider Notes (Signed)
[facility] EMERGENCY DEPARTMENT Provider Note   CSN: [id number] Arrival date & time: 06/03/22  1409     History  Chief Complaint  Patient presents with   Drug Overdose    Darrell Horne is a 76 y.o. male.   Drug Overdose  Patient brought in for accidental medication overdose.  Reportedly took by report 2 weeks of his medications.  Wife is later here and provides more history.  Patient appears somewhat confused and cannot provide much history.  Reportedly has his 1 week of medicine all together.  There are 3 areas for each day.  Took at least all the pills for the evening and afternoon.  Also took some of the days.  Thinks he just got confused.    Past Medical History:  Diagnosis Date   Chest pain    Diabetes mellitus    DM2 (diabetes mellitus, type 2) (HCC)    GERD (gastroesophageal reflux disease)    HLD (hyperlipidemia)    HTN (hypertension)    PAF (paroxysmal atrial fibrillation) (HCC)    Palpitations     Home Medications Prior to Admission medications   Medication Sig Start Date End Date Taking? Authorizing Provider  acetaminophen (TYLENOL) 500 MG tablet Take 1 tablet (500 mg total) by mouth every 6 (six) hours as needed for moderate pain. 05/27/22   Ngetich, Dinah C, NP  amiodarone (PACERONE) 200 MG tablet Take 1 tablet (200 mg total) by mouth 2 (two) times daily. 05/10/22   Jerline Pain, MD  amitriptyline (ELAVIL) 50 MG tablet TAKE 1 TABLET BY MOUTH EVERYDAY AT BEDTIME 02/11/21   Ngetich, Dinah C, NP  apixaban (ELIQUIS) 5 MG TABS tablet Take 1 tablet (5 mg total) by mouth 2 (two) times daily. 05/20/21   Ngetich, Dinah C, NP  atorvastatin (LIPITOR) 40 MG tablet Take 1 tablet (40 mg total) by mouth daily. 08/07/20   Ngetich, Dinah C, NP  b complex vitamins tablet Take 1 tablet by mouth daily.    [provider]  Ensure (ENSURE) Take 1 Can by mouth daily.    [provider]  folic acid (FOLVITE) 1 MG tablet TAKE 1 TABLET BY MOUTH EVERY  DAY 06/03/21   Ngetich, Dinah C, NP  gabapentin (NEURONTIN) 600 MG tablet Take 1 tablet (600 mg total) by mouth 3 (three) times daily. 08/07/20   Ngetich, Dinah C, NP  ketoconazole (NIZORAL) 2 % cream Apply 1 fingertip amount to each foot daily. 05/21/21   Evelina Bucy, DPM  Menthol 5 % PTCH Apply 1 patch topically as needed (back pain).    [provider]  metFORMIN (GLUCOPHAGE-XR) 500 MG 24 hr tablet TAKE TWO TABLETS BY MOUTH DAILY WITH SUPPER 11/18/21   Ngetich, Dinah C, NP  metoprolol succinate (TOPROL-XL) 50 MG 24 hr tablet TAKE 1 TABLET BY MOUTH DAILY WITH SUPPER. TAKE WITH OR IMMEDIATELY FOLLOWING A MEAL. 03/22/21   Ngetich, Dinah C, NP  omeprazole (PRILOSEC) 20 MG capsule TAKE 1 CAPSULE BY MOUTH EVERY DAY 01/28/21   Ngetich, Nelda Bucks, NP  ONETOUCH VERIO test strip USE TO TEST BLOOD SUGAR TWICE DAILY DX: E11.42 04/24/19   Ngetich, Dinah C, NP  ramipril (ALTACE) 1.25 MG capsule TAKE 1 CAPSULE BY MOUTH EVERY DAY 05/20/21   Ngetich, Dinah C, NP  topiramate (TOPAMAX) 50 MG tablet TAKE 1 AND 1/2 TABLETS BY MOUTH DAILY 02/26/21   Ngetich, Dinah C, NP  vitamin B-12 (CYANOCOBALAMIN) 1000 MCG tablet Take 1,000 mcg by mouth daily.  [provider]  vitamin C (ASCORBIC ACID) 500 MG tablet Take 500 mg by mouth daily.     [provider]      Allergies    Patient has no known allergies.    Review of Systems   Review of Systems  Physical Exam Updated Vital Signs BP 94/71   Pulse 60   Temp (!) 97.4 F (36.3 C) (Oral)   Resp 14   Ht '5\' 10"'$  (1.778 m)   Wt 91.5 kg   SpO2 94%   BMI 28.94 kg/m  Physical Exam Vitals reviewed.  HENT:     Head: Normocephalic.  Cardiovascular:     Rate and Rhythm: Bradycardia present. Rhythm irregular.  Pulmonary:     Breath sounds: No wheezing or rhonchi.  Abdominal:     Tenderness: There is no abdominal tenderness.  Skin:    Capillary Refill: Capillary refill takes less than 2 seconds.  Neurological:     Mental Status: He is  alert.     Comments: Awake and somewhat slow to answer.  Mild confusion.   Right hand wrapped at burn site.  ED Results / Procedures / Treatments   Labs (all labs ordered are listed, but only abnormal results are displayed) Labs Reviewed  ACETAMINOPHEN LEVEL - Abnormal; Notable for the following components:      Result Value   Acetaminophen (Tylenol), Serum <10 (*)    All other components within normal limits  CBC WITH DIFFERENTIAL/PLATELET - Abnormal; Notable for the following components:   WBC 3.9 (*)    RBC 3.20 (*)    Hemoglobin 11.1 (*)    HCT 33.8 (*)    MCV 105.6 (*)    MCH 34.7 (*)    Platelets 104 (*)    nRBC 0.5 (*)    All other components within normal limits  SALICYLATE LEVEL - Abnormal; Notable for the following components:   Salicylate Lvl <7.2 (*)    All other components within normal limits  ETHANOL  URINALYSIS, ROUTINE W REFLEX MICROSCOPIC  COMPREHENSIVE METABOLIC PANEL  MAGNESIUM    EKG EKG Interpretation  Date/Time:  Friday June 03 2022 14:22:41 EDT Ventricular Rate:  81 PR Interval:    QRS Duration: 94 QT Interval:  431 QTC Calculation: 498 R Axis:   -14 Text Interpretation: Atrial fibrillation Ventricular premature complex Low voltage, extremity and precordial leads Borderline prolonged QT interval rate now slow Confirmed by Davonna Belling [phone]) on 06/03/2022 2:32:26 PM  Radiology No results found.  Procedures Procedures    Medications Ordered in ED Medications  sodium chloride 0.9 % bolus 1,000 mL (1,000 mLs Intravenous New Bag/Given 06/03/22 1455)    ED Course/ Medical Decision Making/ A&P                           Medical Decision Making Amount and/or Complexity of Data Reviewed Labs: ordered.   Patient with accidental overdose of his medicines.  Sounds like he took most of a weeks worth of medicine.  Discussed with poison control.  Reviewed recent notes of this medication.  Patient likely could have sedation mental status  changes.  Potentially some arrhythmias.  Potentially seizures.  Fluid boluses if needed.  Keep potassium and magnesium upper limits of normal.  Watch for QRS prolongation.  If prolongs above 120 may need bicarb boluses.  Potentially could need pressors if becomes hypotensive.  Initial lab work reassuring but CMP pending.  Has had some mild bradycardia  which could be suspected with the metoprolol.  However appears somewhat stable.  Will require likely admission to the hospital.  Care turned over to Dr.Naasz.        Final Clinical Impression(s) / ED Diagnoses Final diagnoses:  Overdose of undetermined intent, initial encounter    Rx / DC Orders ED Discharge Orders     None         Davonna Belling, MD 06/03/22 [phone]

## 2022-06-03 NOTE — ED Notes (Signed)
Pt eating meal at this time from [place], pt will try to urinate after dinner, wife order decaf tea to help stimulate bladder.

## 2022-06-03 NOTE — ED Notes (Signed)
Secure message sent to Dr. Roosevelt Locks regarding pt has not urinated since arrival, still has a UA pending from earlier, requesting if provider would like a bladder scan? Awaiting response.

## 2022-06-03 NOTE — ED Notes (Signed)
Pt to CT at this time.

## 2022-06-03 NOTE — ED Notes (Signed)
Patty from [employer] reached out to get details on patient accidental overdose.  Phone [phone].

## 2022-06-03 NOTE — ED Notes (Signed)
Verbal order to bladder scan pt per Dr. Roosevelt Locks
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
## 2022-06-03 NOTE — ED Notes (Signed)
Wife at bedside and she states it was not two weeks worth of medicine, just 5 days.  MD Mayra Neer aware.  No new orders at this time.

## 2022-06-03 NOTE — ED Notes (Signed)
Several warm blankets applied on pt for warmth
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
## 2022-06-03 NOTE — ED Provider Notes (Signed)
  Care was assumed from off going ED attending. For more details, please see note from same day.  Briefly, h/o Afib, T2DM, HTN, h/o L BKA, neuropathy, . Took all evening/afternoon and maybe some day pills. Unintentional of up to a week's worth of medicines. Including eliquis, amiodarone, amitriptyline, metoprolol, gabapentin, metformin. Pulse down in 50s at times but in 80s now. BP 90s-110s.    Physical Exam  BP 97/70   Pulse 80   Temp 97.8 F (36.6 C) (Oral)   Resp 16   Ht '5\' 10"'$  (1.778 m)   Wt 91.5 kg   SpO2 91%   BMI 28.94 kg/m    Procedures  Procedures  ED Course / MDM   Clinical Course as of 06/03/22 1846  Fri Jun 03, 2022  1811 Called to discuss with poison control. Recommending 12 hours of observation. Will consult to hospitalist. Patient no longer hypotensive or bradycardic at this time.  [HN]    Clinical Course User Index [HN] Audley Hose, MD   Medical Decision Making Amount and/or Complexity of Data Reviewed Labs: ordered.   Dispo: admit  Audley Hose, MD        Audley Hose, MD 06/03/22 [phone]

## 2022-06-03 NOTE — ED Notes (Signed)
Receiving RN Paulette Blanch has agreed to accept [facility] once pt has arrived to inpatient unit, all questions and concerns address. Pt remains on cardiac monitoring during transport.

## 2022-06-04 ENCOUNTER — Encounter (HOSPITAL_COMMUNITY): Payer: Self-pay | Admitting: Internal Medicine

## 2022-06-04 ENCOUNTER — Observation Stay (HOSPITAL_COMMUNITY): Payer: Medicare Other

## 2022-06-04 DIAGNOSIS — E11649 Type 2 diabetes mellitus with hypoglycemia without coma: Secondary | ICD-10-CM | POA: Diagnosis not present

## 2022-06-04 DIAGNOSIS — Z89512 Acquired absence of left leg below knee: Secondary | ICD-10-CM | POA: Diagnosis not present

## 2022-06-04 DIAGNOSIS — I480 Paroxysmal atrial fibrillation: Secondary | ICD-10-CM | POA: Diagnosis not present

## 2022-06-04 DIAGNOSIS — I952 Hypotension due to drugs: Secondary | ICD-10-CM

## 2022-06-04 DIAGNOSIS — E0781 Sick-euthyroid syndrome: Secondary | ICD-10-CM | POA: Diagnosis not present

## 2022-06-04 DIAGNOSIS — E785 Hyperlipidemia, unspecified: Secondary | ICD-10-CM | POA: Diagnosis present

## 2022-06-04 DIAGNOSIS — Z9049 Acquired absence of other specified parts of digestive tract: Secondary | ICD-10-CM | POA: Diagnosis not present

## 2022-06-04 DIAGNOSIS — I10 Essential (primary) hypertension: Secondary | ICD-10-CM | POA: Diagnosis present

## 2022-06-04 DIAGNOSIS — Z87891 Personal history of nicotine dependence: Secondary | ICD-10-CM | POA: Diagnosis not present

## 2022-06-04 DIAGNOSIS — D689 Coagulation defect, unspecified: Secondary | ICD-10-CM | POA: Diagnosis not present

## 2022-06-04 DIAGNOSIS — Z7984 Long term (current) use of oral hypoglycemic drugs: Secondary | ICD-10-CM | POA: Diagnosis not present

## 2022-06-04 DIAGNOSIS — E1140 Type 2 diabetes mellitus with diabetic neuropathy, unspecified: Secondary | ICD-10-CM | POA: Diagnosis not present

## 2022-06-04 DIAGNOSIS — Z833 Family history of diabetes mellitus: Secondary | ICD-10-CM | POA: Diagnosis not present

## 2022-06-04 DIAGNOSIS — T50904A Poisoning by unspecified drugs, medicaments and biological substances, undetermined, initial encounter: Secondary | ICD-10-CM | POA: Diagnosis not present

## 2022-06-04 DIAGNOSIS — Z79899 Other long term (current) drug therapy: Secondary | ICD-10-CM | POA: Diagnosis not present

## 2022-06-04 DIAGNOSIS — R339 Retention of urine, unspecified: Secondary | ICD-10-CM | POA: Diagnosis not present

## 2022-06-04 DIAGNOSIS — Z7901 Long term (current) use of anticoagulants: Secondary | ICD-10-CM | POA: Diagnosis not present

## 2022-06-04 DIAGNOSIS — Z8249 Family history of ischemic heart disease and other diseases of the circulatory system: Secondary | ICD-10-CM | POA: Diagnosis not present

## 2022-06-04 DIAGNOSIS — G9340 Encephalopathy, unspecified: Secondary | ICD-10-CM

## 2022-06-04 DIAGNOSIS — J90 Pleural effusion, not elsewhere classified: Secondary | ICD-10-CM | POA: Diagnosis not present

## 2022-06-04 DIAGNOSIS — T2200XA Burn of unspecified degree of shoulder and upper limb, except wrist and hand, unspecified site, initial encounter: Secondary | ICD-10-CM | POA: Diagnosis not present

## 2022-06-04 DIAGNOSIS — T24011A Burn of unspecified degree of right thigh, initial encounter: Secondary | ICD-10-CM | POA: Diagnosis not present

## 2022-06-04 DIAGNOSIS — I482 Chronic atrial fibrillation, unspecified: Secondary | ICD-10-CM | POA: Diagnosis not present

## 2022-06-04 DIAGNOSIS — K219 Gastro-esophageal reflux disease without esophagitis: Secondary | ICD-10-CM | POA: Diagnosis present

## 2022-06-04 DIAGNOSIS — J9811 Atelectasis: Secondary | ICD-10-CM | POA: Diagnosis not present

## 2022-06-04 DIAGNOSIS — L089 Local infection of the skin and subcutaneous tissue, unspecified: Secondary | ICD-10-CM | POA: Diagnosis present

## 2022-06-04 DIAGNOSIS — Z96669 Presence of unspecified artificial ankle joint: Secondary | ICD-10-CM | POA: Diagnosis present

## 2022-06-04 DIAGNOSIS — G929 Unspecified toxic encephalopathy: Secondary | ICD-10-CM | POA: Diagnosis not present

## 2022-06-04 DIAGNOSIS — T50901A Poisoning by unspecified drugs, medicaments and biological substances, accidental (unintentional), initial encounter: Secondary | ICD-10-CM | POA: Diagnosis present

## 2022-06-04 DIAGNOSIS — T50911A Poisoning by multiple unspecified drugs, medicaments and biological substances, accidental (unintentional), initial encounter: Secondary | ICD-10-CM | POA: Diagnosis not present

## 2022-06-04 LAB — CBC
HCT: 32.9 % — ABNORMAL LOW (ref 39.0–52.0)
Hemoglobin: 11.1 g/dL — ABNORMAL LOW (ref 13.0–17.0)
MCH: 34.3 pg — ABNORMAL HIGH (ref 26.0–34.0)
MCHC: 33.7 g/dL (ref 30.0–36.0)
MCV: 101.5 fL — ABNORMAL HIGH (ref 80.0–100.0)
Platelets: 107 10*3/uL — ABNORMAL LOW (ref 150–400)
RBC: 3.24 MIL/uL — ABNORMAL LOW (ref 4.22–5.81)
RDW: 14.1 % (ref 11.5–15.5)
WBC: 3.4 10*3/uL — ABNORMAL LOW (ref 4.0–10.5)
nRBC: 0 % (ref 0.0–0.2)

## 2022-06-04 LAB — URINALYSIS, ROUTINE W REFLEX MICROSCOPIC
Bilirubin Urine: NEGATIVE
Glucose, UA: NEGATIVE mg/dL
Hgb urine dipstick: NEGATIVE
Ketones, ur: NEGATIVE mg/dL
Leukocytes,Ua: NEGATIVE
Nitrite: NEGATIVE
Protein, ur: NEGATIVE mg/dL
Specific Gravity, Urine: 1.008 (ref 1.005–1.030)
pH: 6 (ref 5.0–8.0)

## 2022-06-04 LAB — GLUCOSE, CAPILLARY
Glucose-Capillary: 104 mg/dL — ABNORMAL HIGH (ref 70–99)
Glucose-Capillary: 98 mg/dL (ref 70–99)
Glucose-Capillary: 103 mg/dL — ABNORMAL HIGH (ref 70–99)
Glucose-Capillary: 117 mg/dL — ABNORMAL HIGH (ref 70–99)

## 2022-06-04 LAB — COMPREHENSIVE METABOLIC PANEL
ALT: 19 U/L (ref 0–44)
AST: 26 U/L (ref 15–41)
Albumin: 1.9 g/dL — ABNORMAL LOW (ref 3.5–5.0)
Alkaline Phosphatase: 70 U/L (ref 38–126)
Anion gap: 4 — ABNORMAL LOW (ref 5–15)
BUN: 6 mg/dL — ABNORMAL LOW (ref 8–23)
CO2: 23 mmol/L (ref 22–32)
Calcium: 8 mg/dL — ABNORMAL LOW (ref 8.9–10.3)
Chloride: 111 mmol/L (ref 98–111)
Creatinine, Ser: 0.61 mg/dL (ref 0.61–1.24)
GFR, Estimated: >60 mL/min (ref >60)
Glucose, Bld: 115 mg/dL — ABNORMAL HIGH (ref 70–99)
Potassium: 3.3 mmol/L — ABNORMAL LOW (ref 3.5–5.1)
Sodium: 138 mmol/L (ref 135–145)
Total Bilirubin: 0.6 mg/dL (ref 0.3–1.2)
Total Protein: 5 g/dL — ABNORMAL LOW (ref 6.5–8.1)

## 2022-06-04 LAB — CK: Total CK: 59 U/L (ref 49–397)

## 2022-06-04 LAB — VITAMIN B12: Vitamin B-12: 610 pg/mL (ref 180–914)

## 2022-06-04 LAB — MAGNESIUM: Magnesium: 1.7 mg/dL (ref 1.7–2.4)

## 2022-06-04 LAB — TSH: TSH: 5.467 u[IU]/mL — ABNORMAL HIGH (ref 0.350–4.500)

## 2022-06-04 MED ORDER — ACETAMINOPHEN 325 MG PO TABS
650.0000 mg | ORAL_TABLET | ORAL | Status: DC | PRN
  Administered 2022-06-05 – 2022-06-10 (×5): 650 mg via ORAL
  Filled 2022-06-04 (×5): qty 2

## 2022-06-04 MED ORDER — KCL IN DEXTROSE-NACL 10-5-0.45 MEQ/L-%-% IV SOLN: INTRAVENOUS | Status: DC

## 2022-06-04 MED ORDER — THIAMINE HCL 100 MG/ML IJ SOLN
500.0000 mg | Freq: Once | INTRAVENOUS | Status: AC
  Administered 2022-06-04: 500 mg via INTRAVENOUS
  Filled 2022-06-04: qty 5

## 2022-06-04 MED ORDER — THIAMINE HCL 100 MG/ML IJ SOLN
100.0000 mg | INTRAMUSCULAR | Status: DC
  Administered 2022-06-05 – 2022-06-07 (×3): 100 mg via INTRAVENOUS
  Filled 2022-06-04 (×3): qty 2

## 2022-06-04 MED ORDER — MAGNESIUM SULFATE 2 GM/50ML IV SOLN
2.0000 g | Freq: Once | INTRAVENOUS | Status: AC
  Administered 2022-06-04: 2 g via INTRAVENOUS
  Filled 2022-06-04: qty 50

## 2022-06-04 MED ORDER — POTASSIUM CHLORIDE 10 MEQ/100ML IV SOLN
10.0000 meq | INTRAVENOUS | Status: AC
  Administered 2022-06-04 (×4): 10 meq via INTRAVENOUS
  Filled 2022-06-04 (×4): qty 100

## 2022-06-04 MED ORDER — KCL IN DEXTROSE-NACL 10-5-0.45 MEQ/L-%-% IV SOLN
INTRAVENOUS | Status: DC
  Filled 2022-06-04 (×6): qty 1000
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
## 2022-06-04 NOTE — TOC Initial Note (Addendum)
Transition of Care [facility]) - Initial/Assessment Note    Patient Details  Name: Darrell Horne MRN: [id number] Date of Birth: [birth year]  Transition of [facility]) CM/SW Contact:    [address], [place] Phone Number: 06/04/2022, 12:29 PM  Clinical Narrative:                  CSW received consult for possible SNF placement at time of discharge. CSW spoke with patient's spouse.Patient's spouse is currently unable to care for patient at their home given patient's current physical needs and fall risk and expressed understanding of PT recommendation and is agreeable to SNF placement at time of discharge. Patient's spouse reports preference for any facility in [place] . Spouse is wanting assistance with long term care placement, CSW did advise that we are unable to assist, states she understands. Spouse states pt does not have Medicaid or long term care insurance. MD and spouse agree that pt would benefit from short term rehab. CSW discussed insurance authorization process and will provide Medicare SNF ratings list. CSW will send out referrals for review and provide bed offers as available.   Skilled Nursing Rehab Facilities-   [email or web address]   Ratings out of 5 stars (the highest)   Name Address  Phone # [place] Overall  [facility] [address], [place] '5 5 2 4  '$ [facility]  [address], [place] [phone] '4 2 5 5  '$ [facility] [place], [place] '1 1 1 1  '$ [facility] [place], [place] '2 2 4 4  '$ [facility] [address] [place] '1 1 2 1  '$ [facility] [place] '3 2 4 4  '$ [facility] [address], [place] '5 1 3 3  '$ [facility] [address] [place] '5 2 3 4  '$ [address] ([place]) [place], [place]  [phone] '4 1 2 1  '$ [facility] [address], [place] [phone] '4 1 1 1  '$ [facility] [address], [facility] [phone] '3 1 2 1  '$ [facility] ([place]) [place]. [place], [place] [phone] '4 1 1 1  '$ [facility] [address] [phone] '4 2 4 4          '$ [place]      [facility] [place] '4 1 3 2  '$ [facility] [address] [place] '3 1 5 4  '$ [place], [place] [phone] '1 1 1 1  '$ [facility] [address], [employer] [phone] '2 2 3 3          '$ [address] (no [facility]) [place] [address], [place] [phone] '5 5 5 5  '$ [facility] (No Humana) [address], [place] '4 1 4 3  '$ [facility] (No UHC) [place], [place] '5 5 5 5  '$ [facility] [address], [employer] [phone] '2 3 5 5  '$ [place] [address], [place] '1 1 2 1  '$ [facility] [address], [place] [phone] '3 1 1 1  '$ [place], [place] '5 2 4 5  '$ [facility]  [address], [place] '2 2 1 1  '$ [facility] [address], [place] '3 2 1 '$ 1  [facility] [place], [place] '2 2 2 2          '$ [facility] [address], [place] [phone] '1 1 1 1  '$ [facility] [address], [place]  [phone] '2 4 2 2  '$ [facility] [place] [address], [place] [phone] '4 2 3 3  '$ [place] [address], [place] '2 1 1 1  '$ [place] (No Humana) [address] [place] [phone] '2 2 3 3  '$ [facility] [address], [place] [phone] '2 1 1 1          '$ [facility] [place], [place] '5 4 5 5  '$ [facility])  [address], [place] '2 1 2 1  '$ [facility]) [place] [address] [place] '3 1 4 3  '$ [facility] [address] [place] '3 3 4 4  '$ [address] [place], [place] '2 3 1 1  '$ [facility]) [address] [place] [phone] '2 1 4 3           '$ Patient Goals and CMS Choice        Expected Discharge Plan and Services                                                Prior Living Arrangements/Services                       Activities of Daily Living Home Assistive Devices/Equipment: Wheelchair, Prosthesis ADL Screening (condition at time of admission) Patient's cognitive ability adequate to safely complete daily activities?: No Is the patient deaf or have difficulty hearing?: Yes Does the patient have difficulty seeing, even when wearing glasses/contacts?: No Does the patient have difficulty concentrating, remembering, or making decisions?: Yes Patient able to express need for assistance with ADLs?: Yes Does the patient have difficulty dressing or bathing?: Yes Independently performs ADLs?: No Communication: Needs assistance Is this a change from baseline?: Pre-admission baseline Dressing (OT): Needs assistance Is this a change from baseline?: Pre-admission baseline Grooming: Needs assistance Is this a change from baseline?: Pre-admission baseline Feeding: Independent Bathing: Needs assistance Is this a change from baseline?: Pre-admission baseline Toileting: Needs assistance Is this a change from baseline?: Pre-admission baseline In/Out Bed: Needs assistance Is this a change from baseline?: Pre-admission baseline Walks in Home: Needs assistance Is this a change from baseline?: Pre-admission baseline Does the patient have difficulty walking or climbing stairs?: Yes (left BKA) Weakness of Legs: None Weakness of Arms/Hands: None  Permission  Sought/Granted                  Emotional Assessment              Admission diagnosis:  Overdose [T50.901A] Overdose of undetermined intent, initial encounter [T50.904A] Patient Active Problem List   Diagnosis Date Noted   Overdose by ingestion 06/03/2022   Overdose 06/03/2022   Exudative age-related macular degeneration of left eye with active choroidal neovascularization ([place]) 12/16/2020   Diabetes mellitus without complication ([place]) 93/81/0175   Early stage nonexudative age-related macular degeneration of right eye 12/16/2020   Former smoker 12/14/2020   Positive hepatitis C antibody test 10/08/2019   Personal history of colonic polyps 11/06/2018  DDD (degenerative disc disease), lumbar 08/17/2018   DDD (degenerative disc disease), cervical 08/17/2018   Hx of BKA, left ([place]) 11/11/2016   High risk medication use 11/11/2016   Syncope 04/17/2016   Diabetic polyneuropathy associated with type 2 diabetes mellitus ([place]) 40/81/4481   Alcoholic peripheral neuropathy ([place]) 12/23/2015   Sensory ataxia 12/23/2015   Headache 11/04/2015   Chronic low back pain 11/04/2015   Neuropathy due to medical condition ([place]) 04/08/2015   Diabetes ([place]) 04/09/2014   PAF (paroxysmal atrial fibrillation) ([place])    DM2 (diabetes mellitus, type 2) ([place])    HTN (hypertension)    Palpitations    HLD (hyperlipidemia)    PCP:  Sandrea Hughs, NP Pharmacy:   [id number] [id number] [place] [id number]: [phone]: [phone]    Social Determinants of Health (SDOH) Interventions    Readmission Risk Interventions     No data to display

## 2022-06-05 DIAGNOSIS — T50901A Poisoning by unspecified drugs, medicaments and biological substances, accidental (unintentional), initial encounter: Secondary | ICD-10-CM | POA: Diagnosis not present

## 2022-06-05 DIAGNOSIS — I480 Paroxysmal atrial fibrillation: Secondary | ICD-10-CM | POA: Diagnosis not present

## 2022-06-05 LAB — BASIC METABOLIC PANEL
Anion gap: 6 (ref 5–15)
BUN: 5 mg/dL — ABNORMAL LOW (ref 8–23)
CO2: 24 mmol/L (ref 22–32)
Calcium: 8 mg/dL — ABNORMAL LOW (ref 8.9–10.3)
Chloride: 109 mmol/L (ref 98–111)
Creatinine, Ser: 0.62 mg/dL (ref 0.61–1.24)
GFR, Estimated: >60 mL/min (ref >60)
Glucose, Bld: 101 mg/dL — ABNORMAL HIGH (ref 70–99)
Potassium: 4 mmol/L (ref 3.5–5.1)
Sodium: 139 mmol/L (ref 135–145)

## 2022-06-05 LAB — CBC
HCT: 35.9 % — ABNORMAL LOW (ref 39.0–52.0)
Hemoglobin: 12 g/dL — ABNORMAL LOW (ref 13.0–17.0)
MCH: 34.3 pg — ABNORMAL HIGH (ref 26.0–34.0)
MCHC: 33.4 g/dL (ref 30.0–36.0)
MCV: 102.6 fL — ABNORMAL HIGH (ref 80.0–100.0)
Platelets: 115 10*3/uL — ABNORMAL LOW (ref 150–400)
RBC: 3.5 MIL/uL — ABNORMAL LOW (ref 4.22–5.81)
RDW: 14.5 % (ref 11.5–15.5)
WBC: 3.8 10*3/uL — ABNORMAL LOW (ref 4.0–10.5)
nRBC: 0 % (ref 0.0–0.2)

## 2022-06-05 LAB — GLUCOSE, CAPILLARY
Glucose-Capillary: 106 mg/dL — ABNORMAL HIGH (ref 70–99)
Glucose-Capillary: 126 mg/dL — ABNORMAL HIGH (ref 70–99)
Glucose-Capillary: 132 mg/dL — ABNORMAL HIGH (ref 70–99)
Glucose-Capillary: 96 mg/dL (ref 70–99)
Glucose-Capillary: 97 mg/dL (ref 70–99)

## 2022-06-05 LAB — MAGNESIUM: Magnesium: 2.1 mg/dL (ref 1.7–2.4)

## 2022-06-05 MED ORDER — DILTIAZEM LOAD VIA INFUSION
10.0000 mg | Freq: Once | INTRAVENOUS | Status: AC
  Administered 2022-06-05: 10 mg via INTRAVENOUS
  Filled 2022-06-05: qty 10

## 2022-06-05 MED ORDER — DILTIAZEM HCL-DEXTROSE 125-5 MG/125ML-% IV SOLN (PREMIX)
5.0000 mg/h | INTRAVENOUS | Status: DC
  Administered 2022-06-05 – 2022-06-07 (×3): 5 mg/h via INTRAVENOUS
  Filled 2022-06-05 (×4): qty 125

## 2022-06-05 NOTE — Progress Notes (Signed)
PROGRESS NOTE    Darrell Horne  ZDG:[id number] DOB: 1946/05/04 DOA: 06/03/2022 PCP: Sandrea Hughs, NP   Chief Complaint  Patient presents with   Drug Overdose    Brief Narrative:   Darrell Horne is a 76 y.o. male with medical history significant of chronic A-fib on Eliquis, IIDM, HTN, HLD, peripheral neuropathy, right foot infection status post left BKA, presented with multiple medication overdose.   Patient uses a pillbox with 7 separated double slots for easy administration.  Wife reported that patient has had frequent episodes of confusion and has missed several times for his medications lately.  Still, patient lives by himself and recently patient also became very forgetful and has burned himself while cooking several times.  At the time I saw the patient, patient is rather confused, not remember clearly what has happened.  But what he remembered is that he took his pills from the pill box two times today at around 7 AM and 11 AM.  Wife reported that she last patient normal yesterday evening, when she checked patient's medication box, there was still 5 days worth left.  This morning around noon time, wife went to patient's home and found patient very confused.  And 5 days worth of a.m. medications and 4 days worth of p.o. medications were missing.  Patient however has not denied any lightheadedness headache vision problems palpitations or chest pains.  He further denies any suicidal ideation or planning.   Patient has chronic A-fib, recently became uncontrolled.  Cardiology started patient on amiodarone 200 mg twice daily about 3 weeks ago.   ED Course: Episodes of bradycardia heart rate as low as 50s, and in A-fib.  EKG showed A-fib and no PR or QTc interval changes, no widened QRS either.  [place] contacted and recommended observation for 24 hours.   Assessment & Plan:   Multiple medication overdose -Most concerning is Eliquis, given the risk of bleeding.  Monitor CBC  closely .  CBC remained stable. -He initially lethargic, now is more awake, but mentation not back to baseline, so well continue to hold Elavil, Topamax and gabapentin  -Holding amiodarone and Toprol-XL given soft blood pressure and mild bradycardia, now rate started to increase, please see discussion below. -Hold all antihypertensive medications, continue with IV fluids given soft blood pressure -Continue to hold atorvastatin, monitor LFTs and total CK closely, so far reassuring  Acute toxic encephalopathy -Polypharmacy/drug toxicity from overdosing -CT head with no acute findings  -Patient gradually improving, he is more awake today and communicative, started on diet yesterday   Question of early dementia -Question of whether still appropriate patient lives by himself, consult case management for long-term solution, at least will need supervised living. -TSH mildly elevated, likely sick euthyroid syndrome - B12 is stable at [id number] on folic acid     Coagulopathy, acute, secondary to overdose Eliquis -No acute bleeding,  ill start on [place] heparin for DVT prophylaxis and back on Eliquis in 1 -2 days   A-fib with  -Chronic, r initially rate controlled, but this morning he is tachycardic with soft blood pressure, so I will start on Cardizem drip, will hold on amiodarone  given possible amiodarone overdose on presentation, but if blood pressure remains soft then will start on amiodarone drip instead of Cardizem.  .   HTN>> hypertension -Hold off ramipril and metoprolol -Pressure is soft currently, continue with IV fluids   IIDM -Hold off metformin -Continue to monitor CBG closely   Diabetic  neuropathy -Hold off gabapentin today, consider restart gabapentin small dose to avoid withdrawal  Urinary retention -bladder scan frequently and in and out as needed      DVT prophylaxis: SCD, [place] heparin Code Status: Full Family Communication: none at bedside Disposition:   Status is:  inpatinet   Consultants:  none  Subjective:  Patient is more awake and appropriate and communicative today, eating breakfast.  Objective: Vitals:   06/05/22 1310 06/05/22 1330 06/05/22 1400 06/05/22 1430  BP:  (!) 97/59 111/78 113/77  Pulse: 88 87 (!) 106 96  Resp: '16 18 19 '$ (!) 21  Temp:      TempSrc:      SpO2: 100% 100% 98% 94%  Weight:      Height:        Intake/Output Summary (Last 24 hours) at 06/05/2022 1508 Last data filed at 06/05/2022 1145 Gross per 24 hour  Intake 360 ml  Output 1000 ml  Net -640 ml   Filed Weights   06/03/22 1419  Weight: 91.5 kg    Examination:  Awake, communicative, impaired cognition and insight, frail, deconditioned, Symmetrical Chest wall movement, Good air movement bilaterally Irregular irregular, tachycardic Abdomen soft No Cyanosis, left AKA     Data Reviewed: I have personally reviewed following labs and imaging studies  CBC: Recent Labs  Lab 06/03/22 1505 06/04/22 0644 06/05/22 0314  WBC 3.9* 3.4* 3.8*  NEUTROABS 2.0  --   --   HGB 11.1* 11.1* 12.0*  HCT 33.8* 32.9* 35.9*  MCV 105.6* 101.5* 102.6*  PLT 104* 107* 115*    Basic Metabolic Panel: Recent Labs  Lab 06/03/22 1641 06/04/22 0644 06/05/22 0314  NA 139 138 139  K 3.7 3.3* 4.0  CL 111 111 109  CO2 21* 23 24  GLUCOSE 91 115* 101*  BUN 6* 6* 5*  CREATININE 0.72 0.61 0.62  CALCIUM 8.2* 8.0* 8.0*  MG 1.8 1.7 2.1    GFR: Estimated Creatinine Clearance: 89.3 mL/min (by C-G formula based on SCr of 0.62 mg/dL).  Liver Function Tests: Recent Labs  Lab 06/03/22 1641 06/04/22 0644  AST 29 26  ALT 22 19  ALKPHOS 79 70  BILITOT 1.0 0.6  PROT 5.8* 5.0*  ALBUMIN 2.2* 1.9*    CBG: Recent Labs  Lab 06/04/22 1620 06/04/22 2012 06/05/22 0001 06/05/22 0432 06/05/22 1118  GLUCAP 103* 117* 97 96 132*     No results found for this or any previous visit (from the past 240 hour(s)).       Radiology Studies: DG Chest 1 View  Result Date:  06/04/2022 CLINICAL DATA:  Overdose EXAM: CHEST  1 VIEW COMPARISON:  April 17, 2016 FINDINGS: Bilateral pleural effusions with underlying atelectasis are identified. The heart, hila, and mediastinum are normal. No pneumothorax. No other acute abnormalities. IMPRESSION: New bilateral pleural effusions with underlying opacities, likely atelectasis. No other acute abnormalities. Electronically Signed   By: Dorise Bullion III M.D.   On: 06/04/2022 08:26   CT HEAD WO CONTRAST (5MM)  Result Date: 06/03/2022 CLINICAL DATA:  Altered mental status EXAM: CT HEAD WITHOUT CONTRAST TECHNIQUE: Contiguous axial images were obtained from the base of the skull through the vertex without intravenous contrast. RADIATION DOSE REDUCTION: This exam was performed according to the departmental dose-optimization program which includes automated exposure control, adjustment of the mA and/or kV according to patient size and/or use of iterative reconstruction technique. COMPARISON:  04/17/2016 FINDINGS: Brain: No evidence of acute infarction, hemorrhage, hydrocephalus, extra-axial collection or mass lesion/mass  effect. Chronic atrophic changes are seen. Vascular: No hyperdense vessel or unexpected calcification. Skull: Normal. Negative for fracture or focal lesion. Sinuses/Orbits: No acute finding. Other: None. IMPRESSION: Chronic atrophic changes without acute abnormality. Electronically Signed   By: Inez Catalina M.D.   On: 06/03/2022 21:05        Scheduled Meds:  insulin aspart  0-9 Units Subcutaneous TID WC   thiamine (VITAMIN B1) injection  100 mg Intravenous Q24H   Continuous Infusions:  dextrose 5 % and 0.45 % NaCl with KCl 10 mEq/L 75 mL/hr at 06/05/22 0509   diltiazem (CARDIZEM) infusion 5 mg/hr (06/05/22 1140)     LOS: 1 day       Phillips Climes, MD [facility]   To contact the attending provider between 7A-7P or the covering provider during after hours 7P-7A, please log into the web site  [email or web address] and access using universal [place] password for that web site. If you do not have the password, please call the hospital operator.  06/05/2022, 3:08 PM
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
## 2022-06-06 DIAGNOSIS — T50901A Poisoning by unspecified drugs, medicaments and biological substances, accidental (unintentional), initial encounter: Secondary | ICD-10-CM | POA: Diagnosis not present

## 2022-06-06 DIAGNOSIS — I480 Paroxysmal atrial fibrillation: Secondary | ICD-10-CM | POA: Diagnosis not present

## 2022-06-06 LAB — BASIC METABOLIC PANEL
Anion gap: 6 (ref 5–15)
BUN: 5 mg/dL — ABNORMAL LOW (ref 8–23)
CO2: 23 mmol/L (ref 22–32)
Calcium: 8.2 mg/dL — ABNORMAL LOW (ref 8.9–10.3)
Chloride: 106 mmol/L (ref 98–111)
Creatinine, Ser: 0.51 mg/dL — ABNORMAL LOW (ref 0.61–1.24)
GFR, Estimated: >60 mL/min (ref >60)
Glucose, Bld: 134 mg/dL — ABNORMAL HIGH (ref 70–99)
Potassium: 3.9 mmol/L (ref 3.5–5.1)
Sodium: 135 mmol/L (ref 135–145)

## 2022-06-06 LAB — CBC
HCT: 37.7 % — ABNORMAL LOW (ref 39.0–52.0)
Hemoglobin: 12.5 g/dL — ABNORMAL LOW (ref 13.0–17.0)
MCH: 34.2 pg — ABNORMAL HIGH (ref 26.0–34.0)
MCHC: 33.2 g/dL (ref 30.0–36.0)
MCV: 103 fL — ABNORMAL HIGH (ref 80.0–100.0)
Platelets: 138 10*3/uL — ABNORMAL LOW (ref 150–400)
RBC: 3.66 MIL/uL — ABNORMAL LOW (ref 4.22–5.81)
RDW: 14.5 % (ref 11.5–15.5)
WBC: 5.9 10*3/uL (ref 4.0–10.5)
nRBC: 0 % (ref 0.0–0.2)

## 2022-06-06 LAB — GLUCOSE, CAPILLARY
Glucose-Capillary: 100 mg/dL — ABNORMAL HIGH (ref 70–99)
Glucose-Capillary: 112 mg/dL — ABNORMAL HIGH (ref 70–99)
Glucose-Capillary: 122 mg/dL — ABNORMAL HIGH (ref 70–99)
Glucose-Capillary: 123 mg/dL — ABNORMAL HIGH (ref 70–99)

## 2022-06-06 LAB — PHOSPHORUS: Phosphorus: 2.6 mg/dL (ref 2.5–4.6)

## 2022-06-06 LAB — MAGNESIUM: Magnesium: 1.8 mg/dL (ref 1.7–2.4)

## 2022-06-06 LAB — FOLATE: Folate: 8 ng/mL (ref >5.9)

## 2022-06-06 MED ORDER — APIXABAN 5 MG PO TABS
5.0000 mg | ORAL_TABLET | Freq: Two times a day (BID) | ORAL | Status: DC
  Administered 2022-06-06 – 2022-06-10 (×8): 5 mg via ORAL
  Filled 2022-06-06 (×8): qty 1

## 2022-06-06 MED ORDER — METOPROLOL SUCCINATE ER 25 MG PO TB24
12.5000 mg | ORAL_TABLET | Freq: Every day | ORAL | Status: DC
  Administered 2022-06-06 – 2022-06-07 (×2): 12.5 mg via ORAL
  Filled 2022-06-06 (×2): qty 1

## 2022-06-06 MED ORDER — ONDANSETRON HCL 4 MG/2ML IJ SOLN
4.0000 mg | Freq: Four times a day (QID) | INTRAMUSCULAR | Status: DC | PRN
  Administered 2022-06-06 – 2022-06-07 (×3): 4 mg via INTRAVENOUS
  Filled 2022-06-06 (×3): qty 2

## 2022-06-06 MED ORDER — AMIODARONE HCL 200 MG PO TABS
200.0000 mg | ORAL_TABLET | Freq: Two times a day (BID) | ORAL | Status: DC
  Administered 2022-06-06 – 2022-06-07 (×2): 200 mg via ORAL
  Filled 2022-06-06 (×2): qty 1

## 2022-06-06 NOTE — Progress Notes (Signed)
PROGRESS NOTE    Darrell Horne  WJX:[id number] DOB: 06-Dec-1945 DOA: 06/03/2022 PCP: Sandrea Hughs, NP   Chief Complaint  Patient presents with   Drug Overdose    Brief Narrative:   Darrell Horne is a 76 y.o. male with medical history significant of chronic A-fib on Eliquis, IIDM, HTN, HLD, peripheral neuropathy, right foot infection status post left BKA, presented with multiple medication overdose.   Patient uses a pillbox with 7 separated double slots for easy administration.  Wife reported that patient has had frequent episodes of confusion and has missed several times for his medications lately.  Still, patient lives by himself and recently patient also became very forgetful and has burned himself while cooking several times.  At the time I saw the patient, patient is rather confused, not remember clearly what has happened.  But what he remembered is that he took his pills from the pill box two times today at around 7 AM and 11 AM.  Wife reported that she last patient normal yesterday evening, when she checked patient's medication box, there was still 5 days worth left.  This morning around noon time, wife went to patient's home and found patient very confused.  And 5 days worth of a.m. medications and 4 days worth of p.o. medications were missing.  Patient however has not denied any lightheadedness headache vision problems palpitations or chest pains.  He further denies any suicidal ideation or planning.   Patient has chronic A-fib, recently became uncontrolled.  Cardiology started patient on amiodarone 200 mg twice daily about 3 weeks ago.   ED Course: Episodes of bradycardia heart rate as low as 50s, and in A-fib.  EKG showed A-fib and no PR or QTc interval changes, no widened QRS either.  [facility] contacted and recommended observation for 24 hours.   Assessment & Plan:   Multiple medication overdose -Most concerning is Eliquis, given the risk of bleeding.  Monitor CBC  closely .  CBC remained stable.will resume back on Eliquis today. -He initially lethargic, now is more awake, will continue to hold Elavil, Topamax and gabapentin  -please see discussion below regarding  amiodarone and Toprol-XL -Continue to hold atorvastatin, mr LFTs and total CK so far reassuring  Acute toxic encephalopathy -Polypharmacy/drug toxicity from overdosing -CT head with no acute findings  -Patient gradually improving.   Question of early dementia -Question of whether still appropriate patient lives by himself, consult case management for long-term solution, at least will need supervised living. -TSH mildly elevated, likely sick euthyroid syndrome - B12 is stable at [id number] on folic acid     Coagulopathy, acute, secondary to overdose Eliquis -No acute bleeding,  will resume on ELiquis   A-fib with  -Chronic, initially rate controlled, did on Cardizem drip for A-fib with RVR  -C is reassuring on today's EKG, so we will start back on amiodarone (he was supposed to be 200 mg p.o. twice daily for 14 days from 8/17, so likely will decrease back to once daily in 1 to 2 days ), will start on low-dose Toprol-XL hoping to discontinue Cardizem drip in 1 to 2 days  -We will resume on Eliquis today  HTN>> hypotension -will resume low dose Toprol XL for HR control   IIDM -Hold off metformin -Continue to monitor CBG closely   Diabetic neuropathy -will resume low dose gabapentin  Urinary retention -bladder scan frequently and in and out as needed      DVT prophylaxis: SCD,Eliquis Code  Status: Full Family Communication: wife at bedside Disposition:   Status is: inpatinet   Consultants:  none  Subjective:  No significant events overnight, he denies any complaints today.  Objective: Vitals:   06/06/22 0041 06/06/22 0431 06/06/22 0758 06/06/22 0843  BP: 113/71 103/68 114/79   Pulse: 86 99 90   Resp: (!) '31 18 20   '$ Temp: 98.1 F (36.7 C) 97.9 F (36.6 C) 97.9  F (36.6 C)   TempSrc: Oral Oral Oral   SpO2: 91% 96% 93% 92%  Weight:      Height:        Intake/Output Summary (Last 24 hours) at 06/06/2022 1805 Last data filed at 06/06/2022 1618 Gross per 24 hour  Intake 3762.79 ml  Output --  Net 3762.79 ml    Filed Weights   06/03/22 1419  Weight: 91.5 kg    Examination:  Awake, communicative, impaired cognition and insight, frail, deconditioned, Symmetrical Chest wall movement, Good air movement bilaterally Irregular irregular, tachycardic Abdomen soft No Cyanosis, left AKA     Data Reviewed: I have personally reviewed following labs and imaging studies  CBC: Recent Labs  Lab 06/03/22 1505 06/04/22 0644 06/05/22 0314 06/06/22 0318  WBC 3.9* 3.4* 3.8* 5.9  NEUTROABS 2.0  --   --   --   HGB 11.1* 11.1* 12.0* 12.5*  HCT 33.8* 32.9* 35.9* 37.7*  MCV 105.6* 101.5* 102.6* 103.0*  PLT 104* 107* 115* 138*     Basic Metabolic Panel: Recent Labs  Lab 06/03/22 1641 06/04/22 0644 06/05/22 0314 06/06/22 0318  NA 139 138 139 135  K 3.7 3.3* 4.0 3.9  CL 111 111 109 106  CO2 21* '23 24 23  '$ GLUCOSE 91 115* 101* 134*  BUN 6* 6* 5* 5*  CREATININE 0.72 0.61 0.62 0.51*  CALCIUM 8.2* 8.0* 8.0* 8.2*  MG 1.8 1.7 2.1 1.8  PHOS  --   --   --  2.6     GFR: Estimated Creatinine Clearance: 89.3 mL/min (A) (by C-G formula based on SCr of 0.51 mg/dL (L)).  Liver Function Tests: Recent Labs  Lab 06/03/22 1641 06/04/22 0644  AST 29 26  ALT 22 19  ALKPHOS 79 70  BILITOT 1.0 0.6  PROT 5.8* 5.0*  ALBUMIN 2.2* 1.9*     CBG: Recent Labs  Lab 06/05/22 2027 06/06/22 0046 06/06/22 0800 06/06/22 1218 06/06/22 1537  GLUCAP 106* 123* 122* 112* 100*      No results found for this or any previous visit (from the past 240 hour(s)).       Radiology Studies: No results found.      Scheduled Meds:  amiodarone  200 mg Oral BID   apixaban  5 mg Oral BID   insulin aspart  0-9 Units Subcutaneous TID WC   metoprolol  succinate  12.5 mg Oral Daily   thiamine (VITAMIN B1) injection  100 mg Intravenous Q24H   Continuous Infusions:  dextrose 5 % and 0.45 % NaCl with KCl 10 mEq/L 75 mL/hr at 06/06/22 0324   diltiazem (CARDIZEM) infusion 5 mg/hr (06/06/22 0425)     LOS: 2 days       Darrell Climes, MD [facility]   To contact the attending provider between 7A-7P or the covering provider during after hours 7P-7A, please log into the web site [email or web address] and access using universal [place] password for that web site. If you do not have the password, please call the hospital operator.  06/06/2022, 6:05 PM

## 2022-06-07 DIAGNOSIS — I480 Paroxysmal atrial fibrillation: Secondary | ICD-10-CM | POA: Diagnosis not present

## 2022-06-07 DIAGNOSIS — T50901A Poisoning by unspecified drugs, medicaments and biological substances, accidental (unintentional), initial encounter: Secondary | ICD-10-CM | POA: Diagnosis not present

## 2022-06-07 LAB — BASIC METABOLIC PANEL
Anion gap: 5 (ref 5–15)
BUN: 5 mg/dL — ABNORMAL LOW (ref 8–23)
CO2: 24 mmol/L (ref 22–32)
Calcium: 8.4 mg/dL — ABNORMAL LOW (ref 8.9–10.3)
Chloride: 106 mmol/L (ref 98–111)
Creatinine, Ser: 0.55 mg/dL — ABNORMAL LOW (ref 0.61–1.24)
GFR, Estimated: >60 mL/min (ref >60)
Glucose, Bld: 108 mg/dL — ABNORMAL HIGH (ref 70–99)
Potassium: 4.1 mmol/L (ref 3.5–5.1)
Sodium: 135 mmol/L (ref 135–145)

## 2022-06-07 LAB — GLUCOSE, CAPILLARY
Glucose-Capillary: 150 mg/dL — ABNORMAL HIGH (ref 70–99)
Glucose-Capillary: 129 mg/dL — ABNORMAL HIGH (ref 70–99)
Glucose-Capillary: 140 mg/dL — ABNORMAL HIGH (ref 70–99)
Glucose-Capillary: 106 mg/dL — ABNORMAL HIGH (ref 70–99)

## 2022-06-07 LAB — CBC
HCT: 41.2 % (ref 39.0–52.0)
Hemoglobin: 14.1 g/dL (ref 13.0–17.0)
MCH: 35 pg — ABNORMAL HIGH (ref 26.0–34.0)
MCHC: 34.2 g/dL (ref 30.0–36.0)
MCV: 102.2 fL — ABNORMAL HIGH (ref 80.0–100.0)
Platelets: ADEQUATE 10*3/uL (ref 150–400)
RBC: 4.03 MIL/uL — ABNORMAL LOW (ref 4.22–5.81)
RDW: 14.4 % (ref 11.5–15.5)
WBC: 9 10*3/uL (ref 4.0–10.5)
nRBC: 0 % (ref 0.0–0.2)

## 2022-06-07 LAB — MAGNESIUM: Magnesium: 1.8 mg/dL (ref 1.7–2.4)

## 2022-06-07 LAB — PHOSPHORUS: Phosphorus: 2.6 mg/dL (ref 2.5–4.6)

## 2022-06-07 MED ORDER — B COMPLEX-C PO TABS
1.0000 | ORAL_TABLET | Freq: Every day | ORAL | Status: DC
  Administered 2022-06-08 – 2022-06-10 (×2): 1 via ORAL
  Filled 2022-06-07 (×6): qty 1

## 2022-06-07 MED ORDER — METOPROLOL SUCCINATE ER 50 MG PO TB24
50.0000 mg | ORAL_TABLET | Freq: Every day | ORAL | Status: DC
  Administered 2022-06-08 – 2022-06-09 (×2): 50 mg via ORAL
  Filled 2022-06-07 (×3): qty 1

## 2022-06-07 MED ORDER — METOPROLOL SUCCINATE ER 25 MG PO TB24
25.0000 mg | ORAL_TABLET | Freq: Once | ORAL | Status: AC
  Administered 2022-06-07: 25 mg via ORAL
  Filled 2022-06-07: qty 1

## 2022-06-07 MED ORDER — ATORVASTATIN CALCIUM 40 MG PO TABS
40.0000 mg | ORAL_TABLET | Freq: Every day | ORAL | Status: DC
  Administered 2022-06-07 – 2022-06-10 (×4): 40 mg via ORAL
  Filled 2022-06-07 (×4): qty 1

## 2022-06-07 MED ORDER — K PHOS MONO-SOD PHOS DI & MONO 155-852-130 MG PO TABS
500.0000 mg | ORAL_TABLET | Freq: Two times a day (BID) | ORAL | Status: AC
Start: 2022-06-07 — End: 2022-06-07
  Administered 2022-06-07 (×2): 500 mg via ORAL
  Filled 2022-06-07 (×2): qty 2

## 2022-06-07 MED ORDER — THIAMINE MONONITRATE 100 MG PO TABS
100.0000 mg | ORAL_TABLET | Freq: Every day | ORAL | Status: DC
  Administered 2022-06-08 – 2022-06-10 (×3): 100 mg via ORAL
  Filled 2022-06-07 (×3): qty 1

## 2022-06-07 MED ORDER — METFORMIN HCL ER 500 MG PO TB24
500.0000 mg | ORAL_TABLET | Freq: Every day | ORAL | Status: DC
  Administered 2022-06-07: 500 mg via ORAL
  Filled 2022-06-07: qty 1

## 2022-06-07 MED ORDER — FOLIC ACID 1 MG PO TABS
1.0000 mg | ORAL_TABLET | Freq: Every day | ORAL | Status: DC
  Administered 2022-06-07 – 2022-06-10 (×4): 1 mg via ORAL
  Filled 2022-06-07 (×4): qty 1

## 2022-06-07 MED ORDER — AMIODARONE HCL 200 MG PO TABS
200.0000 mg | ORAL_TABLET | Freq: Every day | ORAL | Status: DC
  Administered 2022-06-08 – 2022-06-10 (×3): 200 mg via ORAL
  Filled 2022-06-07 (×3): qty 1

## 2022-06-07 MED ORDER — MAGNESIUM SULFATE IN D5W 1-5 GM/100ML-% IV SOLN
1.0000 g | Freq: Once | INTRAVENOUS | Status: AC
  Administered 2022-06-07: 1 g via INTRAVENOUS
  Filled 2022-06-07: qty 100

## 2022-06-07 MED ORDER — TOPIRAMATE 25 MG PO TABS
75.0000 mg | ORAL_TABLET | Freq: Every day | ORAL | Status: DC
  Administered 2022-06-07 – 2022-06-10 (×4): 75 mg via ORAL
  Filled 2022-06-07 (×4): qty 3

## 2022-06-07 MED ORDER — PANTOPRAZOLE SODIUM 40 MG PO TBEC
40.0000 mg | DELAYED_RELEASE_TABLET | Freq: Every day | ORAL | Status: DC
  Administered 2022-06-07 – 2022-06-10 (×4): 40 mg via ORAL
  Filled 2022-06-07 (×4): qty 1

## 2022-06-07 NOTE — Care Management Important Message (Signed)
Important Message  Patient Details  Name: Darrell Horne MRN: [id number] Date of Birth: 1946/04/08   Medicare Important Message Given:  Yes     Orbie Pyo 06/07/2022, 3:36 PM

## 2022-06-07 NOTE — Progress Notes (Signed)
PROGRESS NOTE    Darrell Horne  INO:[id number] DOB: 29-Jul-1946 DOA: 06/03/2022 PCP: Sandrea Hughs, NP   Chief Complaint  Patient presents with   Drug Overdose    Brief Narrative:   Darrell Horne is a 76 y.o. male with medical history significant of chronic A-fib on Eliquis, IIDM, HTN, HLD, peripheral neuropathy, right foot infection status post left BKA, presented with multiple medication overdose.  Patient missed his meds for few days, so he remembered 1 day to take them all, so he take water for few days from his pillbox at once so he was admitted for further monitoring.,     Assessment & Plan:   Multiple medication overdose -Most concerning is Eliquis, given the risk of bleeding.  Monitor CBC closely .  CBC remained stable.will resume back on Eliquis today. -He initially lethargic, now is more awake, will continue to hold Elavil, Topamax and gabapentin  -please see discussion below regarding  amiodarone and Toprol-XL -Continue to hold atorvastatin, mr LFTs and total CK so far reassuring  Acute toxic encephalopathy -Polypharmacy/drug toxicity from overdosing -CT head with no acute findings  -Patient gradually improving.   Question of early dementia -Question of whether still appropriate patient lives by himself, consult case management for long-term solution, at least will need supervised living. -TSH mildly elevated, likely sick euthyroid syndrome - B12 is stable at [id number]-[id number] on folic acid     Coagulopathy, acute, secondary to overdose Eliquis -No acute bleeding, back on ELiquis   A-fib with RVR -Chronic, initially rate controlled, did on Cardizem drip for A-fib with RVR  -QTc within normal limit, so he started on amiodarone 200 mg oral daily (he was supposed to be 200 mg p.o. twice daily for 14 days from 8/17). -Cardizem drip, will discontinue today as increasing his Toprol-XL to home dose of 50 mg oral daily -Back on Eliquis 8/28  HTN>> hypotension -Hypotensive,  responded to fluids. -Now blood pressure has improved will uptitrate his Toprol-XL to home dose mainly for heart rate control   IIDM -Hold off metformin -Continue to monitor CBG closely   Diabetic neuropathy -will resume low dose gabapentin, uptitrate dose gradually  Urinary retention -bladder scan frequently and in and out as needed, continue with Flomax, no recurrence of retention      DVT prophylaxis: SCD,Eliquis Code Status: Full Family Communication: wife at bedside Disposition: SNF in 1 to 2 days  Status is: inpatinet   Consultants:  none  Subjective:  No significant events overnight, he denies any complaints today.  Objective: Vitals:   06/07/22 1200 06/07/22 1214 06/07/22 1512 06/07/22 1514  BP:  131/85    Pulse:  90 89 95  Resp:  (!) 22 15   Temp:      TempSrc:      SpO2: 95% 100%    Weight:      Height:        Intake/Output Summary (Last 24 hours) at 06/07/2022 1539 Last data filed at 06/07/2022 1522 Gross per 24 hour  Intake 2458.66 ml  Output 550 ml  Net 1908.66 ml   Filed Weights   06/03/22 1419  Weight: 91.5 kg    Examination:   Awake, alert, pleasant, impaired cognition and insight Good air entry bilaterally Irregular irregular, heart rate controlled Abdomen soft No cyanosis, left AKA    Data Reviewed: I have personally reviewed following labs and imaging studies  CBC: Recent Labs  Lab 06/03/22 1505 06/04/22 9476 06/05/22 5465 06/06/22 0318 06/07/22 0243  WBC 3.9* 3.4* 3.8* 5.9 9.0  NEUTROABS 2.0  --   --   --   --   HGB 11.1* 11.1* 12.0* 12.5* 14.1  HCT 33.8* 32.9* 35.9* 37.7* 41.2  MCV 105.6* 101.5* 102.6* 103.0* 102.2*  PLT 104* 107* 115* 138* PLATELETS APPEAR ADEQUATE    Basic Metabolic Panel: Recent Labs  Lab 06/03/22 1641 06/04/22 0644 06/05/22 0314 06/06/22 0318 06/07/22 0243  NA 139 138 139 135 135  K 3.7 3.3* 4.0 3.9 4.1  CL 111 111 109 106 106  CO2 21* '23 24 23 24  '$ GLUCOSE 91 115* 101* 134* 108*   BUN 6* 6* 5* 5* 5*  CREATININE 0.72 0.61 0.62 0.51* 0.55*  CALCIUM 8.2* 8.0* 8.0* 8.2* 8.4*  MG 1.8 1.7 2.1 1.8 1.8  PHOS  --   --   --  2.6 2.6    GFR: Estimated Creatinine Clearance: 89.3 mL/min (A) (by C-G formula based on SCr of 0.55 mg/dL (L)).  Liver Function Tests: Recent Labs  Lab 06/03/22 1641 06/04/22 0644  AST 29 26  ALT 22 19  ALKPHOS 79 70  BILITOT 1.0 0.6  PROT 5.8* 5.0*  ALBUMIN 2.2* 1.9*    CBG: Recent Labs  Lab 06/06/22 0800 06/06/22 1218 06/06/22 1537 06/07/22 0814 06/07/22 1254  GLUCAP 122* 112* 100* 150* 129*     No results found for this or any previous visit (from the past 240 hour(s)).       Radiology Studies: No results found.      Scheduled Meds:  [START ON 06/08/2022] amiodarone  200 mg Oral Daily   apixaban  5 mg Oral BID   atorvastatin  40 mg Oral Daily   b complex vitamins  1 tablet Oral Daily   folic acid  1 mg Oral Daily   insulin aspart  0-9 Units Subcutaneous TID WC   metFORMIN  500 mg Oral Q supper   metoprolol succinate  25 mg Oral Once   [START ON 06/08/2022] metoprolol succinate  50 mg Oral Daily   pantoprazole  40 mg Oral Daily   phosphorus  500 mg Oral BID   [START ON 06/08/2022] thiamine  100 mg Oral Daily   topiramate  75 mg Oral Daily   Continuous Infusions:  diltiazem (CARDIZEM) infusion 5 mg/hr (06/07/22 0804)     LOS: 3 days       Phillips Climes, MD [facility]   To contact the attending provider between 7A-7P or the covering provider during after hours 7P-7A, please log into the web site [email or web address] and access using universal [place] password for that web site. If you do not have the password, please call the hospital operator.  06/07/2022, 3:39 PM

## 2022-06-07 NOTE — Evaluation (Signed)
Occupational Therapy Evaluation Patient Details Name: Darrell Horne MRN: [id number] DOB: March 24, 1946 Today's Date: 06/07/2022   History of Present Illness Darrell Horne is a 76 y.o. male who presented with presented with multiple medication overdose. Admitted for Acute toxic encephalopathy--Polypharmacy/drug toxicity from overdosing. Past medical history significant of chronic A-fib on Eliquis, IIDM, HTN, HLD, peripheral neuropathy, right foot infection status post left BKA,   Clinical Impression   Patient is currently requiring assistance with ADLs including up to total assist with Lower body ADLs including toileting and up to max assist negotiating his prosthesis, up to moderate assist with Upper body ADLs,  as well as  moderate assist with bed mobility and up to Max As of 2 people assist with functional transfers to toilet.  Current level of function is below patient's typical baseline.  During this evaluation, patient was limited by generalized weakness, impaired activity tolerance, and cognitive deficits, all of which has the potential to impact patient's safety and independence during functional mobility, as well as performance for ADLs.   Pt's spouse endorses pt's inability to manage his own meds or safely cook his meals. Pt with short term memory deficits and poor insight into his impairments and safety hazards.  Patient lives alone in a 1st floor apartment, and his wife [name] is unable to provide 24/7 supervision and assistance but checks on his daily.  Pt's spouse agrees that pt cannot return to independent living as he is unable to demonstrate safety awareness and memory necessary to care for himself safely.  Patient demonstrates fair rehab potential, and should benefit from continued skilled occupational therapy services while in acute care to maximize safety, independence and quality of life at home.  Continued occupational therapy services in a SNF setting prior to return home is recommended.  After SNF recommend discharge to ALF or similar based on pt's needs at that time.   ?     Recommendations for follow up therapy are one component of a multi-disciplinary discharge planning process, led by the attending physician.  Recommendations may be updated based on patient status, additional functional criteria and insurance authorization.   Follow Up Recommendations  Skilled nursing-short term rehab (<3 hours/day)    Assistance Recommended at Discharge Frequent or constant Supervision/Assistance  Patient can return home with the following Two people to help with walking and/or transfers;A lot of help with walking and/or transfers;A lot of help with bathing/dressing/bathroom;Direct supervision/assist for medications management;Direct supervision/assist for financial management;Assistance with cooking/housework;Assist for transportation    Functional Status Assessment  Patient has had a recent decline in their functional status and demonstrates the ability to make significant improvements in function in a reasonable and predictable amount of time.  Equipment Recommendations  None recommended by OT    Recommendations for Other Services       Precautions / Restrictions Precautions Precautions: Fall Precaution Comments: h/o LT BKA with prosthesis in room Restrictions Weight Bearing Restrictions: No      Mobility Bed Mobility Overal bed mobility: Needs Assistance Bed Mobility: Supine to Sit, Sit to Supine     Supine to sit: Min assist, HOB elevated Sit to supine: Mod assist        Transfers                          Balance Overall balance assessment: Needs assistance, History of Falls   Sitting balance-Leahy Scale: Poor   Postural control: Posterior lean Standing balance support: Reliant on assistive  device for balance, Bilateral upper extremity supported Standing balance-Leahy Scale: Poor                             ADL either performed or  assessed with clinical judgement   ADL Overall ADL's : Needs assistance/impaired Eating/Feeding: Set up;Bed level   Grooming: Minimal assistance;Bed level;Cueing for sequencing   Upper Body Bathing: Cueing for sequencing;Minimal assistance;Bed level   Lower Body Bathing: Maximal assistance;Bed level;Sitting/lateral leans   Upper Body Dressing : Moderate assistance;Sitting   Lower Body Dressing: Moderate assistance;Sitting/lateral leans;Maximal assistance Lower Body Dressing Details (indicate cue type and reason): Pt donned RT sock with incomplete figure 4 at EOB and posterior LOB with need of Max As. Pt donned prosthetic with increased time, cues from wife and Min As. Doffed prosthetic with Max As from wife. Toilet Transfer: Maximal assistance;Moderate assistance;Rolling walker (2 wheels);Cueing for sequencing;+2 for physical assistance Toilet Transfer Details (indicate cue type and reason): Pt stood from EOB x 2 reps. 1st rep with MAx As of 2 and 2nd rep with Mod As of 2. Pt worked on Psychologist, counselling with RT foot and stepping with LT prosthetic with Mod As of 2 people. Toileting- Clothing Manipulation and Hygiene: Sit to/from stand;+2 for physical assistance;Total assistance Toileting - Clothing Manipulation Details (indicate cue type and reason): Pt stood with assist from OT and spouse to RW while RN provided total assist peri care.     Functional mobility during ADLs: Moderate assistance;Maximal assistance;Rolling walker (2 wheels);+2 for physical assistance       Vision   Vision Assessment?: No apparent visual deficits     Perception     Praxis      Pertinent Vitals/Pain Pain Assessment Pain Assessment: No/denies pain     Hand Dominance Right   Extremity/Trunk Assessment Upper Extremity Assessment Upper Extremity Assessment: Generalized weakness;RUE deficits/detail;LUE deficits/detail RUE Deficits / Details: Shoulder "bone on bone". Pt was supposed to have a  TSA but contraindicated by cardiologist.  Elbow to grip: WFL ROM with generalized weakness. LUE Deficits / Details: Also with shoulder limitations but not as severe as RT and not painful.           Communication Communication Communication: No difficulties   Cognition Arousal/Alertness: Awake/alert Behavior During Therapy: Impulsive Overall Cognitive Status: History of cognitive impairments - at baseline Area of Impairment: Orientation, Memory, Safety/judgement, Following commands, Problem solving, Awareness                 Orientation Level: Time, Situation   Memory: Decreased short-term memory Following Commands: Follows one step commands consistently Safety/Judgement: Decreased awareness of safety Awareness: Emergent Problem Solving: Slow processing, Requires verbal cues, Requires tactile cues       General Comments       Exercises     Shoulder Instructions      Home Living Family/patient expects to be discharged to:: Skilled nursing facility Living Arrangements: Alone Available Help at Discharge: Available PRN/intermittently (Pt's wife [name], check on pt daily.) Type of Home: Apartment Home Access: Level entry     Home Layout: One level     Bathroom Shower/Tub: Tub/shower unit;Curtain (has been sink bathing)   Bathroom Toilet: Handicapped height Bathroom Accessibility: Yes How Accessible: Accessible via wheelchair Home Equipment: [place] (2 wheels);Rollator (4 wheels);Tub bench;Grab bars - tub/shower;Hand held shower head;Wheelchair - manual   Additional Comments: LT prosthesis      Prior Functioning/Environment Prior Level of Function : Needs  assist;History of Falls (last six months)  Cognitive Assist : (P) Mobility (cognitive);ADLs (cognitive) Mobility (Cognitive): Intermittent cues ADLs (Cognitive): Intermittent cues Physical Assist : (P) Mobility (physical);ADLs (physical)   ADLs (physical): IADLs;Bathing Mobility Comments: Fall  frequently from EOB when trying to mobilize to [facility] per pt's spouse, [name].  Pt usually uses WC in and out of home. Uses prosthesis to pivot in and out of car with WC. Has not been able to tolerate ambulating with prostesis as pt requires RW and RT shoulder cannot tolerate WB. ADLs Comments: Burned self cooking and accidental OD using medication pill box which pt's wife [name] sets up. [name] takes pt to MD appointements and grocery store and pt uses scooters that store provides. [name] assists with IADLs as able.        OT Problem List: Decreased strength;Decreased cognition;Decreased range of motion;Decreased activity tolerance;Decreased safety awareness;Impaired balance (sitting and/or standing);Decreased knowledge of use of DME or AE;Decreased knowledge of precautions;Impaired UE functional use      OT Treatment/Interventions: Self-care/ADL training;Therapeutic activities;Cognitive remediation/compensation;DME and/or AE instruction;Patient/family education;Balance training    OT Goals(Current goals can be found in the care plan section) Acute Rehab OT Goals Patient Stated Goal: "To get up by myself" OT Goal Formulation: With patient/family Time For Goal Achievement: 06/21/22 Potential to Achieve Goals: Fair ADL Goals Pt Will Perform Lower Body Bathing: sitting/lateral leans;bed level;with set-up Pt Will Perform Upper Body Dressing: sitting;with set-up Pt Will Perform Lower Body Dressing: with adaptive equipment;with set-up;sitting/lateral leans;sit to/from stand (Including prosthesis) Pt Will Transfer to Toilet: stand pivot transfer;with modified independence;bedside commode Pt Will Perform Toileting - Clothing Manipulation and hygiene: with modified independence;with adaptive equipment Additional ADL Goal #1: Pt will improve sitting balance to good static and fair+ dynamic while engaging in EOB or edge of chair activities such as exercise or grooming, in order to increase safety and participation  during seated ADLs.  OT Frequency: Min 2X/week    Co-evaluation              AM-PAC OT "6 Clicks" Daily Activity     Outcome Measure Help from another person eating meals?: None Help from another person taking care of personal grooming?: A Little Help from another person toileting, which includes using toliet, bedpan, or urinal?: Total Help from another person bathing (including washing, rinsing, drying)?: A Lot Help from another person to put on and taking off regular upper body clothing?: A Little Help from another person to put on and taking off regular lower body clothing?: A Lot 6 Click Score: 15   End of Session Equipment Utilized During Treatment: Gait belt;Rolling walker (2 wheels) Nurse Communication: Mobility status  Activity Tolerance: Patient tolerated treatment well Patient left: in bed;with call bell/phone within reach;with bed alarm set;with nursing/sitter in room;with family/visitor present  OT Visit Diagnosis: Unsteadiness on feet (R26.81);Muscle weakness (generalized) (M62.81);History of falling (Z91.81);Repeated falls (R29.6)                Time: 8242-3536 OT Time Calculation (min): 48 min Charges:  OT General Charges $OT Visit: 1 Visit OT Evaluation $OT Eval Low Complexity: 1 Low OT Treatments $Self Care/Home Management : 8-22 mins  Anderson Malta, [place] Office: [phone] 06/07/2022  Julien Girt 06/07/2022, 1:24 PM

## 2022-06-07 NOTE — Progress Notes (Signed)
PT Cancellation Note  Patient Details Name: Darrell Horne MRN: [id number] DOB: May 30, 1946   Cancelled Treatment:    Reason Eval/Treat Not Completed: Other (comment) - just finished OT session, resting now. PT to check back at a later time.   Stacie Glaze, PT DPT Acute Rehabilitation Services Pager [phone]  Office [phone]    [name] 06/07/2022, 12:41 PM

## 2022-06-08 DIAGNOSIS — I480 Paroxysmal atrial fibrillation: Secondary | ICD-10-CM | POA: Diagnosis not present

## 2022-06-08 DIAGNOSIS — I10 Essential (primary) hypertension: Secondary | ICD-10-CM

## 2022-06-08 DIAGNOSIS — E785 Hyperlipidemia, unspecified: Secondary | ICD-10-CM | POA: Diagnosis not present

## 2022-06-08 DIAGNOSIS — T50901A Poisoning by unspecified drugs, medicaments and biological substances, accidental (unintentional), initial encounter: Secondary | ICD-10-CM | POA: Diagnosis not present

## 2022-06-08 LAB — MAGNESIUM: Magnesium: 1.8 mg/dL (ref 1.7–2.4)

## 2022-06-08 LAB — CBC
HCT: 36.7 % — ABNORMAL LOW (ref 39.0–52.0)
Hemoglobin: 12.1 g/dL — ABNORMAL LOW (ref 13.0–17.0)
MCH: 34.2 pg — ABNORMAL HIGH (ref 26.0–34.0)
MCHC: 33 g/dL (ref 30.0–36.0)
MCV: 103.7 fL — ABNORMAL HIGH (ref 80.0–100.0)
Platelets: UNDETERMINED 10*3/uL (ref 150–400)
RBC: 3.54 MIL/uL — ABNORMAL LOW (ref 4.22–5.81)
RDW: 14.6 % (ref 11.5–15.5)
WBC: 7.3 10*3/uL (ref 4.0–10.5)
nRBC: 0 % (ref 0.0–0.2)

## 2022-06-08 LAB — BASIC METABOLIC PANEL
Anion gap: 3 — ABNORMAL LOW (ref 5–15)
BUN: 8 mg/dL (ref 8–23)
CO2: 25 mmol/L (ref 22–32)
Calcium: 8.1 mg/dL — ABNORMAL LOW (ref 8.9–10.3)
Chloride: 109 mmol/L (ref 98–111)
Creatinine, Ser: 0.7 mg/dL (ref 0.61–1.24)
GFR, Estimated: >60 mL/min (ref >60)
Glucose, Bld: 83 mg/dL (ref 70–99)
Potassium: 3.8 mmol/L (ref 3.5–5.1)
Sodium: 137 mmol/L (ref 135–145)

## 2022-06-08 LAB — GLUCOSE, CAPILLARY
Glucose-Capillary: 102 mg/dL — ABNORMAL HIGH (ref 70–99)
Glucose-Capillary: 55 mg/dL — ABNORMAL LOW (ref 70–99)
Glucose-Capillary: 59 mg/dL — ABNORMAL LOW (ref 70–99)
Glucose-Capillary: 85 mg/dL (ref 70–99)
Glucose-Capillary: 86 mg/dL (ref 70–99)
Glucose-Capillary: 93 mg/dL (ref 70–99)
Glucose-Capillary: 93 mg/dL (ref 70–99)
Glucose-Capillary: 97 mg/dL (ref 70–99)
Glucose-Capillary: 99 mg/dL (ref 70–99)

## 2022-06-08 LAB — PHOSPHORUS: Phosphorus: 2.7 mg/dL (ref 2.5–4.6)
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
## 2022-06-08 NOTE — Progress Notes (Signed)
PROGRESS NOTE        PATIENT DETAILS Name: Darrell Horne Age: 76 y.o. Sex: male Date of Birth: 04-04-1946 Admit Date: 06/03/2022 Admitting Physician Lequita Halt, MD EGB:[id number], Nelda Bucks, NP  Brief Summary: Patient is a 76 y.o.  male with history of left BKA, A-fib on Eliquis, DM-2, HTN, HLD-who presented to the hospital with unintentional drug overdose.  Per history-patient missed a number of his medications for a few days-once he remembered-he took all of them at once.  Significant events: 8/25>> admit to [facility]-acute toxic encephalopathy due to unintentional drug overdose.  Significant studies: 8/25>> CT head: No acute abnormality  Significant microbiology data: None  Procedures: None  Consults: None  Subjective: Lying comfortably in bed-denies any chest pain or shortness of breath.  Objective: Vitals: Blood pressure 108/87, pulse 84, temperature 97.9 F (36.6 C), temperature source Oral, resp. rate 17, height '5\' 10"'$  (1.778 m), weight 91.5 kg, SpO2 97 %.   Exam: Gen Exam:Alert awake-not in any distress HEENT:atraumatic, normocephalic Chest: B/L clear to auscultation anteriorly CVS:S1S2 regular Abdomen:soft non tender, non distended Extremities:no edema-s/p left BKA Neurology: Non focal Skin: no rash  Pertinent Labs/Radiology:    Latest Ref Rng & Units 06/08/2022    8:22 AM 06/07/2022    2:43 AM 06/06/2022    3:18 AM  CBC  WBC 4.0 - 10.5 K/uL 7.3  9.0  5.9   Hemoglobin 13.0 - 17.0 g/dL 12.1  14.1  12.5   Hematocrit 39.0 - 52.0 % 36.7  41.2  37.7   Platelets 150 - 400 K/uL PLATELET CLUMPS NOTED ON SMEAR, UNABLE TO ESTIMATE  PLATELETS APPEAR ADEQUATE  138     Lab Results  Component Value Date   NA 137 06/08/2022   K 3.8 06/08/2022   CL 109 06/08/2022   CO2 25 06/08/2022      Assessment/Plan: Acute toxic encephalopathy due to unintentional drug overdose: Resolved with supportive care.  Hypotension: Due to medication side  effect-resolved  A-fib with RVR: Rate controlled-no longer on Cardizem gtt.-on amiodarone/beta-blocker.  Remains on Eliquis.  HTN: BP stable with Toprol.  HLD: Continue statin  DM-2 (A1c 4.8 on 7/11): Mild hypoglycemia this morning-stop metformin and SSI-monitor for now.  Peripheral neuropathy: Continue Neurontin  Left BKA  BMI: Estimated body mass index is 28.94 kg/m as calculated from the following:   Height as of this encounter: '5\' 10"'$  (1.778 m).   Weight as of this encounter: 91.5 kg.   Code status:   Code Status: Full Code   DVT Prophylaxis: apixaban (ELIQUIS) tablet 5 mg    Family Communication: None at bedside   Disposition Plan: Status is: Inpatient Remains inpatient appropriate because: Awaiting SNF bed   Planned Discharge Destination:Skilled nursing facility   Diet: Diet Order             DIET DYS 3 Room service appropriate? Yes; Fluid consistency: Thin  Diet effective now                     Antimicrobial agents: Anti-infectives (From admission, onward)    None        MEDICATIONS: Scheduled Meds:  amiodarone  200 mg Oral Daily   apixaban  5 mg Oral BID   atorvastatin  40 mg Oral Daily   B-complex with vitamin C  1 tablet Oral Daily  folic acid  1 mg Oral Daily   insulin aspart  0-9 Units Subcutaneous TID WC   metFORMIN  500 mg Oral Q supper   metoprolol succinate  50 mg Oral Daily   pantoprazole  40 mg Oral Daily   thiamine  100 mg Oral Daily   topiramate  75 mg Oral Daily   Continuous Infusions: PRN Meds:.acetaminophen, hydrALAZINE, ondansetron (ZOFRAN) IV   I have personally reviewed following labs and imaging studies  LABORATORY DATA: CBC: Recent Labs  Lab 06/03/22 1505 06/04/22 0644 06/05/22 0314 06/06/22 0318 06/07/22 0243 06/08/22 0822  WBC 3.9* 3.4* 3.8* 5.9 9.0 7.3  NEUTROABS 2.0  --   --   --   --   --   HGB 11.1* 11.1* 12.0* 12.5* 14.1 12.1*  HCT 33.8* 32.9* 35.9* 37.7* 41.2 36.7*  MCV 105.6* 101.5*  102.6* 103.0* 102.2* 103.7*  PLT 104* 107* 115* 138* PLATELETS APPEAR ADEQUATE PLATELET CLUMPS NOTED ON SMEAR, UNABLE TO ESTIMATE    Basic Metabolic Panel: Recent Labs  Lab 06/04/22 0644 06/05/22 0314 06/06/22 0318 06/07/22 0243 06/08/22 0822  NA 138 139 135 135 137  K 3.3* 4.0 3.9 4.1 3.8  CL 111 109 106 106 109  CO2 '23 24 23 24 25  '$ GLUCOSE 115* 101* 134* 108* 83  BUN 6* 5* 5* 5* 8  CREATININE 0.61 0.62 0.51* 0.55* 0.70  CALCIUM 8.0* 8.0* 8.2* 8.4* 8.1*  MG 1.7 2.1 1.8 1.8 1.8  PHOS  --   --  2.6 2.6 2.7    GFR: Estimated Creatinine Clearance: 89.3 mL/min (by C-G formula based on SCr of 0.7 mg/dL).  Liver Function Tests: Recent Labs  Lab 06/03/22 1641 06/04/22 0644  AST 29 26  ALT 22 19  ALKPHOS 79 70  BILITOT 1.0 0.6  PROT 5.8* 5.0*  ALBUMIN 2.2* 1.9*   No results for input(s): "LIPASE", "AMYLASE" in the last 168 hours. No results for input(s): "AMMONIA" in the last 168 hours.  Coagulation Profile: Recent Labs  Lab 06/03/22 1934  INR 1.3*    Cardiac Enzymes: Recent Labs  Lab 06/04/22 0644  CKTOTAL 59    BNP (last 3 results) No results for input(s): "PROBNP" in the last 8760 hours.  Lipid Profile: No results for input(s): "CHOL", "HDL", "LDLCALC", "TRIG", "CHOLHDL", "LDLDIRECT" in the last 72 hours.  Thyroid Function Tests: No results for input(s): "TSH", "T4TOTAL", "FREET4", "T3FREE", "THYROIDAB" in the last 72 hours.  Anemia Panel: Recent Labs    06/06/22 0318  FOLATE 8.0    Urine analysis:    Component Value Date/Time   COLORURINE YELLOW 06/04/2022 0615   APPEARANCEUR CLEAR 06/04/2022 0615   LABSPEC 1.008 06/04/2022 0615   PHURINE 6.0 06/04/2022 0615   GLUCOSEU NEGATIVE 06/04/2022 0615   HGBUR NEGATIVE 06/04/2022 0615   BILIRUBINUR NEGATIVE 06/04/2022 0615   KETONESUR NEGATIVE 06/04/2022 0615   PROTEINUR NEGATIVE 06/04/2022 0615   UROBILINOGEN 0.2 07/08/2014 0137   NITRITE NEGATIVE 06/04/2022 0615   LEUKOCYTESUR NEGATIVE  06/04/2022 0615    Sepsis Labs: Lactic Acid, Venous    Component Value Date/Time   LATICACIDVEN 1.08 04/17/2016 1600    MICROBIOLOGY: No results found for this or any previous visit (from the past 240 hour(s)).  RADIOLOGY STUDIES/RESULTS: No results found.   LOS: 4 days   Oren Binet, MD  [facility]    To contact the attending provider between 7A-7P or the covering provider during after hours 7P-7A, please log into the web site [email or web address] and access using  universal [place] password for that web site. If you do not have the password, please call the hospital operator.  06/08/2022, 11:41 AM

## 2022-06-08 NOTE — TOC Initial Note (Signed)
Transition of Care [facility]) - Initial/Assessment Note    Patient Details  Name: Darrell Horne MRN: [id number] Date of Birth: 07-28-1946  [facility]) CM/SW Contact:    Darrell Horne, Darrell Horne Work Phone Number: 06/08/2022, 3:30 PM  Clinical Narrative:                 MSW intern spoke with patient and patient's wife, Darrell Horne, at bedside and provided SNF bed offers. Patient's wife advised she will call CSW once she has made a decision.    Expected Discharge Plan: Skilled Nursing Facility Barriers to Discharge: Continued Medical Work up   Patient Goals and CMS Choice Patient states their goals for this hospitalization and ongoing recovery are:: Rehab CMS Medicare.gov Compare Post Acute Care list provided to:: Patient Choice offered to / list presented to : Spouse  Expected Discharge Plan and Services Expected Discharge Plan: [place] In-house Referral: Clinical Social Work     Living arrangements for the past 2 months: Apartment                                      Prior Living Arrangements/Services Living arrangements for the past 2 months: Apartment Lives with:: Spouse Patient language and need for interpreter reviewed:: Yes Do you feel safe going back to the place where you live?: Yes      Need for Family Participation in Patient Care: Yes (Comment) Care giver support system in place?: Yes (comment)   Criminal Activity/Legal Involvement Pertinent to Current Situation/Hospitalization: No - Comment as needed  Activities of Daily Living Home Assistive Devices/Equipment: Wheelchair, Prosthesis ADL Screening (condition at time of admission) Patient's cognitive ability adequate to safely complete daily activities?: No Is the patient deaf or have difficulty hearing?: Yes Does the patient have difficulty seeing, even when wearing glasses/contacts?: No Does the patient have difficulty concentrating, remembering, or making decisions?:  Yes Patient able to express need for assistance with ADLs?: Yes Does the patient have difficulty dressing or bathing?: Yes Independently performs ADLs?: No Communication: Needs assistance Is this a change from baseline?: Pre-admission baseline Dressing (OT): Needs assistance Is this a change from baseline?: Pre-admission baseline Grooming: Needs assistance Is this a change from baseline?: Pre-admission baseline Feeding: Independent Bathing: Needs assistance Is this a change from baseline?: Pre-admission baseline Toileting: Needs assistance Is this a change from baseline?: Pre-admission baseline In/Out Bed: Needs assistance Is this a change from baseline?: Pre-admission baseline Walks in Home: Needs assistance Is this a change from baseline?: Pre-admission baseline Does the patient have difficulty walking or climbing stairs?: Yes (left BKA) Weakness of Legs: None Weakness of Arms/Hands: None  Permission Sought/Granted Permission sought to share information with : Family Supports Permission granted to share information with : No  Share Information with NAME: Darrell Horne  Permission granted to share info w AGENCY: SNF  Permission granted to share info w Relationship: Spouse  Permission granted to share info w Contact Information: [phone]  Emotional Assessment Appearance:: Appears stated age Attitude/Demeanor/Rapport: Gracious Affect (typically observed): Accepting Orientation: : Oriented to Place, Oriented to Self Alcohol / Substance Use: Not Applicable Psych Involvement: No (comment)  Admission diagnosis:  Overdose [T50.901A] Overdose of undetermined intent, initial encounter [T50.904A] Patient Active Problem List   Diagnosis Date Noted   Overdose by ingestion 06/03/2022   Overdose 06/03/2022   Exudative age-related macular degeneration of left eye with active choroidal neovascularization ([place]) 12/16/2020  Diabetes mellitus without complication ([place]) 16/83/7290    Early stage nonexudative age-related macular degeneration of right eye 12/16/2020   Former smoker 12/14/2020   Positive hepatitis C antibody test 10/08/2019   Personal history of colonic polyps 11/06/2018   DDD (degenerative disc disease), lumbar 08/17/2018   DDD (degenerative disc disease), cervical 08/17/2018   Hx of BKA, left ([place]) 11/11/2016   High risk medication use 11/11/2016   Syncope 04/17/2016   Diabetic polyneuropathy associated with type 2 diabetes mellitus ([place]) 21/08/5519   Alcoholic peripheral neuropathy ([place]) 12/23/2015   Sensory ataxia 12/23/2015   Headache 11/04/2015   Chronic low back pain 11/04/2015   Neuropathy due to medical condition ([place]) 04/08/2015   Diabetes ([place]) 04/09/2014   Paroxysmal atrial fibrillation with RVR ([place])    DM2 (diabetes mellitus, type 2) ([place])    HTN (hypertension)    Palpitations    HLD (hyperlipidemia)    PCP:  Sandrea Hughs, NP Pharmacy:   [id number] [place], [place] [address]Phone: [phone]: [phone]    Social Determinants of Health (SDOH) Interventions    Readmission Risk Interventions     No data to display

## 2022-06-09 ENCOUNTER — Ambulatory Visit (HOSPITAL_COMMUNITY): Payer: Medicare Other | Admitting: Nurse Practitioner

## 2022-06-09 DIAGNOSIS — I480 Paroxysmal atrial fibrillation: Secondary | ICD-10-CM | POA: Diagnosis not present

## 2022-06-09 DIAGNOSIS — I10 Essential (primary) hypertension: Secondary | ICD-10-CM | POA: Diagnosis not present

## 2022-06-09 DIAGNOSIS — T50901A Poisoning by unspecified drugs, medicaments and biological substances, accidental (unintentional), initial encounter: Secondary | ICD-10-CM | POA: Diagnosis not present

## 2022-06-09 DIAGNOSIS — E785 Hyperlipidemia, unspecified: Secondary | ICD-10-CM | POA: Diagnosis not present

## 2022-06-09 LAB — GLUCOSE, CAPILLARY
Glucose-Capillary: 52 mg/dL — ABNORMAL LOW (ref 70–99)
Glucose-Capillary: 66 mg/dL — ABNORMAL LOW (ref 70–99)
Glucose-Capillary: 64 mg/dL — ABNORMAL LOW (ref 70–99)
Glucose-Capillary: 74 mg/dL (ref 70–99)
Glucose-Capillary: 95 mg/dL (ref 70–99)
Glucose-Capillary: 47 mg/dL — ABNORMAL LOW (ref 70–99)
Glucose-Capillary: 56 mg/dL — ABNORMAL LOW (ref 70–99)
Glucose-Capillary: 56 mg/dL — ABNORMAL LOW (ref 70–99)
Glucose-Capillary: 174 mg/dL — ABNORMAL HIGH (ref 70–99)
Glucose-Capillary: 73 mg/dL (ref 70–99)
Glucose-Capillary: 112 mg/dL — ABNORMAL HIGH (ref 70–99)
Glucose-Capillary: 112 mg/dL — ABNORMAL HIGH (ref 70–99)

## 2022-06-09 MED ORDER — DEXTROSE 50 % IV SOLN
INTRAVENOUS | Status: AC
  Administered 2022-06-09: 50 mL
  Filled 2022-06-09: qty 50

## 2022-06-09 MED ORDER — DEXTROSE 50 % IV SOLN
12.5000 g | Freq: Once | INTRAVENOUS | Status: DC
Start: 2022-06-09 — End: 2022-06-10

## 2022-06-09 NOTE — Progress Notes (Signed)
RN attempted to change dressing to wounds per order at this time. Wife at bedside states that she changed dressings and prefers to change dressings.

## 2022-06-09 NOTE — Progress Notes (Signed)
PROGRESS NOTE        PATIENT DETAILS Name: Darrell Horne Age: 76 y.o. Sex: male Date of Birth: 08-13-1946 Admit Date: 06/03/2022 Admitting Physician Lequita Halt, MD [id number], Nelda Bucks, NP  Brief Summary: Patient is a 76 y.o.  male with history of left BKA, A-fib on Eliquis, DM-2, HTN, HLD-who presented to the hospital with unintentional drug overdose.  Per history-patient missed a number of his medications for a few days-once he remembered-he took all of them at once.  Significant events: 8/25>> admit to [facility]-acute toxic encephalopathy due to unintentional drug overdose.  Significant studies: 8/25>> CT head: No acute abnormality  Significant microbiology data: None  Procedures: None  Consults: None  Subjective: Lying comfortably in bed-no chest pain or shortness of breath.  Objective: Vitals: Blood pressure 106/66, pulse (!) 103, temperature 97.6 F (36.4 C), temperature source Axillary, resp. rate 16, height '5\' 10"'$  (1.778 m), weight 91.5 kg, SpO2 98 %.   Exam: Gen Exam:Alert awake-not in any distress HEENT:atraumatic, normocephalic Chest: B/L clear to auscultation anteriorly CVS:S1S2 regular Abdomen:soft non tender, non distended Extremities:no edema-s/p left BKA Neurology: Non focal Skin: no rash   Pertinent Labs/Radiology:    Latest Ref Rng & Units 06/08/2022    8:22 AM 06/07/2022    2:43 AM 06/06/2022    3:18 AM  CBC  WBC 4.0 - 10.5 K/uL 7.3  9.0  5.9   Hemoglobin 13.0 - 17.0 g/dL 12.1  14.1  12.5   Hematocrit 39.0 - 52.0 % 36.7  41.2  37.7   Platelets 150 - 400 K/uL PLATELET CLUMPS NOTED ON SMEAR, UNABLE TO ESTIMATE  PLATELETS APPEAR ADEQUATE  138     Lab Results  Component Value Date   NA 137 06/08/2022   K 3.8 06/08/2022   CL 109 06/08/2022   CO2 25 06/08/2022      Assessment/Plan: Acute toxic encephalopathy due to unintentional drug overdose: Resolved with supportive care.  Hypotension: Due to medication side  effect-resolved  A-fib with RVR: Rate controlled-no longer on Cardizem gtt.-on amiodarone/beta-blocker.  Remains on Eliquis.  HTN: BP stable with Toprol.  HLD: Continue statin  DM-2 (A1c 4.8 on 7/11): Given hypoglycemia-no longer on SSI or metformin.  Monitor CBGs.  Encourage oral intake.  Recent Labs    06/09/22 0430 06/09/22 0459 06/09/22 0800  GLUCAP 64* 74 95     Peripheral neuropathy: Continue Neurontin  Left BKA  BMI: Estimated body mass index is 28.94 kg/m as calculated from the following:   Height as of this encounter: '5\' 10"'$  (1.778 m).   Weight as of this encounter: 91.5 kg.   Code status:   Code Status: Full Code   DVT Prophylaxis: apixaban (ELIQUIS) tablet 5 mg    Family Communication: None at bedside   Disposition Plan: Status is: Inpatient Remains inpatient appropriate because: Awaiting SNF bed   Planned Discharge Destination:Skilled nursing facility   Diet: Diet Order             DIET DYS 3 Room service appropriate? Yes; Fluid consistency: Thin  Diet effective now                     Antimicrobial agents: Anti-infectives (From admission, onward)    None        MEDICATIONS: Scheduled Meds:  amiodarone  200 mg Oral Daily  apixaban  5 mg Oral BID   atorvastatin  40 mg Oral Daily   B-complex with vitamin C  1 tablet Oral Daily   folic acid  1 mg Oral Daily   metoprolol succinate  50 mg Oral Daily   pantoprazole  40 mg Oral Daily   thiamine  100 mg Oral Daily   topiramate  75 mg Oral Daily   Continuous Infusions: PRN Meds:.acetaminophen, hydrALAZINE, ondansetron (ZOFRAN) IV   I have personally reviewed following labs and imaging studies  LABORATORY DATA: CBC: Recent Labs  Lab 06/03/22 1505 06/04/22 0644 06/05/22 0314 06/06/22 0318 06/07/22 0243 06/08/22 0822  WBC 3.9* 3.4* 3.8* 5.9 9.0 7.3  NEUTROABS 2.0  --   --   --   --   --   HGB 11.1* 11.1* 12.0* 12.5* 14.1 12.1*  HCT 33.8* 32.9* 35.9* 37.7* 41.2 36.7*   MCV 105.6* 101.5* 102.6* 103.0* 102.2* 103.7*  PLT 104* 107* 115* 138* PLATELETS APPEAR ADEQUATE PLATELET CLUMPS NOTED ON SMEAR, UNABLE TO ESTIMATE     Basic Metabolic Panel: Recent Labs  Lab 06/04/22 0644 06/05/22 0314 06/06/22 0318 06/07/22 0243 06/08/22 0822  NA 138 139 135 135 137  K 3.3* 4.0 3.9 4.1 3.8  CL 111 109 106 106 109  CO2 '23 24 23 24 25  '$ GLUCOSE 115* 101* 134* 108* 83  BUN 6* 5* 5* 5* 8  CREATININE 0.61 0.62 0.51* 0.55* 0.70  CALCIUM 8.0* 8.0* 8.2* 8.4* 8.1*  MG 1.7 2.1 1.8 1.8 1.8  PHOS  --   --  2.6 2.6 2.7     GFR: Estimated Creatinine Clearance: 89.3 mL/min (by C-G formula based on SCr of 0.7 mg/dL).  Liver Function Tests: Recent Labs  Lab 06/03/22 1641 06/04/22 0644  AST 29 26  ALT 22 19  ALKPHOS 79 70  BILITOT 1.0 0.6  PROT 5.8* 5.0*  ALBUMIN 2.2* 1.9*    No results for input(s): "LIPASE", "AMYLASE" in the last 168 hours. No results for input(s): "AMMONIA" in the last 168 hours.  Coagulation Profile: Recent Labs  Lab 06/03/22 1934  INR 1.3*     Cardiac Enzymes: Recent Labs  Lab 06/04/22 0644  CKTOTAL 59     BNP (last 3 results) No results for input(s): "PROBNP" in the last 8760 hours.  Lipid Profile: No results for input(s): "CHOL", "HDL", "LDLCALC", "TRIG", "CHOLHDL", "LDLDIRECT" in the last 72 hours.  Thyroid Function Tests: No results for input(s): "TSH", "T4TOTAL", "FREET4", "T3FREE", "THYROIDAB" in the last 72 hours.  Anemia Panel: No results for input(s): "VITAMINB12", "FOLATE", "FERRITIN", "TIBC", "IRON", "RETICCTPCT" in the last 72 hours.   Urine analysis:    Component Value Date/Time   COLORURINE YELLOW 06/04/2022 0615   APPEARANCEUR CLEAR 06/04/2022 0615   LABSPEC 1.008 06/04/2022 0615   PHURINE 6.0 06/04/2022 0615   GLUCOSEU NEGATIVE 06/04/2022 0615   HGBUR NEGATIVE 06/04/2022 0615   BILIRUBINUR NEGATIVE 06/04/2022 0615   KETONESUR NEGATIVE 06/04/2022 0615   PROTEINUR NEGATIVE 06/04/2022 0615    UROBILINOGEN 0.2 07/08/2014 0137   NITRITE NEGATIVE 06/04/2022 0615   LEUKOCYTESUR NEGATIVE 06/04/2022 0615    Sepsis Labs: Lactic Acid, Venous    Component Value Date/Time   LATICACIDVEN 1.08 04/17/2016 1600    MICROBIOLOGY: No results found for this or any previous visit (from the past 240 hour(s)).  RADIOLOGY STUDIES/RESULTS: No results found.   LOS: 5 days   Oren Binet, MD  [facility]    To contact the attending provider between 7A-7P or  the covering provider during after hours 7P-7A, please log into the web site [email or web address] and access using universal [place] password for that web site. If you do not have the password, please call the hospital operator.  06/09/2022, 12:14 PM

## 2022-06-09 NOTE — TOC Progression Note (Signed)
Transition of Care [facility]) - Progression Note    Patient Details  Name: TALLIN HART MRN: [id number] Date of Birth: November 17, 1945  Transition of Care [facility]) CM/SW [place], LCSW Phone Number: 06/09/2022, 1:33 PM  Clinical Narrative:    CSW spoke with patient's spouse and she stated she toured [facility] and requests them for SNF. CSW confirmed with [facility] that they can accept patient pending insurance authorization. CSW submitted clinicals to [place], Ref# [id number].    Expected Discharge Plan: Skilled Nursing Facility Barriers to Discharge: Insurance Authorization  Expected Discharge Plan and Services Expected Discharge Plan: [place] In-house Referral: Clinical Social Work   Post Acute Care Choice: [place] Living arrangements for the past 2 months: Apartment                                       Social Determinants of Health (SDOH) Interventions    Readmission Risk Interventions     No data to display

## 2022-06-09 NOTE — Plan of Care (Signed)

## 2022-06-09 NOTE — Progress Notes (Signed)
FSBS obtained and was 52. 231m OJ given and patient was about to drink about half. Checked after OJ was 66, peaches in syrup given and will recheck after done eating.

## 2022-06-10 DIAGNOSIS — E569 Vitamin deficiency, unspecified: Secondary | ICD-10-CM | POA: Diagnosis not present

## 2022-06-10 DIAGNOSIS — J918 Pleural effusion in other conditions classified elsewhere: Secondary | ICD-10-CM | POA: Diagnosis not present

## 2022-06-10 DIAGNOSIS — R627 Adult failure to thrive: Secondary | ICD-10-CM | POA: Diagnosis not present

## 2022-06-10 DIAGNOSIS — T50901A Poisoning by unspecified drugs, medicaments and biological substances, accidental (unintentional), initial encounter: Secondary | ICD-10-CM | POA: Diagnosis not present

## 2022-06-10 DIAGNOSIS — I9589 Other hypotension: Secondary | ICD-10-CM | POA: Diagnosis not present

## 2022-06-10 DIAGNOSIS — H353210 Exudative age-related macular degeneration, right eye, stage unspecified: Secondary | ICD-10-CM | POA: Diagnosis not present

## 2022-06-10 DIAGNOSIS — Z515 Encounter for palliative care: Secondary | ICD-10-CM | POA: Diagnosis not present

## 2022-06-10 DIAGNOSIS — R6521 Severe sepsis with septic shock: Secondary | ICD-10-CM | POA: Diagnosis not present

## 2022-06-10 DIAGNOSIS — E1142 Type 2 diabetes mellitus with diabetic polyneuropathy: Secondary | ICD-10-CM | POA: Diagnosis not present

## 2022-06-10 DIAGNOSIS — T300 Burn of unspecified body region, unspecified degree: Secondary | ICD-10-CM | POA: Diagnosis not present

## 2022-06-10 DIAGNOSIS — D649 Anemia, unspecified: Secondary | ICD-10-CM | POA: Diagnosis not present

## 2022-06-10 DIAGNOSIS — K7031 Alcoholic cirrhosis of liver with ascites: Secondary | ICD-10-CM | POA: Diagnosis present

## 2022-06-10 DIAGNOSIS — G9009 Other idiopathic peripheral autonomic neuropathy: Secondary | ICD-10-CM | POA: Diagnosis not present

## 2022-06-10 DIAGNOSIS — Z66 Do not resuscitate: Secondary | ICD-10-CM | POA: Diagnosis not present

## 2022-06-10 DIAGNOSIS — G9340 Encephalopathy, unspecified: Secondary | ICD-10-CM | POA: Diagnosis not present

## 2022-06-10 DIAGNOSIS — D689 Coagulation defect, unspecified: Secondary | ICD-10-CM | POA: Diagnosis not present

## 2022-06-10 DIAGNOSIS — J189 Pneumonia, unspecified organism: Secondary | ICD-10-CM | POA: Diagnosis not present

## 2022-06-10 DIAGNOSIS — Z7401 Bed confinement status: Secondary | ICD-10-CM | POA: Diagnosis not present

## 2022-06-10 DIAGNOSIS — J690 Pneumonitis due to inhalation of food and vomit: Secondary | ICD-10-CM | POA: Diagnosis not present

## 2022-06-10 DIAGNOSIS — T887XXA Unspecified adverse effect of drug or medicament, initial encounter: Secondary | ICD-10-CM | POA: Diagnosis not present

## 2022-06-10 DIAGNOSIS — D638 Anemia in other chronic diseases classified elsewhere: Secondary | ICD-10-CM | POA: Diagnosis not present

## 2022-06-10 DIAGNOSIS — R278 Other lack of coordination: Secondary | ICD-10-CM | POA: Diagnosis not present

## 2022-06-10 DIAGNOSIS — L89322 Pressure ulcer of left buttock, stage 2: Secondary | ICD-10-CM | POA: Diagnosis not present

## 2022-06-10 DIAGNOSIS — R231 Pallor: Secondary | ICD-10-CM | POA: Diagnosis not present

## 2022-06-10 DIAGNOSIS — J9601 Acute respiratory failure with hypoxia: Secondary | ICD-10-CM | POA: Diagnosis not present

## 2022-06-10 DIAGNOSIS — I10 Essential (primary) hypertension: Secondary | ICD-10-CM | POA: Diagnosis not present

## 2022-06-10 DIAGNOSIS — E782 Mixed hyperlipidemia: Secondary | ICD-10-CM | POA: Diagnosis not present

## 2022-06-10 DIAGNOSIS — Z20822 Contact with and (suspected) exposure to covid-19: Secondary | ICD-10-CM | POA: Diagnosis not present

## 2022-06-10 DIAGNOSIS — I340 Nonrheumatic mitral (valve) insufficiency: Secondary | ICD-10-CM | POA: Diagnosis not present

## 2022-06-10 DIAGNOSIS — E43 Unspecified severe protein-calorie malnutrition: Secondary | ICD-10-CM | POA: Diagnosis not present

## 2022-06-10 DIAGNOSIS — I480 Paroxysmal atrial fibrillation: Secondary | ICD-10-CM | POA: Diagnosis not present

## 2022-06-10 DIAGNOSIS — T6591XS Toxic effect of unspecified substance, accidental (unintentional), sequela: Secondary | ICD-10-CM | POA: Diagnosis not present

## 2022-06-10 DIAGNOSIS — E118 Type 2 diabetes mellitus with unspecified complications: Secondary | ICD-10-CM | POA: Diagnosis not present

## 2022-06-10 DIAGNOSIS — T50904A Poisoning by unspecified drugs, medicaments and biological substances, undetermined, initial encounter: Secondary | ICD-10-CM | POA: Diagnosis not present

## 2022-06-10 DIAGNOSIS — Z89512 Acquired absence of left leg below knee: Secondary | ICD-10-CM | POA: Diagnosis not present

## 2022-06-10 DIAGNOSIS — J90 Pleural effusion, not elsewhere classified: Secondary | ICD-10-CM | POA: Diagnosis not present

## 2022-06-10 DIAGNOSIS — I499 Cardiac arrhythmia, unspecified: Secondary | ICD-10-CM | POA: Diagnosis not present

## 2022-06-10 DIAGNOSIS — I4891 Unspecified atrial fibrillation: Secondary | ICD-10-CM | POA: Diagnosis not present

## 2022-06-10 DIAGNOSIS — M6281 Muscle weakness (generalized): Secondary | ICD-10-CM | POA: Diagnosis not present

## 2022-06-10 DIAGNOSIS — D61818 Other pancytopenia: Secondary | ICD-10-CM | POA: Diagnosis not present

## 2022-06-10 DIAGNOSIS — E119 Type 2 diabetes mellitus without complications: Secondary | ICD-10-CM | POA: Diagnosis not present

## 2022-06-10 DIAGNOSIS — Z7189 Other specified counseling: Secondary | ICD-10-CM | POA: Diagnosis not present

## 2022-06-10 DIAGNOSIS — G929 Unspecified toxic encephalopathy: Secondary | ICD-10-CM | POA: Diagnosis not present

## 2022-06-10 DIAGNOSIS — I472 Ventricular tachycardia, unspecified: Secondary | ICD-10-CM | POA: Diagnosis not present

## 2022-06-10 DIAGNOSIS — K766 Portal hypertension: Secondary | ICD-10-CM | POA: Diagnosis not present

## 2022-06-10 DIAGNOSIS — M2550 Pain in unspecified joint: Secondary | ICD-10-CM | POA: Diagnosis not present

## 2022-06-10 DIAGNOSIS — I4821 Permanent atrial fibrillation: Secondary | ICD-10-CM | POA: Diagnosis not present

## 2022-06-10 DIAGNOSIS — E1121 Type 2 diabetes mellitus with diabetic nephropathy: Secondary | ICD-10-CM | POA: Diagnosis not present

## 2022-06-10 DIAGNOSIS — G9341 Metabolic encephalopathy: Secondary | ICD-10-CM | POA: Diagnosis not present

## 2022-06-10 DIAGNOSIS — J95811 Postprocedural pneumothorax: Secondary | ICD-10-CM | POA: Diagnosis not present

## 2022-06-10 DIAGNOSIS — R079 Chest pain, unspecified: Secondary | ICD-10-CM | POA: Diagnosis not present

## 2022-06-10 DIAGNOSIS — T23201A Burn of second degree of right hand, unspecified site, initial encounter: Secondary | ICD-10-CM | POA: Diagnosis not present

## 2022-06-10 DIAGNOSIS — E870 Hyperosmolality and hypernatremia: Secondary | ICD-10-CM | POA: Diagnosis not present

## 2022-06-10 DIAGNOSIS — R509 Fever, unspecified: Secondary | ICD-10-CM | POA: Diagnosis not present

## 2022-06-10 DIAGNOSIS — F05 Delirium due to known physiological condition: Secondary | ICD-10-CM | POA: Diagnosis not present

## 2022-06-10 DIAGNOSIS — I4819 Other persistent atrial fibrillation: Secondary | ICD-10-CM | POA: Diagnosis not present

## 2022-06-10 DIAGNOSIS — A419 Sepsis, unspecified organism: Secondary | ICD-10-CM | POA: Diagnosis not present

## 2022-06-10 DIAGNOSIS — T25021A Burn of unspecified degree of right foot, initial encounter: Secondary | ICD-10-CM | POA: Diagnosis not present

## 2022-06-10 DIAGNOSIS — Z743 Need for continuous supervision: Secondary | ICD-10-CM | POA: Diagnosis not present

## 2022-06-10 DIAGNOSIS — K219 Gastro-esophageal reflux disease without esophagitis: Secondary | ICD-10-CM | POA: Diagnosis not present

## 2022-06-10 DIAGNOSIS — G928 Other toxic encephalopathy: Secondary | ICD-10-CM | POA: Diagnosis not present

## 2022-06-10 DIAGNOSIS — E785 Hyperlipidemia, unspecified: Secondary | ICD-10-CM | POA: Diagnosis not present

## 2022-06-10 DIAGNOSIS — J948 Other specified pleural conditions: Secondary | ICD-10-CM | POA: Diagnosis not present

## 2022-06-10 LAB — GLUCOSE, CAPILLARY
Glucose-Capillary: 78 mg/dL (ref 70–99)
Glucose-Capillary: 70 mg/dL (ref 70–99)
Glucose-Capillary: 130 mg/dL — ABNORMAL HIGH (ref 70–99)

## 2022-06-10 MED ORDER — METOPROLOL SUCCINATE ER 25 MG PO TB24
25.0000 mg | ORAL_TABLET | Freq: Every day | ORAL | Status: DC
  Filled 2022-06-10: qty 1

## 2022-06-10 MED ORDER — GABAPENTIN 600 MG PO TABS: 300.0000 mg | ORAL_TABLET | Freq: Three times a day (TID) | ORAL | Status: DC

## 2022-06-10 MED ORDER — METOPROLOL SUCCINATE ER 25 MG PO TB24: 25.0000 mg | ORAL_TABLET | Freq: Every day | ORAL | Status: DC

## 2022-06-10 NOTE — Discharge Summary (Signed)
PATIENT DETAILS Name: Darrell Horne Age: 76 y.o. Sex: male Date of Birth: 09/17/1946 MRN: [id number]. Admitting Physician: Lequita Halt, MD [id number], Nelda Bucks, NP  Admit Date: 06/03/2022 Discharge date: 06/10/2022  Recommendations for Outpatient Follow-up:  Follow up with PCP in 1-2 weeks Please obtain CMP/CBC in one week  Admitted From:  Home  Disposition: Skilled nursing facility   Discharge Condition: fair  CODE STATUS:   Code Status: Full Code   Diet recommendation:  Diet Order             Diet - low sodium heart healthy           DIET DYS 3 Room service appropriate? Yes; Fluid consistency: Thin  Diet effective now                    Brief Summary: Patient is a 76 y.o.  male with history of left BKA, A-fib on Eliquis, DM-2, HTN, HLD-who presented to the hospital with unintentional drug overdose.  Per history-patient missed a number of his medications for a few days-once he remembered-he took all of them at once.   Significant events: 8/25>> admit to [facility]-acute toxic encephalopathy due to unintentional drug overdose.   Significant studies: 8/25>> CT head: No acute abnormality   Significant microbiology data: None   Procedures: None   Consults: None   Assessment/Plan: Acute toxic encephalopathy due to unintentional drug overdose: Resolved with supportive care.   Hypotension: Due to medication side effect-resolved   A-fib with RVR: Rate controlled-no longer on Cardizem gtt.-on amiodarone/beta-blocker.  Remains on Eliquis.   HTN: BP stable with Toprol.   HLD: Continue statin   DM-2 (A1c 4.8 on 7/11): Given hypoglycemia-no longer on SSI or metformin.  Monitor CBGs.  Encourage oral intake.  Peripheral neuropathy: Continue Neurontin   Left BKA   BMI: Estimated body mass index is 28.94 kg/m as calculated from the following:   Height as of this encounter: '5\' 10"'$  (1.778 m).   Weight as of this encounter: 91.5 kg.   Discharge Diagnoses:   Principal Problem:   Overdose Active Problems:   Paroxysmal atrial fibrillation with RVR (HCC)   Overdose by ingestion   Discharge Instructions:  Activity:  As tolerated with Full fall precautions use walker/cane & assistance as needed   Discharge Instructions     Call MD for:  difficulty breathing, headache or visual disturbances   Complete by: As directed    Call MD for:  persistant nausea and vomiting   Complete by: As directed    Change dressing (specify)   Complete by: As directed    Dressing change:  1. Daily at 5am      Comments: Wound care to right medial thigh thermal injury (burn, full thickness), right LE full thickness wound and right lateral great toe full thickness wound: cleanse with NS, pat gently dry. Cover with xeroform gauze Kellie Simmering [phone]), top with dry gauze and secure thigh dressing with silicone foam, RLE dressing with Kerlix roll gauze/paper tape and right great toe dressing with conform bandaging/paper tape.  2.  Every shift Wound care to right upper extremity thermal injury sites, full thickness: Cleanse with NS, pat gently dry. Cover with folded layers of xeroform gauze Kellie Simmering [phone]), top with dry gauze, secure with Kerlix/paper tape (arm). Change xeroform twice daily.   Diet - low sodium heart healthy   Complete by: As directed    Discharge instructions   Complete by: As directed    Follow  with Primary MD  Ngetich, Nelda Bucks, NP in 1-2 weeks  Please get a complete blood count and chemistry panel checked by your Primary MD at your next visit, and again as instructed by your Primary MD.  Get Medicines reviewed and adjusted: Please take all your medications with you for your next visit with your Primary MD  Laboratory/radiological data: Please request your Primary MD to go over all hospital tests and procedure/radiological results at the follow up, please ask your Primary MD to get all Hospital records sent to his/her office.  In some cases, they will  be blood work, cultures and biopsy results pending at the time of your discharge. Please request that your primary care M.D. follows up on these results.  Also Note the following: If you experience worsening of your admission symptoms, develop shortness of breath, life threatening emergency, suicidal or homicidal thoughts you must seek medical attention immediately by calling 911 or calling your MD immediately  if symptoms less severe.  You must read complete instructions/literature along with all the possible adverse reactions/side effects for all the Medicines you take and that have been prescribed to you. Take any new Medicines after you have completely understood and accpet all the possible adverse reactions/side effects.   Do not drive when taking Pain medications or sleeping medications (Benzodaizepines)  Do not take more than prescribed Pain, Sleep and Anxiety Medications. It is not advisable to combine anxiety,sleep and pain medications without talking with your primary care practitioner  Special Instructions: If you have smoked or chewed Tobacco  in the last 2 yrs please stop smoking, stop any regular Alcohol  and or any Recreational drug use.  Wear Seat belts while driving.  Please note: You were cared for by a hospitalist during your hospital stay. Once you are discharged, your primary care physician will handle any further medical issues. Please note that NO REFILLS for any discharge medications will be authorized once you are discharged, as it is imperative that you return to your primary care physician (or establish a relationship with a primary care physician if you do not have one) for your post hospital discharge needs so that they can reassess your need for medications and monitor your lab values.   Increase activity slowly   Complete by: As directed       Allergies as of 06/10/2022   No Known Allergies      Medication List     STOP taking these medications     metFORMIN 500 MG 24 hr tablet Commonly known as: GLUCOPHAGE-XR   ramipril 1.25 MG capsule Commonly known as: ALTACE       TAKE these medications    acetaminophen 500 MG tablet Commonly known as: TYLENOL Take 1 tablet (500 mg total) by mouth every 6 (six) hours as needed for moderate pain.   amiodarone 200 MG tablet Commonly known as: PACERONE Take 1 tablet (200 mg total) by mouth 2 (two) times daily.   amitriptyline 50 MG tablet Commonly known as: ELAVIL TAKE 1 TABLET BY MOUTH EVERYDAY AT BEDTIME What changed: See the new instructions.   apixaban 5 MG Tabs tablet Commonly known as: Eliquis Take 1 tablet (5 mg total) by mouth 2 (two) times daily.   ascorbic acid 500 MG tablet Commonly known as: VITAMIN C Take 500 mg by mouth daily.   atorvastatin 40 MG tablet Commonly known as: LIPITOR Take 1 tablet (40 mg total) by mouth daily.   b complex vitamins tablet Take 1 tablet  by mouth daily.   cyanocobalamin 1000 MCG tablet Commonly known as: VITAMIN B12 Take 1,000 mcg by mouth daily.   folic acid 1 MG tablet Commonly known as: FOLVITE TAKE 1 TABLET BY MOUTH EVERY DAY   gabapentin 600 MG tablet Commonly known as: NEURONTIN Take 0.5 tablets (300 mg total) by mouth 3 (three) times daily. What changed: how much to take   ketoconazole 2 % cream Commonly known as: NIZORAL Apply 1 fingertip amount to each foot daily.   Menthol (Topical Analgesic) 5 % Ptch Apply 1 patch topically as needed (back pain).   metoprolol succinate 25 MG 24 hr tablet Commonly known as: TOPROL-XL Take 1 tablet (25 mg total) by mouth daily. Take with or immediately following a meal. What changed:  medication strength See the new instructions.   omeprazole 20 MG capsule Commonly known as: PRILOSEC TAKE 1 CAPSULE BY MOUTH EVERY DAY What changed: how much to take   OneTouch Verio test strip Generic drug: glucose blood USE TO TEST BLOOD SUGAR TWICE DAILY DX: E11.42   topiramate 50  MG tablet Commonly known as: TOPAMAX TAKE 1 AND 1/2 TABLETS BY MOUTH DAILY What changed: additional instructions               Discharge Care Instructions  (From admission, onward)           Start     Ordered   06/10/22 0000  Change dressing (specify)       Comments: Dressing change:  1. Daily at 5am      Comments: Wound care to right medial thigh thermal injury (burn, full thickness), right LE full thickness wound and right lateral great toe full thickness wound: cleanse with NS, pat gently dry. Cover with xeroform gauze Kellie Simmering [phone]), top with dry gauze and secure thigh dressing with silicone foam, RLE dressing with Kerlix roll gauze/paper tape and right great toe dressing with conform bandaging/paper tape.  2.  Every shift Wound care to right upper extremity thermal injury sites, full thickness: Cleanse with NS, pat gently dry. Cover with folded layers of xeroform gauze Kellie Simmering [phone]), top with dry gauze, secure with Kerlix/paper tape (arm). Change xeroform twice daily.   06/10/22 0948            Contact information for follow-up providers     Ngetich, Dinah C, NP. Schedule an appointment as soon as possible for a visit in 1 week(s).   Specialty: Family Medicine Contact information: [place] [address] [phone]              Contact information for after-discharge care     Destination     [facility] .   Service: Skilled Nursing Contact information: [address] [place] [phone]                    No Known Allergies   Other Procedures/Studies: DG Chest 1 View  Result Date: 06/04/2022 CLINICAL DATA:  Overdose EXAM: CHEST  1 VIEW COMPARISON:  April 17, 2016 FINDINGS: Bilateral pleural effusions with underlying atelectasis are identified. The heart, hila, and mediastinum are normal. No pneumothorax. No other acute abnormalities. IMPRESSION: New bilateral pleural  effusions with underlying opacities, likely atelectasis. No other acute abnormalities. Electronically Signed   By: Dorise Bullion III M.D.   On: 06/04/2022 08:26   CT HEAD WO CONTRAST (5MM)  Result Date: 06/03/2022 CLINICAL DATA:  Altered mental status EXAM: CT HEAD WITHOUT  CONTRAST TECHNIQUE: Contiguous axial images were obtained from the base of the skull through the vertex without intravenous contrast. RADIATION DOSE REDUCTION: This exam was performed according to the departmental dose-optimization program which includes automated exposure control, adjustment of the mA and/or kV according to patient size and/or use of iterative reconstruction technique. COMPARISON:  04/17/2016 FINDINGS: Brain: No evidence of acute infarction, hemorrhage, hydrocephalus, extra-axial collection or mass lesion/mass effect. Chronic atrophic changes are seen. Vascular: No hyperdense vessel or unexpected calcification. Skull: Normal. Negative for fracture or focal lesion. Sinuses/Orbits: No acute finding. Other: None. IMPRESSION: Chronic atrophic changes without acute abnormality. Electronically Signed   By: Inez Catalina M.D.   On: 06/03/2022 21:05     TODAY-DAY OF DISCHARGE:  Subjective:   Darrell Horne today has no headache,no chest abdominal pain,no new weakness tingling or numbness, feels much better wants to go home today.   Objective:   Blood pressure (!) 103/57, pulse 81, temperature 98.6 F (37 C), temperature source Axillary, resp. rate (!) 21, height '5\' 10"'$  (1.778 m), weight 91.5 kg, SpO2 99 %.  Intake/Output Summary (Last 24 hours) at 06/10/2022 0949 Last data filed at 06/09/2022 2130 Gross per 24 hour  Intake 120 ml  Output 750 ml  Net -630 ml   Filed Weights   06/03/22 1419  Weight: 91.5 kg    Exam: Awake Alert, Oriented *3, No new F.N deficits, Normal affect [place].AT,PERRAL Supple Neck,No JVD, No cervical lymphadenopathy appriciated.  Symmetrical Chest wall movement, Good air movement  bilaterally, CTAB RRR,No Gallops,Rubs or new Murmurs, No Parasternal Heave +ve B.Sounds, Abd Soft, Non tender, No organomegaly appriciated, No rebound -guarding or rigidity. No Cyanosis, Clubbing or edema, No new Rash or bruise   PERTINENT RADIOLOGIC STUDIES: No results found.   PERTINENT LAB RESULTS: CBC: Recent Labs    06/08/22 0822  WBC 7.3  HGB 12.1*  HCT 36.7*  PLT PLATELET CLUMPS NOTED ON SMEAR, UNABLE TO ESTIMATE   CMET CMP     Component Value Date/Time   NA 137 06/08/2022 0822   NA 140 11/04/2015 0931   K 3.8 06/08/2022 0822   CL 109 06/08/2022 0822   CO2 25 06/08/2022 0822   GLUCOSE 83 06/08/2022 0822   BUN 8 06/08/2022 0822   BUN 11 11/04/2015 0931   CREATININE 0.70 06/08/2022 0822   CREATININE 0.56 (L) 04/19/2022 1039   CALCIUM 8.1 (L) 06/08/2022 0822   PROT 5.0 (L) 06/04/2022 0644   PROT 6.7 11/04/2015 0931   ALBUMIN 1.9 (L) 06/04/2022 0644   ALBUMIN 3.6 11/04/2015 0931   AST 26 06/04/2022 0644   AST 26 09/18/2019 1003   ALT 19 06/04/2022 0644   ALT 27 09/18/2019 1003   ALKPHOS 70 06/04/2022 0644   BILITOT 0.6 06/04/2022 0644   BILITOT 0.5 09/18/2019 1003   GFRNONAA >60 06/08/2022 0822   GFRNONAA 95 03/24/2021 1052   GFRAA 110 03/24/2021 1052    GFR Estimated Creatinine Clearance: 89.3 mL/min (by C-G formula based on SCr of 0.7 mg/dL). No results for input(s): "LIPASE", "AMYLASE" in the last 72 hours. No results for input(s): "CKTOTAL", "CKMB", "CKMBINDEX", "TROPONINI" in the last 72 hours. Invalid input(s): "POCBNP" No results for input(s): "DDIMER" in the last 72 hours. No results for input(s): "HGBA1C" in the last 72 hours. No results for input(s): "CHOL", "HDL", "LDLCALC", "TRIG", "CHOLHDL", "LDLDIRECT" in the last 72 hours. No results for input(s): "TSH", "T4TOTAL", "T3FREE", "THYROIDAB" in the last 72 hours.  Invalid input(s): "FREET3" No results for input(s): "VITAMINB12", "  FOLATE", "FERRITIN", "TIBC", "IRON", "RETICCTPCT" in the last  72 hours. Coags: No results for input(s): "INR" in the last 72 hours.  Invalid input(s): "PT" Microbiology: No results found for this or any previous visit (from the past 240 hour(s)).  FURTHER DISCHARGE INSTRUCTIONS:  Get Medicines reviewed and adjusted: Please take all your medications with you for your next visit with your Primary MD  Laboratory/radiological data: Please request your Primary MD to go over all hospital tests and procedure/radiological results at the follow up, please ask your Primary MD to get all Hospital records sent to his/her office.  In some cases, they will be blood work, cultures and biopsy results pending at the time of your discharge. Please request that your primary care M.D. goes through all the records of your hospital data and follows up on these results.  Also Note the following: If you experience worsening of your admission symptoms, develop shortness of breath, life threatening emergency, suicidal or homicidal thoughts you must seek medical attention immediately by calling 911 or calling your MD immediately  if symptoms less severe.  You must read complete instructions/literature along with all the possible adverse reactions/side effects for all the Medicines you take and that have been prescribed to you. Take any new Medicines after you have completely understood and accpet all the possible adverse reactions/side effects.   Do not drive when taking Pain medications or sleeping medications (Benzodaizepines)  Do not take more than prescribed Pain, Sleep and Anxiety Medications. It is not advisable to combine anxiety,sleep and pain medications without talking with your primary care practitioner  Special Instructions: If you have smoked or chewed Tobacco  in the last 2 yrs please stop smoking, stop any regular Alcohol  and or any Recreational drug use.  Wear Seat belts while driving.  Please note: You were cared for by a hospitalist during your hospital  stay. Once you are discharged, your primary care physician will handle any further medical issues. Please note that NO REFILLS for any discharge medications will be authorized once you are discharged, as it is imperative that you return to your primary care physician (or establish a relationship with a primary care physician if you do not have one) for your post hospital discharge needs so that they can reassess your need for medications and monitor your lab values.  Total Time spent coordinating discharge including counseling, education and face to face time equals greater than 30 minutes.  SignedOren Binet 06/10/2022 9:49 AM

## 2022-06-10 NOTE — TOC Transition Note (Signed)
Transition of Care [facility]) - CM/SW Discharge Note   Patient Details  Name: Darrell Horne MRN: [id number] Date of Birth: 04-05-1946  Transition of Care [facility]) CM/SW Contact:  Benard Halsted, [place] Phone Number: 06/10/2022, 10:05 AM   Clinical Narrative:    [facility] will DC to: [facility] date: 06/10/22 Family notified: Spouse, Anna Transport by: Jonita Albee   Per MD patient ready for DC to [facility]. RN to call report prior to discharge [phone], room 307P). RN, patient, patient's family, and facility notified of DC. Discharge Summary and FL2 sent to facility. DC packet on chart. Ambulance transport requested for patient.   CSW will sign off for now as social work intervention is no longer needed. Please consult [place] again if new needs arise.     Final next level of care: Skilled Nursing Facility Barriers to Discharge: Barriers Resolved   Patient Goals and CMS Choice Patient states their goals for this hospitalization and ongoing recovery are:: Rehab CMS Medicare.gov Compare Post Acute Care list provided to:: Patient Choice offered to / list presented to : Spouse  Discharge Placement   Existing PASRR number confirmed : 06/10/22          Patient chooses bed at: [facility] Patient to be transferred to facility by: [place] Name of family member notified: Spouse Patient and family notified of of transfer: 06/10/22  Discharge Plan and Services In-house Referral: Clinical Social Work   Post Acute Care Choice: [place]                               Social Determinants of Health (SDOH) Interventions     Readmission Risk Interventions     No data to display

## 2022-06-10 NOTE — Discharge Instructions (Signed)

## 2022-06-10 NOTE — TOC Progression Note (Signed)
Transition of Care [facility]) - Progression Note    Patient Details  Name: Darrell Horne MRN: [id number] Date of Birth: 1946/02/21  Transition of Care [facility]) CM/SW [place], LCSW Phone Number: 06/10/2022, 9:12 AM  Clinical Narrative:    Insurance approval received for [facility]: Ref# [id number], Josem Kaufmann ID# [id number], effective 06/10/2022-06/14/2022.   Expected Discharge Plan: Skilled Nursing Facility Barriers to Discharge: Barriers Resolved  Expected Discharge Plan and Services Expected Discharge Plan: [place] In-house Referral: Clinical Social Work   Post Acute Care Choice: [place] Living arrangements for the past 2 months: Apartment                                       Social Determinants of Health (SDOH) Interventions    Readmission Risk Interventions     No data to display

## 2022-06-10 NOTE — Progress Notes (Signed)
Report given to [facility], [place] [phone]).

## 2022-06-13 DIAGNOSIS — G9009 Other idiopathic peripheral autonomic neuropathy: Secondary | ICD-10-CM | POA: Diagnosis not present

## 2022-06-13 DIAGNOSIS — I480 Paroxysmal atrial fibrillation: Secondary | ICD-10-CM | POA: Diagnosis not present

## 2022-06-13 DIAGNOSIS — T50901A Poisoning by unspecified drugs, medicaments and biological substances, accidental (unintentional), initial encounter: Secondary | ICD-10-CM | POA: Diagnosis not present

## 2022-06-13 DIAGNOSIS — M6281 Muscle weakness (generalized): Secondary | ICD-10-CM | POA: Diagnosis not present

## 2022-06-13 DIAGNOSIS — I10 Essential (primary) hypertension: Secondary | ICD-10-CM | POA: Diagnosis not present

## 2022-06-13 DIAGNOSIS — E1121 Type 2 diabetes mellitus with diabetic nephropathy: Secondary | ICD-10-CM | POA: Diagnosis not present

## 2022-06-13 DIAGNOSIS — G9341 Metabolic encephalopathy: Secondary | ICD-10-CM | POA: Diagnosis not present

## 2022-06-14 DIAGNOSIS — E782 Mixed hyperlipidemia: Secondary | ICD-10-CM | POA: Diagnosis not present

## 2022-06-14 DIAGNOSIS — I4821 Permanent atrial fibrillation: Secondary | ICD-10-CM | POA: Diagnosis not present

## 2022-06-14 DIAGNOSIS — Z89512 Acquired absence of left leg below knee: Secondary | ICD-10-CM | POA: Diagnosis not present

## 2022-06-14 DIAGNOSIS — T50901A Poisoning by unspecified drugs, medicaments and biological substances, accidental (unintentional), initial encounter: Secondary | ICD-10-CM | POA: Diagnosis not present

## 2022-06-14 DIAGNOSIS — E118 Type 2 diabetes mellitus with unspecified complications: Secondary | ICD-10-CM | POA: Diagnosis not present

## 2022-06-14 DIAGNOSIS — G929 Unspecified toxic encephalopathy: Secondary | ICD-10-CM | POA: Diagnosis not present

## 2022-06-14 DIAGNOSIS — E119 Type 2 diabetes mellitus without complications: Secondary | ICD-10-CM | POA: Diagnosis not present

## 2022-06-14 DIAGNOSIS — E569 Vitamin deficiency, unspecified: Secondary | ICD-10-CM | POA: Diagnosis not present

## 2022-06-14 DIAGNOSIS — E785 Hyperlipidemia, unspecified: Secondary | ICD-10-CM | POA: Diagnosis not present

## 2022-06-14 DIAGNOSIS — H353210 Exudative age-related macular degeneration, right eye, stage unspecified: Secondary | ICD-10-CM | POA: Diagnosis not present

## 2022-06-14 DIAGNOSIS — T6591XS Toxic effect of unspecified substance, accidental (unintentional), sequela: Secondary | ICD-10-CM | POA: Diagnosis not present

## 2022-06-14 DIAGNOSIS — E1142 Type 2 diabetes mellitus with diabetic polyneuropathy: Secondary | ICD-10-CM | POA: Diagnosis not present

## 2022-06-14 DIAGNOSIS — T23201A Burn of second degree of right hand, unspecified site, initial encounter: Secondary | ICD-10-CM | POA: Diagnosis not present

## 2022-06-14 DIAGNOSIS — T25021A Burn of unspecified degree of right foot, initial encounter: Secondary | ICD-10-CM | POA: Diagnosis not present

## 2022-06-14 DIAGNOSIS — I10 Essential (primary) hypertension: Secondary | ICD-10-CM | POA: Diagnosis not present

## 2022-06-14 DIAGNOSIS — I480 Paroxysmal atrial fibrillation: Secondary | ICD-10-CM | POA: Diagnosis not present

## 2022-06-14 DIAGNOSIS — K219 Gastro-esophageal reflux disease without esophagitis: Secondary | ICD-10-CM | POA: Diagnosis not present

## 2022-06-15 ENCOUNTER — Encounter: Payer: Medicare Other | Admitting: Orthopedic Surgery

## 2022-06-15 DIAGNOSIS — G9009 Other idiopathic peripheral autonomic neuropathy: Secondary | ICD-10-CM | POA: Diagnosis not present

## 2022-06-15 DIAGNOSIS — K219 Gastro-esophageal reflux disease without esophagitis: Secondary | ICD-10-CM | POA: Diagnosis not present

## 2022-06-15 DIAGNOSIS — G9341 Metabolic encephalopathy: Secondary | ICD-10-CM | POA: Diagnosis not present

## 2022-06-15 DIAGNOSIS — T50901A Poisoning by unspecified drugs, medicaments and biological substances, accidental (unintentional), initial encounter: Secondary | ICD-10-CM | POA: Diagnosis not present

## 2022-06-15 DIAGNOSIS — I4821 Permanent atrial fibrillation: Secondary | ICD-10-CM | POA: Diagnosis not present

## 2022-06-15 DIAGNOSIS — M6281 Muscle weakness (generalized): Secondary | ICD-10-CM | POA: Diagnosis not present

## 2022-06-15 DIAGNOSIS — E1142 Type 2 diabetes mellitus with diabetic polyneuropathy: Secondary | ICD-10-CM | POA: Diagnosis not present

## 2022-06-15 DIAGNOSIS — I9589 Other hypotension: Secondary | ICD-10-CM | POA: Diagnosis not present

## 2022-06-16 ENCOUNTER — Emergency Department (HOSPITAL_COMMUNITY): Payer: Medicare Other

## 2022-06-16 ENCOUNTER — Encounter (HOSPITAL_COMMUNITY): Payer: Self-pay

## 2022-06-16 ENCOUNTER — Inpatient Hospital Stay (HOSPITAL_COMMUNITY)
Admission: EM | Admit: 2022-06-16 | Discharge: 2022-07-16 | DRG: 432 | Disposition: A | Discharge status: Hospice | Payer: Medicare Other | Attending: Internal Medicine | Admitting: Internal Medicine

## 2022-06-16 DIAGNOSIS — Z87891 Personal history of nicotine dependence: Secondary | ICD-10-CM

## 2022-06-16 DIAGNOSIS — K766 Portal hypertension: Secondary | ICD-10-CM | POA: Diagnosis not present

## 2022-06-16 DIAGNOSIS — T50901A Poisoning by unspecified drugs, medicaments and biological substances, accidental (unintentional), initial encounter: Secondary | ICD-10-CM | POA: Diagnosis present

## 2022-06-16 DIAGNOSIS — L89322 Pressure ulcer of left buttock, stage 2: Secondary | ICD-10-CM | POA: Diagnosis not present

## 2022-06-16 DIAGNOSIS — K7031 Alcoholic cirrhosis of liver with ascites: Principal | ICD-10-CM | POA: Diagnosis present

## 2022-06-16 DIAGNOSIS — I480 Paroxysmal atrial fibrillation: Secondary | ICD-10-CM | POA: Diagnosis not present

## 2022-06-16 DIAGNOSIS — Z823 Family history of stroke: Secondary | ICD-10-CM

## 2022-06-16 DIAGNOSIS — K7682 Hepatic encephalopathy: Secondary | ICD-10-CM | POA: Diagnosis not present

## 2022-06-16 DIAGNOSIS — D638 Anemia in other chronic diseases classified elsewhere: Secondary | ICD-10-CM | POA: Diagnosis present

## 2022-06-16 DIAGNOSIS — T502X5A Adverse effect of carbonic-anhydrase inhibitors, benzothiadiazides and other diuretics, initial encounter: Secondary | ICD-10-CM | POA: Diagnosis not present

## 2022-06-16 DIAGNOSIS — I499 Cardiac arrhythmia, unspecified: Secondary | ICD-10-CM | POA: Diagnosis not present

## 2022-06-16 DIAGNOSIS — A419 Sepsis, unspecified organism: Secondary | ICD-10-CM | POA: Diagnosis not present

## 2022-06-16 DIAGNOSIS — Z66 Do not resuscitate: Secondary | ICD-10-CM | POA: Diagnosis not present

## 2022-06-16 DIAGNOSIS — Z743 Need for continuous supervision: Secondary | ICD-10-CM | POA: Diagnosis not present

## 2022-06-16 DIAGNOSIS — R918 Other nonspecific abnormal finding of lung field: Secondary | ICD-10-CM | POA: Diagnosis not present

## 2022-06-16 DIAGNOSIS — G9340 Encephalopathy, unspecified: Secondary | ICD-10-CM | POA: Diagnosis not present

## 2022-06-16 DIAGNOSIS — Z515 Encounter for palliative care: Secondary | ICD-10-CM

## 2022-06-16 DIAGNOSIS — J90 Pleural effusion, not elsewhere classified: Secondary | ICD-10-CM

## 2022-06-16 DIAGNOSIS — I10 Essential (primary) hypertension: Secondary | ICD-10-CM | POA: Diagnosis present

## 2022-06-16 DIAGNOSIS — R6521 Severe sepsis with septic shock: Secondary | ICD-10-CM | POA: Diagnosis not present

## 2022-06-16 DIAGNOSIS — J939 Pneumothorax, unspecified: Secondary | ICD-10-CM | POA: Diagnosis not present

## 2022-06-16 DIAGNOSIS — L89152 Pressure ulcer of sacral region, stage 2: Secondary | ICD-10-CM | POA: Diagnosis present

## 2022-06-16 DIAGNOSIS — E1142 Type 2 diabetes mellitus with diabetic polyneuropathy: Secondary | ICD-10-CM | POA: Diagnosis not present

## 2022-06-16 DIAGNOSIS — E876 Hypokalemia: Secondary | ICD-10-CM | POA: Diagnosis not present

## 2022-06-16 DIAGNOSIS — D689 Coagulation defect, unspecified: Secondary | ICD-10-CM | POA: Diagnosis not present

## 2022-06-16 DIAGNOSIS — J95811 Postprocedural pneumothorax: Secondary | ICD-10-CM

## 2022-06-16 DIAGNOSIS — Z6828 Body mass index (BMI) 28.0-28.9, adult: Secondary | ICD-10-CM

## 2022-06-16 DIAGNOSIS — J918 Pleural effusion in other conditions classified elsewhere: Secondary | ICD-10-CM | POA: Diagnosis not present

## 2022-06-16 DIAGNOSIS — I4891 Unspecified atrial fibrillation: Secondary | ICD-10-CM | POA: Diagnosis not present

## 2022-06-16 DIAGNOSIS — D61818 Other pancytopenia: Secondary | ICD-10-CM | POA: Diagnosis not present

## 2022-06-16 DIAGNOSIS — J189 Pneumonia, unspecified organism: Secondary | ICD-10-CM | POA: Diagnosis not present

## 2022-06-16 DIAGNOSIS — D649 Anemia, unspecified: Secondary | ICD-10-CM | POA: Diagnosis not present

## 2022-06-16 DIAGNOSIS — R231 Pallor: Secondary | ICD-10-CM | POA: Diagnosis not present

## 2022-06-16 DIAGNOSIS — I4819 Other persistent atrial fibrillation: Secondary | ICD-10-CM | POA: Diagnosis present

## 2022-06-16 DIAGNOSIS — R0602 Shortness of breath: Secondary | ICD-10-CM | POA: Diagnosis not present

## 2022-06-16 DIAGNOSIS — E782 Mixed hyperlipidemia: Secondary | ICD-10-CM | POA: Diagnosis not present

## 2022-06-16 DIAGNOSIS — E8770 Fluid overload, unspecified: Secondary | ICD-10-CM | POA: Diagnosis present

## 2022-06-16 DIAGNOSIS — F05 Delirium due to known physiological condition: Secondary | ICD-10-CM | POA: Diagnosis not present

## 2022-06-16 DIAGNOSIS — E43 Unspecified severe protein-calorie malnutrition: Secondary | ICD-10-CM | POA: Diagnosis present

## 2022-06-16 DIAGNOSIS — Z7901 Long term (current) use of anticoagulants: Secondary | ICD-10-CM

## 2022-06-16 DIAGNOSIS — J948 Other specified pleural conditions: Secondary | ICD-10-CM | POA: Diagnosis not present

## 2022-06-16 DIAGNOSIS — Z4682 Encounter for fitting and adjustment of non-vascular catheter: Secondary | ICD-10-CM | POA: Diagnosis not present

## 2022-06-16 DIAGNOSIS — R410 Disorientation, unspecified: Secondary | ICD-10-CM | POA: Diagnosis not present

## 2022-06-16 DIAGNOSIS — E870 Hyperosmolality and hypernatremia: Secondary | ICD-10-CM | POA: Diagnosis not present

## 2022-06-16 DIAGNOSIS — R5381 Other malaise: Secondary | ICD-10-CM | POA: Diagnosis present

## 2022-06-16 DIAGNOSIS — K219 Gastro-esophageal reflux disease without esophagitis: Secondary | ICD-10-CM | POA: Diagnosis present

## 2022-06-16 DIAGNOSIS — Z20822 Contact with and (suspected) exposure to covid-19: Secondary | ICD-10-CM | POA: Diagnosis not present

## 2022-06-16 DIAGNOSIS — E119 Type 2 diabetes mellitus without complications: Secondary | ICD-10-CM

## 2022-06-16 DIAGNOSIS — J9811 Atelectasis: Secondary | ICD-10-CM | POA: Diagnosis not present

## 2022-06-16 DIAGNOSIS — R079 Chest pain, unspecified: Secondary | ICD-10-CM | POA: Diagnosis not present

## 2022-06-16 DIAGNOSIS — R627 Adult failure to thrive: Secondary | ICD-10-CM | POA: Diagnosis not present

## 2022-06-16 DIAGNOSIS — J811 Chronic pulmonary edema: Secondary | ICD-10-CM | POA: Diagnosis not present

## 2022-06-16 DIAGNOSIS — T300 Burn of unspecified body region, unspecified degree: Secondary | ICD-10-CM | POA: Diagnosis not present

## 2022-06-16 DIAGNOSIS — J690 Pneumonitis due to inhalation of food and vomit: Secondary | ICD-10-CM | POA: Diagnosis not present

## 2022-06-16 DIAGNOSIS — Z7189 Other specified counseling: Secondary | ICD-10-CM | POA: Diagnosis not present

## 2022-06-16 DIAGNOSIS — R0600 Dyspnea, unspecified: Secondary | ICD-10-CM | POA: Diagnosis not present

## 2022-06-16 DIAGNOSIS — J9601 Acute respiratory failure with hypoxia: Secondary | ICD-10-CM | POA: Diagnosis present

## 2022-06-16 DIAGNOSIS — Z809 Family history of malignant neoplasm, unspecified: Secondary | ICD-10-CM

## 2022-06-16 DIAGNOSIS — I459 Conduction disorder, unspecified: Secondary | ICD-10-CM | POA: Diagnosis present

## 2022-06-16 DIAGNOSIS — E8809 Other disorders of plasma-protein metabolism, not elsewhere classified: Secondary | ICD-10-CM | POA: Diagnosis present

## 2022-06-16 DIAGNOSIS — L8990 Pressure ulcer of unspecified site, unspecified stage: Secondary | ICD-10-CM | POA: Insufficient documentation

## 2022-06-16 DIAGNOSIS — Z79899 Other long term (current) drug therapy: Secondary | ICD-10-CM

## 2022-06-16 DIAGNOSIS — E871 Hypo-osmolality and hyponatremia: Secondary | ICD-10-CM | POA: Diagnosis present

## 2022-06-16 DIAGNOSIS — G928 Other toxic encephalopathy: Secondary | ICD-10-CM | POA: Diagnosis not present

## 2022-06-16 DIAGNOSIS — G319 Degenerative disease of nervous system, unspecified: Secondary | ICD-10-CM | POA: Diagnosis not present

## 2022-06-16 DIAGNOSIS — K761 Chronic passive congestion of liver: Secondary | ICD-10-CM | POA: Diagnosis present

## 2022-06-16 DIAGNOSIS — Z89512 Acquired absence of left leg below knee: Secondary | ICD-10-CM

## 2022-06-16 DIAGNOSIS — R509 Fever, unspecified: Secondary | ICD-10-CM | POA: Diagnosis not present

## 2022-06-16 DIAGNOSIS — R188 Other ascites: Secondary | ICD-10-CM | POA: Diagnosis not present

## 2022-06-16 DIAGNOSIS — Z9049 Acquired absence of other specified parts of digestive tract: Secondary | ICD-10-CM

## 2022-06-16 DIAGNOSIS — Z833 Family history of diabetes mellitus: Secondary | ICD-10-CM

## 2022-06-16 DIAGNOSIS — D6489 Other specified anemias: Secondary | ICD-10-CM | POA: Diagnosis not present

## 2022-06-16 DIAGNOSIS — Z993 Dependence on wheelchair: Secondary | ICD-10-CM

## 2022-06-16 DIAGNOSIS — Z8249 Family history of ischemic heart disease and other diseases of the circulatory system: Secondary | ICD-10-CM

## 2022-06-16 DIAGNOSIS — E785 Hyperlipidemia, unspecified: Secondary | ICD-10-CM | POA: Diagnosis present

## 2022-06-16 DIAGNOSIS — I340 Nonrheumatic mitral (valve) insufficiency: Secondary | ICD-10-CM | POA: Diagnosis not present

## 2022-06-16 DIAGNOSIS — G9341 Metabolic encephalopathy: Secondary | ICD-10-CM | POA: Diagnosis not present

## 2022-06-16 DIAGNOSIS — K7460 Unspecified cirrhosis of liver: Secondary | ICD-10-CM | POA: Diagnosis not present

## 2022-06-16 DIAGNOSIS — K6389 Other specified diseases of intestine: Secondary | ICD-10-CM | POA: Diagnosis not present

## 2022-06-16 DIAGNOSIS — E1165 Type 2 diabetes mellitus with hyperglycemia: Secondary | ICD-10-CM | POA: Diagnosis not present

## 2022-06-16 DIAGNOSIS — I472 Ventricular tachycardia, unspecified: Secondary | ICD-10-CM | POA: Diagnosis not present

## 2022-06-16 DIAGNOSIS — J9690 Respiratory failure, unspecified, unspecified whether with hypoxia or hypercapnia: Secondary | ICD-10-CM | POA: Diagnosis not present

## 2022-06-16 DIAGNOSIS — K5939 Other megacolon: Secondary | ICD-10-CM | POA: Diagnosis not present

## 2022-06-16 DIAGNOSIS — R9389 Abnormal findings on diagnostic imaging of other specified body structures: Secondary | ICD-10-CM | POA: Diagnosis not present

## 2022-06-16 LAB — URINALYSIS, ROUTINE W REFLEX MICROSCOPIC
Bilirubin Urine: NEGATIVE
Glucose, UA: NEGATIVE mg/dL
Hgb urine dipstick: NEGATIVE
Ketones, ur: NEGATIVE mg/dL
Leukocytes,Ua: NEGATIVE
Nitrite: NEGATIVE
Protein, ur: NEGATIVE mg/dL
Specific Gravity, Urine: 1.02 (ref 1.005–1.030)
pH: 5 (ref 5.0–8.0)

## 2022-06-16 LAB — CBC WITH DIFFERENTIAL/PLATELET
Abs Immature Granulocytes: 0.01 10*3/uL (ref 0.00–0.07)
Basophils Absolute: 0 10*3/uL (ref 0.0–0.1)
Basophils Relative: 0 %
Eosinophils Absolute: 0.2 10*3/uL (ref 0.0–0.5)
Eosinophils Relative: 4 %
HCT: 37.3 % — ABNORMAL LOW (ref 39.0–52.0)
Hemoglobin: 12 g/dL — ABNORMAL LOW (ref 13.0–17.0)
Immature Granulocytes: 0 %
Lymphocytes Relative: 21 %
Lymphs Abs: 0.8 10*3/uL (ref 0.7–4.0)
MCH: 34.6 pg — ABNORMAL HIGH (ref 26.0–34.0)
MCHC: 32.2 g/dL (ref 30.0–36.0)
MCV: 107.5 fL — ABNORMAL HIGH (ref 80.0–100.0)
Monocytes Absolute: 0.4 10*3/uL (ref 0.1–1.0)
Monocytes Relative: 10 %
Neutro Abs: 2.5 10*3/uL (ref 1.7–7.7)
Neutrophils Relative %: 65 %
Platelets: 149 10*3/uL — ABNORMAL LOW (ref 150–400)
RBC: 3.47 MIL/uL — ABNORMAL LOW (ref 4.22–5.81)
RDW: 13.7 % (ref 11.5–15.5)
WBC: 3.8 10*3/uL — ABNORMAL LOW (ref 4.0–10.5)
nRBC: 0 % (ref 0.0–0.2)

## 2022-06-16 LAB — COMPREHENSIVE METABOLIC PANEL
ALT: 33 U/L (ref 0–44)
AST: 51 U/L — ABNORMAL HIGH (ref 15–41)
Albumin: 1.8 g/dL — ABNORMAL LOW (ref 3.5–5.0)
Alkaline Phosphatase: 196 U/L — ABNORMAL HIGH (ref 38–126)
Anion gap: 4 — ABNORMAL LOW (ref 5–15)
BUN: 15 mg/dL (ref 8–23)
CO2: 26 mmol/L (ref 22–32)
Calcium: 8.1 mg/dL — ABNORMAL LOW (ref 8.9–10.3)
Chloride: 110 mmol/L (ref 98–111)
Creatinine, Ser: 0.63 mg/dL (ref 0.61–1.24)
GFR, Estimated: >60 mL/min (ref >60)
Glucose, Bld: 116 mg/dL — ABNORMAL HIGH (ref 70–99)
Potassium: 3.7 mmol/L (ref 3.5–5.1)
Sodium: 140 mmol/L (ref 135–145)
Total Bilirubin: 0.7 mg/dL (ref 0.3–1.2)
Total Protein: 5.4 g/dL — ABNORMAL LOW (ref 6.5–8.1)

## 2022-06-16 LAB — BRAIN NATRIURETIC PEPTIDE: B Natriuretic Peptide: 341.3 pg/mL — ABNORMAL HIGH (ref 0.0–100.0)

## 2022-06-16 LAB — SARS CORONAVIRUS 2 BY RT PCR: SARS Coronavirus 2 by RT PCR: NEGATIVE

## 2022-06-16 LAB — AMMONIA: Ammonia: 39 umol/L — ABNORMAL HIGH (ref 9–35)

## 2022-06-16 MED ORDER — LACTATED RINGERS IV SOLN: INTRAVENOUS | Status: DC

## 2022-06-16 MED ORDER — ACETAMINOPHEN 650 MG RE SUPP: 650.0000 mg | Freq: Four times a day (QID) | RECTAL | Status: DC | PRN

## 2022-06-16 MED ORDER — ACETAMINOPHEN 325 MG PO TABS: 650.0000 mg | ORAL_TABLET | Freq: Four times a day (QID) | ORAL | Status: DC | PRN

## 2022-06-16 NOTE — ED Provider Notes (Signed)
  Physical Exam  BP 115/86   Pulse 84   Temp 98 F (36.7 C) (Oral)   Resp 19   Ht '5\' 10"'$  (1.778 m)   Wt 91.5 kg   SpO2 96%   BMI 28.94 kg/m   Physical Exam  Procedures  Procedures  ED Course / MDM    Medical Decision Making Amount and/or Complexity of Data Reviewed Labs: ordered. Radiology: ordered.  Risk Prescription drug management.  Received patient in signout.  Mental status change.  Sent in from nursing home.  Recent admission for encephalopathy thought to be due to medications.  However is reportedly confused again.  Head CT reassuring.  Blood work reassuring although BNP is elevated.  Does have worsening effusion on the left side and pneumonia cannot necessarily be ruled out.  With mental status change he would benefit from mission to the hospital.  Will discuss with hospitalist.  Does have a history of atrial fibrillation but do not see a recent echocardiogram.       Davonna Belling, MD 06/16/22 2240

## 2022-06-16 NOTE — ED Notes (Signed)
Pt noted to have bruising to upper left and and edema in extremities. Provider aware.

## 2022-06-16 NOTE — H&P (Signed)
History and Physical    PLEASE NOTE THAT DRAGON DICTATION SOFTWARE WAS USED IN THE CONSTRUCTION OF THIS NOTE.   Darrell Horne IWL:[id number] DOB: 1946/06/29 DOA: 06/16/2022  PCP: Sandrea Hughs, NP  Patient coming from: home   I have personally briefly reviewed patient's old medical records in [place]  Chief Complaint: Altered mental status  HPI: Darrell Horne is a 76 y.o. male with medical history significant for paroxysmal atrial fibrillation chronically anticoagulated on Eliquis, status post left BKA, GERD, essential hypertension, hyperlipidemia, who is admitted to [facility] on 06/16/2022 with acute encephalopathy after presenting from SNF to [facility] ED for evaluation of altered mental status.  In the context of the patient's altered mental status, the following history is provided via my discussions with the EDP as well as via chart review.  Patient was just hospitalized in the cath system from 06/03/2022 to 06/10/2022 for acute encephalopathy felt to be toxic in nature of the basis of unintentional overdose.  Mental status improved over course hospitalization, the patient was discharged to SNF, where staff noted him to exhibit evidence of new confusion over the course the last 1 to 2 days, prompting the patient brought to [facility] emergency department seen for further evaluation management thereof.  Medical history notable for type 2 diabetes mellitus, [place] lifestyle modifications in the setting of most recent hemoglobin A1c of 4.8% in July 2023.  This is complicated by diabetic peripheral polyneuropathy for which the patient is on gabapentin as well as Topamax.     ED Course:  Vital signs in the ED were notable for the following: Afebrile; heart rate 17-40; systolic blood pressures in the low 100s; respiratory rate 16-21, oxygen saturation 94 to 100% on 4 L nasal cannula, with unclear baseline supplemental oxygen requirements.  Labs were notable for the following: CMP  notable for the following: Sodium 140, bicarbonate 26, creatinine 0.63, glucose 116, calcium adjusted for mild type of anemia noted to be 9.8, avidin 1.8.  Alkaline phosphatase 196, AST 51, ALT 33, total bilirubin 0.7.  Ammonia 39.  CBC notable for will with cell count 3800 compared to 7380 323, hemoglobin 12.0 compared to 12.1 on 06/08/2022, platelet count 139 compared to 138 on 05/29/2022.  Urinalysis notable for no white blood cells.  COVID-19 PCR negative.  Imaging and additional notable ED work-up: EKG shows atrial fibrillation with heart rate 104, nonspecific intraventricular conduction delay, no evidence of T wave or ST changes.  While in the ED, the following were administered: Chest x-ray shows moderate to large right-sided pleural effusion, with interval increase relative to plan films of the chest from 06/04/2022, as well as small to moderate left-sided pleural effusion with interval decrease in size, will noting that potential atelectasis versus infiltrate cannot be excluded.  CT head showed no evidence of acute intracranial process.  Subsequently, the patient was admitted for further evaluation management of acute encephalopathy, of unclear source.    Review of Systems: As per HPI otherwise 10 point review of systems negative.   Past Medical History:  Diagnosis Date   Chest pain    Diabetes mellitus    DM2 (diabetes mellitus, type 2) (HCC)    GERD (gastroesophageal reflux disease)    HLD (hyperlipidemia)    HTN (hypertension)    PAF (paroxysmal atrial fibrillation) (HCC)    Palpitations     Past Surgical History:  Procedure Laterality Date   APPENDECTOMY     CHOLECYSTECTOMY     COLONOSCOPY  Dr. Michail Sermon   COLONOSCOPY WITH PROPOFOL N/A 11/06/2018   Procedure: COLONOSCOPY WITH PROPOFOL;  Surgeon: Wilford Corner, MD;  Location: [facility] ENDOSCOPY;  Service: Endoscopy;  Laterality: N/A;   HERNIA REPAIR     LEG AMPUTATION Left [id number]   Dr. Kendell Bane   POLYPECTOMY   11/06/2018   Procedure: POLYPECTOMY;  Surgeon: Wilford Corner, MD;  Location: [facility] ENDOSCOPY;  Service: Endoscopy;;   SHOULDER SURGERY  [id number]   Dr. Meridee Score   TOTAL ANKLE ARTHROPLASTY  [id number]    Social History:  reports that he quit smoking about 40 years ago. His smoking use included cigarettes. He has never used smokeless tobacco. He reports current alcohol use. He reports that he does not use drugs.   No Known Allergies  Family History  Problem Relation Age of Onset   Heart disease Mother        Living   Stroke Father 103       Deceased   Diabetes Sister    Cancer Sister     Family history reviewed and not pertinent    Prior to Admission medications   Medication Sig Start Date End Date Taking? Authorizing Provider  acetaminophen (TYLENOL) 500 MG tablet Take 1 tablet (500 mg total) by mouth every 6 (six) hours as needed for moderate pain. 05/27/22   Ngetich, Dinah C, NP  amiodarone (PACERONE) 200 MG tablet Take 1 tablet (200 mg total) by mouth 2 (two) times daily. 05/10/22   Jerline Pain, MD  amitriptyline (ELAVIL) 50 MG tablet TAKE 1 TABLET BY MOUTH EVERYDAY AT BEDTIME Patient taking differently: Take by mouth at bedtime. TAKE 1 TABLET BY MOUTH EVERYDAY AT BEDTIME 02/11/21   Ngetich, Dinah C, NP  apixaban (ELIQUIS) 5 MG TABS tablet Take 1 tablet (5 mg total) by mouth 2 (two) times daily. 05/20/21   Ngetich, Dinah C, NP  atorvastatin (LIPITOR) 40 MG tablet Take 1 tablet (40 mg total) by mouth daily. 08/07/20   Ngetich, Dinah C, NP  b complex vitamins tablet Take 1 tablet by mouth daily.    [provider]  folic acid (FOLVITE) 1 MG tablet TAKE 1 TABLET BY MOUTH EVERY DAY Patient taking differently: Take 1 mg by mouth daily. 06/03/21   Ngetich, Dinah C, NP  gabapentin (NEURONTIN) 600 MG tablet Take 0.5 tablets (300 mg total) by mouth 3 (three) times daily. 06/10/22   Ghimire, Henreitta Leber, MD  ketoconazole (NIZORAL) 2 % cream Apply 1 fingertip amount to each foot daily.  05/21/21   Evelina Bucy, DPM  Menthol 5 % PTCH Apply 1 patch topically as needed (back pain).    [provider]  metoprolol succinate (TOPROL-XL) 25 MG 24 hr tablet Take 1 tablet (25 mg total) by mouth daily. Take with or immediately following a meal. 06/10/22   Ghimire, Henreitta Leber, MD  omeprazole (PRILOSEC) 20 MG capsule TAKE 1 CAPSULE BY MOUTH EVERY DAY Patient taking differently: Take 20 mg by mouth daily. 01/28/21   Ngetich, Nelda Bucks, NP  ONETOUCH VERIO test strip USE TO TEST BLOOD SUGAR TWICE DAILY DX: E11.42 04/24/19   Ngetich, Dinah C, NP  topiramate (TOPAMAX) 50 MG tablet TAKE 1 AND 1/2 TABLETS BY MOUTH DAILY Patient taking differently: Take 75 mg by mouth daily. TAKE 1 AND 1/2 TABLETS BY MOUTH DAILY 02/26/21   Ngetich, Dinah C, NP  vitamin B-12 (CYANOCOBALAMIN) 1000 MCG tablet Take 1,000 mcg by mouth daily.    [provider]  vitamin C (ASCORBIC ACID) 500  MG tablet Take 500 mg by mouth daily.     [provider]     Objective    Physical Exam: Vitals:   06/16/22 2215 06/16/22 2230 06/16/22 2245 06/16/22 2300  BP: 106/75 128/78 115/85 122/86  Pulse: 85 96 75 95  Resp: '16 16 18 15  '$ Temp:      TempSrc:      SpO2: 99% 100% 95% 100%  Weight:      Height:        General: appears to be stated age; somnolent, confused Skin: warm, dry, no rash Head:  AT/[place] Mouth:  Oral mucosa membranes appear moist, normal dentition Neck: supple; trachea midline Heart:  RRR; did not appreciate any M/R/G Lungs: CTAB, did not appreciate any wheezes, rales, or rhonchi Abdomen: + BS; soft, ND, NT Vascular: 2+ pedal pulses on right; 2+ radial pulses b/l Extremities: Status post left BKA noted no peripheral edema, no muscle wasting Neuro: In the setting of the patient's current mental status and associated inability to follow instructions, unable to perform full neurologic exam at this time.  As such, assessment of strength, sensation, and cranial nerves is limited at this  time. Patient noted to spontaneously move all 4 extremities. No tremors.      Labs on Admission: I have personally reviewed following labs and imaging studies  CBC: Recent Labs  Lab 06/16/22 1510  WBC 3.8*  NEUTROABS 2.5  HGB 12.0*  HCT 37.3*  MCV 107.5*  PLT 099*   Basic Metabolic Panel: Recent Labs  Lab 06/16/22 1510  NA 140  K 3.7  CL 110  CO2 26  GLUCOSE 116*  BUN 15  CREATININE 0.63  CALCIUM 8.1*   GFR: Estimated Creatinine Clearance: 89.3 mL/min (by C-G formula based on SCr of 0.63 mg/dL). Liver Function Tests: Recent Labs  Lab 06/16/22 1510  AST 51*  ALT 33  ALKPHOS 196*  BILITOT 0.7  PROT 5.4*  ALBUMIN 1.8*   No results for input(s): "LIPASE", "AMYLASE" in the last 168 hours. Recent Labs  Lab 06/16/22 1510  AMMONIA 39*   Coagulation Profile: No results for input(s): "INR", "PROTIME" in the last 168 hours. Cardiac Enzymes: No results for input(s): "CKTOTAL", "CKMB", "CKMBINDEX", "TROPONINI" in the last 168 hours. BNP (last 3 results) No results for input(s): "PROBNP" in the last 8760 hours. HbA1C: No results for input(s): "HGBA1C" in the last 72 hours. CBG: Recent Labs  Lab 06/09/22 2329 06/10/22 0324 06/10/22 0722 06/10/22 1129  GLUCAP 112* 78 70 130*   Lipid Profile: No results for input(s): "CHOL", "HDL", "LDLCALC", "TRIG", "CHOLHDL", "LDLDIRECT" in the last 72 hours. Thyroid Function Tests: No results for input(s): "TSH", "T4TOTAL", "FREET4", "T3FREE", "THYROIDAB" in the last 72 hours. Anemia Panel: No results for input(s): "VITAMINB12", "FOLATE", "FERRITIN", "TIBC", "IRON", "RETICCTPCT" in the last 72 hours. Urine analysis:    Component Value Date/Time   COLORURINE AMBER (A) 06/16/2022 2111   APPEARANCEUR HAZY (A) 06/16/2022 2111   LABSPEC 1.020 06/16/2022 2111   PHURINE 5.0 06/16/2022 2111   GLUCOSEU NEGATIVE 06/16/2022 2111   HGBUR NEGATIVE 06/16/2022 2111   BILIRUBINUR NEGATIVE 06/16/2022 2111   [place] NEGATIVE  06/16/2022 2111   PROTEINUR NEGATIVE 06/16/2022 2111   UROBILINOGEN 0.2 07/08/2014 0137   NITRITE NEGATIVE 06/16/2022 2111   LEUKOCYTESUR NEGATIVE 06/16/2022 2111    Radiological Exams on Admission: CT Head Wo Contrast  Result Date: 06/16/2022 CLINICAL DATA:  Mental status change, unknown cause EXAM: CT HEAD WITHOUT CONTRAST TECHNIQUE: Contiguous axial images were  obtained from the base of the skull through the vertex without intravenous contrast. RADIATION DOSE REDUCTION: This exam was performed according to the departmental dose-optimization program which includes automated exposure control, adjustment of the mA and/or kV according to patient size and/or use of iterative reconstruction technique. COMPARISON:  Head CT 06/03/2022 FINDINGS: Brain: Patient had difficulty tolerating the exam, there is moderate motion artifact. Allowing for this, no acute hemorrhage, infarct, hydrocephalus, subdural/extra-axial collection. Stable degree of atrophy and mild periventricular chronic small vessel ischemia Vascular: No hyperdense vessel or unexpected calcification. Skull base atherosclerosis. Skull: No fracture or focal lesion. Sinuses/Orbits: Chronic opacification of right side of sphenoid sinus. No acute findings. Other: None. IMPRESSION: 1. No acute intracranial abnormality. 2. Stable atrophy and chronic small vessel ischemia. Electronically Signed   By: Keith Rake M.D.   On: 06/16/2022 15:17   DG Chest Port 1 View  Result Date: 06/16/2022 CLINICAL DATA:  Chest pain EXAM: PORTABLE CHEST 1 VIEW COMPARISON:  Previous studies including the examination of 06/04/2022 FINDINGS: There is interval increase in amount of right pleural effusion with moderate to large effusion on the right side. There is interval decrease in amount of left pleural effusion. Evaluation of underlying lung fields is difficult due to the effusions. Central pulmonary vessels are prominent. There is no pneumothorax. IMPRESSION: There is  moderate to large right pleural effusion with interval increase. There is small to moderate left pleural effusion with possible decrease. Possibility of underlying atelectasis/pneumonia in the lower lung fields is not excluded. Electronically Signed   By: Elmer Picker M.D.   On: 06/16/2022 14:49     EKG: Independently reviewed, with result as described above.    Assessment/Plan   Principal Problem:   Acute encephalopathy Active Problems:   PAF (paroxysmal atrial fibrillation) (HCC)   HLD (hyperlipidemia)   Pancytopenia (HCC)   Pleural effusion on right      #) Acute encephalopathy: 1 to 2 days of confusion, of unclear etiology.  This is in proximity to recent hospitalization for acute toxic encephalopathy due to unintentional overdose.  Differential includes recurrence of toxic encephalopathy given inclusion of several outpatient medications with central acting implications, including amitriptyline, gabapentin, Topamax.  No overt evidence of underlying factious process at this time, including urinalysis that was inconsistent with UTI, although interval increase in right-sided pleural effusion on chest x-ray is noted, which could potentially be parapneumonic in nature. Will add on procalcitonin to further assess.  Will further assess for any additional metabolic contributions, with laboratory evaluation as below.  Of note, CT head showed no evidence of acute intracranial process.  Plan: Add on procalcitonin level.  CMP/CBC in the morning.  Check VBG in the context of patient being former smoker.  Check CPK, TSH, urinary drug screen, INR.  We will keep n.p.o. until patient is passed RN swallow screen.  Fall precautions ordered.  We will attempt to hold potential contributory central acting medications for now, holding home amitriptyline, gabapentin, Topamax, and omeprazole.        #) right pleural effusion interval increase in right-sided pleural effusion chest x-ray from  06/04/2022, now noted to be moderate to large in nature.  Unclear source, while noting concomitant decrease in size and interval relating to left-sided pleural effusion.  Patient is currently on 4 L nasal cannula, maintaining O2 sats in the high 90s, although baseline supplemental oxygen requirements are not entirely clear to me per initial chart review.   Plan: Monitor strict I's and O's and weights.  Discontinue existing IV fluids.  May consider IR consultation for diagnostic/therapeutic paracentesis.  Repeat CMP/CBC in the morning.  Check INR.  Add on procalcitonin level.          #) Acute pancytopenia: Presenting CBC reflects pancytopenia, of which only the leukopenia component is new.  No evidence of active bleed.  Unclear etiology leading to development of leukopenia.  We will continue infectious work-up, including for him negative for organisms, as further detailed below.   Plan: Repeat CBC with differential in the morning.  Add on procalcitonin level.  Check INR.          #) Paroxysmal atrial fibrillation: Documented history of such, In the setting of a CHA2DS2-VASc score of 4, there is an indication for the patient to be on chronic anticoagulation for thromboembolic prophylaxis. Consistent with this, the patient is chronically anticoagulated on Eliquis.  Outpatient medications also notable for metoprolol succinate and amiodarone.  Appears to be in rate controlled atrial fibrillation this evening.  Plan: We will plan to resume home beta-blocker, amiodarone, and Eliquis once patient is no longer n.p.o.  Monitor on telemetry.  Add on serum magnesium level.          #) GERD: Documented history of such, on omeprazole as an outpatient.  Plan: Holding home PPI in the setting of current n.p.o. status.           #) Hyperlipidemia: documented h/o such. On high intensity atorvastatin as outpatient.    Plan: Holding of statin for now in the setting of current n.p.o.  status.  Additionally, in context of presenting altered mental status, also add on CPK level.       DVT prophylaxis: SCD's   Code Status: Full code Family Communication: none Disposition Plan: Per Rounding Team Consults called: none;  Admission status: Inpatient    PLEASE NOTE THAT DRAGON DICTATION SOFTWARE WAS USED IN THE CONSTRUCTION OF THIS NOTE.   [place] DO [facility]  From [place]   06/16/2022, 11:20 PM

## 2022-06-16 NOTE — ED Triage Notes (Signed)
Pt BIB [facility]. Pt a resident at [facility]. EMS reports pt being "off" per health care workers. Pt alert to voice and oriented x 4. Pt wears continuous 02 at 4L.

## 2022-06-16 NOTE — ED Provider Notes (Signed)
[facility] EMERGENCY DEPARTMENT Provider Note   CSN: [id number] Arrival date & time: 06/16/22  1359     History {Add pertinent medical, surgical, social history, OB history to HPI:1} No chief complaint on file.   Darrell Horne is a 76 y.o. male.  76 year old male presents with altered mental status.  Patient comes from facility where he is coming from drug overdose several weeks ago.  Had blood work done today for an unknown reason at the facility but was sent here due to being altered.  Patient is on Eliquis for history of A-fib.  Upon arrival here, patient does awaken to voice.  He is oriented to person and place and time.  He has no acute complaints.       Home Medications Prior to Admission medications   Medication Sig Start Date End Date Taking? Authorizing Provider  acetaminophen (TYLENOL) 500 MG tablet Take 1 tablet (500 mg total) by mouth every 6 (six) hours as needed for moderate pain. 05/27/22   Ngetich, Dinah C, NP  amiodarone (PACERONE) 200 MG tablet Take 1 tablet (200 mg total) by mouth 2 (two) times daily. 05/10/22   Jerline Pain, MD  amitriptyline (ELAVIL) 50 MG tablet TAKE 1 TABLET BY MOUTH EVERYDAY AT BEDTIME Patient taking differently: Take by mouth at bedtime. TAKE 1 TABLET BY MOUTH EVERYDAY AT BEDTIME 02/11/21   Ngetich, Dinah C, NP  apixaban (ELIQUIS) 5 MG TABS tablet Take 1 tablet (5 mg total) by mouth 2 (two) times daily. 05/20/21   Ngetich, Dinah C, NP  atorvastatin (LIPITOR) 40 MG tablet Take 1 tablet (40 mg total) by mouth daily. 08/07/20   Ngetich, Dinah C, NP  b complex vitamins tablet Take 1 tablet by mouth daily.    [provider]  folic acid (FOLVITE) 1 MG tablet TAKE 1 TABLET BY MOUTH EVERY DAY Patient taking differently: Take 1 mg by mouth daily. 06/03/21   Ngetich, Dinah C, NP  gabapentin (NEURONTIN) 600 MG tablet Take 0.5 tablets (300 mg total) by mouth 3 (three) times daily. 06/10/22   Ghimire, Henreitta Leber, MD  ketoconazole  (NIZORAL) 2 % cream Apply 1 fingertip amount to each foot daily. 05/21/21   Evelina Bucy, DPM  Menthol 5 % PTCH Apply 1 patch topically as needed (back pain).    [provider]  metoprolol succinate (TOPROL-XL) 25 MG 24 hr tablet Take 1 tablet (25 mg total) by mouth daily. Take with or immediately following a meal. 06/10/22   Ghimire, Henreitta Leber, MD  omeprazole (PRILOSEC) 20 MG capsule TAKE 1 CAPSULE BY MOUTH EVERY DAY Patient taking differently: Take 20 mg by mouth daily. 01/28/21   Ngetich, Nelda Bucks, NP  ONETOUCH VERIO test strip USE TO TEST BLOOD SUGAR TWICE DAILY DX: E11.42 04/24/19   Ngetich, Dinah C, NP  topiramate (TOPAMAX) 50 MG tablet TAKE 1 AND 1/2 TABLETS BY MOUTH DAILY Patient taking differently: Take 75 mg by mouth daily. TAKE 1 AND 1/2 TABLETS BY MOUTH DAILY 02/26/21   Ngetich, Dinah C, NP  vitamin B-12 (CYANOCOBALAMIN) 1000 MCG tablet Take 1,000 mcg by mouth daily.    [provider]  vitamin C (ASCORBIC ACID) 500 MG tablet Take 500 mg by mouth daily.     [provider]      Allergies    Patient has no known allergies.    Review of Systems   Review of Systems  Unable to perform ROS: Mental status change    Physical  Exam Updated Vital Signs There were no vitals taken for this visit. Physical Exam Vitals and nursing note reviewed.  Constitutional:      General: He is not in acute distress.    Appearance: Normal appearance. He is well-developed. He is not toxic-appearing.  HENT:     Head: Normocephalic and atraumatic.  Eyes:     General: Lids are normal.     Conjunctiva/sclera: Conjunctivae normal.     Pupils: Pupils are equal, round, and reactive to light.  Neck:     Thyroid: No thyroid mass.     Trachea: No tracheal deviation.  Cardiovascular:     Rate and Rhythm: Normal rate and regular rhythm.     Heart sounds: Normal heart sounds. No murmur heard.    No gallop.  Pulmonary:     Effort: Pulmonary effort is normal. No respiratory  distress.     Breath sounds: Normal breath sounds. No stridor. No decreased breath sounds, wheezing, rhonchi or rales.  Abdominal:     General: There is no distension.     Palpations: Abdomen is soft.     Tenderness: There is no abdominal tenderness. There is no rebound.  Musculoskeletal:        General: No tenderness. Normal range of motion.     Cervical back: Normal range of motion and neck supple.  Skin:    General: Skin is warm and dry.     Findings: No abrasion or rash.  Neurological:     Mental Status: He is oriented to person, place, and time. He is lethargic.     GCS: GCS eye subscore is 4. GCS verbal subscore is 5. GCS motor subscore is 5.     Cranial Nerves: Cranial nerves 2-12 are intact. No cranial nerve deficit.     Sensory: No sensory deficit.     Motor: Weakness present.  Psychiatric:        Attention and Perception: Attention normal.        Mood and Affect: Affect is blunt.        Speech: Speech is delayed.        Behavior: Behavior is withdrawn.     ED Results / Procedures / Treatments   Labs (all labs ordered are listed, but only abnormal results are displayed) Labs Reviewed  CBC WITH DIFFERENTIAL/PLATELET  URINALYSIS, ROUTINE W REFLEX MICROSCOPIC  COMPREHENSIVE METABOLIC PANEL  AMMONIA    EKG None  Radiology No results found.  Procedures Procedures  {Document cardiac monitor, telemetry assessment procedure when appropriate:1}  Medications Ordered in ED Medications  lactated ringers infusion (has no administration in time range)    ED Course/ Medical Decision Making/ A&P                           Medical Decision Making Amount and/or Complexity of Data Reviewed Labs: ordered. Radiology: ordered.  Risk Prescription drug management.   ***  {Document critical care time when appropriate:1} {Document review of labs and clinical decision tools ie heart score, Chads2Vasc2 etc:1}  {Document your independent review of radiology images, and  any outside records:1} {Document your discussion with family members, caretakers, and with consultants:1} {Document social determinants of health affecting pt's care:1} {Document your decision making why or why not admission, treatments were needed:1} Final Clinical Impression(s) / ED Diagnoses Final diagnoses:  None    Rx / DC Orders ED Discharge Orders     None

## 2022-06-17 ENCOUNTER — Inpatient Hospital Stay (HOSPITAL_COMMUNITY): Payer: Medicare Other

## 2022-06-17 DIAGNOSIS — G9340 Encephalopathy, unspecified: Secondary | ICD-10-CM | POA: Diagnosis not present

## 2022-06-17 DIAGNOSIS — J90 Pleural effusion, not elsewhere classified: Principal | ICD-10-CM

## 2022-06-17 DIAGNOSIS — D61818 Other pancytopenia: Secondary | ICD-10-CM | POA: Diagnosis present

## 2022-06-17 HISTORY — PX: IR THORACENTESIS ASP PLEURAL SPACE W/IMG GUIDE: IMG5380

## 2022-06-17 LAB — I-STAT VENOUS BLOOD GAS, ED
Acid-Base Excess: 0 mmol/L (ref 0.0–2.0)
Bicarbonate: 27.4 mmol/L (ref 20.0–28.0)
Calcium, Ion: 1.2 mmol/L (ref 1.15–1.40)
HCT: 34 % — ABNORMAL LOW (ref 39.0–52.0)
Hemoglobin: 11.6 g/dL — ABNORMAL LOW (ref 13.0–17.0)
O2 Saturation: 77 %
Potassium: 3.8 mmol/L (ref 3.5–5.1)
Sodium: 141 mmol/L (ref 135–145)
TCO2: 29 mmol/L (ref 22–32)
pCO2, Ven: 56.8 mmHg (ref 44–60)
pH, Ven: 7.291 (ref 7.25–7.43)
pO2, Ven: 47 mmHg — ABNORMAL HIGH (ref 32–45)

## 2022-06-17 LAB — LACTATE DEHYDROGENASE: LDH: 131 U/L (ref 98–192)

## 2022-06-17 LAB — LACTATE DEHYDROGENASE, PLEURAL OR PERITONEAL FLUID: LD, Fluid: 39 U/L — ABNORMAL HIGH (ref 3–23)

## 2022-06-17 LAB — BODY FLUID CELL COUNT WITH DIFFERENTIAL
Eos, Fluid: 1 %
Lymphs, Fluid: 46 %
Monocyte-Macrophage-Serous Fluid: 28 % — ABNORMAL LOW (ref 50–90)
Neutrophil Count, Fluid: 25 % (ref 0–25)
Total Nucleated Cell Count, Fluid: 64 cu mm (ref 0–1000)

## 2022-06-17 LAB — COMPREHENSIVE METABOLIC PANEL
ALT: 33 U/L (ref 0–44)
AST: 38 U/L (ref 15–41)
Albumin: 1.8 g/dL — ABNORMAL LOW (ref 3.5–5.0)
Alkaline Phosphatase: 176 U/L — ABNORMAL HIGH (ref 38–126)
Anion gap: 5 (ref 5–15)
BUN: 13 mg/dL (ref 8–23)
CO2: 23 mmol/L (ref 22–32)
Calcium: 8.2 mg/dL — ABNORMAL LOW (ref 8.9–10.3)
Chloride: 112 mmol/L — ABNORMAL HIGH (ref 98–111)
Creatinine, Ser: 0.56 mg/dL — ABNORMAL LOW (ref 0.61–1.24)
GFR, Estimated: >60 mL/min (ref >60)
Glucose, Bld: 83 mg/dL (ref 70–99)
Potassium: 4.2 mmol/L (ref 3.5–5.1)
Sodium: 140 mmol/L (ref 135–145)
Total Bilirubin: 0.8 mg/dL (ref 0.3–1.2)
Total Protein: 5.4 g/dL — ABNORMAL LOW (ref 6.5–8.1)

## 2022-06-17 LAB — MAGNESIUM
Magnesium: 1.8 mg/dL (ref 1.7–2.4)
Magnesium: 1.8 mg/dL (ref 1.7–2.4)

## 2022-06-17 LAB — CBC WITH DIFFERENTIAL/PLATELET
Abs Immature Granulocytes: 0.02 10*3/uL (ref 0.00–0.07)
Basophils Absolute: 0 10*3/uL (ref 0.0–0.1)
Basophils Relative: 0 %
Eosinophils Absolute: 0.2 10*3/uL (ref 0.0–0.5)
Eosinophils Relative: 5 %
HCT: 36.8 % — ABNORMAL LOW (ref 39.0–52.0)
Hemoglobin: 11.8 g/dL — ABNORMAL LOW (ref 13.0–17.0)
Immature Granulocytes: 0 %
Lymphocytes Relative: 26 %
Lymphs Abs: 1.4 10*3/uL (ref 0.7–4.0)
MCH: 35.1 pg — ABNORMAL HIGH (ref 26.0–34.0)
MCHC: 32.1 g/dL (ref 30.0–36.0)
MCV: 109.5 fL — ABNORMAL HIGH (ref 80.0–100.0)
Monocytes Absolute: 0.5 10*3/uL (ref 0.1–1.0)
Monocytes Relative: 11 %
Neutro Abs: 3 10*3/uL (ref 1.7–7.7)
Neutrophils Relative %: 58 %
Platelets: 163 10*3/uL (ref 150–400)
RBC: 3.36 MIL/uL — ABNORMAL LOW (ref 4.22–5.81)
RDW: 13.6 % (ref 11.5–15.5)
WBC: 5.1 10*3/uL (ref 4.0–10.5)
nRBC: 0 % (ref 0.0–0.2)

## 2022-06-17 LAB — ALBUMIN, PLEURAL OR PERITONEAL FLUID: Albumin, Fluid: <1.5 g/dL

## 2022-06-17 LAB — GRAM STAIN: Gram Stain: NONE SEEN

## 2022-06-17 LAB — TSH: TSH: 4.698 u[IU]/mL — ABNORMAL HIGH (ref 0.350–4.500)

## 2022-06-17 LAB — PROTEIN, PLEURAL OR PERITONEAL FLUID: Total protein, fluid: <3 g/dL

## 2022-06-17 LAB — PROCALCITONIN: Procalcitonin: <0.1 ng/mL

## 2022-06-17 LAB — CK: Total CK: 35 U/L — ABNORMAL LOW (ref 49–397)

## 2022-06-17 MED ORDER — MAGNESIUM SULFATE 2 GM/50ML IV SOLN
2.0000 g | Freq: Once | INTRAVENOUS | Status: AC
Start: 2022-06-17 — End: 2022-06-17
  Administered 2022-06-17: 2 g via INTRAVENOUS
  Filled 2022-06-17: qty 50

## 2022-06-17 MED ORDER — GABAPENTIN 600 MG PO TABS
300.0000 mg | ORAL_TABLET | Freq: Three times a day (TID) | ORAL | Status: DC
Start: 2022-06-17 — End: 2022-06-21
  Administered 2022-06-17 – 2022-06-21 (×11): 300 mg via ORAL
  Filled 2022-06-17 (×12): qty 1

## 2022-06-17 MED ORDER — AMIODARONE HCL 200 MG PO TABS
200.0000 mg | ORAL_TABLET | Freq: Two times a day (BID) | ORAL | Status: DC
Start: 2022-06-17 — End: 2022-06-22
  Administered 2022-06-17 – 2022-06-22 (×11): 200 mg via ORAL
  Filled 2022-06-17 (×11): qty 1

## 2022-06-17 MED ORDER — AMITRIPTYLINE HCL 25 MG PO TABS
50.0000 mg | ORAL_TABLET | Freq: Every day | ORAL | Status: DC
Start: 2022-06-17 — End: 2022-06-22
  Administered 2022-06-17 – 2022-06-21 (×5): 50 mg via ORAL
  Filled 2022-06-17 (×5): qty 2

## 2022-06-17 MED ORDER — POTASSIUM CHLORIDE CRYS ER 20 MEQ PO TBCR
40.0000 meq | EXTENDED_RELEASE_TABLET | Freq: Once | ORAL | Status: AC
Start: 2022-06-17 — End: 2022-06-17
  Administered 2022-06-17: 40 meq via ORAL
  Filled 2022-06-17: qty 2

## 2022-06-17 MED ORDER — LIDOCAINE HCL 1 % IJ SOLN
INTRAMUSCULAR | Status: AC
  Filled 2022-06-17: qty 20

## 2022-06-17 MED ORDER — METOPROLOL SUCCINATE ER 25 MG PO TB24
25.0000 mg | ORAL_TABLET | Freq: Every day | ORAL | Status: DC
Start: 2022-06-17 — End: 2022-06-22
  Administered 2022-06-17 – 2022-06-22 (×5): 25 mg via ORAL
  Filled 2022-06-17 (×6): qty 1

## 2022-06-17 MED ORDER — LIDOCAINE HCL (PF) 1 % IJ SOLN
INTRAMUSCULAR | Status: DC | PRN
  Administered 2022-06-17: 10 mL

## 2022-06-17 MED ORDER — TOPIRAMATE 25 MG PO TABS
75.0000 mg | ORAL_TABLET | Freq: Every day | ORAL | Status: DC
  Administered 2022-06-17 – 2022-06-22 (×6): 75 mg via ORAL
  Filled 2022-06-17 (×8): qty 3

## 2022-06-17 MED ORDER — FOLIC ACID 1 MG PO TABS
1.0000 mg | ORAL_TABLET | Freq: Every day | ORAL | Status: DC
Start: 2022-06-17 — End: 2022-06-22
  Administered 2022-06-17 – 2022-06-22 (×6): 1 mg via ORAL
  Filled 2022-06-17 (×7): qty 1

## 2022-06-17 MED ORDER — POLYETHYLENE GLYCOL 3350 17 G PO PACK
17.0000 g | PACK | Freq: Every day | ORAL | Status: DC
  Administered 2022-06-17 – 2022-06-22 (×5): 17 g via ORAL
  Filled 2022-06-17 (×5): qty 1

## 2022-06-17 MED ORDER — APIXABAN 5 MG PO TABS
5.0000 mg | ORAL_TABLET | Freq: Two times a day (BID) | ORAL | Status: DC
  Administered 2022-06-17 – 2022-06-22 (×11): 5 mg via ORAL
  Filled 2022-06-17 (×11): qty 1

## 2022-06-17 MED ORDER — ATORVASTATIN CALCIUM 40 MG PO TABS
40.0000 mg | ORAL_TABLET | Freq: Every day | ORAL | Status: DC
  Administered 2022-06-17 – 2022-06-22 (×6): 40 mg via ORAL
  Filled 2022-06-17 (×6): qty 1

## 2022-06-17 MED ORDER — MECLIZINE HCL 25 MG PO TABS: 25.0000 mg | ORAL_TABLET | ORAL | Status: DC | PRN
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
## 2022-06-17 NOTE — Progress Notes (Addendum)
PROGRESS NOTE    Darrell Horne  IRC:[id number] DOB: 01/17/1946 DOA: 06/16/2022 PCP: Sandrea Hughs, NP   Brief Narrative:  HPI: Darrell Horne is a 76 y.o. male with medical history significant for paroxysmal atrial fibrillation chronically anticoagulated on Eliquis, status post left BKA, GERD, essential hypertension, hyperlipidemia, who is admitted to [facility] on 06/16/2022 with acute encephalopathy after presenting from SNF to [facility] ED for evaluation of altered mental status.   In the context of the patient's altered mental status, the following history is provided via my discussions with the EDP as well as via chart review.   Patient was just hospitalized in the cath system from 06/03/2022 to 06/10/2022 for acute encephalopathy felt to be toxic in nature of the basis of unintentional overdose.  Mental status improved over course hospitalization, the patient was discharged to SNF, where staff noted him to exhibit evidence of new confusion over the course the last 1 to 2 days, prompting the patient brought to [facility] emergency department seen for further evaluation management thereof.   Medical history notable for type 2 diabetes mellitus, [place] lifestyle modifications in the setting of most recent hemoglobin A1c of 4.8% in July 2023.  This is complicated by diabetic peripheral polyneuropathy for which the patient is on gabapentin as well as Topamax.         ED Course:  Vital signs in the ED were notable for the following: Afebrile; heart rate 51-02; systolic blood pressures in the low 100s; respiratory rate 16-21, oxygen saturation 94 to 100% on 4 L nasal cannula, with unclear baseline supplemental oxygen requirements.   Labs were notable for the following: CMP notable for the following: Sodium 140, bicarbonate 26, creatinine 0.63, glucose 116, calcium adjusted for mild type of anemia noted to be 9.8, avidin 1.8.  Alkaline phosphatase 196, AST 51, ALT 33, total bilirubin 0.7.  Ammonia 39.   CBC notable for will with cell count 3800 compared to 7380 323, hemoglobin 12.0 compared to 12.1 on 06/08/2022, platelet count 139 compared to 138 on 05/29/2022.  Urinalysis notable for no white blood cells.  COVID-19 PCR negative.   Imaging and additional notable ED work-up: EKG shows atrial fibrillation with heart rate 104, nonspecific intraventricular conduction delay, no evidence of T wave or ST changes.   While in the ED, the following were administered: Chest x-ray shows moderate to large right-sided pleural effusion, with interval increase relative to plan films of the chest from 06/04/2022, as well as small to moderate left-sided pleural effusion with interval decrease in size, will noting that potential atelectasis versus infiltrate cannot be excluded.  CT head showed no evidence of acute intracranial process.   Subsequently, the patient was admitted for further evaluation management of acute encephalopathy, of unclear source  Assessment & Plan:   Principal Problem:   Acute encephalopathy Active Problems:   PAF (paroxysmal atrial fibrillation) (HCC)   HLD (hyperlipidemia)   Pancytopenia (HCC)   Pleural effusion on right  Acute encephalopathy: Could be hypoxic  vs metabolic.  Differential includes recurrence of toxic encephalopathy given inclusion of several outpatient medications with central acting implications, including amitriptyline, gabapentin, Topamax.  No other source identified.  Patient currently is fully alert and oriented.  Monitor closely.  Resume some of the medications.  Acute hypoxic respiratory failure secondary to moderate to large right pleural effusion: We will consult IR for diagnostic and therapeutic paracentesis and order specific labs.  Pneumonia ruled out: Procalcitonin unremarkable.  He is afebrile with  no leukocytosis.  Pneumonia ruled out.  Pancytopenia: Patient has chronic anemia and hemoglobin is stable.  Yesterday he had leukopenia and thrombocytopenia  which was likely reactive due to acute sickness and both of these have improved.  Now pancytopenia is resolved.  Paroxysmal atrial fibrillation: CHA2DS2-VASc score of 4.  Resume beta-blocker, amiodarone and Eliquis.  GERD: Will start on IV PPI.  Hyperlipidemia: Resume statin.   DVT prophylaxis: SCDs Start: 06/16/22 2317 on Eliquis   Code Status: Full Code  Family Communication:  None present at bedside.  Plan of care discussed with patient in length and he/she verbalized understanding and agreed with it.  Updated his wife over the phone as well.  Status is: Inpatient Remains inpatient appropriate because: Patient needs thoracentesis.   Estimated body mass index is 28.94 kg/m as calculated from the following:   Height as of this encounter: '5\' 10"'$  (1.778 m).   Weight as of this encounter: 91.5 kg.    Nutritional Assessment: Body mass index is 28.94 kg/m.Marland Kitchen Seen by dietician.  I agree with the assessment and plan as outlined below: Nutrition Status:        . Skin Assessment: I have examined the patient's skin and I agree with the wound assessment as performed by the wound care RN as outlined below:    Consultants:  None  Procedures:  None  Antimicrobials:  Anti-infectives (From admission, onward)    None         Subjective: Patient seen and examined, he is in the ED.  He has no complaints other than feeling lousy.  Mild shortness of breath only when I asked him.  Looks comfortable and he is fully alert and oriented.  Objective: Vitals:   06/17/22 0530 06/17/22 0600 06/17/22 0853 06/17/22 0900  BP: 97/65 109/66  109/80  Pulse: 88 88  83  Resp: '14 14  20  '$ Temp:   97.7 F (36.5 C)   TempSrc:   Oral   SpO2: 100% 100%  99%  Weight:      Height:       No intake or output data in the 24 hours ending 06/17/22 1001 Filed Weights   06/16/22 1453  Weight: 91.5 kg    Examination:  General exam: Appears calm and comfortable  Respiratory system: Diminished  breath sounds at the bases bilaterally and right middle lobe. Respiratory effort normal. Cardiovascular system: S1 & S2 heard, RRR. No JVD, murmurs, rubs, gallops or clicks.  Left BKA. Gastrointestinal system: Abdomen is nondistended, soft and nontender. No organomegaly or masses felt. Normal bowel sounds heard. Central nervous system: Alert and oriented. No focal neurological deficits. Extremities: Left BKA Psychiatry: Judgement and insight appear normal. Mood & affect appropriate.    Data Reviewed: I have personally reviewed following labs and imaging studies  CBC: Recent Labs  Lab 06/16/22 1510 06/17/22 0342 06/17/22 0937  WBC 3.8* 5.1  --   NEUTROABS 2.5 3.0  --   HGB 12.0* 11.8* 11.6*  HCT 37.3* 36.8* 34.0*  MCV 107.5* 109.5*  --   PLT 149* 163  --    Basic Metabolic Panel: Recent Labs  Lab 06/16/22 1510 06/17/22 0342 06/17/22 0937  NA 140 140 141  K 3.7 4.2 3.8  CL 110 112*  --   CO2 26 23  --   GLUCOSE 116* 83  --   BUN 15 13  --   CREATININE 0.63 0.56*  --   CALCIUM 8.1* 8.2*  --   MG 1.8 1.8  --  GFR: Estimated Creatinine Clearance: 89.3 mL/min (A) (by C-G formula based on SCr of 0.56 mg/dL (L)). Liver Function Tests: Recent Labs  Lab 06/16/22 1510 06/17/22 0342  AST 51* 38  ALT 33 33  ALKPHOS 196* 176*  BILITOT 0.7 0.8  PROT 5.4* 5.4*  ALBUMIN 1.8* 1.8*   No results for input(s): "LIPASE", "AMYLASE" in the last 168 hours. Recent Labs  Lab 06/16/22 1510  AMMONIA 39*   Coagulation Profile: No results for input(s): "INR", "PROTIME" in the last 168 hours. Cardiac Enzymes: No results for input(s): "CKTOTAL", "CKMB", "CKMBINDEX", "TROPONINI" in the last 168 hours. BNP (last 3 results) No results for input(s): "PROBNP" in the last 8760 hours. HbA1C: No results for input(s): "HGBA1C" in the last 72 hours. CBG: Recent Labs  Lab 06/10/22 1129  GLUCAP 130*   Lipid Profile: No results for input(s): "CHOL", "HDL", "LDLCALC", "TRIG", "CHOLHDL",  "LDLDIRECT" in the last 72 hours. Thyroid Function Tests: No results for input(s): "TSH", "T4TOTAL", "FREET4", "T3FREE", "THYROIDAB" in the last 72 hours. Anemia Panel: No results for input(s): "VITAMINB12", "FOLATE", "FERRITIN", "TIBC", "IRON", "RETICCTPCT" in the last 72 hours. Sepsis Labs: Recent Labs  Lab 06/16/22 1510  PROCALCITON <0.10    Recent Results (from the past 240 hour(s))  SARS Coronavirus 2 by RT PCR (hospital order, performed in [facility] hospital lab) *cepheid single result test* Anterior Nasal Swab     Status: None   Collection Time: 06/16/22  3:10 PM   Specimen: Anterior Nasal Swab  Result Value Ref Range Status   SARS Coronavirus 2 by RT PCR NEGATIVE NEGATIVE Final    Comment: (NOTE) SARS-CoV-2 target nucleic acids are NOT DETECTED.  The SARS-CoV-2 RNA is generally detectable in upper and lower respiratory specimens during the acute phase of infection. The lowest concentration of SARS-CoV-2 viral copies this assay can detect is 250 copies / mL. A negative result does not preclude SARS-CoV-2 infection and should not be used as the sole basis for treatment or other patient management decisions.  A negative result may occur with improper specimen collection / handling, submission of specimen other than nasopharyngeal swab, presence of viral mutation(s) within the areas targeted by this assay, and inadequate number of viral copies (<250 copies / mL). A negative result must be combined with clinical observations, patient history, and epidemiological information.  Fact Sheet for Patients:   https://www.patel.info/  Fact Sheet for Healthcare Providers: https://hall.com/  This test is not yet approved or  cleared by the [place] FDA and has been authorized for detection and/or diagnosis of SARS-CoV-2 by FDA under an Emergency Use Authorization (EUA).  This EUA will remain in effect (meaning this test can be used)  for the duration of the COVID-19 declaration under Section 564(b)(1) of the Act, 21 U.S.C. section 360bbb-3(b)(1), unless the authorization is terminated or revoked sooner.  Performed at [facility], [place] [address], [place], [place] [address]      Radiology Studies: CT Head Wo Contrast  Result Date: 06/16/2022 CLINICAL DATA:  Mental status change, unknown cause EXAM: CT HEAD WITHOUT CONTRAST TECHNIQUE: Contiguous axial images were obtained from the base of the skull through the vertex without intravenous contrast. RADIATION DOSE REDUCTION: This exam was performed according to the departmental dose-optimization program which includes automated exposure control, adjustment of the mA and/or kV according to patient size and/or use of iterative reconstruction technique. COMPARISON:  Head CT 06/03/2022 FINDINGS: Brain: Patient had difficulty tolerating the exam, there is moderate motion artifact. Allowing for this,  no acute hemorrhage, infarct, hydrocephalus, subdural/extra-axial collection. Stable degree of atrophy and mild periventricular chronic small vessel ischemia Vascular: No hyperdense vessel or unexpected calcification. Skull base atherosclerosis. Skull: No fracture or focal lesion. Sinuses/Orbits: Chronic opacification of right side of sphenoid sinus. No acute findings. Other: None. IMPRESSION: 1. No acute intracranial abnormality. 2. Stable atrophy and chronic small vessel ischemia. Electronically Signed   By: Keith Rake M.D.   On: 06/16/2022 15:17   DG Chest Port 1 View  Result Date: 06/16/2022 CLINICAL DATA:  Chest pain EXAM: PORTABLE CHEST 1 VIEW COMPARISON:  Previous studies including the examination of 06/04/2022 FINDINGS: There is interval increase in amount of right pleural effusion with moderate to large effusion on the right side. There is interval decrease in amount of left pleural effusion. Evaluation of underlying lung fields is difficult due to the effusions. Central  pulmonary vessels are prominent. There is no pneumothorax. IMPRESSION: There is moderate to large right pleural effusion with interval increase. There is small to moderate left pleural effusion with possible decrease. Possibility of underlying atelectasis/pneumonia in the lower lung fields is not excluded. Electronically Signed   By: Elmer Picker M.D.   On: 06/16/2022 14:49    Scheduled Meds: Continuous Infusions:   LOS: 1 day   Darliss Cheney, MD [facility]  06/17/2022, 10:01 AM   *Please note that this is a verbal dictation therefore any spelling or grammatical errors are due to the "[place] [employer]" system interpretation.  Please page via [place] and do not message via secure chat for urgent patient care matters. Secure chat can be used for non urgent patient care matters.  How to contact the [facility] Attending or Consulting provider [place] or covering provider during after hours [place], for this patient?  Check the care team in [facility] and look for a) attending/consulting [facility] provider listed and b) the [facility] team listed. Page or secure chat [phone]. Log into [email or web address] and use [place]'s universal password to access. If you do not have the password, please contact the hospital operator. Locate the [facility] provider you are looking for under [facility] and page to a number that you can be directly reached. If you still have difficulty reaching the provider, please page the [facility] (Director on Call) for the Hospitalists listed on amion for assistance.

## 2022-06-17 NOTE — ED Notes (Signed)
Pt passed swallow screen, MD Howerter notified.

## 2022-06-17 NOTE — ED Notes (Signed)
Patient returned from IR

## 2022-06-17 NOTE — Procedures (Signed)
PROCEDURE SUMMARY:  Successful US guided right diagnostic and therapeutic thoracentesis. Yielded 1.2 liters of yellow fluid. Pt tolerated procedure well.  Procedure was stopped when patient developed cough.  No immediate complications.  Specimen was sent for labs. CXR ordered.  EBL < 5 mL  Docia Barrier PA-C 06/17/2022 1:12 PM

## 2022-06-17 NOTE — ED Notes (Signed)
Called [phone] requesting purple man assignment

## 2022-06-17 NOTE — ED Notes (Signed)
Patient transported to IR 

## 2022-06-18 ENCOUNTER — Other Ambulatory Visit: Payer: Self-pay

## 2022-06-18 DIAGNOSIS — L89322 Pressure ulcer of left buttock, stage 2: Secondary | ICD-10-CM

## 2022-06-18 DIAGNOSIS — E782 Mixed hyperlipidemia: Secondary | ICD-10-CM | POA: Diagnosis not present

## 2022-06-18 DIAGNOSIS — L8990 Pressure ulcer of unspecified site, unspecified stage: Secondary | ICD-10-CM | POA: Insufficient documentation

## 2022-06-18 DIAGNOSIS — I480 Paroxysmal atrial fibrillation: Secondary | ICD-10-CM | POA: Diagnosis not present

## 2022-06-18 DIAGNOSIS — G9340 Encephalopathy, unspecified: Secondary | ICD-10-CM | POA: Diagnosis not present

## 2022-06-18 DIAGNOSIS — J90 Pleural effusion, not elsewhere classified: Secondary | ICD-10-CM | POA: Diagnosis not present

## 2022-06-18 LAB — CBC WITH DIFFERENTIAL/PLATELET
Abs Immature Granulocytes: 0.03 10*3/uL (ref 0.00–0.07)
Basophils Absolute: 0 10*3/uL (ref 0.0–0.1)
Basophils Relative: 0 %
Eosinophils Absolute: 0.2 10*3/uL (ref 0.0–0.5)
Eosinophils Relative: 2 %
HCT: 37 % — ABNORMAL LOW (ref 39.0–52.0)
Hemoglobin: 12.3 g/dL — ABNORMAL LOW (ref 13.0–17.0)
Immature Granulocytes: 0 %
Lymphocytes Relative: 18 %
Lymphs Abs: 1.5 10*3/uL (ref 0.7–4.0)
MCH: 34.2 pg — ABNORMAL HIGH (ref 26.0–34.0)
MCHC: 33.2 g/dL (ref 30.0–36.0)
MCV: 102.8 fL — ABNORMAL HIGH (ref 80.0–100.0)
Monocytes Absolute: 0.8 10*3/uL (ref 0.1–1.0)
Monocytes Relative: 9 %
Neutro Abs: 5.8 10*3/uL (ref 1.7–7.7)
Neutrophils Relative %: 71 %
Platelets: 175 10*3/uL (ref 150–400)
RBC: 3.6 MIL/uL — ABNORMAL LOW (ref 4.22–5.81)
RDW: 13.6 % (ref 11.5–15.5)
WBC: 8.4 10*3/uL (ref 4.0–10.5)
nRBC: 0 % (ref 0.0–0.2)

## 2022-06-18 LAB — COMPREHENSIVE METABOLIC PANEL
ALT: 25 U/L (ref 0–44)
AST: 29 U/L (ref 15–41)
Albumin: 1.8 g/dL — ABNORMAL LOW (ref 3.5–5.0)
Alkaline Phosphatase: 166 U/L — ABNORMAL HIGH (ref 38–126)
Anion gap: 5 (ref 5–15)
BUN: 12 mg/dL (ref 8–23)
CO2: 22 mmol/L (ref 22–32)
Calcium: 8.1 mg/dL — ABNORMAL LOW (ref 8.9–10.3)
Chloride: 110 mmol/L (ref 98–111)
Creatinine, Ser: 0.61 mg/dL (ref 0.61–1.24)
GFR, Estimated: >60 mL/min (ref >60)
Glucose, Bld: 83 mg/dL (ref 70–99)
Potassium: 4 mmol/L (ref 3.5–5.1)
Sodium: 137 mmol/L (ref 135–145)
Total Bilirubin: 0.8 mg/dL (ref 0.3–1.2)
Total Protein: 5.3 g/dL — ABNORMAL LOW (ref 6.5–8.1)

## 2022-06-18 MED ORDER — FUROSEMIDE 10 MG/ML IJ SOLN
20.0000 mg | Freq: Once | INTRAMUSCULAR | Status: AC
  Administered 2022-06-18: 20 mg via INTRAVENOUS
  Filled 2022-06-18: qty 4

## 2022-06-18 NOTE — Progress Notes (Signed)
PROGRESS NOTE    Darrell Horne  XNA:[id number] DOB: 30-Oct-1945 DOA: 06/16/2022 PCP: Sandrea Hughs, NP   Brief Narrative:  HPI: Darrell Horne is a 76 y.o. male with medical history significant for paroxysmal atrial fibrillation chronically anticoagulated on Eliquis, status post left BKA, GERD, essential hypertension, hyperlipidemia, who is admitted to [facility] on 06/16/2022 with acute encephalopathy after presenting from SNF to [facility] ED for evaluation of altered mental status.   In the context of the patient's altered mental status, the following history is provided via my discussions with the EDP as well as via chart review.   Patient was just hospitalized in the cath system from 06/03/2022 to 06/10/2022 for acute encephalopathy felt to be toxic in nature of the basis of unintentional overdose.  Mental status improved over course hospitalization, the patient was discharged to SNF, where staff noted him to exhibit evidence of new confusion over the course the last 1 to 2 days, prompting the patient brought to [facility] emergency department seen for further evaluation management thereof.   Medical history notable for type 2 diabetes mellitus, [place] lifestyle modifications in the setting of most recent hemoglobin A1c of 4.8% in July 2023.  This is complicated by diabetic peripheral polyneuropathy for which the patient is on gabapentin as well as Topamax.         ED Course:  Vital signs in the ED were notable for the following: Afebrile; heart rate 54-27; systolic blood pressures in the low 100s; respiratory rate 16-21, oxygen saturation 94 to 100% on 4 L nasal cannula, with unclear baseline supplemental oxygen requirements.   Labs were notable for the following: CMP notable for the following: Sodium 140, bicarbonate 26, creatinine 0.63, glucose 116, calcium adjusted for mild type of anemia noted to be 9.8, avidin 1.8.  Alkaline phosphatase 196, AST 51, ALT 33, total bilirubin 0.7.  Ammonia 39.   CBC notable for will with cell count 3800 compared to 7380 323, hemoglobin 12.0 compared to 12.1 on 06/08/2022, platelet count 139 compared to 138 on 05/29/2022.  Urinalysis notable for no white blood cells.  COVID-19 PCR negative.   Imaging and additional notable ED work-up: EKG shows atrial fibrillation with heart rate 104, nonspecific intraventricular conduction delay, no evidence of T wave or ST changes.   While in the ED, the following were administered: Chest x-ray shows moderate to large right-sided pleural effusion, with interval increase relative to plan films of the chest from 06/04/2022, as well as small to moderate left-sided pleural effusion with interval decrease in size, will noting that potential atelectasis versus infiltrate cannot be excluded.  CT head showed no evidence of acute intracranial process.   Subsequently, the patient was admitted for further evaluation management of acute encephalopathy, of unclear source  Assessment & Plan:   Principal Problem:   Acute encephalopathy Active Problems:   PAF (paroxysmal atrial fibrillation) (HCC)   HLD (hyperlipidemia)   Pancytopenia (HCC)   Pleural effusion on right   Pressure injury of skin  Acute encephalopathy: Could be hypoxic  vs metabolic.  Differential includes recurrence of toxic encephalopathy given inclusion of several outpatient medications with central acting implications, including amitriptyline, gabapentin, Topamax.  No other source identified.  Patient currently is fully alert and oriented; following commands appropriately.  -Continue supportive care and constant reorientation. -Altered mental status most likely trigger by hypoxia.  Acute hypoxic respiratory failure secondary to moderate to large right pleural effusion:  -IR consulted and patient is status post thoracentesis -  Follow-up final results of pleural fluid  -Lasix 20 mg IV x1 will be given -Continue to follow low-sodium diet.    Pneumonia ruled out:  Procalcitonin unremarkable.  He is afebrile with no leukocytosis.  Pneumonia ruled out. -Patient reports no known fevers or productive cough.  Pancytopenia: Patient has chronic anemia and hemoglobin is stable.  Yesterday he had leukopenia and thrombocytopenia which was likely reactive due to acute sickness and both of these have improved.  Now pancytopenia is resolved.  Paroxysmal atrial fibrillation:  -CHA2DS2-VASc score of 4.   -Continue beta-blocker, amiodarone and Eliquis -One-time dose Lasix 20 mg IV will be given given decreased breath sounds at the bases fine crackles.  GERD -continue PPI  Pressure injury: -Present at time of admission -Stage II left buttocks medial aspect. -No signs of superimposed infection -Continue constant repositioning  Hyperlipidemia:  -Continue statin.  DVT prophylaxis: SCDs Start: 06/16/22 2317 on Eliquis   Code Status: Full Code  Family Communication:  None present at bedside.  Plan of care discussed with patient in length and he/she verbalized understanding and agreed with it.  Updated his wife over the phone as well.  Status is: Inpatient Remains inpatient appropriate because: Patient needs thoracentesis.   Estimated body mass index is 28.94 kg/m as calculated from the following:   Height as of this encounter: '5\' 10"'$  (1.778 m).   Weight as of this encounter: 91.5 kg.  Pressure Injury 06/18/22 Buttocks Left;Medial Stage 2 -  Partial thickness loss of dermis presenting as a shallow open injury with a red, pink wound bed without slough. blister that burst (Active)  06/18/22 0700  Location: Buttocks  Location Orientation: Left;Medial  Staging: Stage 2 -  Partial thickness loss of dermis presenting as a shallow open injury with a red, pink wound bed without slough.  Wound Description (Comments): blister that burst  Present on Admission: Yes   Nutritional Assessment: Body mass index is 28.94 kg/m.Marland Kitchen Seen by dietician.  I agree with the  assessment and plan as outlined below: Nutrition Status:      . Skin Assessment: I have examined the patient's skin and I agree with the wound assessment as performed by the wound care RN as outlined below: Pressure Injury 06/18/22 Buttocks Left;Medial Stage 2 -  Partial thickness loss of dermis presenting as a shallow open injury with a red, pink wound bed without slough. blister that burst (Active)  06/18/22 0700  Location: Buttocks  Location Orientation: Left;Medial  Staging: Stage 2 -  Partial thickness loss of dermis presenting as a shallow open injury with a red, pink wound bed without slough.  Wound Description (Comments): blister that burst  Present on Admission: Yes    Consultants:  IR  Procedures:  Status post left thoracentesis 06/17/2022.  Antimicrobials:  Anti-infectives (From admission, onward)    None         Subjective: Demonstrating mild difficulty speaking in full sentences Nasal cannula supplementation.  No chest pain, no nausea, no vomiting.  Per family at bedside patient appears to be slightly more somnolent.  Objective: Vitals:   06/18/22 1018 06/18/22 1145 06/18/22 1200 06/18/22 1551  BP: 112/60 91/66 (!) 102/54 (!) 101/58  Pulse: 82 76  67  Resp:  17  18  Temp:  97.6 F (36.4 C)  97.6 F (36.4 C)  TempSrc:  Oral  Oral  SpO2:  100%  100%  Weight:      Height:        Intake/Output Summary (Last 24  hours) at 06/18/2022 1702 Last data filed at 06/18/2022 1146 Gross per 24 hour  Intake --  Output 550 ml  Net -550 ml   Filed Weights   06/16/22 1453  Weight: 91.5 kg    Examination: General exam: Alert, awake, oriented x 3; appears to be calm and comfortable.  Slightly more lethargic according to family member at bedside.  No chest pain, no.  Generally weak.  3 L nasal cannula supplementation in place.  Speaking in full sentences appreciated. Respiratory system: Decreased breath sounds at the bases; positive scattered.  No using accessory  muscles.  3 L nasal cannula supplementation in place. Cardiovascular system:RRR. No rubs or gallop; no JVD on exam. Gastrointestinal system: Abdomen is nondistended, soft and nontender. No organomegaly or masses felt. Normal bowel sounds heard. Central nervous system: Alert and oriented. No focal neurological deficits. Extremities: No cyanosis or clubbing.  Left BKA appreciated.  1-2+ edema on his right lower extremity extending all the way to his thighs. Skin: No petechiae.  Left buttocks stage II pressure injury medial aspect.  No signs of superimposed infection appreciated.  Present at time of admission. Psychiatry: Judgement and insight appear normal.  Flat affect.  Data Reviewed: I have personally reviewed following labs and imaging studies  CBC: Recent Labs  Lab 06/16/22 1510 06/17/22 0342 06/17/22 0937 06/18/22 0820  WBC 3.8* 5.1  --  8.4  NEUTROABS 2.5 3.0  --  5.8  HGB 12.0* 11.8* 11.6* 12.3*  HCT 37.3* 36.8* 34.0* 37.0*  MCV 107.5* 109.5*  --  102.8*  PLT 149* 163  --  161   Basic Metabolic Panel: Recent Labs  Lab 06/16/22 1510 06/17/22 0342 06/17/22 0937 06/18/22 0820  NA 140 140 141 137  K 3.7 4.2 3.8 4.0  CL 110 112*  --  110  CO2 26 23  --  22  GLUCOSE 116* 83  --  83  BUN 15 13  --  12  CREATININE 0.63 0.56*  --  0.61  CALCIUM 8.1* 8.2*  --  8.1*  MG 1.8 1.8  --   --    GFR: Estimated Creatinine Clearance: 89.3 mL/min (by C-G formula based on SCr of 0.61 mg/dL).  Liver Function Tests: Recent Labs  Lab 06/16/22 1510 06/17/22 0342 06/18/22 0820  AST 51* 38 29  ALT 33 33 25  ALKPHOS 196* 176* 166*  BILITOT 0.7 0.8 0.8  PROT 5.4* 5.4* 5.3*  ALBUMIN 1.8* 1.8* 1.8*   No results for input(s): "LIPASE", "AMYLASE" in the last 168 hours.  Recent Labs  Lab 06/16/22 1510  AMMONIA 39*   Cardiac Enzymes: Recent Labs  Lab 06/17/22 0342  CKTOTAL 35*   Thyroid Function Tests: Recent Labs    06/17/22 0342  TSH 4.698*   Anemia Panel: No results  for input(s): "VITAMINB12", "FOLATE", "FERRITIN", "TIBC", "IRON", "RETICCTPCT" in the last 72 hours. Sepsis Labs: Recent Labs  Lab 06/16/22 1510  PROCALCITON <0.10    Recent Results (from the past 240 hour(s))  SARS Coronavirus 2 by RT PCR (hospital order, performed in [facility] hospital lab) *cepheid single result test* Anterior Nasal Swab     Status: None   Collection Time: 06/16/22  3:10 PM   Specimen: Anterior Nasal Swab  Result Value Ref Range Status   SARS Coronavirus 2 by RT PCR NEGATIVE NEGATIVE Final    Comment: (NOTE) SARS-CoV-2 target nucleic acids are NOT DETECTED.  The SARS-CoV-2 RNA is generally detectable in upper and lower respiratory specimens during  the acute phase of infection. The lowest concentration of SARS-CoV-2 viral copies this assay can detect is 250 copies / mL. A negative result does not preclude SARS-CoV-2 infection and should not be used as the sole basis for treatment or other patient management decisions.  A negative result may occur with improper specimen collection / handling, submission of specimen other than nasopharyngeal swab, presence of viral mutation(s) within the areas targeted by this assay, and inadequate number of viral copies (<250 copies / mL). A negative result must be combined with clinical observations, patient history, and epidemiological information.  Fact Sheet for Patients:   https://www.patel.info/  Fact Sheet for Healthcare Providers: https://hall.com/  This test is not yet approved or  cleared by the [place] FDA and has been authorized for detection and/or diagnosis of SARS-CoV-2 by FDA under an Emergency Use Authorization (EUA).  This EUA will remain in effect (meaning this test can be used) for the duration of the COVID-19 declaration under Section 564(b)(1) of the Act, 21 U.S.C. section 360bbb-3(b)(1), unless the authorization is terminated or revoked  sooner.  Performed at [facility], [place] [address] [place], [place] [address]   Gram stain     Status: None   Collection Time: 06/17/22 12:59 PM   Specimen: Lung, Right; Pleural Fluid  Result Value Ref Range Status   Specimen Description PLEURAL  Final   Special Requests  LUNG, RIGHT  Final   Gram Stain   Final    NO WBC SEEN NO ORGANISMS SEEN Performed at [facility], [address], [place], [place] [address]    Report Status 06/17/2022 FINAL  Final  Culture, body fluid w Gram Stain-bottle     Status: None (Preliminary result)   Collection Time: 06/17/22 12:59 PM   Specimen: Pleura  Result Value Ref Range Status   Specimen Description PLEURAL  Final   Special Requests  LUNG, RIGHT  Final   Culture   Final    NO GROWTH < 24 HOURS Performed at [facility], [place] [address], [place], [place] [address]    Report Status PENDING  Incomplete     Radiology Studies: IR THORACENTESIS ASP PLEURAL SPACE W/IMG GUIDE  Result Date: 06/17/2022 INDICATION: Patient with history of right pleural effusion. Request made for right thoracentesis. EXAM: ULTRASOUND GUIDED DIAGNOSTIC AND THERAPEUTIC RIGHT THORACENTESIS MEDICATIONS: 10 mL 1% lidocaine COMPLICATIONS: None immediate. PROCEDURE: An ultrasound guided thoracentesis was thoroughly discussed with the patient and questions answered. The benefits, risks, alternatives and complications were also discussed. The patient understands and wishes to proceed with the procedure. Written consent was obtained. Ultrasound was performed to localize and mark an adequate pocket of fluid in the right chest. The area was then prepped and draped in the normal sterile fashion. 1% Lidocaine was used for local anesthesia. Under ultrasound guidance a 6 Fr Safe-T-Centesis catheter was introduced. Thoracentesis was performed. The catheter was removed and a dressing applied. FINDINGS: A total of approximately 1.2 liters of yellow fluid was removed. Samples  were sent to the laboratory as requested by the clinical team. IMPRESSION: Successful ultrasound guided right thoracentesis yielding 1.2 liters of pleural fluid. Read by: Brynda Greathouse PA-C Electronically Signed   By: Miachel Roux M.D.   On: 06/17/2022 14:34   DG Chest 1 View  Result Date: 06/17/2022 CLINICAL DATA:  Post right thoracentesis with 1.3 L removed. EXAM: CHEST  1 VIEW COMPARISON:  06/16/2022 FINDINGS: Lungs are hypoinflated and demonstrate mild interval worsening of  a moderate size left effusion likely with associated basilar atelectasis. Mild interval improvement and right effusion post thoracentesis. Residual small to moderate right effusion likely with associated basilar atelectasis is present. No evidence of right-sided pneumothorax. Mild hazy perihilar opacification likely interstitial edema. Remainder of the exam is unchanged. IMPRESSION: 1. No pneumothorax post right thoracentesis. Residual small to moderate right effusion likely with associated basilar atelectasis. Slight interval worsening of moderate size left effusion likely with associated basilar atelectasis. 2. Suggestion of mild interstitial edema. Electronically Signed   By: Marin Olp M.D.   On: 06/17/2022 13:25    Scheduled Meds:  amiodarone  200 mg Oral BID   amitriptyline  50 mg Oral QHS   apixaban  5 mg Oral BID   atorvastatin  40 mg Oral Daily   folic acid  1 mg Oral Daily   gabapentin  300 mg Oral TID   metoprolol succinate  25 mg Oral Daily   polyethylene glycol  17 g Oral Daily   topiramate  75 mg Oral Daily   Continuous Infusions:   LOS: 2 days   Barton Dubois, MD [facility]  06/18/2022,   *Please note that this is a verbal dictation therefore any spelling or grammatical errors are due to the "[place]" system interpretation.  Please page via [place] and do not message via secure chat for urgent patient care matters. Secure chat can be used for non urgent patient care matters.  How  to contact the [facility] Attending or Consulting provider [place] or covering provider during after hours [place], for this patient?  Check the care team in [facility] and look for a) attending/consulting TR[facility] provider listed and b) the [facility] team listed. Page or secure chat [phone]. Log into [email or web address] and use [place]'s universal password to access. If you do not have the password, please contact the hospital operator. Locate the [facility] provider you are looking for under [facility] and page to a number that you can be directly reached. If you still have difficulty reaching the provider, please page the [facility] (Director on Call) for the Hospitalists listed on amion for assistance.

## 2022-06-18 NOTE — Plan of Care (Signed)
  Problem: Activity: Goal: Risk for activity intolerance will decrease Outcome: Progressing   Problem: Coping: Goal: Level of anxiety will decrease Outcome: Progressing   Problem: Safety: Goal: Ability to remain free from injury will improve Outcome: Progressing   

## 2022-06-19 ENCOUNTER — Other Ambulatory Visit (HOSPITAL_COMMUNITY): Payer: Medicare Other

## 2022-06-19 DIAGNOSIS — J90 Pleural effusion, not elsewhere classified: Secondary | ICD-10-CM | POA: Diagnosis not present

## 2022-06-19 DIAGNOSIS — E782 Mixed hyperlipidemia: Secondary | ICD-10-CM | POA: Diagnosis not present

## 2022-06-19 DIAGNOSIS — G9340 Encephalopathy, unspecified: Secondary | ICD-10-CM | POA: Diagnosis not present

## 2022-06-19 DIAGNOSIS — I480 Paroxysmal atrial fibrillation: Secondary | ICD-10-CM | POA: Diagnosis not present

## 2022-06-19 LAB — CBC
HCT: 40.9 % (ref 39.0–52.0)
Hemoglobin: 13.6 g/dL (ref 13.0–17.0)
MCH: 33.9 pg (ref 26.0–34.0)
MCHC: 33.3 g/dL (ref 30.0–36.0)
MCV: 102 fL — ABNORMAL HIGH (ref 80.0–100.0)
Platelets: 186 10*3/uL (ref 150–400)
RBC: 4.01 MIL/uL — ABNORMAL LOW (ref 4.22–5.81)
RDW: 13.6 % (ref 11.5–15.5)
WBC: 9.2 10*3/uL (ref 4.0–10.5)
nRBC: 0 % (ref 0.0–0.2)

## 2022-06-19 LAB — BASIC METABOLIC PANEL
Anion gap: 6 (ref 5–15)
BUN: 12 mg/dL (ref 8–23)
CO2: 23 mmol/L (ref 22–32)
Calcium: 8.3 mg/dL — ABNORMAL LOW (ref 8.9–10.3)
Chloride: 108 mmol/L (ref 98–111)
Creatinine, Ser: 0.66 mg/dL (ref 0.61–1.24)
GFR, Estimated: >60 mL/min (ref >60)
Glucose, Bld: 117 mg/dL — ABNORMAL HIGH (ref 70–99)
Potassium: 3.5 mmol/L (ref 3.5–5.1)
Sodium: 137 mmol/L (ref 135–145)

## 2022-06-19 LAB — BRAIN NATRIURETIC PEPTIDE: B Natriuretic Peptide: 298.9 pg/mL — ABNORMAL HIGH (ref 0.0–100.0)

## 2022-06-19 NOTE — Hospital Course (Signed)
Darrell Horne is a 76 y.o. male with medical history significant for paroxysmal atrial fibrillation chronically anticoagulated on Eliquis, status post left BKA, GERD, essential hypertension, hyperlipidemia, presented to hospital with altered mental status from skilled nursing facility.  Of note patient was recently hospitalized for encephalopathy which was thought to be secondary to unintentional overdose.  He was then discharged to skilled nursing facility but over the last 1 or 2 days has been having increasing confusion.  In the ED, patient was on nasal cannula oxygen saturating 94 to 100% on 4 L nasal cannula, with unclear baseline supplemental oxygen requirements.  Lab work was remarkable for hemoglobin of 12.0.  Ammonia was 39.  Total count was 3800.  Urinalysis did not show any white cells.  COVID-19 PCR was negative.  EKG showed atrial fibrillation.  Chest x-ray showed moderate to large right-sided pleural effusion with interval increase from 06/04/2022.  CT head scan was negative.  Patient was then considered for admission to hospital for further encephalopathy work-up.   Assessment and plan.  Acute  encephalopathy: Toxic metabolic in nature.  On several outpatient medications and could be medication induced.  Now fully oriented.    Acute hypoxic respiratory failure secondary to moderate to large right pleural effusion:  Patient underwent ultrasound-guided right sided diagnostic and therapeutic thoracentesis with removal of 1.2 L of yellow fluid on 06/27/2022.  Pleural fluid culture negative so far, Gram stain with no organisms.  Pneumonia was ruled out.  Procalcitonin was negative.  Patient is afebrile.   Pancytopenia: Patient has chronic anemia.  Improved at this time.  Paroxysmal atrial fibrillation:  -CHA2DS2-VASc score of 4.  On beta-blocker amiodarone and Eliquis.  Received 20 mg of IV Lasix yesterday.  GERD -continue PPI   Pressure injury: Present on admission.  Stage II left buttock  pressure ulceration.  Continue wound care.   Hyperlipidemia:  -Continue statin.  Debility, deconditioning.  Patient is currently from skilled nursing facility.

## 2022-06-19 NOTE — Progress Notes (Signed)
More irritable today.  Became very upset with this nurse when she was trying to get him to eat.  Said if it is so good for him or if I am so concerned then to eat it myself.  He said every time he goes to sleep, someone comes in and bothers him.  Darrell Horne he is ready to go back where he came from so he can rest.

## 2022-06-19 NOTE — Plan of Care (Signed)
  Problem: Activity: Goal: Risk for activity intolerance will decrease Outcome: Progressing   Problem: Safety: Goal: Ability to remain free from injury will improve Outcome: Progressing   Problem: Skin Integrity: Goal: Risk for impaired skin integrity will decrease Outcome: Progressing   

## 2022-06-19 NOTE — Progress Notes (Signed)
PROGRESS NOTE    Darrell Horne  OHY:[id number] DOB: [birth year] DOA: 06/16/2022 PCP: Sandrea Hughs, NP    Brief Narrative:  Darrell Horne is a 76 y.o. male with medical history significant for paroxysmal atrial fibrillation chronically anticoagulated on Eliquis, status post left BKA, GERD, essential hypertension, hyperlipidemia, presented to hospital with altered mental status from skilled nursing facility.  Of note patient was recently hospitalized for encephalopathy which was thought to be secondary to unintentional overdose.  He was then discharged to skilled nursing facility but over the last 1 or 2 days has been having increasing confusion.  In the ED, patient was on nasal cannula oxygen saturating 94 to 100% on 4 L nasal cannula, with unclear baseline supplemental oxygen requirements.  Lab work was remarkable for hemoglobin of 12.0.  Ammonia was 39.  Total count was 3800.  Urinalysis did not show any white cells.  COVID-19 PCR was negative.  EKG showed atrial fibrillation.  Chest x-ray showed moderate to large right-sided pleural effusion with interval increase from 06/04/2022.  CT head scan was negative.  Patient was then considered for admission to hospital for further encephalopathy work-up.   Assessment and plan.  Principal Problem:   Acute encephalopathy Active Problems:   PAF (paroxysmal atrial fibrillation) (HCC)   HLD (hyperlipidemia)   Pancytopenia (HCC)   Pleural effusion on right   Pressure injury of skin   Acute  encephalopathy: Toxic/ metabolic in nature.  Could be polypharmacy as well, was on several outpatient medications as outpatient.  Still altered and confused.  Hold off with sedative medications.   Acute hypoxic respiratory failure secondary to moderate to large right pleural effusion:  Patient underwent ultrasound-guided right sided diagnostic and therapeutic thoracentesis with removal of 1.2 L of yellow fluid on 06/27/2022.  Pleural fluid culture negative so far,  still on supplemental oxygen.  Gram stain with no organisms.  Pneumonia was ruled out.  Procalcitonin was negative.  Patient is afebrile.  Continue to wean off oxygen.  BNP elevated.  Check 2D echocardiogram.  Might benefit from addition of Lasix.   Pancytopenia: Patient has chronic anemia.  Improved at this time.  Paroxysmal atrial fibrillation:  -CHA2DS2-VASc score of 4.  On beta-blocker amiodarone and Eliquis.  Received 20 mg of IV Lasix yesterday.  GERD -continue PPI   Pressure injury: Present on admission.  Stage II left buttock pressure ulceration.  Continue wound care.   Hyperlipidemia:  -Continue statin.  Debility, deconditioning.  Patient is currently from skilled nursing facility.       DVT prophylaxis: SCDs Start: 06/16/22 2317 apixaban (ELIQUIS) tablet 5 mg   Code Status:     Code Status: Full Code  Disposition: Skilled nursing facility  Status is: Inpatient  Remains inpatient appropriate because: Encephalopathy,   Family Communication: None at bedside  Consultants:  Neurology  Procedures:  None  Antimicrobials:  None  Anti-infectives (From admission, onward)    None      Subjective: Today, patient was seen and examined at bedside.  Poor historian, repeating same words, denies pain,  Objective: Vitals:   06/19/22 0806 06/19/22 0837 06/19/22 1005 06/19/22 1124  BP:  (!) 101/56 104/70 101/62  Pulse: (!) 102 73  62  Resp: '16 18  18  '$ Temp: 98.7 F (37.1 C) 98.7 F (37.1 C)  98.5 F (36.9 C)  TempSrc: Axillary     SpO2: 98% 96%  99%  Weight:      Height:  Intake/Output Summary (Last 24 hours) at 06/19/2022 1141 Last data filed at 06/18/2022 2200 Gross per 24 hour  Intake 440 ml  Output 1850 ml  Net -1410 ml   Filed Weights   06/16/22 1453 06/19/22 0500  Weight: 91.5 kg 101 kg    Physical Examination: Body mass index is 31.95 kg/m.   General: Obese built, not in obvious distress, says yeah only, appears weak and  deconditioned, elderly male, on nasal cannula oxygen HENT:   No scleral pallor or icterus noted. Oral mucosa is moist.  Chest:  Clear breath sounds.  Diminished breath sounds bilaterally. No crackles or wheezes.  CVS: S1 &S2 heard. No murmur.  Regular rate and rhythm. Abdomen: Soft, nontender, nondistended.  Bowel sounds are heard.   Extremities: Left below-knee amputation, Psych: Awake on verbal command, repeats the same mood, appears confused and disoriented, CNS: Confused and disoriented, repeating the same word, moves extremities, Skin: Warm and dry.  No rashes noted.  Data Reviewed:   CBC: Recent Labs  Lab 06/16/22 1510 06/17/22 0342 06/17/22 0937 06/18/22 0820 06/19/22 0202  WBC 3.8* 5.1  --  8.4 9.2  NEUTROABS 2.5 3.0  --  5.8  --   HGB 12.0* 11.8* 11.6* 12.3* 13.6  HCT 37.3* 36.8* 34.0* 37.0* 40.9  MCV 107.5* 109.5*  --  102.8* 102.0*  PLT 149* 163  --  175 299    Basic Metabolic Panel: Recent Labs  Lab 06/16/22 1510 06/17/22 0342 06/17/22 0937 06/18/22 0820 06/19/22 0202  NA 140 140 141 137 137  K 3.7 4.2 3.8 4.0 3.5  CL 110 112*  --  110 108  CO2 26 23  --  22 23  GLUCOSE 116* 83  --  83 117*  BUN 15 13  --  12 12  CREATININE 0.63 0.56*  --  0.61 0.66  CALCIUM 8.1* 8.2*  --  8.1* 8.3*  MG 1.8 1.8  --   --   --     Liver Function Tests: Recent Labs  Lab 06/16/22 1510 06/17/22 0342 06/18/22 0820  AST 51* 38 29  ALT 33 33 25  ALKPHOS 196* 176* 166*  BILITOT 0.7 0.8 0.8  PROT 5.4* 5.4* 5.3*  ALBUMIN 1.8* 1.8* 1.8*     Radiology Studies: IR THORACENTESIS ASP PLEURAL SPACE W/IMG GUIDE  Result Date: 06/17/2022 INDICATION: Patient with history of right pleural effusion. Request made for right thoracentesis. EXAM: ULTRASOUND GUIDED DIAGNOSTIC AND THERAPEUTIC RIGHT THORACENTESIS MEDICATIONS: 10 mL 1% lidocaine COMPLICATIONS: None immediate. PROCEDURE: An ultrasound guided thoracentesis was thoroughly discussed with the patient and questions answered.  The benefits, risks, alternatives and complications were also discussed. The patient understands and wishes to proceed with the procedure. Written consent was obtained. Ultrasound was performed to localize and mark an adequate pocket of fluid in the right chest. The area was then prepped and draped in the normal sterile fashion. 1% Lidocaine was used for local anesthesia. Under ultrasound guidance a 6 Fr Safe-T-Centesis catheter was introduced. Thoracentesis was performed. The catheter was removed and a dressing applied. FINDINGS: A total of approximately 1.2 liters of yellow fluid was removed. Samples were sent to the laboratory as requested by the clinical team. IMPRESSION: Successful ultrasound guided right thoracentesis yielding 1.2 liters of pleural fluid. Read by: Brynda Greathouse PA-C Electronically Signed   By: Miachel Roux M.D.   On: 06/17/2022 14:34   DG Chest 1 View  Result Date: 06/17/2022 CLINICAL DATA:  Post right thoracentesis with 1.3 L removed.  EXAM: CHEST  1 VIEW COMPARISON:  06/16/2022 FINDINGS: Lungs are hypoinflated and demonstrate mild interval worsening of a moderate size left effusion likely with associated basilar atelectasis. Mild interval improvement and right effusion post thoracentesis. Residual small to moderate right effusion likely with associated basilar atelectasis is present. No evidence of right-sided pneumothorax. Mild hazy perihilar opacification likely interstitial edema. Remainder of the exam is unchanged. IMPRESSION: 1. No pneumothorax post right thoracentesis. Residual small to moderate right effusion likely with associated basilar atelectasis. Slight interval worsening of moderate size left effusion likely with associated basilar atelectasis. 2. Suggestion of mild interstitial edema. Electronically Signed   By: Marin Olp M.D.   On: 06/17/2022 13:25      LOS: 3 days    Flora Lipps, MD [facility] Available via Epic secure chat 7am-7pm After these  hours, please refer to coverage provider listed on [email or web address] 06/19/2022, 11:41 AM

## 2022-06-20 ENCOUNTER — Inpatient Hospital Stay (HOSPITAL_COMMUNITY): Payer: Medicare Other

## 2022-06-20 DIAGNOSIS — G9340 Encephalopathy, unspecified: Secondary | ICD-10-CM | POA: Diagnosis not present

## 2022-06-20 DIAGNOSIS — J918 Pleural effusion in other conditions classified elsewhere: Secondary | ICD-10-CM

## 2022-06-20 DIAGNOSIS — I340 Nonrheumatic mitral (valve) insufficiency: Secondary | ICD-10-CM | POA: Diagnosis not present

## 2022-06-20 DIAGNOSIS — I480 Paroxysmal atrial fibrillation: Secondary | ICD-10-CM | POA: Diagnosis not present

## 2022-06-20 DIAGNOSIS — E782 Mixed hyperlipidemia: Secondary | ICD-10-CM | POA: Diagnosis not present

## 2022-06-20 DIAGNOSIS — J90 Pleural effusion, not elsewhere classified: Secondary | ICD-10-CM | POA: Diagnosis not present

## 2022-06-20 LAB — BASIC METABOLIC PANEL
Anion gap: 3 — ABNORMAL LOW (ref 5–15)
BUN: 13 mg/dL (ref 8–23)
CO2: 25 mmol/L (ref 22–32)
Calcium: 8 mg/dL — ABNORMAL LOW (ref 8.9–10.3)
Chloride: 109 mmol/L (ref 98–111)
Creatinine, Ser: 0.59 mg/dL — ABNORMAL LOW (ref 0.61–1.24)
GFR, Estimated: >60 mL/min (ref >60)
Glucose, Bld: 78 mg/dL (ref 70–99)
Potassium: 3.2 mmol/L — ABNORMAL LOW (ref 3.5–5.1)
Sodium: 137 mmol/L (ref 135–145)

## 2022-06-20 LAB — CBC
HCT: 31 % — ABNORMAL LOW (ref 39.0–52.0)
Hemoglobin: 10.1 g/dL — ABNORMAL LOW (ref 13.0–17.0)
MCH: 33.2 pg (ref 26.0–34.0)
MCHC: 32.6 g/dL (ref 30.0–36.0)
MCV: 102 fL — ABNORMAL HIGH (ref 80.0–100.0)
Platelets: DECREASED 10*3/uL (ref 150–400)
RBC: 3.04 MIL/uL — ABNORMAL LOW (ref 4.22–5.81)
RDW: 13.6 % (ref 11.5–15.5)
WBC: 4.8 10*3/uL (ref 4.0–10.5)
nRBC: 0 % (ref 0.0–0.2)

## 2022-06-20 LAB — MAGNESIUM: Magnesium: 1.9 mg/dL (ref 1.7–2.4)

## 2022-06-20 LAB — ECHOCARDIOGRAM COMPLETE
AR max vel: 1.54 cm2
AV Peak grad: 6.5 mmHg
Ao pk vel: 1.27 m/s
Height: 70 in
MV M vel: 3.47 m/s
MV Peak grad: 48.2 mmHg
S' Lateral: 2.8 cm
Weight: 3506.2 oz

## 2022-06-20 LAB — GLUCOSE, CAPILLARY: Glucose-Capillary: 153 mg/dL — ABNORMAL HIGH (ref 70–99)

## 2022-06-20 MED ORDER — ALBUMIN HUMAN 25 % IV SOLN
12.5000 g | Freq: Once | INTRAVENOUS | Status: DC
Start: 2022-06-20 — End: 2022-06-20

## 2022-06-20 MED ORDER — POTASSIUM CHLORIDE CRYS ER 20 MEQ PO TBCR
40.0000 meq | EXTENDED_RELEASE_TABLET | Freq: Once | ORAL | Status: AC
  Administered 2022-06-20: 40 meq via ORAL
  Filled 2022-06-20: qty 2

## 2022-06-20 MED ORDER — ALBUMIN HUMAN 25 % IV SOLN
12.5000 g | Freq: Four times a day (QID) | INTRAVENOUS | Status: AC
  Administered 2022-06-20 – 2022-06-21 (×3): 12.5 g via INTRAVENOUS
  Filled 2022-06-20 (×3): qty 50

## 2022-06-20 NOTE — Evaluation (Signed)
Physical Therapy Evaluation Patient Details Name: CHASE ARNALL MRN: [id number] DOB: Jan 17, 1946 Today's Date: 06/20/2022  History of Present Illness  Patient is a 76 y/o male who presents on 9/7 from [facility] for AMS. Admitted with acute encephalopathy and respiratory failure. CXR-pleural effusion s/p thoracentesis 9/8. Recent admission 8/25-9/1 for acute toxic encephalopathy-drug toxicity/polypharmacy overdosing. PMH includes chronic A-fib on Eliquis, DM, HTN, peripheral neuropathy, left BKA.  Clinical Impression  Patient presents with generalized weakness, deconditioning, impaired balance and impaired mobility s/p above. Pt is from [facility] and requires assist for ADLs at baseline. Has been working with therapy with his new prosthesis with the goal to walk again with RW. Today, pt requires Max A for bed mobility and Min guard assist for sitting balance. Instructed pt in some there ex to perform throughout the day. Would benefit from return to SNF to maximize independence and mobility. Will follow acutely.     Recommendations for follow up therapy are one component of a multi-disciplinary discharge planning process, led by the attending physician.  Recommendations may be updated based on patient status, additional functional criteria and insurance authorization.  Follow Up Recommendations Skilled nursing-short term rehab (<3 hours/day) (with transition to long term care) Can patient physically be transported by private vehicle: No    Assistance Recommended at Discharge Frequent or constant Supervision/Assistance  Patient can return home with the following  A lot of help with bathing/dressing/bathroom;Assistance with cooking/housework;Help with stairs or ramp for entrance;Two people to help with walking and/or transfers;Direct supervision/assist for medications management    Equipment Recommendations None recommended by PT  Recommendations for Other Services       Functional Status  Assessment Patient has had a recent decline in their functional status and demonstrates the ability to make significant improvements in function in a reasonable and predictable amount of time.     Precautions / Restrictions Precautions Precautions: Fall;Other (comment) Precaution Comments: left BKA, no prosthesis in room (at [facility]) Restrictions Weight Bearing Restrictions: No      Mobility  Bed Mobility Overal bed mobility: Needs Assistance Bed Mobility: Supine to Sit, Sit to Supine     Supine to sit: Max assist, HOB elevated Sit to supine: Max assist, HOB elevated   General bed mobility comments: Cues for sequencing and increased time, assist with bottom and trunk to get to EOB, Assist to bring LEs into bed to return to supine.    Transfers Overall transfer level: Needs assistance   Transfers: Bed to chair/wheelchair/BSC            Lateral/Scoot Transfers: Max assist, +2 physical assistance General transfer comment: Assist of 2 to laterally scoot along side bed towards left using pad x3. Cues for anterior weight shift and technique.    Ambulation/Gait               General Gait Details: Unable  Stairs            Wheelchair Mobility    Modified Rankin (Stroke Patients Only)       Balance Overall balance assessment: Needs assistance, History of Falls Sitting-balance support: Single extremity supported, No upper extremity supported (foot supported) Sitting balance-Leahy Scale: Fair Sitting balance - Comments: Initially needing Ue support progressing to no UE support. Worked on coming down onto elbow to activate core.  Pertinent Vitals/Pain Pain Assessment Pain Assessment: Faces Faces Pain Scale: No hurt    Home Living Family/patient expects to be discharged to:: Skilled nursing facility                        Prior Function               Mobility Comments: Was working with  therapies at [facility] on standing in his new prosthesis. ADLs Comments: assist needed     Hand Dominance   Dominant Hand: Right    Extremity/Trunk Assessment   Upper Extremity Assessment Upper Extremity Assessment: Defer to OT evaluation RUE Deficits / Details: Painful R shoulder - supposed to have TSA but contraindicated by cardiologist LUE Deficits / Details: Also with shoulder limitations but not as severe as RT and not painful.    Lower Extremity Assessment Lower Extremity Assessment: RLE deficits/detail;LLE deficits/detail RLE Deficits / Details: ROM WFL; MMT 3+-4/5 RLE Sensation: decreased light touch;history of peripheral neuropathy LLE Deficits / Details: BKA; ROM WFL; MMT 4/5 LLE Sensation: decreased light touch;history of peripheral neuropathy    Cervical / Trunk Assessment Cervical / Trunk Assessment: Kyphotic  Communication   Communication: No difficulties  Cognition Arousal/Alertness: Awake/alert Behavior During Therapy: WFL for tasks assessed/performed Overall Cognitive Status: History of cognitive impairments - at baseline                                 General Comments: Follows commands well. Seems at baseline per ex wife who is present. Wants to go back to rehab.        General Comments General comments (skin integrity, edema, etc.): Ex-wife present during session.    Exercises     Assessment/Plan    PT Assessment Patient needs continued PT services  PT Problem List Decreased strength;Decreased mobility;Decreased safety awareness;Decreased activity tolerance;Decreased cognition;Cardiopulmonary status limiting activity;Decreased balance;Decreased knowledge of use of DME       PT Treatment Interventions DME instruction;Therapeutic activities;Gait training;Therapeutic exercise;Patient/family education;Balance training;Wheelchair mobility training;Functional mobility training;Neuromuscular re-education    PT Goals (Current goals can  be found in the Care Plan section)  Acute Rehab PT Goals Patient Stated Goal: Wife reports plan for rehab with transition to long term care as pt not able to manage at home PT Goal Formulation: With patient/family Time For Goal Achievement: 07/04/22 Potential to Achieve Goals: Fair    Frequency Min 2X/week     Co-evaluation               AM-PAC PT "6 Clicks" Mobility  Outcome Measure Help needed turning from your back to your side while in a flat bed without using bedrails?: A Lot Help needed moving from lying on your back to sitting on the side of a flat bed without using bedrails?: Total Help needed moving to and from a bed to a chair (including a wheelchair)?: Total Help needed standing up from a chair using your arms (e.g., wheelchair or bedside chair)?: Total Help needed to walk in hospital room?: Total Help needed climbing 3-5 steps with a railing? : Total 6 Click Score: 7    End of Session Equipment Utilized During Treatment: Gait belt Activity Tolerance: Patient tolerated treatment well Patient left: in bed;with call bell/phone within reach;with bed alarm set Nurse Communication: Mobility status PT Visit Diagnosis: Other abnormalities of gait and mobility (R26.89);Muscle weakness (generalized) (M62.81)    Time: 9678-9381 PT Time  Calculation (min) (ACUTE ONLY): 19 min   Charges:   PT Evaluation $PT Eval Moderate Complexity: 1 Mod          Marisa Severin, PT, DPT Acute Rehabilitation Services Secure chat preferred Office [phone]     Marguarite Arbour A Sabra Heck 06/20/2022, 12:39 PM

## 2022-06-20 NOTE — NC FL2 (Signed)
[place] LEVEL OF CARE SCREENING TOOL     IDENTIFICATION  Patient Name: Darrell Horne Birthdate: Dec 04, 1945 Sex: male Admission Date (Current Location): 06/16/2022  [facility] and [place] Number:  Herbalist and Address:  [facility] [place], [place] [address] [place],  [address]      Provider Number: [id number]  Attending Physician Name and Address:  Flora Lipps, MD  Relative Name and Phone Number:       Current Level of Care: Hospital Recommended Level of Care: [place] Prior Approval Number:    Date Approved/Denied:   PASRR Number:    Discharge Plan: SNF    Current Diagnoses: Patient Active Problem List   Diagnosis Date Noted   Pressure injury of skin 06/18/2022   Pancytopenia ([place]) 06/17/2022   Pleural effusion on right 06/17/2022   Acute encephalopathy 06/16/2022   Overdose by ingestion 06/03/2022   Overdose 06/03/2022   Exudative age-related macular degeneration of left eye with active choroidal neovascularization ([place]) 12/16/2020   Diabetes mellitus without complication ([place]) 17/40/8144   Early stage nonexudative age-related macular degeneration of right eye 12/16/2020   Former smoker 12/14/2020   Positive hepatitis C antibody test 10/08/2019   Personal history of colonic polyps 11/06/2018   DDD (degenerative disc disease), lumbar 08/17/2018   DDD (degenerative disc disease), cervical 08/17/2018   Hx of BKA, left ([place]) 11/11/2016   High risk medication use 11/11/2016   Syncope 04/17/2016   Diabetic polyneuropathy associated with type 2 diabetes mellitus ([place]) 81/85/6314   Alcoholic peripheral neuropathy ([place]) 12/23/2015   Sensory ataxia 12/23/2015   Headache 11/04/2015   Chronic low back pain 11/04/2015   Neuropathy due to medical condition ([place]) 04/08/2015   Diabetes ([place]) 04/09/2014   PAF (paroxysmal atrial fibrillation) (HCC)    DM2 (diabetes mellitus, type 2) (HCC)    HTN (hypertension)     Palpitations    HLD (hyperlipidemia)     Orientation RESPIRATION BLADDER Height & Weight     Self, Time, Place  O2 ( 2L) Incontinent Weight: 219 lb 2.2 oz (99.4 kg) Height:  '5\' 10"'$  (177.8 cm)  BEHAVIORAL SYMPTOMS/MOOD NEUROLOGICAL BOWEL NUTRITION STATUS      Continent Diet (regular)  AMBULATORY STATUS COMMUNICATION OF NEEDS Skin   Extensive Assist Verbally PU Stage and Appropriate Care   PU Stage 2 Dressing:  (buttocks, change dressing PRN)                   Personal Care Assistance Level of Assistance  Bathing, Feeding, Dressing Bathing Assistance: Maximum assistance Feeding assistance: Limited assistance Dressing Assistance: Maximum assistance     Functional Limitations Info  Sight Sight Info: Impaired        SPECIAL CARE FACTORS FREQUENCY  PT (By licensed PT), OT (By licensed OT)     PT Frequency: 5x/wk OT Frequency: 5x/wk            Contractures Contractures Info: Not present    Additional Factors Info  Code Status, Allergies, Insulin Sliding Scale Code Status Info: Full Allergies Info: NKA   Insulin Sliding Scale Info: see DC summary       Current Medications (06/20/2022):  This is the current hospital active medication list Current Facility-Administered Medications  Medication Dose Route Frequency Provider Last Rate Last Admin   acetaminophen (TYLENOL) tablet 650 mg  650 mg Oral Q6H PRN Howerter, Justin B, DO       Or   acetaminophen (TYLENOL) suppository 650  mg  650 mg Rectal Q6H PRN Howerter, Justin B, DO       albumin human 25 % solution 12.5 g  12.5 g Intravenous Q6H Pokhrel, Laxman, MD       amiodarone (PACERONE) tablet 200 mg  200 mg Oral BID Darliss Cheney, MD   200 mg at 06/20/22 1035   amitriptyline (ELAVIL) tablet 50 mg  50 mg Oral QHS Darliss Cheney, MD   50 mg at 06/19/22 2137   apixaban (ELIQUIS) tablet 5 mg  5 mg Oral BID Darliss Cheney, MD   5 mg at 06/20/22 1035   atorvastatin (LIPITOR) tablet 40 mg  40 mg Oral Daily Darliss Cheney, MD   40 mg at 32/44/01 0272   folic acid (FOLVITE) tablet 1 mg  1 mg Oral Daily Pahwani, Einar Grad, MD   1 mg at 06/20/22 1035   gabapentin (NEURONTIN) tablet 300 mg  300 mg Oral TID Darliss Cheney, MD   300 mg at 06/19/22 2137   lidocaine (PF) (XYLOCAINE) 1 % injection    PRN Docia Barrier, PA   10 mL at 06/17/22 1256   meclizine (ANTIVERT) tablet 25 mg  25 mg Oral PRN Darliss Cheney, MD       metoprolol succinate (TOPROL-XL) 24 hr tablet 25 mg  25 mg Oral Daily Pokhrel, Laxman, MD   25 mg at 06/19/22 1007   polyethylene glycol (MIRALAX / GLYCOLAX) packet 17 g  17 g Oral Daily Pahwani, Einar Grad, MD   17 g at 06/19/22 1008   topiramate (TOPAMAX) tablet 75 mg  75 mg Oral Daily Darliss Cheney, MD   75 mg at 06/20/22 1035     Discharge Medications: Please see discharge summary for a list of discharge medications.  Relevant Imaging Results:  Relevant Lab Results:   Additional Information SS#: [id number]  Geralynn Ochs, LCSW

## 2022-06-20 NOTE — TOC Initial Note (Signed)
Transition of Care [facility]) - Initial/Assessment Note    Patient Details  Name: Darrell Horne MRN: [id number] Date of Birth: 06-04-1946  Transition of Care [facility]) CM/SW Contact:    Geralynn Ochs, LCSW Phone Number: 06/20/2022, 3:16 PM  Clinical Narrative:            Patient from [facility] for rehab, plans to return and possibly transition to LTC after rehabilitation. CSW confirmed with [facility] that patient has bed available when medically stable. CSW to follow.       Expected Discharge Plan: Skilled Nursing Facility Barriers to Discharge: Continued Medical Work up, Ship broker   Patient Goals and CMS Choice Patient states their goals for this hospitalization and ongoing recovery are:: get back to rehab CMS Medicare.gov Compare Post Acute Care list provided to:: Patient Choice offered to / list presented to : Patient, Spouse  Expected Discharge Plan and Services Expected Discharge Plan: [place] Acute Care Choice: [place] Living arrangements for the past 2 months: Apartment                                      Prior Living Arrangements/Services Living arrangements for the past 2 months: Apartment Lives with:: Spouse Patient language and need for interpreter reviewed:: No Do you feel safe going back to the place where you live?: Yes      Need for Family Participation in Patient Care: Yes (Comment) Care giver support system in place?: No (comment)   Criminal Activity/Legal Involvement Pertinent to Current Situation/Hospitalization: No - Comment as needed  Activities of Daily Living Home Assistive Devices/Equipment: Wheelchair, Prosthesis ADL Screening (condition at time of admission) Patient's cognitive ability adequate to safely complete daily activities?: No Is the patient deaf or have difficulty hearing?: Yes Does the patient have difficulty seeing, even when wearing glasses/contacts?: No Does the  patient have difficulty concentrating, remembering, or making decisions?: Yes Patient able to express need for assistance with ADLs?: Yes Does the patient have difficulty dressing or bathing?: Yes Independently performs ADLs?: No Does the patient have difficulty walking or climbing stairs?: Yes Weakness of Legs: None Weakness of Arms/Hands: None  Permission Sought/Granted Permission sought to share information with : Facility Sport and exercise psychologist, Family Supports Permission granted to share information with : Yes, Verbal Permission Granted  Share Information with NAME: [name]  Permission granted to share info w AGENCY: Art gallery manager granted to share info w Relationship: Spouse     Emotional Assessment   Attitude/Demeanor/Rapport: Engaged Affect (typically observed): Appropriate Orientation: : Oriented to Self, Oriented to Place, Oriented to  Time Alcohol / Substance Use: Not Applicable Psych Involvement: No (comment)  Admission diagnosis:  Pleural effusion [J90] Encephalopathy [G93.40] Acute encephalopathy [G93.40] Patient Active Problem List   Diagnosis Date Noted   Pressure injury of skin 06/18/2022   Pancytopenia ([place]) 06/17/2022   Pleural effusion on right 06/17/2022   Acute encephalopathy 06/16/2022   Overdose by ingestion 06/03/2022   Overdose 06/03/2022   Exudative age-related macular degeneration of left eye with active choroidal neovascularization ([place]) 12/16/2020   Diabetes mellitus without complication ([place]) 77/41/2878   Early stage nonexudative age-related macular degeneration of right eye 12/16/2020   Former smoker 12/14/2020   Positive hepatitis C antibody test 10/08/2019   Personal history of colonic polyps 11/06/2018   DDD (degenerative disc disease), lumbar 08/17/2018   DDD (degenerative  disc disease), cervical 08/17/2018   Hx of BKA, left ([place]) 11/11/2016   High risk medication use 11/11/2016   Syncope 04/17/2016   Diabetic polyneuropathy  associated with type 2 diabetes mellitus ([place]) 12/22/9456   Alcoholic peripheral neuropathy ([place]) 12/23/2015   Sensory ataxia 12/23/2015   Headache 11/04/2015   Chronic low back pain 11/04/2015   Neuropathy due to medical condition ([place]) 04/08/2015   Diabetes ([place]) 04/09/2014   PAF (paroxysmal atrial fibrillation) ([place])    DM2 (diabetes mellitus, type 2) ([place])    HTN (hypertension)    Palpitations    HLD (hyperlipidemia)    PCP:  Sandrea Hughs, NP Pharmacy:   [id number] [place], [phone] [id number]: [phone]: 3[place]- [email or web address] [address] [id number]: [phone]: [phone]    Social Determinants of Health (SDOH) Interventions    Readmission Risk Interventions     No data to display

## 2022-06-20 NOTE — Consult Note (Signed)
   [facility] Inpatient Consult   06/20/2022  Darrell Horne October 28, 1945 [id number]  [facility] [ACO] Patient: [name]   Primary Care Provider:  Ngetich, Nelda Bucks, NP, [facility]    Patient screened for readmission hospitalization with noted less than 30 days and to assess for potential [facility] service needs for post hospital transition.  Review of patient's medical record reveals patient is currently being recommended for  SNF.  1:20 pm came by to see patient he was in nursing care, current plan for disposition is to return to SNF rehab, recommendations for LTC   Plan:  Continue to follow progress and disposition to assess for post hospital care management needs.    For questions contact:   Natividad Brood, RN BSN [facility] Liaison  [phone] business mobile phone Toll free office [phone]  Fax number: [phone] [email or web address]

## 2022-06-20 NOTE — Progress Notes (Addendum)
PROGRESS NOTE    Darrell Horne  YNW:[id number] DOB: 10/31/1945 DOA: 06/16/2022 PCP: Sandrea Hughs, NP    Brief Narrative:  Darrell Horne is a 76 y.o. male with medical history significant for paroxysmal atrial fibrillation chronically anticoagulated on Eliquis, status post left BKA, GERD, essential hypertension, hyperlipidemia, presented to hospital with altered mental status from skilled nursing facility.  Of note patient was recently hospitalized for encephalopathy which was thought to be secondary to unintentional overdose.  He was then discharged to skilled nursing facility but over the last 1 or 2 days has been having increasing confusion.  In the ED, patient was on nasal cannula oxygen saturating 94 to 100% on 4 L nasal cannula, with unclear baseline supplemental oxygen requirements.  Lab work was remarkable for hemoglobin of 12.0.  Ammonia was 39.  Total count was 3800.  Urinalysis did not show any white cells.  COVID-19 PCR was negative.  EKG showed atrial fibrillation.  Chest x-ray showed moderate to large right-sided pleural effusion with interval increase from 06/04/2022.  CT head scan was negative.  Patient was then considered for admission to hospital for further encephalopathy work-up.   Assessment and plan.  Principal Problem:   Acute encephalopathy Active Problems:   PAF (paroxysmal atrial fibrillation) (HCC)   HLD (hyperlipidemia)   Pancytopenia (HCC)   Pleural effusion on right   Pressure injury of skin   Acute  encephalopathy: Toxic/ metabolic in nature.  Could be polypharmacy as well, was on several outpatient medications as outpatient.  He is more alert awake and communicative today.   Acute hypoxia secondary to moderate to large right pleural effusion:  Ruled out acute respiratory failure.  Left-sided  pleural effusion as well.  Albumin very low at 1.8.  Patient underwent ultrasound-guided right sided diagnostic and therapeutic thoracentesis with removal of 1.2 L of  yellow fluid on 06/27/2022.  Conservative fluid.  Pleural fluid culture negative so far, still on supplemental oxygen 3 L/min..  Gram stain with no organisms.  Pneumonia was ruled out.  Procalcitonin was negative.  Patient is afebrile.  Continue to wean off oxygen as able.Marland Kitchen  BNP elevated.  2D echocardiogram with preserved LV function and mild diastolic dysfunction..  Patient marginally low so unable to use Lasix.  Try albumin infusion, dietary consult.  When blood pressure improves try Lasix.    Hypoalbuminemia.  Likely poor nutrition.  We will get dietitian consult.  We will give albumin infusion today.  Pancytopenia: Patient has chronic anemia.  Improved at this time.  Hypokalemia.  Replace orally with potassium 40 mEq.  Check level in a.m.  Paroxysmal atrial fibrillation:  -CHA2DS2-VASc score of 4.  On beta-blocker amiodarone and Eliquis.  Received 20 mg of IV Lasix in the ED.  We will hold off with further doses.Marland Kitchen  Keep beta-blocker with holding parameters due to borderline low blood pressure.  GERD -continue PPI   Pressure injury: Present on admission.  Stage II left buttock pressure ulceration.  Continue wound care.   Hyperlipidemia:  -Continue statin.  Debility, deconditioning.  Patient is currently from skilled nursing facility.  Physical therapy has recommended skilled nursing facility at this time.       DVT prophylaxis: SCDs Start: 06/16/22 2317 apixaban (ELIQUIS) tablet 5 mg   Code Status:     Code Status: Full Code  Disposition: Skilled nursing facility likely in 1 to 2 days.  Status is: Inpatient  Remains inpatient appropriate because: Encephalopathy, hypotension, pleural effusion   Family Communication:  Spoke with the patient's wife on the phone and updated her about the clinical condition of the patient.  Consultants:  Neurology  Procedures:  IR guided thoracocentesis on the right side on 06/17/2022.  Antimicrobials:  None  Anti-infectives (From admission,  onward)    None      Subjective: Today, patient was seen and examined at bedside.  Appears to be more alert awake and communicative today.  Is eating some, and according to wife is eating little better today..  Denies any pain, nausea, fever, chills or pain.    Objective: Vitals:   06/20/22 0430 06/20/22 0845 06/20/22 0900 06/20/22 1310  BP: (!) 93/52 (!) 83/50 (!) 87/53 91/60  Pulse:  79  86  Resp: '18 20  20  '$ Temp: 98.6 F (37 C) (!) 97.5 F (36.4 C)  (!) 97.5 F (36.4 C)  TempSrc: Oral Oral  Axillary  SpO2: 99% 100%  100%  Weight:      Height:        Intake/Output Summary (Last 24 hours) at 06/20/2022 1333 Last data filed at 06/20/2022 1318 Gross per 24 hour  Intake 296 ml  Output 700 ml  Net -404 ml    Filed Weights   06/16/22 1453 06/19/22 0500 06/20/22 0429  Weight: 91.5 kg 101 kg 99.4 kg    Physical Examination: Body mass index is 31.44 kg/m.  General: Obese built, not in obvious distress, more alert awake and communicative today.  Appears weak and deconditioned.  On nasal cannula oxygen HENT:   No scleral pallor or icterus noted. Oral mucosa is moist.  Chest:.  Diminished breath sounds bilaterally. CVS: S1 &S2 heard. No murmur.  Regular rate and rhythm. Abdomen: Soft, nontender, nondistended.  Bowel sounds are heard.   Extremities: No cyanosis, clubbing mild right-sided edema.  Peripheral pulses are palpable.  Left below-knee amputation. Psych: Alert, awake and Communicative, oriented to place CNS:  No cranial nerve deficits.  Moves all extremities, Skin: Warm and dry.  No rashes noted.  Data Reviewed:   CBC: Recent Labs  Lab 06/16/22 1510 06/17/22 0342 06/17/22 3664 06/18/22 0820 06/19/22 0202 06/20/22 0811  WBC 3.8* 5.1  --  8.4 9.2 4.8  NEUTROABS 2.5 3.0  --  5.8  --   --   HGB 12.0* 11.8* 11.6* 12.3* 13.6 10.1*  HCT 37.3* 36.8* 34.0* 37.0* 40.9 31.0*  MCV 107.5* 109.5*  --  102.8* 102.0* 102.0*  PLT 149* 163  --  175 186 PLATELET CLUMPS  NOTED ON SMEAR, COUNT APPEARS DECREASED     Basic Metabolic Panel: Recent Labs  Lab 06/16/22 1510 06/17/22 0342 06/17/22 0937 06/18/22 0820 06/19/22 0202 06/20/22 0811  NA 140 140 141 137 137 137  K 3.7 4.2 3.8 4.0 3.5 3.2*  CL 110 112*  --  110 108 109  CO2 26 23  --  '22 23 25  '$ GLUCOSE 116* 83  --  83 117* 78  BUN 15 13  --  '12 12 13  '$ CREATININE 0.63 0.56*  --  0.61 0.66 0.59*  CALCIUM 8.1* 8.2*  --  8.1* 8.3* 8.0*  MG 1.8 1.8  --   --   --  1.9     Liver Function Tests: Recent Labs  Lab 06/16/22 1510 06/17/22 0342 06/18/22 0820  AST 51* 38 29  ALT 33 33 25  ALKPHOS 196* 176* 166*  BILITOT 0.7 0.8 0.8  PROT 5.4* 5.4* 5.3*  ALBUMIN 1.8* 1.8* 1.8*      Radiology  Studies: ECHOCARDIOGRAM COMPLETE  Result Date: 06/20/2022    ECHOCARDIOGRAM REPORT   Patient Name:   Darrell Horne Date of Exam: 06/20/2022 Medical Rec #:  [id number]     Height:       70.0 in Accession #:    [id number]    Weight:       219.1 lb Date of Birth:  [birth year]     BSA:          2.170 m Patient Age:    8 years      BP:           87/53 mmHg Patient Gender: M             HR:           64 bpm. Exam Location:  Inpatient Procedure: 2D Echo, Cardiac Doppler and Color Doppler Indications:    Pleural effusion  History:        Patient has prior history of Echocardiogram examinations, most                 recent 04/19/2016. Risk Factors:Hypertension, Diabetes and                 Dyslipidemia.  Sonographer:    Jefferey Pica Referring Phys: [id number] [place]  1. Left ventricular ejection fraction, by estimation, is 55 to 60%. The left ventricle has normal function. The left ventricle has no regional wall motion abnormalities. Left ventricular diastolic parameters are indeterminate.  2. Right ventricular systolic function is low normal. The right ventricular size is normal. There is mildly elevated pulmonary artery systolic pressure. The estimated right ventricular systolic pressure is 51.8 mmHg.  3.  Large pleural effusion in the left lateral region.  4. The mitral valve is grossly normal. Mild to moderate mitral valve regurgitation.  5. The aortic valve was not well visualized. Aortic valve regurgitation is trivial. No aortic stenosis is present. Comparison(s): MR worse from prior report. FINDINGS  Left Ventricle: Left ventricular ejection fraction, by estimation, is 55 to 60%. The left ventricle has normal function. The left ventricle has no regional wall motion abnormalities. The left ventricular internal cavity size was small. There is no left ventricular hypertrophy. Left ventricular diastolic parameters are indeterminate. Right Ventricle: The right ventricular size is normal. No increase in right ventricular wall thickness. Right ventricular systolic function is low normal. There is mildly elevated pulmonary artery systolic pressure. The tricuspid regurgitant velocity is 2.34 m/s, and with an assumed right atrial pressure of 15 mmHg, the estimated right ventricular systolic pressure is 84.1 mmHg. Left Atrium: Left atrial size was normal in size. Right Atrium: Right atrial size was normal in size. Pericardium: There is no evidence of pericardial effusion. Mitral Valve: The mitral valve is grossly normal. Mild to moderate mitral annular calcification. Mild to moderate mitral valve regurgitation. Tricuspid Valve: The tricuspid valve is normal in structure. Tricuspid valve regurgitation is not demonstrated. No evidence of tricuspid stenosis. Aortic Valve: The aortic valve was not well visualized. Aortic valve regurgitation is trivial. No aortic stenosis is present. Aortic valve peak gradient measures 6.5 mmHg. Pulmonic Valve: The pulmonic valve was not well visualized. Pulmonic valve regurgitation is not visualized. Aorta: The aortic root is normal in size and structure and the ascending aorta was not well visualized. IAS/Shunts: No atrial level shunt detected by color flow Doppler. Additional Comments: There  is a large pleural effusion in the left lateral region.  LEFT VENTRICLE PLAX 2D LVIDd:  3.60 cm LVIDs:         2.80 cm LV PW:         0.90 cm LV IVS:        1.00 cm LVOT diam:     1.80 cm LV SV:         45 LV SV Index:   21 LVOT Area:     2.54 cm  RIGHT VENTRICLE          IVC RV Basal diam:  3.10 cm  IVC diam: 2.10 cm LEFT ATRIUM             Index        RIGHT ATRIUM           Index LA diam:        3.60 cm 1.66 cm/m   RA Area:     14.00 cm LA Vol (A2C):   45.3 ml 20.88 ml/m  RA Volume:   34.70 ml  15.99 ml/m LA Vol (A4C):   43.6 ml 20.09 ml/m LA Biplane Vol: 46.7 ml 21.52 ml/m  AORTIC VALVE                 PULMONIC VALVE AV Area (Vmax): 1.54 cm     PV Vmax:       0.63 m/s AV Vmax:        127.00 cm/s  PV Peak grad:  1.6 mmHg AV Peak Grad:   6.5 mmHg LVOT Vmax:      76.70 cm/s LVOT Vmean:     51.800 cm/s LVOT VTI:       0.176 m  AORTA Ao Root diam: 3.20 cm MR Peak grad: 48.2 mmHg   TRICUSPID VALVE MR Vmax:      347.00 cm/s TR Peak grad:   21.9 mmHg                           TR Vmax:        234.00 cm/s                            SHUNTS                           Systemic VTI:  0.18 m                           Systemic Diam: 1.80 cm Rudean Haskell MD Electronically signed by Rudean Haskell MD Signature Date/Time: 06/20/2022/11:13:16 AM    Final       LOS: 4 days    Flora Lipps, MD [facility] Available via Epic secure chat 7am-7pm After these hours, please refer to coverage provider listed on [email or web address] 06/20/2022, 1:33 PM

## 2022-06-20 NOTE — Care Management Important Message (Signed)
Important Message  Patient Details  Name: Darrell Horne MRN: [id number] Date of Birth: 1946/09/29   Medicare Important Message Given:  Yes     Orbie Pyo 06/20/2022, 3:13 PM

## 2022-06-21 ENCOUNTER — Inpatient Hospital Stay (HOSPITAL_COMMUNITY): Payer: Medicare Other

## 2022-06-21 DIAGNOSIS — E782 Mixed hyperlipidemia: Secondary | ICD-10-CM | POA: Diagnosis not present

## 2022-06-21 DIAGNOSIS — I480 Paroxysmal atrial fibrillation: Secondary | ICD-10-CM | POA: Diagnosis not present

## 2022-06-21 DIAGNOSIS — E43 Unspecified severe protein-calorie malnutrition: Secondary | ICD-10-CM | POA: Insufficient documentation

## 2022-06-21 DIAGNOSIS — G9340 Encephalopathy, unspecified: Secondary | ICD-10-CM | POA: Diagnosis not present

## 2022-06-21 DIAGNOSIS — D649 Anemia, unspecified: Secondary | ICD-10-CM

## 2022-06-21 DIAGNOSIS — J90 Pleural effusion, not elsewhere classified: Secondary | ICD-10-CM | POA: Diagnosis not present

## 2022-06-21 LAB — BLOOD GAS, ARTERIAL
Acid-Base Excess: 2 mmol/L (ref 0.0–2.0)
Bicarbonate: 27.3 mmol/L (ref 20.0–28.0)
O2 Saturation: 98.8 %
Patient temperature: 37
pCO2 arterial: 44 mmHg (ref 32–48)
pH, Arterial: 7.4 (ref 7.35–7.45)
pO2, Arterial: 87 mmHg (ref 83–108)

## 2022-06-21 LAB — CBC
HCT: 30.8 % — ABNORMAL LOW (ref 39.0–52.0)
Hemoglobin: 10.3 g/dL — ABNORMAL LOW (ref 13.0–17.0)
MCH: 33.9 pg (ref 26.0–34.0)
MCHC: 33.4 g/dL (ref 30.0–36.0)
MCV: 101.3 fL — ABNORMAL HIGH (ref 80.0–100.0)
Platelets: 104 10*3/uL — ABNORMAL LOW (ref 150–400)
RBC: 3.04 MIL/uL — ABNORMAL LOW (ref 4.22–5.81)
RDW: 13.7 % (ref 11.5–15.5)
WBC: 4.8 10*3/uL (ref 4.0–10.5)
nRBC: 0 % (ref 0.0–0.2)

## 2022-06-21 LAB — BASIC METABOLIC PANEL
Anion gap: 6 (ref 5–15)
BUN: 12 mg/dL (ref 8–23)
CO2: 25 mmol/L (ref 22–32)
Calcium: 8.6 mg/dL — ABNORMAL LOW (ref 8.9–10.3)
Chloride: 107 mmol/L (ref 98–111)
Creatinine, Ser: 0.62 mg/dL (ref 0.61–1.24)
GFR, Estimated: >60 mL/min (ref >60)
Glucose, Bld: 106 mg/dL — ABNORMAL HIGH (ref 70–99)
Potassium: 3.4 mmol/L — ABNORMAL LOW (ref 3.5–5.1)
Sodium: 138 mmol/L (ref 135–145)

## 2022-06-21 LAB — GLUCOSE, CAPILLARY
Glucose-Capillary: 102 mg/dL — ABNORMAL HIGH (ref 70–99)
Glucose-Capillary: 123 mg/dL — ABNORMAL HIGH (ref 70–99)

## 2022-06-21 LAB — PATHOLOGIST SMEAR REVIEW: Path Review: NEGATIVE

## 2022-06-21 LAB — FOLATE: Folate: 34.3 ng/mL (ref >5.9)

## 2022-06-21 LAB — VITAMIN B12: Vitamin B-12: 586 pg/mL (ref 180–914)

## 2022-06-21 LAB — MAGNESIUM
Magnesium: 1.8 mg/dL (ref 1.7–2.4)
Magnesium: 2.2 mg/dL (ref 1.7–2.4)

## 2022-06-21 LAB — PHOSPHORUS: Phosphorus: 3.5 mg/dL (ref 2.5–4.6)

## 2022-06-21 MED ORDER — FUROSEMIDE 10 MG/ML IJ SOLN
40.0000 mg | INTRAMUSCULAR | Status: AC
  Administered 2022-06-21: 40 mg via INTRAVENOUS
  Filled 2022-06-21: qty 4

## 2022-06-21 MED ORDER — POTASSIUM CHLORIDE CRYS ER 20 MEQ PO TBCR
40.0000 meq | EXTENDED_RELEASE_TABLET | Freq: Once | ORAL | Status: AC
  Administered 2022-06-21: 40 meq via ORAL
  Filled 2022-06-21: qty 2

## 2022-06-21 MED ORDER — MAGNESIUM SULFATE 2 GM/50ML IV SOLN
2.0000 g | Freq: Once | INTRAVENOUS | Status: AC
  Administered 2022-06-21: 2 g via INTRAVENOUS
  Filled 2022-06-21: qty 50

## 2022-06-21 MED ORDER — MIDODRINE HCL 5 MG PO TABS
10.0000 mg | ORAL_TABLET | Freq: Three times a day (TID) | ORAL | Status: AC
  Administered 2022-06-21 – 2022-06-22 (×3): 10 mg
  Filled 2022-06-21 (×3): qty 2

## 2022-06-21 MED ORDER — MUPIROCIN CALCIUM 2 % EX CREA
TOPICAL_CREAM | Freq: Every day | CUTANEOUS | Status: DC
  Administered 2022-06-24 – 2022-07-09 (×3): 1 via TOPICAL
  Filled 2022-06-21 (×5): qty 15

## 2022-06-21 MED ORDER — PROSOURCE TF20 ENFIT COMPATIBL EN LIQD
60.0000 mL | Freq: Every day | ENTERAL | Status: DC
  Administered 2022-06-21 – 2022-07-03 (×13): 60 mL
  Filled 2022-06-21 (×14): qty 60

## 2022-06-21 MED ORDER — POTASSIUM CHLORIDE CRYS ER 20 MEQ PO TBCR
40.0000 meq | EXTENDED_RELEASE_TABLET | ORAL | Status: AC
  Administered 2022-06-21: 40 meq via ORAL
  Filled 2022-06-21: qty 2

## 2022-06-21 MED ORDER — FREE WATER
50.0000 mL | Status: DC
  Administered 2022-06-21 – 2022-06-27 (×32): 50 mL

## 2022-06-21 MED ORDER — FUROSEMIDE 10 MG/ML IJ SOLN
20.0000 mg | INTRAMUSCULAR | Status: AC
  Administered 2022-06-21: 20 mg via INTRAVENOUS
  Filled 2022-06-21: qty 4

## 2022-06-21 MED ORDER — ORAL CARE MOUTH RINSE: 15.0000 mL | OROMUCOSAL | Status: DC | PRN

## 2022-06-21 MED ORDER — ALBUMIN HUMAN 25 % IV SOLN
12.5000 g | Freq: Once | INTRAVENOUS | Status: AC
Start: 2022-06-21 — End: 2022-06-21
  Administered 2022-06-21: 12.5 g via INTRAVENOUS
  Filled 2022-06-21: qty 50

## 2022-06-21 MED ORDER — FUROSEMIDE 10 MG/ML IJ SOLN
20.0000 mg | Freq: Once | INTRAMUSCULAR | Status: AC
  Administered 2022-06-21: 20 mg via INTRAVENOUS
  Filled 2022-06-21: qty 4

## 2022-06-21 MED ORDER — ORAL CARE MOUTH RINSE
15.0000 mL | OROMUCOSAL | Status: DC
  Administered 2022-06-21 – 2022-06-23 (×10): 15 mL via OROMUCOSAL

## 2022-06-21 MED ORDER — OSMOLITE 1.5 CAL PO LIQD
1000.0000 mL | ORAL | Status: DC
  Administered 2022-06-21: 1000 mL
  Filled 2022-06-21 (×3): qty 1000

## 2022-06-21 MED ORDER — POTASSIUM CHLORIDE 20 MEQ PO PACK
40.0000 meq | PACK | ORAL | Status: AC
  Administered 2022-06-21: 40 meq
  Filled 2022-06-21: qty 2

## 2022-06-21 NOTE — Progress Notes (Signed)
Dr. Louanne Belton made aware that the patient is very sleepy and unable to stay awake. Also made aware that the patient is not eating or drinking because he is so lethargic.

## 2022-06-21 NOTE — Procedures (Signed)
Cortrak  Person Inserting Tube:  Ranell Patrick D, RD Tube Type:  Cortrak - 43 inches Tube Size:  10 Tube Location:  Left nare Secured by: Bridle Technique Used to Measure Tube Placement:  Marking at nare/corner of mouth Cortrak Secured At:  70 cm  Cortrak Tube Team Note:  Consult received to place a Cortrak feeding tube.   X-ray is required, abdominal x-ray has been ordered by the Cortrak team. Please confirm tube placement before using the Cortrak tube.   If the tube becomes dislodged please keep the tube and contact the Cortrak team at [email or web address] ([email or web address]) for replacement.  If after hours and replacement cannot be delayed, place a NG tube and confirm placement with an abdominal x-ray.    Ranell Patrick, RD, LDN Clinical Dietitian RD pager # available in [place]  After hours/weekend pager # available in [facility]
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
## 2022-06-21 NOTE — Progress Notes (Addendum)
PROGRESS NOTE    Darrell Horne  WGN:[id number] DOB: 1946/03/31 DOA: 06/16/2022 PCP: Sandrea Hughs, NP    Brief Narrative:  Darrell Horne is a 76 y.o. male with medical history significant for paroxysmal atrial fibrillation chronically anticoagulated on Eliquis, status post left BKA, GERD, essential hypertension, hyperlipidemia, presented to the hospital with altered mental status from skilled nursing facility.  Of note, patient was recently hospitalized for encephalopathy which was thought to be secondary to unintentional overdose.  He was then discharged to skilled nursing facility but over the last 1 or 2 days and had been having increasing confusion.  In the ED, patient was on nasal cannula oxygen saturating 94 to 100% on 4 L nasal cannula, with unclear baseline supplemental oxygen requirements.  Lab work was remarkable for hemoglobin of 12.0.  Ammonia was 39.  Total count was 3800.  Urinalysis did not show any white cells.  COVID-19 PCR was negative.  EKG showed atrial fibrillation.  Chest x-ray showed moderate to large right-sided pleural effusion with interval increase from 06/04/2022.  CT head scan was negative.  Patient was then considered for admission to hospital for further encephalopathy work-up new hypoxia requiring oxygen..   Assessment and plan.  Principal Problem:   Acute encephalopathy Active Problems:   PAF (paroxysmal atrial fibrillation) (HCC)   DM2 (diabetes mellitus, type 2) (HCC)   HTN (hypertension)   HLD (hyperlipidemia)   Hx of BKA, left (HCC)   Overdose by ingestion   Pleural effusion on right   Pressure injury of skin   Anemia   Protein-calorie malnutrition, severe   Acute metabolic encephalopathy: Toxic/ metabolic in nature.  Could be polypharmacy as well, was on several outpatient medications as outpatient.  We will continue to discontinue meclizine and gabapentin today still appears to be slow and sluggish, better than on presentation..  We will add vitamin  H84, folic acid and MMA as well.   Acute hypoxia secondary to moderate to large right pleural effusion:  acute respiratory failure has been ruled out.  Left-sided  pleural effusion as well.  Albumin very low at 1.8.  Patient underwent ultrasound-guided right sided diagnostic and therapeutic thoracentesis with removal of 1.2 L of yellow fluid on 06/27/2022.  Fluid analysis shows transudative fluid.  Pleural fluid culture negative.  Gram stain with no organisms.  Procalcitonin was negative.  Patient is afebrile.Pneumonia ruled out.  Patient is still on supplemental oxygen 3 L/min.. Continue to wean off oxygen as able.Marland Kitchen  BNP elevated.  2D echocardiogram with preserved LV function at 55 to 69% and mild diastolic dysfunction..  Patient had marginally low blood pressure yesterday so unable to use Lasix and received 3 doses of IV albumin with improvement in blood pressure today.  We will try small dose of IV Lasix this morning at 20 milligrams.  Will obtain x-ray of the chest today for tapping and reassessment..  Check ABG due to encephalopathy.  Hypoalbuminemia/poor nutrition, failure to thrive, severe protein calorie malnutrition, present on admission.   We will get dietitian consult.  Improved blood pressure with albumin but low after Lasix.  We will give 1 more dose of albumin today.  Discussed with dietitian and possible feeding tube might be an alternative at this time.  Hypotension.  History of hypertension, antihypertensives on hold.  Received albumin infusion yesterday.  Appears to be a little volume overloaded so received IV Lasix.  Blood pressure again low.  We will give albumin again.  Has very low albumin levels.  chronic anemia.  Latest hemoglobin of 10.1.  MCV elevated at 102.  Check folic acid and vitamin B12 MMA.  Check labs from today.  Hypokalemia.  Replenished with p.o. potassium.  Will check labs from today.  Paroxysmal atrial fibrillation:  -CHA2DS2-VASc score of 4.  On beta-blocker  amiodarone and Eliquis.  Received 20 mg of IV Lasix in the ED in this morning.Marland Kitchen   Keep beta-blocker with holding parameters due to borderline low blood pressure.  GERD -continue PPI   Pressure injury: Present on admission.  Stage II left buttock pressure ulceration.  Continue wound care. Pressure Injury 06/18/22 Buttocks Left;Medial Stage 2 -  Partial thickness loss of dermis presenting as a shallow open injury with a red, pink wound bed without slough. blister that burst (Active)  06/18/22 0700  Location: Buttocks  Location Orientation: Left;Medial  Staging: Stage 2 -  Partial thickness loss of dermis presenting as a shallow open injury with a red, pink wound bed without slough.  Wound Description (Comments): blister that burst  Present on Admission: Yes    Hyperlipidemia:  -Continue statin.  Debility, deconditioning.  Patient is currently from skilled nursing facility.  Physical therapy has recommended skilled nursing facility at this time.  We will get palliative care consult for goals of care.       DVT prophylaxis: SCDs Start: 06/16/22 2317 apixaban (ELIQUIS) tablet 5 mg   Code Status:     Code Status: Full Code  Disposition: Skilled nursing facility likely in 2 to 3 days.  Status is: Inpatient  Remains inpatient appropriate because: Metabolic encephalopathy, poor intake, hypotension, pleural effusion, pending clinical improvement.   Family Communication:  Spoke with the patient's wife on the phone and updated her about the clinical condition of the patient on 06/21/2022.  Discussed about goals of care and palliative care involvement.  She is going to take a look at the living will.  She is okay with the idea of temporary feeding tube but will need to continue ongoing discussion with the family about this patient.  Consultants:  Neurology  Procedures:  IR guided thoracocentesis on the right side on 06/17/2022.  Antimicrobials:  None  Anti-infectives (From admission,  onward)    None      Subjective:  Today, patient was seen and examined at bedside.  Appears to be slow to respond with poor oral intake as per the nursing staff.  Appears somnolent.  Denies any pain, nausea, vomiting but poor historian.  Objective: Vitals:   06/21/22 0619 06/21/22 0809 06/21/22 1100 06/21/22 1124  BP: 108/66 101/69 (!) 89/61 95/60  Pulse: (!) 104 97  93  Resp: '18 20 18 18  '$ Temp: 97.7 F (36.5 C) 98 F (36.7 C)  97.7 F (36.5 C)  TempSrc: Oral Oral  Oral  SpO2:  96% 98% 99%  Weight:      Height:        Intake/Output Summary (Last 24 hours) at 06/21/2022 1630 Last data filed at 06/21/2022 1100 Gross per 24 hour  Intake 37.66 ml  Output 600 ml  Net -562.34 ml   Filed Weights   06/16/22 1453 06/19/22 0500 06/20/22 0429  Weight: 91.5 kg 101 kg 99.4 kg    Physical Examination: Body mass index is 31.44 kg/m.   General: Obese built, not in obvious distress, on nasal cannula oxygen, elderly male weak deconditioned.  mildly somnolent HENT:   No scleral pallor or icterus noted. Oral mucosa is moist.  Chest:  Diminished breath sounds bilaterally.  CVS: S1 &S2 heard. No murmur.  Regular rate and rhythm. Abdomen: Soft, nontender, nondistended.  Bowel sounds are heard.   Extremities: Left below-knee amputation, right lower extremity edema. Psych: Mildly somnolent but able to converse, slow to speech, CNS: Moves all extremities. Skin: Warm and dry.  No rashes noted.  Data Reviewed:   CBC: Recent Labs  Lab 06/16/22 1510 06/17/22 0342 06/17/22 4315 06/18/22 0820 06/19/22 0202 06/20/22 0811 06/21/22 1120  WBC 3.8* 5.1  --  8.4 9.2 4.8 4.8  NEUTROABS 2.5 3.0  --  5.8  --   --   --   HGB 12.0* 11.8* 11.6* 12.3* 13.6 10.1* 10.3*  HCT 37.3* 36.8* 34.0* 37.0* 40.9 31.0* 30.8*  MCV 107.5* 109.5*  --  102.8* 102.0* 102.0* 101.3*  PLT 149* 163  --  175 186 PLATELET CLUMPS NOTED ON SMEAR, COUNT APPEARS DECREASED 104*    Basic Metabolic Panel: Recent Labs   Lab 06/16/22 1510 06/17/22 0342 06/17/22 0937 06/18/22 0820 06/19/22 0202 06/20/22 0811 06/21/22 1120  NA 140 140 141 137 137 137 138  K 3.7 4.2 3.8 4.0 3.5 3.2* 3.4*  CL 110 112*  --  110 108 109 107  CO2 26 23  --  '22 23 25 25  '$ GLUCOSE 116* 83  --  83 117* 78 106*  BUN 15 13  --  '12 12 13 12  '$ CREATININE 0.63 0.56*  --  0.61 0.66 0.59* 0.62  CALCIUM 8.1* 8.2*  --  8.1* 8.3* 8.0* 8.6*  MG 1.8 1.8  --   --   --  1.9 1.8    Liver Function Tests: Recent Labs  Lab 06/16/22 1510 06/17/22 0342 06/18/22 0820  AST 51* 38 29  ALT 33 33 25  ALKPHOS 196* 176* 166*  BILITOT 0.7 0.8 0.8  PROT 5.4* 5.4* 5.3*  ALBUMIN 1.8* 1.8* 1.8*     Radiology Studies: DG Abd Portable 1V  Result Date: 06/21/2022 CLINICAL DATA:  Feeding tube placement EXAM: PORTABLE ABDOMEN - 1 VIEW COMPARISON:  None Available. FINDINGS: Tip of feeding tube is seen in the distal antrum of the stomach. There is mild dilation of small bowel loops. IMPRESSION: Tip of feeding tube is seen in the distal antrum of the stomach. Electronically Signed   By: Elmer Picker M.D.   On: 06/21/2022 16:15   DG CHEST PORT 1 VIEW  Result Date: 06/21/2022 CLINICAL DATA:  Right pleural effusion EXAM: PORTABLE CHEST 1 VIEW COMPARISON:  Chest 06/17/2022 FINDINGS: Extensive infiltrate throughout the right lung which has progressed considerably. Moderate right effusion has progressed. Left lower lobe infiltrate and effusion with mild improvement. IMPRESSION: Extensive infiltrate throughout the right lung with progression. Progressive right pleural effusion. Possible pneumonia Left lower lobe infiltrate and effusion with mild improvement. Electronically Signed   By: Franchot Gallo M.D.   On: 06/21/2022 15:51   ECHOCARDIOGRAM COMPLETE  Result Date: 06/20/2022    ECHOCARDIOGRAM REPORT   Patient Name:   VICTOR LANGENBACH Date of Exam: 06/20/2022 Medical Rec #:  [id number]     Height:       70.0 in Accession #:    [id number]    Weight:        219.1 lb Date of Birth:  [birth year]     BSA:          2.170 m Patient Age:    67 years      BP:           87/53 mmHg Patient  Gender: M             HR:           64 bpm. Exam Location:  Inpatient Procedure: 2D Echo, Cardiac Doppler and Color Doppler Indications:    Pleural effusion  History:        Patient has prior history of Echocardiogram examinations, most                 recent 04/19/2016. Risk Factors:Hypertension, Diabetes and                 Dyslipidemia.  Sonographer:    Jefferey Pica Referring Phys: [id number] [place]  1. Left ventricular ejection fraction, by estimation, is 55 to 60%. The left ventricle has normal function. The left ventricle has no regional wall motion abnormalities. Left ventricular diastolic parameters are indeterminate.  2. Right ventricular systolic function is low normal. The right ventricular size is normal. There is mildly elevated pulmonary artery systolic pressure. The estimated right ventricular systolic pressure is 42.6 mmHg.  3. Large pleural effusion in the left lateral region.  4. The mitral valve is grossly normal. Mild to moderate mitral valve regurgitation.  5. The aortic valve was not well visualized. Aortic valve regurgitation is trivial. No aortic stenosis is present. Comparison(s): MR worse from prior report. FINDINGS  Left Ventricle: Left ventricular ejection fraction, by estimation, is 55 to 60%. The left ventricle has normal function. The left ventricle has no regional wall motion abnormalities. The left ventricular internal cavity size was small. There is no left ventricular hypertrophy. Left ventricular diastolic parameters are indeterminate. Right Ventricle: The right ventricular size is normal. No increase in right ventricular wall thickness. Right ventricular systolic function is low normal. There is mildly elevated pulmonary artery systolic pressure. The tricuspid regurgitant velocity is 2.34 m/s, and with an assumed right atrial pressure of  15 mmHg, the estimated right ventricular systolic pressure is 83.4 mmHg. Left Atrium: Left atrial size was normal in size. Right Atrium: Right atrial size was normal in size. Pericardium: There is no evidence of pericardial effusion. Mitral Valve: The mitral valve is grossly normal. Mild to moderate mitral annular calcification. Mild to moderate mitral valve regurgitation. Tricuspid Valve: The tricuspid valve is normal in structure. Tricuspid valve regurgitation is not demonstrated. No evidence of tricuspid stenosis. Aortic Valve: The aortic valve was not well visualized. Aortic valve regurgitation is trivial. No aortic stenosis is present. Aortic valve peak gradient measures 6.5 mmHg. Pulmonic Valve: The pulmonic valve was not well visualized. Pulmonic valve regurgitation is not visualized. Aorta: The aortic root is normal in size and structure and the ascending aorta was not well visualized. IAS/Shunts: No atrial level shunt detected by color flow Doppler. Additional Comments: There is a large pleural effusion in the left lateral region.  LEFT VENTRICLE PLAX 2D LVIDd:         3.60 cm LVIDs:         2.80 cm LV PW:         0.90 cm LV IVS:        1.00 cm LVOT diam:     1.80 cm LV SV:         45 LV SV Index:   21 LVOT Area:     2.54 cm  RIGHT VENTRICLE          IVC RV Basal diam:  3.10 cm  IVC diam: 2.10 cm LEFT ATRIUM  Index        RIGHT ATRIUM           Index LA diam:        3.60 cm 1.66 cm/m   RA Area:     14.00 cm LA Vol (A2C):   45.3 ml 20.88 ml/m  RA Volume:   34.70 ml  15.99 ml/m LA Vol (A4C):   43.6 ml 20.09 ml/m LA Biplane Vol: 46.7 ml 21.52 ml/m  AORTIC VALVE                 PULMONIC VALVE AV Area (Vmax): 1.54 cm     PV Vmax:       0.63 m/s AV Vmax:        127.00 cm/s  PV Peak grad:  1.6 mmHg AV Peak Grad:   6.5 mmHg LVOT Vmax:      76.70 cm/s LVOT Vmean:     51.800 cm/s LVOT VTI:       0.176 m  AORTA Ao Root diam: 3.20 cm MR Peak grad: 48.2 mmHg   TRICUSPID VALVE MR Vmax:      347.00 cm/s  TR Peak grad:   21.9 mmHg                           TR Vmax:        234.00 cm/s                            SHUNTS                           Systemic VTI:  0.18 m                           Systemic Diam: 1.80 cm Rudean Haskell MD Electronically signed by Rudean Haskell MD Signature Date/Time: 06/20/2022/11:13:16 AM    Final       LOS: 5 days    Flora Lipps, MD [facility] Available via Epic secure chat 7am-7pm After these hours, please refer to coverage provider listed on [email or web address] 06/21/2022, 4:30 PM

## 2022-06-22 ENCOUNTER — Ambulatory Visit: Payer: Medicare Other | Admitting: Cardiology

## 2022-06-22 ENCOUNTER — Inpatient Hospital Stay (HOSPITAL_COMMUNITY): Payer: Medicare Other

## 2022-06-22 DIAGNOSIS — E43 Unspecified severe protein-calorie malnutrition: Secondary | ICD-10-CM

## 2022-06-22 DIAGNOSIS — J9601 Acute respiratory failure with hypoxia: Secondary | ICD-10-CM | POA: Diagnosis not present

## 2022-06-22 DIAGNOSIS — I480 Paroxysmal atrial fibrillation: Secondary | ICD-10-CM | POA: Diagnosis not present

## 2022-06-22 DIAGNOSIS — E782 Mixed hyperlipidemia: Secondary | ICD-10-CM | POA: Diagnosis not present

## 2022-06-22 DIAGNOSIS — I4891 Unspecified atrial fibrillation: Secondary | ICD-10-CM

## 2022-06-22 DIAGNOSIS — G9340 Encephalopathy, unspecified: Secondary | ICD-10-CM | POA: Diagnosis not present

## 2022-06-22 DIAGNOSIS — J90 Pleural effusion, not elsewhere classified: Secondary | ICD-10-CM | POA: Diagnosis not present

## 2022-06-22 LAB — GLUCOSE, CAPILLARY
Glucose-Capillary: 130 mg/dL — ABNORMAL HIGH (ref 70–99)
Glucose-Capillary: 140 mg/dL — ABNORMAL HIGH (ref 70–99)
Glucose-Capillary: 142 mg/dL — ABNORMAL HIGH (ref 70–99)
Glucose-Capillary: 166 mg/dL — ABNORMAL HIGH (ref 70–99)
Glucose-Capillary: 168 mg/dL — ABNORMAL HIGH (ref 70–99)
Glucose-Capillary: 190 mg/dL — ABNORMAL HIGH (ref 70–99)
Glucose-Capillary: 202 mg/dL — ABNORMAL HIGH (ref 70–99)

## 2022-06-22 LAB — BLOOD GAS, ARTERIAL
Acid-Base Excess: 2.8 mmol/L — ABNORMAL HIGH (ref 0.0–2.0)
Bicarbonate: 27.9 mmol/L (ref 20.0–28.0)
Drawn by: 252031
O2 Saturation: 96.8 %
Patient temperature: 37.8
pCO2 arterial: 46 mmHg (ref 32–48)
pH, Arterial: 7.4 (ref 7.35–7.45)
pO2, Arterial: 73 mmHg — ABNORMAL LOW (ref 83–108)

## 2022-06-22 LAB — COMPREHENSIVE METABOLIC PANEL
ALT: 28 U/L (ref 0–44)
AST: 36 U/L (ref 15–41)
Albumin: 2.6 g/dL — ABNORMAL LOW (ref 3.5–5.0)
Alkaline Phosphatase: 195 U/L — ABNORMAL HIGH (ref 38–126)
Anion gap: 7 (ref 5–15)
BUN: 12 mg/dL (ref 8–23)
CO2: 26 mmol/L (ref 22–32)
Calcium: 8.6 mg/dL — ABNORMAL LOW (ref 8.9–10.3)
Chloride: 106 mmol/L (ref 98–111)
Creatinine, Ser: 0.64 mg/dL (ref 0.61–1.24)
GFR, Estimated: >60 mL/min (ref >60)
Glucose, Bld: 130 mg/dL — ABNORMAL HIGH (ref 70–99)
Potassium: 3.8 mmol/L (ref 3.5–5.1)
Sodium: 139 mmol/L (ref 135–145)
Total Bilirubin: 1.3 mg/dL — ABNORMAL HIGH (ref 0.3–1.2)
Total Protein: 6.2 g/dL — ABNORMAL LOW (ref 6.5–8.1)

## 2022-06-22 LAB — CBC
HCT: 33.7 % — ABNORMAL LOW (ref 39.0–52.0)
Hemoglobin: 11.5 g/dL — ABNORMAL LOW (ref 13.0–17.0)
MCH: 34.6 pg — ABNORMAL HIGH (ref 26.0–34.0)
MCHC: 34.1 g/dL (ref 30.0–36.0)
MCV: 101.5 fL — ABNORMAL HIGH (ref 80.0–100.0)
Platelets: 169 10*3/uL (ref 150–400)
RBC: 3.32 MIL/uL — ABNORMAL LOW (ref 4.22–5.81)
RDW: 13.8 % (ref 11.5–15.5)
WBC: 9.2 10*3/uL (ref 4.0–10.5)
nRBC: 0 % (ref 0.0–0.2)

## 2022-06-22 LAB — CULTURE, BODY FLUID W GRAM STAIN -BOTTLE: Culture: NO GROWTH

## 2022-06-22 LAB — MAGNESIUM
Magnesium: 2.1 mg/dL (ref 1.7–2.4)
Magnesium: 2 mg/dL (ref 1.7–2.4)

## 2022-06-22 LAB — PHOSPHORUS
Phosphorus: 3.2 mg/dL (ref 2.5–4.6)
Phosphorus: 3 mg/dL (ref 2.5–4.6)

## 2022-06-22 MED ORDER — FUROSEMIDE 10 MG/ML IJ SOLN
20.0000 mg | Freq: Once | INTRAMUSCULAR | Status: AC
  Administered 2022-06-22: 20 mg via INTRAVENOUS
  Filled 2022-06-22: qty 4

## 2022-06-22 MED ORDER — FOLIC ACID 1 MG PO TABS
1.0000 mg | ORAL_TABLET | Freq: Every day | ORAL | Status: DC
  Administered 2022-06-23 – 2022-07-12 (×20): 1 mg
  Filled 2022-06-22 (×20): qty 1

## 2022-06-22 MED ORDER — FUROSEMIDE 10 MG/ML IJ SOLN
40.0000 mg | INTRAMUSCULAR | Status: AC
  Administered 2022-06-22: 40 mg via INTRAVENOUS
  Filled 2022-06-22: qty 4

## 2022-06-22 MED ORDER — APIXABAN 5 MG PO TABS: 5.0000 mg | ORAL_TABLET | Freq: Two times a day (BID) | ORAL | Status: DC

## 2022-06-22 MED ORDER — SODIUM CHLORIDE 0.9 % IV SOLN
500.0000 mg | INTRAVENOUS | Status: DC
  Administered 2022-06-22: 500 mg via INTRAVENOUS
  Filled 2022-06-22 (×2): qty 5

## 2022-06-22 MED ORDER — ACETAMINOPHEN 325 MG PO TABS
650.0000 mg | ORAL_TABLET | Freq: Four times a day (QID) | ORAL | Status: DC | PRN
  Administered 2022-06-22 – 2022-06-28 (×3): 650 mg
  Filled 2022-06-22 (×3): qty 2

## 2022-06-22 MED ORDER — TOPIRAMATE 25 MG PO TABS
75.0000 mg | ORAL_TABLET | Freq: Every day | ORAL | Status: DC
  Administered 2022-06-23 – 2022-07-03 (×11): 75 mg
  Filled 2022-06-22 (×11): qty 3

## 2022-06-22 MED ORDER — ACETAMINOPHEN 650 MG RE SUPP: 650.0000 mg | Freq: Four times a day (QID) | RECTAL | Status: DC | PRN

## 2022-06-22 MED ORDER — SODIUM CHLORIDE 0.9 % IV SOLN
1.0000 g | INTRAVENOUS | Status: DC
  Administered 2022-06-22: 1 g via INTRAVENOUS
  Filled 2022-06-22 (×2): qty 10

## 2022-06-22 MED ORDER — POLYETHYLENE GLYCOL 3350 17 G PO PACK
17.0000 g | PACK | Freq: Every day | ORAL | Status: DC
  Administered 2022-06-23 – 2022-07-07 (×11): 17 g
  Filled 2022-06-22 (×13): qty 1

## 2022-06-22 MED ORDER — AMITRIPTYLINE HCL 50 MG PO TABS
50.0000 mg | ORAL_TABLET | Freq: Every day | ORAL | Status: DC
  Administered 2022-06-22 – 2022-07-02 (×11): 50 mg
  Filled 2022-06-22 (×10): qty 1
  Filled 2022-06-22: qty 2
  Filled 2022-06-22 (×2): qty 1

## 2022-06-22 MED ORDER — METOPROLOL SUCCINATE ER 50 MG PO TB24
25.0000 mg | ORAL_TABLET | Freq: Two times a day (BID) | ORAL | Status: DC
  Administered 2022-06-22: 25 mg via ORAL
  Filled 2022-06-22: qty 1

## 2022-06-22 MED ORDER — AMIODARONE HCL 200 MG PO TABS
200.0000 mg | ORAL_TABLET | Freq: Two times a day (BID) | ORAL | Status: DC
  Administered 2022-06-22 – 2022-07-03 (×22): 200 mg
  Filled 2022-06-22 (×22): qty 1

## 2022-06-22 MED ORDER — ATORVASTATIN CALCIUM 40 MG PO TABS
40.0000 mg | ORAL_TABLET | Freq: Every day | ORAL | Status: DC
  Administered 2022-06-23 – 2022-07-03 (×11): 40 mg
  Filled 2022-06-22 (×11): qty 1

## 2022-06-22 NOTE — Progress Notes (Addendum)
Brief Progress Note:  Contacted by hospitalist at [facility].  Poor protoplasm with ongoing issue with bilateral pleural effusions, anasarca.  Difficulty achieving adequate diuresis with reported borderline or low blood pressures.  Worsening hypoxemia through the night.  Review of chest x-ray this morning compared to this evening shows worsening pleural effusions as well as worsening pulmonary edema.  On exam, he has diminished breath sounds.  He is encephalopathic but easily arousable.  No increased work of breathing.  Satting low to mid 90s on 15 L salter.  His hypoxemia proportion to the pleural effusions are most related to edema.  It is possible that thoracentesis may improve his hypoxemia minimally but is also very likely that he will reaccumulate fluidas he has demonstrated time and time again with recent thoracentesis and reaccumulation.  While his procedures are a bridge, they are relatively futile without adequate and aggressive diuresis.  -- Repeat Lasix 40 mg IV now --Recommend better heart rate control, can see if or metoprolol will slow his heart rate and get a mild RVR, but recommend additional interventions as RVR is certainly contributing to his worsening edema and volume overload --Recommend BiPAP if hypoxemia or work of breathing worsens --Can reconsider expedited thoracentesis if clinical status deteriorates.

## 2022-06-22 NOTE — Progress Notes (Signed)
Paged [facility] regarding low grade fever and increased O2 demand. Patient is alert and following commands, no change from bedside report.   Will monitor closely for any signs of decline.

## 2022-06-22 NOTE — Consult Note (Signed)
NAME:  Darrell Horne, MRN:  [id number], DOB:  [birth year], LOS: 6 ADMISSION DATE:  06/16/2022, CONSULTATION DATE:  06/22/2022 REFERRING MD:  Dr. Louanne Belton, CHIEF COMPLAINT:  Acute Hypoxic Respiratory Failure, Pleural Effusion   History of Present Illness:  Darrell Horne is a 76 year old male with a past medical history of paroxysmal A-fib on Eliquis, left BKA, HTN, HLD, and alcoholic cirrhosis who presented to the hospital with altered mental status and was admitted on 06/16/2022.  He was found to have a large right-sided pleural effusion and his intermittently increased since August 26.  Thoracentesis on 06/17/2022 81 with 2 L of yellow fluid was found to be transudative pleural effusion.  Initially he did well after the procedure but over the past couple days has had increased oxygen requirements and episodes of hypotension.  Hypotension was treated with albumin and midodrine.  PCCM was consulted for further evaluation and management of his right-sided pleural effusion and acute hypoxic respiratory failure.  Pertinent  Medical History   Past Medical History:  Diagnosis Date   Chest pain    Diabetes mellitus    DM2 (diabetes mellitus, type 2) (HCC)    GERD (gastroesophageal reflux disease)    HLD (hyperlipidemia)    HTN (hypertension)    PAF (paroxysmal atrial fibrillation) ([place])    Palpitations      Significant Hospital Events: Including procedures, antibiotic start and stop dates in addition to other pertinent events   9/8 thoracentesis with return of 1.2 L of transudative fluid 9/12 increasing oxygen requirements with up to 6 L high flow nasal cannula and episodes of hypotension with systolic of 89.  Albumin, midodrine, and tube feeds started. 9/13 PCCM consulted, Eliquis held for planned thoracentesis tomorrow  Interim History / Subjective:  See HPI  Objective   Blood pressure 109/66, pulse (!) 119, temperature 98.7 F (37.1 C), temperature source Oral, resp. rate 16, height '5\' 10"'$  (1.778  m), weight 99.4 kg, SpO2 92 %.        Intake/Output Summary (Last 24 hours) at 06/22/2022 1446 Last data filed at 06/22/2022 1337 Gross per 24 hour  Intake 100 ml  Output 1550 ml  Net -1450 ml   Filed Weights   06/16/22 1453 06/19/22 0500 06/20/22 0429  Weight: 91.5 kg 101 kg 99.4 kg    Examination: General: Ill-appearing elderly gentleman sitting in bed in mild respiratory distress HENT: [place]/AT Lungs: Diminished lung sounds right greater than left Cardiovascular: Irregularly irregular rhythm with a regular rate Abdomen: Soft and nontender Extremities: Mild to moderate edema in the bilateral upper and lower extremities Neuro: Alert and oriented x3  Resolved Hospital Problem list     Assessment & Plan:  Right-sided pleural effusion Acute hypoxic respiratory failure Patient requiring 5 L of nasal cannula oxygen which has been stable today but is worsened over the past few days.  Thoracentesis on 06/17/2022 showed 1.2 L of transudative yellow fluid.  Chest x-ray today shows worsening pulmonary edema and stable right pleural effusion.  Echocardiogram on 06/20/2022 showed EF of 55 to 60% with right ventricular systolic dysfunction and mildly elevated right ventricular pressures.  No signs of pericarditis.  Patient has a history of alcoholic cirrhosis, with the transudative effusion predominantly on the right this could be the cause.  After speaking to his healthcare power of attorney Nieko Clarin we will plan to proceed with thoracentesis tomorrow and hold his Eliquis tonight. - Hold Eliquis for planned thoracentesis tomorrow  Best Practice (right click and "Reselect all SmartList  Selections" daily)   Diet/type: tubefeeds DVT prophylaxis: DOAC on hold for planned procedure GI prophylaxis: N/A Lines: N/A Foley:  N/A Code Status:  full code Last date of multidisciplinary goals of care discussion [06/22/2022]  Labs   CBC: Recent Labs  Lab 06/16/22 1510 06/17/22 0342 06/17/22 0937  06/18/22 0820 06/19/22 0202 06/20/22 0811 06/21/22 1120 06/22/22 0343  WBC 3.8* 5.1  --  8.4 9.2 4.8 4.8 9.2  NEUTROABS 2.5 3.0  --  5.8  --   --   --   --   HGB 12.0* 11.8*   < > 12.3* 13.6 10.1* 10.3* 11.5*  HCT 37.3* 36.8*   < > 37.0* 40.9 31.0* 30.8* 33.7*  MCV 107.5* 109.5*  --  102.8* 102.0* 102.0* 101.3* 101.5*  PLT 149* 163  --  175 186 PLATELET CLUMPS NOTED ON SMEAR, COUNT APPEARS DECREASED 104* 169   < > = values in this interval not displayed.    Basic Metabolic Panel: Recent Labs  Lab 06/17/22 0342 06/17/22 0937 06/18/22 0820 06/19/22 0202 06/20/22 0811 06/21/22 1120 06/21/22 1904 06/22/22 0343  NA 140   < > 137 137 137 138  --  139  K 4.2   < > 4.0 3.5 3.2* 3.4*  --  3.8  CL 112*  --  110 108 109 107  --  106  CO2 23  --  '22 23 25 25  '$ --  26  GLUCOSE 83  --  83 117* 78 106*  --  130*  BUN 13  --  '12 12 13 12  '$ --  12  CREATININE 0.56*  --  0.61 0.66 0.59* 0.62  --  0.64  CALCIUM 8.2*  --  8.1* 8.3* 8.0* 8.6*  --  8.6*  MG 1.8  --   --   --  1.9 1.8 2.2 2.1  PHOS  --   --   --   --   --   --  3.5 3.2   < > = values in this interval not displayed.   GFR: Estimated Creatinine Clearance: 92.9 mL/min (by C-G formula based on SCr of 0.64 mg/dL). Recent Labs  Lab 06/16/22 1510 06/17/22 0342 06/19/22 0202 06/20/22 0811 06/21/22 1120 06/22/22 0343  PROCALCITON <0.10  --   --   --   --   --   WBC 3.8*   < > 9.2 4.8 4.8 9.2   < > = values in this interval not displayed.    Liver Function Tests: Recent Labs  Lab 06/16/22 1510 06/17/22 0342 06/18/22 0820 06/22/22 0343  AST 51* 38 29 36  ALT 33 33 25 28  ALKPHOS 196* 176* 166* 195*  BILITOT 0.7 0.8 0.8 1.3*  PROT 5.4* 5.4* 5.3* 6.2*  ALBUMIN 1.8* 1.8* 1.8* 2.6*   No results for input(s): "LIPASE", "AMYLASE" in the last 168 hours. Recent Labs  Lab 06/16/22 1510  AMMONIA 39*    ABG    Component Value Date/Time   PHART 7.4 06/21/2022 1118   PCO2ART 44 06/21/2022 1118   PO2ART 87 06/21/2022  1118   HCO3 27.3 06/21/2022 1118   TCO2 29 06/17/2022 0937   O2SAT 98.8 06/21/2022 1118     Coagulation Profile: No results for input(s): "INR", "PROTIME" in the last 168 hours.  Cardiac Enzymes: Recent Labs  Lab 06/17/22 0342  CKTOTAL 35*    HbA1C: Hgb A1c MFr Bld  Date/Time Value Ref Range Status  04/19/2022 10:39 AM 4.8 <5.7 % of total Hgb  Final    Comment:    For the purpose of screening for the presence of diabetes: . <5.7%       Consistent with the absence of diabetes 5.7-6.4%    Consistent with increased risk for diabetes             (prediabetes) > or =6.5%  Consistent with diabetes . This assay result is consistent with a decreased risk of diabetes. . Currently, no consensus exists regarding use of hemoglobin A1c for diagnosis of diabetes in children. . According to American Diabetes Association (ADA) guidelines, hemoglobin A1c <7.0% represents optimal control in non-pregnant diabetic patients. Different metrics may apply to specific patient populations.  Standards of Medical Care in Diabetes(ADA). Marland Kitchen   03/24/2021 10:52 AM 5.6 <5.7 % of total Hgb Final    Comment:    For the purpose of screening for the presence of diabetes: . <5.7%       Consistent with the absence of diabetes 5.7-6.4%    Consistent with increased risk for diabetes             (prediabetes) > or =6.5%  Consistent with diabetes . This assay result is consistent with a decreased risk of diabetes. . Currently, no consensus exists regarding use of hemoglobin A1c for diagnosis of diabetes in children. . According to American Diabetes Association (ADA) guidelines, hemoglobin A1c <7.0% represents optimal control in non-pregnant diabetic patients. Different metrics may apply to specific patient populations.  Standards of Medical Care in Diabetes(ADA). .     CBG: Recent Labs  Lab 06/21/22 2007 06/22/22 0010 06/22/22 0358 06/22/22 0819 06/22/22 1203  GLUCAP 123* 130* 140* 168*  190*    Review of Systems:   Dyspnea.  Past Medical History:  He,  has a past medical history of Chest pain, Diabetes mellitus, DM2 (diabetes mellitus, type 2) ([place]), GERD (gastroesophageal reflux disease), HLD (hyperlipidemia), HTN (hypertension), PAF (paroxysmal atrial fibrillation) ([place]), and Palpitations.   Surgical History:   Past Surgical History:  Procedure Laterality Date   APPENDECTOMY     CHOLECYSTECTOMY     COLONOSCOPY     Dr. Michail Sermon   COLONOSCOPY WITH PROPOFOL N/A 11/06/2018   Procedure: COLONOSCOPY WITH PROPOFOL;  Surgeon: Wilford Corner, MD;  Location: [facility] END[facility]COPY;  Service: Endoscopy;  Laterality: N/A;   HERNIA REPAIR     IR THORACENTESIS ASP PLEURAL SPACE W/IMG GUIDE  06/17/2022   LEG AMPUTATION Left [id number]   Dr. Kendell Bane   POLYPECTOMY  11/06/2018   Procedure: POLYPECTOMY;  Surgeon: Wilford Corner, MD;  Location: [facility] ENDOSCOPY;  Service: Endoscopy;;   SHOULDER SURGERY  [id number]   Dr. Meridee Score   TOTAL ANKLE ARTHROPLASTY  [id number]     Social History:   reports that he quit smoking about 40 years ago. His smoking use included cigarettes. He has never used smokeless tobacco. He reports current alcohol use. He reports that he does not use drugs.   Family History:  His family history includes Cancer in his sister; Diabetes in his sister; Heart disease in his mother; Stroke (age of onset: 54) in his father.   Allergies No Known Allergies   Home Medications  Prior to Admission medications   Medication Sig Start Date End Date Taking? Authorizing Provider  acetaminophen (TYLENOL) 500 MG tablet Take 1 tablet (500 mg total) by mouth every 6 (six) hours as needed for moderate pain. 05/27/22  Yes Ngetich, Dinah C, NP  acetaminophen (TYLENOL) 650 MG CR tablet Take 650 mg by mouth See  admin instructions. Give 1 tablet (650 mg) every 4 to 6 hours as needed for pain or oral temp >100, rectal temp >102 **DO NOT GIVE MORE THAN 3,000 MG (Combined) IN 24 HOURS**    Yes [provider]  alum & mag hydroxide-simeth (MAALOX PLUS) 400-400-40 MG/5ML suspension Take 30 mLs by mouth 3 (three) times daily as needed for indigestion (Not to exceed 3 doses daily. Inform MD if used more than 3 days.). **Do not give at same time as antibiotics, Digoxin or Coumadin**   Yes [provider]  Amino Acids-Protein Hydrolys (PRO-STAT AWC) LIQD Take 30 mLs by mouth in the morning and at bedtime.   Yes [provider]  amiodarone (PACERONE) 200 MG tablet Take 1 tablet (200 mg total) by mouth 2 (two) times daily. 05/10/22  Yes Jerline Pain, MD  amitriptyline (ELAVIL) 50 MG tablet TAKE 1 TABLET BY MOUTH EVERYDAY AT BEDTIME Patient taking differently: Take 50 mg by mouth at bedtime. 02/11/21  Yes Ngetich, Dinah C, NP  apixaban (ELIQUIS) 5 MG TABS tablet Take 1 tablet (5 mg total) by mouth 2 (two) times daily. 05/20/21  Yes Ngetich, Dinah C, NP  atorvastatin (LIPITOR) 40 MG tablet Take 1 tablet (40 mg total) by mouth daily. 08/07/20  Yes Ngetich, Dinah C, NP  bisacodyl (DULCOLAX) 10 MG suppository Place 10 mg rectally as needed for moderate constipation (If no bowel movement within 72 hours or Milk of Magnesia, May give 1 suppository rectally (or 1 tablet) as needed for constipation and inform MD.).   Yes [provider]  bisacodyl (DULCOLAX) 5 MG EC tablet Take 5 mg by mouth as needed for moderate constipation (If no bowel movement within 72 hours or Milk of Magnesia, May give 1 tablet by mouth (or 1 suppository) as needed for constipation and inform MD).   Yes [provider]  folic acid (FOLVITE) 1 MG tablet TAKE 1 TABLET BY MOUTH EVERY DAY Patient taking differently: Take 1 mg by mouth daily. 06/03/21  Yes Ngetich, Dinah C, NP  gabapentin (NEURONTIN) 600 MG tablet Take 0.5 tablets (300 mg total) by mouth 3 (three) times daily. 06/10/22  Yes Ghimire, Henreitta Leber, MD  ibuprofen (ADVIL) 200 MG tablet Take 200 mg by mouth every 6 (six) hours as needed  for fever, mild pain or moderate pain (For pain, oral temp >100, rectal temp >102 (or acetaminophen 650 mg by mouth every 4 hours)). **DO NOT GIVE MORE THAN 3,000 mg (Combined) IN 24 HOURS**   Yes [provider]  ketoconazole (NIZORAL) 2 % cream Apply 1 fingertip amount to each foot daily. 05/21/21  Yes Evelina Bucy, DPM  lidocaine (LIDODERM) 5 % Place 1 patch onto the skin daily as needed (For back pain). 06/10/22  Yes [provider]  loperamide (IMODIUM A-D) 2 MG tablet Take 2-4 mg by mouth as needed for diarrhea or loose stools (Give 2 tablets (4 mg) at onset of diarrhea, then 1 tablet (2 mg) after each stool. (Not to exceed 16 mg total) If diarrhea persists for 36 hours, notify MD.).   Yes [provider]  magnesium hydroxide (MILK OF MAGNESIA) 400 MG/5ML suspension Take 30 mLs by mouth 2 (two) times daily as needed for mild constipation or moderate constipation (*Use Miralax if Milk of Magnesia IS CONTRANDICATED (example: Dialysis resident)*).   Yes [provider]  meclizine (DRAMAMINE) 25 MG tablet Take 25 mg by mouth as needed for nausea (May repeat in 1 hour if ineffective. If  nausea persists after second dose, call MD.).   Yes [provider]  metoprolol succinate (TOPROL-XL) 25 MG 24 hr tablet Take 1 tablet (25 mg total) by mouth daily. Take with or immediately following a meal. Patient taking differently: Take 12.5 mg by mouth daily. Take with or immediately following a meal. 06/10/22  Yes Ghimire, Henreitta Leber, MD  nystatin cream (MYCOSTATIN) Apply 1 Application topically 2 (two) times daily as needed (For cutaneous or mucocutaneous candida rash of abdomen folds, groin and under breasts for 21 days.). D/C order if rash is healed after 21 days. **Update MD if rash not resolved at that time**   Yes [provider]  omeprazole (PRILOSEC) 20 MG capsule TAKE 1 CAPSULE BY MOUTH EVERY DAY Patient taking differently: Take 20 mg by mouth daily.  01/28/21  Yes Ngetich, Dinah C, NP  Phenylephrine-DM-GG (ROBITUSSIN CHILD COUGH/COLD CF) 2.02-11-49 MG/5ML LIQD Take 10 mLs by mouth every 4 (four) hours as needed (For cough *Notify MD if cough persists, congestion x3 days or accompanied by fever*).   Yes [provider]  polyethylene glycol (MIRALAX / GLYCOLAX) 17 g packet Take 17 g by mouth daily as needed for mild constipation or moderate constipation (*Use Miralax if Milk of Magnesia IS CONTRAINDICATED (Example: Dialysis resident)*).   Yes [provider]  promethazine (PHENERGAN) 25 MG tablet Take 25 mg by mouth every 4 (four) hours as needed for nausea or vomiting (May use suppository if unable to take by mouth. Notify MD if nausea persists after second dose.).   Yes [provider]  silver sulfADIAZINE (SILVADENE) 1 % cream Apply 1 Application topically daily. 06/15/22  Yes [provider]  topiramate (TOPAMAX) 50 MG tablet TAKE 1 AND 1/2 TABLETS BY MOUTH DAILY Patient taking differently: Take 75 mg by mouth daily. 02/26/21  Yes Ngetich, Dinah C, NP  vitamin B-12 (CYANOCOBALAMIN) 1000 MCG tablet Take 1,000 mcg by mouth daily.   Yes [provider]  vitamin C (ASCORBIC ACID) 500 MG tablet Take 500 mg by mouth daily.    Yes [provider]     Critical care time: 40

## 2022-06-22 NOTE — Progress Notes (Signed)
RT and Pulmonary at bedside to evaluate patients oxygen needs due to requiring HF oxygen at 15L HFNC. Bi Pap tentative based on patient tolerance per provider.

## 2022-06-22 NOTE — Progress Notes (Addendum)
PROGRESS NOTE    JAMICHEAL HEARD  IPJ:[id number] DOB: Jul 28, 1946 DOA: 06/16/2022 PCP: Sandrea Hughs, NP    Brief Narrative:  Darrell Horne is a 76 y.o. male with medical history significant for paroxysmal atrial fibrillation chronically anticoagulated on Eliquis, status post left BKA, GERD, essential hypertension, hyperlipidemia, presented to the hospital with altered mental status from skilled nursing facility.  Of note, patient was recently hospitalized for encephalopathy which was thought to be secondary to unintentional overdose.  He was then discharged to skilled nursing facility but over the last 1 or 2 days and had been having increasing confusion.  In the ED, patient was on nasal cannula oxygen saturating 94 to 100% on 4 L nasal cannula, with unclear baseline supplemental oxygen requirements.  Lab work was remarkable for hemoglobin of 12.0.  Ammonia was 39.  Total count was 3800.  Urinalysis did not show any white cells.  COVID-19 PCR was negative.  EKG showed atrial fibrillation.  Chest x-ray showed moderate to large right-sided pleural effusion with interval increase from 06/04/2022.  CT head scan was negative.  Patient was then considered for admission to hospital for further encephalopathy work-up new hypoxia requiring oxygen..  During hospitalization, patient underwent thoracocentesis with removal of fluid from right pleural effusion.  It was noted to be transudative.  Patient however persisted to have hypoxia requiring higher flow of oxygen and was hypotensive.  Patient needed albumin infusions and was started on midodrine to assist with diuretics.  Dietary was consulted due to significantly low albumin which could be contributing to effusion.  Has been started on tube feeding since 06/21/2022.  Patient is overall debilitated weak and deconditioned.  Palliative care has been consulted to address goals of care.   Assessment and plan.  Principal Problem:   Acute encephalopathy Active  Problems:   PAF (paroxysmal atrial fibrillation) (HCC)   DM2 (diabetes mellitus, type 2) (HCC)   HTN (hypertension)   HLD (hyperlipidemia)   Hx of BKA, left (HCC)   Overdose by ingestion   Pleural effusion on right   Pressure injury of skin   Anemia   Protein-calorie malnutrition, severe   Acute metabolic encephalopathy: Toxic/ metabolic in nature.Could be factorial including polypharmacy, hypoxia.  Patient was on several outpatient medications as outpatient.  Off sedative medications as much as possible.  Have discontinued  meclizine and gabapentin since 06/21/2022.   vitamin B34, folic acid within normal range.     Acute hypoxic respiratory failure secondary to moderate to large right pleural effusion:  Now with dyspnea and shortness of breath.   Albumin very low at 1.8.  Patient underwent ultrasound-guided right sided diagnostic and therapeutic thoracentesis with removal of 1.2 L of yellow fluid on 06/27/2022.  Fluid analysis shows transudative fluid.  Pleural fluid culture negative.  Gram stain with no organisms.  Procalcitonin was negative.  Patient is afebrile.repeat chest x-ray with diffuse interstitial infiltrates.  Patient is still requiring high flow oxygen despite diuretics.  Possibility of underlying pneumonia.  Will empirically treat with Rocephin and Zithromax.  We will continue to diurese as possible.. Continue to wean off oxygen as able.Marland Kitchen  BNP elevated.  2D echocardiogram with preserved LV function at 55 to 19% and mild diastolic dysfunction, ABG was unremarkable.  We will continue to monitor.  Hypoalbuminemia/poor nutrition, failure to thrive, severe protein calorie malnutrition, present on admission.  Patient has been seen by dietitian and was put on cortrak tube tube after discussion with the patient's wife.  Nutrition following.  Monitoring of electrolytes closely.  Hypotension.  History of hypertension, blocker with holding parameters.  Received albumin infusion few times  followed by Lasix.  Had to be started on midodrine with improvement in blood pressure today.  We will give 1 more dose of IV Lasix this morning.  Chronic anemia.  Latest hemoglobin of 11.5.  MCV elevated at 102.  Folic acid level at 34.  Vitamin B12 at 586.  Pending MMA.   Hypokalemia.  Replenished with p.o. potassium.  Latest potassium of 3.8  Paroxysmal atrial fibrillation:  -CHA2DS2-VASc score of 4.  On beta-blocker amiodarone and Eliquis.   Keep beta-blocker with holding parameters due to borderline low blood pressure.  GERD -continue PPI   Pressure injury: Present on admission.  Stage II left buttock pressure ulceration.  Continue wound care. Pressure Injury 06/18/22 Buttocks Left;Medial Stage 2 -  Partial thickness loss of dermis presenting as a shallow open injury with a red, pink wound bed without slough. blister that burst (Active)  06/18/22 0700  Location: Buttocks  Location Orientation: Left;Medial  Staging: Stage 2 -  Partial thickness loss of dermis presenting as a shallow open injury with a red, pink wound bed without slough.  Wound Description (Comments): blister that burst  Present on Admission: Yes    Hyperlipidemia:  -Continue statin.  Debility, deconditioning.  Patient is currently from skilled nursing facility.  Physical therapy has recommended skilled nursing facility at this time.  We will get palliative care consult for goals of care.       DVT prophylaxis: SCDs Start: 06/16/22 2317 apixaban (ELIQUIS) tablet 5 mg   Code Status:     Code Status: Full Code  Disposition:  Skilled nursing facility likely in 2 to 3 days when clinically improved..  Status is: Inpatient  Remains inpatient appropriate because: Acute hypoxic respiratory failure, metabolic encephalopathy, poor intake, hypotension, pleural effusion, pending clinical improvement.   Family Communication:  Spoke with the patient's wife again today.  Discussed about the potential poor prognosis on  this patient with multiple comorbidities.  She is aware of this.  I have encouraged her to look at his living will to see what he wants.  She is hopeful that the patient would turn around.  Consultants:  Neurology  Procedures:  IR guided thoracocentesis on the right side on 06/17/2022.  Antimicrobials:  Rocephin and Zithromax 06/22/2022  Anti-infectives (From admission, onward)    Start     Dose/Rate Route Frequency Ordered Stop   06/22/22 1145  cefTRIAXone (ROCEPHIN) 1 g in sodium chloride 0.9 % 100 mL IVPB        1 g 200 mL/hr over 30 Minutes Intravenous Every 24 hours 06/22/22 1055     06/22/22 1145  azithromycin (ZITHROMAX) 500 mg in sodium chloride 0.9 % 250 mL IVPB        500 mg 250 mL/hr over 60 Minutes Intravenous Every 24 hours 06/22/22 1055        Subjective: Today, patient was seen and examined at bedside.  Nursing staff reported that he was somnolent and had some crackles yesterday.  Tube feeding was initiated yesterday.  Today, little more alert but on high flow oxygen.    Objective: Vitals:   06/22/22 0950 06/22/22 1005 06/22/22 1010 06/22/22 1012  BP:      Pulse:      Resp:      Temp:      TempSrc:      SpO2: (!) 80% (!) 87% 91% 94%  Weight:  Height:        Intake/Output Summary (Last 24 hours) at 06/22/2022 1056 Last data filed at 06/22/2022 0000 Gross per 24 hour  Intake 100 ml  Output 1600 ml  Net -1500 ml    Filed Weights   06/16/22 1453 06/19/22 0500 06/20/22 0429  Weight: 91.5 kg 101 kg 99.4 kg    Physical Examination: Body mass index is 31.44 kg/m.   General: Obese built, not in obvious distress, on nasal cannula oxygen at 5 L/min, elderly, weak and deconditioned, HENT:   No scleral pallor or icterus noted. Oral mucosa is moist.  Chest: Diminished breath sounds, coarse breath sounds noted, CVS: S1 &S2 heard. No murmur.  Regular rate and rhythm. Abdomen: Soft, nontender, nondistended.  Bowel sounds are heard.   Extremities: Left  below-knee amputation, right lower extremity edema Psych: Alert, awake and oriented, normal mood CNS: Moves all extremities. Skin: Warm and dry.  No rashes noted.  Data Reviewed:   CBC: Recent Labs  Lab 06/16/22 1510 06/17/22 0342 06/17/22 5366 06/18/22 0820 06/19/22 0202 06/20/22 0811 06/21/22 1120 06/22/22 0343  WBC 3.8* 5.1  --  8.4 9.2 4.8 4.8 9.2  NEUTROABS 2.5 3.0  --  5.8  --   --   --   --   HGB 12.0* 11.8*   < > 12.3* 13.6 10.1* 10.3* 11.5*  HCT 37.3* 36.8*   < > 37.0* 40.9 31.0* 30.8* 33.7*  MCV 107.5* 109.5*  --  102.8* 102.0* 102.0* 101.3* 101.5*  PLT 149* 163  --  175 186 PLATELET CLUMPS NOTED ON SMEAR, COUNT APPEARS DECREASED 104* 169   < > = values in this interval not displayed.     Basic Metabolic Panel: Recent Labs  Lab 06/17/22 0342 06/17/22 0937 06/18/22 0820 06/19/22 0202 06/20/22 0811 06/21/22 1120 06/21/22 1904 06/22/22 0343  NA 140   < > 137 137 137 138  --  139  K 4.2   < > 4.0 3.5 3.2* 3.4*  --  3.8  CL 112*  --  110 108 109 107  --  106  CO2 23  --  '22 23 25 25  '$ --  26  GLUCOSE 83  --  83 117* 78 106*  --  130*  BUN 13  --  '12 12 13 12  '$ --  12  CREATININE 0.56*  --  0.61 0.66 0.59* 0.62  --  0.64  CALCIUM 8.2*  --  8.1* 8.3* 8.0* 8.6*  --  8.6*  MG 1.8  --   --   --  1.9 1.8 2.2 2.1  PHOS  --   --   --   --   --   --  3.5 3.2   < > = values in this interval not displayed.     Liver Function Tests: Recent Labs  Lab 06/16/22 1510 06/17/22 0342 06/18/22 0820 06/22/22 0343  AST 51* 38 29 36  ALT 33 33 25 28  ALKPHOS 196* 176* 166* 195*  BILITOT 0.7 0.8 0.8 1.3*  PROT 5.4* 5.4* 5.3* 6.2*  ALBUMIN 1.8* 1.8* 1.8* 2.6*      Radiology Studies: DG Abd Portable 1V  Result Date: 06/21/2022 CLINICAL DATA:  Feeding tube placement EXAM: PORTABLE ABDOMEN - 1 VIEW COMPARISON:  None Available. FINDINGS: Tip of feeding tube is seen in the distal antrum of the stomach. There is mild dilation of small bowel loops. IMPRESSION: Tip of  feeding tube is seen in the distal antrum of the  stomach. Electronically Signed   By: Elmer Picker M.D.   On: 06/21/2022 16:15   DG CHEST PORT 1 VIEW  Result Date: 06/21/2022 CLINICAL DATA:  Right pleural effusion EXAM: PORTABLE CHEST 1 VIEW COMPARISON:  Chest 06/17/2022 FINDINGS: Extensive infiltrate throughout the right lung which has progressed considerably. Moderate right effusion has progressed. Left lower lobe infiltrate and effusion with mild improvement. IMPRESSION: Extensive infiltrate throughout the right lung with progression. Progressive right pleural effusion. Possible pneumonia Left lower lobe infiltrate and effusion with mild improvement. Electronically Signed   By: Franchot Gallo M.D.   On: 06/21/2022 15:51      LOS: 6 days    Flora Lipps, MD [facility] Available via Epic secure chat 7am-7pm After these hours, please refer to coverage provider listed on [email or web address] 06/22/2022, 10:56 AM

## 2022-06-22 NOTE — Progress Notes (Signed)
Rapid Response called per [facility].

## 2022-06-22 NOTE — Progress Notes (Signed)
Palliative:  Consult received and chart reviewed. Briefly spoke with patient's wife via phone - she has left the hospital for the day and asks to meet tomorrow. Goals of care discussion scheduled for 12:00 9/14.  Juel Burrow, DNP, AGNP-C Palliative Medicine Team Team Phone # [phone]  Pager # [phone]  NO CHARGE

## 2022-06-22 NOTE — Progress Notes (Signed)
   06/22/22 2210  Vitals  Temp 99.6 F (37.6 C)  Temp Source Axillary  MEWS Score  MEWS Temp 0  MEWS Systolic 0  MEWS Pulse 2  MEWS RR 0  MEWS LOC 0  MEWS Score 2  MEWS Score Color Yellow     Fever improving.

## 2022-06-22 NOTE — Progress Notes (Signed)
Patient's monitor alarming, sats in the 70's. On assessment pt was coughing, he was coached on deep breathing and sats came up to 91.   Continues with 15LHFNC.

## 2022-06-22 NOTE — Consult Note (Addendum)
Cardiology Consultation   Patient ID: Darrell Horne MRN: [id number]; DOB: 11-27-1945  Admit date: 06/16/2022 Date of Consult: 06/22/2022  PCP:  Darrell Hughs, Horne   [facility] Providers Cardiologist:  Darrell Horne   History of Present Illness:   Mr. Horne is a 76 yo male with history of DM, GERD, HTN, HLD, s/p left BKA and persistent atrial fibrillation on Eliquis who is admitted with altered mental status. He has been having increasing confusion in his SNF. He was recently admitted after an unintentional drug overdose. Today, he is in atrial fibrillation with HR in the 110-130 range. Chest xray with large right pleural effusion. He has undergone thoracentesis. Echo with LVEF=55-60%, mild to moderate MR.   He has no complaints today during my exam.    Past Medical History:  Diagnosis Date   Chest Horne    Diabetes mellitus    DM2 (diabetes mellitus, type 2) (HCC)    GERD (gastroesophageal reflux disease)    HLD (hyperlipidemia)    HTN (hypertension)    PAF (paroxysmal atrial fibrillation) ([place])    Palpitations     Past Surgical History:  Procedure Laterality Date   APPENDECTOMY     CHOLECYSTECTOMY     COLONOSCOPY     Dr. Michail Horne   COLONOSCOPY WITH PROPOFOL N/A 11/06/2018   Procedure: COLONOSCOPY WITH PROPOFOL;  Surgeon: Darrell Corner, MD;  Location: [facility];  Service: Endoscopy;  Laterality: N/A;   HERNIA REPAIR     IR THORACENTESIS ASP PLEURAL SPACE W/IMG GUIDE  06/17/2022   LEG AMPUTATION Left [id number]   Dr. Kendell Horne   POLYPECTOMY  11/06/2018   Procedure: POLYPECTOMY;  Surgeon: Darrell Corner, MD;  Location: [facility];  Service: Endoscopy;;   SHOULDER SURGERY  [id number]   Dr. Meridee Horne   TOTAL ANKLE ARTHROPLASTY  [id number]     Home Medications:  Prior to Admission medications   Medication Sig Start Date End Date Taking? Authorizing Provider  acetaminophen (TYLENOL) 500 MG tablet Take 1 tablet (500 mg total) by mouth every 6 (six) hours as  needed for moderate Horne. 05/27/22  Yes Horne, Darrell Horne  acetaminophen (TYLENOL) 650 MG CR tablet Take 650 mg by mouth See admin instructions. Give 1 tablet (650 mg) every 4 to 6 hours as needed for Horne or oral temp >100, rectal temp >102 **DO NOT GIVE MORE THAN 3,000 MG (Combined) IN 24 HOURS**   Yes [provider]  alum & mag hydroxide-simeth (MAALOX PLUS) 400-400-40 MG/5ML suspension Take 30 mLs by mouth 3 (three) times daily as needed for indigestion (Not to exceed 3 doses daily. Inform MD if used more than 3 days.). **Do not give at same time as antibiotics, Digoxin or Coumadin**   Yes [provider]  Amino Acids-Protein Hydrolys (PRO-STAT AWC) LIQD Take 30 mLs by mouth in the morning and at bedtime.   Yes [provider]  amiodarone (PACERONE) 200 MG tablet Take 1 tablet (200 mg total) by mouth 2 (two) times daily. 05/10/22  Yes Darrell Pain, MD  amitriptyline (ELAVIL) 50 MG tablet TAKE 1 TABLET BY MOUTH EVERYDAY AT BEDTIME Patient taking differently: Take 50 mg by mouth at bedtime. 02/11/21  Yes Horne, Darrell Horne  apixaban (ELIQUIS) 5 MG TABS tablet Take 1 tablet (5 mg total) by mouth 2 (two) times daily. 05/20/21  Yes Horne, Darrell Horne  atorvastatin (LIPITOR) 40 MG tablet Take 1 tablet (40 mg total) by mouth daily. 08/07/20  Yes Horne,  Darrell Horne  bisacodyl (DULCOLAX) 10 MG suppository Place 10 mg rectally as needed for moderate constipation (If no bowel movement within 72 hours or Milk of Magnesia, May give 1 suppository rectally (or 1 tablet) as needed for constipation and inform MD.).   Yes [provider]  bisacodyl (DULCOLAX) 5 MG EC tablet Take 5 mg by mouth as needed for moderate constipation (If no bowel movement within 72 hours or Milk of Magnesia, May give 1 tablet by mouth (or 1 suppository) as needed for constipation and inform MD).   Yes [provider]  folic acid (FOLVITE) 1 MG tablet TAKE 1 TABLET BY MOUTH EVERY DAY Patient  taking differently: Take 1 mg by mouth daily. 06/03/21  Yes Horne, Darrell Horne  gabapentin (NEURONTIN) 600 MG tablet Take 0.5 tablets (300 mg total) by mouth 3 (three) times daily. 06/10/22  Yes Horne, Darrell Leber, MD  ibuprofen (ADVIL) 200 MG tablet Take 200 mg by mouth every 6 (six) hours as needed for fever, mild Horne or moderate Horne (For Horne, oral temp >100, rectal temp >102 (or acetaminophen 650 mg by mouth every 4 hours)). **DO NOT GIVE MORE THAN 3,000 mg (Combined) IN 24 HOURS**   Yes [provider]  ketoconazole (NIZORAL) 2 % cream Apply 1 fingertip amount to each foot daily. 05/21/21  Yes Darrell Horne, DPM  lidocaine (LIDODERM) 5 % Place 1 patch onto the skin daily as needed (For back Horne). 06/10/22  Yes [provider]  loperamide (IMODIUM A-D) 2 MG tablet Take 2-4 mg by mouth as needed for diarrhea or loose stools (Give 2 tablets (4 mg) at onset of diarrhea, then 1 tablet (2 mg) after each stool. (Not to exceed 16 mg total) If diarrhea persists for 36 hours, notify MD.).   Yes [provider]  magnesium hydroxide (MILK OF MAGNESIA) 400 MG/5ML suspension Take 30 mLs by mouth 2 (two) times daily as needed for mild constipation or moderate constipation (*Use Miralax if Milk of Magnesia IS CONTRANDICATED (example: Dialysis resident)*).   Yes [provider]  meclizine (DRAMAMINE) 25 MG tablet Take 25 mg by mouth as needed for nausea (May repeat in 1 hour if ineffective. If nausea persists after second dose, call MD.).   Yes [provider]  metoprolol succinate (TOPROL-XL) 25 MG 24 hr tablet Take 1 tablet (25 mg total) by mouth daily. Take with or immediately following a meal. Patient taking differently: Take 12.5 mg by mouth daily. Take with or immediately following a meal. 06/10/22  Yes Horne, Darrell Leber, MD  nystatin cream (MYCOSTATIN) Apply 1 Application topically 2 (two) times daily as needed (For cutaneous or mucocutaneous candida rash of abdomen  folds, groin and under breasts for 21 days.). D/C order if rash is healed after 21 days. **Update MD if rash not resolved at that time**   Yes [provider]  omeprazole (PRILOSEC) 20 MG capsule TAKE 1 CAPSULE BY MOUTH EVERY DAY Patient taking differently: Take 20 mg by mouth daily. 01/28/21  Yes Horne, Darrell Horne  Phenylephrine-DM-GG (ROBITUSSIN CHILD COUGH/COLD CF) 2.02-11-49 MG/5ML LIQD Take 10 mLs by mouth every 4 (four) hours as needed (For cough *Notify MD if cough persists, congestion x3 days or accompanied by fever*).   Yes [provider]  polyethylene glycol (MIRALAX / GLYCOLAX) 17 g packet Take 17 g by mouth daily as needed for mild constipation or moderate constipation (*Use Miralax if Milk of Magnesia IS CONTRAINDICATED (Example: Dialysis resident)*).  Yes [provider]  promethazine (PHENERGAN) 25 MG tablet Take 25 mg by mouth every 4 (four) hours as needed for nausea or vomiting (May use suppository if unable to take by mouth. Notify MD if nausea persists after second dose.).   Yes [provider]  silver sulfADIAZINE (SILVADENE) 1 % cream Apply 1 Application topically daily. 06/15/22  Yes [provider]  topiramate (TOPAMAX) 50 MG tablet TAKE 1 AND 1/2 TABLETS BY MOUTH DAILY Patient taking differently: Take 75 mg by mouth daily. 02/26/21  Yes Horne, Darrell Horne  vitamin B-12 (CYANOCOBALAMIN) 1000 MCG tablet Take 1,000 mcg by mouth daily.   Yes [provider]  vitamin C (ASCORBIC ACID) 500 MG tablet Take 500 mg by mouth daily.    Yes [provider]    Inpatient Medications: Scheduled Meds:  amiodarone  200 mg Per Tube BID   amitriptyline  50 mg Per Tube QHS   [START ON 06/23/2022] atorvastatin  40 mg Per Tube Daily   feeding supplement (PROSource TF20)  60 mL Per Tube Daily   [START ON 8/52/7782] folic acid  1 mg Per Tube Daily   free water  50 mL Per Tube Q4H   metoprolol succinate  25 mg Oral Daily   mupirocin  cream   Topical Daily   mouth rinse  15 mL Mouth Rinse 4 times per day   [START ON 06/23/2022] polyethylene glycol  17 g Per Tube Daily   [START ON 06/23/2022] topiramate  75 mg Per Tube Daily   Continuous Infusions:  azithromycin 500 mg (06/22/22 1337)   cefTRIAXone (ROCEPHIN)  IV 1 g (06/22/22 1231)   feeding supplement (OSMOLITE 1.5 CAL) 55 mL/hr at 06/22/22 1200   PRN Meds: acetaminophen **OR** acetaminophen, lidocaine (PF), mouth rinse  Allergies:   No Known Allergies  Social History:   Social History   Socioeconomic History   Marital status: Married    Spouse name: Not on file   Number of children: 4   Years of education: Not on file   Highest education level: Not on file  Occupational History   Occupation: retired Administrator  Tobacco Use   Smoking status: Former    Years: 12.00    Types: Cigarettes    Quit date: 10/10/1981    Years since quitting: 40.7   Smokeless tobacco: Never  Vaping Use   Vaping Use: Never used  Substance and Sexual Activity   Alcohol use: Yes    Comment: has glass of wine once a week but states rarely    Drug use: No   Sexual activity: Never  Other Topics Concern   Not on file  Social History Narrative   Diet:   Do you drink/eat things with caffeine?  Yes   Marital status:  Divorced.  What year were you married?  2001   Do you live in a house, assisted living, condo, apartment, trailer, etc.?  Apartment   Is it one or more stories?  1 story   How many persons live in your home?  1   Do you have any pets in your home?  No   Current or past profession:  Truck driver   Do you exercise?  Some  Type and how often:  Walking   Social Determinants of Health   Financial Resource Strain: Low Risk  (01/24/2018)   Overall Financial Resource Strain (CARDIA)    Difficulty of Paying Living Expenses: Not hard at all  Food Insecurity: No Food  Insecurity (01/24/2018)   Hunger Vital Sign    Worried About Running Out of Food in the Last Year: Never  true    Ran Out of Food in the Last Year: Never true  Transportation Needs: No Transportation Needs (01/24/2018)   PRAPARE - Hydrologist (Medical): No    Lack of Transportation (Non-Medical): No  Physical Activity: Inactive (01/24/2018)   Exercise Vital Sign    Days of Exercise per Week: 0 days    Minutes of Exercise per Session: 0 min  Stress: Stress Concern Present (01/24/2018)   [place]    Feeling of Stress : To some extent  Social Connections: Moderately Isolated (01/24/2018)   Social Connection and Isolation Panel [NHANES]    Frequency of Communication with Friends and Family: Once a week    Frequency of Social Gatherings with Friends and Family: Once a week    Attends Religious Services: More than 4 times per year    Active Member of [employer] or Organizations: No    Attends Archivist Meetings: Never    Marital Status: Divorced  Human resources officer Violence: Not At Risk (01/24/2018)   Humiliation, Afraid, Rape, and Kick questionnaire    Fear of Current or Ex-Partner: No    Emotionally Abused: No    Physically Abused: No    Sexually Abused: No    Family History:   Family History  Problem Relation Age of Onset   Heart disease Mother        Living   Stroke Father 53       Deceased   Diabetes Sister    Cancer Sister      ROS:  Please see the history of present illness.  All other ROS reviewed and negative.     Physical Exam/Data:   Vitals:   06/22/22 1005 06/22/22 1010 06/22/22 1012 06/22/22 1202  BP:    109/66  Pulse:    (!) 119  Resp:    16  Temp:    98.7 F (37.1 C)  TempSrc:    Oral  SpO2: (!) 87% 91% 94% 92%  Weight:      Height:        Intake/Output Summary (Last 24 hours) at 06/22/2022 1450 Last data filed at 06/22/2022 1337 Gross per 24 hour  Intake 100 ml  Output 1550 ml  Net -1450 ml      06/20/2022    4:29 AM 06/19/2022    5:00 AM 06/16/2022     2:53 PM  Last 3 Weights  Weight (lbs) 219 lb 2.2 oz 222 lb 10.6 oz 201 lb 11.5 oz  Weight (kg) 99.4 kg 101 kg 91.5 kg     Body mass index is 31.44 kg/m.  General:  Well nourished, oriented to person only during my exam HEENT: normal Neck: no JVD Cardiac:  normal S1, S2; RRR; soft systolic murmur Lungs:  clear to auscultation bilaterally Abd: soft, nontender, no hepatomegaly  Ext: Trace right lower ext edema. Left BKA Neuro:  CNs 2-12 intact, no focal abnormalities noted Psych:  Normal affect, confused  EKG:  The EKG was personally reviewed and demonstrates:  atrial fib (EKG from 06/16/22) Telemetry:  Telemetry was personally reviewed and demonstrates:  atrial fib, rates 110-130 bpm  Relevant CV Studies:   Laboratory Data:  High Sensitivity Troponin:  No results for input(s): "TROPONINIHS" in the last 720 hours.   Chemistry Recent Labs  Lab 06/20/22  [id number] 06/21/22 1120 06/21/22 1904 06/22/22 0343  NA 137 138  --  139  K 3.2* 3.4*  --  3.8  CL 109 107  --  106  CO2 25 25  --  26  GLUCOSE 78 106*  --  130*  BUN 13 12  --  12  CREATININE 0.59* 0.62  --  0.64  CALCIUM 8.0* 8.6*  --  8.6*  MG 1.9 1.8 2.2 2.1  GFRNONAA >60 >60  --  >60  ANIONGAP 3* 6  --  7    Recent Labs  Lab 06/17/22 0342 06/18/22 0820 06/22/22 0343  PROT 5.4* 5.3* 6.2*  ALBUMIN 1.8* 1.8* 2.6*  AST 38 29 36  ALT 33 25 28  ALKPHOS 176* 166* 195*  BILITOT 0.8 0.8 1.3*   Lipids No results for input(s): "CHOL", "TRIG", "HDL", "LABVLDL", "LDLCALC", "CHOLHDL" in the last 168 hours.  Hematology Recent Labs  Lab 06/20/22 0811 06/21/22 1120 06/22/22 0343  WBC 4.8 4.8 9.2  RBC 3.04* 3.04* 3.32*  HGB 10.1* 10.3* 11.5*  HCT 31.0* 30.8* 33.7*  MCV 102.0* 101.3* 101.5*  MCH 33.2 33.9 34.6*  MCHC 32.6 33.4 34.1  RDW 13.6 13.7 13.8  PLT PLATELET CLUMPS NOTED ON SMEAR, COUNT APPEARS DECREASED 104* 169   Thyroid  Recent Labs  Lab 06/17/22 0342  TSH 4.698*    BNP Recent Labs  Lab 06/16/22 1510  06/19/22 0202  BNP 341.3* 298.9*    DDimer No results for input(s): "DDIMER" in the last 168 hours.   Radiology/Studies:  DG CHEST PORT 1 VIEW  Result Date: 06/22/2022 CLINICAL DATA:  Dyspnea. EXAM: PORTABLE CHEST 1 VIEW COMPARISON:  Chest x-ray from yesterday. FINDINGS: New feeding tube entering the stomach with the tip below the field of view. Stable cardiomediastinal silhouette. Diffuse bilateral airspace disease and interstitial thickening remains worse on the right, but has progressed on the left compared to prior. Unchanged moderate right and small left pleural effusions. No pneumothorax. No acute osseous abnormality. IMPRESSION: 1. Worsening pulmonary edema and/or pneumonia. 2. Unchanged moderate right and small left pleural effusions. 3. New feeding tube entering the stomach with the tip below the field of view. Electronically Signed   By: Titus Dubin M.D.   On: 06/22/2022 11:42   DG Abd Portable 1V  Result Date: 06/21/2022 CLINICAL DATA:  Feeding tube placement EXAM: PORTABLE ABDOMEN - 1 VIEW COMPARISON:  None Available. FINDINGS: Tip of feeding tube is seen in the distal antrum of the stomach. There is mild dilation of small bowel loops. IMPRESSION: Tip of feeding tube is seen in the distal antrum of the stomach. Electronically Signed   By: Elmer Picker M.D.   On: 06/21/2022 16:15   DG CHEST PORT 1 VIEW  Result Date: 06/21/2022 CLINICAL DATA:  Right pleural effusion EXAM: PORTABLE CHEST 1 VIEW COMPARISON:  Chest 06/17/2022 FINDINGS: Extensive infiltrate throughout the right lung which has progressed considerably. Moderate right effusion has progressed. Left lower lobe infiltrate and effusion with mild improvement. IMPRESSION: Extensive infiltrate throughout the right lung with progression. Progressive right pleural effusion. Possible pneumonia Left lower lobe infiltrate and effusion with mild improvement. Electronically Signed   By: Franchot Gallo M.D.   On: 06/21/2022 15:51    ECHOCARDIOGRAM COMPLETE  Result Date: 06/20/2022    ECHOCARDIOGRAM REPORT   Patient Name:   TALMAGE TEASTER Date of Exam: 06/20/2022 Medical Rec #:  [id number]     Height:       70.0 in Accession #:  [id number]    Weight:       219.1 lb Date of Birth:  1946-04-20     BSA:          2.170 m Patient Age:    46 years      BP:           87/53 mmHg Patient Gender: M             HR:           64 bpm. Exam Location:  Inpatient Procedure: 2D Echo, Cardiac Doppler and Color Doppler Indications:    Pleural effusion  History:        Patient has prior history of Echocardiogram examinations, most                 recent 04/19/2016. Risk Factors:Hypertension, Diabetes and                 Dyslipidemia.  Sonographer:    Jefferey Pica Referring Phys: [id number] [place]  1. Left ventricular ejection fraction, by estimation, is 55 to 60%. The left ventricle has normal function. The left ventricle has no regional wall motion abnormalities. Left ventricular diastolic parameters are indeterminate.  2. Right ventricular systolic function is low normal. The right ventricular size is normal. There is mildly elevated pulmonary artery systolic pressure. The estimated right ventricular systolic pressure is 82.4 mmHg.  3. Large pleural effusion in the left lateral region.  4. The mitral valve is grossly normal. Mild to moderate mitral valve regurgitation.  5. The aortic valve was not well visualized. Aortic valve regurgitation is trivial. No aortic stenosis is present. Comparison(s): MR worse from prior report. FINDINGS  Left Ventricle: Left ventricular ejection fraction, by estimation, is 55 to 60%. The left ventricle has normal function. The left ventricle has no regional wall motion abnormalities. The left ventricular internal cavity size was small. There is no left ventricular hypertrophy. Left ventricular diastolic parameters are indeterminate. Right Ventricle: The right ventricular size is normal. No increase in right  ventricular wall thickness. Right ventricular systolic function is low normal. There is mildly elevated pulmonary artery systolic pressure. The tricuspid regurgitant velocity is 2.34 m/s, and with an assumed right atrial pressure of 15 mmHg, the estimated right ventricular systolic pressure is 23.5 mmHg. Left Atrium: Left atrial size was normal in size. Right Atrium: Right atrial size was normal in size. Pericardium: There is no evidence of pericardial effusion. Mitral Valve: The mitral valve is grossly normal. Mild to moderate mitral annular calcification. Mild to moderate mitral valve regurgitation. Tricuspid Valve: The tricuspid valve is normal in structure. Tricuspid valve regurgitation is not demonstrated. No evidence of tricuspid stenosis. Aortic Valve: The aortic valve was not well visualized. Aortic valve regurgitation is trivial. No aortic stenosis is present. Aortic valve peak gradient measures 6.5 mmHg. Pulmonic Valve: The pulmonic valve was not well visualized. Pulmonic valve regurgitation is not visualized. Aorta: The aortic root is normal in size and structure and the ascending aorta was not well visualized. IAS/Shunts: No atrial level shunt detected by color flow Doppler. Additional Comments: There is a large pleural effusion in the left lateral region.  LEFT VENTRICLE PLAX 2D LVIDd:         3.60 cm LVIDs:         2.80 cm LV PW:         0.90 cm LV IVS:        1.00 cm LVOT diam:     1.80 cm  LV SV:         45 LV SV Index:   21 LVOT Area:     2.54 cm  RIGHT VENTRICLE          IVC RV Basal diam:  3.10 cm  IVC diam: 2.10 cm LEFT ATRIUM             Index        RIGHT ATRIUM           Index LA diam:        3.60 cm 1.66 cm/m   RA Area:     14.00 cm LA Vol (A2C):   45.3 ml 20.88 ml/m  RA Volume:   34.70 ml  15.99 ml/m LA Vol (A4C):   43.6 ml 20.09 ml/m LA Biplane Vol: 46.7 ml 21.52 ml/m  AORTIC VALVE                 PULMONIC VALVE AV Area (Vmax): 1.54 cm     PV Vmax:       0.63 m/s AV Vmax:         127.00 cm/s  PV Peak grad:  1.6 mmHg AV Peak Grad:   6.5 mmHg LVOT Vmax:      76.70 cm/s LVOT Vmean:     51.800 cm/s LVOT VTI:       0.176 m  AORTA Ao Root diam: 3.20 cm MR Peak grad: 48.2 mmHg   TRICUSPID VALVE MR Vmax:      347.00 cm/s TR Peak grad:   21.9 mmHg                           TR Vmax:        234.00 cm/s                            SHUNTS                           Systemic VTI:  0.18 m                           Systemic Diam: 1.80 cm Rudean Haskell MD Electronically signed by Rudean Haskell MD Signature Date/Time: 06/20/2022/11:13:16 AM    Final      Assessment and Plan:   Persistent atrial fib: HR is elevated in the setting of acute illness. He is on Toprol 25 mg daily and amiodarone 200 mg po BID at home. Given elevated heart rates, will increase Toprol to 25 mg po BID. Continue amiodarone. I suspect that his heart rate will come down as his acute illness resolves. He is not currently anti-coagulated. His Eliquis is on hold for repeat thoracentesis. Resume Eliquis when safe post procedure. We will follow this week.    For questions or updates, please contact [place] Please consult [email or web address] for contact info under    Signed, Lauree Chandler, MD  06/22/2022 2:50 PM

## 2022-06-23 ENCOUNTER — Inpatient Hospital Stay (HOSPITAL_COMMUNITY): Payer: Medicare Other

## 2022-06-23 DIAGNOSIS — Z515 Encounter for palliative care: Secondary | ICD-10-CM

## 2022-06-23 DIAGNOSIS — Z7189 Other specified counseling: Secondary | ICD-10-CM

## 2022-06-23 DIAGNOSIS — J9601 Acute respiratory failure with hypoxia: Secondary | ICD-10-CM | POA: Diagnosis not present

## 2022-06-23 DIAGNOSIS — I480 Paroxysmal atrial fibrillation: Secondary | ICD-10-CM | POA: Diagnosis not present

## 2022-06-23 DIAGNOSIS — G9340 Encephalopathy, unspecified: Secondary | ICD-10-CM | POA: Diagnosis not present

## 2022-06-23 LAB — BASIC METABOLIC PANEL
Anion gap: 6 (ref 5–15)
Anion gap: 4 — ABNORMAL LOW (ref 5–15)
Anion gap: 8 (ref 5–15)
BUN: 18 mg/dL (ref 8–23)
BUN: 18 mg/dL (ref 8–23)
BUN: 17 mg/dL (ref 8–23)
CO2: 27 mmol/L (ref 22–32)
CO2: 28 mmol/L (ref 22–32)
CO2: 29 mmol/L (ref 22–32)
Calcium: 8.9 mg/dL (ref 8.9–10.3)
Calcium: 8.5 mg/dL — ABNORMAL LOW (ref 8.9–10.3)
Calcium: 8.9 mg/dL (ref 8.9–10.3)
Chloride: 107 mmol/L (ref 98–111)
Chloride: 109 mmol/L (ref 98–111)
Chloride: 107 mmol/L (ref 98–111)
Creatinine, Ser: 0.65 mg/dL (ref 0.61–1.24)
Creatinine, Ser: 0.74 mg/dL (ref 0.61–1.24)
Creatinine, Ser: 0.7 mg/dL (ref 0.61–1.24)
GFR, Estimated: >60 mL/min (ref >60)
GFR, Estimated: >60 mL/min (ref >60)
GFR, Estimated: >60 mL/min (ref >60)
Glucose, Bld: 139 mg/dL — ABNORMAL HIGH (ref 70–99)
Glucose, Bld: 124 mg/dL — ABNORMAL HIGH (ref 70–99)
Glucose, Bld: 126 mg/dL — ABNORMAL HIGH (ref 70–99)
Potassium: 3.7 mmol/L (ref 3.5–5.1)
Potassium: 3.7 mmol/L (ref 3.5–5.1)
Potassium: 3.1 mmol/L — ABNORMAL LOW (ref 3.5–5.1)
Sodium: 140 mmol/L (ref 135–145)
Sodium: 141 mmol/L (ref 135–145)
Sodium: 144 mmol/L (ref 135–145)

## 2022-06-23 LAB — GLUCOSE, CAPILLARY
Glucose-Capillary: 103 mg/dL — ABNORMAL HIGH (ref 70–99)
Glucose-Capillary: 126 mg/dL — ABNORMAL HIGH (ref 70–99)
Glucose-Capillary: 143 mg/dL — ABNORMAL HIGH (ref 70–99)
Glucose-Capillary: 88 mg/dL (ref 70–99)
Glucose-Capillary: 90 mg/dL (ref 70–99)
Glucose-Capillary: 96 mg/dL (ref 70–99)
Glucose-Capillary: 98 mg/dL (ref 70–99)

## 2022-06-23 LAB — CBC
HCT: 33 % — ABNORMAL LOW (ref 39.0–52.0)
Hemoglobin: 10.8 g/dL — ABNORMAL LOW (ref 13.0–17.0)
MCH: 33.8 pg (ref 26.0–34.0)
MCHC: 32.7 g/dL (ref 30.0–36.0)
MCV: 103.1 fL — ABNORMAL HIGH (ref 80.0–100.0)
Platelets: 125 10*3/uL — ABNORMAL LOW (ref 150–400)
RBC: 3.2 MIL/uL — ABNORMAL LOW (ref 4.22–5.81)
RDW: 14 % (ref 11.5–15.5)
WBC: 10.2 10*3/uL (ref 4.0–10.5)
nRBC: 0 % (ref 0.0–0.2)

## 2022-06-23 LAB — MAGNESIUM: Magnesium: 2 mg/dL (ref 1.7–2.4)

## 2022-06-23 LAB — MRSA NEXT GEN BY PCR, NASAL: MRSA by PCR Next Gen: NOT DETECTED

## 2022-06-23 LAB — PHOSPHORUS: Phosphorus: 2.7 mg/dL (ref 2.5–4.6)

## 2022-06-23 MED ORDER — METOPROLOL TARTRATE 25 MG PO TABS
25.0000 mg | ORAL_TABLET | Freq: Two times a day (BID) | ORAL | Status: DC
  Administered 2022-06-25: 25 mg
  Filled 2022-06-23 (×3): qty 1

## 2022-06-23 MED ORDER — FUROSEMIDE 10 MG/ML IJ SOLN
80.0000 mg | Freq: Three times a day (TID) | INTRAMUSCULAR | Status: DC
  Administered 2022-06-23 – 2022-06-24 (×3): 80 mg via INTRAVENOUS
  Filled 2022-06-23 (×3): qty 8

## 2022-06-23 MED ORDER — ALBUMIN HUMAN 25 % IV SOLN
25.0000 g | Freq: Four times a day (QID) | INTRAVENOUS | Status: AC
  Administered 2022-06-23 – 2022-06-24 (×4): 25 g via INTRAVENOUS
  Filled 2022-06-23 (×4): qty 100

## 2022-06-23 MED ORDER — SODIUM CHLORIDE 0.9 % IV SOLN
1.0000 g | Freq: Two times a day (BID) | INTRAVENOUS | Status: DC
  Administered 2022-06-23: 1 g via INTRAVENOUS
  Filled 2022-06-23 (×2): qty 10

## 2022-06-23 MED ORDER — FUROSEMIDE 10 MG/ML IJ SOLN
80.0000 mg | Freq: Once | INTRAMUSCULAR | Status: AC
  Administered 2022-06-23: 80 mg via INTRAVENOUS
  Filled 2022-06-23: qty 8

## 2022-06-23 MED ORDER — POTASSIUM CHLORIDE 20 MEQ PO PACK
40.0000 meq | PACK | ORAL | Status: AC
  Administered 2022-06-23 (×2): 40 meq
  Filled 2022-06-23 (×2): qty 2

## 2022-06-23 MED ORDER — METOLAZONE 2.5 MG PO TABS
5.0000 mg | ORAL_TABLET | Freq: Once | ORAL | Status: AC
  Administered 2022-06-23: 5 mg
  Filled 2022-06-23: qty 2

## 2022-06-23 MED ORDER — SODIUM CHLORIDE 0.9 % IV SOLN
2.0000 g | Freq: Three times a day (TID) | INTRAVENOUS | Status: DC
  Administered 2022-06-23 – 2022-06-25 (×6): 2 g via INTRAVENOUS
  Filled 2022-06-23 (×6): qty 12.5

## 2022-06-23 MED ORDER — LINEZOLID 600 MG PO TABS
600.0000 mg | ORAL_TABLET | Freq: Two times a day (BID) | ORAL | Status: DC
  Administered 2022-06-23 (×2): 600 mg
  Filled 2022-06-23 (×3): qty 1

## 2022-06-23 MED ORDER — POTASSIUM CHLORIDE 20 MEQ PO PACK
40.0000 meq | PACK | Freq: Once | ORAL | Status: AC
  Administered 2022-06-23: 40 meq
  Filled 2022-06-23: qty 2

## 2022-06-23 MED ORDER — NOREPINEPHRINE 4 MG/250ML-% IV SOLN
INTRAVENOUS | Status: AC
  Filled 2022-06-23: qty 250

## 2022-06-23 MED ORDER — SODIUM CHLORIDE 0.9 % IV SOLN
250.0000 mL | INTRAVENOUS | Status: DC
  Administered 2022-06-23 – 2022-07-07 (×5): 250 mL via INTRAVENOUS

## 2022-06-23 MED ORDER — CHLORHEXIDINE GLUCONATE CLOTH 2 % EX PADS
6.0000 | MEDICATED_PAD | Freq: Every day | CUTANEOUS | Status: DC
  Administered 2022-06-23 – 2022-07-06 (×16): 6 via TOPICAL

## 2022-06-23 MED ORDER — FENTANYL CITRATE PF 50 MCG/ML IJ SOSY
25.0000 ug | PREFILLED_SYRINGE | INTRAMUSCULAR | Status: DC | PRN
  Administered 2022-06-23 – 2022-07-11 (×10): 25 ug via INTRAVENOUS
  Filled 2022-06-23 (×12): qty 1

## 2022-06-23 MED ORDER — PHENYLEPHRINE HCL-NACL 20-0.9 MG/250ML-% IV SOLN
25.0000 ug/min | INTRAVENOUS | Status: DC
  Administered 2022-06-23: 25 ug/min via INTRAVENOUS
  Filled 2022-06-23: qty 250

## 2022-06-23 MED ORDER — APIXABAN 5 MG PO TABS
5.0000 mg | ORAL_TABLET | Freq: Two times a day (BID) | ORAL | Status: DC
  Administered 2022-06-23 – 2022-07-03 (×20): 5 mg
  Filled 2022-06-23 (×20): qty 1

## 2022-06-23 NOTE — Progress Notes (Signed)
NAME:  Darrell Horne, MRN:  [id number], DOB:  05/24/1946, LOS: 7 ADMISSION DATE:  06/16/2022, CONSULTATION DATE:  06/22/2022 REFERRING MD:  Dr. Louanne Belton, CHIEF COMPLAINT:  Acute Hypoxic Respiratory Failure, Pleural Effusion    History of Present Illness:  Darrell Horne is a 76 year old male with a past medical history of paroxysmal A-fib on Eliquis, left BKA, HTN, HLD, and alcoholic cirrhosis who presented to the hospital with altered mental status and was admitted on 06/16/2022.  He was found to have a large right-sided pleural effusion and his intermittently increased since August 26.  Thoracentesis on 06/17/2022 81 with 2 L of yellow fluid was found to be transudative pleural effusion.  Initially he did well after the procedure but over the past couple days has had increased oxygen requirements and episodes of hypotension.  Hypotension was treated with albumin and midodrine.  PCCM was consulted for further evaluation and management of his right-sided pleural effusion and acute hypoxic respiratory failure.  Pertinent  Medical History   Past Medical History:  Diagnosis Date   Chest pain    Diabetes mellitus    DM2 (diabetes mellitus, type 2) (HCC)    GERD (gastroesophageal reflux disease)    HLD (hyperlipidemia)    HTN (hypertension)    PAF (paroxysmal atrial fibrillation) ([place])    Palpitations      Significant Hospital Events: Including procedures, antibiotic start and stop dates in addition to other pertinent events   9/8 thoracentesis with return of 1.2 L of transudative fluid 9/12 increasing oxygen requirements with up to 6 L high flow nasal cannula and episodes of hypotension with systolic of 89.  Albumin, midodrine, and tube feeds started. 9/13 PCCM consulted, Eliquis held for planned thoracentesis tomorrow. CXR with stable pulm edema and pleural effusions 9/14 increased O2 requirements, stable on BiPAP now. Bilateral chest tubes placed.   Interim History / Subjective:  Now on BiPAP and  stable dyspnea. No new or worsening complaints. No nausea. Still edematous.   Objective   Blood pressure 113/73, pulse 95, temperature 98.4 F (36.9 C), temperature source Axillary, resp. rate (!) 21, height '5\' 10"'$  (1.778 m), weight 91.3 kg, SpO2 98 %.    FiO2 (%):  [100 %] 100 %   Intake/Output Summary (Last 24 hours) at 06/23/2022 0827 Last data filed at 06/23/2022 0400 Gross per 24 hour  Intake 1518.75 ml  Output 1400 ml  Net 118.75 ml   Filed Weights   06/20/22 0429 06/23/22 0352 06/23/22 0536  Weight: 99.4 kg 93.9 kg 91.3 kg    Examination: General: Ill-appearing elderly gentleman sitting in bed in NAD on BiPAP HENT: [place]/AT, PERRL Lungs: Diminished lung sounds right greater than left Cardiovascular: Irregularly irregular rhythm with a regular rate Abdomen: Soft and nontender Extremities: moderate edema in the bilateral upper and lower extremities, stable left BKA Neuro: Alert and oriented x3 Skin: Venous stasis dermatitis over [facility] Problem list     Assessment & Plan:  Right-sided pleural effusion Acute hypoxic respiratory failure due to pulmonary edema Anasarca Poor protoplasm RV dysfunction Cirrhosis Despite 1 week of inpatient hospital care he has worsening volume status and respiratory status.  Likely has not achieved near adequate diuresis.  His anasarca is exacerbated by hypoalbuminemia and severe protein calorie malnutrition as well as protal hypertension. He has signs of RV dysfunction on TTE.  Possibly developing hospital-acquired pneumonia vs aspiration in setting of encephalopathy with a fever of 100.5 overnight. This morning he showed worsening respiratory status requiring  BiPAP.  -- Lasix 80 mg IV q8h and metolazone 5 mg daily -- Continue BiPAP -- Bilateral chest tubes placed to avoid intubation  -- May need central line placed as well today -- Broaden antibiotics to cefepime and linezolid, attempt to obtain lower respiratory  culture  Best Practice (right click and "Reselect all SmartList Selections" daily)   Diet/type: NPO DVT prophylaxis: DOAC will resume today GI prophylaxis: N/A Lines: N/A Foley:  N/A Code Status:  full code Last date of multidisciplinary goals of care discussion [9/14]  Labs   CBC: Recent Labs  Lab 06/16/22 1510 06/17/22 0342 06/17/22 0937 06/18/22 0820 06/19/22 0202 06/20/22 0811 06/21/22 1120 06/22/22 0343 06/23/22 0509  WBC 3.8* 5.1  --  8.4 9.2 4.8 4.8 9.2 10.2  NEUTROABS 2.5 3.0  --  5.8  --   --   --   --   --   HGB 12.0* 11.8*   < > 12.3* 13.6 10.1* 10.3* 11.5* 10.8*  HCT 37.3* 36.8*   < > 37.0* 40.9 31.0* 30.8* 33.7* 33.0*  MCV 107.5* 109.5*  --  102.8* 102.0* 102.0* 101.3* 101.5* 103.1*  PLT 149* 163  --  175 186 PLATELET CLUMPS NOTED ON SMEAR, COUNT APPEARS DECREASED 104* 169 125*   < > = values in this interval not displayed.    Basic Metabolic Panel: Recent Labs  Lab 06/19/22 0202 06/20/22 0811 06/21/22 1120 06/21/22 1904 06/22/22 0343 06/22/22 1659 06/23/22 0509  NA 137 137 138  --  139  --  140  K 3.5 3.2* 3.4*  --  3.8  --  3.7  CL 108 109 107  --  106  --  107  CO2 '23 25 25  '$ --  26  --  27  GLUCOSE 117* 78 106*  --  130*  --  139*  BUN '12 13 12  '$ --  12  --  18  CREATININE 0.66 0.59* 0.62  --  0.64  --  0.65  CALCIUM 8.3* 8.0* 8.6*  --  8.6*  --  8.9  MG  --  1.9 1.8 2.2 2.1 2.0 2.0  PHOS  --   --   --  3.5 3.2 3.0 2.7   GFR: Estimated Creatinine Clearance: 89.2 mL/min (by C-G formula based on SCr of 0.65 mg/dL). Recent Labs  Lab 06/16/22 1510 06/17/22 0342 06/20/22 0811 06/21/22 1120 06/22/22 0343 06/23/22 0509  PROCALCITON <0.10  --   --   --   --   --   WBC 3.8*   < > 4.8 4.8 9.2 10.2   < > = values in this interval not displayed.    Liver Function Tests: Recent Labs  Lab 06/16/22 1510 06/17/22 0342 06/18/22 0820 06/22/22 0343  AST 51* 38 29 36  ALT 33 33 25 28  ALKPHOS 196* 176* 166* 195*  BILITOT 0.7 0.8 0.8 1.3*   PROT 5.4* 5.4* 5.3* 6.2*  ALBUMIN 1.8* 1.8* 1.8* 2.6*   No results for input(s): "LIPASE", "AMYLASE" in the last 168 hours. Recent Labs  Lab 06/16/22 1510  AMMONIA 39*    ABG    Component Value Date/Time   PHART 7.4 06/22/2022 2115   PCO2ART 46 06/22/2022 2115   PO2ART 73 (L) 06/22/2022 2115   HCO3 27.9 06/22/2022 2115   TCO2 29 06/17/2022 0937   O2SAT 96.8 06/22/2022 2115     Coagulation Profile: No results for input(s): "INR", "PROTIME" in the last 168 hours.  Cardiac Enzymes: Recent Labs  Lab 06/17/22 0342  CKTOTAL 35*    HbA1C: Hgb A1c MFr Bld  Date/Time Value Ref Range Status  04/19/2022 10:39 AM 4.8 <5.7 % of total Hgb Final    Comment:    For the purpose of screening for the presence of diabetes: . <5.7%       Consistent with the absence of diabetes 5.7-6.4%    Consistent with increased risk for diabetes             (prediabetes) > or =6.5%  Consistent with diabetes . This assay result is consistent with a decreased risk of diabetes. . Currently, no consensus exists regarding use of hemoglobin A1c for diagnosis of diabetes in children. . According to American Diabetes Association (ADA) guidelines, hemoglobin A1c <7.0% represents optimal control in non-pregnant diabetic patients. Different metrics may apply to specific patient populations.  Standards of Medical Care in Diabetes(ADA). Marland Kitchen   03/24/2021 10:52 AM 5.6 <5.7 % of total Hgb Final    Comment:    For the purpose of screening for the presence of diabetes: . <5.7%       Consistent with the absence of diabetes 5.7-6.4%    Consistent with increased risk for diabetes             (prediabetes) > or =6.5%  Consistent with diabetes . This assay result is consistent with a decreased risk of diabetes. . Currently, no consensus exists regarding use of hemoglobin A1c for diagnosis of diabetes in children. . According to American Diabetes Association (ADA) guidelines, hemoglobin A1c <7.0%  represents optimal control in non-pregnant diabetic patients. Different metrics may apply to specific patient populations.  Standards of Medical Care in Diabetes(ADA). .     CBG: Recent Labs  Lab 06/22/22 2034 06/22/22 2328 06/23/22 0351 06/23/22 0529 06/23/22 0725  GLUCAP 166* 142* 143* 126* 103*    Review of Systems:   Dyspnea. No pain.  Past Medical History:  He,  has a past medical history of Chest pain, Diabetes mellitus, DM2 (diabetes mellitus, type 2) ([place]), GERD (gastroesophageal reflux disease), HLD (hyperlipidemia), HTN (hypertension), PAF (paroxysmal atrial fibrillation) ([place]), and Palpitations.   Surgical History:   Past Surgical History:  Procedure Laterality Date   APPENDECTOMY     CHOLECYSTECTOMY     COLONOSCOPY     Dr. Michail Sermon   COLONOSCOPY WITH PROPOFOL N/A 11/06/2018   Procedure: COLONOSCOPY WITH PROPOFOL;  Surgeon: Wilford Corner, MD;  Location: [facility];  Service: Endoscopy;  Laterality: N/A;   HERNIA REPAIR     IR THORACENTESIS ASP PLEURAL SPACE W/IMG GUIDE  06/17/2022   LEG AMPUTATION Left [id number]   Dr. Kendell Bane   POLYPECTOMY  11/06/2018   Procedure: POLYPECTOMY;  Surgeon: Wilford Corner, MD;  Location: [facility];  Service: Endoscopy;;   SHOULDER SURGERY  [id number]   Dr. Meridee Score   TOTAL ANKLE ARTHROPLASTY  [id number]     Social History:   reports that he quit smoking about 40 years ago. His smoking use included cigarettes. He has never used smokeless tobacco. He reports current alcohol use. He reports that he does not use drugs.   Family History:  His family history includes Cancer in his sister; Diabetes in his sister; Heart disease in his mother; Stroke (age of onset: 39) in his father.   Allergies No Known Allergies   Home Medications  Prior to Admission medications   Medication Sig Start Date End Date Taking? Authorizing Provider  acetaminophen (TYLENOL) 500 MG tablet Take 1 tablet (500 mg  total) by mouth every 6  (six) hours as needed for moderate pain. 05/27/22  Yes Ngetich, Dinah C, NP  acetaminophen (TYLENOL) 650 MG CR tablet Take 650 mg by mouth See admin instructions. Give 1 tablet (650 mg) every 4 to 6 hours as needed for pain or oral temp >100, rectal temp >102 **DO NOT GIVE MORE THAN 3,000 MG (Combined) IN 24 HOURS**   Yes [provider]  alum & mag hydroxide-simeth (MAALOX PLUS) 400-400-40 MG/5ML suspension Take 30 mLs by mouth 3 (three) times daily as needed for indigestion (Not to exceed 3 doses daily. Inform MD if used more than 3 days.). **Do not give at same time as antibiotics, Digoxin or Coumadin**   Yes [provider]  Amino Acids-Protein Hydrolys (PRO-STAT AWC) LIQD Take 30 mLs by mouth in the morning and at bedtime.   Yes [provider]  amiodarone (PACERONE) 200 MG tablet Take 1 tablet (200 mg total) by mouth 2 (two) times daily. 05/10/22  Yes Jerline Pain, MD  amitriptyline (ELAVIL) 50 MG tablet TAKE 1 TABLET BY MOUTH EVERYDAY AT BEDTIME Patient taking differently: Take 50 mg by mouth at bedtime. 02/11/21  Yes Ngetich, Dinah C, NP  apixaban (ELIQUIS) 5 MG TABS tablet Take 1 tablet (5 mg total) by mouth 2 (two) times daily. 05/20/21  Yes Ngetich, Dinah C, NP  atorvastatin (LIPITOR) 40 MG tablet Take 1 tablet (40 mg total) by mouth daily. 08/07/20  Yes Ngetich, Dinah C, NP  bisacodyl (DULCOLAX) 10 MG suppository Place 10 mg rectally as needed for moderate constipation (If no bowel movement within 72 hours or Milk of Magnesia, May give 1 suppository rectally (or 1 tablet) as needed for constipation and inform MD.).   Yes [provider]  bisacodyl (DULCOLAX) 5 MG EC tablet Take 5 mg by mouth as needed for moderate constipation (If no bowel movement within 72 hours or Milk of Magnesia, May give 1 tablet by mouth (or 1 suppository) as needed for constipation and inform MD).   Yes [provider]  folic acid (FOLVITE) 1 MG tablet TAKE 1 TABLET BY MOUTH  EVERY DAY Patient taking differently: Take 1 mg by mouth daily. 06/03/21  Yes Ngetich, Dinah C, NP  gabapentin (NEURONTIN) 600 MG tablet Take 0.5 tablets (300 mg total) by mouth 3 (three) times daily. 06/10/22  Yes Ghimire, Henreitta Leber, MD  ibuprofen (ADVIL) 200 MG tablet Take 200 mg by mouth every 6 (six) hours as needed for fever, mild pain or moderate pain (For pain, oral temp >100, rectal temp >102 (or acetaminophen 650 mg by mouth every 4 hours)). **DO NOT GIVE MORE THAN 3,000 mg (Combined) IN 24 HOURS**   Yes [provider]  ketoconazole (NIZORAL) 2 % cream Apply 1 fingertip amount to each foot daily. 05/21/21  Yes Evelina Bucy, DPM  lidocaine (LIDODERM) 5 % Place 1 patch onto the skin daily as needed (For back pain). 06/10/22  Yes [provider]  loperamide (IMODIUM A-D) 2 MG tablet Take 2-4 mg by mouth as needed for diarrhea or loose stools (Give 2 tablets (4 mg) at onset of diarrhea, then 1 tablet (2 mg) after each stool. (Not to exceed 16 mg total) If diarrhea persists for 36 hours, notify MD.).   Yes [provider]  magnesium hydroxide (MILK OF MAGNESIA) 400 MG/5ML suspension Take 30 mLs by mouth 2 (two) times daily as needed for mild constipation or moderate constipation (*Use Miralax if Milk of Magnesia IS CONTRANDICATED (  example: Dialysis resident)*).   Yes [provider]  meclizine (DRAMAMINE) 25 MG tablet Take 25 mg by mouth as needed for nausea (May repeat in 1 hour if ineffective. If nausea persists after second dose, call MD.).   Yes [provider]  metoprolol succinate (TOPROL-XL) 25 MG 24 hr tablet Take 1 tablet (25 mg total) by mouth daily. Take with or immediately following a meal. Patient taking differently: Take 12.5 mg by mouth daily. Take with or immediately following a meal. 06/10/22  Yes Ghimire, Henreitta Leber, MD  nystatin cream (MYCOSTATIN) Apply 1 Application topically 2 (two) times daily as needed (For cutaneous or mucocutaneous  candida rash of abdomen folds, groin and under breasts for 21 days.). D/C order if rash is healed after 21 days. **Update MD if rash not resolved at that time**   Yes [provider]  omeprazole (PRILOSEC) 20 MG capsule TAKE 1 CAPSULE BY MOUTH EVERY DAY Patient taking differently: Take 20 mg by mouth daily. 01/28/21  Yes Ngetich, Dinah C, NP  Phenylephrine-DM-GG (ROBITUSSIN CHILD COUGH/COLD CF) 2.02-11-49 MG/5ML LIQD Take 10 mLs by mouth every 4 (four) hours as needed (For cough *Notify MD if cough persists, congestion x3 days or accompanied by fever*).   Yes [provider]  polyethylene glycol (MIRALAX / GLYCOLAX) 17 g packet Take 17 g by mouth daily as needed for mild constipation or moderate constipation (*Use Miralax if Milk of Magnesia IS CONTRAINDICATED (Example: Dialysis resident)*).   Yes [provider]  promethazine (PHENERGAN) 25 MG tablet Take 25 mg by mouth every 4 (four) hours as needed for nausea or vomiting (May use suppository if unable to take by mouth. Notify MD if nausea persists after second dose.).   Yes [provider]  silver sulfADIAZINE (SILVADENE) 1 % cream Apply 1 Application topically daily. 06/15/22  Yes [provider]  topiramate (TOPAMAX) 50 MG tablet TAKE 1 AND 1/2 TABLETS BY MOUTH DAILY Patient taking differently: Take 75 mg by mouth daily. 02/26/21  Yes Ngetich, Dinah C, NP  vitamin B-12 (CYANOCOBALAMIN) 1000 MCG tablet Take 1,000 mcg by mouth daily.   Yes [provider]  vitamin C (ASCORBIC ACID) 500 MG tablet Take 500 mg by mouth daily.    Yes [provider]     Critical care time: 68

## 2022-06-23 NOTE — Progress Notes (Signed)
   06/23/22 0117  Charting Type  Charting Type Full Reassessment Changes Noted  Focused Reassessment No Changes Respiratory  Respiratory  Respiratory Pattern Labored;Dyspnea at rest  ECG Monitoring  ECG Heart Rate (!) 101     Work of breathing is worsening, patient's saturation is at 87, will contact CCMD.

## 2022-06-23 NOTE — Consult Note (Signed)
NAME:  Darrell Horne, MRN:  [id number], DOB:  01-08-1946, LOS: 7 ADMISSION DATE:  06/16/2022, CONSULTATION DATE:  06/22/2022 REFERRING MD:  Dr. Louanne Belton, CHIEF COMPLAINT:  Acute Hypoxic Respiratory Failure, Pleural Effusion   History of Present Illness:  Darrell Horne is a 76 year old male with a past medical history of paroxysmal A-fib on Eliquis, left BKA, HTN, HLD, and alcoholic cirrhosis who presented to the hospital with altered mental status and was admitted on 06/16/2022.  He was found to have a large right-sided pleural effusion and his intermittently increased since August 26.  Thoracentesis on 06/17/2022 80 with 2 L of yellow fluid was found to be transudative pleural effusion.  Initially he did well after the procedure but over the past couple days has had increased oxygen requirements and episodes of hypotension.  Hypotension was treated with albumin and midodrine.  PCCM was consulted for further evaluation and management of his right-sided pleural effusion and acute hypoxic respiratory failure.  Pertinent  Medical History   Past Medical History:  Diagnosis Date   Chest pain    Diabetes mellitus    DM2 (diabetes mellitus, type 2) (HCC)    GERD (gastroesophageal reflux disease)    HLD (hyperlipidemia)    HTN (hypertension)    PAF (paroxysmal atrial fibrillation) ([place])    Palpitations      Significant Hospital Events: Including procedures, antibiotic start and stop dates in addition to other pertinent events   9/8 thoracentesis with return of 1.2 L of transudative fluid 9/12 increasing oxygen requirements with up to 6 L high flow nasal cannula and episodes of hypotension with systolic of 89.  Albumin, midodrine, and tube feeds started. 9/13 PCCM consulted, Eliquis held for planned thoracentesis tomorrow  Interim History / Subjective:  See HPI  Objective   Blood pressure 101/62, pulse (!) 122, temperature 99.1 F (37.3 C), temperature source Axillary, resp. rate (!) 26, height 5'  10" (1.778 m), weight 93.9 kg, SpO2 (!) 86 %.        Intake/Output Summary (Last 24 hours) at 06/23/2022 0524 Last data filed at 06/23/2022 0400 Gross per 24 hour  Intake 1568.75 ml  Output 1400 ml  Net 168.75 ml    Filed Weights   06/19/22 0500 06/20/22 0429 06/23/22 0352  Weight: 101 kg 99.4 kg 93.9 kg    Examination: General: Ill-appearing elderly gentleman sitting in bed in mild respiratory distress HENT: [place]/AT Lungs: Diminished lung sounds right greater than left Cardiovascular: Irregularly irregular rhythm with a tachycardic  rate Abdomen: Soft and nontender Extremities: Severe edema in the bilateral upper and lower extremities Neuro: Alert and oriented x3  Resolved Hospital Problem list     Assessment & Plan:  Right-sided pleural effusion Acute hypoxic respiratory failure due to pulmonary edema Anasarca Poor protoplasm RV dysfunction Cirrhosis Despite 1 week of inpatient hospital care he has worsening volume status and respiratory status.  Likely has not achieved near adequate diuresis.  His anasarca is exacerbated by hypoalbuminemia and severe protein calorie malnutrition as well as protal hypertension.  He has signs of RV dysfunction on TTE.  He is not febrile.  Possibly developing hospital-acquired pneumonia vs aspiration in setting of encephalopathy. -- Lasix 80 mg IV, minimal urine output of 40 mg IV earlier this shift -- Escalate to BiPAP now with O2 sats low 90s on salter 15 L -- Thoracentesis as able, deferred yesterday in the setting of Eliquis administration, this is likely small contribution to his hypoxemia, more pulmonary edema and possible pneumonia --  Broaden antibiotics to cefepime and linezolid, attempt to obtain lower respiratory culture  Best Practice (right click and "Reselect all SmartList Selections" daily)   Diet/type: tubefeeds DVT prophylaxis: DOAC on hold for planned procedure GI prophylaxis: N/A Lines: N/A Foley:  N/A Code Status:  full  code Last date of multidisciplinary goals of care discussion [06/22/2022]  Labs   CBC: Recent Labs  Lab 06/16/22 1510 06/17/22 0342 06/17/22 0937 06/18/22 0820 06/19/22 0202 06/20/22 0811 06/21/22 1120 06/22/22 0343  WBC 3.8* 5.1  --  8.4 9.2 4.8 4.8 9.2  NEUTROABS 2.5 3.0  --  5.8  --   --   --   --   HGB 12.0* 11.8*   < > 12.3* 13.6 10.1* 10.3* 11.5*  HCT 37.3* 36.8*   < > 37.0* 40.9 31.0* 30.8* 33.7*  MCV 107.5* 109.5*  --  102.8* 102.0* 102.0* 101.3* 101.5*  PLT 149* 163  --  175 186 PLATELET CLUMPS NOTED ON SMEAR, COUNT APPEARS DECREASED 104* 169   < > = values in this interval not displayed.     Basic Metabolic Panel: Recent Labs  Lab 06/18/22 0820 06/19/22 0202 06/20/22 0811 06/21/22 1120 06/21/22 1904 06/22/22 0343 06/22/22 1659  NA 137 137 137 138  --  139  --   K 4.0 3.5 3.2* 3.4*  --  3.8  --   CL 110 108 109 107  --  106  --   CO2 '22 23 25 25  '$ --  26  --   GLUCOSE 83 117* 78 106*  --  130*  --   BUN '12 12 13 12  '$ --  12  --   CREATININE 0.61 0.66 0.59* 0.62  --  0.64  --   CALCIUM 8.1* 8.3* 8.0* 8.6*  --  8.6*  --   MG  --   --  1.9 1.8 2.2 2.1 2.0  PHOS  --   --   --   --  3.5 3.2 3.0    GFR: Estimated Creatinine Clearance: 90.4 mL/min (by C-G formula based on SCr of 0.64 mg/dL). Recent Labs  Lab 06/16/22 1510 06/17/22 0342 06/19/22 0202 06/20/22 0811 06/21/22 1120 06/22/22 0343  PROCALCITON <0.10  --   --   --   --   --   WBC 3.8*   < > 9.2 4.8 4.8 9.2   < > = values in this interval not displayed.     Liver Function Tests: Recent Labs  Lab 06/16/22 1510 06/17/22 0342 06/18/22 0820 06/22/22 0343  AST 51* 38 29 36  ALT 33 33 25 28  ALKPHOS 196* 176* 166* 195*  BILITOT 0.7 0.8 0.8 1.3*  PROT 5.4* 5.4* 5.3* 6.2*  ALBUMIN 1.8* 1.8* 1.8* 2.6*    No results for input(s): "LIPASE", "AMYLASE" in the last 168 hours. Recent Labs  Lab 06/16/22 1510  AMMONIA 39*     ABG    Component Value Date/Time   PHART 7.4 06/22/2022 2115    PCO2ART 46 06/22/2022 2115   PO2ART 73 (L) 06/22/2022 2115   HCO3 27.9 06/22/2022 2115   TCO2 29 06/17/2022 0937   O2SAT 96.8 06/22/2022 2115     Coagulation Profile: No results for input(s): "INR", "PROTIME" in the last 168 hours.  Cardiac Enzymes: Recent Labs  Lab 06/17/22 0342  CKTOTAL 35*     HbA1C: Hgb A1c MFr Bld  Date/Time Value Ref Range Status  04/19/2022 10:39 AM 4.8 <5.7 % of total Hgb Final  Comment:    For the purpose of screening for the presence of diabetes: . <5.7%       Consistent with the absence of diabetes 5.7-6.4%    Consistent with increased risk for diabetes             (prediabetes) > or =6.5%  Consistent with diabetes . This assay result is consistent with a decreased risk of diabetes. . Currently, no consensus exists regarding use of hemoglobin A1c for diagnosis of diabetes in children. . According to American Diabetes Association (ADA) guidelines, hemoglobin A1c <7.0% represents optimal control in non-pregnant diabetic patients. Different metrics may apply to specific patient populations.  Standards of Medical Care in Diabetes(ADA). Marland Kitchen   03/24/2021 10:52 AM 5.6 <5.7 % of total Hgb Final    Comment:    For the purpose of screening for the presence of diabetes: . <5.7%       Consistent with the absence of diabetes 5.7-6.4%    Consistent with increased risk for diabetes             (prediabetes) > or =6.5%  Consistent with diabetes . This assay result is consistent with a decreased risk of diabetes. . Currently, no consensus exists regarding use of hemoglobin A1c for diagnosis of diabetes in children. . According to American Diabetes Association (ADA) guidelines, hemoglobin A1c <7.0% represents optimal control in non-pregnant diabetic patients. Different metrics may apply to specific patient populations.  Standards of Medical Care in Diabetes(ADA). .     CBG: Recent Labs  Lab 06/22/22 1203 06/22/22 1650 06/22/22 2034  06/22/22 2328 06/23/22 0351  GLUCAP 190* 202* 166* 142* 143*     Review of Systems:   Dyspnea.  Past Medical History:  He,  has a past medical history of Chest pain, Diabetes mellitus, DM2 (diabetes mellitus, type 2) ([place]), GERD (gastroesophageal reflux disease), HLD (hyperlipidemia), HTN (hypertension), PAF (paroxysmal atrial fibrillation) ([place]), and Palpitations.   Surgical History:   Past Surgical History:  Procedure Laterality Date   APPENDECTOMY     CHOLECYSTECTOMY     COLONOSCOPY     Dr. Michail Sermon   COLONOSCOPY WITH PROPOFOL N/A 11/06/2018   Procedure: COLONOSCOPY WITH PROPOFOL;  Surgeon: Wilford Corner, MD;  Location: [facility] ENDOSCOPY;  Service: Endoscopy;  Laterality: N/A;   HERNIA REPAIR     IR THORACENTESIS ASP PLEURAL SPACE W/IMG GUIDE  06/17/2022   LEG AMPUTATION Left [id number]   Dr. Kendell Bane   POLYPECTOMY  11/06/2018   Procedure: POLYPECTOMY;  Surgeon: Wilford Corner, MD;  Location: [facility] ENDOSCOPY;  Service: Endoscopy;;   SHOULDER SURGERY  [id number]   Dr. Meridee Score   TOTAL ANKLE ARTHROPLASTY  [id number]     Social History:   reports that he quit smoking about 40 years ago. His smoking use included cigarettes. He has never used smokeless tobacco. He reports current alcohol use. He reports that he does not use drugs.   Family History:  His family history includes Cancer in his sister; Diabetes in his sister; Heart disease in his mother; Stroke (age of onset: 12) in his father.   Allergies No Known Allergies   Home Medications  Prior to Admission medications   Medication Sig Start Date End Date Taking? Authorizing Provider  acetaminophen (TYLENOL) 500 MG tablet Take 1 tablet (500 mg total) by mouth every 6 (six) hours as needed for moderate pain. 05/27/22  Yes Ngetich, Dinah C, NP  acetaminophen (TYLENOL) 650 MG CR tablet Take 650 mg by mouth See admin instructions. Give  1 tablet (650 mg) every 4 to 6 hours as needed for pain or oral temp >100, rectal temp >102  **DO NOT GIVE MORE THAN 3,000 MG (Combined) IN 24 HOURS**   Yes [provider]  alum & mag hydroxide-simeth (MAALOX PLUS) 400-400-40 MG/5ML suspension Take 30 mLs by mouth 3 (three) times daily as needed for indigestion (Not to exceed 3 doses daily. Inform MD if used more than 3 days.). **Do not give at same time as antibiotics, Digoxin or Coumadin**   Yes [provider]  Amino Acids-Protein Hydrolys (PRO-STAT AWC) LIQD Take 30 mLs by mouth in the morning and at bedtime.   Yes [provider]  amiodarone (PACERONE) 200 MG tablet Take 1 tablet (200 mg total) by mouth 2 (two) times daily. 05/10/22  Yes Jerline Pain, MD  amitriptyline (ELAVIL) 50 MG tablet TAKE 1 TABLET BY MOUTH EVERYDAY AT BEDTIME Patient taking differently: Take 50 mg by mouth at bedtime. 02/11/21  Yes Ngetich, Dinah C, NP  apixaban (ELIQUIS) 5 MG TABS tablet Take 1 tablet (5 mg total) by mouth 2 (two) times daily. 05/20/21  Yes Ngetich, Dinah C, NP  atorvastatin (LIPITOR) 40 MG tablet Take 1 tablet (40 mg total) by mouth daily. 08/07/20  Yes Ngetich, Dinah C, NP  bisacodyl (DULCOLAX) 10 MG suppository Place 10 mg rectally as needed for moderate constipation (If no bowel movement within 72 hours or Milk of Magnesia, May give 1 suppository rectally (or 1 tablet) as needed for constipation and inform MD.).   Yes [provider]  bisacodyl (DULCOLAX) 5 MG EC tablet Take 5 mg by mouth as needed for moderate constipation (If no bowel movement within 72 hours or Milk of Magnesia, May give 1 tablet by mouth (or 1 suppository) as needed for constipation and inform MD).   Yes [provider]  folic acid (FOLVITE) 1 MG tablet TAKE 1 TABLET BY MOUTH EVERY DAY Patient taking differently: Take 1 mg by mouth daily. 06/03/21  Yes Ngetich, Dinah C, NP  gabapentin (NEURONTIN) 600 MG tablet Take 0.5 tablets (300 mg total) by mouth 3 (three) times daily. 06/10/22  Yes Ghimire, Henreitta Leber, MD  ibuprofen (ADVIL) 200 MG  tablet Take 200 mg by mouth every 6 (six) hours as needed for fever, mild pain or moderate pain (For pain, oral temp >100, rectal temp >102 (or acetaminophen 650 mg by mouth every 4 hours)). **DO NOT GIVE MORE THAN 3,000 mg (Combined) IN 24 HOURS**   Yes [provider]  ketoconazole (NIZORAL) 2 % cream Apply 1 fingertip amount to each foot daily. 05/21/21  Yes Evelina Bucy, DPM  lidocaine (LIDODERM) 5 % Place 1 patch onto the skin daily as needed (For back pain). 06/10/22  Yes [provider]  loperamide (IMODIUM A-D) 2 MG tablet Take 2-4 mg by mouth as needed for diarrhea or loose stools (Give 2 tablets (4 mg) at onset of diarrhea, then 1 tablet (2 mg) after each stool. (Not to exceed 16 mg total) If diarrhea persists for 36 hours, notify MD.).   Yes [provider]  magnesium hydroxide (MILK OF MAGNESIA) 400 MG/5ML suspension Take 30 mLs by mouth 2 (two) times daily as needed for mild constipation or moderate constipation (*Use Miralax if Milk of Magnesia IS CONTRANDICATED (example: Dialysis resident)*).   Yes [provider]  meclizine (DRAMAMINE) 25 MG tablet Take 25 mg by mouth as needed for nausea (May repeat in 1 hour if ineffective. If nausea persists after  second dose, call MD.).   Yes [provider]  metoprolol succinate (TOPROL-XL) 25 MG 24 hr tablet Take 1 tablet (25 mg total) by mouth daily. Take with or immediately following a meal. Patient taking differently: Take 12.5 mg by mouth daily. Take with or immediately following a meal. 06/10/22  Yes Ghimire, Henreitta Leber, MD  nystatin cream (MYCOSTATIN) Apply 1 Application topically 2 (two) times daily as needed (For cutaneous or mucocutaneous candida rash of abdomen folds, groin and under breasts for 21 days.). D/C order if rash is healed after 21 days. **Update MD if rash not resolved at that time**   Yes [provider]  omeprazole (PRILOSEC) 20 MG capsule TAKE 1 CAPSULE BY MOUTH EVERY  DAY Patient taking differently: Take 20 mg by mouth daily. 01/28/21  Yes Ngetich, Dinah C, NP  Phenylephrine-DM-GG (ROBITUSSIN CHILD COUGH/COLD CF) 2.02-11-49 MG/5ML LIQD Take 10 mLs by mouth every 4 (four) hours as needed (For cough *Notify MD if cough persists, congestion x3 days or accompanied by fever*).   Yes [provider]  polyethylene glycol (MIRALAX / GLYCOLAX) 17 g packet Take 17 g by mouth daily as needed for mild constipation or moderate constipation (*Use Miralax if Milk of Magnesia IS CONTRAINDICATED (Example: Dialysis resident)*).   Yes [provider]  promethazine (PHENERGAN) 25 MG tablet Take 25 mg by mouth every 4 (four) hours as needed for nausea or vomiting (May use suppository if unable to take by mouth. Notify MD if nausea persists after second dose.).   Yes [provider]  silver sulfADIAZINE (SILVADENE) 1 % cream Apply 1 Application topically daily. 06/15/22  Yes [provider]  topiramate (TOPAMAX) 50 MG tablet TAKE 1 AND 1/2 TABLETS BY MOUTH DAILY Patient taking differently: Take 75 mg by mouth daily. 02/26/21  Yes Ngetich, Dinah C, NP  vitamin B-12 (CYANOCOBALAMIN) 1000 MCG tablet Take 1,000 mcg by mouth daily.   Yes [provider]  vitamin C (ASCORBIC ACID) 500 MG tablet Take 500 mg by mouth daily.    Yes [provider]     Critical care time:     CRITICAL CARE Performed by: Lanier Clam   Total critical care time: 40 minutes  Critical care time was exclusive of separately billable procedures and treating other patients.  Critical care was necessary to treat or prevent imminent or life-threatening deterioration.  Critical care was time spent personally by me on the following activities: development of treatment plan with patient and/or surrogate as well as nursing, discussions with consultants, evaluation of patient's response to treatment, examination of patient, obtaining history from patient or  surrogate, ordering and performing treatments and interventions, ordering and review of laboratory studies, ordering and review of radiographic studies, pulse oximetry and re-evaluation of patient's condition.

## 2022-06-23 NOTE — Progress Notes (Signed)
eLink Physician-Brief Progress Note Patient Name: LEMONT SITZMANN DOB: 07/05/1946 MRN: [id number]   Date of Service  06/23/2022  HPI/Events of Note  75/M with history of DM, GERD, HTN, HLD, s/p left BKA, cirrhosis, and persistent atrial fibrillation on Eliquis who was admitted with altered mental status on 06/16/22, transferred to the ICU for rescue BIPAP.   He had thoracentesis 06/27/22 for right sided effusion removing 2L of transudative fluid.  He has had increase O2 requirements since and also with episodes of hypotension.    BP 115/82, HR 95, RR 23, o2 sats 100% on BIPAP.   CXR with bilateral pleural effusions and infiltrates.    eICU Interventions  Agree with aggressive diuresis.   Broad spectrum antibiotics. Continue on BIPAP. Hold eliquis for possible thoracentesis.      Intervention Category Evaluation Type: New Patient Evaluation  Elsie Lincoln 06/23/2022, 5:28 AM

## 2022-06-23 NOTE — Progress Notes (Signed)
eLink Physician-Brief Progress Note Patient Name: Darrell Horne DOB: [birth year] MRN: [id number]   Date of Service  06/23/2022  HPI/Events of Note  AFIB - Patient has scheduled Metoprolol for AFIB HR control. However, SBP has been running in 80-90 range.   eICU Interventions  Plan: Hold Metoprolol dose for SBP < 100 or HR < 60.     Intervention Category Major Interventions: Other:;Arrhythmia - evaluation and management  Lysle Dingwall 06/23/2022, 9:46 PM

## 2022-06-23 NOTE — Procedures (Signed)
Insertion of Chest Tube Procedure Note  Darrell Horne  [id number]  [birth year]  Date:06/23/22  Time:9:44 AM    Provider Performing: Johny Blamer   Procedure: Chest Tube Insertion [phone])  Indication(s) Effusion  Consent Risks of the procedure as well as the alternatives and risks of each were explained to the patient and/or caregiver.  Consent for the procedure was obtained and is signed in the bedside chart  Anesthesia Topical only with 1% lidocaine    Time Out Verified patient identification, verified procedure, site/side was marked, verified correct patient position, special equipment/implants available, medications/allergies/relevant history reviewed, required imaging and test results available.   Sterile Technique Maximal sterile technique including full sterile barrier drape, hand hygiene, sterile gown, sterile gloves, mask, hair covering, sterile ultrasound probe cover (if used).   Procedure Description Ultrasound used to identify appropriate pleural anatomy for placement and overlying skin marked. Area of placement cleaned and draped in sterile fashion.  A 14 French pigtail pleural catheter was placed into the right pleural space using Seldinger technique. Appropriate return of initially 2 L of yellow fluid was obtained.  The tube was connected to atrium and placed on -20 cm H2O wall suction.   Complications/Tolerance None; patient tolerated the procedure well. Chest X-ray is ordered to verify placement.   EBL Minimal  Specimen(s) none

## 2022-06-23 NOTE — Progress Notes (Signed)
eLink Physician-Brief Progress Note Patient Name: Darrell Horne DOB: [birth year] MRN: [id number]   Date of Service  06/23/2022  HPI/Events of Note  Hypotension - BP = 75/52. No central venous line or CVP. Hx of AFIB with RVR. On Amiodarone and Metoprolol PO.   eICU Interventions  Plan: Phenylephrine IV infusion via PIV. Titrate to MAP >= 65.     Intervention Category Major Interventions: Hypotension - evaluation and management  Lysle Dingwall 06/23/2022, 10:33 PM

## 2022-06-23 NOTE — Progress Notes (Signed)
IVT present for PIV placement assessment.  Providers at bedside to begin a procedure.  RN advised IVT will come back.

## 2022-06-23 NOTE — Progress Notes (Signed)
OT Cancellation Note  Patient Details Name: Darrell Horne MRN: [id number] DOB: Jun 01, 1946   Cancelled Treatment:    Reason Eval/Treat Not Completed: Medical issues which prohibited therapy (Pt requiring Bipap, 2 chest tubes placed this morning.)  [name] 06/23/2022, 10:00 AM Cleta Alberts, OTR/L [place] Office: [phone]

## 2022-06-23 NOTE — Consult Note (Signed)
Consultation Note Date: 06/23/2022   Patient Name: Darrell Horne  DOB: May 18, 1946  MRN: [id number]  Age / Sex: 76 y.o., male  PCP: Ngetich, Nelda Bucks, NP Referring Physician: Candee Furbish, MD  Reason for Consultation: Establishing goals of care  HPI/Patient Profile: 76 year old male with a past medical history of paroxysmal A-fib on Eliquis, left BKA, HTN, HLD, and alcoholic cirrhosis who presented to the hospital with altered mental status and was admitted on 06/16/2022. He was found to have a large right-sided pleural effusion and his intermittently increased since August 26.  Thoracentesis on 06/17/2022 with 2 L of yellow fluid was found to be transudative pleural effusion. Post procedure developed increased oxygen requirements and hypotension. 06/22/2022 PCCM consulted for evaluation and management of his right-sided pleural effusion and acute hypoxic respiratory failure.   Clinical Assessment and Goals of Care:  I have reviewed medical records including EPIC notes, labs and imaging, received report from Jenny Reichmann, Therapist, sports, assessed the patient and then met with Darrell Horne  to discuss diagnosis prognosis, [place], EOL wishes, disposition and options.  Met with patients spouse, Darrell Horne, at bedside today to discuss [place].I introduced Palliative Medicine as specialized medical care for people living with serious illness. It focuses on providing relief from the symptoms and stress of a serious illness. The goal is to improve quality of life for both the patient and the family.  We discussed a brief life review of the patient. She stated that prior to his initial decline in health, [name] was mostly independent, using his wheelchair for mobility and able to get himself out of the bed. She states that he use to cook for himself but stopped after he burned himself while cooking. He relies on microwave dinners for his primary source of nutrition. She tells me about encouraging Mr.  Vick to drink protein shakes but he was not consistent with this. Darrell Horne states that prior to getting sick, they were planning a trip to the mountains as [name] enjoys being outdoors.   We discussed patient's current illness and what it means in the larger context of patient's on-going co-morbidities.  Natural disease trajectory and expectations at EOL were discussed. Darrell Horne is aware of he patients current clinical status with increased oxygenation requirements (BiPAP) and pleural effusions requiring a chest tubes. We also discussion his malnutrition and hypoalbuminemia.   I attempted to elicit values and goals of care important to the patient. Darrell Horne states that she believes that Mr. Delcarlo would want to be intubated if it came to that but not long-term. Advance directives, concepts specific to code status, were discussed. Darrell Horne would like to keep [name] a Full Code at this time as she believes he would want CPR.   Discussed with Darrell Horne the importance of continued conversation with the medical providers regarding overall plan of care and treatment options, ensuring decisions are within the context of the patient's values and GOCs.    Questions and concerns were addressed. The family was encouraged to call with questions or concerns.     Primary Decision Maker NEXT OF KIN - [name] - Spouse    SUMMARY OF RECOMMENDATIONS   - Continue full scope treatment/full code - PMT continue to follow   Code Status/Advance Care Planning: Full code  Symptom Management:  Per Primary Team  Additional Recommendations (Limitations, Scope, Preferences): Full Scope Treatment  Prognosis:  Unable to determine  Discharge Planning: To Be Determined      Primary Diagnoses: Present on Admission:  Acute  encephalopathy  (Resolved) Pancytopenia (HCC)  Pleural effusion on right  PAF (paroxysmal atrial fibrillation) (HCC)  HLD (hyperlipidemia)  HTN (hypertension)  Overdose by ingestion   I have  reviewed the medical record, interviewed the patient and family, and examined the patient. The following aspects are pertinent.  Past Medical History:  Diagnosis Date   Chest pain    Diabetes mellitus    DM2 (diabetes mellitus, type 2) (HCC)    GERD (gastroesophageal reflux disease)    HLD (hyperlipidemia)    HTN (hypertension)    PAF (paroxysmal atrial fibrillation) (HCC)    Palpitations    Social History   Socioeconomic History   Marital status: Married    Spouse name: Not on file   Number of children: 4   Years of education: Not on file   Highest education level: Not on file  Occupational History   Occupation: retired Administrator  Tobacco Use   Smoking status: Former    Years: 12.00    Types: Cigarettes    Quit date: 10/10/1981    Years since quitting: 40.7   Smokeless tobacco: Never  Vaping Use   Vaping Use: Never used  Substance and Sexual Activity   Alcohol use: Yes    Comment: has glass of wine once a week but states rarely    Drug use: No   Sexual activity: Never  Other Topics Concern   Not on file  Social History Narrative   Diet:   Do you drink/eat things with caffeine?  Yes   Marital status:  Divorced.  What year were you married?  2001   Do you live in a house, assisted living, condo, apartment, trailer, etc.?  Apartment   Is it one or more stories?  1 story   How many persons live in your home?  1   Do you have any pets in your home?  No   Current or past profession:  Truck driver   Do you exercise?  Some  Type and how often:  Walking   Social Determinants of Health   Financial Resource Strain: Low Risk  (01/24/2018)   Overall Financial Resource Strain (CARDIA)    Difficulty of Paying Living Expenses: Not hard at all  Food Insecurity: No Food Insecurity (01/24/2018)   Hunger Vital Sign    Worried About Running Out of Food in the Last Year: Never true    Ran Out of Food in the Last Year: Never true  Transportation Needs: No Transportation Needs  (01/24/2018)   PRAPARE - Hydrologist (Medical): No    Lack of Transportation (Non-Medical): No  Physical Activity: Inactive (01/24/2018)   Exercise Vital Sign    Days of Exercise per Week: 0 days    Minutes of Exercise per Session: 0 min  Stress: Stress Concern Present (01/24/2018)   [place]    Feeling of Stress : To some extent  Social Connections: Moderately Isolated (01/24/2018)   Social Connection and Isolation Panel [NHANES]    Frequency of Communication with Friends and Family: Once a week    Frequency of Social Gatherings with Friends and Family: Once a week    Attends Religious Services: More than 4 times per year    Active Member of [employer] or Organizations: No    Attends Archivist Meetings: Never    Marital Status: Divorced   Family History  Problem Relation Age of Onset   Heart  disease Mother        Living   Stroke Father 75       Deceased   Diabetes Sister    Cancer Sister    Scheduled Meds:  amiodarone  200 mg Per Tube BID   amitriptyline  50 mg Per Tube QHS   atorvastatin  40 mg Per Tube Daily   Chlorhexidine Gluconate Cloth  6 each Topical Daily   feeding supplement (PROSource TF20)  60 mL Per Tube Daily   folic acid  1 mg Per Tube Daily   free water  50 mL Per Tube Q4H   furosemide  80 mg Intravenous Q8H   linezolid  600 mg Per Tube Q12H   metoprolol tartrate  25 mg Per Tube BID   mupirocin cream   Topical Daily   mouth rinse  15 mL Mouth Rinse 4 times per day   polyethylene glycol  17 g Per Tube Daily   topiramate  75 mg Per Tube Daily   Continuous Infusions:  albumin human 25 g (06/23/22 0850)   ceFEPime (MAXIPIME) IV     feeding supplement (OSMOLITE 1.5 CAL) Stopped (06/23/22 0430)   norepinephrine Stopped (06/23/22 1032)   PRN Meds:.acetaminophen **OR** acetaminophen, fentaNYL (SUBLIMAZE) injection, lidocaine (PF), norepinephrine, mouth  rinse No Known Allergies Review of Systems  Unable to perform ROS: Mental status change   Physical Exam Vitals and nursing note reviewed.  Constitutional:      Appearance: He is ill-appearing.  Cardiovascular:     Rate and Rhythm: Normal rate. Rhythm irregular.  Pulmonary:     Comments: BiPAP Dependent Neurological:     Mental Status: He is disoriented.     Vital Signs: BP 100/60   Pulse 91   Temp 98.9 F (37.2 C) (Axillary)   Resp (!) 26   Ht 5' 10"  (1.778 m)   Wt 91.3 kg   SpO2 99%   BMI 28.88 kg/m  Pain Scale: CPOT   Pain Score: Asleep   SpO2: SpO2: 99 % O2 Device:SpO2: 99 % O2 Flow Rate: .O2 Flow Rate (L/min): 15 L/min  IO: Intake/output summary:  Intake/Output Summary (Last 24 hours) at 06/23/2022 1240 Last data filed at 06/23/2022 1015 Gross per 24 hour  Intake 1618.75 ml  Output 3420 ml  Net -1801.25 ml    LBM: Last BM Date : 06/18/22 Baseline Weight: Weight: 91.5 kg Most recent weight: Weight: 91.3 kg     Palliative Assessment/Data: 10%     *Please note that this is a verbal dictation therefore any spelling or grammatical errors are due to the "[employer]" system interpretation.  Erma Heritage, NP student  Juel Burrow, [place], AGNP-C Palliative Medicine Team [phone] Pager: [phone]

## 2022-06-23 NOTE — Procedures (Signed)
Insertion of Chest Tube Procedure Note  [facility]  [id number]  [birth year]  Date:06/23/22  Time:9:37 AM    Provider Performing: Dorethea Clan   Procedure: Chest Tube Insertion (29244)  Indication(s) Effusion  Consent Risks of the procedure as well as the alternatives and risks of each were explained to the patient and/or caregiver.  Consent for the procedure was obtained and is signed in the bedside chart  Anesthesia Topical only with 1% lidocaine    Time Out Verified patient identification, verified procedure, site/side was marked, verified correct patient position, special equipment/implants available, medications/allergies/relevant history reviewed, required imaging and test results available.   Sterile Technique Maximal sterile technique including full sterile barrier drape, hand hygiene, sterile gown, sterile gloves, mask, hair covering, sterile ultrasound probe cover (if used).   Procedure Description Ultrasound used to identify appropriate pleural anatomy for placement and overlying skin marked. Area of placement cleaned and draped in sterile fashion.  A 14 French pigtail pleural catheter was placed into the right pleural space using Seldinger technique. Appropriate return of fluid was obtained.  The tube was connected to atrium and placed on -20 cm H2O wall suction.   Complications/Tolerance None; patient tolerated the procedure well. Chest X-ray is ordered to verify placement.   EBL Minimal  Specimen(s) none

## 2022-06-23 NOTE — Progress Notes (Signed)
   06/23/22 0432  Vitals  Resp (!) 26  Oxygen Therapy  SpO2 (!) 86 %  O2 Device HFNC  MEWS Score  MEWS Temp 0  MEWS Systolic 0  MEWS Pulse 1  MEWS RR 2  MEWS LOC 0  MEWS Score 3  MEWS Score Color Yellow    Critical care contacted regarding increased work of breathing and O2 sats.

## 2022-06-23 NOTE — Progress Notes (Signed)
At bedside with [place].  Pt with significant BUE edema and bruising present. Dr Tamala Julian at bedside states he will place a CVC, no need to stick again at this time.

## 2022-06-23 NOTE — Progress Notes (Signed)
Afternoon ronds  Resting comfortably, good output from chest tubes, PMT meeting noted.  Will need PT/OT, continued aggressive diuresis.  See if he can tolerate being off BIPAP through night and can likely leave ICU in AM.  Erskine Emery MD PCCM

## 2022-06-23 NOTE — Progress Notes (Signed)
An USGPIV (ultrasound guided PIV) has been placed for short-term vasopressor infusion. A correctly placed ivWatch must be used when administering Vasopressors. Should this treatment be needed beyond 72 hours, central line access should be obtained.  It will be the responsibility of the bedside nurse to follow best practice to prevent extravasations.   ?

## 2022-06-23 NOTE — Progress Notes (Signed)
Rounding Note    Patient Name: Darrell Horne Date of Encounter: 06/23/2022  [place] Cardiologist: None   Subjective   Unable to assess.  On BiPAP and sleeping.   Inpatient Medications    Scheduled Meds:  amiodarone  200 mg Per Tube BID   amitriptyline  50 mg Per Tube QHS   atorvastatin  40 mg Per Tube Daily   Chlorhexidine Gluconate Cloth  6 each Topical Daily   feeding supplement (PROSource TF20)  60 mL Per Tube Daily   folic acid  1 mg Per Tube Daily   free water  50 mL Per Tube Q4H   furosemide  80 mg Intravenous Q8H   linezolid  600 mg Per Tube Q12H   metoprolol tartrate  25 mg Per Tube BID   mupirocin cream   Topical Daily   mouth rinse  15 mL Mouth Rinse 4 times per day   polyethylene glycol  17 g Per Tube Daily   topiramate  75 mg Per Tube Daily   Continuous Infusions:  albumin human 25 g (06/23/22 0850)   ceFEPime (MAXIPIME) IV     feeding supplement (OSMOLITE 1.5 CAL) Stopped (06/23/22 0430)   norepinephrine Stopped (06/23/22 1032)   PRN Meds: acetaminophen **OR** acetaminophen, fentaNYL (SUBLIMAZE) injection, lidocaine (PF), norepinephrine, mouth rinse   Vital Signs    Vitals:   06/23/22 0800 06/23/22 0900 06/23/22 1100 06/23/22 1120  BP:  100/60    Pulse:    91  Resp:  (!) 23  (!) 26  Temp: 98.4 F (36.9 C)  98.9 F (37.2 C)   TempSrc: Axillary  Axillary   SpO2:    99%  Weight:      Height:        Intake/Output Summary (Last 24 hours) at 06/23/2022 1242 Last data filed at 06/23/2022 1015 Gross per 24 hour  Intake 1618.75 ml  Output 3420 ml  Net -1801.25 ml      06/23/2022    5:36 AM 06/23/2022    3:52 AM 06/20/2022    4:29 AM  Last 3 Weights  Weight (lbs) 201 lb 4.5 oz 207 lb 0.2 oz 219 lb 2.2 oz  Weight (kg) 91.3 kg 93.9 kg 99.4 kg      Telemetry    Atrial fibrillation.  Rate 90s - Personally Reviewed  ECG    N/a - Personally Reviewed  Physical Exam   VS:  BP 100/60   Pulse 91   Temp 98.9 F (37.2 C)  (Axillary)   Resp (!) 26   Ht '5\' 10"'$  (1.778 m)   Wt 91.3 kg   SpO2 99%   BMI 28.88 kg/m  , BMI Body mass index is 28.88 kg/m. GENERAL:  Ill-appearing.  On BiPAP HEENT: Pupils equal round and reactive, fundi not visualized, oral mucosa unremarkable NECK:  No jugular venous distention, waveform within normal limits, carotid upstroke brisk and symmetric, no bruits, no thyromegaly LUNGS:  Clear to auscultation bilaterally on anterior exam HEART:  Irregularly irregular.  PMI not displaced or sustained,S1 and S2 within normal limits, no S3, no S4, no clicks, no rubs, no murmurs ABD:  Flat, positive bowel sounds normal in frequency in pitch, no bruits, no rebound, no guarding, no midline pulsatile mass, no hepatomegaly, no splenomegaly EXT:  2 plus pulses throughout, no edema, no cyanosis no clubbing SKIN:  No rashes no nodules NEURO: Unable to assess PSYCH:  Unable to assess   Labs    High Sensitivity Troponin:  No results for input(s): "TROPONINIHS" in the last 720 hours.   Chemistry Recent Labs  Lab 06/17/22 0342 06/17/22 0937 06/18/22 0820 06/19/22 0202 06/21/22 1120 06/21/22 1904 06/22/22 0343 06/22/22 1659 06/23/22 0509  NA 140   < > 137   < > 138  --  139  --  140  K 4.2   < > 4.0   < > 3.4*  --  3.8  --  3.7  CL 112*  --  110   < > 107  --  106  --  107  CO2 23  --  22   < > 25  --  26  --  27  GLUCOSE 83  --  83   < > 106*  --  130*  --  139*  BUN 13  --  12   < > 12  --  12  --  18  CREATININE 0.56*  --  0.61   < > 0.62  --  0.64  --  0.65  CALCIUM 8.2*  --  8.1*   < > 8.6*  --  8.6*  --  8.9  MG 1.8  --   --    < > 1.8   < > 2.1 2.0 2.0  PROT 5.4*  --  5.3*  --   --   --  6.2*  --   --   ALBUMIN 1.8*  --  1.8*  --   --   --  2.6*  --   --   AST 38  --  29  --   --   --  36  --   --   ALT 33  --  25  --   --   --  28  --   --   ALKPHOS 176*  --  166*  --   --   --  195*  --   --   BILITOT 0.8  --  0.8  --   --   --  1.3*  --   --   GFRNONAA >60  --  >60   < > >60   --  >60  --  >60  ANIONGAP 5  --  5   < > 6  --  7  --  6   < > = values in this interval not displayed.    Lipids No results for input(s): "CHOL", "TRIG", "HDL", "LABVLDL", "LDLCALC", "CHOLHDL" in the last 168 hours.  Hematology Recent Labs  Lab 06/21/22 1120 06/22/22 0343 06/23/22 0509  WBC 4.8 9.2 10.2  RBC 3.04* 3.32* 3.20*  HGB 10.3* 11.5* 10.8*  HCT 30.8* 33.7* 33.0*  MCV 101.3* 101.5* 103.1*  MCH 33.9 34.6* 33.8  MCHC 33.4 34.1 32.7  RDW 13.7 13.8 14.0  PLT 104* 169 125*   Thyroid  Recent Labs  Lab 06/17/22 0342  TSH 4.698*    BNP Recent Labs  Lab 06/16/22 1510 06/19/22 0202  BNP 341.3* 298.9*    DDimer No results for input(s): "DDIMER" in the last 168 hours.   Radiology    DG CHEST PORT 1 VIEW  Result Date: 06/23/2022 CLINICAL DATA:  Chest tube placement EXAM: PORTABLE CHEST 1 VIEW COMPARISON:  06/22/2022 FINDINGS: New chest tubes bilaterally at the bases. Partially imaged enteric tube. Decreased pleural effusions with improved lung aeration. New lucency at the right apex likely reflecting a small pneumothorax. No definite left pneumothorax. Persistent bilateral pulmonary opacities. Stable cardiomediastinal contours. IMPRESSION: New bilateral  chest tubes with small pneumothorax at the right apex. Decreased pleural effusions with improved basilar atelectasis. Otherwise similar bilateral pulmonary opacities. These results will be called to the ordering clinician or representative by the Radiologist Assistant, and communication documented in the PACS or [employer]. Electronically Signed   By: Macy Mis M.D.   On: 06/23/2022 10:12   DG CHEST PORT 1 VIEW  Result Date: 06/22/2022 CLINICAL DATA:  Shortness of breath. EXAM: PORTABLE CHEST 1 VIEW COMPARISON:  Chest x-ray 06/22/2022 FINDINGS: Enteric tube extends below the diaphragm. Moderate right and small left pleural effusions persist. Central interstitial and airspace opacities appear unchanged. No evidence  for pneumothorax. The cardiomediastinal silhouette is stable and within normal limits. No acute fractures are seen. IMPRESSION: 1. Stable bilateral pulmonary infiltrates concerning for pulmonary edema. Pneumonia not excluded. 2. Stable pleural effusions. Electronically Signed   By: Ronney Asters M.D.   On: 06/22/2022 21:20   DG CHEST PORT 1 VIEW  Result Date: 06/22/2022 CLINICAL DATA:  Dyspnea. EXAM: PORTABLE CHEST 1 VIEW COMPARISON:  Chest x-ray from yesterday. FINDINGS: New feeding tube entering the stomach with the tip below the field of view. Stable cardiomediastinal silhouette. Diffuse bilateral airspace disease and interstitial thickening remains worse on the right, but has progressed on the left compared to prior. Unchanged moderate right and small left pleural effusions. No pneumothorax. No acute osseous abnormality. IMPRESSION: 1. Worsening pulmonary edema and/or pneumonia. 2. Unchanged moderate right and small left pleural effusions. 3. New feeding tube entering the stomach with the tip below the field of view. Electronically Signed   By: Titus Dubin M.D.   On: 06/22/2022 11:42   DG Abd Portable 1V  Result Date: 06/21/2022 CLINICAL DATA:  Feeding tube placement EXAM: PORTABLE ABDOMEN - 1 VIEW COMPARISON:  None Available. FINDINGS: Tip of feeding tube is seen in the distal antrum of the stomach. There is mild dilation of small bowel loops. IMPRESSION: Tip of feeding tube is seen in the distal antrum of the stomach. Electronically Signed   By: Elmer Picker M.D.   On: 06/21/2022 16:15   DG CHEST PORT 1 VIEW  Result Date: 06/21/2022 CLINICAL DATA:  Right pleural effusion EXAM: PORTABLE CHEST 1 VIEW COMPARISON:  Chest 06/17/2022 FINDINGS: Extensive infiltrate throughout the right lung which has progressed considerably. Moderate right effusion has progressed. Left lower lobe infiltrate and effusion with mild improvement. IMPRESSION: Extensive infiltrate throughout the right lung with  progression. Progressive right pleural effusion. Possible pneumonia Left lower lobe infiltrate and effusion with mild improvement. Electronically Signed   By: Franchot Gallo M.D.   On: 06/21/2022 15:51    Cardiac Studies   Echo 06/20/22:  1. Left ventricular ejection fraction, by estimation, is 55 to 60%. The  left ventricle has normal function. The left ventricle has no regional  wall motion abnormalities. Left ventricular diastolic parameters are  indeterminate.   2. Right ventricular systolic function is low normal. The right  ventricular size is normal. There is mildly elevated pulmonary artery  systolic pressure. The estimated right ventricular systolic pressure is  53.6 mmHg.   3. Large pleural effusion in the left lateral region.   4. The mitral valve is grossly normal. Mild to moderate mitral valve  regurgitation.   5. The aortic valve was not well visualized. Aortic valve regurgitation  is trivial. No aortic stenosis is present.   Patient Profile     76 y.o. male with HTN, LH, L BKA, DM and GERD admitted with AMS from  SNF.  Cardiology consulted for atrial fibrillation with RVR.  Assessment & Plan    # Encephalopathy: # Hypoxic respiratory failure: On BiPAP.  Now s/p R thoracentesis on 9/8 with 2L transudative fluid removed. Effusion recurred and he now has bilateral chest tubes.  Eliquis on hold.  Resume when able.  Agree with Palliative Care discussions.   # Atrial fibrillation with RVR:  He has chronic atrial fibrillation.  Rates are now better controlled after increasing metoprolol.  He is on amiodarone chronically and it was continued.   # Hypotension: Albumin and midodrine.     We will follow from [place].  Rates are well-controlled.    For questions or updates, please contact [place] Please consult [email or web address] for contact info under        Signed, Skeet Latch, MD  06/23/2022, 12:42 PM

## 2022-06-23 NOTE — Progress Notes (Signed)
PT Cancellation Note  Patient Details Name: Darrell Horne MRN: [id number] DOB: 1946/04/13   Cancelled Treatment:    Reason Eval/Treat Not Completed: Patient not medically ready.  Pt on BiPAP and just received 2 chest tubes.  Will hold today. 06/23/2022  Ginger Carne., PT Acute Rehabilitation Services [phone]  (pager) [phone]  (office)   [name] 06/23/2022, 10:08 AM

## 2022-06-23 NOTE — Progress Notes (Signed)
Patient transferred safely to ICU with help from RT. All personal belongings were returned.

## 2022-06-24 ENCOUNTER — Inpatient Hospital Stay (HOSPITAL_COMMUNITY): Payer: Medicare Other

## 2022-06-24 DIAGNOSIS — I480 Paroxysmal atrial fibrillation: Secondary | ICD-10-CM | POA: Diagnosis not present

## 2022-06-24 DIAGNOSIS — J9601 Acute respiratory failure with hypoxia: Secondary | ICD-10-CM | POA: Diagnosis not present

## 2022-06-24 DIAGNOSIS — G9340 Encephalopathy, unspecified: Secondary | ICD-10-CM | POA: Diagnosis not present

## 2022-06-24 LAB — CBC WITH DIFFERENTIAL/PLATELET
Abs Immature Granulocytes: 0.05 10*3/uL (ref 0.00–0.07)
Basophils Absolute: 0 10*3/uL (ref 0.0–0.1)
Basophils Relative: 0 %
Eosinophils Absolute: 0.4 10*3/uL (ref 0.0–0.5)
Eosinophils Relative: 4 %
HCT: 30.8 % — ABNORMAL LOW (ref 39.0–52.0)
Hemoglobin: 10.4 g/dL — ABNORMAL LOW (ref 13.0–17.0)
Immature Granulocytes: 1 %
Lymphocytes Relative: 13 %
Lymphs Abs: 1.3 10*3/uL (ref 0.7–4.0)
MCH: 34 pg (ref 26.0–34.0)
MCHC: 33.8 g/dL (ref 30.0–36.0)
MCV: 100.7 fL — ABNORMAL HIGH (ref 80.0–100.0)
Monocytes Absolute: 1 10*3/uL (ref 0.1–1.0)
Monocytes Relative: 10 %
Neutro Abs: 7.1 10*3/uL (ref 1.7–7.7)
Neutrophils Relative %: 72 %
Platelets: 140 10*3/uL — ABNORMAL LOW (ref 150–400)
RBC: 3.06 MIL/uL — ABNORMAL LOW (ref 4.22–5.81)
RDW: 14 % (ref 11.5–15.5)
WBC: 9.9 10*3/uL (ref 4.0–10.5)
nRBC: 0 % (ref 0.0–0.2)

## 2022-06-24 LAB — POCT I-STAT 7, (LYTES, BLD GAS, ICA,H+H)
Acid-Base Excess: 6 mmol/L — ABNORMAL HIGH (ref 0.0–2.0)
Bicarbonate: 31.5 mmol/L — ABNORMAL HIGH (ref 20.0–28.0)
Calcium, Ion: 1.28 mmol/L (ref 1.15–1.40)
HCT: 31 % — ABNORMAL LOW (ref 39.0–52.0)
Hemoglobin: 10.5 g/dL — ABNORMAL LOW (ref 13.0–17.0)
O2 Saturation: 95 %
Potassium: 3 mmol/L — ABNORMAL LOW (ref 3.5–5.1)
Sodium: 143 mmol/L (ref 135–145)
TCO2: 33 mmol/L — ABNORMAL HIGH (ref 22–32)
pCO2 arterial: 47.8 mmHg (ref 32–48)
pH, Arterial: 7.427 (ref 7.35–7.45)
pO2, Arterial: 78 mmHg — ABNORMAL LOW (ref 83–108)

## 2022-06-24 LAB — GLUCOSE, CAPILLARY
Glucose-Capillary: 83 mg/dL (ref 70–99)
Glucose-Capillary: 152 mg/dL — ABNORMAL HIGH (ref 70–99)
Glucose-Capillary: 51 mg/dL — ABNORMAL LOW (ref 70–99)
Glucose-Capillary: 100 mg/dL — ABNORMAL HIGH (ref 70–99)
Glucose-Capillary: 75 mg/dL (ref 70–99)
Glucose-Capillary: 70 mg/dL (ref 70–99)
Glucose-Capillary: 89 mg/dL (ref 70–99)

## 2022-06-24 LAB — BASIC METABOLIC PANEL
Anion gap: 13 (ref 5–15)
Anion gap: 10 (ref 5–15)
BUN: 21 mg/dL (ref 8–23)
BUN: 22 mg/dL (ref 8–23)
CO2: 25 mmol/L (ref 22–32)
CO2: 32 mmol/L (ref 22–32)
Calcium: 9.1 mg/dL (ref 8.9–10.3)
Calcium: 9.1 mg/dL (ref 8.9–10.3)
Chloride: 103 mmol/L (ref 98–111)
Chloride: 100 mmol/L (ref 98–111)
Creatinine, Ser: 0.81 mg/dL (ref 0.61–1.24)
Creatinine, Ser: 0.76 mg/dL (ref 0.61–1.24)
GFR, Estimated: >60 mL/min (ref >60)
GFR, Estimated: >60 mL/min (ref >60)
Glucose, Bld: 81 mg/dL (ref 70–99)
Glucose, Bld: 119 mg/dL — ABNORMAL HIGH (ref 70–99)
Potassium: 3.5 mmol/L (ref 3.5–5.1)
Potassium: 2.8 mmol/L — ABNORMAL LOW (ref 3.5–5.1)
Sodium: 141 mmol/L (ref 135–145)
Sodium: 142 mmol/L (ref 135–145)

## 2022-06-24 LAB — AMMONIA: Ammonia: 25 umol/L (ref 9–35)

## 2022-06-24 MED ORDER — OSMOLITE 1.5 CAL PO LIQD
1000.0000 mL | ORAL | Status: DC
  Administered 2022-06-24: 1000 mL
  Filled 2022-06-24: qty 1000

## 2022-06-24 MED ORDER — ORAL CARE MOUTH RINSE: 15.0000 mL | OROMUCOSAL | Status: DC | PRN

## 2022-06-24 MED ORDER — DEXTROSE 50 % IV SOLN
25.0000 g | INTRAVENOUS | Status: AC
  Administered 2022-06-24: 25 g via INTRAVENOUS
  Filled 2022-06-24: qty 50

## 2022-06-24 MED ORDER — POTASSIUM CHLORIDE 10 MEQ/100ML IV SOLN
10.0000 meq | INTRAVENOUS | Status: AC
  Administered 2022-06-24 (×3): 10 meq via INTRAVENOUS
  Filled 2022-06-24 (×3): qty 100

## 2022-06-24 MED ORDER — FUROSEMIDE 10 MG/ML IJ SOLN
80.0000 mg | Freq: Two times a day (BID) | INTRAMUSCULAR | Status: DC
  Administered 2022-06-24 – 2022-06-26 (×5): 80 mg via INTRAVENOUS
  Filled 2022-06-24 (×5): qty 8

## 2022-06-24 MED ORDER — MAGNESIUM SULFATE 2 GM/50ML IV SOLN
2.0000 g | Freq: Once | INTRAVENOUS | Status: AC
  Administered 2022-06-24: 2 g via INTRAVENOUS
  Filled 2022-06-24: qty 50

## 2022-06-24 MED ORDER — POTASSIUM CHLORIDE 20 MEQ PO PACK
40.0000 meq | PACK | ORAL | Status: AC
  Administered 2022-06-24 (×3): 40 meq
  Filled 2022-06-24 (×3): qty 2

## 2022-06-24 MED ORDER — MIDODRINE HCL 5 MG PO TABS
5.0000 mg | ORAL_TABLET | Freq: Three times a day (TID) | ORAL | Status: DC
  Administered 2022-06-24 – 2022-06-29 (×16): 5 mg
  Filled 2022-06-24 (×16): qty 1

## 2022-06-24 MED ORDER — POTASSIUM CHLORIDE 10 MEQ/100ML IV SOLN
10.0000 meq | INTRAVENOUS | Status: AC
  Administered 2022-06-24 (×4): 10 meq via INTRAVENOUS
  Filled 2022-06-24 (×4): qty 100

## 2022-06-24 NOTE — Progress Notes (Signed)
IV watch alarmed with Red check. IV flushed and blood return noted. Will continue to monitor.

## 2022-06-24 NOTE — Progress Notes (Signed)
Evening rounds  Waxing waning mental status.  Was able to come off pressors this AM.  Keep 1 more night to assure continued hemodynamic and respiratory stability.  Erskine Emery MD PCCM

## 2022-06-24 NOTE — Progress Notes (Signed)
Physical Therapy Treatment Patient Details Name: Darrell Horne MRN: [id number] DOB: 1945/10/31 Today's Date: 06/24/2022   History of Present Illness Patient is a 76 y/o male who presents on 9/7 from [facility] for AMS. Admitted with acute encephalopathy and respiratory failure. CXR-pleural effusion s/p thoracentesis 9/8. Recent admission 8/25-9/1 for acute toxic encephalopathy-drug toxicity/polypharmacy overdosing. PMH includes chronic A-fib on Eliquis, DM, HTN, peripheral neuropathy, left BKA.    PT Comments    Pt is slowly progressing toward goals.  Emphasis on strengthening exercise for both LE's, transition to EOB, attempts at scooting, sitting balance and transfer to chair.    Recommendations for follow up therapy are one component of a multi-disciplinary discharge planning process, led by the attending physician.  Recommendations may be updated based on patient status, additional functional criteria and insurance authorization.  Follow Up Recommendations  Skilled nursing-short term rehab (<3 hours/day) Can patient physically be transported by private vehicle: No   Assistance Recommended at Discharge Frequent or constant Supervision/Assistance  Patient can return home with the following A lot of help with bathing/dressing/bathroom;Assistance with cooking/housework;Help with stairs or ramp for entrance;Two people to help with walking and/or transfers;Direct supervision/assist for medications management   Equipment Recommendations  None recommended by PT    Recommendations for Other Services       Precautions / Restrictions Precautions Precautions: Fall Precaution Comments: left BKA, no prosthesis in room (at [facility])     Mobility  Bed Mobility Overal bed mobility: Needs Assistance Bed Mobility: Supine to Sit     Supine to sit: Max assist, HOB elevated     General bed mobility comments: cues and maximal assist up via R elbow and padding to help pt scoot as he would not  shift weight.    Transfers Overall transfer level: Needs assistance   Transfers: Bed to chair/wheelchair/BSC       Squat pivot transfers: Max assist, +2 safety/equipment     General transfer comment: No prosthesis in room, squat pivot on R LE, cues for hand placement and UE assist.    Ambulation/Gait                   Stairs             Wheelchair Mobility    Modified Rankin (Stroke Patients Only)       Balance Overall balance assessment: Needs assistance Sitting-balance support: Single extremity supported, Feet supported Sitting balance-Leahy Scale: Poor Sitting balance - Comments: consistent lean to the Left with work on reaching forward and R to help shift his perception of midline to a more midline position.  Consistently needed maximal facilitation from L to R,.                                    Cognition Arousal/Alertness: Lethargic Behavior During Therapy: Flat affect Overall Cognitive Status: No family/caregiver present to determine baseline cognitive functioning                                          Exercises Amputee Exercises Hip ABduction/ADduction: AROM, AAROM, Both, 10 reps, Supine Hip Flexion/Marching: AROM, AAROM, Both, 10 reps, Supine (graded assist /resistance) Knee Flexion: AROM, Left, 10 reps Straight Leg Raises: AROM, AAROM, Both, 10 reps, Supine Other Exercises Other Exercises: bicep/tricep presses x10 reps bil with graded resistance  Other Exercises: reach to the ceiling x10 reps bil with aa/arom    General Comments        Pertinent Vitals/Pain Pain Assessment Pain Assessment: Faces Faces Pain Scale: No hurt Pain Intervention(s): Monitored during session    Home Living                          Prior Function            PT Goals (current goals can now be found in the care plan section) Acute Rehab PT Goals Patient Stated Goal: Wife reports plan for rehab with  transition to long term care as pt not able to manage at home PT Goal Formulation: With patient/family Time For Goal Achievement: 07/04/22 Potential to Achieve Goals: Fair Progress towards PT goals: Progressing toward goals    Frequency    Min 2X/week      PT Plan Current plan remains appropriate    Co-evaluation              AM-PAC PT "6 Clicks" Mobility   Outcome Measure  Help needed turning from your back to your side while in a flat bed without using bedrails?: A Lot Help needed moving from lying on your back to sitting on the side of a flat bed without using bedrails?: A Lot Help needed moving to and from a bed to a chair (including a wheelchair)?: Total Help needed standing up from a chair using your arms (e.g., wheelchair or bedside chair)?: Total Help needed to walk in hospital room?: Total Help needed climbing 3-5 steps with a railing? : Total 6 Click Score: 8    End of Session   Activity Tolerance: Patient tolerated treatment well Patient left: in chair;with call bell/phone within reach;with chair alarm set;with nursing/sitter in room Nurse Communication: Mobility status PT Visit Diagnosis: Other abnormalities of gait and mobility (R26.89);Muscle weakness (generalized) (M62.81)     Time: 3007-6226 PT Time Calculation (min) (ACUTE ONLY): 33 min  Charges:  $Therapeutic Exercise: 8-22 mins $Therapeutic Activity: 8-22 mins                     06/24/2022  Darrell Carne., PT Acute Rehabilitation Services [phone]  (office)   Darrell Horne 06/24/2022, 1:27 PM

## 2022-06-24 NOTE — Progress Notes (Signed)
NAME:  Darrell Horne, MRN:  [id number], DOB:  08-25-1946, LOS: 8 ADMISSION DATE:  06/16/2022, CONSULTATION DATE:  06/22/2022 REFERRING MD:  Dr. Louanne Belton, CHIEF COMPLAINT:  Acute Hypoxic Respiratory Failure, Pleural Effusion   History of Present Illness:  Darrell Horne is a 76 year old male with a past medical history of paroxysmal A-fib on Eliquis, left BKA, HTN, HLD, and alcoholic cirrhosis who presented to the hospital with altered mental status and was admitted on 06/16/2022.  He was found to have a large right-sided pleural effusion and his intermittently increased since August 26.  Thoracentesis on 06/17/2022 80 with 2 L of yellow fluid was found to be transudative pleural effusion.  Initially he did well after the procedure but over the past couple days has had increased oxygen requirements and episodes of hypotension.  Hypotension was treated with albumin and midodrine.  PCCM was consulted for further evaluation and management of his right-sided pleural effusion and acute hypoxic respiratory failure.  Pertinent  Medical History   Past Medical History:  Diagnosis Date   Chest pain    Diabetes mellitus    DM2 (diabetes mellitus, type 2) (HCC)    GERD (gastroesophageal reflux disease)    HLD (hyperlipidemia)    HTN (hypertension)    PAF (paroxysmal atrial fibrillation) ([place])    Palpitations      Significant Hospital Events: Including procedures, antibiotic start and stop dates in addition to other pertinent events   9/8 thoracentesis with return of 1.2 L of transudative fluid 9/12 increasing oxygen requirements with up to 6 L high flow nasal cannula and episodes of hypotension with systolic of 89.  Albumin, midodrine, and tube feeds started. 9/13 PCCM consulted, Eliquis held for planned thoracentesis tomorrow. CXR with stable pulm edema and pleural effusions 9/14 increased O2 requirements, stable on BiPAP now. Bilateral chest tubes placed with return of 6 L of yellow fluid. Off BiPAP 9/15  doing well off BiPAP overnight, on 5L [place], slowed drainage from bilat chest tubes, drastically improved peripheral edema  Interim History / Subjective:  Pt is doing much better this morning, mentation improved and peripheral edema is nearly resolved. No new or worsening complaints.   Objective   Blood pressure 107/75, pulse 98, temperature 97.9 F (36.6 C), temperature source Axillary, resp. rate 17, height '5\' 10"'$  (1.778 m), weight 90 kg, SpO2 97 %.        Intake/Output Summary (Last 24 hours) at 06/24/2022 0827 Last data filed at 06/24/2022 0800 Gross per 24 hour  Intake 1139.22 ml  Output 10320 ml  Net -9180.78 ml   Filed Weights   06/23/22 0352 06/23/22 0536 06/24/22 0500  Weight: 93.9 kg 91.3 kg 90 kg    Examination: General: elderly gentleman sitting in bed in NAD  HENT: [place]/AT Lungs: Diminished lung sounds right greater than left, interval improvement Cardiovascular: Irregularly irregular rhythm with a regular rate Abdomen: Soft and nontender Extremities: mild edema in the bilateral upper and lower extremities, stable left BKA Neuro: Alert and oriented to person and place Skin: Venous stasis dermatitis over RLE, dressed bilateral chest tubes in place  Resolved Hospital Problem list     Assessment & Plan:  Right-sided pleural effusion Acute hypoxic respiratory failure due to pulmonary edema Anasarca Poor protoplasm RV dysfunction Cirrhosis Despite 1 week of inpatient hospital care he had worsening volume status and respiratory status.  Required BiPAP.  His anasarca was exacerbated by hypoalbuminemia and severe protein calorie malnutrition as well as portal hypertension. He has signs of  RV dysfunction on TTE.  Suspect possible hospital-acquired pneumonia vs aspiration in setting of encephalopathy. Doing well after bilateral chest tube placement with return of yellow fluid, 4 L from right and 2 L from left.  -- Decrease Lasix 80 mg IV q8h to BID -- Continue supplemental O2  as needed -- Bilateral chest tubes placed to avoid intubation, monitor drainage -- Continue cefepime and linezolid, consider attempt to obtain lower respiratory culture if respiratory status worsens without signs of recurrent edema -- Repeat CXR today  Hypoalbuminemia/Malnutrition Debility, deconditioning Patient has been seen by dietitian and was put on cortrak tube tube after discussion with the patient's wife.  Nutrition following. Physical therapy has recommended skilled nursing facility at this time. Palliative care involved and we appreciate their assistance. - Continue TF and increase as tolerated - albumin gtt   Hypotension History of hypertension Beta blocker with holding parameters.  Received albumin infusion few times followed by Lasix.  Had to be started on midodrine but this has been discontinued. Required pressors in ICU - titrate Neo to off today as tolerated, may transfer out of ICU if stable off pressors   Chronic anemia  Latest hemoglobin of 11.5.  MCV elevated at 102.  Folic acid level at 34.  Vitamin B12 at 586.  Pending MMA.    Hypokalemia Monitor and replete as needed   Paroxysmal atrial fibrillation:  -CHA2DS2-VASc score of 4.  On beta-blocker amiodarone and Eliquis.   Keep beta-blocker with holding parameters due to borderline low blood pressure.   GERD -continue PPI   Hyperlipidemia:  -Continue statin.  Best Practice (right click and "Reselect all SmartList Selections" daily)   Diet/type: tubefeeds DVT prophylaxis: DOAC GI prophylaxis: PPI Lines: N/A Foley:  N/A Code Status:  full code Last date of multidisciplinary goals of care discussion [9/14] Dispo: Stable to transfer out of ICU if off pressors.  Labs   CBC: Recent Labs  Lab 06/18/22 0820 06/19/22 0202 06/20/22 0811 06/21/22 1120 06/22/22 0343 06/23/22 0509 06/24/22 0604  WBC 8.4   < > 4.8 4.8 9.2 10.2 9.9  NEUTROABS 5.8  --   --   --   --   --  7.1  HGB 12.3*   < > 10.1* 10.3*  11.5* 10.8* 10.4*  HCT 37.0*   < > 31.0* 30.8* 33.7* 33.0* 30.8*  MCV 102.8*   < > 102.0* 101.3* 101.5* 103.1* 100.7*  PLT 175   < > PLATELET CLUMPS NOTED ON SMEAR, COUNT APPEARS DECREASED 104* 169 125* 140*   < > = values in this interval not displayed.    Basic Metabolic Panel: Recent Labs  Lab 06/21/22 1120 06/21/22 1904 06/22/22 0343 06/22/22 1659 06/23/22 0509 06/23/22 1300 06/23/22 1639 06/24/22 0604  NA 138  --  139  --  140 141 144 141  K 3.4*  --  3.8  --  3.7 3.7 3.1* 3.5  CL 107  --  106  --  107 109 107 103  CO2 25  --  26  --  '27 28 29 25  '$ GLUCOSE 106*  --  130*  --  139* 124* 126* 81  BUN 12  --  12  --  '18 18 17 21  '$ CREATININE 0.62  --  0.64  --  0.65 0.74 0.70 0.81  CALCIUM 8.6*  --  8.6*  --  8.9 8.5* 8.9 9.1  MG 1.8 2.2 2.1 2.0 2.0  --   --   --   PHOS  --  3.5 3.2 3.0 2.7  --   --   --    GFR: Estimated Creatinine Clearance: 87.6 mL/min (by C-G formula based on SCr of 0.81 mg/dL). Recent Labs  Lab 06/21/22 1120 06/22/22 0343 06/23/22 0509 06/24/22 0604  WBC 4.8 9.2 10.2 9.9    Liver Function Tests: Recent Labs  Lab 06/18/22 0820 06/22/22 0343  AST 29 36  ALT 25 28  ALKPHOS 166* 195*  BILITOT 0.8 1.3*  PROT 5.3* 6.2*  ALBUMIN 1.8* 2.6*   No results for input(s): "LIPASE", "AMYLASE" in the last 168 hours. No results for input(s): "AMMONIA" in the last 168 hours.  ABG    Component Value Date/Time   PHART 7.4 06/22/2022 2115   PCO2ART 46 06/22/2022 2115   PO2ART 73 (L) 06/22/2022 2115   HCO3 27.9 06/22/2022 2115   TCO2 29 06/17/2022 0937   O2SAT 96.8 06/22/2022 2115     Coagulation Profile: No results for input(s): "INR", "PROTIME" in the last 168 hours.  Cardiac Enzymes: No results for input(s): "CKTOTAL", "CKMB", "CKMBINDEX", "TROPONINI" in the last 168 hours.  HbA1C: Hgb A1c MFr Bld  Date/Time Value Ref Range Status  04/19/2022 10:39 AM 4.8 <5.7 % of total Hgb Final    Comment:    For the purpose of screening for the  presence of diabetes: . <5.7%       Consistent with the absence of diabetes 5.7-6.4%    Consistent with increased risk for diabetes             (prediabetes) > or =6.5%  Consistent with diabetes . This assay result is consistent with a decreased risk of diabetes. . Currently, no consensus exists regarding use of hemoglobin A1c for diagnosis of diabetes in children. . According to American Diabetes Association (ADA) guidelines, hemoglobin A1c <7.0% represents optimal control in non-pregnant diabetic patients. Different metrics may apply to specific patient populations.  Standards of Medical Care in Diabetes(ADA). Marland Kitchen   03/24/2021 10:52 AM 5.6 <5.7 % of total Hgb Final    Comment:    For the purpose of screening for the presence of diabetes: . <5.7%       Consistent with the absence of diabetes 5.7-6.4%    Consistent with increased risk for diabetes             (prediabetes) > or =6.5%  Consistent with diabetes . This assay result is consistent with a decreased risk of diabetes. . Currently, no consensus exists regarding use of hemoglobin A1c for diagnosis of diabetes in children. . According to American Diabetes Association (ADA) guidelines, hemoglobin A1c <7.0% represents optimal control in non-pregnant diabetic patients. Different metrics may apply to specific patient populations.  Standards of Medical Care in Diabetes(ADA). .     CBG: Recent Labs  Lab 06/23/22 1933 06/23/22 2336 06/24/22 0318 06/24/22 0739 06/24/22 0802  GLUCAP 96 88 83 51* 152*    Review of Systems:   No dyspnea, no pain.   Past Medical History:  He,  has a past medical history of Chest pain, Diabetes mellitus, DM2 (diabetes mellitus, type 2) ([place]), GERD (gastroesophageal reflux disease), HLD (hyperlipidemia), HTN (hypertension), PAF (paroxysmal atrial fibrillation) ([place]), and Palpitations.   Surgical History:   Past Surgical History:  Procedure Laterality Date   APPENDECTOMY      CHOLECYSTECTOMY     COLONOSCOPY     Dr. Michail Sermon   COLONOSCOPY WITH PROPOFOL N/A 11/06/2018   Procedure: COLONOSCOPY WITH PROPOFOL;  Surgeon: Wilford Corner, MD;  Location: [facility];  Service: Endoscopy;  Laterality: N/A;   HERNIA REPAIR     IR THORACENTESIS ASP PLEURAL SPACE W/IMG GUIDE  06/17/2022   LEG AMPUTATION Left [id number]   Dr. Kendell Bane   POLYPECTOMY  11/06/2018   Procedure: POLYPECTOMY;  Surgeon: Wilford Corner, MD;  Location: [facility] ENDOSCOPY;  Service: Endoscopy;;   SHOULDER SURGERY  [id number]   Dr. Meridee Score   TOTAL ANKLE ARTHROPLASTY  [id number]     Social History:   reports that he quit smoking about 40 years ago. His smoking use included cigarettes. He has never used smokeless tobacco. He reports current alcohol use. He reports that he does not use drugs.   Family History:  His family history includes Cancer in his sister; Diabetes in his sister; Heart disease in his mother; Stroke (age of onset: 36) in his father.   Allergies No Known Allergies   Home Medications  Prior to Admission medications   Medication Sig Start Date End Date Taking? Authorizing Provider  acetaminophen (TYLENOL) 500 MG tablet Take 1 tablet (500 mg total) by mouth every 6 (six) hours as needed for moderate pain. 05/27/22  Yes Ngetich, Dinah C, NP  acetaminophen (TYLENOL) 650 MG CR tablet Take 650 mg by mouth See admin instructions. Give 1 tablet (650 mg) every 4 to 6 hours as needed for pain or oral temp >100, rectal temp >102 **DO NOT GIVE MORE THAN 3,000 MG (Combined) IN 24 HOURS**   Yes [provider]  alum & mag hydroxide-simeth (MAALOX PLUS) 400-400-40 MG/5ML suspension Take 30 mLs by mouth 3 (three) times daily as needed for indigestion (Not to exceed 3 doses daily. Inform MD if used more than 3 days.). **Do not give at same time as antibiotics, Digoxin or Coumadin**   Yes [provider]  Amino Acids-Protein Hydrolys (PRO-STAT AWC) LIQD Take 30 mLs by mouth in the  morning and at bedtime.   Yes [provider]  amiodarone (PACERONE) 200 MG tablet Take 1 tablet (200 mg total) by mouth 2 (two) times daily. 05/10/22  Yes Jerline Pain, MD  amitriptyline (ELAVIL) 50 MG tablet TAKE 1 TABLET BY MOUTH EVERYDAY AT BEDTIME Patient taking differently: Take 50 mg by mouth at bedtime. 02/11/21  Yes Ngetich, Dinah C, NP  apixaban (ELIQUIS) 5 MG TABS tablet Take 1 tablet (5 mg total) by mouth 2 (two) times daily. 05/20/21  Yes Ngetich, Dinah C, NP  atorvastatin (LIPITOR) 40 MG tablet Take 1 tablet (40 mg total) by mouth daily. 08/07/20  Yes Ngetich, Dinah C, NP  bisacodyl (DULCOLAX) 10 MG suppository Place 10 mg rectally as needed for moderate constipation (If no bowel movement within 72 hours or Milk of Magnesia, May give 1 suppository rectally (or 1 tablet) as needed for constipation and inform MD.).   Yes [provider]  bisacodyl (DULCOLAX) 5 MG EC tablet Take 5 mg by mouth as needed for moderate constipation (If no bowel movement within 72 hours or Milk of Magnesia, May give 1 tablet by mouth (or 1 suppository) as needed for constipation and inform MD).   Yes [provider]  folic acid (FOLVITE) 1 MG tablet TAKE 1 TABLET BY MOUTH EVERY DAY Patient taking differently: Take 1 mg by mouth daily. 06/03/21  Yes Ngetich, Dinah C, NP  gabapentin (NEURONTIN) 600 MG tablet Take 0.5 tablets (300 mg total) by mouth 3 (three) times daily. 06/10/22  Yes Ghimire, Henreitta Leber, MD  ibuprofen (ADVIL) 200 MG tablet Take 200 mg by mouth  every 6 (six) hours as needed for fever, mild pain or moderate pain (For pain, oral temp >100, rectal temp >102 (or acetaminophen 650 mg by mouth every 4 hours)). **DO NOT GIVE MORE THAN 3,000 mg (Combined) IN 24 HOURS**   Yes [provider]  ketoconazole (NIZORAL) 2 % cream Apply 1 fingertip amount to each foot daily. 05/21/21  Yes Evelina Bucy, DPM  lidocaine (LIDODERM) 5 % Place 1 patch onto the skin daily as needed (For  back pain). 06/10/22  Yes [provider]  loperamide (IMODIUM A-D) 2 MG tablet Take 2-4 mg by mouth as needed for diarrhea or loose stools (Give 2 tablets (4 mg) at onset of diarrhea, then 1 tablet (2 mg) after each stool. (Not to exceed 16 mg total) If diarrhea persists for 36 hours, notify MD.).   Yes [provider]  magnesium hydroxide (MILK OF MAGNESIA) 400 MG/5ML suspension Take 30 mLs by mouth 2 (two) times daily as needed for mild constipation or moderate constipation (*Use Miralax if Milk of Magnesia IS CONTRANDICATED (example: Dialysis resident)*).   Yes [provider]  meclizine (DRAMAMINE) 25 MG tablet Take 25 mg by mouth as needed for nausea (May repeat in 1 hour if ineffective. If nausea persists after second dose, call MD.).   Yes [provider]  metoprolol succinate (TOPROL-XL) 25 MG 24 hr tablet Take 1 tablet (25 mg total) by mouth daily. Take with or immediately following a meal. Patient taking differently: Take 12.5 mg by mouth daily. Take with or immediately following a meal. 06/10/22  Yes Ghimire, Henreitta Leber, MD  nystatin cream (MYCOSTATIN) Apply 1 Application topically 2 (two) times daily as needed (For cutaneous or mucocutaneous candida rash of abdomen folds, groin and under breasts for 21 days.). D/C order if rash is healed after 21 days. **Update MD if rash not resolved at that time**   Yes [provider]  omeprazole (PRILOSEC) 20 MG capsule TAKE 1 CAPSULE BY MOUTH EVERY DAY Patient taking differently: Take 20 mg by mouth daily. 01/28/21  Yes Ngetich, Dinah C, NP  Phenylephrine-DM-GG (ROBITUSSIN CHILD COUGH/COLD CF) 2.02-11-49 MG/5ML LIQD Take 10 mLs by mouth every 4 (four) hours as needed (For cough *Notify MD if cough persists, congestion x3 days or accompanied by fever*).   Yes [provider]  polyethylene glycol (MIRALAX / GLYCOLAX) 17 g packet Take 17 g by mouth daily as needed for mild constipation or moderate constipation  (*Use Miralax if Milk of Magnesia IS CONTRAINDICATED (Example: Dialysis resident)*).   Yes [provider]  promethazine (PHENERGAN) 25 MG tablet Take 25 mg by mouth every 4 (four) hours as needed for nausea or vomiting (May use suppository if unable to take by mouth. Notify MD if nausea persists after second dose.).   Yes [provider]  silver sulfADIAZINE (SILVADENE) 1 % cream Apply 1 Application topically daily. 06/15/22  Yes [provider]  topiramate (TOPAMAX) 50 MG tablet TAKE 1 AND 1/2 TABLETS BY MOUTH DAILY Patient taking differently: Take 75 mg by mouth daily. 02/26/21  Yes Ngetich, Dinah C, NP  vitamin B-12 (CYANOCOBALAMIN) 1000 MCG tablet Take 1,000 mcg by mouth daily.   Yes [provider]  vitamin C (ASCORBIC ACID) 500 MG tablet Take 500 mg by mouth daily.    Yes [provider]     Critical care time: 38

## 2022-06-24 NOTE — Progress Notes (Addendum)
Rounding Note    Patient Name: Darrell Horne Date of Encounter: 06/24/2022  [place] Cardiologist: None   Subjective   Unable to assess.  Patient sleeping and not awakening to answer questions.   Inpatient Medications    Scheduled Meds:  amiodarone  200 mg Per Tube BID   amitriptyline  50 mg Per Tube QHS   apixaban  5 mg Per Tube BID   atorvastatin  40 mg Per Tube Daily   Chlorhexidine Gluconate Cloth  6 each Topical Daily   feeding supplement (PROSource TF20)  60 mL Per Tube Daily   folic acid  1 mg Per Tube Daily   free water  50 mL Per Tube Q4H   furosemide  80 mg Intravenous BID   linezolid  600 mg Per Tube Q12H   metoprolol tartrate  25 mg Per Tube BID   mupirocin cream   Topical Daily   polyethylene glycol  17 g Per Tube Daily   topiramate  75 mg Per Tube Daily   Continuous Infusions:  sodium chloride 10 mL/hr at 06/24/22 0600   ceFEPime (MAXIPIME) IV 2 g (06/24/22 0644)   feeding supplement (OSMOLITE 1.5 CAL) Stopped (06/23/22 0430)   magnesium sulfate bolus IVPB     phenylephrine (NEO-SYNEPHRINE) Adult infusion 20 mcg/min (06/24/22 0600)   potassium chloride 10 mEq (06/24/22 0924)   PRN Meds: acetaminophen **OR** acetaminophen, fentaNYL (SUBLIMAZE) injection, lidocaine (PF), mouth rinse   Vital Signs    Vitals:   06/24/22 0630 06/24/22 0645 06/24/22 0737 06/24/22 0800  BP: (!) 97/56 104/78  107/75  Pulse: 99 95  98  Resp: 19 (!) 21  17  Temp:   97.9 F (36.6 C)   TempSrc:   Axillary   SpO2: 91% 92%  97%  Weight:      Height:        Intake/Output Summary (Last 24 hours) at 06/24/2022 0938 Last data filed at 06/24/2022 0800 Gross per 24 hour  Intake 1181.8 ml  Output 10320 ml  Net -9138.2 ml      06/24/2022    5:00 AM 06/23/2022    5:36 AM 06/23/2022    3:52 AM  Last 3 Weights  Weight (lbs) 198 lb 6.6 oz 201 lb 4.5 oz 207 lb 0.2 oz  Weight (kg) 90 kg 91.3 kg 93.9 kg      Telemetry    Atrial fibrillation.  Rate 90s-100s -  Personally Reviewed  ECG    N/a - Personally Reviewed  Physical Exam   VS:  BP 107/75   Pulse 98   Temp 97.9 F (36.6 C) (Axillary)   Resp 17   Ht '5\' 10"'$  (1.778 m)   Wt 90 kg   SpO2 97%   BMI 28.47 kg/m  , BMI Body mass index is 28.47 kg/m. GENERAL:  Ill-appearing.  On BiPAP HEENT: Pupils equal round and reactive, fundi not visualized, oral mucosa unremarkable NECK:  No jugular venous distention, waveform within normal limits, carotid upstroke brisk and symmetric, no bruits, no thyromegaly LUNGS:  Clear to auscultation bilaterally on anterior exam HEART:  Irregularly irregular.  PMI not displaced or sustained,S1 and S2 within normal limits, no S3, no S4, no clicks, no rubs, no murmurs ABD:  Flat, positive bowel sounds normal in frequency in pitch, no bruits, no rebound, no guarding, no midline pulsatile mass, no hepatomegaly, no splenomegaly EXT:  L BKA.  1+ R LE edema SKIN:  No rashes no nodules NEURO: Moves all  4 extremities PSYCH:  Unable to assess   Labs    High Sensitivity Troponin:  No results for input(s): "TROPONINIHS" in the last 720 hours.   Chemistry Recent Labs  Lab 06/18/22 0820 06/19/22 0202 06/22/22 0343 06/22/22 1659 06/23/22 0509 06/23/22 1300 06/23/22 1639 06/24/22 0604  NA 137   < > 139  --  140 141 144 141  K 4.0   < > 3.8  --  3.7 3.7 3.1* 3.5  CL 110   < > 106  --  107 109 107 103  CO2 22   < > 26  --  '27 28 29 25  '$ GLUCOSE 83   < > 130*  --  139* 124* 126* 81  BUN 12   < > 12  --  '18 18 17 21  '$ CREATININE 0.61   < > 0.64  --  0.65 0.74 0.70 0.81  CALCIUM 8.1*   < > 8.6*  --  8.9 8.5* 8.9 9.1  MG  --    < > 2.1 2.0 2.0  --   --   --   PROT 5.3*  --  6.2*  --   --   --   --   --   ALBUMIN 1.8*  --  2.6*  --   --   --   --   --   AST 29  --  36  --   --   --   --   --   ALT 25  --  28  --   --   --   --   --   ALKPHOS 166*  --  195*  --   --   --   --   --   BILITOT 0.8  --  1.3*  --   --   --   --   --   GFRNONAA >60   < > >60  --  >60  >60 >60 >60  ANIONGAP 5   < > 7  --  6 4* 8 13   < > = values in this interval not displayed.    Lipids No results for input(s): "CHOL", "TRIG", "HDL", "LABVLDL", "LDLCALC", "CHOLHDL" in the last 168 hours.  Hematology Recent Labs  Lab 06/22/22 0343 06/23/22 0509 06/24/22 0604  WBC 9.2 10.2 9.9  RBC 3.32* 3.20* 3.06*  HGB 11.5* 10.8* 10.4*  HCT 33.7* 33.0* 30.8*  MCV 101.5* 103.1* 100.7*  MCH 34.6* 33.8 34.0  MCHC 34.1 32.7 33.8  RDW 13.8 14.0 14.0  PLT 169 125* 140*   Thyroid  No results for input(s): "TSH", "FREET4" in the last 168 hours.   BNP Recent Labs  Lab 06/19/22 0202  BNP 298.9*    DDimer No results for input(s): "DDIMER" in the last 168 hours.   Radiology    DG CHEST PORT 1 VIEW  Result Date: 06/24/2022 CLINICAL DATA:  Respiratory failure and bilateral indwelling chest tubes. EXAM: PORTABLE CHEST 1 VIEW COMPARISON:  06/23/2022 FINDINGS: Stable heart size. Stable positioning of bilateral basilar pigtail thoracostomy tubes. No significant residual pleural fluid with probable small amount basilar pleural fluid bilaterally. Persistent small right apical pneumothorax of less than 10% volume. Increased pulmonary opacities felt to represent interstitial edema bilaterally. Feeding tube extends into the stomach. IMPRESSION: 1. Persistent small right apical pneumothorax of less than 10% volume. 2. Bibasilar chest tubes remain with probable small amount of bilateral pleural fluid. 3. Increase in pulmonary interstitial edema. Electronically Signed  By: Aletta Edouard M.D.   On: 06/24/2022 08:32   DG CHEST PORT 1 VIEW  Result Date: 06/23/2022 CLINICAL DATA:  Chest tube placement EXAM: PORTABLE CHEST 1 VIEW COMPARISON:  06/22/2022 FINDINGS: New chest tubes bilaterally at the bases. Partially imaged enteric tube. Decreased pleural effusions with improved lung aeration. New lucency at the right apex likely reflecting a small pneumothorax. No definite left pneumothorax. Persistent  bilateral pulmonary opacities. Stable cardiomediastinal contours. IMPRESSION: New bilateral chest tubes with small pneumothorax at the right apex. Decreased pleural effusions with improved basilar atelectasis. Otherwise similar bilateral pulmonary opacities. These results will be called to the ordering clinician or representative by the Radiologist Assistant, and communication documented in the PACS or [employer]. Electronically Signed   By: Macy Mis M.D.   On: 06/23/2022 10:12   DG CHEST PORT 1 VIEW  Result Date: 06/22/2022 CLINICAL DATA:  Shortness of breath. EXAM: PORTABLE CHEST 1 VIEW COMPARISON:  Chest x-ray 06/22/2022 FINDINGS: Enteric tube extends below the diaphragm. Moderate right and small left pleural effusions persist. Central interstitial and airspace opacities appear unchanged. No evidence for pneumothorax. The cardiomediastinal silhouette is stable and within normal limits. No acute fractures are seen. IMPRESSION: 1. Stable bilateral pulmonary infiltrates concerning for pulmonary edema. Pneumonia not excluded. 2. Stable pleural effusions. Electronically Signed   By: Ronney Asters M.D.   On: 06/22/2022 21:20   DG CHEST PORT 1 VIEW  Result Date: 06/22/2022 CLINICAL DATA:  Dyspnea. EXAM: PORTABLE CHEST 1 VIEW COMPARISON:  Chest x-ray from yesterday. FINDINGS: New feeding tube entering the stomach with the tip below the field of view. Stable cardiomediastinal silhouette. Diffuse bilateral airspace disease and interstitial thickening remains worse on the right, but has progressed on the left compared to prior. Unchanged moderate right and small left pleural effusions. No pneumothorax. No acute osseous abnormality. IMPRESSION: 1. Worsening pulmonary edema and/or pneumonia. 2. Unchanged moderate right and small left pleural effusions. 3. New feeding tube entering the stomach with the tip below the field of view. Electronically Signed   By: Titus Dubin M.D.   On: 06/22/2022 11:42     Cardiac Studies   Echo 06/20/22:  1. Left ventricular ejection fraction, by estimation, is 55 to 60%. The  left ventricle has normal function. The left ventricle has no regional  wall motion abnormalities. Left ventricular diastolic parameters are  indeterminate.   2. Right ventricular systolic function is low normal. The right  ventricular size is normal. There is mildly elevated pulmonary artery  systolic pressure. The estimated right ventricular systolic pressure is  16.1 mmHg.   3. Large pleural effusion in the left lateral region.   4. The mitral valve is grossly normal. Mild to moderate mitral valve  regurgitation.   5. The aortic valve was not well visualized. Aortic valve regurgitation  is trivial. No aortic stenosis is present.   Patient Profile     76 y.o. male with HTN, LH, L BKA, DM and GERD admitted with AMS from SNF.  Cardiology consulted for atrial fibrillation with RVR.  Assessment & Plan    # Encephalopathy: # Hypoxic respiratory failure: On BiPAP initially and now transitioned to 5L [place].  Now s/p R thoracentesis on 9/8 with 2L transudative fluid removed. Effusion recurred and he now has bilateral chest tubes.  5.1L out of chest tubes yesterday.  He is also diuresing wel.  He had 4.6L UOP and renal function is stable.  CXR today shows small R apical pneumothorax, very small effusion  and increased pulmonary edema. Eliquis on hold.  Resume when able.  Agree with Palliative Care discussions.   # Atrial fibrillation with RVR:  He has chronic atrial fibrillation.  Rates are now better controlled after increasing metoprolol.  He is on amiodarone chronically and it was continued.   # Hypotension: Albumin and midodrine.  Now also requiring phenylephrine in the setting of large volume diuresis.   .    For questions or updates, please contact [place] Please consult [email or web address] for contact info under        Signed, Skeet Latch, MD  06/24/2022, 9:38  AM

## 2022-06-24 NOTE — Progress Notes (Signed)
Palliative:  Went to bedside - patient off bipap on nasal cannula. Per chart review, likely to move out of ICU soon. No family at bedside. Received update from RN. Attempted to call wife - no answer.  PMT will continue to follow.  Juel Burrow, DNP, AGNP-C Palliative Medicine Team Team Phone # [phone]  Pager # [phone]  NO CHARGE

## 2022-06-25 DIAGNOSIS — I4819 Other persistent atrial fibrillation: Secondary | ICD-10-CM

## 2022-06-25 DIAGNOSIS — J90 Pleural effusion, not elsewhere classified: Secondary | ICD-10-CM

## 2022-06-25 DIAGNOSIS — G9340 Encephalopathy, unspecified: Secondary | ICD-10-CM | POA: Diagnosis not present

## 2022-06-25 LAB — BASIC METABOLIC PANEL
Anion gap: 11 (ref 5–15)
BUN: 22 mg/dL (ref 8–23)
CO2: 31 mmol/L (ref 22–32)
Calcium: 9 mg/dL (ref 8.9–10.3)
Chloride: 101 mmol/L (ref 98–111)
Creatinine, Ser: 0.85 mg/dL (ref 0.61–1.24)
GFR, Estimated: >60 mL/min (ref >60)
Glucose, Bld: 121 mg/dL — ABNORMAL HIGH (ref 70–99)
Potassium: 3.8 mmol/L (ref 3.5–5.1)
Sodium: 143 mmol/L (ref 135–145)

## 2022-06-25 LAB — CBC
HCT: 31.8 % — ABNORMAL LOW (ref 39.0–52.0)
Hemoglobin: 10.8 g/dL — ABNORMAL LOW (ref 13.0–17.0)
MCH: 34.2 pg — ABNORMAL HIGH (ref 26.0–34.0)
MCHC: 34 g/dL (ref 30.0–36.0)
MCV: 100.6 fL — ABNORMAL HIGH (ref 80.0–100.0)
Platelets: DECREASED 10*3/uL (ref 150–400)
RBC: 3.16 MIL/uL — ABNORMAL LOW (ref 4.22–5.81)
RDW: 13.8 % (ref 11.5–15.5)
WBC: 6.4 10*3/uL (ref 4.0–10.5)
nRBC: 0 % (ref 0.0–0.2)

## 2022-06-25 LAB — GLUCOSE, CAPILLARY
Glucose-Capillary: 104 mg/dL — ABNORMAL HIGH (ref 70–99)
Glucose-Capillary: 107 mg/dL — ABNORMAL HIGH (ref 70–99)
Glucose-Capillary: 112 mg/dL — ABNORMAL HIGH (ref 70–99)
Glucose-Capillary: 58 mg/dL — ABNORMAL LOW (ref 70–99)
Glucose-Capillary: 71 mg/dL (ref 70–99)
Glucose-Capillary: 82 mg/dL (ref 70–99)
Glucose-Capillary: 93 mg/dL (ref 70–99)

## 2022-06-25 LAB — FOLATE: Folate: 14.3 ng/mL (ref >5.9)

## 2022-06-25 LAB — MAGNESIUM: Magnesium: 2.2 mg/dL (ref 1.7–2.4)

## 2022-06-25 MED ORDER — SPIRONOLACTONE 100 MG PO TABS
100.0000 mg | ORAL_TABLET | Freq: Every day | ORAL | Status: DC
  Filled 2022-06-25: qty 1

## 2022-06-25 MED ORDER — THIAMINE MONONITRATE 100 MG PO TABS
100.0000 mg | ORAL_TABLET | Freq: Every day | ORAL | Status: DC
  Administered 2022-06-25 – 2022-06-29 (×5): 100 mg
  Filled 2022-06-25 (×5): qty 1

## 2022-06-25 MED ORDER — OSMOLITE 1.5 CAL PO LIQD
1000.0000 mL | ORAL | Status: DC
  Filled 2022-06-25: qty 1000

## 2022-06-25 MED ORDER — ADULT MULTIVITAMIN LIQUID CH: 15.0000 mL | Freq: Every day | ORAL | Status: DC

## 2022-06-25 MED ORDER — ADULT MULTIVITAMIN W/MINERALS CH: 1.0000 | ORAL_TABLET | Freq: Every day | ORAL | Status: DC

## 2022-06-25 MED ORDER — POTASSIUM CHLORIDE 20 MEQ PO PACK
40.0000 meq | PACK | Freq: Once | ORAL | Status: AC
  Administered 2022-06-25: 40 meq
  Filled 2022-06-25: qty 2

## 2022-06-25 MED ORDER — LACTULOSE 10 GM/15ML PO SOLN
20.0000 g | Freq: Every day | ORAL | Status: DC
  Administered 2022-06-25 – 2022-06-26 (×2): 20 g
  Filled 2022-06-25 (×2): qty 30

## 2022-06-25 MED ORDER — OSMOLITE 1.5 CAL PO LIQD
1000.0000 mL | ORAL | Status: DC
  Administered 2022-06-26 – 2022-07-03 (×8): 1000 mL
  Filled 2022-06-25 (×8): qty 1000

## 2022-06-25 MED ORDER — ADULT MULTIVITAMIN W/MINERALS CH
1.0000 | ORAL_TABLET | Freq: Every day | ORAL | Status: DC
  Administered 2022-06-25 – 2022-07-12 (×18): 1
  Filled 2022-06-25 (×18): qty 1

## 2022-06-25 MED ORDER — SODIUM CHLORIDE 0.9 % IV SOLN
1.0000 g | INTRAVENOUS | Status: DC
  Administered 2022-06-25 – 2022-06-28 (×4): 1 g via INTRAVENOUS
  Filled 2022-06-25 (×5): qty 10

## 2022-06-25 MED ORDER — ZINC SULFATE 220 (50 ZN) MG PO CAPS
220.0000 mg | ORAL_CAPSULE | Freq: Every day | ORAL | Status: DC
  Administered 2022-06-25 – 2022-07-03 (×9): 220 mg
  Filled 2022-06-25 (×9): qty 1

## 2022-06-25 MED ORDER — SPIRONOLACTONE 25 MG PO TABS
100.0000 mg | ORAL_TABLET | Freq: Every day | ORAL | Status: DC
  Administered 2022-06-26 – 2022-07-12 (×17): 100 mg
  Filled 2022-06-25 (×2): qty 4
  Filled 2022-06-25: qty 1
  Filled 2022-06-25 (×15): qty 4

## 2022-06-25 MED ORDER — METOPROLOL TARTRATE 12.5 MG HALF TABLET
12.5000 mg | ORAL_TABLET | Freq: Two times a day (BID) | ORAL | Status: DC
  Administered 2022-06-25 – 2022-07-03 (×8): 12.5 mg
  Filled 2022-06-25 (×17): qty 1

## 2022-06-25 MED ORDER — LACTULOSE 10 GM/15ML PO SOLN: 20.0000 g | Freq: Every day | ORAL | Status: DC

## 2022-06-25 NOTE — Plan of Care (Signed)

## 2022-06-25 NOTE — Progress Notes (Addendum)
NAME:  Darrell Horne, MRN:  [id number], DOB:  Jan 04, 1946, LOS: 9 ADMISSION DATE:  06/16/2022, CONSULTATION DATE:  06/22/2022 REFERRING MD:  Dr. Louanne Belton, CHIEF COMPLAINT:  Acute Hypoxic Respiratory Failure, Pleural Effusion   History of Present Illness:  Darrell Horne is a 76 year old male with a past medical history of paroxysmal A-fib on Eliquis, left BKA, HTN, HLD, and alcoholic cirrhosis who presented to the hospital with altered mental status and was admitted on 06/16/2022.  He was found to have a large right-sided pleural effusion and his intermittently increased since August 26.  Thoracentesis on 06/17/2022 81 with 2 L of yellow fluid was found to be transudative pleural effusion.  Initially he did well after the procedure but over the past couple days has had increased oxygen requirements and episodes of hypotension.  Hypotension was treated with albumin and midodrine.  PCCM was consulted for further evaluation and management of his right-sided pleural effusion and acute hypoxic respiratory failure.  Pertinent  Medical History   Past Medical History:  Diagnosis Date   Chest pain    Diabetes mellitus    DM2 (diabetes mellitus, type 2) (HCC)    GERD (gastroesophageal reflux disease)    HLD (hyperlipidemia)    HTN (hypertension)    PAF (paroxysmal atrial fibrillation) ([place])    Palpitations      Significant Hospital Events: Including procedures, antibiotic start and stop dates in addition to other pertinent events   9/8 thoracentesis with return of 1.2 L of transudative fluid 9/12 increasing oxygen requirements with up to 6 L high flow nasal cannula and episodes of hypotension with systolic of 89.  Albumin, midodrine, and tube feeds started. 9/13 PCCM consulted, Eliquis held for planned thoracentesis tomorrow. CXR with stable pulm edema and pleural effusions 9/14 increased O2 requirements, stable on BiPAP now. Bilateral chest tubes placed with return of 6 L of yellow fluid. Off BiPAP 9/15  doing well off BiPAP overnight, on 5L [place], slowed drainage from bilat chest tubes, drastically improved peripheral edema.  9/16 off bipap. Remains off pressors. Stable to transfer out of ICU   Interim History / Subjective:  NAEO  On RA this morning   BMP grossly WNL   Objective   Blood pressure 107/75, pulse (!) 116, temperature 97.8 F (36.6 C), temperature source Axillary, resp. rate 16, height '5\' 10"'$  (1.778 m), weight 90 kg, SpO2 95 %.        Intake/Output Summary (Last 24 hours) at 06/25/2022 0751 Last data filed at 06/25/2022 0500 Gross per 24 hour  Intake 1918.17 ml  Output 5270 ml  Net -3351.83 ml   Filed Weights   06/23/22 0352 06/23/22 0536 06/24/22 0500  Weight: 93.9 kg 91.3 kg 90 kg    Examination: General: elderly M NAD  HENT: NCAT. Cortrak in place  Lungs: Diminished basilar sounds. Chest tubes Cardiovascular: irregular rhythm. Cap refill < 3 sec  Abdomen: soft ndnt  Extremities: L BKA Neuro: Drowsy. Awakens to tactile stimulation. Answering questions, falls asleep easily  Skin:  venous stasis dermatitis   Resolved Hospital Problem list     Assessment & Plan:    Acute metabolic encephalopathy Possible hypoactive ICU delirium P -delirium precautions -tx metabolic abnormalities as below   Acute respiratory failure with hypoxia  Bilateral pleural effusion R apical ptx, small Pulmonary edema  P -Supplemental O2 for goal > 92 -'80mg'$  lasix BID  -routine chest tube care -change abx to rocephin  Paroxysmal Afib, rate controlled  RV dysfunction  -CHA2DS2-VASc score  of 4.   P -eliquis  -amio -Lasix -add spiro -decr metop from 25 mg to 12.5 mg, with hold parameters   Portal Hypertension Cirrhosis  P -PRN lfts  -add lactulose  -rocephin   Hypotension, improved Hx HTN  P -MAP goal > 65    Chronic anemia -PRN CBC   GERD -PPI   Hyperlipidemia -statin.  Malnutrition Debility Deconditioning  Patient has been seen by dietitian and  was put on cortrak tube tube after discussion with the patient's wife.  Nutrition following. Physical therapy has recommended skilled nursing facility at this time. Palliative care involved and we appreciate their assistance. - Continue TF and increase as tolerated - albumin gtt   Best Practice (right click and "Reselect all SmartList Selections" daily)   Diet/type: tubefeeds DVT prophylaxis: DOAC GI prophylaxis: PPI Lines: N/A Foley:  N/A Code Status:  full code Last date of multidisciplinary goals of care discussion [9/14] Dispo: Consider transfer out of ICU   Labs   CBC: Recent Labs  Lab 06/18/22 0820 06/19/22 0202 06/20/22 0811 06/21/22 1120 06/22/22 0343 06/23/22 0509 06/24/22 0604 06/24/22 1128  WBC 8.4   < > 4.8 4.8 9.2 10.2 9.9  --   NEUTROABS 5.8  --   --   --   --   --  7.1  --   HGB 12.3*   < > 10.1* 10.3* 11.5* 10.8* 10.4* 10.5*  HCT 37.0*   < > 31.0* 30.8* 33.7* 33.0* 30.8* 31.0*  MCV 102.8*   < > 102.0* 101.3* 101.5* 103.1* 100.7*  --   PLT 175   < > PLATELET CLUMPS NOTED ON SMEAR, COUNT APPEARS DECREASED 104* 169 125* 140*  --    < > = values in this interval not displayed.    Basic Metabolic Panel: Recent Labs  Lab 06/21/22 1904 06/22/22 0343 06/22/22 1659 06/23/22 0509 06/23/22 1300 06/23/22 1639 06/24/22 0604 06/24/22 1128 06/24/22 1646 06/25/22 0621  NA  --  139  --  140 141 144 141 143 142 143  K  --  3.8  --  3.7 3.7 3.1* 3.5 3.0* 2.8* 3.8  CL  --  106  --  107 109 107 103  --  100 101  CO2  --  26  --  '27 28 29 25  '$ --  32 31  GLUCOSE  --  130*  --  139* 124* 126* 81  --  119* 121*  BUN  --  12  --  '18 18 17 21  '$ --  22 22  CREATININE  --  0.64  --  0.65 0.74 0.70 0.81  --  0.76 0.85  CALCIUM  --  8.6*  --  8.9 8.5* 8.9 9.1  --  9.1 9.0  MG 2.2 2.1 2.0 2.0  --   --   --   --   --  2.2  PHOS 3.5 3.2 3.0 2.7  --   --   --   --   --   --    GFR: Estimated Creatinine Clearance: 83.5 mL/min (by C-G formula based on SCr of 0.85  mg/dL). Recent Labs  Lab 06/21/22 1120 06/22/22 0343 06/23/22 0509 06/24/22 0604  WBC 4.8 9.2 10.2 9.9    Liver Function Tests: Recent Labs  Lab 06/18/22 0820 06/22/22 0343  AST 29 36  ALT 25 28  ALKPHOS 166* 195*  BILITOT 0.8 1.3*  PROT 5.3* 6.2*  ALBUMIN 1.8* 2.6*   No results for input(s): "LIPASE", "  AMYLASE" in the last 168 hours. Recent Labs  Lab 06/24/22 0931  AMMONIA 25    ABG    Component Value Date/Time   PHART 7.427 06/24/2022 1128   PCO2ART 47.8 06/24/2022 1128   PO2ART 78 (L) 06/24/2022 1128   HCO3 31.5 (H) 06/24/2022 1128   TCO2 33 (H) 06/24/2022 1128   O2SAT 95 06/24/2022 1128     Coagulation Profile: No results for input(s): "INR", "PROTIME" in the last 168 hours.  Cardiac Enzymes: No results for input(s): "CKTOTAL", "CKMB", "CKMBINDEX", "TROPONINI" in the last 168 hours.  HbA1C: Hgb A1c MFr Bld  Date/Time Value Ref Range Status  04/19/2022 10:39 AM 4.8 <5.7 % of total Hgb Final    Comment:    For the purpose of screening for the presence of diabetes: . <5.7%       Consistent with the absence of diabetes 5.7-6.4%    Consistent with increased risk for diabetes             (prediabetes) > or =6.5%  Consistent with diabetes . This assay result is consistent with a decreased risk of diabetes. . Currently, no consensus exists regarding use of hemoglobin A1c for diagnosis of diabetes in children. . According to American Diabetes Association (ADA) guidelines, hemoglobin A1c <7.0% represents optimal control in non-pregnant diabetic patients. Different metrics may apply to specific patient populations.  Standards of Medical Care in Diabetes(ADA). Marland Kitchen   03/24/2021 10:52 AM 5.6 <5.7 % of total Hgb Final    Comment:    For the purpose of screening for the presence of diabetes: . <5.7%       Consistent with the absence of diabetes 5.7-6.4%    Consistent with increased risk for diabetes             (prediabetes) > or =6.5%  Consistent  with diabetes . This assay result is consistent with a decreased risk of diabetes. . Currently, no consensus exists regarding use of hemoglobin A1c for diagnosis of diabetes in children. . According to American Diabetes Association (ADA) guidelines, hemoglobin A1c <7.0% represents optimal control in non-pregnant diabetic patients. Different metrics may apply to specific patient populations.  Standards of Medical Care in Diabetes(ADA). .     CBG: Recent Labs  Lab 06/24/22 1518 06/24/22 2011 06/24/22 2312 06/25/22 0320 06/25/22 0322  GLUCAP 75 70 89 58* 104*   CRITICAL CARE Performed by: Cristal Generous   Total critical care time: n/a    Eliseo Gum MSN, AGACNP-BC [place] for pager  06/25/2022, 7:51 AM

## 2022-06-25 NOTE — Progress Notes (Signed)
Rounding Note    Patient Name: Darrell Horne Date of Encounter: 06/25/2022  [place] Cardiologist: Marlou Porch  Subjective   Remains encephalopathic. Answers "hello" then asleep immediately  Inpatient Medications    Scheduled Meds:  amiodarone  200 mg Per Tube BID   amitriptyline  50 mg Per Tube QHS   apixaban  5 mg Per Tube BID   atorvastatin  40 mg Per Tube Daily   Chlorhexidine Gluconate Cloth  6 each Topical Daily   feeding supplement (PROSource TF20)  60 mL Per Tube Daily   folic acid  1 mg Per Tube Daily   free water  50 mL Per Tube Q4H   furosemide  80 mg Intravenous BID   lactulose  20 g Per Tube Daily   metoprolol tartrate  12.5 mg Per Tube BID   midodrine  5 mg Per Tube TID WC   multivitamin with minerals  1 tablet Per Tube Daily   mupirocin cream   Topical Daily   polyethylene glycol  17 g Per Tube Daily   potassium chloride  40 mEq Per Tube Once   spironolactone  100 mg Per Tube Daily   thiamine  100 mg Per Tube Daily   topiramate  75 mg Per Tube Daily   zinc sulfate  220 mg Per Tube Daily   Continuous Infusions:  sodium chloride Stopped (06/24/22 2344)   cefTRIAXone (ROCEPHIN)  IV     feeding supplement (OSMOLITE 1.5 CAL) 20 mL/hr at 06/25/22 1100   PRN Meds: acetaminophen **OR** acetaminophen, fentaNYL (SUBLIMAZE) injection, lidocaine (PF), mouth rinse   Vital Signs    Vitals:   06/25/22 0700 06/25/22 0811 06/25/22 0900 06/25/22 1000  BP: 107/75   94/60  Pulse: (!) 116 (!) 102 (!) 107 96  Resp: 16 (!) 21 20 (!) 21  Temp: 98 F (36.7 C)     TempSrc: Axillary     SpO2: 95% 90% 91% 91%  Weight:      Height:        Intake/Output Summary (Last 24 hours) at 06/25/2022 1216 Last data filed at 06/25/2022 1146 Gross per 24 hour  Intake 1612.31 ml  Output 3145 ml  Net -1532.69 ml      06/24/2022    5:00 AM 06/23/2022    5:36 AM 06/23/2022    3:52 AM  Last 3 Weights  Weight (lbs) 198 lb 6.6 oz 201 lb 4.5 oz 207 lb 0.2 oz  Weight (kg)  90 kg 91.3 kg 93.9 kg      Telemetry    AFib, rate controlled - Personally Reviewed  ECG    Afib, atypical LBBB - Personally Reviewed  Physical Exam  Stuporous. Appears chronically ill. Supine, no respiratory difficulty GEN: No acute distress.   Neck: No JVD Cardiac: irregular, no murmurs, rubs, or gallops.  Respiratory: Clear to auscultation bilaterally. GI: Soft, nontender, non-distended  MS: No edema; L BKA Neuro:  Nonfocal  Psych: Normal affect   Labs    High Sensitivity Troponin:  No results for input(s): "TROPONINIHS" in the last 720 hours.   Chemistry Recent Labs  Lab 06/22/22 0343 06/22/22 1659 06/23/22 0509 06/23/22 1300 06/24/22 0604 06/24/22 1128 06/24/22 1646 06/25/22 0621  NA 139  --  140   < > 141 143 142 143  K 3.8  --  3.7   < > 3.5 3.0* 2.8* 3.8  CL 106  --  107   < > 103  --  100 101  CO2 26  --  27   < > 25  --  32 31  GLUCOSE 130*  --  139*   < > 81  --  119* 121*  BUN 12  --  18   < > 21  --  22 22  CREATININE 0.64  --  0.65   < > 0.81  --  0.76 0.85  CALCIUM 8.6*  --  8.9   < > 9.1  --  9.1 9.0  MG 2.1 2.0 2.0  --   --   --   --  2.2  PROT 6.2*  --   --   --   --   --   --   --   ALBUMIN 2.6*  --   --   --   --   --   --   --   AST 36  --   --   --   --   --   --   --   ALT 28  --   --   --   --   --   --   --   ALKPHOS 195*  --   --   --   --   --   --   --   BILITOT 1.3*  --   --   --   --   --   --   --   GFRNONAA >60  --  >60   < > >60  --  >60 >60  ANIONGAP 7  --  6   < > 13  --  10 11   < > = values in this interval not displayed.    Lipids No results for input(s): "CHOL", "TRIG", "HDL", "LABVLDL", "LDLCALC", "CHOLHDL" in the last 168 hours.  Hematology Recent Labs  Lab 06/23/22 0509 06/24/22 0604 06/24/22 1128 06/25/22 0621  WBC 10.2 9.9  --  6.4  RBC 3.20* 3.06*  --  3.16*  HGB 10.8* 10.4* 10.5* 10.8*  HCT 33.0* 30.8* 31.0* 31.8*  MCV 103.1* 100.7*  --  100.6*  MCH 33.8 34.0  --  34.2*  MCHC 32.7 33.8  --  34.0  RDW  14.0 14.0  --  13.8  PLT 125* 140*  --  PLATELET CLUMPS NOTED ON SMEAR, COUNT APPEARS DECREASED   Thyroid No results for input(s): "TSH", "FREET4" in the last 168 hours.  BNP Recent Labs  Lab 06/19/22 0202  BNP 298.9*    DDimer No results for input(s): "DDIMER" in the last 168 hours.   Radiology    DG CHEST PORT 1 VIEW  Result Date: 06/24/2022 CLINICAL DATA:  Respiratory failure and bilateral indwelling chest tubes. EXAM: PORTABLE CHEST 1 VIEW COMPARISON:  06/23/2022 FINDINGS: Stable heart size. Stable positioning of bilateral basilar pigtail thoracostomy tubes. No significant residual pleural fluid with probable small amount basilar pleural fluid bilaterally. Persistent small right apical pneumothorax of less than 10% volume. Increased pulmonary opacities felt to represent interstitial edema bilaterally. Feeding tube extends into the stomach. IMPRESSION: 1. Persistent small right apical pneumothorax of less than 10% volume. 2. Bibasilar chest tubes remain with probable small amount of bilateral pleural fluid. 3. Increase in pulmonary interstitial edema. Electronically Signed   By: Aletta Edouard M.D.   On: 06/24/2022 08:32    Cardiac Studies   ECHO 06/20/2022  Echo 06/20/22:  1. Left ventricular ejection fraction, by estimation, is 55 to 60%. The  left ventricle has normal function. The left  ventricle has no regional  wall motion abnormalities. Left ventricular diastolic parameters are  indeterminate.   2. Right ventricular systolic function is low normal. The right  ventricular size is normal. There is mildly elevated pulmonary artery  systolic pressure. The estimated right ventricular systolic pressure is  63.8 mmHg.   3. Large pleural effusion in the left lateral region.   4. The mitral valve is grossly normal. Mild to moderate mitral valve  regurgitation.   5. The aortic valve was not well visualized. Aortic valve regurgitation  is trivial. No aortic stenosis is present.      Patient Profile     76 y.o. male w persistent atrial fibrillation, HTN, L BKA, HTN, cirrhosis admitted with altered mental status, large R pleural effusion, developed RVR  Assessment & Plan    Afib is well rate controlled on PO amiodarone and very low dose metoprolol (still requiring midodrine for BP support). Back on Eliquis. Remains encephalopathic. On amio "loading" dose for since August 1st (compliance questionable, but definitely on it for > 1 week since hospitalization). Has chronic liver disease. Will decrease amio to once daily and hopefully will be able to replace it with higher doses of beta blocker if BP improves.  [place] will sign off.   Medication Recommendations:  amiodarone 200 mg daily, metoprolol 12.5 mg daily, eliquis 5 mg twice daily Other recommendations (labs, testing, etc):  monitor LFTs on amiodarone Follow up as an outpatient:  please call when ready for DC.  For questions or updates, please contact [place] Please consult [email or web address] for contact info under        Signed, Sanda Klein, MD  06/25/2022, 12:16 PM

## 2022-06-26 ENCOUNTER — Inpatient Hospital Stay (HOSPITAL_COMMUNITY): Payer: Medicare Other

## 2022-06-26 DIAGNOSIS — I10 Essential (primary) hypertension: Secondary | ICD-10-CM

## 2022-06-26 DIAGNOSIS — D649 Anemia, unspecified: Secondary | ICD-10-CM | POA: Diagnosis not present

## 2022-06-26 DIAGNOSIS — J9601 Acute respiratory failure with hypoxia: Secondary | ICD-10-CM | POA: Diagnosis not present

## 2022-06-26 DIAGNOSIS — G9340 Encephalopathy, unspecified: Secondary | ICD-10-CM | POA: Diagnosis not present

## 2022-06-26 DIAGNOSIS — J90 Pleural effusion, not elsewhere classified: Secondary | ICD-10-CM | POA: Diagnosis not present

## 2022-06-26 LAB — GLUCOSE, CAPILLARY
Glucose-Capillary: 139 mg/dL — ABNORMAL HIGH (ref 70–99)
Glucose-Capillary: 90 mg/dL (ref 70–99)
Glucose-Capillary: 88 mg/dL (ref 70–99)
Glucose-Capillary: 66 mg/dL — ABNORMAL LOW (ref 70–99)
Glucose-Capillary: 73 mg/dL (ref 70–99)
Glucose-Capillary: 86 mg/dL (ref 70–99)

## 2022-06-26 LAB — CBC
HCT: 33.7 % — ABNORMAL LOW (ref 39.0–52.0)
Hemoglobin: 11.5 g/dL — ABNORMAL LOW (ref 13.0–17.0)
MCH: 34.2 pg — ABNORMAL HIGH (ref 26.0–34.0)
MCHC: 34.1 g/dL (ref 30.0–36.0)
MCV: 100.3 fL — ABNORMAL HIGH (ref 80.0–100.0)
Platelets: 147 10*3/uL — ABNORMAL LOW (ref 150–400)
RBC: 3.36 MIL/uL — ABNORMAL LOW (ref 4.22–5.81)
RDW: 13.7 % (ref 11.5–15.5)
WBC: 6.7 10*3/uL (ref 4.0–10.5)
nRBC: 0 % (ref 0.0–0.2)

## 2022-06-26 LAB — BASIC METABOLIC PANEL
Anion gap: 11 (ref 5–15)
BUN: 25 mg/dL — ABNORMAL HIGH (ref 8–23)
CO2: 32 mmol/L (ref 22–32)
Calcium: 9.2 mg/dL (ref 8.9–10.3)
Chloride: 101 mmol/L (ref 98–111)
Creatinine, Ser: 0.87 mg/dL (ref 0.61–1.24)
GFR, Estimated: >60 mL/min (ref >60)
Glucose, Bld: 141 mg/dL — ABNORMAL HIGH (ref 70–99)
Potassium: 3.2 mmol/L — ABNORMAL LOW (ref 3.5–5.1)
Sodium: 144 mmol/L (ref 135–145)

## 2022-06-26 LAB — MAGNESIUM: Magnesium: 2.1 mg/dL (ref 1.7–2.4)

## 2022-06-26 LAB — AMMONIA: Ammonia: 20 umol/L (ref 9–35)

## 2022-06-26 MED ORDER — POTASSIUM CHLORIDE 20 MEQ PO PACK
40.0000 meq | PACK | ORAL | Status: AC
  Administered 2022-06-26 (×2): 40 meq
  Filled 2022-06-26 (×2): qty 2

## 2022-06-26 MED ORDER — DEXTROSE 50 % IV SOLN
INTRAVENOUS | Status: AC
  Administered 2022-06-26: 50 mL
  Filled 2022-06-26: qty 50

## 2022-06-26 MED ORDER — LACTULOSE 10 GM/15ML PO SOLN
30.0000 g | Freq: Two times a day (BID) | ORAL | Status: DC
  Administered 2022-06-26 – 2022-06-27 (×2): 30 g
  Filled 2022-06-26 (×2): qty 45

## 2022-06-26 MED ORDER — LACTULOSE 10 GM/15ML PO SOLN: 30.0000 g | Freq: Three times a day (TID) | ORAL | Status: DC

## 2022-06-26 NOTE — Progress Notes (Signed)
NAME:  Darrell Horne, MRN:  [id number], DOB:  Apr 19, 1946, LOS: 58 ADMISSION DATE:  06/16/2022, CONSULTATION DATE:  06/22/2022 REFERRING MD:  Dr. Louanne Belton, CHIEF COMPLAINT:  Acute Hypoxic Respiratory Failure, Pleural Effusion   History of Present Illness:  Darrell Horne is a 76 year old male with a past medical history of paroxysmal A-fib on Eliquis, left BKA, HTN, HLD, and alcoholic cirrhosis who presented to the hospital with altered mental status and was admitted on 06/16/2022.  He was found to have a large right-sided pleural effusion and his intermittently increased since August 26.  Thoracentesis on 06/17/2022 81 with 2 L of yellow fluid was found to be transudative pleural effusion.  Initially he did well after the procedure but over the past couple days has had increased oxygen requirements and episodes of hypotension.  Hypotension was treated with albumin and midodrine.  PCCM was consulted for further evaluation and management of his right-sided pleural effusion and acute hypoxic respiratory failure.  Pertinent  Medical History   Past Medical History:  Diagnosis Date   Chest pain    Diabetes mellitus    DM2 (diabetes mellitus, type 2) (HCC)    GERD (gastroesophageal reflux disease)    HLD (hyperlipidemia)    HTN (hypertension)    PAF (paroxysmal atrial fibrillation) ([place])    Palpitations      Significant Hospital Events: Including procedures, antibiotic start and stop dates in addition to other pertinent events   9/8 thoracentesis with return of 1.2 L of transudative fluid 9/12 increasing oxygen requirements with up to 6 L high flow nasal cannula and episodes of hypotension with systolic of 89.  Albumin, midodrine, and tube feeds started. 9/13 PCCM consulted, Eliquis held for planned thoracentesis tomorrow. CXR with stable pulm edema and pleural effusions 9/14 increased O2 requirements, stable on BiPAP now. Bilateral chest tubes placed with return of 6 L of yellow fluid. Off BiPAP 9/15  doing well off BiPAP overnight, on 5L [place], slowed drainage from bilat chest tubes, drastically improved peripheral edema.  9/16 off bipap. Remains off pressors. Stable to transfer out of ICU   Interim History / Subjective:   Chronically ill-appearing elderly gentleman.  No acute distress.  Objective   Blood pressure (!) 95/59, pulse 95, temperature 98.3 F (36.8 C), temperature source Axillary, resp. rate 18, height '5\' 10"'$  (1.778 m), weight 76.5 kg, SpO2 93 %.        Intake/Output Summary (Last 24 hours) at 06/26/2022 1127 Last data filed at 06/26/2022 0500 Gross per 24 hour  Intake 362.33 ml  Output 2840 ml  Net -2477.67 ml   Filed Weights   06/23/22 0536 06/24/22 0500 06/26/22 0414  Weight: 91.3 kg 90 kg 76.5 kg    Examination: General: Chronically ill-appearing elderly gentleman resting comfortably in bed HENT: See AT, tracking appropriately, core track in place Lungs: Breath sounds bilaterally no crackles no wheeze, chest tubes in place Cardiovascular: Irregular, S1-S2 Abdomen: Soft, nontender nondistended Extremities: Left BKA Neuro: Oriented following commands Skin: Venous stasis changes, scattered bruising  Resolved Hospital Problem list     Assessment & Plan:   Acute respiratory failure with hypoxia  Bilateral pleural effusion R apical ptx, small Pulmonary edema  P Chest drains still have significant output. Placed on waterseal today. Continue Lasix plus spironolactone [place] RUQ today to look at liver more closely  CXR in AM  May be able to pull tubes tomorrow?   Portal Hypertension Cirrhosis, ascites, pleural effusion, volume overload - Cirrhosis first documented 2015 on  CT imaging. P Lactulose Rocephin Spironolactone plus Lasix Ultrasound liver ordered   Acute metabolic encephalopathy Possible hypoactive ICU delirium P Delirium precautions  Paroxysmal Afib, rate controlled  RV dysfunction  -CHA2DS2-VASc score of 4.   P Eliquis,  amiodarone  Hypotension, improved Hx HTN  P -MAP goal > 65    Chronic anemia -PRN CBC   GERD -PPI   Hyperlipidemia -statin.  Malnutrition Debility Deconditioning  Continue tube feeds   Best Practice (right click and "Reselect all SmartList Selections" daily)   Diet/type: tubefeeds DVT prophylaxis: DOAC GI prophylaxis: PPI Lines: N/A Foley:  N/A Code Status:  full code Last date of multidisciplinary goals of care discussion [9/14]   Labs   CBC: Recent Labs  Lab 06/22/22 0343 06/23/22 0509 06/24/22 0604 06/24/22 1128 06/25/22 0621 06/26/22 0120  WBC 9.2 10.2 9.9  --  6.4 6.7  NEUTROABS  --   --  7.1  --   --   --   HGB 11.5* 10.8* 10.4* 10.5* 10.8* 11.5*  HCT 33.7* 33.0* 30.8* 31.0* 31.8* 33.7*  MCV 101.5* 103.1* 100.7*  --  100.6* 100.3*  PLT 169 125* 140*  --  PLATELET CLUMPS NOTED ON SMEAR, COUNT APPEARS DECREASED 147*    Basic Metabolic Panel: Recent Labs  Lab 06/21/22 1904 06/22/22 0343 06/22/22 1659 06/23/22 0509 06/23/22 1300 06/23/22 1639 06/24/22 0604 06/24/22 1128 06/24/22 1646 06/25/22 0621 06/26/22 0120  NA  --  139  --  140   < > 144 141 143 142 143 144  K  --  3.8  --  3.7   < > 3.1* 3.5 3.0* 2.8* 3.8 3.2*  CL  --  106  --  107   < > 107 103  --  100 101 101  CO2  --  26  --  27   < > 29 25  --  32 31 32  GLUCOSE  --  130*  --  139*   < > 126* 81  --  119* 121* 141*  BUN  --  12  --  18   < > 17 21  --  22 22 25*  CREATININE  --  0.64  --  0.65   < > 0.70 0.81  --  0.76 0.85 0.87  CALCIUM  --  8.6*  --  8.9   < > 8.9 9.1  --  9.1 9.0 9.2  MG 2.2 2.1 2.0 2.0  --   --   --   --   --  2.2 2.1  PHOS 3.5 3.2 3.0 2.7  --   --   --   --   --   --   --    < > = values in this interval not displayed.   GFR: Estimated Creatinine Clearance: 74.6 mL/min (by C-G formula based on SCr of 0.87 mg/dL). Recent Labs  Lab 06/23/22 0509 06/24/22 0604 06/25/22 0621 06/26/22 0120  WBC 10.2 9.9 6.4 6.7    Liver Function Tests: Recent Labs   Lab 06/22/22 0343  AST 36  ALT 28  ALKPHOS 195*  BILITOT 1.3*  PROT 6.2*  ALBUMIN 2.6*   No results for input(s): "LIPASE", "AMYLASE" in the last 168 hours. Recent Labs  Lab 06/24/22 0931  AMMONIA 25    ABG    Component Value Date/Time   PHART 7.427 06/24/2022 1128   PCO2ART 47.8 06/24/2022 1128   PO2ART 78 (L) 06/24/2022 1128   HCO3 31.5 (H) 06/24/2022 1128  TCO2 33 (H) 06/24/2022 1128   O2SAT 95 06/24/2022 1128     Coagulation Profile: No results for input(s): "INR", "PROTIME" in the last 168 hours.  Cardiac Enzymes: No results for input(s): "CKTOTAL", "CKMB", "CKMBINDEX", "TROPONINI" in the last 168 hours.  HbA1C: Hgb A1c MFr Bld  Date/Time Value Ref Range Status  04/19/2022 10:39 AM 4.8 <5.7 % of total Hgb Final    Comment:    For the purpose of screening for the presence of diabetes: . <5.7%       Consistent with the absence of diabetes 5.7-6.4%    Consistent with increased risk for diabetes             (prediabetes) > or =6.5%  Consistent with diabetes . This assay result is consistent with a decreased risk of diabetes. . Currently, no consensus exists regarding use of hemoglobin A1c for diagnosis of diabetes in children. . According to American Diabetes Association (ADA) guidelines, hemoglobin A1c <7.0% represents optimal control in non-pregnant diabetic patients. Different metrics may apply to specific patient populations.  Standards of Medical Care in Diabetes(ADA). Marland Kitchen   03/24/2021 10:52 AM 5.6 <5.7 % of total Hgb Final    Comment:    For the purpose of screening for the presence of diabetes: . <5.7%       Consistent with the absence of diabetes 5.7-6.4%    Consistent with increased risk for diabetes             (prediabetes) > or =6.5%  Consistent with diabetes . This assay result is consistent with a decreased risk of diabetes. . Currently, no consensus exists regarding use of hemoglobin A1c for diagnosis of diabetes in  children. . According to American Diabetes Association (ADA) guidelines, hemoglobin A1c <7.0% represents optimal control in non-pregnant diabetic patients. Different metrics may apply to specific patient populations.  Standards of Medical Care in Diabetes(ADA). .     CBG: Recent Labs  Lab 06/25/22 1620 06/25/22 2007 06/25/22 2356 06/26/22 0416 06/26/22 0756  GLUCAP 112* 82 107* 139* 90     Garner Nash, DO [facility] 06/26/2022 11:27 AM

## 2022-06-26 NOTE — Progress Notes (Addendum)
PROGRESS NOTE    Darrell Horne  XWR:[id number] DOB: 1946-08-18 DOA: 06/16/2022 PCP: Sandrea Hughs, NP    Chief Complaint  Patient presents with   Altered Mental Status    Brief Narrative:  Darrell Horne is a 76 year old male with a past medical history of paroxysmal A-fib on Eliquis, left BKA, HTN, HLD, and alcoholic cirrhosis who presented to the hospital with altered mental status and was admitted on 06/16/2022.  He was found to have a large right-sided pleural effusion and his intermittently increased since August 26.  Thoracentesis on 06/17/2022 81 with 2 L of yellow fluid was found to be transudative pleural effusion.  Initially he did well after the procedure but over the past couple days has had increased oxygen requirements and episodes of hypotension.  Hypotension was treated with albumin and midodrine.  PCCM was consulted for further evaluation and management of his right-sided pleural effusion and acute hypoxic respiratory failure. Patient underwent thoracentesis with transudative fluid noted, noted to have increased O2 requirements and hypotensive, requiring albumin midodrine and tube feeds started.  Eliquis was also started.  Patient placed on diuretics.  Patient transferred out of the ICU as he no longer required BiPAP and transferred to [facility] service.  PCCM following for chest tube management.   Assessment & Plan:   Principal Problem:   Acute encephalopathy Active Problems:   PAF (paroxysmal atrial fibrillation) (HCC)   DM2 (diabetes mellitus, type 2) (HCC)   HTN (hypertension)   HLD (hyperlipidemia)   Hx of BKA, left (HCC)   Overdose by ingestion   Pleural effusion on right   Pressure injury of skin   Anemia   Protein-calorie malnutrition, severe   Acute respiratory failure with hypoxia (HCC)   Bilateral pleural effusion  #1 acute respiratory failure with hypoxia/bilateral pleural effusions status post bilateral chest tube placement/right apical pneumothorax  small/pulmonary edema -Patient status post bilateral chest tube placement per PCCM, still with significant output of chest tubes with a total of 395 cc out over the past 24 hours.  Patient with sats of 94% on 2 to 3 L nasal cannula. -Patient started on diuretics and spironolactone with urine output of 2.7 L over the past 24 hours.  Patient is -17.76 2 L during this hospitalization. -PCCM following and managing chest tubes.  2.  Cirrhosis/ascites/volume overload/pleural effusion/portal hypertension -Patient noted to have cirrhosis documented on CT imaging on 2015. -Right upper quadrant ultrasound done with cirrhotic morphology of the liver, no focal lesion identified, status postcholecystectomy, trace ascites, incidental note of right pleural effusion. -Continue IV Rocephin, spironolactone, Lasix, lactulose.  3.  Acute metabolic encephalopathy/possible hypoactive ICU delirium/probable hepatic encephalopathy -Patient currently on delirium precautions. -Check an ammonia level. -Increase lactulose to 30 g 2 times daily per tube. -Supportive care.  4.  Paroxysmal atrial fibrillation - CHA2DS2VASC score 4 -Continue amiodarone 200 mg daily, metoprolol 12.5 mg daily for rate control. -Eliquis for anticoagulation. -Follow LFTs as patient on amiodarone per cardiology recommendations. -Cardiology was following but have signed off as of 06/25/2022. -Outpatient follow-up with cardiology.  5.  Hypertension -BP borderline. -Continue midodrine 3 times daily. -Monitor BP on diuretics and beta-blocker.  6.  GERD -PPI.  7.  Hyperlipidemia -Statin.  8.  Chronic anemia -Follow H&H. -Transfusion threshold hemoglobin < 7.  9.  Malnutrition/debility/deconditioning -Continue current tube feeds. -PT/OT when patient more alert.  10.  Hypokalemia -Likely secondary to diuresis. -Replete.  11. Pressure injury, POA Pressure Injury 06/18/22 Buttocks Left;Medial Stage 2 -  Partial  thickness loss of  dermis presenting as a shallow open injury with a red, pink wound bed without slough. blister that burst (Active)  06/18/22 0700  Location: Buttocks  Location Orientation: Left;Medial  Staging: Stage 2 -  Partial thickness loss of dermis presenting as a shallow open injury with a red, pink wound bed without slough.  Wound Description (Comments): blister that burst  Present on Admission: Yes        DVT prophylaxis: Eliquis Code Status: Full Family Communication: No family at bedside. Disposition: TBD  Status is: Inpatient Remains inpatient appropriate because: Severity of illness   Consultants:  [place] RN 06/21/2022 PCCM: Timmothy Euler 06/22/2022 Cardiology: Dr. Angelena Form 06/22/2022 Palliative care Dr. Hilma Favors 06/23/2022  Procedures:  CT head 06/16/2022 Chest x-ray 06/16/2022, 06/21/2022, 06/22/2022, 06/23/2022, 06/24/2022, 06/26/2022 Right upper quadrant ultrasound 06/26/2022 2D echo 06/20/2022 Ultrasound-guided right thoracentesis 06/17/2022 per IR, Dr.Mir--1.2 L of yellow fluid removed Cortrack feeding tube placement 06/21/2022 Bilateral chest tube placement per PCCM, Dr. Tamala Julian 06/23/2021   Significant Hospital Events: Including procedures, antibiotic start and stop dates in addition to other pertinent events   9/8 thoracentesis with return of 1.2 L of transudative fluid 9/12 increasing oxygen requirements with up to 6 L high flow nasal cannula and episodes of hypotension with systolic of 89.  Albumin, midodrine, and tube feeds started. 9/13 PCCM consulted, Eliquis held for planned thoracentesis tomorrow. CXR with stable pulm edema and pleural effusions 9/14 increased O2 requirements, stable on BiPAP now. Bilateral chest tubes placed with return of 6 L of yellow fluid. Off BiPAP 9/15 doing well off BiPAP overnight, on 5L [place], slowed drainage from bilat chest tubes, drastically improved peripheral edema.  9/16 off bipap. Remains off pressors. Stable to transfer out of ICU   Antimicrobials:   Anti-infectives (From admission, onward)    Start     Dose/Rate Route Frequency Ordered Stop   06/25/22 1200  cefTRIAXone (ROCEPHIN) 1 g in sodium chloride 0.9 % 100 mL IVPB        1 g 200 mL/hr over 30 Minutes Intravenous Every 24 hours 06/25/22 1131     06/23/22 1400  ceFEPIme (MAXIPIME) 2 g in sodium chloride 0.9 % 100 mL IVPB  Status:  Discontinued        2 g 200 mL/hr over 30 Minutes Intravenous Every 8 hours 06/23/22 1045 06/25/22 1131   06/23/22 1000  ceFEPIme (MAXIPIME) 1 g in sodium chloride 0.9 % 100 mL IVPB  Status:  Discontinued        1 g 200 mL/hr over 30 Minutes Intravenous Every 12 hours 06/23/22 0529 06/23/22 1045   06/23/22 1000  linezolid (ZYVOX) tablet 600 mg  Status:  Discontinued        600 mg Per Tube Every 12 hours 06/23/22 0529 06/24/22 1100   06/22/22 1145  cefTRIAXone (ROCEPHIN) 1 g in sodium chloride 0.9 % 100 mL IVPB  Status:  Discontinued        1 g 200 mL/hr over 30 Minutes Intravenous Every 24 hours 06/22/22 1055 06/23/22 0529   06/22/22 1145  azithromycin (ZITHROMAX) 500 mg in sodium chloride 0.9 % 250 mL IVPB  Status:  Discontinued        500 mg 250 mL/hr over 60 Minutes Intravenous Every 24 hours 06/22/22 1055 06/23/22 0529         Subjective: Drowsy/lethargic.  Occasional moaning.  Opens eyes intermittently and drifts back off to sleep.  Objective: Vitals:   06/25/22 2300 06/26/22 0414 06/26/22 0500 06/26/22 0750  BP: 98/67 99/60 (!) 114/90 (!) 95/59  Pulse: (!) 105 99 100 95  Resp: '20 20 17 18  '$ Temp: 98.6 F (37 C) 98.3 F (36.8 C)  98.3 F (36.8 C)  TempSrc: Axillary Axillary  Axillary  SpO2: 92% 91% 94% 93%  Weight:  76.5 kg    Height:        Intake/Output Summary (Last 24 hours) at 06/26/2022 1113 Last data filed at 06/26/2022 0500 Gross per 24 hour  Intake 362.33 ml  Output 2840 ml  Net -2477.67 ml   Filed Weights   06/23/22 0536 06/24/22 0500 06/26/22 0414  Weight: 91.3 kg 90 kg 76.5 kg    Examination:  General exam:  Drowsy/somewhat lethargic.Cortrack. Respiratory system: Clear to auscultation anterior lung fields. Respiratory effort normal.  Bilateral chest tubes Cardiovascular system: Irregularly irregular. No JVD, murmurs, rubs, gallops or clicks. No lower extremity edema on the right leg. Gastrointestinal system: Abdomen is nondistended, soft and nontender. No organomegaly or masses felt. Normal bowel sounds heard. Central nervous system: Drowsy/lethargic.  Moving extremities spontaneously.  Extremities: Status post left BKA.  Unable to assess due to mental status. Skin: No rashes, lesions or ulcers Psychiatry: Judgement and insight unable to assess as patient drowsy/lethargic.      Data Reviewed: I have personally reviewed following labs and imaging studies  CBC: Recent Labs  Lab 06/22/22 0343 06/23/22 0509 06/24/22 0604 06/24/22 1128 06/25/22 0621 06/26/22 0120  WBC 9.2 10.2 9.9  --  6.4 6.7  NEUTROABS  --   --  7.1  --   --   --   HGB 11.5* 10.8* 10.4* 10.5* 10.8* 11.5*  HCT 33.7* 33.0* 30.8* 31.0* 31.8* 33.7*  MCV 101.5* 103.1* 100.7*  --  100.6* 100.3*  PLT 169 125* 140*  --  PLATELET CLUMPS NOTED ON SMEAR, COUNT APPEARS DECREASED 147*    Basic Metabolic Panel: Recent Labs  Lab 06/21/22 1904 06/22/22 0343 06/22/22 1659 06/23/22 0509 06/23/22 1300 06/23/22 1639 06/24/22 0604 06/24/22 1128 06/24/22 1646 06/25/22 0621 06/26/22 0120  NA  --  139  --  140   < > 144 141 143 142 143 144  K  --  3.8  --  3.7   < > 3.1* 3.5 3.0* 2.8* 3.8 3.2*  CL  --  106  --  107   < > 107 103  --  100 101 101  CO2  --  26  --  27   < > 29 25  --  32 31 32  GLUCOSE  --  130*  --  139*   < > 126* 81  --  119* 121* 141*  BUN  --  12  --  18   < > 17 21  --  22 22 25*  CREATININE  --  0.64  --  0.65   < > 0.70 0.81  --  0.76 0.85 0.87  CALCIUM  --  8.6*  --  8.9   < > 8.9 9.1  --  9.1 9.0 9.2  MG 2.2 2.1 2.0 2.0  --   --   --   --   --  2.2 2.1  PHOS 3.5 3.2 3.0 2.7  --   --   --   --   --   --    --    < > = values in this interval not displayed.    GFR: Estimated Creatinine Clearance: 74.6 mL/min (by C-G formula based on SCr of 0.87  mg/dL).  Liver Function Tests: Recent Labs  Lab 06/22/22 0343  AST 36  ALT 28  ALKPHOS 195*  BILITOT 1.3*  PROT 6.2*  ALBUMIN 2.6*    CBG: Recent Labs  Lab 06/25/22 1620 06/25/22 2007 06/25/22 2356 06/26/22 0416 06/26/22 0756  GLUCAP 112* 82 107* 139* 90     Recent Results (from the past 240 hour(s))  SARS Coronavirus 2 by RT PCR (hospital order, performed in [facility] hospital lab) *cepheid single result test* Anterior Nasal Swab     Status: None   Collection Time: 06/16/22  3:10 PM   Specimen: Anterior Nasal Swab  Result Value Ref Range Status   SARS Coronavirus 2 by RT PCR NEGATIVE NEGATIVE Final    Comment: (NOTE) SARS-CoV-2 target nucleic acids are NOT DETECTED.  The SARS-CoV-2 RNA is generally detectable in upper and lower respiratory specimens during the acute phase of infection. The lowest concentration of SARS-CoV-2 viral copies this assay can detect is 250 copies / mL. A negative result does not preclude SARS-CoV-2 infection and should not be used as the sole basis for treatment or other patient management decisions.  A negative result may occur with improper specimen collection / handling, submission of specimen other than nasopharyngeal swab, presence of viral mutation(s) within the areas targeted by this assay, and inadequate number of viral copies (<250 copies / mL). A negative result must be combined with clinical observations, patient history, and epidemiological information.  Fact Sheet for Patients:   https://www.patel.info/  Fact Sheet for Healthcare Providers: https://hall.com/  This test is not yet approved or  cleared by the [place] FDA and has been authorized for detection and/or diagnosis of SARS-CoV-2 by FDA under an Emergency Use Authorization  (EUA).  This EUA will remain in effect (meaning this test can be used) for the duration of the COVID-19 declaration under Section 564(b)(1) of the Act, 21 U.S.C. section 360bbb-3(b)(1), unless the authorization is terminated or revoked sooner.  Performed at [facility], [place] [address] [place], [place] [address]   Gram stain     Status: None   Collection Time: 06/17/22 12:59 PM   Specimen: Lung, Right; Pleural Fluid  Result Value Ref Range Status   Specimen Description PLEURAL  Final   Special Requests  LUNG, RIGHT  Final   Gram Stain   Final    NO WBC SEEN NO ORGANISMS SEEN Performed at [facility], [address] [place], [place] [address]    Report Status 06/17/2022 FINAL  Final  Culture, body fluid w Gram Stain-bottle     Status: None   Collection Time: 06/17/22 12:59 PM   Specimen: Pleura  Result Value Ref Range Status   Specimen Description PLEURAL  Final   Special Requests  LUNG, RIGHT  Final   Culture   Final    NO GROWTH 5 DAYS Performed at [facility] [address], [place], [place] [address]    Report Status 06/22/2022 FINAL  Final  MRSA Next Gen by PCR, Nasal     Status: None   Collection Time: 06/23/22 10:21 AM   Specimen: Nasal Mucosa; Nasal Swab  Result Value Ref Range Status   MRSA by PCR Next Gen NOT DETECTED NOT DETECTED Final    Comment: (NOTE) The GeneXpert MRSA Assay (FDA approved for NASAL specimens only), is one component of a comprehensive MRSA colonization surveillance program. It is not intended to diagnose MRSA infection nor to guide or monitor treatment for  MRSA infections. Test performance is not FDA approved in patients less than 42 years old. Performed at [facility], [place] [address], [place], [place] [address]          Radiology Studies: DG CHEST PORT 1 VIEW  Result Date: 06/26/2022 CLINICAL DATA:  Follow-up chest tube placement for pneumothoraces EXAM: PORTABLE CHEST 1 VIEW COMPARISON:  06/24/2022  FINDINGS: Enteric tube tip is well below the level of the GE junction. Unchanged position of bilateral basilar pigtail thoracostomy tubes. Right apical pneumothorax is unchanged in volume from previous exam, less than 10%. Increase interstitial markings within both lungs appears similar to the previous exam. Patchy airspace opacities in the left base and periphery of the right lower lobe are stable. IMPRESSION: 1. Stable position of bilateral pigtail thoracostomy tubes with stable small right apical pneumothorax. 2. No change in aeration to the lungs compared with previous exam. Electronically Signed   By: Kerby Moors M.D.   On: 06/26/2022 07:52        Scheduled Meds:  amiodarone  200 mg Per Tube BID   amitriptyline  50 mg Per Tube QHS   apixaban  5 mg Per Tube BID   atorvastatin  40 mg Per Tube Daily   Chlorhexidine Gluconate Cloth  6 each Topical Daily   feeding supplement (OSMOLITE 1.5 CAL)  1,000 mL Per Tube Q24H   feeding supplement (PROSource TF20)  60 mL Per Tube Daily   folic acid  1 mg Per Tube Daily   free water  50 mL Per Tube Q4H   furosemide  80 mg Intravenous BID   lactulose  20 g Per Tube Daily   metoprolol tartrate  12.5 mg Per Tube BID   midodrine  5 mg Per Tube TID WC   multivitamin with minerals  1 tablet Per Tube Daily   mupirocin cream   Topical Daily   polyethylene glycol  17 g Per Tube Daily   potassium chloride  40 mEq Per Tube Q4H   spironolactone  100 mg Per Tube Daily   thiamine  100 mg Per Tube Daily   topiramate  75 mg Per Tube Daily   zinc sulfate  220 mg Per Tube Daily   Continuous Infusions:  sodium chloride Stopped (06/24/22 2344)   cefTRIAXone (ROCEPHIN)  IV Stopped (06/25/22 1413)     LOS: 10 days    Time spent: 40 minutes    Irine Seal, MD [facility]   To contact the attending provider between 7A-7P or the covering provider during after hours 7P-7A, please log into the web site [email or web address] and access using universal  [place] password for that web site. If you do not have the password, please call the hospital operator.  06/26/2022, 11:13 AM

## 2022-06-26 NOTE — Progress Notes (Signed)
Outpatient cardiology follow up arranged on 07/15/22, see AVS

## 2022-06-27 ENCOUNTER — Inpatient Hospital Stay (HOSPITAL_COMMUNITY): Payer: Medicare Other

## 2022-06-27 DIAGNOSIS — G9340 Encephalopathy, unspecified: Secondary | ICD-10-CM | POA: Diagnosis not present

## 2022-06-27 DIAGNOSIS — D649 Anemia, unspecified: Secondary | ICD-10-CM | POA: Diagnosis not present

## 2022-06-27 DIAGNOSIS — J90 Pleural effusion, not elsewhere classified: Secondary | ICD-10-CM | POA: Diagnosis not present

## 2022-06-27 DIAGNOSIS — J9601 Acute respiratory failure with hypoxia: Secondary | ICD-10-CM | POA: Diagnosis not present

## 2022-06-27 LAB — CBC
HCT: 33.3 % — ABNORMAL LOW (ref 39.0–52.0)
Hemoglobin: 10.8 g/dL — ABNORMAL LOW (ref 13.0–17.0)
MCH: 33 pg (ref 26.0–34.0)
MCHC: 32.4 g/dL (ref 30.0–36.0)
MCV: 101.8 fL — ABNORMAL HIGH (ref 80.0–100.0)
Platelets: UNDETERMINED 10*3/uL (ref 150–400)
RBC: 3.27 MIL/uL — ABNORMAL LOW (ref 4.22–5.81)
RDW: 13.9 % (ref 11.5–15.5)
WBC: 6.3 10*3/uL (ref 4.0–10.5)
nRBC: 0 % (ref 0.0–0.2)

## 2022-06-27 LAB — GLUCOSE, CAPILLARY
Glucose-Capillary: 59 mg/dL — ABNORMAL LOW (ref 70–99)
Glucose-Capillary: 74 mg/dL (ref 70–99)
Glucose-Capillary: 160 mg/dL — ABNORMAL HIGH (ref 70–99)
Glucose-Capillary: 123 mg/dL — ABNORMAL HIGH (ref 70–99)
Glucose-Capillary: 99 mg/dL (ref 70–99)
Glucose-Capillary: 122 mg/dL — ABNORMAL HIGH (ref 70–99)
Glucose-Capillary: <10 mg/dL — CL (ref 70–99)
Glucose-Capillary: 160 mg/dL — ABNORMAL HIGH (ref 70–99)

## 2022-06-27 LAB — BASIC METABOLIC PANEL
Anion gap: 7 (ref 5–15)
BUN: 26 mg/dL — ABNORMAL HIGH (ref 8–23)
CO2: 36 mmol/L — ABNORMAL HIGH (ref 22–32)
Calcium: 9.3 mg/dL (ref 8.9–10.3)
Chloride: 103 mmol/L (ref 98–111)
Creatinine, Ser: 0.88 mg/dL (ref 0.61–1.24)
GFR, Estimated: >60 mL/min (ref >60)
Glucose, Bld: 148 mg/dL — ABNORMAL HIGH (ref 70–99)
Potassium: 3 mmol/L — ABNORMAL LOW (ref 3.5–5.1)
Sodium: 146 mmol/L — ABNORMAL HIGH (ref 135–145)

## 2022-06-27 LAB — HEPATIC FUNCTION PANEL
ALT: 54 U/L — ABNORMAL HIGH (ref 0–44)
AST: 97 U/L — ABNORMAL HIGH (ref 15–41)
Albumin: 2.1 g/dL — ABNORMAL LOW (ref 3.5–5.0)
Alkaline Phosphatase: 209 U/L — ABNORMAL HIGH (ref 38–126)
Bilirubin, Direct: 0.3 mg/dL — ABNORMAL HIGH (ref 0.0–0.2)
Indirect Bilirubin: 0.6 mg/dL (ref 0.3–0.9)
Total Bilirubin: 0.9 mg/dL (ref 0.3–1.2)
Total Protein: 5.3 g/dL — ABNORMAL LOW (ref 6.5–8.1)

## 2022-06-27 LAB — MAGNESIUM: Magnesium: 2.2 mg/dL (ref 1.7–2.4)

## 2022-06-27 MED ORDER — DEXTROSE 50 % IV SOLN
12.5000 g | INTRAVENOUS | Status: AC
  Administered 2022-06-27: 12.5 g via INTRAVENOUS

## 2022-06-27 MED ORDER — FREE WATER
100.0000 mL | Status: DC
  Administered 2022-06-27 – 2022-07-03 (×39): 100 mL

## 2022-06-27 MED ORDER — POTASSIUM CHLORIDE 20 MEQ PO PACK
40.0000 meq | PACK | ORAL | Status: AC
  Administered 2022-06-27 (×2): 40 meq
  Filled 2022-06-27 (×2): qty 2

## 2022-06-27 MED ORDER — FUROSEMIDE 10 MG/ML IJ SOLN
80.0000 mg | Freq: Every day | INTRAMUSCULAR | Status: DC
  Administered 2022-06-27 – 2022-07-02 (×6): 80 mg via INTRAVENOUS
  Filled 2022-06-27 (×6): qty 8

## 2022-06-27 MED ORDER — LACTULOSE 10 GM/15ML PO SOLN
20.0000 g | Freq: Every day | ORAL | Status: DC
  Administered 2022-06-28 – 2022-07-04 (×6): 20 g
  Filled 2022-06-27 (×8): qty 30

## 2022-06-27 NOTE — Progress Notes (Signed)
NAME:  Darrell Horne, MRN:  [id number], DOB:  14-May-1946, LOS: 20 ADMISSION DATE:  06/16/2022, CONSULTATION DATE:  06/22/2022 REFERRING MD:  Dr. Louanne Belton, CHIEF COMPLAINT:  Acute Hypoxic Respiratory Failure, Pleural Effusion   History of Present Illness:  Darrell Horne is a 76 year old male with a past medical history of paroxysmal A-fib on Eliquis, left BKA, HTN, HLD, and alcoholic cirrhosis who presented to the hospital with altered mental status and was admitted on 06/16/2022.  He was found to have a large right-sided pleural effusion and his intermittently increased since August 26.  Thoracentesis on 06/17/2022 81 with 2 L of yellow fluid was found to be transudative pleural effusion.  Initially he did well after the procedure but over the past couple days has had increased oxygen requirements and episodes of hypotension.  Hypotension was treated with albumin and midodrine.  PCCM was consulted for further evaluation and management of his right-sided pleural effusion and acute hypoxic respiratory failure.  Pertinent  Medical History   Past Medical History:  Diagnosis Date   Chest pain    Diabetes mellitus    DM2 (diabetes mellitus, type 2) (HCC)    GERD (gastroesophageal reflux disease)    HLD (hyperlipidemia)    HTN (hypertension)    PAF (paroxysmal atrial fibrillation) ([place])    Palpitations    Significant Hospital Events: Including procedures, antibiotic start and stop dates in addition to other pertinent events   9/8 thoracentesis with return of 1.2 L of transudative fluid 9/12 increasing oxygen requirements with up to 6 L high flow nasal cannula and episodes of hypotension with systolic of 89.  Albumin, midodrine, and tube feeds started. 9/13 PCCM consulted, Eliquis held for planned thoracentesis tomorrow. CXR with stable pulm edema and pleural effusions 9/14 increased O2 requirements, stable on BiPAP now. Bilateral chest tubes placed with return of 6 L of yellow fluid. Off BiPAP 9/15 doing  well off BiPAP overnight, on 5L [place], slowed drainage from bilat chest tubes, drastically improved peripheral edema.  9/16 off bipap. Remains off pressors. Stable to transfer out of ICU   Interim History / Subjective:  SBP in the upper 70's overnight, MAP now > 65 with SBP in 90s CXR this am shows left chest tube retracted some, unchanged on right, stable right apical ptx on right on waterseal.   No events per RN with chest tube  Objective   Blood pressure 90/63, pulse 99, temperature 98.1 F (36.7 C), temperature source Axillary, resp. rate 18, height '5\' 10"'$  (1.778 m), weight 76.1 kg, SpO2 97 %.        Intake/Output Summary (Last 24 hours) at 06/27/2022 1045 Last data filed at 06/27/2022 1000 Gross per 24 hour  Intake 2423 ml  Output 2770 ml  Net -347 ml   Filed Weights   06/24/22 0500 06/26/22 0414 06/27/22 0347  Weight: 90 kg 76.5 kg 76.1 kg   Examination: General:  elderly chronically ill male lying in bed in NAD HEENT: pupils 4/reactive, cortrak Neuro:  responds intermittently to physical movement or starts yelling with noxious stimuli.  Does not track me or follow commands CV: irir PULM:  protecting airway, nonlabored, clear anteriorly, no wheeze, left pigtail CT to waterseal with serous drainage, right pigtail to waterseal with same, no airleak, both flush easily, currently on 2L [place] GI:  distended, soft, +bs, moans with palpation  Extremities: warm/dry, +1 RLE edema with venous stasis changes, left AKA Skin: scattered bruising to arms   afebrile Left CT> 140 ml/  24hr Right CT> 153m/ 24hrs  Labs and CXR personally reviewed -2.3L/ 24hrs Net -19.4L  Resolved Hospital Problem list     Assessment & Plan:   Acute respiratory failure with hypoxia  Bilateral pleural effusion, transudate  R apical ptx, small Pulmonary edema  P - clamp both chest tubes today.  If develops resp distress/ hypoxia, obtain stat CXR.  Otherwise will repeat CXR in am to look at small right  apical ptx and appearance of pleural effusions.  If stable, likely d/c both chest tubes  - consider possibly holding or reducing lasix today with slight contraction alkalosis and Na up and given hypotension overnight   Portal Hypertension Cirrhosis, ascites, pleural effusion, volume overload - Cirrhosis first documented 2015 on CT imaging. P - Cont Lactulose and CTX for SBP ppx - Diuretics as above - Ultrasound liver 9/17> cirrhotic morphology, s/p cholecystectomy, trace ascites, incidental right pleural effusion   Acute metabolic encephalopathy Possible hypoactive ICU delirium P - Delirium precautions - Consider decreasing/ holding elavil with mental status   Paroxysmal Afib, rate controlled  RV dysfunction  -CHA2DS2-VASc score of 4.   P - Eliquis, amiodarone - metoprolol being held given low SBP  Hypotension Hx HTN  P - continue midodrine, consider increasing vs holding diuretics  - MAP goal > 65    Chronic anemia - PRN CBC   GERD - PPI   Hyperlipidemia - statin  Malnutrition Debility Deconditioning  - not eating given mental status, cortrak remains   Best Practice (right click and "Reselect all SmartList Selections" daily)   Diet/type: tubefeeds per cortrak DVT prophylaxis: DOAC GI prophylaxis: PPI Lines: N/A Foley:  N/A Code Status:  full code Last date of multidisciplinary goals of care discussion [9/14]   Labs   CBC: Recent Labs  Lab 06/23/22 0509 06/24/22 0604 06/24/22 1128 06/25/22 0621 06/26/22 0120 06/27/22 0251  WBC 10.2 9.9  --  6.4 6.7 6.3  NEUTROABS  --  7.1  --   --   --   --   HGB 10.8* 10.4* 10.5* 10.8* 11.5* 10.8*  HCT 33.0* 30.8* 31.0* 31.8* 33.7* 33.3*  MCV 103.1* 100.7*  --  100.6* 100.3* 101.8*  PLT 125* 140*  --  PLATELET CLUMPS NOTED ON SMEAR, COUNT APPEARS DECREASED 147* PLATELET CLUMPS NOTED ON SMEAR, UNABLE TO ESTIMATE    Basic Metabolic Panel: Recent Labs  Lab 06/21/22 1904 06/22/22 0343 06/22/22 1659  06/23/22 0509 06/23/22 1300 06/24/22 0604 06/24/22 1128 06/24/22 1646 06/25/22 0621 06/26/22 0120 06/27/22 0251  NA  --  139  --  140   < > 141 143 142 143 144 146*  K  --  3.8  --  3.7   < > 3.5 3.0* 2.8* 3.8 3.2* 3.0*  CL  --  106  --  107   < > 103  --  100 101 101 103  CO2  --  26  --  27   < > 25  --  32 31 32 36*  GLUCOSE  --  130*  --  139*   < > 81  --  119* 121* 141* 148*  BUN  --  12  --  18   < > 21  --  22 22 25* 26*  CREATININE  --  0.64  --  0.65   < > 0.81  --  0.76 0.85 0.87 0.88  CALCIUM  --  8.6*  --  8.9   < > 9.1  --  9.1  9.0 9.2 9.3  MG 2.2 2.1 2.0 2.0  --   --   --   --  2.2 2.1 2.2  PHOS 3.5 3.2 3.0 2.7  --   --   --   --   --   --   --    < > = values in this interval not displayed.   GFR: Estimated Creatinine Clearance: 73.7 mL/min (by C-G formula based on SCr of 0.88 mg/dL). Recent Labs  Lab 06/24/22 0604 06/25/22 0621 06/26/22 0120 06/27/22 0251  WBC 9.9 6.4 6.7 6.3    Liver Function Tests: Recent Labs  Lab 06/22/22 0343 06/27/22 0251  AST 36 97*  ALT 28 54*  ALKPHOS 195* 209*  BILITOT 1.3* 0.9  PROT 6.2* 5.3*  ALBUMIN 2.6* 2.1*   No results for input(s): "LIPASE", "AMYLASE" in the last 168 hours. Recent Labs  Lab 06/24/22 0931 06/26/22 1050  AMMONIA 25 20    ABG    Component Value Date/Time   PHART 7.427 06/24/2022 1128   PCO2ART 47.8 06/24/2022 1128   PO2ART 78 (L) 06/24/2022 1128   HCO3 31.5 (H) 06/24/2022 1128   TCO2 33 (H) 06/24/2022 1128   O2SAT 95 06/24/2022 1128     Coagulation Profile: No results for input(s): "INR", "PROTIME" in the last 168 hours.  Cardiac Enzymes: No results for input(s): "CKTOTAL", "CKMB", "CKMBINDEX", "TROPONINI" in the last 168 hours.  HbA1C: Hgb A1c MFr Bld  Date/Time Value Ref Range Status  04/19/2022 10:39 AM 4.8 <5.7 % of total Hgb Final    Comment:    For the purpose of screening for the presence of diabetes: . <5.7%       Consistent with the absence of diabetes 5.7-6.4%     Consistent with increased risk for diabetes             (prediabetes) > or =6.5%  Consistent with diabetes . This assay result is consistent with a decreased risk of diabetes. . Currently, no consensus exists regarding use of hemoglobin A1c for diagnosis of diabetes in children. . According to American Diabetes Association (ADA) guidelines, hemoglobin A1c <7.0% represents optimal control in non-pregnant diabetic patients. Different metrics may apply to specific patient populations.  Standards of Medical Care in Diabetes(ADA). Marland Kitchen   03/24/2021 10:52 AM 5.6 <5.7 % of total Hgb Final    Comment:    For the purpose of screening for the presence of diabetes: . <5.7%       Consistent with the absence of diabetes 5.7-6.4%    Consistent with increased risk for diabetes             (prediabetes) > or =6.5%  Consistent with diabetes . This assay result is consistent with a decreased risk of diabetes. . Currently, no consensus exists regarding use of hemoglobin A1c for diagnosis of diabetes in children. . According to American Diabetes Association (ADA) guidelines, hemoglobin A1c <7.0% represents optimal control in non-pregnant diabetic patients. Different metrics may apply to specific patient populations.  Standards of Medical Care in Diabetes(ADA). .     CBG: Recent Labs  Lab 06/26/22 1730 06/26/22 2049 06/27/22 0338 06/27/22 0405 06/27/22 [place], MSN, AG-ACNP-BC [facility] 06/27/2022, 10:45 AM  See Amion for pager If no response to pager, please call PCCM consult pager After 7:00 pm call Elink

## 2022-06-27 NOTE — Progress Notes (Signed)
Refused cpap.

## 2022-06-27 NOTE — Progress Notes (Signed)
Pt has had numerous loose liquid stools d/t lactulose and tube feedings. Order received for flexiseal and was placed to help prevent breakdown.  Raelyn Number, RN
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
## 2022-06-27 NOTE — Plan of Care (Signed)
  Problem: Clinical Measurements: Goal: Respiratory complications will improve Outcome: Progressing Goal: Cardiovascular complication will be avoided Outcome: Progressing   Problem: Education: Goal: Knowledge of General Education information will improve Description: Including pain rating scale, medication(s)/side effects and non-pharmacologic comfort measures Outcome: Not Progressing   Problem: Health Behavior/Discharge Planning: Goal: Ability to manage health-related needs will improve Outcome: Not Progressing   Problem: Activity: Goal: Risk for activity intolerance will decrease Outcome: Not Progressing

## 2022-06-28 ENCOUNTER — Inpatient Hospital Stay (HOSPITAL_COMMUNITY): Payer: Medicare Other

## 2022-06-28 DIAGNOSIS — J90 Pleural effusion, not elsewhere classified: Secondary | ICD-10-CM | POA: Diagnosis not present

## 2022-06-28 DIAGNOSIS — J9601 Acute respiratory failure with hypoxia: Secondary | ICD-10-CM | POA: Diagnosis not present

## 2022-06-28 DIAGNOSIS — D649 Anemia, unspecified: Secondary | ICD-10-CM | POA: Diagnosis not present

## 2022-06-28 DIAGNOSIS — G9340 Encephalopathy, unspecified: Secondary | ICD-10-CM | POA: Diagnosis not present

## 2022-06-28 LAB — CBC
HCT: 33.1 % — ABNORMAL LOW (ref 39.0–52.0)
Hemoglobin: 10.9 g/dL — ABNORMAL LOW (ref 13.0–17.0)
MCH: 33.6 pg (ref 26.0–34.0)
MCHC: 32.9 g/dL (ref 30.0–36.0)
MCV: 102.2 fL — ABNORMAL HIGH (ref 80.0–100.0)
Platelets: UNDETERMINED 10*3/uL (ref 150–400)
RBC: 3.24 MIL/uL — ABNORMAL LOW (ref 4.22–5.81)
RDW: 14 % (ref 11.5–15.5)
WBC: 7.5 10*3/uL (ref 4.0–10.5)
nRBC: 0 % (ref 0.0–0.2)

## 2022-06-28 LAB — GLUCOSE, CAPILLARY
Glucose-Capillary: 76 mg/dL (ref 70–99)
Glucose-Capillary: 160 mg/dL — ABNORMAL HIGH (ref 70–99)
Glucose-Capillary: 127 mg/dL — ABNORMAL HIGH (ref 70–99)
Glucose-Capillary: 128 mg/dL — ABNORMAL HIGH (ref 70–99)
Glucose-Capillary: 168 mg/dL — ABNORMAL HIGH (ref 70–99)

## 2022-06-28 LAB — BASIC METABOLIC PANEL
Anion gap: 4 — ABNORMAL LOW (ref 5–15)
BUN: 27 mg/dL — ABNORMAL HIGH (ref 8–23)
CO2: 35 mmol/L — ABNORMAL HIGH (ref 22–32)
Calcium: 9.1 mg/dL (ref 8.9–10.3)
Chloride: 106 mmol/L (ref 98–111)
Creatinine, Ser: 0.85 mg/dL (ref 0.61–1.24)
GFR, Estimated: >60 mL/min (ref >60)
Glucose, Bld: 140 mg/dL — ABNORMAL HIGH (ref 70–99)
Potassium: 4 mmol/L (ref 3.5–5.1)
Sodium: 145 mmol/L (ref 135–145)

## 2022-06-28 LAB — HEPATIC FUNCTION PANEL
ALT: 88 U/L — ABNORMAL HIGH (ref 0–44)
AST: 164 U/L — ABNORMAL HIGH (ref 15–41)
Albumin: 2 g/dL — ABNORMAL LOW (ref 3.5–5.0)
Alkaline Phosphatase: 234 U/L — ABNORMAL HIGH (ref 38–126)
Bilirubin, Direct: 0.2 mg/dL (ref 0.0–0.2)
Indirect Bilirubin: 0.4 mg/dL (ref 0.3–0.9)
Total Bilirubin: 0.6 mg/dL (ref 0.3–1.2)
Total Protein: 5.3 g/dL — ABNORMAL LOW (ref 6.5–8.1)

## 2022-06-28 LAB — MAGNESIUM: Magnesium: 2.2 mg/dL (ref 1.7–2.4)

## 2022-06-28 NOTE — Progress Notes (Signed)
BL chest tubes removed intact. Pt tolerated well. O2 96% RA. HR 100, BP 104/65. No distress noted.   Raelyn Number, RN

## 2022-06-28 NOTE — Plan of Care (Signed)
  Problem: Education: Goal: Knowledge of General Education information will improve Description: Including pain rating scale, medication(s)/side effects and non-pharmacologic comfort measures 06/28/2022 2213 by Zadie Rhine, RN Outcome: Progressing 06/28/2022 2212 by Zadie Rhine, RN Outcome: Progressing   Problem: Health Behavior/Discharge Planning: Goal: Ability to manage health-related needs will improve 06/28/2022 2213 by Zadie Rhine, RN Outcome: Progressing 06/28/2022 2212 by Zadie Rhine, RN Outcome: Progressing   Problem: Clinical Measurements: Goal: Ability to maintain clinical measurements within normal limits will improve 06/28/2022 2213 by Zadie Rhine, RN Outcome: Progressing 06/28/2022 2212 by Zadie Rhine, RN Outcome: Progressing

## 2022-06-28 NOTE — Progress Notes (Signed)
PROGRESS NOTE    Darrell Horne  OXB:[id number] DOB: 06-02-1946 DOA: 06/16/2022 PCP: Sandrea Hughs, NP    Chief Complaint  Patient presents with   Altered Mental Status    Brief Narrative:  Darrell Horne is a 76 year old male with a past medical history of paroxysmal A-fib on Eliquis, left BKA, HTN, HLD, and alcoholic cirrhosis who presented to the hospital with altered mental status and was admitted on 06/16/2022.  He was found to have a large right-sided pleural effusion and his intermittently increased since August 26.  Thoracentesis on 06/17/2022 81 with 2 L of yellow fluid was found to be transudative pleural effusion.  Initially he did well after the procedure but over the past couple days has had increased oxygen requirements and episodes of hypotension.  Hypotension was treated with albumin and midodrine.  PCCM was consulted for further evaluation and management of his right-sided pleural effusion and acute hypoxic respiratory failure. Patient underwent thoracentesis with transudative fluid noted, noted to have increased O2 requirements and hypotensive, requiring albumin midodrine and tube feeds started.  Eliquis was also started.  Patient placed on diuretics.  Patient transferred out of the ICU as he no longer required BiPAP and transferred to [facility] service.  PCCM following for chest tube management.   Assessment & Plan:   Principal Problem:   Acute encephalopathy Active Problems:   PAF (paroxysmal atrial fibrillation) (HCC)   DM2 (diabetes mellitus, type 2) (HCC)   HTN (hypertension)   HLD (hyperlipidemia)   Hx of BKA, left (HCC)   Overdose by ingestion   Pleural effusion on right   Pressure injury of skin   Anemia   Protein-calorie malnutrition, severe   Acute respiratory failure with hypoxia (HCC)   Bilateral pleural effusion  #1 acute respiratory failure with hypoxia/bilateral pleural effusions status post bilateral chest tube placement/right apical pneumothorax  small/pulmonary edema -Patient status post bilateral chest tube placement per PCCM, output from chest tubes decreasing with a total of 130 cc out over the past 24 hours.  - Patient with sats of 99% on 1 L nasal cannula. -Patient started on diuretics and spironolactone with urine output of 625 cc over the past 24 hours.  Patient is -17.899L during this hospitalization. -Continue IV Lasix daily.  -Chest x-ray done this morning with bibasilar pigtail drainage catheter tips and changing position, small right apical pneumothorax appears unchanged.  Improved mild left greater than right basilar airspace opacities with likely unchanged small left pleural effusion.   -Patient being followed by pulmonary, bilateral chest tubes clamped, patient noted with some leaking around chest tube and chest tube unclamped to drain and chest tubes to be removed later on today per pulmonary recommendations.  -Pleural effusion noted to be transudative felt likely secondary to hepatic hydrothorax given patient's history of cirrhosis -PCCM following and managing chest tubes.  2.  Cirrhosis/ascites/volume overload/pleural effusion/portal hypertension -Patient noted to have cirrhosis documented on CT imaging on 2015. -Right upper quadrant ultrasound done with cirrhotic morphology of the liver, no focal lesion identified, status postcholecystectomy, trace ascites, incidental note of right pleural effusion. -Continue IV Rocephin, spironolactone, Lasix, lactulose. -Will need outpatient follow-up with GI.  3.  Acute metabolic encephalopathy/possible hypoactive ICU delirium/probable hepatic encephalopathy -Patient currently on delirium precautions. -Confusion slowly improving.  Patient more alert following commands. -Ammonia level obtained at 20 from 25.   -Continue lactulose 20 g daily per tube. -Supportive care.  4.  Paroxysmal atrial fibrillation - CHA2DS2VASC score 4 -Continue amiodarone 200 mg daily,  metoprolol 12.5 mg  daily for rate control. -Eliquis for anticoagulation. -Follow LFTs as patient on amiodarone per cardiology recommendations. -Cardiology was following but have signed off as of 06/25/2022. -Outpatient follow-up with cardiology.  5.  Hypertension -BP borderline. -Continue midodrine 3 times daily. -Continue IV Lasix daily.  -Monitor on beta-blocker as well.  6.  GERD -PPI.    7.  Hyperlipidemia -Statin.  8.  Chronic anemia -Follow H&H. -Hemoglobin stable at 10.9. -Transfusion threshold hemoglobin < 7.  9.  Malnutrition/debility/deconditioning -Continue current tube feeds. -PT/OT when patient more alert.  10.  Hypokalemia -Likely secondary to diuresis. -Repleted.  Potassium of 4.0.   -Follow.   11. Pressure injury, POA Pressure Injury 06/18/22 Buttocks Left;Medial Stage 2 -  Partial thickness loss of dermis presenting as a shallow open injury with a red, pink wound bed without slough. blister that burst (Active)  06/18/22 0700  Location: Buttocks  Location Orientation: Left;Medial  Staging: Stage 2 -  Partial thickness loss of dermis presenting as a shallow open injury with a red, pink wound bed without slough.  Wound Description (Comments): blister that burst  Present on Admission: Yes        DVT prophylaxis: Eliquis Code Status: Full Family Communication: No family at bedside. Disposition: TBD  Status is: Inpatient Remains inpatient appropriate because: Severity of illness   Consultants:  [place] RN 06/21/2022 PCCM: Timmothy Euler 06/22/2022 Cardiology: Dr. Angelena Form 06/22/2022 Palliative care Dr. Hilma Favors 06/23/2022  Procedures:  CT head 06/16/2022 Chest x-ray 06/16/2022, 06/21/2022, 06/22/2022, 06/23/2022, 06/24/2022, 06/26/2022 Right upper quadrant ultrasound 06/26/2022 2D echo 06/20/2022 Ultrasound-guided right thoracentesis 06/17/2022 per IR, Dr.Mir--1.2 L of yellow fluid removed Cortrack feeding tube placement 06/21/2022 Bilateral chest tube placement per PCCM, Dr. Tamala Julian  06/23/2021 Bilateral chest tubes to be removed today per PCCM pending 06/28/2022  Significant Hospital Events: Including procedures, antibiotic start and stop dates in addition to other pertinent events   9/8 thoracentesis with return of 1.2 L of transudative fluid 9/12 increasing oxygen requirements with up to 6 L high flow nasal cannula and episodes of hypotension with systolic of 89.  Albumin, midodrine, and tube feeds started. 9/13 PCCM consulted, Eliquis held for planned thoracentesis tomorrow. CXR with stable pulm edema and pleural effusions 9/14 increased O2 requirements, stable on BiPAP now. Bilateral chest tubes placed with return of 6 L of yellow fluid. Off BiPAP 9/15 doing well off BiPAP overnight, on 5L [place], slowed drainage from bilat chest tubes, drastically improved peripheral edema.  9/16 off bipap. Remains off pressors. Stable to transfer out of ICU   Antimicrobials:  Anti-infectives (From admission, onward)    Start     Dose/Rate Route Frequency Ordered Stop   06/25/22 1200  cefTRIAXone (ROCEPHIN) 1 g in sodium chloride 0.9 % 100 mL IVPB        1 g 200 mL/hr over 30 Minutes Intravenous Every 24 hours 06/25/22 1131     06/23/22 1400  ceFEPIme (MAXIPIME) 2 g in sodium chloride 0.9 % 100 mL IVPB  Status:  Discontinued        2 g 200 mL/hr over 30 Minutes Intravenous Every 8 hours 06/23/22 1045 06/25/22 1131   06/23/22 1000  ceFEPIme (MAXIPIME) 1 g in sodium chloride 0.9 % 100 mL IVPB  Status:  Discontinued        1 g 200 mL/hr over 30 Minutes Intravenous Every 12 hours 06/23/22 0529 06/23/22 1045   06/23/22 1000  linezolid (ZYVOX) tablet 600 mg  Status:  Discontinued  600 mg Per Tube Every 12 hours 06/23/22 0529 06/24/22 1100   06/22/22 1145  cefTRIAXone (ROCEPHIN) 1 g in sodium chloride 0.9 % 100 mL IVPB  Status:  Discontinued        1 g 200 mL/hr over 30 Minutes Intravenous Every 24 hours 06/22/22 1055 06/23/22 0529   06/22/22 1145  azithromycin (ZITHROMAX) 500 mg in  sodium chloride 0.9 % 250 mL IVPB  Status:  Discontinued        500 mg 250 mL/hr over 60 Minutes Intravenous Every 24 hours 06/22/22 1055 06/23/22 0529         Subjective: More alert today.  Following commands.  Confusion improving however some intermittent bouts of confusion.  Rectal tube in place with small amount of stool.  Bilateral tubes clamped awaiting for them to be removed per RN later on today.   Objective: Vitals:   06/28/22 0338 06/28/22 0800 06/28/22 1200 06/28/22 1600  BP:  '96/61 94/66 96/67 '$  Pulse:  97 93 (!) 105  Resp:  '20 20 18  '$ Temp:  98.1 F (36.7 C) 97.9 F (36.6 C) 98.1 F (36.7 C)  TempSrc:  Oral Axillary Oral  SpO2: 98% 96% 98% 98%  Weight:      Height:        Intake/Output Summary (Last 24 hours) at 06/28/2022 1708 Last data filed at 06/28/2022 1708 Gross per 24 hour  Intake 735 ml  Output 1245 ml  Net -510 ml    Filed Weights   06/26/22 0414 06/27/22 0347 06/28/22 0337  Weight: 76.5 kg 76.1 kg 77.7 kg    Examination:  General exam: Alert.  Cortrack in place.  Dry mucous membranes. Respiratory system: Decreased bibasilar crackles.  Bilateral chest tubes in place and clamped.  No wheezing noted.   Cardiovascular system: Irregularly irregular.  No JVD, no murmurs rubs or gallops.  No lower extremity edema in right leg.   Gastrointestinal system: Abdomen is soft, nontender, nondistended, positive bowel sounds.  No rebound.  No guarding.   Central nervous system: Alert.  Following commands.  Moving extremities spontaneously.  Extremities: Status post left BKA.  Skin: No rashes, lesions or ulcers Psychiatry: Judgement and insight poor to fair.  Patient alert.  Mood and affect appropriate.      Data Reviewed: I have personally reviewed following labs and imaging studies  CBC: Recent Labs  Lab 06/24/22 0604 06/24/22 1128 06/25/22 0621 06/26/22 0120 06/27/22 0251 06/28/22 0419  WBC 9.9  --  6.4 6.7 6.3 7.5  NEUTROABS 7.1  --   --   --    --   --   HGB 10.4* 10.5* 10.8* 11.5* 10.8* 10.9*  HCT 30.8* 31.0* 31.8* 33.7* 33.3* 33.1*  MCV 100.7*  --  100.6* 100.3* 101.8* 102.2*  PLT 140*  --  PLATELET CLUMPS NOTED ON SMEAR, COUNT APPEARS DECREASED 147* PLATELET CLUMPS NOTED ON SMEAR, UNABLE TO ESTIMATE PLATELET CLUMPS NOTED ON SMEAR, UNABLE TO ESTIMATE     Basic Metabolic Panel: Recent Labs  Lab 06/21/22 1904 06/21/22 1904 06/22/22 0343 06/22/22 1659 06/23/22 0509 06/23/22 1300 06/24/22 1646 06/25/22 0621 06/26/22 0120 06/27/22 0251 06/28/22 0419  NA  --    < > 139  --  140   < > 142 143 144 146* 145  K  --    < > 3.8  --  3.7   < > 2.8* 3.8 3.2* 3.0* 4.0  CL  --    < > 106  --  107   < >  100 101 101 103 106  CO2  --    < > 26  --  27   < > 32 31 32 36* 35*  GLUCOSE  --    < > 130*  --  139*   < > 119* 121* 141* 148* 140*  BUN  --    < > 12  --  18   < > 22 22 25* 26* 27*  CREATININE  --    < > 0.64  --  0.65   < > 0.76 0.85 0.87 0.88 0.85  CALCIUM  --    < > 8.6*  --  8.9   < > 9.1 9.0 9.2 9.3 9.1  MG 2.2  --  2.1 2.0 2.0  --   --  2.2 2.1 2.2 2.2  PHOS 3.5  --  3.2 3.0 2.7  --   --   --   --   --   --    < > = values in this interval not displayed.     GFR: Estimated Creatinine Clearance: 76.3 mL/min (by C-G formula based on SCr of 0.85 mg/dL).  Liver Function Tests: Recent Labs  Lab 06/22/22 0343 06/27/22 0251 06/28/22 0419  AST 36 97* 164*  ALT 28 54* 88*  ALKPHOS 195* 209* 234*  BILITOT 1.3* 0.9 0.6  PROT 6.2* 5.3* 5.3*  ALBUMIN 2.6* 2.1* 2.0*     CBG: Recent Labs  Lab 06/27/22 2324 06/28/22 0321 06/28/22 0827 06/28/22 1236 06/28/22 1701  GLUCAP 160* 76 160* 127* 128*      Recent Results (from the past 240 hour(s))  MRSA Next Gen by PCR, Nasal     Status: None   Collection Time: 06/23/22 10:21 AM   Specimen: Nasal Mucosa; Nasal Swab  Result Value Ref Range Status   MRSA by PCR Next Gen NOT DETECTED NOT DETECTED Final    Comment: (NOTE) The GeneXpert MRSA Assay (FDA approved for  NASAL specimens only), is one component of a comprehensive MRSA colonization surveillance program. It is not intended to diagnose MRSA infection nor to guide or monitor treatment for MRSA infections. Test performance is not FDA approved in patients less than 47 years old. Performed at [facility], [place] [address] [place], [place] [address]          Radiology Studies: DG Chest Port 1 View  Result Date: 06/28/2022 CLINICAL DATA:  Pneumothorax.  Pleural effusion. EXAM: PORTABLE CHEST 1 VIEW COMPARISON:  AP chest 06/27/2022, 06/26/2022, 06/24/2022; CT chest 01/16/2012 FINDINGS: Cardiac silhouette is at the upper limits of normal size. Mediastinal contours are within normal limits. Enteric tube descends below the diaphragm and curls along the stomach, likely exiting the stomach off the inferior plane of view into the duodenum. Right basilar pigtail drainage catheter is again seen. There is less of a right ankle turned at the skin surface compared to 06/27/2022 radiograph, and is now more similar to the turned seen on 06/24/2022 radiograph. Left basilar pigtail drainage catheter is unchanged, overlying the left costophrenic angle. Small right apical pneumothorax measuring approximately 13 mm in craniocaudal dimension appears not significantly changed from prior. Mild left greater than right basilar heterogeneous airspace opacities, overall mildly improved from 06/27/2022 and definitively improved from 06/26/2022 and 06/24/2022. Likely small left pleural effusion, unchanged. No significant skeletal abnormality. Surgical screws overlie the left humeral head. Cholecystectomy clips. IMPRESSION: 1. Bibasilar pigtail drainage catheter tips appear unchanged in position. 2. Small right apical pneumothorax appears unchanged. 3. Improved  mild left-greater-than-right basilar airspace opacities with likely unchanged small left pleural effusion. Electronically Signed   By: Yvonne Kendall M.D.   On: 06/28/2022  08:25   DG CHEST PORT 1 VIEW  Result Date: 06/27/2022 CLINICAL DATA:  76 year old male presents for evaluation of chest tube, history of pleural effusion. EXAM: PORTABLE CHEST 1 VIEW COMPARISON:  June 26, 2022 FINDINGS: EKG leads project over the chest. Feeding tube remains in place tip off the field of view, beyond the distal stomach. Bilateral chest tubes remain in place the RIGHT-sided chest tube makes a RIGHT angle turn at the skin surface not frankly kinked but with change in course along the chest wall none the less. LEFT-sided chest tube has been retracted, pigtail remains within the thoracic cavity. Retraction of approximately 4 cm since the prior exam. Cardiomediastinal contours and hilar structures are stable. Small RIGHT apical pneumothorax is similar accounting for changes in projection on the current study. Small graded opacity in the RIGHT chest is accentuated by lordotic positioning and diaphragmatic contour. No LEFT-sided pneumothorax.  Trace LEFT-sided effusion. On limited assessment no acute skeletal findings. IMPRESSION: 1. Retraction of LEFT-sided chest tube, pigtail remains within the thoracic cavity. Retraction of approximately 4 cm since the prior exam. 2. Also with slight change in position of the RIGHT-sided chest tube which takes a 90 degree turn at the chest wall, correlate with tube position external to the patient and with tube function. 3. Stable small RIGHT apical pneumothorax is unchanged. 4. Suspect small bilateral pleural effusions which are grossly similar to previous imaging. These results will be called to the ordering clinician or representative by the Radiologist Assistant, and communication documented in the PACS or [employer]. Electronically Signed   By: Zetta Bills M.D.   On: 06/27/2022 08:33        Scheduled Meds:  amiodarone  200 mg Per Tube BID   amitriptyline  50 mg Per Tube QHS   apixaban  5 mg Per Tube BID   atorvastatin  40 mg Per Tube  Daily   Chlorhexidine Gluconate Cloth  6 each Topical Daily   feeding supplement (OSMOLITE 1.5 CAL)  1,000 mL Per Tube Q24H   feeding supplement (PROSource TF20)  60 mL Per Tube Daily   folic acid  1 mg Per Tube Daily   free water  100 mL Per Tube Q4H   furosemide  80 mg Intravenous Daily   lactulose  20 g Per Tube Daily   metoprolol tartrate  12.5 mg Per Tube BID   midodrine  5 mg Per Tube TID WC   multivitamin with minerals  1 tablet Per Tube Daily   mupirocin cream   Topical Daily   polyethylene glycol  17 g Per Tube Daily   spironolactone  100 mg Per Tube Daily   thiamine  100 mg Per Tube Daily   topiramate  75 mg Per Tube Daily   zinc sulfate  220 mg Per Tube Daily   Continuous Infusions:  sodium chloride 250 mL (06/28/22 1247)   cefTRIAXone (ROCEPHIN)  IV 1 g (06/28/22 1250)     LOS: 12 days    Time spent: 40 minutes    Irine Seal, MD [facility]   To contact the attending provider between 7A-7P or the covering provider during after hours 7P-7A, please log into the web site [email or web address] and access using universal [place] password for that web site. If you do not have the password, please call the hospital operator.  06/28/2022, 5:08 PM

## 2022-06-28 NOTE — Progress Notes (Signed)
Physical Therapy Treatment Patient Details Name: Darrell Horne MRN: [id number] DOB: 1946-04-10 Today's Date: 06/28/2022   History of Present Illness Patient is a 76 y/o male who presents on 9/7 from [facility] for AMS. Admitted with acute encephalopathy and respiratory failure. CXR-pleural effusion s/p thoracentesis 9/8. Recent admission 8/25-9/1 for acute toxic encephalopathy-drug toxicity/polypharmacy overdosing. PMH includes chronic A-fib on Eliquis, DM, HTN, peripheral neuropathy, left BKA.    PT Comments    Pt received supine, oriented to self only, however agreeable to PT/OT session. Pt requiring total assist of 2 to come to siting EOB and requiring total assist to maintain sitting balance with strong posterior lean. Pt with poor initiation of mobility this session, needing hand over hand cues for sequencing and frequent cues for opening eyes and general alertness. Pt with noted contractures of B knees, R>L, with poor tolerance for therex for increased ROM. Pt with soft BP throughout with positive orthostatics in sitting (see below). Pt continues to benefit from skilled PT services to progress toward functional mobility goals.   BP supine 94/69 BP Seated EOB 74/59 BP Supine recovery 98/74   Recommendations for follow up therapy are one component of a multi-disciplinary discharge planning process, led by the attending physician.  Recommendations may be updated based on patient status, additional functional criteria and insurance authorization.  Follow Up Recommendations  Skilled nursing-short term rehab (<3 hours/day) Can patient physically be transported by private vehicle: No   Assistance Recommended at Discharge Frequent or constant Supervision/Assistance  Patient can return home with the following A lot of help with bathing/dressing/bathroom;Assistance with cooking/housework;Help with stairs or ramp for entrance;Two people to help with walking and/or transfers;Direct  supervision/assist for medications management   Equipment Recommendations  None recommended by PT    Recommendations for Other Services       Precautions / Restrictions Precautions Precautions: Fall Precaution Comments: left BKA, no prosthesis in room (at [facility])     Mobility  Bed Mobility Overal bed mobility: Needs Assistance Bed Mobility: Rolling, Sidelying to Sit, Sit to Supine Rolling: Max assist, +2 for physical assistance Sidelying to sit: Total assist, +2 for physical assistance, HOB elevated   Sit to supine: Total assist, +2 for physical assistance   General bed mobility comments: total assist with hand over hand sequencing to come to sit EOB with use of ped back to scoot to EOB, max-total assist to maintain sitting EOB as pt with posterior lean    Transfers                   General transfer comment: unable    Ambulation/Gait                   Stairs             Wheelchair Mobility    Modified Rankin (Stroke Patients Only)       Balance Overall balance assessment: Needs assistance Sitting-balance support: Feet supported, Bilateral upper extremity supported Sitting balance-Leahy Scale: Zero Sitting balance - Comments: stong posterior lean needing max-total assist to maintain upright sitting Postural control: Posterior lean                                  Cognition Arousal/Alertness: Lethargic Behavior During Therapy: Flat affect Overall Cognitive Status: No family/caregiver present to determine baseline cognitive functioning Area of Impairment: Orientation, Memory, Safety/judgement, Following commands, Problem solving, Awareness  Orientation Level: Disoriented to, Place, Time, Situation   Memory: Decreased short-term memory Following Commands: Follows one step commands consistently Safety/Judgement: Decreased awareness of safety Awareness: Emergent Problem Solving: Slow processing,  Requires verbal cues, Requires tactile cues General Comments: pt able to open eyes to name, but briefly during session. able to follow simple commands with increased time, however decreased abiliy as session progressed        Exercises Other Exercises Other Exercises: long axis ER/IR RLE x20 Other Exercises: knee flexion/extension with over pressure through extension    General Comments General comments (skin integrity, edema, etc.): pt with soft BP throughout with + orthostatics in sitting, BP supine 94/69, Seated EOB 74/59 EOB, Supine recovery 98/74. Pt with noted flexion contractures of B knees R>L      Pertinent Vitals/Pain Pain Assessment Pain Assessment: Faces Faces Pain Scale: Hurts little more Breathing: normal Pain Location: L shoulder Pain Descriptors / Indicators: Discomfort Pain Intervention(s): Monitored during session, Limited activity within patient's tolerance    Home Living                          Prior Function            PT Goals (current goals can now be found in the care plan section) Acute Rehab PT Goals PT Goal Formulation: With patient/family Time For Goal Achievement: 07/04/22    Frequency    Min 2X/week      PT Plan Current plan remains appropriate    Co-evaluation PT/OT/SLP Co-Evaluation/Treatment: Yes Reason for Co-Treatment: Complexity of the patient's impairments (multi-system involvement);For patient/therapist safety PT goals addressed during session: Mobility/safety with mobility        AM-PAC PT "6 Clicks" Mobility   Outcome Measure  Help needed turning from your back to your side while in a flat bed without using bedrails?: A Lot Help needed moving from lying on your back to sitting on the side of a flat bed without using bedrails?: A Lot Help needed moving to and from a bed to a chair (including a wheelchair)?: Total Help needed standing up from a chair using your arms (e.g., wheelchair or bedside chair)?:  Total Help needed to walk in hospital room?: Total Help needed climbing 3-5 steps with a railing? : Total 6 Click Score: 8    End of Session   Activity Tolerance: Patient limited by lethargy Patient left: with call bell/phone within reach;in bed Nurse Communication: Mobility status PT Visit Diagnosis: Other abnormalities of gait and mobility (R26.89);Muscle weakness (generalized) (M62.81)     Time: 5027-7412 PT Time Calculation (min) (ACUTE ONLY): 27 min  Charges:  $Therapeutic Activity: 8-22 mins                     Lilliann Rossetti R. PTA Acute Rehabilitation Services Office: [place] 06/28/2022, 12:00 PM

## 2022-06-28 NOTE — Progress Notes (Signed)
NAME:  Darrell Horne, MRN:  [id number], DOB:  [birth year], LOS: 12 ADMISSION DATE:  06/16/2022, CONSULTATION DATE:  06/22/2022 REFERRING MD:  Dr. Louanne Belton, CHIEF COMPLAINT:  Acute Hypoxic Respiratory Failure, Pleural Effusion   History of Present Illness:  Darrell Horne is a 76 year old male with a past medical history of paroxysmal A-fib on Eliquis, left BKA, HTN, HLD, and alcoholic cirrhosis who presented to the hospital with altered mental status and was admitted on 06/16/2022.  He was found to have a large right-sided pleural effusion and his intermittently increased since August 26.  Thoracentesis on 06/17/2022 81 with 2 L of yellow fluid was found to be transudative pleural effusion.  Initially he did well after the procedure but over the past couple days has had increased oxygen requirements and episodes of hypotension.  Hypotension was treated with albumin and midodrine.  PCCM was consulted for further evaluation and management of his right-sided pleural effusion and acute hypoxic respiratory failure.  Pertinent  Medical History   Past Medical History:  Diagnosis Date   Chest pain    Diabetes mellitus    DM2 (diabetes mellitus, type 2) (HCC)    GERD (gastroesophageal reflux disease)    HLD (hyperlipidemia)    HTN (hypertension)    PAF (paroxysmal atrial fibrillation) ([place])    Palpitations    Significant Hospital Events: Including procedures, antibiotic start and stop dates in addition to other pertinent events   9/8 thoracentesis with return of 1.2 L of transudative fluid 9/12 increasing oxygen requirements with up to 6 L high flow nasal cannula and episodes of hypotension with systolic of 89.  Albumin, midodrine, and tube feeds started. 9/13 PCCM consulted, Eliquis held for planned thoracentesis tomorrow. CXR with stable pulm edema and pleural effusions 9/14 increased O2 requirements, stable on BiPAP now. Bilateral chest tubes placed with return of 6 L of yellow fluid. Off BiPAP 9/15 doing  well off BiPAP overnight, on 5L [place], slowed drainage from bilat chest tubes, drastically improved peripheral edema.  9/16 off bipap. Remains off pressors. Stable to transfer out of ICU   Interim History / Subjective:  Lactulose held due to diarrhea  More responsive today Bilateral chest tubes clamped since 9/18  Objective   Blood pressure 104/68, pulse 98, temperature 98.2 F (36.8 C), temperature source Axillary, resp. rate 20, height '5\' 10"'$  (1.778 m), weight 77.7 kg, SpO2 98 %.        Intake/Output Summary (Last 24 hours) at 06/28/2022 9628 Last data filed at 06/28/2022 0617 Gross per 24 hour  Intake 3178 ml  Output 720 ml  Net 2458 ml   Filed Weights   06/26/22 0414 06/27/22 0347 06/28/22 0337  Weight: 76.5 kg 76.1 kg 77.7 kg   Examination: General:  chronically ill appearing elderly male in NAD sitting upright in bed HEENT: pupils 4/reactive, cortrak Neuro:  Awakens to verbal, tracks, follows simple commands/ MAE weakly, attempted to verbalize CV:  irir, no murmur PULM:  non labored, clear throughout, left and right pigtail chest tubes remain clamped GI: soft, bs+, no obvious tenderness Extremities: warm/dry, left AKA, venous stasis changes of RLE Skin: arms bruised   afebrile Labs reviewed> LFTs slightly up, Na 145, bicarb 35, sCr 0.85, CBC stable CXR>   Resolved Hospital Problem list     Assessment & Plan:   Acute respiratory failure with hypoxia  Bilateral pleural effusion, transudate  R apical ptx, small Pulmonary edema  P - repeat CXR after CT clamped 9/18> small R apical ptx  stable, effusions stable - plans to d/c both chest tubes today - f/u CXR in am or sooner if develops resp distress - diuretics as BP/ renal function tolerates  - wean supplemental O2 as able/ aggressive pulmonary hygiene/ IS   Portal Hypertension Cirrhosis, ascites, pleural effusion, volume overload - Cirrhosis first documented 2015 on CT imaging. - Ultrasound liver 9/17> cirrhotic  morphology, s/p cholecystectomy, trace ascites, incidental right pleural effusion P - CTX for SBP ppx, lactulose on hold for diarrhea - Diuretic per primary team  Acute metabolic encephalopathy Possible hypoactive ICU delirium P - Delirium precautions - improved mental status today   Paroxysmal Afib, rate controlled  RV dysfunction  -CHA2DS2-VASc score of 4.   P - Eliquis, amiodarone, metoprolol  - remains rate controlled  Hypotension Hx HTN  P - continue midodrine - MAP goal > 65    Chronic anemia - CBC stable   GERD - PPI   Hyperlipidemia - statin  Malnutrition Debility Deconditioning  - TF per cortak, diet as mental status allows - PT   Best Practice (right click and "Reselect all SmartList Selections" daily)   Diet/type: tubefeeds per cortrak DVT prophylaxis: DOAC GI prophylaxis: PPI Lines: N/A Foley:  N/A Code Status:  full code Last date of multidisciplinary goals of care discussion [9/14]   Labs   CBC: Recent Labs  Lab 06/24/22 0604 06/24/22 1128 06/25/22 0621 06/26/22 0120 06/27/22 0251 06/28/22 0419  WBC 9.9  --  6.4 6.7 6.3 7.5  NEUTROABS 7.1  --   --   --   --   --   HGB 10.4* 10.5* 10.8* 11.5* 10.8* 10.9*  HCT 30.8* 31.0* 31.8* 33.7* 33.3* 33.1*  MCV 100.7*  --  100.6* 100.3* 101.8* 102.2*  PLT 140*  --  PLATELET CLUMPS NOTED ON SMEAR, COUNT APPEARS DECREASED 147* PLATELET CLUMPS NOTED ON SMEAR, UNABLE TO ESTIMATE PLATELET CLUMPS NOTED ON SMEAR, UNABLE TO ESTIMATE    Basic Metabolic Panel: Recent Labs  Lab 06/21/22 1904 06/22/22 0343 06/22/22 1659 06/23/22 0509 06/23/22 1300 06/24/22 1646 06/25/22 0621 06/26/22 0120 06/27/22 0251 06/28/22 0419  NA  --  139  --  140   < > 142 143 144 146* 145  K  --  3.8  --  3.7   < > 2.8* 3.8 3.2* 3.0* 4.0  CL  --  106  --  107   < > 100 101 101 103 106  CO2  --  26  --  27   < > 32 31 32 36* 35*  GLUCOSE  --  130*  --  139*   < > 119* 121* 141* 148* 140*  BUN  --  12  --  18   < > 22  22 25* 26* 27*  CREATININE  --  0.64  --  0.65   < > 0.76 0.85 0.87 0.88 0.85  CALCIUM  --  8.6*  --  8.9   < > 9.1 9.0 9.2 9.3 9.1  MG 2.2 2.1 2.0 2.0  --   --  2.2 2.1 2.2 2.2  PHOS 3.5 3.2 3.0 2.7  --   --   --   --   --   --    < > = values in this interval not displayed.   GFR: Estimated Creatinine Clearance: 76.3 mL/min (by C-G formula based on SCr of 0.85 mg/dL). Recent Labs  Lab 06/25/22 0621 06/26/22 0120 06/27/22 0251 06/28/22 0419  WBC 6.4 6.7 6.3 7.5  Liver Function Tests: Recent Labs  Lab 06/22/22 0343 06/27/22 0251 06/28/22 0419  AST 36 97* 164*  ALT 28 54* 88*  ALKPHOS 195* 209* 234*  BILITOT 1.3* 0.9 0.6  PROT 6.2* 5.3* 5.3*  ALBUMIN 2.6* 2.1* 2.0*   No results for input(s): "LIPASE", "AMYLASE" in the last 168 hours. Recent Labs  Lab 06/24/22 0931 06/26/22 1050  AMMONIA 25 20    ABG    Component Value Date/Time   PHART 7.427 06/24/2022 1128   PCO2ART 47.8 06/24/2022 1128   PO2ART 78 (L) 06/24/2022 1128   HCO3 31.5 (H) 06/24/2022 1128   TCO2 33 (H) 06/24/2022 1128   O2SAT 95 06/24/2022 1128     Coagulation Profile: No results for input(s): "INR", "PROTIME" in the last 168 hours.  Cardiac Enzymes: No results for input(s): "CKTOTAL", "CKMB", "CKMBINDEX", "TROPONINI" in the last 168 hours.  HbA1C: Hgb A1c MFr Bld  Date/Time Value Ref Range Status  04/19/2022 10:39 AM 4.8 <5.7 % of total Hgb Final    Comment:    For the purpose of screening for the presence of diabetes: . <5.7%       Consistent with the absence of diabetes 5.7-6.4%    Consistent with increased risk for diabetes             (prediabetes) > or =6.5%  Consistent with diabetes . This assay result is consistent with a decreased risk of diabetes. . Currently, no consensus exists regarding use of hemoglobin A1c for diagnosis of diabetes in children. . According to American Diabetes Association (ADA) guidelines, hemoglobin A1c <7.0% represents optimal control in  non-pregnant diabetic patients. Different metrics may apply to specific patient populations.  Standards of Medical Care in Diabetes(ADA). Horne Kitchen   03/24/2021 10:52 AM 5.6 <5.7 % of total Hgb Final    Comment:    For the purpose of screening for the presence of diabetes: . <5.7%       Consistent with the absence of diabetes 5.7-6.4%    Consistent with increased risk for diabetes             (prediabetes) > or =6.5%  Consistent with diabetes . This assay result is consistent with a decreased risk of diabetes. . Currently, no consensus exists regarding use of hemoglobin A1c for diagnosis of diabetes in children. . According to American Diabetes Association (ADA) guidelines, hemoglobin A1c <7.0% represents optimal control in non-pregnant diabetic patients. Different metrics may apply to specific patient populations.  Standards of Medical Care in Diabetes(ADA). .     CBG: Recent Labs  Lab 06/27/22 1711 06/27/22 2004 06/27/22 2009 06/27/22 2324 06/28/22 0321  GLUCAP 99 <10* 122* 160* 47       Kennieth Rad, MSN, AG-ACNP-BC [facility] 06/28/2022, 8:12 AM  See Amion for pager If no response to pager, please call PCCM consult pager After 7:00 pm call Elink

## 2022-06-28 NOTE — Progress Notes (Signed)
CMT called to report pt noted to have 14 beats of V-tach and prior had 7 run of V-tach or wide QRS. MD made aware and orders received. Pt presents asymptomatic and sleeping with O2 on at 2 liters. SRP,RN

## 2022-06-28 NOTE — Plan of Care (Signed)

## 2022-06-28 NOTE — TOC Progression Note (Signed)
Transition of Care [facility]) - Progression Note    Patient Details  Name: Darrell Horne MRN: [id number] Date of Birth: October 18, 1945  Transition of Care [facility]) CM/SW [place], [place] Phone Number: 06/28/2022, 11:35 AM  Clinical Narrative:     TOC continues to follow and assist with discharge planning.  Thurmond Butts, MSW, LCSW Clinical Social Worker    Expected Discharge Plan: Skilled Nursing Facility Barriers to Discharge: Continued Medical Work up, Ship broker  Expected Discharge Plan and Services Expected Discharge Plan: [place] Choice: [place] arrangements for the past 2 months: Apartment                                       Social Determinants of Health (SDOH) Interventions    Readmission Risk Interventions     No data to display

## 2022-06-28 NOTE — Progress Notes (Signed)
Occupational Therapy Treatment Patient Details Name: Darrell Horne MRN: [id number] DOB: [birth year] Today's Date: 06/28/2022   History of present illness Patient is a 76 y/o male who presents on 9/7 from [facility] for AMS. Admitted with acute encephalopathy and respiratory failure. CXR-pleural effusion s/p thoracentesis 9/8. 9/14 increased O2 requirements with Bil chest tubes places. Recent admission 8/25-9/1 for acute toxic encephalopathy-drug toxicity/polypharmacy overdosing. PMH includes chronic A-fib on Eliquis, DM, HTN, peripheral neuropathy, left BKA.   OT comments  Pt supine in bed and agreeable to OT/PT session, pt lethargic and requires increased time to respond.  Supine engaged in UE exercises with AAROM, washing face with mod assist due to weakness.  Transitioned to EOB with max-total assist +2, heavy posterior lean and poor tolerance.  BP soft throughout session, but + orthostatic sitting EOB. Pt following minimal commands with increased time and repetition. Will follow acutely, SNF remains appropriate.   BP supine:  94/69  BP sitting: 74/59 BP supine: 98/74   Recommendations for follow up therapy are one component of a multi-disciplinary discharge planning process, led by the attending physician.  Recommendations may be updated based on patient status, additional functional criteria and insurance authorization.    Follow Up Recommendations  Skilled nursing-short term rehab (<3 hours/day)    Assistance Recommended at Discharge Frequent or constant Supervision/Assistance  Patient can return home with the following  Two people to help with walking and/or transfers;Direct supervision/assist for medications management;Direct supervision/assist for financial management;Assistance with cooking/housework;Assist for transportation;Two people to help with bathing/dressing/bathroom;Help with stairs or ramp for entrance   Equipment Recommendations  Other (comment) (defer)     Recommendations for Other Services      Precautions / Restrictions Precautions Precautions: Fall Precaution Comments: left BKA, no prosthesis in room (at [facility]), bil chest tubes Restrictions Weight Bearing Restrictions: No       Mobility Bed Mobility Overal bed mobility: Needs Assistance Bed Mobility: Rolling, Sidelying to Sit, Sit to Supine Rolling: Max assist, +2 for physical assistance Sidelying to sit: Total assist, +2 for physical assistance, HOB elevated   Sit to supine: Total assist, +2 for physical assistance   General bed mobility comments: total assist with hand over hand sequencing to come to sit EOB with use of ped back to scoot to EOB, max-total assist to maintain sitting EOB as pt with posterior lean    Transfers                   General transfer comment: unable     Balance Overall balance assessment: Needs assistance Sitting-balance support: Feet supported, Bilateral upper extremity supported Sitting balance-Leahy Scale: Zero Sitting balance - Comments: stong posterior lean needing max-total assist to maintain upright sitting Postural control: Posterior lean                                 ADL either performed or assessed with clinical judgement   ADL Overall ADL's : Needs assistance/impaired     Grooming: Moderate assistance;Bed level Grooming Details (indicate cue type and reason): pt able to initate task, requires mod support at elbow due to weakness             Lower Body Dressing: Total assistance;Bed level               Functional mobility during ADLs: Total assistance;+2 for physical assistance;+2 for safety/equipment General ADL Comments: limited to EOB    Extremity/Trunk Assessment  Vision       Perception     Praxis      Cognition Arousal/Alertness: Lethargic Behavior During Therapy: Flat affect Overall Cognitive Status: No family/caregiver present to determine baseline  cognitive functioning Area of Impairment: Orientation, Memory, Safety/judgement, Following commands, Problem solving, Awareness, Attention                 Orientation Level: Disoriented to, Place, Time, Situation Current Attention Level: Focused Memory: Decreased recall of precautions, Decreased short-term memory Following Commands: Follows one step commands inconsistently, Follows one step commands with increased time Safety/Judgement: Decreased awareness of safety, Decreased awareness of deficits Awareness: Intellectual Problem Solving: Slow processing, Difficulty sequencing, Decreased initiation, Requires verbal cues, Requires tactile cues General Comments: pt able to open eyes to name initially during session, fatigues easily. Initates washing face, squeezing hands but otherwise not following simple commands.        Exercises Exercises: General Upper Extremity General Exercises - Upper Extremity Shoulder Flexion: AAROM, 5 reps, Both, Supine Shoulder Extension: AAROM, Both, 5 reps, Supine    Shoulder Instructions       General Comments BP soft throughout session with + orthostatics in sitting (supine 94/69 EOB 74/59 and supine recovery 98/74)    Pertinent Vitals/ Pain       Pain Assessment Pain Assessment: Faces Faces Pain Scale: Hurts little more Pain Location: L shoulder with movement Pain Descriptors / Indicators: Discomfort Pain Intervention(s): Monitored during session, Limited activity within patient's tolerance, Repositioned  Home Living                                          Prior Functioning/Environment              Frequency  Min 2X/week        Progress Toward Goals  OT Goals(current goals can now be found in the care plan section)  Progress towards OT goals: Not progressing toward goals - comment  Acute Rehab OT Goals Patient Stated Goal: none stated OT Goal Formulation: Patient unable to participate in goal  setting Time For Goal Achievement: 07/05/22 Potential to Achieve Goals: [place] Discharge plan remains appropriate;Frequency remains appropriate    Co-evaluation    PT/OT/SLP Co-Evaluation/Treatment: Yes Reason for Co-Treatment: Complexity of the patient's impairments (multi-system involvement);Necessary to address cognition/behavior during functional activity;For patient/therapist safety;To address functional/ADL transfers PT goals addressed during session: Mobility/safety with mobility OT goals addressed during session: ADL's and self-care      AM-PAC OT "6 Clicks" Daily Activity     Outcome Measure   Help from another person eating meals?: Total Help from another person taking care of personal grooming?: Total Help from another person toileting, which includes using toliet, bedpan, or urinal?: Total Help from another person bathing (including washing, rinsing, drying)?: Total Help from another person to put on and taking off regular upper body clothing?: Total Help from another person to put on and taking off regular lower body clothing?: Total 6 Click Score: 6    End of Session Equipment Utilized During Treatment: Oxygen  OT Visit Diagnosis: Other abnormalities of gait and mobility (R26.89);Muscle weakness (generalized) (M62.81);Other symptoms and signs involving cognitive function   Activity Tolerance Patient limited by lethargy   Patient Left in bed;with call bell/phone within reach;with bed alarm set   Nurse Communication Mobility status        Time: 4401-0272 OT Time  Calculation (min): 27 min  Charges: OT General Charges $OT Visit: 1 Visit OT Treatments $Self Care/Home Management : 8-22 mins  [place] Office [phone]   Delight Stare 06/28/2022, 1:25 PM

## 2022-06-28 NOTE — Plan of Care (Signed)

## 2022-06-28 NOTE — Progress Notes (Signed)
Nurse reassessed pts chest tube sites. Currently orders in place to pull tube. Noted left side was leaking around dressing. BL tubes currently clamped. Notified provider who instructed to unclamp to allow draining prior to pulling tubes.  Raelyn Number, RN

## 2022-06-29 ENCOUNTER — Inpatient Hospital Stay (HOSPITAL_COMMUNITY): Payer: Medicare Other

## 2022-06-29 DIAGNOSIS — G9340 Encephalopathy, unspecified: Secondary | ICD-10-CM | POA: Diagnosis not present

## 2022-06-29 LAB — BASIC METABOLIC PANEL
Anion gap: 4 — ABNORMAL LOW (ref 5–15)
BUN: 27 mg/dL — ABNORMAL HIGH (ref 8–23)
CO2: 32 mmol/L (ref 22–32)
Calcium: 8.8 mg/dL — ABNORMAL LOW (ref 8.9–10.3)
Chloride: 105 mmol/L (ref 98–111)
Creatinine, Ser: 0.78 mg/dL (ref 0.61–1.24)
GFR, Estimated: >60 mL/min (ref >60)
Glucose, Bld: 157 mg/dL — ABNORMAL HIGH (ref 70–99)
Potassium: 4.3 mmol/L (ref 3.5–5.1)
Sodium: 141 mmol/L (ref 135–145)

## 2022-06-29 LAB — BLOOD GAS, VENOUS
Acid-Base Excess: 9.1 mmol/L — ABNORMAL HIGH (ref 0.0–2.0)
Bicarbonate: 36.3 mmol/L — ABNORMAL HIGH (ref 20.0–28.0)
Drawn by: 1405
O2 Saturation: 20.1 %
Patient temperature: 36.6
pCO2, Ven: 59 mmHg (ref 44–60)
pH, Ven: 7.4 (ref 7.25–7.43)
pO2, Ven: <31 mmHg — CL (ref 32–45)

## 2022-06-29 LAB — MAGNESIUM
Magnesium: 2.4 mg/dL (ref 1.7–2.4)
Magnesium: 2.2 mg/dL (ref 1.7–2.4)

## 2022-06-29 LAB — VITAMIN B12: Vitamin B-12: 599 pg/mL (ref 180–914)

## 2022-06-29 LAB — COMPREHENSIVE METABOLIC PANEL
ALT: 112 U/L — ABNORMAL HIGH (ref 0–44)
AST: 172 U/L — ABNORMAL HIGH (ref 15–41)
Albumin: 2 g/dL — ABNORMAL LOW (ref 3.5–5.0)
Alkaline Phosphatase: 236 U/L — ABNORMAL HIGH (ref 38–126)
Anion gap: 6 (ref 5–15)
BUN: 26 mg/dL — ABNORMAL HIGH (ref 8–23)
CO2: 31 mmol/L (ref 22–32)
Calcium: 8.7 mg/dL — ABNORMAL LOW (ref 8.9–10.3)
Chloride: 102 mmol/L (ref 98–111)
Creatinine, Ser: 0.79 mg/dL (ref 0.61–1.24)
GFR, Estimated: >60 mL/min (ref >60)
Glucose, Bld: 165 mg/dL — ABNORMAL HIGH (ref 70–99)
Potassium: 3.7 mmol/L (ref 3.5–5.1)
Sodium: 139 mmol/L (ref 135–145)
Total Bilirubin: 0.6 mg/dL (ref 0.3–1.2)
Total Protein: 5.4 g/dL — ABNORMAL LOW (ref 6.5–8.1)

## 2022-06-29 LAB — GLUCOSE, CAPILLARY
Glucose-Capillary: 103 mg/dL — ABNORMAL HIGH (ref 70–99)
Glucose-Capillary: 104 mg/dL — ABNORMAL HIGH (ref 70–99)
Glucose-Capillary: 110 mg/dL — ABNORMAL HIGH (ref 70–99)
Glucose-Capillary: 124 mg/dL — ABNORMAL HIGH (ref 70–99)
Glucose-Capillary: 138 mg/dL — ABNORMAL HIGH (ref 70–99)
Glucose-Capillary: 143 mg/dL — ABNORMAL HIGH (ref 70–99)

## 2022-06-29 LAB — CBC
HCT: 34.1 % — ABNORMAL LOW (ref 39.0–52.0)
Hemoglobin: 10.9 g/dL — ABNORMAL LOW (ref 13.0–17.0)
MCH: 32.9 pg (ref 26.0–34.0)
MCHC: 32 g/dL (ref 30.0–36.0)
MCV: 103 fL — ABNORMAL HIGH (ref 80.0–100.0)
Platelets: UNDETERMINED 10*3/uL (ref 150–400)
RBC: 3.31 MIL/uL — ABNORMAL LOW (ref 4.22–5.81)
RDW: 13.7 % (ref 11.5–15.5)
WBC: 7.5 10*3/uL (ref 4.0–10.5)
nRBC: 0 % (ref 0.0–0.2)

## 2022-06-29 LAB — AMMONIA: Ammonia: 22 umol/L (ref 9–35)

## 2022-06-29 LAB — FOLATE: Folate: 19.3 ng/mL (ref >5.9)

## 2022-06-29 MED ORDER — ALBUMIN HUMAN 25 % IV SOLN
25.0000 g | Freq: Two times a day (BID) | INTRAVENOUS | Status: DC
  Administered 2022-06-29 – 2022-07-01 (×5): 25 g via INTRAVENOUS
  Filled 2022-06-29 (×6): qty 100

## 2022-06-29 MED ORDER — THIAMINE MONONITRATE 100 MG PO TABS
100.0000 mg | ORAL_TABLET | Freq: Every day | ORAL | Status: DC
  Administered 2022-07-07 – 2022-07-08 (×2): 100 mg via ORAL
  Filled 2022-06-29 (×2): qty 1

## 2022-06-29 MED ORDER — MIDODRINE HCL 5 MG PO TABS
10.0000 mg | ORAL_TABLET | Freq: Three times a day (TID) | ORAL | Status: DC
  Administered 2022-06-30 – 2022-07-05 (×16): 10 mg
  Filled 2022-06-29 (×16): qty 2

## 2022-06-29 MED ORDER — THIAMINE HCL 100 MG/ML IJ SOLN
250.0000 mg | Freq: Every day | INTRAVENOUS | Status: AC
  Administered 2022-07-02 – 2022-07-06 (×5): 250 mg via INTRAVENOUS
  Filled 2022-06-29 (×5): qty 2.5

## 2022-06-29 MED ORDER — THIAMINE HCL 100 MG/ML IJ SOLN
500.0000 mg | Freq: Three times a day (TID) | INTRAVENOUS | Status: AC
  Administered 2022-06-29 – 2022-07-02 (×8): 500 mg via INTRAVENOUS
  Filled 2022-06-29 (×9): qty 5

## 2022-06-29 NOTE — Evaluation (Cosign Needed)
Clinical/Bedside Swallow Evaluation Darrell Horne Details  Name: Darrell Horne MRN: [id number] Date of Birth: [birth year]  Today's Date: 06/29/2022 Time: SLP Start Time (ACUTE ONLY): 0840 SLP Stop Time (ACUTE ONLY): 0853 SLP Time Calculation (min) (ACUTE ONLY): 13 min  Past Medical History:  Past Medical History:  Diagnosis Date   Chest pain    Diabetes mellitus    DM2 (diabetes mellitus, type 2) (HCC)    GERD (gastroesophageal reflux disease)    HLD (hyperlipidemia)    HTN (hypertension)    PAF (paroxysmal atrial fibrillation) (HCC)    Palpitations    Past Surgical History:  Past Surgical History:  Procedure Laterality Date   APPENDECTOMY     CHOLECYSTECTOMY     COLONOSCOPY     Dr. Michail Sermon   COLONOSCOPY WITH PROPOFOL N/A 11/06/2018   Procedure: COLONOSCOPY WITH PROPOFOL;  Surgeon: Wilford Corner, MD;  Location: [facility] ENDOSCOPY;  Service: Endoscopy;  Laterality: N/A;   HERNIA REPAIR     IR THORACENTESIS ASP PLEURAL SPACE W/IMG GUIDE  06/17/2022   LEG AMPUTATION Left [id number]   Dr. Kendell Bane   POLYPECTOMY  11/06/2018   Procedure: POLYPECTOMY;  Surgeon: Wilford Corner, MD;  Location: [facility] ENDOSCOPY;  Service: Endoscopy;;   SHOULDER SURGERY  [id number]   Dr. Meridee Score   TOTAL ANKLE ARTHROPLASTY  [id number]   HPI:  Darrell Horne is a 76 y/o male who presents on 9/7 from [facility] for AMS. Admitted with acute encephalopathy and respiratory failure. CXR-pleural effusion s/p thoracentesis 9/8. 9/14 increased O2 requirements with Bil chest tubes places. Recent admission 8/25-9/1 for acute toxic encephalopathy-drug toxicity/polypharmacy overdosing. PMH includes chronic A-fib on Eliquis, DM, HTN, peripheral neuropathy, left BKA.    Assessment / Plan / Recommendation  Clinical Impression   Pt lethargic and slightly confused throughout session. He followed simple commands provided maximal cues. Pt edentulous and was unable to reliably state whether dentures are worn during meals. Oral motor  function appeared grossly WFL, however, volitional cough presents as weak. Trials of thin liquids, puree and regular consistency attempted, no overt s/sx of aspiration noted. Pt neglected to masticate solids, and orally held the solid between lips. Recommend pt start thin liquids/puree diet until cognition improves, full supervision to control rate/volume. SLP will follow for diet advancement.   SLP Visit Diagnosis: Dysphagia, oral phase (R13.11)    Aspiration Risk  Mild aspiration risk    Diet Recommendation Dysphagia 1 (Puree);Thin liquid   Liquid Administration via: Straw;Cup Medication Administration: Whole meds with puree Supervision: Full supervision/cueing for compensatory strategies Compensations: Slow rate;Small sips/bites Postural Changes: Seated upright at 90 degrees    Other  Recommendations Oral Care Recommendations: Oral care BID    Recommendations for follow up therapy are one component of a multi-disciplinary discharge planning process, led by the attending physician.  Recommendations may be updated based on Darrell Horne status, additional functional criteria and insurance authorization.  Follow up Recommendations  (TBD)      Assistance Recommended at Discharge Frequent or constant Supervision/Assistance  Functional Status Assessment Darrell Horne has had a recent decline in their functional status and demonstrates the ability to make significant improvements in function in a reasonable and predictable amount of time.  Frequency and Duration min 2x/week  2 weeks       Prognosis Prognosis for Safe Diet Advancement: Good Barriers to Reach Goals: Cognitive deficits      Swallow Study   General Date of Onset: 06/16/22 HPI: Darrell Horne is a 76 y/o male who presents on 9/7 from [facility] for AMS. Admitted with acute encephalopathy and respiratory failure. CXR-pleural effusion s/p thoracentesis 9/8. 9/14 increased O2 requirements with Bil chest tubes places. Recent admission  8/25-9/1 for acute toxic encephalopathy-drug toxicity/polypharmacy overdosing. PMH includes chronic A-fib on Eliquis, DM, HTN, peripheral neuropathy, left BKA. Type of Study: Bedside Swallow Evaluation Previous Swallow Assessment:  (none) Diet Prior to this Study: Thin liquids;Regular Temperature Spikes Noted: No Respiratory Status: Nasal cannula History of Recent Intubation: No Behavior/Cognition: Distractible;Lethargic/Drowsy;Confused;Cooperative Oral Cavity Assessment: Dry Oral Care Completed by SLP: No Oral Cavity - Dentition: Edentulous Vision: Functional for self-feeding Self-Feeding Abilities: Needs assist Darrell Horne Positioning: Upright in bed Baseline Vocal Quality: Normal Volitional Cough: Weak Volitional Swallow: Able to elicit    Oral/Motor/Sensory Function Overall Oral Motor/Sensory Function: Within functional limits   Ice Chips Ice chips: Not tested   Thin Liquid Thin Liquid: Within functional limits Presentation: Straw    Nectar Thick Nectar Thick Liquid: Not tested   Honey Thick Honey Thick Liquid: Not tested   Puree Puree: Within functional limits Presentation: Spoon   Solid     Solid: Impaired Oral Phase Functional Implications: Oral holding      [facility] Student SLP  06/29/2022,10:29 AM

## 2022-06-29 NOTE — Progress Notes (Signed)
CXR stable, focus on diuresis.  PCCM available as needed.  Erskine Emery MD PCCM

## 2022-06-29 NOTE — Progress Notes (Addendum)
PROGRESS NOTE    Darrell Horne  KPT:[id number] DOB: April 18, 1946 DOA: 06/16/2022 PCP: Sandrea Hughs, NP  Chief Complaint  Patient presents with   Altered Mental Status    Brief Narrative:  Darrell Horne is Darrell Horne 76 year old male with Yvonda Fouty past medical history of paroxysmal Devlin Brink-fib on Eliquis, left BKA, HTN, HLD, and alcoholic cirrhosis who presented to the hospital with altered mental status and was admitted on 06/16/2022.  He was found to have Ryot Burrous large right-sided pleural effusion and his intermittently increased since August 26.  Thoracentesis on 06/17/2022 81 with 2 L of yellow fluid was found to be transudative pleural effusion.  Initially he did well after the procedure but over the past couple days has had increased oxygen requirements and episodes of hypotension.  Hypotension was treated with albumin and midodrine.  PCCM was consulted for further evaluation and management of his right-sided pleural effusion and acute hypoxic respiratory failure. Patient underwent thoracentesis with transudative fluid noted, noted to have increased O2 requirements and hypotensive, requiring albumin midodrine and tube feeds started.  Eliquis was also started.  Patient placed on diuretics.  Patient transferred out of the ICU as he no longer required BiPAP and transferred to [facility] service.  PCCM following for chest tube management.   Assessment & Plan:   Principal Problem:   Acute encephalopathy Active Problems:   PAF (paroxysmal atrial fibrillation) (HCC)   DM2 (diabetes mellitus, type 2) (HCC)   HTN (hypertension)   HLD (hyperlipidemia)   Hx of BKA, left (HCC)   Overdose by ingestion   Pleural effusion on right   Pressure injury of skin   Anemia   Protein-calorie malnutrition, severe   Acute respiratory failure with hypoxia (HCC)   Bilateral pleural effusion  Assessment and Plan: Acute Respiratory Failure with Hypoxia Bilateral Pleural Effusions Status Post Bilateral Chest Tube Placement Right  hydropneumothorax  Left Apical Pneumothorax -Patient status post bilateral chest tube placement per PCCM on 9/14 -chest tubes d/c'd 9/19 -CXR 9/20 with interval removal of bilateral chest tubes, right hydropneumothorax, query minimal new L apical pneumothorax, layering pleural effusions -Pulm recommending focus on diuresis -Continue 80 mg lasix IV daily, on spironolactone -net negative 17.9 L  -Pleural effusion noted to be transudative felt likely secondary to hepatic hydrothorax given patient's history of cirrhosis   2.  Cirrhosis/ascites/volume overload/pleural effusion/portal hypertension -Patient noted to have cirrhosis documented on CT imaging on 2015. -Right upper quadrant ultrasound done with cirrhotic morphology of the liver, no focal lesion identified, status postcholecystectomy, trace ascites, incidental note of right pleural effusion. -Continue spironolactone, Lasix, lactulose.  Stop ceftriaxone.  -Will need outpatient follow-up with GI.   3.  Acute metabolic encephalopathy/possible hypoactive ICU delirium/probable hepatic encephalopathy -Patient currently on delirium precautions. -Confusion slowly improving.  Patient more alert following commands. -b12 wnl, folate wnl, repeat ammonia today -high dose thiamine empirically   -Continue lactulose 20 g daily per tube, will discuss BM's with RN for dosing -Supportive care.   4.  Paroxysmal atrial fibrillation - CHA2DS2VASC score 4 -Continue amiodarone 200 mg daily, metoprolol 12.5 mg daily for rate control. -Eliquis for anticoagulation. -Follow LFTs as patient on amiodarone per cardiology recommendations. -Cardiology was following but have signed off as of 06/25/2022. -Outpatient follow-up with cardiology.   5.  Hypertension -BP borderline. -Continue midodrine 3 times daily -> titrate prn, will add albumin as well -Continue IV Lasix daily.  -Monitor on beta-blocker as well.   6.  GERD -PPI.     7.  Hyperlipidemia -Statin.   8.  Chronic anemia -Follow H&H. -Hemoglobin stable at 10.9. -Transfusion threshold hemoglobin < 7.   9.  Malnutrition/debility/deconditioning -Continue current tube feeds. -PT/OT when patient more alert.   10.  Hypokalemia -Likely secondary to diuresis. -Repleted.  Potassium of 4.0.   -Follow.    11. Pressure injury, POA Pressure Injury 06/18/22 Buttocks Left;Medial Stage 2 -  Partial thickness loss of dermis presenting as Waunetta Riggle shallow open injury with Hadriel Northup red, pink wound bed without slough. blister that burst (Active)  06/18/22 0700  Location: Buttocks  Location Orientation: Left;Medial  Staging: Stage 2 -  Partial thickness loss of dermis presenting as Riane Rung shallow open injury with Trea Latner red, pink wound bed without slough.  Wound Description (Comments): blister that burst  Present on Admission: Yes      DVT prophylaxis: SCD, eliquis Code Status: full Family Communication: none - called wife, no answer Disposition:   Status is: Inpatient Remains inpatient appropriate because: need for continued inpatuient care   Consultants:  [place] RN 06/21/2022 PCCM: Timmothy Euler 06/22/2022 Cardiology: Dr. Angelena Form 06/22/2022 Palliative care Dr. Hilma Favors 06/23/2022  Procedures:  Thoracentesis 9/8 Bilateral chest tube placement 9/14, removal 9/19 Cortrack placement 9/12  Antimicrobials:  Anti-infectives (From admission, onward)    Start     Dose/Rate Route Frequency Ordered Stop   06/25/22 1200  cefTRIAXone (ROCEPHIN) 1 g in sodium chloride 0.9 % 100 mL IVPB  Status:  Discontinued        1 g 200 mL/hr over 30 Minutes Intravenous Every 24 hours 06/25/22 1131 06/29/22 1010   06/23/22 1400  ceFEPIme (MAXIPIME) 2 g in sodium chloride 0.9 % 100 mL IVPB  Status:  Discontinued        2 g 200 mL/hr over 30 Minutes Intravenous Every 8 hours 06/23/22 1045 06/25/22 1131   06/23/22 1000  ceFEPIme (MAXIPIME) 1 g in sodium chloride 0.9 % 100 mL IVPB  Status:  Discontinued        1  g 200 mL/hr over 30 Minutes Intravenous Every 12 hours 06/23/22 0529 06/23/22 1045   06/23/22 1000  linezolid (ZYVOX) tablet 600 mg  Status:  Discontinued        600 mg Per Tube Every 12 hours 06/23/22 0529 06/24/22 1100   06/22/22 1145  cefTRIAXone (ROCEPHIN) 1 g in sodium chloride 0.9 % 100 mL IVPB  Status:  Discontinued        1 g 200 mL/hr over 30 Minutes Intravenous Every 24 hours 06/22/22 1055 06/23/22 0529   06/22/22 1145  azithromycin (ZITHROMAX) 500 mg in sodium chloride 0.9 % 250 mL IVPB  Status:  Discontinued        500 mg 250 mL/hr over 60 Minutes Intravenous Every 24 hours 06/22/22 1055 06/23/22 0529       Subjective: Confused, doesn't mention any pain or discomfort  Objective: Vitals:   06/28/22 2241 06/29/22 0428 06/29/22 0751 06/29/22 1110  BP: 1'00/60 95/70 96/65 '$ (!) 90/53  Pulse: 83 87 92 94  Resp: '16 19 20   '$ Temp: (!) 97.5 F (36.4 C) 97.7 F (36.5 C) 97.9 F (36.6 C)   TempSrc: Axillary Oral Oral Oral  SpO2: 96% 99% 96%   Weight:      Height:        Intake/Output Summary (Last 24 hours) at 06/29/2022 1540 Last data filed at 06/28/2022 2055 Gross per 24 hour  Intake --  Output 450 ml  Net -450 ml   Filed Weights   06/26/22 0414 06/27/22  2633 06/28/22 0337  Weight: 76.5 kg 76.1 kg 77.7 kg    Examination:  General exam: Appears calm and comfortable  Respiratory system: unlabored Cardiovascular system: RRR Gastrointestinal system: Abdomen is nondistended, soft and nontender.  Central nervous system: encephalopathic, difficult to test asterixis Extremities: dependent edema, in thighs more notably than distal extremities   Data Reviewed: I have personally reviewed following labs and imaging studies  CBC: Recent Labs  Lab 06/24/22 0604 06/24/22 1128 06/25/22 0621 06/26/22 0120 06/27/22 0251 06/28/22 0419 06/29/22 0935  WBC 9.9  --  6.4 6.7 6.3 7.5 7.5  NEUTROABS 7.1  --   --   --   --   --   --   HGB 10.4*   < > 10.8* 11.5* 10.8* 10.9*  10.9*  HCT 30.8*   < > 31.8* 33.7* 33.3* 33.1* 34.1*  MCV 100.7*  --  100.6* 100.3* 101.8* 102.2* 103.0*  PLT 140*  --  PLATELET CLUMPS NOTED ON SMEAR, COUNT APPEARS DECREASED 147* PLATELET CLUMPS NOTED ON SMEAR, UNABLE TO ESTIMATE PLATELET CLUMPS NOTED ON SMEAR, UNABLE TO ESTIMATE PLATELET CLUMPS NOTED ON SMEAR, UNABLE TO ESTIMATE   < > = values in this interval not displayed.    Basic Metabolic Panel: Recent Labs  Lab 06/22/22 1659 06/23/22 0509 06/23/22 1300 06/26/22 0120 06/27/22 0251 06/28/22 0419 06/29/22 0059 06/29/22 0935  NA  --  140   < > 144 146* 145 141 139  K  --  3.7   < > 3.2* 3.0* 4.0 4.3 3.7  CL  --  107   < > 101 103 106 105 102  CO2  --  27   < > 32 36* 35* 32 31  GLUCOSE  --  139*   < > 141* 148* 140* 157* 165*  BUN  --  18   < > 25* 26* 27* 27* 26*  CREATININE  --  0.65   < > 0.87 0.88 0.85 0.78 0.79  CALCIUM  --  8.9   < > 9.2 9.3 9.1 8.8* 8.7*  MG 2.0 2.0   < > 2.1 2.2 2.2 2.4 2.2  PHOS 3.0 2.7  --   --   --   --   --   --    < > = values in this interval not displayed.    GFR: Estimated Creatinine Clearance: 81.1 mL/min (by C-G formula based on SCr of 0.79 mg/dL).  Liver Function Tests: Recent Labs  Lab 06/27/22 0251 06/28/22 0419 06/29/22 0935  AST 97* 164* 172*  ALT 54* 88* 112*  ALKPHOS 209* 234* 236*  BILITOT 0.9 0.6 0.6  PROT 5.3* 5.3* 5.4*  ALBUMIN 2.1* 2.0* 2.0*    CBG: Recent Labs  Lab 06/28/22 1928 06/29/22 0021 06/29/22 0432 06/29/22 0754 06/29/22 1147  GLUCAP 168* 124* 110* 103* 143*     Recent Results (from the past 240 hour(s))  MRSA Next Gen by PCR, Nasal     Status: None   Collection Time: 06/23/22 10:21 AM   Specimen: Nasal Mucosa; Nasal Swab  Result Value Ref Range Status   MRSA by PCR Next Gen NOT DETECTED NOT DETECTED Final    Comment: (NOTE) The GeneXpert MRSA Assay (FDA approved for NASAL specimens only), is one component of Caylea Foronda comprehensive MRSA colonization surveillance program. It is not intended to  diagnose MRSA infection nor to guide or monitor treatment for MRSA infections. Test performance is not FDA approved in patients less than 57 years old. Performed at  [facility], [place] [address] [place], [place] [address]          Radiology Studies: DG Chest Port 1 View  Result Date: 06/29/2022 CLINICAL DATA:  [id number], [id number].  Pneumothorax and pleural effusions. EXAM: PORTABLE CHEST 1 VIEW COMPARISON:  Portable chest yesterday at 6:13 France Noyce.m. FINDINGS: 5:40 Kaulin Chaves.m. Bilateral basilar pigtail chest tubes have been removed in the interval. Feeding tube enters the stomach with the full intragastric course not filmed but is well inside the stomach. There is Adilynne Fitzwater questionable new minimal, 3% or less volume left pneumothorax following chest tube removal. Attention on follow-up recommended. Right hydropneumothorax with the pneumo component in the apex estimated 5% volume, the hydro component with small layering pleural effusion, is essentially unchanged as well as Jackson Coffield small left pleural effusion. The heart is slightly enlarged but the central vessels are normal caliber. There is patchy hazy consolidation or atelectasis in the lower lung fields, slightly worsened today. The upper lung fields remain clear of focal opacity. In all other respects no further changes. IMPRESSION: 1. Interval removal of bilateral chest tubes. 2. Right hydropneumothorax is not significantly changed. 3. Query minimal new left apical pneumothorax. Attention on follow-up recommended. 4. Layering pleural effusions with increased opacity in the lower lung fields consistent with atelectasis or consolidation. Electronically Signed   By: Telford Nab M.D.   On: 06/29/2022 07:56   DG Chest Port 1 View  Result Date: 06/28/2022 CLINICAL DATA:  Pneumothorax.  Pleural effusion. EXAM: PORTABLE CHEST 1 VIEW COMPARISON:  AP chest 06/27/2022, 06/26/2022, 06/24/2022; CT chest 01/16/2012 FINDINGS: Cardiac silhouette is at the upper limits of normal  size. Mediastinal contours are within normal limits. Enteric tube descends below the diaphragm and curls along the stomach, likely exiting the stomach off the inferior plane of view into the duodenum. Right basilar pigtail drainage catheter is again seen. There is less of Riccardo Holeman right ankle turned at the skin surface compared to 06/27/2022 radiograph, and is now more similar to the turned seen on 06/24/2022 radiograph. Left basilar pigtail drainage catheter is unchanged, overlying the left costophrenic angle. Small right apical pneumothorax measuring approximately 13 mm in craniocaudal dimension appears not significantly changed from prior. Mild left greater than right basilar heterogeneous airspace opacities, overall mildly improved from 06/27/2022 and definitively improved from 06/26/2022 and 06/24/2022. Likely small left pleural effusion, unchanged. No significant skeletal abnormality. Surgical screws overlie the left humeral head. Cholecystectomy clips. IMPRESSION: 1. Bibasilar pigtail drainage catheter tips appear unchanged in position. 2. Small right apical pneumothorax appears unchanged. 3. Improved mild left-greater-than-right basilar airspace opacities with likely unchanged small left pleural effusion. Electronically Signed   By: Yvonne Kendall M.D.   On: 06/28/2022 08:25        Scheduled Meds:  amiodarone  200 mg Per Tube BID   amitriptyline  50 mg Per Tube QHS   apixaban  5 mg Per Tube BID   atorvastatin  40 mg Per Tube Daily   Chlorhexidine Gluconate Cloth  6 each Topical Daily   feeding supplement (OSMOLITE 1.5 CAL)  1,000 mL Per Tube Q24H   feeding supplement (PROSource TF20)  60 mL Per Tube Daily   folic acid  1 mg Per Tube Daily   free water  100 mL Per Tube Q4H   furosemide  80 mg Intravenous Daily   lactulose  20 g Per Tube Daily   metoprolol tartrate  12.5 mg Per Tube BID   midodrine  5 mg Per Tube TID WC  multivitamin with minerals  1 tablet Per Tube Daily   mupirocin cream    Topical Daily   polyethylene glycol  17 g Per Tube Daily   spironolactone  100 mg Per Tube Daily   thiamine  100 mg Per Tube Daily   topiramate  75 mg Per Tube Daily   zinc sulfate  220 mg Per Tube Daily   Continuous Infusions:  sodium chloride 250 mL (06/28/22 1247)     LOS: 13 days    Time spent: over 30 min    Fayrene Helper, MD [facility]   To contact the attending provider between 7A-7P or the covering provider during after hours 7P-7A, please log into the web site [email or web address] and access using universal [place] password for that web site. If you do not have the password, please call the hospital operator.  06/29/2022, 3:40 PM

## 2022-06-30 ENCOUNTER — Inpatient Hospital Stay (HOSPITAL_COMMUNITY): Payer: Medicare Other

## 2022-06-30 DIAGNOSIS — G9340 Encephalopathy, unspecified: Secondary | ICD-10-CM | POA: Diagnosis not present

## 2022-06-30 LAB — CBC WITH DIFFERENTIAL/PLATELET
Abs Immature Granulocytes: 0.06 10*3/uL (ref 0.00–0.07)
Basophils Absolute: 0 10*3/uL (ref 0.0–0.1)
Basophils Relative: 0 %
Eosinophils Absolute: 0.3 10*3/uL (ref 0.0–0.5)
Eosinophils Relative: 4 %
HCT: 31.3 % — ABNORMAL LOW (ref 39.0–52.0)
Hemoglobin: 10.5 g/dL — ABNORMAL LOW (ref 13.0–17.0)
Immature Granulocytes: 1 %
Lymphocytes Relative: 17 %
Lymphs Abs: 1.2 10*3/uL (ref 0.7–4.0)
MCH: 33.5 pg (ref 26.0–34.0)
MCHC: 33.5 g/dL (ref 30.0–36.0)
MCV: 100 fL (ref 80.0–100.0)
Monocytes Absolute: 0.8 10*3/uL (ref 0.1–1.0)
Monocytes Relative: 11 %
Neutro Abs: 4.6 10*3/uL (ref 1.7–7.7)
Neutrophils Relative %: 67 %
Platelets: UNDETERMINED 10*3/uL (ref 150–400)
RBC: 3.13 MIL/uL — ABNORMAL LOW (ref 4.22–5.81)
RDW: 13.9 % (ref 11.5–15.5)
Smear Review: DECREASED
WBC: 6.9 10*3/uL (ref 4.0–10.5)
nRBC: 0 % (ref 0.0–0.2)

## 2022-06-30 LAB — COMPREHENSIVE METABOLIC PANEL
ALT: 109 U/L — ABNORMAL HIGH (ref 0–44)
AST: 149 U/L — ABNORMAL HIGH (ref 15–41)
Albumin: 2.3 g/dL — ABNORMAL LOW (ref 3.5–5.0)
Alkaline Phosphatase: 240 U/L — ABNORMAL HIGH (ref 38–126)
Anion gap: 8 (ref 5–15)
BUN: 25 mg/dL — ABNORMAL HIGH (ref 8–23)
CO2: 28 mmol/L (ref 22–32)
Calcium: 9 mg/dL (ref 8.9–10.3)
Chloride: 102 mmol/L (ref 98–111)
Creatinine, Ser: 0.77 mg/dL (ref 0.61–1.24)
GFR, Estimated: >60 mL/min (ref >60)
Glucose, Bld: 138 mg/dL — ABNORMAL HIGH (ref 70–99)
Potassium: 3.7 mmol/L (ref 3.5–5.1)
Sodium: 138 mmol/L (ref 135–145)
Total Bilirubin: 0.8 mg/dL (ref 0.3–1.2)
Total Protein: 5.6 g/dL — ABNORMAL LOW (ref 6.5–8.1)

## 2022-06-30 LAB — GLUCOSE, CAPILLARY
Glucose-Capillary: 108 mg/dL — ABNORMAL HIGH (ref 70–99)
Glucose-Capillary: 125 mg/dL — ABNORMAL HIGH (ref 70–99)
Glucose-Capillary: 131 mg/dL — ABNORMAL HIGH (ref 70–99)
Glucose-Capillary: 133 mg/dL — ABNORMAL HIGH (ref 70–99)
Glucose-Capillary: 139 mg/dL — ABNORMAL HIGH (ref 70–99)
Glucose-Capillary: 147 mg/dL — ABNORMAL HIGH (ref 70–99)

## 2022-06-30 LAB — MAGNESIUM: Magnesium: 2.2 mg/dL (ref 1.7–2.4)

## 2022-06-30 LAB — PHOSPHORUS: Phosphorus: 3 mg/dL (ref 2.5–4.6)

## 2022-06-30 NOTE — Progress Notes (Signed)
PROGRESS NOTE    Darrell Horne  RXV:[id number] DOB: March 16, 1946 DOA: 06/16/2022 PCP: Sandrea Hughs, NP  Chief Complaint  Patient presents with   Altered Mental Status    Brief Narrative:  Darrell Horne is Darrell Horne 76 year old male with Darrell Horne past medical history of paroxysmal Darrell Horne-fib on Eliquis, left BKA, HTN, HLD, and alcoholic cirrhosis who presented to the hospital with altered mental status and was admitted on 06/16/2022.  He was found to have Darrell Horne large right-sided pleural effusion and his intermittently increased since August 26.  Thoracentesis on 06/17/2022 81 with 2 L of yellow fluid was found to be transudative pleural effusion.  Initially he did well after the procedure but over the past couple days has had increased oxygen requirements and episodes of hypotension.  Hypotension was treated with albumin and midodrine.  PCCM was consulted for further evaluation and management of his right-sided pleural effusion and acute hypoxic respiratory failure. Patient underwent thoracentesis with transudative fluid noted, noted to have increased O2 requirements and hypotensive, requiring albumin midodrine and tube feeds started.  Eliquis was also started.  Patient placed on diuretics.  Patient transferred out of the ICU as he no longer required BiPAP and transferred to [facility] service.  PCCM following for chest tube management.   Assessment & Plan:   Principal Problem:   Acute encephalopathy Active Problems:   PAF (paroxysmal atrial fibrillation) (HCC)   DM2 (diabetes mellitus, type 2) (HCC)   HTN (hypertension)   HLD (hyperlipidemia)   Hx of BKA, left (HCC)   Overdose by ingestion   Pleural effusion on right   Pressure injury of skin   Anemia   Protein-calorie malnutrition, severe   Acute respiratory failure with hypoxia (HCC)   Bilateral pleural effusion  Assessment and Plan: Acute Respiratory Failure with Hypoxia Bilateral Pleural Effusions Status Post Bilateral Chest Tube Placement Right  hydropneumothorax  Left Apical Pneumothorax -Patient status post bilateral chest tube placement per PCCM on 9/14 -chest tubes d/c'd 9/19 -CXR 9/20 with interval removal of bilateral chest tubes, right hydropneumothorax, query minimal new L apical pneumothorax, layering pleural effusions -Pulm recommending focus on diuresis -Continue 80 mg lasix IV daily, on spironolactone -net negative 21 L  -Pleural effusion noted to be transudative felt likely secondary to hepatic hydrothorax given patient's history of cirrhosis   2.  Cirrhosis/ascites/volume overload/pleural effusion/portal hypertension -Patient noted to have cirrhosis documented on CT imaging on 2015. -Right upper quadrant ultrasound done with cirrhotic morphology of the liver, no focal lesion identified, status postcholecystectomy, trace ascites, incidental note of right pleural effusion. -Continue spironolactone, Lasix, lactulose.  Stop ceftriaxone.  -Will need outpatient follow-up with GI.   3.  Acute metabolic encephalopathy/possible hypoactive ICU delirium/probable hepatic encephalopathy -Patient currently on delirium precautions. -Confusion slowly improving.  Patient more alert following commands. -b12 wnl, folate wnl, repeat ammonia wnl -high dose thiamine empirically   -Continue lactulose 20 g daily per tube -Supportive care.   4.  Paroxysmal atrial fibrillation - CHA2DS2VASC score 4 -Continue amiodarone 200 mg daily, metoprolol 12.5 mg daily for rate control. -Eliquis for anticoagulation. -Follow LFTs as patient on amiodarone per cardiology recommendations. -Cardiology was following but have signed off as of 06/25/2022. -Outpatient follow-up with cardiology.   5.  Hypertension -BP borderline. -Continue midodrine 3 times daily -> titrate prn, will add albumin as well -Continue IV Lasix daily.  -Monitor on beta-blocker as well.   6.  GERD -PPI.     7.  Hyperlipidemia -Statin.   8.  Chronic anemia -  Follow  H&H. -relatively stable -Transfusion threshold hemoglobin < 7.   9.  Malnutrition/debility/deconditioning -Continue current tube feeds. -PT/OT when patient more alert.   10.  Hypokalemia -Likely secondary to diuresis. -Repleted.  Potassium of 4.0.   -Follow.    11. Pressure injury, POA Pressure Injury 06/18/22 Buttocks Left;Medial Stage 2 -  Partial thickness loss of dermis presenting as Darrell Horne shallow open injury with Darrell Horne red, pink wound bed without slough. blister that burst (Active)  06/18/22 0700  Location: Buttocks  Location Orientation: Left;Medial  Staging: Stage 2 -  Partial thickness loss of dermis presenting as Darrell Horne shallow open injury with Darrell Horne red, pink wound bed without slough.  Wound Description (Comments): blister that burst  Present on Admission: Yes      DVT prophylaxis: SCD, eliquis Code Status: full Family Communication: none - called wife/sister, no answer Disposition:   Status is: Inpatient Remains inpatient appropriate because: need for continued inpatuient care   Consultants:  [place] RN 06/21/2022 PCCM: Timmothy Euler 06/22/2022 Cardiology: Dr. Angelena Form 06/22/2022 Palliative care Dr. Hilma Favors 06/23/2022  Procedures:  Thoracentesis 9/8 Bilateral chest tube placement 9/14, removal 9/19 Cortrack placement 9/12  Antimicrobials:  Anti-infectives (From admission, onward)    Start     Dose/Rate Route Frequency Ordered Stop   06/25/22 1200  cefTRIAXone (ROCEPHIN) 1 g in sodium chloride 0.9 % 100 mL IVPB  Status:  Discontinued        1 g 200 mL/hr over 30 Minutes Intravenous Every 24 hours 06/25/22 1131 06/29/22 1010   06/23/22 1400  ceFEPIme (MAXIPIME) 2 g in sodium chloride 0.9 % 100 mL IVPB  Status:  Discontinued        2 g 200 mL/hr over 30 Minutes Intravenous Every 8 hours 06/23/22 1045 06/25/22 1131   06/23/22 1000  ceFEPIme (MAXIPIME) 1 g in sodium chloride 0.9 % 100 mL IVPB  Status:  Discontinued        1 g 200 mL/hr over 30 Minutes Intravenous Every 12 hours  06/23/22 0529 06/23/22 1045   06/23/22 1000  linezolid (ZYVOX) tablet 600 mg  Status:  Discontinued        600 mg Per Tube Every 12 hours 06/23/22 0529 06/24/22 1100   06/22/22 1145  cefTRIAXone (ROCEPHIN) 1 g in sodium chloride 0.9 % 100 mL IVPB  Status:  Discontinued        1 g 200 mL/hr over 30 Minutes Intravenous Every 24 hours 06/22/22 1055 06/23/22 0529   06/22/22 1145  azithromycin (ZITHROMAX) 500 mg in sodium chloride 0.9 % 250 mL IVPB  Status:  Discontinued        500 mg 250 mL/hr over 60 Minutes Intravenous Every 24 hours 06/22/22 1055 06/23/22 0529       Subjective: Denies pain Mildly confused (improved)  Objective: Vitals:   06/30/22 1135 06/30/22 1140 06/30/22 1143 06/30/22 1545  BP: (!) 97/59 (!) 97/59 (!) 97/59 97/72  Pulse: 100 100 100 (!) 105  Resp: (!) '21 20 20 20  '$ Temp: 98.1 F (36.7 C) 98.1 F (36.7 C)  98.1 F (36.7 C)  TempSrc: Oral   Oral  SpO2: 99%  99% 95%  Weight:      Height:        Intake/Output Summary (Last 24 hours) at 06/30/2022 1559 Last data filed at 06/30/2022 1151 Gross per 24 hour  Intake 840.32 ml  Output 2300 ml  Net -1459.68 ml   Filed Weights   06/26/22 0414 06/27/22 0347 06/28/22 0337  Weight: 76.5 kg  76.1 kg 77.7 kg    Examination:  General: No acute distress. Cardiovascular: RRR Lungs: unlabored Abdomen: Soft, nontender, nondistended Neurological: alert, more oriented today, still lethargic, moving all extremities Extremities: dependent edema to thighs    Data Reviewed: I have personally reviewed following labs and imaging studies  CBC: Recent Labs  Lab 06/24/22 0604 06/24/22 1128 06/26/22 0120 06/27/22 0251 06/28/22 0419 06/29/22 0935 06/30/22 0443  WBC 9.9   < > 6.7 6.3 7.5 7.5 6.9  NEUTROABS 7.1  --   --   --   --   --  4.6  HGB 10.4*   < > 11.5* 10.8* 10.9* 10.9* 10.5*  HCT 30.8*   < > 33.7* 33.3* 33.1* 34.1* 31.3*  MCV 100.7*   < > 100.3* 101.8* 102.2* 103.0* 100.0  PLT 140*   < > 147* PLATELET  CLUMPS NOTED ON SMEAR, UNABLE TO ESTIMATE PLATELET CLUMPS NOTED ON SMEAR, UNABLE TO ESTIMATE PLATELET CLUMPS NOTED ON SMEAR, UNABLE TO ESTIMATE PLATELET CLUMPS NOTED ON SMEAR, UNABLE TO ESTIMATE   < > = values in this interval not displayed.    Basic Metabolic Panel: Recent Labs  Lab 06/27/22 0251 06/28/22 0419 06/29/22 0059 06/29/22 0935 06/30/22 0443  NA 146* 145 141 139 138  K 3.0* 4.0 4.3 3.7 3.7  CL 103 106 105 102 102  CO2 36* 35* 32 31 28  GLUCOSE 148* 140* 157* 165* 138*  BUN 26* 27* 27* 26* 25*  CREATININE 0.88 0.85 0.78 0.79 0.77  CALCIUM 9.3 9.1 8.8* 8.7* 9.0  MG 2.2 2.2 2.4 2.2 2.2  PHOS  --   --   --   --  3.0    GFR: Estimated Creatinine Clearance: 81.1 mL/min (by C-G formula based on SCr of 0.77 mg/dL).  Liver Function Tests: Recent Labs  Lab 06/27/22 0251 06/28/22 0419 06/29/22 0935 06/30/22 0443  AST 97* 164* 172* 149*  ALT 54* 88* 112* 109*  ALKPHOS 209* 234* 236* 240*  BILITOT 0.9 0.6 0.6 0.8  PROT 5.3* 5.3* 5.4* 5.6*  ALBUMIN 2.1* 2.0* 2.0* 2.3*    CBG: Recent Labs  Lab 06/30/22 0003 06/30/22 0403 06/30/22 0803 06/30/22 1149 06/30/22 1534  GLUCAP 147* 125* 139* 131* 133*     Recent Results (from the past 240 hour(s))  MRSA Next Gen by PCR, Nasal     Status: None   Collection Time: 06/23/22 10:21 AM   Specimen: Nasal Mucosa; Nasal Swab  Result Value Ref Range Status   MRSA by PCR Next Gen NOT DETECTED NOT DETECTED Final    Comment: (NOTE) The GeneXpert MRSA Assay (FDA approved for NASAL specimens only), is one component of [facility] comprehensive MRSA colonization surveillance program. It is not intended to diagnose MRSA infection nor to guide or monitor treatment for MRSA infections. Test performance is not FDA approved in patients less than 37 years old. Performed at [facility], [place] [address], [place], [place] [address]          Radiology Studies: DG CHEST PORT 1 VIEW  Result Date: 06/30/2022 CLINICAL DATA:  [id number],  pneumothorax or pleural effusions. EXAM: PORTABLE CHEST 1 VIEW COMPARISON:  Portable chest yesterday at 5:40 Tiesha Marich.m. FINDINGS: 5:16 Shadrack Brummitt.m. Minimally small left apical pneumothorax continues to be seen and is unchanged. Previously there was Maribel Luis small apical right pneumothorax with fluid having extending into the area. There are increased layering pleural effusions and overlying opacities in the lower lung fields consistent with atelectasis or consolidation. There is cardiomegaly  with increased perihilar vascular congestion and mild central interstitial edema. The upper lung fields remain clear of focal infiltrates. In all other respects no further changes. IMPRESSION: 1. Right apical pneumothorax is not seen today with pleural fluid having extended into the apical right chest with the pneumo component no longer visible. 2. Minimal left apical pneumothorax is unchanged. 3. Increased perihilar vascular congestion with mild central edema. 4. Increased pleural effusions and overlying atelectasis or consolidation. Electronically Signed   By: Telford Nab M.D.   On: 06/30/2022 06:23   DG Chest Port 1 View  Result Date: 06/29/2022 CLINICAL DATA:  [id number], [id number].  Pneumothorax and pleural effusions. EXAM: PORTABLE CHEST 1 VIEW COMPARISON:  Portable chest yesterday at 6:13 Kassondra Geil.m. FINDINGS: 5:40 Lanell Carpenter.m. Bilateral basilar pigtail chest tubes have been removed in the interval. Feeding tube enters the stomach with the full intragastric course not filmed but is well inside the stomach. There is Dexter Signor questionable new minimal, 3% or less volume left pneumothorax following chest tube removal. Attention on follow-up recommended. Right hydropneumothorax with the pneumo component in the apex estimated 5% volume, the hydro component with small layering pleural effusion, is essentially unchanged as well as Kainoah Bartosiewicz small left pleural effusion. The heart is slightly enlarged but the central vessels are normal caliber. There is patchy hazy consolidation or  atelectasis in the lower lung fields, slightly worsened today. The upper lung fields remain clear of focal opacity. In all other respects no further changes. IMPRESSION: 1. Interval removal of bilateral chest tubes. 2. Right hydropneumothorax is not significantly changed. 3. Query minimal new left apical pneumothorax. Attention on follow-up recommended. 4. Layering pleural effusions with increased opacity in the lower lung fields consistent with atelectasis or consolidation. Electronically Signed   By: Telford Nab M.D.   On: 06/29/2022 07:56        Scheduled Meds:  amiodarone  200 mg Per Tube BID   amitriptyline  50 mg Per Tube QHS   apixaban  5 mg Per Tube BID   atorvastatin  40 mg Per Tube Daily   Chlorhexidine Gluconate Cloth  6 each Topical Daily   feeding supplement (OSMOLITE 1.5 CAL)  1,000 mL Per Tube Q24H   feeding supplement (PROSource TF20)  60 mL Per Tube Daily   folic acid  1 mg Per Tube Daily   free water  100 mL Per Tube Q4H   furosemide  80 mg Intravenous Daily   lactulose  20 g Per Tube Daily   metoprolol tartrate  12.5 mg Per Tube BID   midodrine  10 mg Per Tube TID WC   multivitamin with minerals  1 tablet Per Tube Daily   mupirocin cream   Topical Daily   polyethylene glycol  17 g Per Tube Daily   spironolactone  100 mg Per Tube Daily   [START ON 07/07/2022] thiamine  100 mg Oral Daily   topiramate  75 mg Per Tube Daily   zinc sulfate  220 mg Per Tube Daily   Continuous Infusions:  sodium chloride 250 mL (06/28/22 1247)   albumin human 25 g (06/30/22 1029)   thiamine (VITAMIN B1) injection 500 mg (06/30/22 1218)   Followed by   Derrill Memo ON 07/02/2022] thiamine (VITAMIN B1) injection       LOS: 14 days    Time spent: over 30 min    Fayrene Helper, MD [facility]   To contact the attending provider between 7A-7P or the covering provider during after hours 7P-7A, please log into  the web site [email or web address] and access using universal [place]  password for that web site. If you do not have the password, please call the hospital operator.  06/30/2022, 3:59 PM

## 2022-07-01 ENCOUNTER — Inpatient Hospital Stay (HOSPITAL_COMMUNITY): Payer: Medicare Other

## 2022-07-01 DIAGNOSIS — G9340 Encephalopathy, unspecified: Secondary | ICD-10-CM | POA: Diagnosis not present

## 2022-07-01 LAB — COMPREHENSIVE METABOLIC PANEL
ALT: 83 U/L — ABNORMAL HIGH (ref 0–44)
AST: 99 U/L — ABNORMAL HIGH (ref 15–41)
Albumin: 2.8 g/dL — ABNORMAL LOW (ref 3.5–5.0)
Alkaline Phosphatase: 207 U/L — ABNORMAL HIGH (ref 38–126)
Anion gap: 5 (ref 5–15)
BUN: 22 mg/dL (ref 8–23)
CO2: 26 mmol/L (ref 22–32)
Calcium: 8.8 mg/dL — ABNORMAL LOW (ref 8.9–10.3)
Chloride: 104 mmol/L (ref 98–111)
Creatinine, Ser: 0.73 mg/dL (ref 0.61–1.24)
GFR, Estimated: >60 mL/min (ref >60)
Glucose, Bld: 149 mg/dL — ABNORMAL HIGH (ref 70–99)
Potassium: 4.2 mmol/L (ref 3.5–5.1)
Sodium: 135 mmol/L (ref 135–145)
Total Bilirubin: 0.9 mg/dL (ref 0.3–1.2)
Total Protein: 5.8 g/dL — ABNORMAL LOW (ref 6.5–8.1)

## 2022-07-01 LAB — MAGNESIUM
Magnesium: 2.3 mg/dL (ref 1.7–2.4)
Magnesium: 2.2 mg/dL (ref 1.7–2.4)

## 2022-07-01 LAB — PHOSPHORUS: Phosphorus: 2.7 mg/dL (ref 2.5–4.6)

## 2022-07-01 LAB — GLUCOSE, CAPILLARY
Glucose-Capillary: 100 mg/dL — ABNORMAL HIGH (ref 70–99)
Glucose-Capillary: 117 mg/dL — ABNORMAL HIGH (ref 70–99)
Glucose-Capillary: 130 mg/dL — ABNORMAL HIGH (ref 70–99)
Glucose-Capillary: 131 mg/dL — ABNORMAL HIGH (ref 70–99)
Glucose-Capillary: 132 mg/dL — ABNORMAL HIGH (ref 70–99)
Glucose-Capillary: 143 mg/dL — ABNORMAL HIGH (ref 70–99)
Glucose-Capillary: 152 mg/dL — ABNORMAL HIGH (ref 70–99)

## 2022-07-01 LAB — CBC WITH DIFFERENTIAL/PLATELET
Abs Immature Granulocytes: 0.06 10*3/uL (ref 0.00–0.07)
Basophils Absolute: 0 10*3/uL (ref 0.0–0.1)
Basophils Relative: 0 %
Eosinophils Absolute: 0.2 10*3/uL (ref 0.0–0.5)
Eosinophils Relative: 4 %
HCT: 30.5 % — ABNORMAL LOW (ref 39.0–52.0)
Hemoglobin: 10.2 g/dL — ABNORMAL LOW (ref 13.0–17.0)
Immature Granulocytes: 1 %
Lymphocytes Relative: 13 %
Lymphs Abs: 0.9 10*3/uL (ref 0.7–4.0)
MCH: 33.2 pg (ref 26.0–34.0)
MCHC: 33.4 g/dL (ref 30.0–36.0)
MCV: 99.3 fL (ref 80.0–100.0)
Monocytes Absolute: 0.7 10*3/uL (ref 0.1–1.0)
Monocytes Relative: 10 %
Neutro Abs: 5 10*3/uL (ref 1.7–7.7)
Neutrophils Relative %: 72 %
Platelets: 107 10*3/uL — ABNORMAL LOW (ref 150–400)
RBC: 3.07 MIL/uL — ABNORMAL LOW (ref 4.22–5.81)
RDW: 13.7 % (ref 11.5–15.5)
WBC: 6.9 10*3/uL (ref 4.0–10.5)
nRBC: 0 % (ref 0.0–0.2)

## 2022-07-01 NOTE — Progress Notes (Signed)
Occupational Therapy Treatment Patient Details Name: Darrell Horne MRN: [id number] DOB: 01/12/1946 Today's Date: 07/01/2022   History of present illness Patient is a 76 y/o male who presents on 9/7 from [place] [facility] for AMS. Admitted with acute encephalopathy and respiratory failure. CXR-pleural effusion s/p thoracentesis 9/8. 9/14 increased O2 requirements with Bil chest tubes places. Recent admission 8/25-9/1 for acute toxic encephalopathy-drug toxicity/polypharmacy overdosing. PMH includes chronic A-fib on Eliquis, DM, HTN, peripheral neuropathy, left BKA.   OT comments  Patient received in supine and able to answer simple questions. Patient was instructed on bed mobility and assist of 2 to get to EOB. Patient demonstrated strong posterior leaning and was able to assist with holding onto back of chair. Patient required rest due to back pain and fatigue and was mod assist with balance.  Patient returned to supine and was setup for breakfast with hand over hand to initiate but was able to manage utensil and cup.  Acute OT to continue to follow with discharge recommendations continue to be appropriate.    Recommendations for follow up therapy are one component of a multi-disciplinary discharge planning process, led by the attending physician.  Recommendations may be updated based on patient status, additional functional criteria and insurance authorization.    Follow Up Recommendations  Skilled nursing-short term rehab (<3 hours/day)    Assistance Recommended at Discharge Frequent or constant Supervision/Assistance  Patient can return home with the following  Two people to help with walking and/or transfers;Direct supervision/assist for medications management;Direct supervision/assist for financial management;Assistance with cooking/housework;Assist for transportation;Two people to help with bathing/dressing/bathroom;Help with stairs or ramp for entrance   Equipment Recommendations  Other  (comment) (defer)    Recommendations for Other Services      Precautions / Restrictions Precautions Precautions: Fall Precaution Comments: left BKA, no prosthesis in room (at [facility]), bil chest tubes Restrictions Weight Bearing Restrictions: Yes LLE Weight Bearing: Non weight bearing       Mobility Bed Mobility Overal bed mobility: Needs Assistance Bed Mobility: Rolling, Sidelying to Sit, Sit to Supine Rolling: Max assist, +2 for physical assistance Sidelying to sit: Mod assist, Max assist, +2 for physical assistance   Sit to supine: Max assist, +2 for physical assistance   General bed mobility comments: more alert and followed directions to assist with bed mobility but continues to require assist of 2    Transfers Overall transfer level: Needs assistance                 General transfer comment: unable     Balance Overall balance assessment: Needs assistance Sitting-balance support: Feet supported, Bilateral upper extremity supported Sitting balance-Leahy Scale: Poor Sitting balance - Comments: able to keep balance with UE support using back of chair, mod assist for sitting balance if not using UE support Postural control: Posterior lean                                 ADL either performed or assessed with clinical judgement   ADL Overall ADL's : Needs assistance/impaired Eating/Feeding: Set up;Bed level Eating/Feeding Details (indicate cue type and reason): hand over hand to iniate Grooming: Minimal assistance;Wash/dry face;Sitting Grooming Details (indicate cue type and reason): assistance to initiate and min assist to perform with verbal cues  General ADL Comments: limited to EOB    Extremity/Trunk Assessment              Vision       Perception     Praxis      Cognition Arousal/Alertness: Awake/alert Behavior During Therapy: Flat affect Overall Cognitive Status: No family/caregiver  present to determine baseline cognitive functioning Area of Impairment: Orientation, Memory, Safety/judgement, Following commands, Problem solving, Awareness, Attention                 Orientation Level: Disoriented to, Place, Time, Situation Current Attention Level: Focused Memory: Decreased recall of precautions, Decreased short-term memory Following Commands: Follows one step commands inconsistently, Follows one step commands with increased time Safety/Judgement: Decreased awareness of safety, Decreased awareness of deficits Awareness: Intellectual Problem Solving: Slow processing, Difficulty sequencing, Decreased initiation, Requires verbal cues, Requires tactile cues General Comments: patient more alert and following more commands        Exercises      Shoulder Instructions       General Comments      Pertinent Vitals/ Pain       Pain Assessment Pain Assessment: Faces Faces Pain Scale: Hurts a little bit Pain Location: back sitting on EOB Pain Descriptors / Indicators: Discomfort, Grimacing Pain Intervention(s): Limited activity within patient's tolerance, Monitored during session, Repositioned  Home Living                                          Prior Functioning/Environment              Frequency  Min 2X/week        Progress Toward Goals  OT Goals(current goals can now be found in the care plan section)  Progress towards OT goals: Progressing toward goals  Acute Rehab OT Goals OT Goal Formulation: Patient unable to participate in goal setting Time For Goal Achievement: 07/05/22 Potential to Achieve Goals: Fair ADL Goals Pt Will Perform Grooming: bed level;with min assist Pt Will Perform Lower Body Bathing: sitting/lateral leans;bed level;with set-up Pt Will Perform Upper Body Dressing: sitting;with set-up Pt Will Perform Lower Body Dressing: with adaptive equipment;with set-up;sitting/lateral leans;sit to/from stand Pt Will  Transfer to Toilet: stand pivot transfer;with modified independence;bedside commode Pt Will Perform Toileting - Clothing Manipulation and hygiene: with modified independence;with adaptive equipment Pt/caregiver will Perform Home Exercise Program: Increased strength;Both right and left upper extremity Additional ADL Goal #1: Pt will follow 2 step commands during ADLs with increased time. Additional ADL Goal #2: Pt will complete bed mobility with mod assist as precursor to decrease burden of care.  Plan Discharge plan remains appropriate;Frequency remains appropriate    Co-evaluation    PT/OT/SLP Co-Evaluation/Treatment: Yes Reason for Co-Treatment: For patient/therapist safety;To address functional/ADL transfers   OT goals addressed during session: ADL's and self-care      AM-PAC OT "6 Clicks" Daily Activity     Outcome Measure   Help from another person eating meals?: A Lot Help from another person taking care of personal grooming?: A Lot Help from another person toileting, which includes using toliet, bedpan, or urinal?: Total Help from another person bathing (including washing, rinsing, drying)?: Total Help from another person to put on and taking off regular upper body clothing?: Total Help from another person to put on and taking off regular lower body clothing?: Total 6 Click Score: 8    End of Session  Equipment Utilized During Treatment: Oxygen  OT Visit Diagnosis: Other abnormalities of gait and mobility (R26.89);Muscle weakness (generalized) (M62.81);Other symptoms and signs involving cognitive function   Activity Tolerance Patient tolerated treatment well   Patient Left in bed;with call bell/phone within reach;with bed alarm set   Nurse Communication Mobility status        Time: 3710-6269 OT Time Calculation (min): 27 min  Charges: OT General Charges $OT Visit: 1 Visit OT Treatments $Self Care/Home Management : 8-22 mins  Lodema Hong, [place]  Office [place] 07/01/2022, 11:31 AM

## 2022-07-01 NOTE — Progress Notes (Signed)
PROGRESS NOTE    Darrell Horne  XQJ:[id number] DOB: 07/13/1946 DOA: 06/16/2022 PCP: Sandrea Hughs, NP  Chief Complaint  Patient presents with   Altered Mental Status    Brief Narrative:  Darrell Horne is Darrell Horne 76 year old male with Darrell Horne past medical history of paroxysmal Darrell Horne on Eliquis, left BKA, HTN, HLD, and alcoholic cirrhosis who presented to the hospital with altered mental status and was admitted on 06/16/2022.  He was found to have Darrell Horne large right-sided pleural effusion and his intermittently increased since August 26.  Thoracentesis on 06/17/2022 81 with 2 L of yellow fluid was found to be transudative pleural effusion.  Initially he did well after the procedure but over the past couple days has had increased oxygen requirements and episodes of hypotension.  Hypotension was treated with albumin and midodrine.  PCCM was consulted for further evaluation and management of his right-sided pleural effusion and acute hypoxic respiratory failure. Patient underwent thoracentesis with transudative fluid noted, noted to have increased O2 requirements and hypotensive, requiring albumin midodrine and tube feeds started.  Eliquis was also started.  Patient placed on diuretics.  Patient transferred out of the ICU as he no longer required BiPAP and transferred to [facility] service.  PCCM following for chest tube management.   Assessment & Plan:   Principal Problem:   Acute encephalopathy Active Problems:   PAF (paroxysmal atrial fibrillation) (HCC)   DM2 (diabetes mellitus, type 2) (HCC)   HTN (hypertension)   HLD (hyperlipidemia)   Hx of BKA, left (HCC)   Overdose by ingestion   Pleural effusion on right   Pressure injury of skin   Anemia   Protein-calorie malnutrition, severe   Acute respiratory failure with hypoxia (HCC)   Bilateral pleural effusion  Assessment and Plan: Acute Respiratory Failure with Hypoxia Bilateral Pleural Effusions Status Post Bilateral Chest Tube Placement Right  hydropneumothorax  Left Apical Pneumothorax -Patient status post bilateral chest tube placement per PCCM on 9/14 -chest tubes d/c'd 9/19 -CXR 9/22 with minimal l apical pneumothorax, perihilar vascular congestion with mild central edema, increased pleural effusions, R apical ptx not seen -Pulm recommending focus on diuresis -Continue 80 mg lasix IV daily, on spironolactone -net negative 22 L  -Pleural effusion noted to be transudative felt likely secondary to hepatic hydrothorax given patient's history of cirrhosis   2.  Cirrhosis/ascites/volume overload/pleural effusion/portal hypertension -Patient noted to have cirrhosis documented on CT imaging on 2015. -Right upper quadrant ultrasound done with cirrhotic morphology of the liver, no focal lesion identified, status postcholecystectomy, trace ascites, incidental note of right pleural effusion. -Continue spironolactone, Lasix, lactulose.  Stop ceftriaxone.  -Will need outpatient follow-up with GI.   3.  Acute metabolic encephalopathy/possible hypoactive ICU delirium/probable hepatic encephalopathy -Patient currently on delirium precautions. -Confusion is persistent, fluctuating -b12 wnl, folate wnl, repeat ammonia wnl -high dose thiamine empirically   -Continue lactulose 20 g daily per tube -follow MRI brain    4.  Paroxysmal atrial fibrillation - CHA2DS2VASC score 4 -Continue amiodarone 200 mg daily, metoprolol 12.5 mg daily for rate control. -Eliquis for anticoagulation. -Follow LFTs as patient on amiodarone per cardiology recommendations. -Cardiology was following but have signed off as of 06/25/2022. -Outpatient follow-up with cardiology.   5.  hypotension -BP borderline. -Continue midodrine 3 times daily -> titrate prn, will add albumin as well -Continue IV Lasix daily cautiously  -Monitor on beta-blocker as well (with holding parameters)   6.  GERD -PPI.     7.  Hyperlipidemia -Statin.   8.  Chronic anemia -Follow  H&H. -relatively stable -Transfusion threshold hemoglobin < 7.   9.  Malnutrition/debility/deconditioning -Continue current tube feeds. -PT/OT when patient more alert.   10.  Hypokalemia -Likely secondary to diuresis. -Follow.    11. Pressure injury, POA Pressure Injury 06/18/22 Buttocks Left;Medial Stage 2 -  Partial thickness loss of dermis presenting as Naiyana Barbian shallow open injury with Allure Greaser red, pink wound bed without slough. blister that burst (Active)  06/18/22 0700  Location: Buttocks  Location Orientation: Left;Medial  Staging: Stage 2 -  Partial thickness loss of dermis presenting as Ameliya Nicotra shallow open injury with Errin Chewning red, pink wound bed without slough.  Wound Description (Comments): blister that burst  Present on Admission: Yes      DVT prophylaxis: SCD, eliquis Code Status: full Family Communication: wife at bedside Disposition:   Status is: Inpatient Remains inpatient appropriate because: need for continued inpatuient care   Consultants:  Cisco RN 06/21/2022 PCCM: Timmothy Euler 06/22/2022 Cardiology: Dr. Angelena Form 06/22/2022 Palliative care Dr. Hilma Favors 06/23/2022  Procedures:  Thoracentesis 9/8 Bilateral chest tube placement 9/14, removal 9/19 Cortrack placement 9/12  Antimicrobials:  Anti-infectives (From admission, onward)    Start     Dose/Rate Route Frequency Ordered Stop   06/25/22 1200  cefTRIAXone (ROCEPHIN) 1 g in sodium chloride 0.9 % 100 mL IVPB  Status:  Discontinued        1 g 200 mL/hr over 30 Minutes Intravenous Every 24 hours 06/25/22 1131 06/29/22 1010   06/23/22 1400  ceFEPIme (MAXIPIME) 2 g in sodium chloride 0.9 % 100 mL IVPB  Status:  Discontinued        2 g 200 mL/hr over 30 Minutes Intravenous Every 8 hours 06/23/22 1045 06/25/22 1131   06/23/22 1000  ceFEPIme (MAXIPIME) 1 g in sodium chloride 0.9 % 100 mL IVPB  Status:  Discontinued        1 g 200 mL/hr over 30 Minutes Intravenous Every 12 hours 06/23/22 0529 06/23/22 1045   06/23/22 1000  linezolid  (ZYVOX) tablet 600 mg  Status:  Discontinued        600 mg Per Tube Every 12 hours 06/23/22 0529 06/24/22 1100   06/22/22 1145  cefTRIAXone (ROCEPHIN) 1 g in sodium chloride 0.9 % 100 mL IVPB  Status:  Discontinued        1 g 200 mL/hr over 30 Minutes Intravenous Every 24 hours 06/22/22 1055 06/23/22 0529   06/22/22 1145  azithromycin (ZITHROMAX) 500 mg in sodium chloride 0.9 % 250 mL IVPB  Status:  Discontinued        500 mg 250 mL/hr over 60 Minutes Intravenous Every 24 hours 06/22/22 1055 06/23/22 0529       Subjective: Remains confused, worse than yesterday Wife at bedside  Objective: Vitals:   07/01/22 0710 07/01/22 0712 07/01/22 0800 07/01/22 1109  BP: (!) 87/62 (!) 83/53 (!) 96/58 101/75  Pulse: (!) 108 92 92 (!) 121  Resp: (!) 26 (!) 26 (!) 25 (!) 22  Temp:  98.2 F (36.8 C)  98.3 F (36.8 C)  TempSrc:  Oral  Oral  SpO2: (!) 85% 95% 97% 100%  Weight:      Height:        Intake/Output Summary (Last 24 hours) at 07/01/2022 1638 Last data filed at 07/01/2022 1454 Gross per 24 hour  Intake --  Output 1250 ml  Net -1250 ml   Filed Weights   06/27/22 0347 06/28/22 0337 07/01/22 0621  Weight: 76.1 kg 77.7 kg 78.4 kg  Examination:  General: No acute distress. Cardiovascular: RRR Lungs: unlabored Abdomen: Soft, nontender, nondistended Neurological: lethargic, not consistently following commands, moving all extremities Extremities: LE edema    Data Reviewed: I have personally reviewed following labs and imaging studies  CBC: Recent Labs  Lab 06/27/22 0251 06/28/22 0419 06/29/22 0935 06/30/22 0443 07/01/22 0757  WBC 6.3 7.5 7.5 6.9 6.9  NEUTROABS  --   --   --  4.6 5.0  HGB 10.8* 10.9* 10.9* 10.5* 10.2*  HCT 33.3* 33.1* 34.1* 31.3* 30.5*  MCV 101.8* 102.2* 103.0* 100.0 99.3  PLT PLATELET CLUMPS NOTED ON SMEAR, UNABLE TO ESTIMATE PLATELET CLUMPS NOTED ON SMEAR, UNABLE TO ESTIMATE PLATELET CLUMPS NOTED ON SMEAR, UNABLE TO ESTIMATE PLATELET CLUMPS NOTED  ON SMEAR, UNABLE TO ESTIMATE 107*    Basic Metabolic Panel: Recent Labs  Lab 06/28/22 0419 06/29/22 0059 06/29/22 0935 06/30/22 0443 07/01/22 0326 07/01/22 0757  NA 145 141 139 138  --  135  K 4.0 4.3 3.7 3.7  --  4.2  CL 106 105 102 102  --  104  CO2 35* 32 31 28  --  26  GLUCOSE 140* 157* 165* 138*  --  149*  BUN 27* 27* 26* 25*  --  22  CREATININE 0.85 0.78 0.79 0.77  --  0.73  CALCIUM 9.1 8.8* 8.7* 9.0  --  8.8*  MG 2.2 2.4 2.2 2.2 2.3 2.2  PHOS  --   --   --  3.0  --  2.7    GFR: Estimated Creatinine Clearance: 81.1 mL/min (by C-G formula based on SCr of 0.73 mg/dL).  Liver Function Tests: Recent Labs  Lab 06/27/22 0251 06/28/22 0419 06/29/22 0935 06/30/22 0443 07/01/22 0757  AST 97* 164* 172* 149* 99*  ALT 54* 88* 112* 109* 83*  ALKPHOS 209* 234* 236* 240* 207*  BILITOT 0.9 0.6 0.6 0.8 0.9  PROT 5.3* 5.3* 5.4* 5.6* 5.8*  ALBUMIN 2.1* 2.0* 2.0* 2.3* 2.8*    CBG: Recent Labs  Lab 07/01/22 0017 07/01/22 0423 07/01/22 0807 07/01/22 1123 07/01/22 1624  GLUCAP 100* 117* 152* 143* 131*     Recent Results (from the past 240 hour(s))  MRSA Next Gen by PCR, Nasal     Status: None   Collection Time: 06/23/22 10:21 AM   Specimen: Nasal Mucosa; Nasal Swab  Result Value Ref Range Status   MRSA by PCR Next Gen NOT DETECTED NOT DETECTED Final    Comment: (NOTE) The GeneXpert MRSA Assay (FDA approved for NASAL specimens only), is one component of [facility] comprehensive MRSA colonization surveillance program. It is not intended to diagnose MRSA infection nor to guide or monitor treatment for MRSA infections. Test performance is not FDA approved in patients less than 37 years old. Performed at [facility], [place] [address] [place], [place] [address]          Radiology Studies: DG CHEST PORT 1 VIEW  Result Date: 06/30/2022 CLINICAL DATA:  [id number], pneumothorax or pleural effusions. EXAM: PORTABLE CHEST 1 VIEW COMPARISON:  Portable chest yesterday at 5:40  Darrell Horne.m. FINDINGS: 5:16 Darrell Horne.m. Minimally small left apical pneumothorax continues to be seen and is unchanged. Previously there was Darrell Horne small apical right pneumothorax with fluid having extending into the area. There are increased layering pleural effusions and overlying opacities in the lower lung fields consistent with atelectasis or consolidation. There is cardiomegaly with increased perihilar vascular congestion and mild central interstitial edema. The upper lung fields remain clear of focal infiltrates. In  all other respects no further changes. IMPRESSION: 1. Right apical pneumothorax is not seen today with pleural fluid having extended into the apical right chest with the pneumo component no longer visible. 2. Minimal left apical pneumothorax is unchanged. 3. Increased perihilar vascular congestion with mild central edema. 4. Increased pleural effusions and overlying atelectasis or consolidation. Electronically Signed   By: Telford Nab M.D.   On: 06/30/2022 06:23        Scheduled Meds:  amiodarone  200 mg Per Tube BID   amitriptyline  50 mg Per Tube QHS   apixaban  5 mg Per Tube BID   atorvastatin  40 mg Per Tube Daily   Chlorhexidine Gluconate Cloth  6 each Topical Daily   feeding supplement (OSMOLITE 1.5 CAL)  1,000 mL Per Tube Q24H   feeding supplement (PROSource TF20)  60 mL Per Tube Daily   folic acid  1 mg Per Tube Daily   free water  100 mL Per Tube Q4H   furosemide  80 mg Intravenous Daily   lactulose  20 g Per Tube Daily   metoprolol tartrate  12.5 mg Per Tube BID   midodrine  10 mg Per Tube TID WC   multivitamin with minerals  1 tablet Per Tube Daily   mupirocin cream   Topical Daily   polyethylene glycol  17 g Per Tube Daily   spironolactone  100 mg Per Tube Daily   [START ON 07/07/2022] thiamine  100 mg Oral Daily   topiramate  75 mg Per Tube Daily   zinc sulfate  220 mg Per Tube Daily   Continuous Infusions:  sodium chloride 250 mL (06/28/22 1247)   albumin human 25 g  (07/01/22 1059)   thiamine (VITAMIN B1) injection 500 mg (07/01/22 1249)   Followed by   Derrill Memo ON 07/02/2022] thiamine (VITAMIN B1) injection       LOS: 15 days    Time spent: over 30 min    Fayrene Helper, MD [facility]   To contact the attending provider between 7A-7P or the covering provider during after hours 7P-7A, please log into the web site [email or web address] and access using universal [place] password for that web site. If you do not have the password, please call the hospital operator.  07/01/2022, 4:38 PM

## 2022-07-01 NOTE — Progress Notes (Signed)
SLP Cancellation Note  Patient Details Name: Darrell Horne MRN: [id number] DOB: May 30, 1946   Cancelled treatment:       Reason Eval/Treat Not Completed: Fatigue/lethargy limiting ability to participate; pt has Cortrak in place to assist with nutrition/hydration purposes.  ST will continue efforts in acute setting.   Elvina Sidle, M.S., [place] 07/01/2022, 3:47 PM

## 2022-07-01 NOTE — Progress Notes (Signed)
Physical Therapy Treatment Patient Details Name: Darrell Horne MRN: [id number] DOB: 1946-01-25 Today's Date: 07/01/2022   History of Present Illness Patient is a 76 y/o male who presents on 9/7 from [facility] for AMS. Admitted with acute encephalopathy and respiratory failure. CXR-pleural effusion s/p thoracentesis 9/8. 9/14 increased O2 requirements with Bil chest tubes places. Recent admission 8/25-9/1 for acute toxic encephalopathy-drug toxicity/polypharmacy overdosing. PMH includes chronic A-fib on Eliquis, DM, HTN, peripheral neuropathy, left BKA.    PT Comments    Pt received supine and agreeable to PT/OT session. Pt more awake/alert this session, answering questions intermittently and following simple commands with increased time. Pt needing total assist +2 to come to sitting EOB and up to max assist to maintain static sitting as pt with strong posterior lean. Pt able to hold back of chair placed in front of pt to maintain sitting without assist after increased time and cues. Pt continues to be limited by decreased ROM/strength as well as cognition. Current plan remains appropriate to address deficits and maximize functional independence and decrease caregiver burden. Pt continues to benefit from skilled PT services to progress toward functional mobility goals.     Recommendations for follow up therapy are one component of a multi-disciplinary discharge planning process, led by the attending physician.  Recommendations may be updated based on patient status, additional functional criteria and insurance authorization.  Follow Up Recommendations  Skilled nursing-short term rehab (<3 hours/day) Can patient physically be transported by private vehicle: No   Assistance Recommended at Discharge Frequent or constant Supervision/Assistance  Patient can return home with the following A lot of help with bathing/dressing/bathroom;Assistance with cooking/housework;Help with stairs or ramp for  entrance;Two people to help with walking and/or transfers;Direct supervision/assist for medications management   Equipment Recommendations  None recommended by PT    Recommendations for Other Services       Precautions / Restrictions Precautions Precautions: Fall Precaution Comments: left BKA, no prosthesis in room (at [place]), bil chest tubes Restrictions Weight Bearing Restrictions: Yes LLE Weight Bearing: Non weight bearing     Mobility  Bed Mobility Overal bed mobility: Needs Assistance Bed Mobility: Rolling, Sidelying to Sit, Sit to Supine Rolling: Max assist, +2 for physical assistance Sidelying to sit: Mod assist, Max assist, +2 for physical assistance   Sit to supine: Max assist, +2 for physical assistance   General bed mobility comments: more alert and followed directions to assist with bed mobility but continues to require assist of 2    Transfers Overall transfer level: Needs assistance                 General transfer comment: unable    Ambulation/Gait                   Stairs             Wheelchair Mobility    Modified Rankin (Stroke Patients Only)       Balance Overall balance assessment: Needs assistance Sitting-balance support: Feet supported, Bilateral upper extremity supported Sitting balance-Leahy Scale: Poor Sitting balance - Comments: able to keep balance with UE support using back of chair, mod assist for sitting balance if not using UE support Postural control: Posterior lean                                  Cognition Arousal/Alertness: Awake/alert Behavior During Therapy: Flat affect Overall Cognitive Status: No family/caregiver present  to determine baseline cognitive functioning Area of Impairment: Orientation, Memory, Safety/judgement, Following commands, Problem solving, Awareness, Attention                 Orientation Level: Disoriented to, Place, Time, Situation Current Attention  Level: Focused Memory: Decreased recall of precautions, Decreased short-term memory Following Commands: Follows one step commands inconsistently, Follows one step commands with increased time Safety/Judgement: Decreased awareness of safety, Decreased awareness of deficits Awareness: Intellectual Problem Solving: Slow processing, Difficulty sequencing, Decreased initiation, Requires verbal cues, Requires tactile cues General Comments: patient more alert and following more commands        Exercises Other Exercises Other Exercises: long axis ER/IR RLE x20 Other Exercises: knee flexion/extension with over pressure through extension BLE    General Comments        Pertinent Vitals/Pain Pain Assessment Pain Assessment: Faces Faces Pain Scale: Hurts a little bit Pain Location: back sitting on EOB Pain Descriptors / Indicators: Discomfort, Grimacing Pain Intervention(s): Monitored during session, Limited activity within patient's tolerance    Home Living                          Prior Function            PT Goals (current goals can now be found in the care plan section) Acute Rehab PT Goals PT Goal Formulation: With patient/family Time For Goal Achievement: 07/04/22    Frequency    Min 2X/week      PT Plan Current plan remains appropriate    Co-evaluation PT/OT/SLP Co-Evaluation/Treatment: Yes Reason for Co-Treatment: Complexity of the patient's impairments (multi-system involvement);For patient/therapist safety;To address functional/ADL transfers PT goals addressed during session: Mobility/safety with mobility;Strengthening/ROM OT goals addressed during session: ADL's and self-care      AM-PAC PT "6 Clicks" Mobility   Outcome Measure  Help needed turning from your back to your side while in a flat bed without using bedrails?: A Lot Help needed moving from lying on your back to sitting on the side of a flat bed without using bedrails?: A Lot Help needed  moving to and from a bed to a chair (including a wheelchair)?: Total Help needed standing up from a chair using your arms (e.g., wheelchair or bedside chair)?: Total Help needed to walk in hospital room?: Total Help needed climbing 3-5 steps with a railing? : Total 6 Click Score: 8    End of Session   Activity Tolerance: Patient tolerated treatment well;Patient limited by lethargy Patient left: in bed;with call bell/phone within reach;with bed alarm set Nurse Communication: Mobility status PT Visit Diagnosis: Other abnormalities of gait and mobility (R26.89);Muscle weakness (generalized) (M62.81)     Time: 9753-0051 PT Time Calculation (min) (ACUTE ONLY): 27 min  Charges:  $Therapeutic Activity: 8-22 mins                     Vennela Jutte R. PTA Acute Rehabilitation Services Office: [place] 07/01/2022, 12:28 PM

## 2022-07-02 ENCOUNTER — Inpatient Hospital Stay (HOSPITAL_COMMUNITY): Payer: Medicare Other

## 2022-07-02 DIAGNOSIS — G9340 Encephalopathy, unspecified: Secondary | ICD-10-CM | POA: Diagnosis not present

## 2022-07-02 LAB — COMPREHENSIVE METABOLIC PANEL
ALT: 68 U/L — ABNORMAL HIGH (ref 0–44)
AST: 77 U/L — ABNORMAL HIGH (ref 15–41)
Albumin: 3.3 g/dL — ABNORMAL LOW (ref 3.5–5.0)
Alkaline Phosphatase: 196 U/L — ABNORMAL HIGH (ref 38–126)
Anion gap: 6 (ref 5–15)
BUN: 27 mg/dL — ABNORMAL HIGH (ref 8–23)
CO2: 22 mmol/L (ref 22–32)
Calcium: 9.1 mg/dL (ref 8.9–10.3)
Chloride: 105 mmol/L (ref 98–111)
Creatinine, Ser: 0.76 mg/dL (ref 0.61–1.24)
GFR, Estimated: >60 mL/min (ref >60)
Glucose, Bld: 145 mg/dL — ABNORMAL HIGH (ref 70–99)
Potassium: 4.1 mmol/L (ref 3.5–5.1)
Sodium: 133 mmol/L — ABNORMAL LOW (ref 135–145)
Total Bilirubin: 1.6 mg/dL — ABNORMAL HIGH (ref 0.3–1.2)
Total Protein: 6.1 g/dL — ABNORMAL LOW (ref 6.5–8.1)

## 2022-07-02 LAB — GLUCOSE, CAPILLARY
Glucose-Capillary: 154 mg/dL — ABNORMAL HIGH (ref 70–99)
Glucose-Capillary: 173 mg/dL — ABNORMAL HIGH (ref 70–99)
Glucose-Capillary: 151 mg/dL — ABNORMAL HIGH (ref 70–99)
Glucose-Capillary: 138 mg/dL — ABNORMAL HIGH (ref 70–99)
Glucose-Capillary: 155 mg/dL — ABNORMAL HIGH (ref 70–99)

## 2022-07-02 LAB — MAGNESIUM: Magnesium: 2.2 mg/dL (ref 1.7–2.4)

## 2022-07-02 LAB — BLOOD GAS, VENOUS
Acid-Base Excess: 2.7 mmol/L — ABNORMAL HIGH (ref 0.0–2.0)
Bicarbonate: 27.9 mmol/L (ref 20.0–28.0)
Drawn by: 6041
O2 Saturation: 50.4 %
Patient temperature: 37.2
pCO2, Ven: 44 mmHg (ref 44–60)
pH, Ven: 7.41 (ref 7.25–7.43)
pO2, Ven: 34 mmHg (ref 32–45)

## 2022-07-02 LAB — CBC WITH DIFFERENTIAL/PLATELET
Abs Immature Granulocytes: 0.11 10*3/uL — ABNORMAL HIGH (ref 0.00–0.07)
Basophils Absolute: 0 10*3/uL (ref 0.0–0.1)
Basophils Relative: 0 %
Eosinophils Absolute: 0.1 10*3/uL (ref 0.0–0.5)
Eosinophils Relative: 1 %
HCT: 31.9 % — ABNORMAL LOW (ref 39.0–52.0)
Hemoglobin: 10.6 g/dL — ABNORMAL LOW (ref 13.0–17.0)
Immature Granulocytes: 1 %
Lymphocytes Relative: 12 %
Lymphs Abs: 1.2 10*3/uL (ref 0.7–4.0)
MCH: 33.1 pg (ref 26.0–34.0)
MCHC: 33.2 g/dL (ref 30.0–36.0)
MCV: 99.7 fL (ref 80.0–100.0)
Monocytes Absolute: 1.1 10*3/uL — ABNORMAL HIGH (ref 0.1–1.0)
Monocytes Relative: 11 %
Neutro Abs: 7.4 10*3/uL (ref 1.7–7.7)
Neutrophils Relative %: 75 %
Platelets: 107 10*3/uL — ABNORMAL LOW (ref 150–400)
RBC: 3.2 MIL/uL — ABNORMAL LOW (ref 4.22–5.81)
RDW: 14.1 % (ref 11.5–15.5)
WBC: 10 10*3/uL (ref 4.0–10.5)
nRBC: 0 % (ref 0.0–0.2)

## 2022-07-02 LAB — BRAIN NATRIURETIC PEPTIDE: B Natriuretic Peptide: 375.1 pg/mL — ABNORMAL HIGH (ref 0.0–100.0)

## 2022-07-02 LAB — PHOSPHORUS: Phosphorus: 2.6 mg/dL (ref 2.5–4.6)

## 2022-07-02 MED ORDER — METOPROLOL TARTRATE 5 MG/5ML IV SOLN
2.5000 mg | INTRAVENOUS | Status: AC | PRN
  Administered 2022-07-03 (×3): 2.5 mg via INTRAVENOUS
  Filled 2022-07-02 (×2): qty 5

## 2022-07-02 MED ORDER — FUROSEMIDE 10 MG/ML IJ SOLN
80.0000 mg | Freq: Two times a day (BID) | INTRAMUSCULAR | Status: DC
  Administered 2022-07-03 (×2): 80 mg via INTRAVENOUS
  Filled 2022-07-02 (×2): qty 8

## 2022-07-02 MED ORDER — FUROSEMIDE 10 MG/ML IJ SOLN
80.0000 mg | Freq: Once | INTRAMUSCULAR | Status: AC
  Administered 2022-07-02: 80 mg via INTRAVENOUS
  Filled 2022-07-02: qty 8

## 2022-07-02 NOTE — Progress Notes (Signed)
PROGRESS NOTE    [name]  RJJ:[id number] DOB: [birth year] DOA: 06/16/2022 PCP: Sandrea Hughs, NP  Chief Complaint  Patient presents with   Altered Mental Status    Brief Narrative:  Darrell Horne is Darrell Horne 76 year old male with [place]ha Doster past medical history of paroxysmal Nocole Zammit-fib on Eliquis, left BKA, HTN, HLD, and alcoholic cirrhosis who presented to the hospital with altered mental status and was admitted on 06/16/2022.  He was found to have Cerena Baine large right-sided pleural effusion and his intermittently increased since August 26.  Thoracentesis on 06/17/2022 81 with 2 L of yellow fluid was found to be transudative pleural effusion.  Initially he did well after the procedure but over the past couple days has had increased oxygen requirements and episodes of hypotension.  Hypotension was treated with albumin and midodrine.  PCCM was consulted for further evaluation and management of his right-sided pleural effusion and acute hypoxic respiratory failure. Patient underwent thoracentesis with transudative fluid noted, noted to have increased O2 requirements and hypotensive, requiring albumin midodrine and tube feeds started.  Eliquis was also started.  Patient placed on diuretics.  Patient transferred out of the ICU as he no longer required BiPAP and transferred to [facility] service.  PCCM following for chest tube management.   Assessment & Plan:   Principal Problem:   Acute encephalopathy Active Problems:   PAF (paroxysmal atrial fibrillation) (HCC)   DM2 (diabetes mellitus, type 2) (HCC)   HTN (hypertension)   HLD (hyperlipidemia)   Hx of BKA, left (HCC)   Overdose by ingestion   Pleural effusion on right   Pressure injury of skin   Anemia   Protein-calorie malnutrition, severe   Acute respiratory failure with hypoxia (HCC)   Bilateral pleural effusion  Assessment and Plan: Acute Respiratory Failure with Hypoxia Bilateral Pleural Effusions Status Post Bilateral Chest Tube Placement Right  hydropneumothorax  Left Apical Pneumothorax -Patient status post bilateral chest tube placement per PCCM on 9/14 -chest tubes d/c'd 9/19 -CXR 9/22 with minimal l apical pneumothorax, perihilar vascular congestion with mild central edema, increased pleural effusions, R apical ptx not seen -CXR 9/23 with upper lobe predominant opacities bilaterally -> Infection vs atypical pulm edema? -Pulm recommending focus on diuresis -increase lasix to 80 mg BID, continue spironolactone -will hold off on restarting abx as afebrile and normal white count  -net negative 21.4 L  -Pleural effusion noted to be transudative felt likely secondary to hepatic hydrothorax given patient's history of cirrhosis   2.  Cirrhosis/ascites/volume overload/pleural effusion/portal hypertension -Patient noted to have cirrhosis documented on CT imaging on 2015. -Right upper quadrant ultrasound done with cirrhotic morphology of the liver, no focal lesion identified, status postcholecystectomy, trace ascites, incidental note of right pleural effusion. -Continue spironolactone, Lasix, lactulose.  Stop ceftriaxone.  -Will need outpatient follow-up with GI.   3.  Acute metabolic encephalopathy/possible hypoactive ICU delirium/probable hepatic encephalopathy -Patient currently on delirium precautions. -Confusion is persistent, fluctuating -b12 wnl, folate wnl, repeat ammonia wnl -high dose thiamine empirically   -Continue lactulose 20 g daily per tube -follow MRI brain -> pending   4.  Paroxysmal atrial fibrillation with RVR -rates c/w RVR today, will continue amiodarone and metop PO as tolerated, add IV metop prn with holding parameters  -CHA2DS2VASC score 4 -Continue amiodarone 200 mg daily, metoprolol 12.5 mg daily for rate control. -Eliquis for anticoagulation. -Follow LFTs as patient on amiodarone per cardiology recommendations. -Cardiology was following but have signed off as of 06/25/2022. -Outpatient follow-up with  cardiology.  5.  hypotension -BP borderline. -Continue midodrine 3 times daily -> titrate prn, will add albumin as well -Continue IV Lasix daily cautiously  -Monitor on beta-blocker as well (with holding parameters)   6.  GERD -PPI.     7.  Hyperlipidemia -Statin.   8.  Chronic anemia -Follow H&H. -relatively stable -Transfusion threshold hemoglobin < 7.   9.  Malnutrition/debility/deconditioning -Continue current tube feeds. -PT/OT when patient more alert.   10.  Hypokalemia -Likely secondary to diuresis. -Follow.    11. Pressure injury, POA Pressure Injury 06/18/22 Buttocks Left;Medial Stage 2 -  Partial thickness loss of dermis presenting as Tuesday Terlecki shallow open injury with Zealand Boyett red, pink wound bed without slough. blister that burst (Active)  06/18/22 0700  Location: Buttocks  Location Orientation: Left;Medial  Staging: Stage 2 -  Partial thickness loss of dermis presenting as Lejuan Botto shallow open injury with Sandrika Schwinn red, pink wound bed without slough.  Wound Description (Comments): blister that burst  Present on Admission: Yes      DVT prophylaxis: SCD, eliquis Code Status: full Family Communication: none at bedside Disposition:   Status is: Inpatient Remains inpatient appropriate because: need for continued inpatuient care   Consultants:  Foley RN 06/21/2022 PCCM: Timmothy Euler 06/22/2022 Cardiology: Dr. Angelena Form 06/22/2022 Palliative care Dr. Hilma Favors 06/23/2022  Procedures:  Thoracentesis 9/8 Bilateral chest tube placement 9/14, removal 9/19 Cortrack placement 9/12  Antimicrobials:  Anti-infectives (From admission, onward)    Start     Dose/Rate Route Frequency Ordered Stop   06/25/22 1200  cefTRIAXone (ROCEPHIN) 1 g in sodium chloride 0.9 % 100 mL IVPB  Status:  Discontinued        1 g 200 mL/hr over 30 Minutes Intravenous Every 24 hours 06/25/22 1131 06/29/22 1010   06/23/22 1400  ceFEPIme (MAXIPIME) 2 g in sodium chloride 0.9 % 100 mL IVPB  Status:  Discontinued        2  g 200 mL/hr over 30 Minutes Intravenous Every 8 hours 06/23/22 1045 06/25/22 1131   06/23/22 1000  ceFEPIme (MAXIPIME) 1 g in sodium chloride 0.9 % 100 mL IVPB  Status:  Discontinued        1 g 200 mL/hr over 30 Minutes Intravenous Every 12 hours 06/23/22 0529 06/23/22 1045   06/23/22 1000  linezolid (ZYVOX) tablet 600 mg  Status:  Discontinued        600 mg Per Tube Every 12 hours 06/23/22 0529 06/24/22 1100   06/22/22 1145  cefTRIAXone (ROCEPHIN) 1 g in sodium chloride 0.9 % 100 mL IVPB  Status:  Discontinued        1 g 200 mL/hr over 30 Minutes Intravenous Every 24 hours 06/22/22 1055 06/23/22 0529   06/22/22 1145  azithromycin (ZITHROMAX) 500 mg in sodium chloride 0.9 % 250 mL IVPB  Status:  Discontinued        500 mg 250 mL/hr over 60 Minutes Intravenous Every 24 hours 06/22/22 1055 06/23/22 0529       Subjective: Lethargic, doesn't say much today  Objective: Vitals:   07/02/22 0351 07/02/22 0400 07/02/22 0732 07/02/22 1137  BP: (!) 101/59 103/67 (!) 108/59 102/73  Pulse: 100 61 (!) 126 (!) 108  Resp: 16 (!) 21 13 (!) 26  Temp: 99 F (37.2 C)  98.3 F (36.8 C) 98.3 F (36.8 C)  TempSrc: Axillary  Axillary Oral  SpO2: 90% 91% 93% 90%  Weight: 77.7 kg     Height:        Intake/Output Summary (Last  24 hours) at 07/02/2022 1736 Last data filed at 07/02/2022 1140 Gross per 24 hour  Intake 1977.42 ml  Output 1245 ml  Net 732.42 ml   Filed Weights   06/28/22 0337 07/01/22 0621 07/02/22 0351  Weight: 77.7 kg 78.4 kg 77.7 kg    Examination:  General: No acute distress. Cardiovascular: irergularly irregular, tachy Lungs: unlabored Abdomen: Soft, nontender, nondistended Neurological: lethargic, intermittently awakening to follow instructions, PERRL  Extremities: No clubbing or cyanosis.    Data Reviewed: I have personally reviewed following labs and imaging studies  CBC: Recent Labs  Lab 06/28/22 0419 06/29/22 0935 06/30/22 0443 07/01/22 0757 07/02/22 0244   WBC 7.5 7.5 6.9 6.9 10.0  NEUTROABS  --   --  4.6 5.0 7.4  HGB 10.9* 10.9* 10.5* 10.2* 10.6*  HCT 33.1* 34.1* 31.3* 30.5* 31.9*  MCV 102.2* 103.0* 100.0 99.3 99.7  PLT PLATELET CLUMPS NOTED ON SMEAR, UNABLE TO ESTIMATE PLATELET CLUMPS NOTED ON SMEAR, UNABLE TO ESTIMATE PLATELET CLUMPS NOTED ON SMEAR, UNABLE TO ESTIMATE 107* 107*    Basic Metabolic Panel: Recent Labs  Lab 06/29/22 0059 06/29/22 0935 06/30/22 0443 07/01/22 0326 07/01/22 0757 07/02/22 0244  NA 141 139 138  --  135 133*  K 4.3 3.7 3.7  --  4.2 4.1  CL 105 102 102  --  104 105  CO2 32 31 28  --  26 22  GLUCOSE 157* 165* 138*  --  149* 145*  BUN 27* 26* 25*  --  22 27*  CREATININE 0.78 0.79 0.77  --  0.73 0.76  CALCIUM 8.8* 8.7* 9.0  --  8.8* 9.1  MG 2.4 2.2 2.2 2.3 2.2 2.2  PHOS  --   --  3.0  --  2.7 2.6    GFR: Estimated Creatinine Clearance: 81.1 mL/min (by C-G formula based on SCr of 0.76 mg/dL).  Liver Function Tests: Recent Labs  Lab 06/28/22 0419 06/29/22 0935 06/30/22 0443 07/01/22 0757 07/02/22 0244  AST 164* 172* 149* 99* 77*  ALT 88* 112* 109* 83* 68*  ALKPHOS 234* 236* 240* 207* 196*  BILITOT 0.6 0.6 0.8 0.9 1.6*  PROT 5.3* 5.4* 5.6* 5.8* 6.1*  ALBUMIN 2.0* 2.0* 2.3* 2.8* 3.3*    CBG: Recent Labs  Lab 07/01/22 2316 07/02/22 0354 07/02/22 0757 07/02/22 1136 07/02/22 1645  GLUCAP 132* 154* 173* 151* 138*     Recent Results (from the past 240 hour(s))  MRSA Next Gen by PCR, Nasal     Status: None   Collection Time: 06/23/22 10:21 AM   Specimen: Nasal Mucosa; Nasal Swab  Result Value Ref Range Status   MRSA by PCR Next Gen NOT DETECTED NOT DETECTED Final    Comment: (NOTE) The GeneXpert MRSA Assay (FDA approved for NASAL specimens only), is one component of Takasha Vetere comprehensive MRSA colonization surveillance program. It is not intended to diagnose MRSA infection nor to guide or monitor treatment for MRSA infections. Test performance is not FDA approved in patients less than 58  years old. Performed at [facility], [place] [address], [place], [place] [address]          Radiology Studies: DG CHEST PORT 1 VIEW  Result Date: 07/02/2022 CLINICAL DATA:  10026 altered mental status EXAM: PORTABLE CHEST 1 VIEW COMPARISON:  September 20-22, 2023 FINDINGS: Evaluation is limited by patient rotation. Cardiomediastinal silhouette is grossly unchanged. The enteric tube courses through the chest to the abdomen beyond the field-of-view. There is diffuse hazy airspace opacities bilaterally with  an upper lung predominance, overall increased since September 20th. There is increased confluent opacity of bilateral apices. Shelaine Frie definitive pneumothorax is not identified. Likely small bilateral pleural effusions. IMPRESSION: Increasing upper lobe predominant opacities bilaterally with increased apical confluence. Differential considerations include infection and atypical pulmonary edema. Electronically Signed   By: Valentino Saxon M.D.   On: 07/02/2022 12:07   DG CHEST PORT 1 VIEW  Result Date: 07/01/2022 CLINICAL DATA:  Follow-up abnormal chest x-ray. EXAM: PORTABLE CHEST 1 VIEW COMPARISON:  June 30, 2022 FINDINGS: There is stable nasogastric tube positioning. The heart size and mediastinal contours are within normal limits. Mild infiltrates are seen within the mid to upper right lung and left lung base. This is mildly increased in severity when compared to the prior study. Small bilateral pleural effusions are noted. Feleshia Zundel tiny, stable left apical pneumothorax is seen. The visualized skeletal structures are unremarkable. IMPRESSION: 1. Mild mid to upper right lung and left basilar infiltrates, mildly increased in severity when compared to the prior study. 2. Stable tiny left apical pneumothorax. 3. Small bilateral pleural effusions. Electronically Signed   By: Virgina Norfolk M.D.   On: 07/01/2022 17:26        Scheduled Meds:  amiodarone  200 mg Per Tube BID   amitriptyline  50  mg Per Tube QHS   apixaban  5 mg Per Tube BID   atorvastatin  40 mg Per Tube Daily   Chlorhexidine Gluconate Cloth  6 each Topical Daily   feeding supplement (OSMOLITE 1.5 CAL)  1,000 mL Per Tube Q24H   feeding supplement (PROSource TF20)  60 mL Per Tube Daily   folic acid  1 mg Per Tube Daily   free water  100 mL Per Tube Q4H   furosemide  80 mg Intravenous Daily   lactulose  20 g Per Tube Daily   metoprolol tartrate  12.5 mg Per Tube BID   midodrine  10 mg Per Tube TID WC   multivitamin with minerals  1 tablet Per Tube Daily   mupirocin cream   Topical Daily   polyethylene glycol  17 g Per Tube Daily   spironolactone  100 mg Per Tube Daily   [START ON 07/07/2022] thiamine  100 mg Oral Daily   topiramate  75 mg Per Tube Daily   zinc sulfate  220 mg Per Tube Daily   Continuous Infusions:  sodium chloride 250 mL (06/28/22 1247)   albumin human 60 mL/hr at 07/01/22 2310   thiamine (VITAMIN B1) injection 250 mg (07/02/22 1731)     LOS: 16 days    Time spent: over 30 min    Fayrene Helper, MD [facility]   To contact the attending provider between 7A-7P or the covering provider during after hours 7P-7A, please log into the web site [email or web address] and access using universal [place] password for that web site. If you do not have the password, please call the hospital operator.  07/02/2022, 5:36 PM

## 2022-07-02 NOTE — Progress Notes (Signed)
Pharmacy Antibiotic Note  Darrell Horne is a 76 y.o. male admitted on 06/16/2022 with AMS.  Patient was previously on broad spectrum antibiotics for PNA/SBP.  Also had pleural effusion s/p thoracentesis.  Lasix dose increased earlier today.  Now with increased oxygen requirement and Pharmacy has been consulted for vancomycin and cefepime dosing for sepsis.  Renal function stable, afebrile, WBC WNL.  Plan: Vanc '1500mg'$  IV x 1, then 1gm IV Q12H for AUC 499 using SCr 0.8 Cefepime 2gm IV Q8H Monitor renal fxn, clinical progress to de-escalate vs check vanc AUC  Height: '5\' 10"'$  (177.8 cm) Weight: 77.7 kg (171 lb 4.8 oz) IBW/kg (Calculated) : 73  Temp (24hrs), Avg:98.7 F (37.1 C), Min:97.7 F (36.5 C), Max:100.8 F (38.2 C)  Recent Labs  Lab 06/28/22 0419 06/29/22 0059 06/29/22 0935 06/30/22 0443 07/01/22 0757 07/02/22 0244  WBC 7.5  --  7.5 6.9 6.9 10.0  CREATININE 0.85 0.78 0.79 0.77 0.73 0.76    Estimated Creatinine Clearance: 81.1 mL/min (by C-G formula based on SCr of 0.76 mg/dL).    No Known Allergies  CTX 9/16 > 9/20 Cefep 9/14 > 9/16, 9/23 >> Zyvox 9/14 > 9/15 Vanc 9/23 >>  9/7 COVID: neg 9/8 pleural fluid: neg 9/14 MRSA PCR: neg 9/23 BCx -   Vipul Cafarelli D. Mina Marble, PharmD, BCPS, [place] 07/03/2022, 12:01 AM

## 2022-07-03 ENCOUNTER — Inpatient Hospital Stay (HOSPITAL_COMMUNITY): Payer: Medicare Other

## 2022-07-03 ENCOUNTER — Inpatient Hospital Stay: Payer: Self-pay

## 2022-07-03 DIAGNOSIS — G9340 Encephalopathy, unspecified: Secondary | ICD-10-CM | POA: Diagnosis not present

## 2022-07-03 DIAGNOSIS — J9601 Acute respiratory failure with hypoxia: Secondary | ICD-10-CM | POA: Diagnosis not present

## 2022-07-03 LAB — GLUCOSE, CAPILLARY
Glucose-Capillary: 151 mg/dL — ABNORMAL HIGH (ref 70–99)
Glucose-Capillary: 155 mg/dL — ABNORMAL HIGH (ref 70–99)
Glucose-Capillary: 161 mg/dL — ABNORMAL HIGH (ref 70–99)
Glucose-Capillary: 161 mg/dL — ABNORMAL HIGH (ref 70–99)
Glucose-Capillary: 172 mg/dL — ABNORMAL HIGH (ref 70–99)
Glucose-Capillary: 177 mg/dL — ABNORMAL HIGH (ref 70–99)
Glucose-Capillary: 182 mg/dL — ABNORMAL HIGH (ref 70–99)
Glucose-Capillary: 183 mg/dL — ABNORMAL HIGH (ref 70–99)

## 2022-07-03 LAB — COMPREHENSIVE METABOLIC PANEL
ALT: 78 U/L — ABNORMAL HIGH (ref 0–44)
ALT: 79 U/L — ABNORMAL HIGH (ref 0–44)
AST: 85 U/L — ABNORMAL HIGH (ref 15–41)
AST: 97 U/L — ABNORMAL HIGH (ref 15–41)
Albumin: 3.1 g/dL — ABNORMAL LOW (ref 3.5–5.0)
Albumin: 2.7 g/dL — ABNORMAL LOW (ref 3.5–5.0)
Alkaline Phosphatase: 200 U/L — ABNORMAL HIGH (ref 38–126)
Alkaline Phosphatase: 213 U/L — ABNORMAL HIGH (ref 38–126)
Anion gap: 9 (ref 5–15)
Anion gap: 10 (ref 5–15)
BUN: 40 mg/dL — ABNORMAL HIGH (ref 8–23)
BUN: 53 mg/dL — ABNORMAL HIGH (ref 8–23)
CO2: 23 mmol/L (ref 22–32)
CO2: 20 mmol/L — ABNORMAL LOW (ref 22–32)
Calcium: 9.2 mg/dL (ref 8.9–10.3)
Calcium: 9 mg/dL (ref 8.9–10.3)
Chloride: 105 mmol/L (ref 98–111)
Chloride: 106 mmol/L (ref 98–111)
Creatinine, Ser: 0.89 mg/dL (ref 0.61–1.24)
Creatinine, Ser: 1.09 mg/dL (ref 0.61–1.24)
GFR, Estimated: >60 mL/min (ref >60)
GFR, Estimated: >60 mL/min (ref >60)
Glucose, Bld: 165 mg/dL — ABNORMAL HIGH (ref 70–99)
Glucose, Bld: 205 mg/dL — ABNORMAL HIGH (ref 70–99)
Potassium: 4.3 mmol/L (ref 3.5–5.1)
Potassium: 4.3 mmol/L (ref 3.5–5.1)
Sodium: 137 mmol/L (ref 135–145)
Sodium: 136 mmol/L (ref 135–145)
Total Bilirubin: 2 mg/dL — ABNORMAL HIGH (ref 0.3–1.2)
Total Bilirubin: 1.9 mg/dL — ABNORMAL HIGH (ref 0.3–1.2)
Total Protein: 6.3 g/dL — ABNORMAL LOW (ref 6.5–8.1)
Total Protein: 7.1 g/dL (ref 6.5–8.1)

## 2022-07-03 LAB — CBC WITH DIFFERENTIAL/PLATELET
Abs Immature Granulocytes: 0.13 10*3/uL — ABNORMAL HIGH (ref 0.00–0.07)
Basophils Absolute: 0 10*3/uL (ref 0.0–0.1)
Basophils Relative: 0 %
Eosinophils Absolute: 0 10*3/uL (ref 0.0–0.5)
Eosinophils Relative: 0 %
HCT: 29.3 % — ABNORMAL LOW (ref 39.0–52.0)
Hemoglobin: 10 g/dL — ABNORMAL LOW (ref 13.0–17.0)
Immature Granulocytes: 1 %
Lymphocytes Relative: 7 %
Lymphs Abs: 1.1 10*3/uL (ref 0.7–4.0)
MCH: 33.8 pg (ref 26.0–34.0)
MCHC: 34.1 g/dL (ref 30.0–36.0)
MCV: 99 fL (ref 80.0–100.0)
Monocytes Absolute: 1.6 10*3/uL — ABNORMAL HIGH (ref 0.1–1.0)
Monocytes Relative: 11 %
Neutro Abs: 12 10*3/uL — ABNORMAL HIGH (ref 1.7–7.7)
Neutrophils Relative %: 81 %
Platelets: 165 10*3/uL (ref 150–400)
RBC: 2.96 MIL/uL — ABNORMAL LOW (ref 4.22–5.81)
RDW: 14.5 % (ref 11.5–15.5)
WBC: 14.9 10*3/uL — ABNORMAL HIGH (ref 4.0–10.5)
nRBC: 0 % (ref 0.0–0.2)

## 2022-07-03 LAB — POCT I-STAT 7, (LYTES, BLD GAS, ICA,H+H)
Acid-base deficit: 1 mmol/L (ref 0.0–2.0)
Acid-base deficit: 1 mmol/L (ref 0.0–2.0)
Bicarbonate: 22.2 mmol/L (ref 20.0–28.0)
Bicarbonate: 22.7 mmol/L (ref 20.0–28.0)
Calcium, Ion: 1.29 mmol/L (ref 1.15–1.40)
Calcium, Ion: 1.3 mmol/L (ref 1.15–1.40)
HCT: 25 % — ABNORMAL LOW (ref 39.0–52.0)
HCT: 50 % (ref 39.0–52.0)
Hemoglobin: 17 g/dL (ref 13.0–17.0)
Hemoglobin: 8.5 g/dL — ABNORMAL LOW (ref 13.0–17.0)
O2 Saturation: 97 %
O2 Saturation: 97 %
Patient temperature: 13.03
Patient temperature: 37.6
Potassium: 3.9 mmol/L (ref 3.5–5.1)
Potassium: 3.9 mmol/L (ref 3.5–5.1)
Sodium: 139 mmol/L (ref 135–145)
Sodium: 139 mmol/L (ref 135–145)
TCO2: 23 mmol/L (ref 22–32)
TCO2: 24 mmol/L (ref 22–32)
pCO2 arterial: 34.8 mmHg (ref 32–48)
pCO2 arterial: <15 mmHg — CL (ref 32–48)
pH, Arterial: 7.424 (ref 7.35–7.45)
pH, Arterial: 7.815 (ref 7.35–7.45)
pO2, Arterial: 17 mmHg — CL (ref 83–108)
pO2, Arterial: 92 mmHg (ref 83–108)

## 2022-07-03 LAB — CBC
HCT: 29 % — ABNORMAL LOW (ref 39.0–52.0)
Hemoglobin: 9.6 g/dL — ABNORMAL LOW (ref 13.0–17.0)
MCH: 33.4 pg (ref 26.0–34.0)
MCHC: 33.1 g/dL (ref 30.0–36.0)
MCV: 101 fL — ABNORMAL HIGH (ref 80.0–100.0)
Platelets: 89 10*3/uL — ABNORMAL LOW (ref 150–400)
RBC: 2.87 MIL/uL — ABNORMAL LOW (ref 4.22–5.81)
RDW: 14.7 % (ref 11.5–15.5)
WBC: 16.2 10*3/uL — ABNORMAL HIGH (ref 4.0–10.5)
nRBC: 0 % (ref 0.0–0.2)

## 2022-07-03 LAB — MRSA NEXT GEN BY PCR, NASAL: MRSA by PCR Next Gen: NOT DETECTED

## 2022-07-03 LAB — LACTIC ACID, PLASMA: Lactic Acid, Venous: 1.7 mmol/L (ref 0.5–1.9)

## 2022-07-03 LAB — BLOOD GAS, ARTERIAL
Acid-Base Excess: 0.8 mmol/L (ref 0.0–2.0)
Bicarbonate: 24.2 mmol/L (ref 20.0–28.0)
Drawn by: 7159
O2 Saturation: 99.1 %
Patient temperature: 38.1
pCO2 arterial: 36 mmHg (ref 32–48)
pH, Arterial: 7.44 (ref 7.35–7.45)
pO2, Arterial: 166 mmHg — ABNORMAL HIGH (ref 83–108)

## 2022-07-03 LAB — PHOSPHORUS
Phosphorus: 3.1 mg/dL (ref 2.5–4.6)
Phosphorus: 3.6 mg/dL (ref 2.5–4.6)

## 2022-07-03 LAB — SARS CORONAVIRUS 2 BY RT PCR: SARS Coronavirus 2 by RT PCR: NEGATIVE

## 2022-07-03 LAB — PROTIME-INR
INR: 2.1 — ABNORMAL HIGH (ref 0.8–1.2)
Prothrombin Time: 23.3 seconds — ABNORMAL HIGH (ref 11.4–15.2)

## 2022-07-03 LAB — MAGNESIUM
Magnesium: 2.3 mg/dL (ref 1.7–2.4)
Magnesium: 2.4 mg/dL (ref 1.7–2.4)

## 2022-07-03 LAB — AMMONIA: Ammonia: 43 umol/L — ABNORMAL HIGH (ref 9–35)

## 2022-07-03 LAB — BRAIN NATRIURETIC PEPTIDE: B Natriuretic Peptide: 812.3 pg/mL — ABNORMAL HIGH (ref 0.0–100.0)

## 2022-07-03 MED ORDER — FUROSEMIDE 10 MG/ML IJ SOLN
100.0000 mg | Freq: Three times a day (TID) | INTRAVENOUS | Status: DC
  Filled 2022-07-03 (×2): qty 10

## 2022-07-03 MED ORDER — AMIODARONE LOAD VIA INFUSION
150.0000 mg | Freq: Once | INTRAVENOUS | Status: DC
  Administered 2022-07-03: 150 mg via INTRAVENOUS
  Filled 2022-07-03: qty 83.34

## 2022-07-03 MED ORDER — DEXMEDETOMIDINE HCL IN NACL 400 MCG/100ML IV SOLN
0.4000 ug/kg/h | INTRAVENOUS | Status: DC
  Administered 2022-07-03: 0.4 ug/kg/h via INTRAVENOUS
  Administered 2022-07-04 (×2): 1 ug/kg/h via INTRAVENOUS
  Administered 2022-07-04: 0.6 ug/kg/h via INTRAVENOUS
  Administered 2022-07-05: 1 ug/kg/h via INTRAVENOUS
  Administered 2022-07-05: 0.6 ug/kg/h via INTRAVENOUS
  Administered 2022-07-05: 1 ug/kg/h via INTRAVENOUS
  Administered 2022-07-06 – 2022-07-07 (×7): 1.2 ug/kg/h via INTRAVENOUS
  Administered 2022-07-07: 0.5 ug/kg/h via INTRAVENOUS
  Administered 2022-07-07 (×2): 1.2 ug/kg/h via INTRAVENOUS
  Administered 2022-07-08: 0.6 ug/kg/h via INTRAVENOUS
  Administered 2022-07-08: 0.7 ug/kg/h via INTRAVENOUS
  Administered 2022-07-08 (×2): 1.2 ug/kg/h via INTRAVENOUS
  Administered 2022-07-09: 0.8 ug/kg/h via INTRAVENOUS
  Administered 2022-07-09: 0.6 ug/kg/h via INTRAVENOUS
  Administered 2022-07-09 (×3): 1.2 ug/kg/h via INTRAVENOUS
  Administered 2022-07-10: 0.9 ug/kg/h via INTRAVENOUS
  Administered 2022-07-10: 1 ug/kg/h via INTRAVENOUS
  Administered 2022-07-11: 0.8 ug/kg/h via INTRAVENOUS
  Administered 2022-07-11: 0.5 ug/kg/h via INTRAVENOUS
  Filled 2022-07-03 (×32): qty 100

## 2022-07-03 MED ORDER — SODIUM CHLORIDE 0.9 % IV SOLN
2.0000 g | Freq: Once | INTRAVENOUS | Status: AC
  Administered 2022-07-03: 2 g via INTRAVENOUS
  Filled 2022-07-03: qty 12.5

## 2022-07-03 MED ORDER — VANCOMYCIN HCL 1500 MG/300ML IV SOLN
1500.0000 mg | Freq: Once | INTRAVENOUS | Status: AC
  Administered 2022-07-03: 1500 mg via INTRAVENOUS
  Filled 2022-07-03 (×2): qty 300

## 2022-07-03 MED ORDER — INSULIN ASPART 100 UNIT/ML IJ SOLN
0.0000 [IU] | INTRAMUSCULAR | Status: DC
  Administered 2022-07-03 – 2022-07-06 (×14): 2 [IU] via SUBCUTANEOUS
  Administered 2022-07-07 (×2): 1 [IU] via SUBCUTANEOUS
  Administered 2022-07-07: 2 [IU] via SUBCUTANEOUS
  Administered 2022-07-07: 1 [IU] via SUBCUTANEOUS
  Administered 2022-07-07: 2 [IU] via SUBCUTANEOUS
  Administered 2022-07-08: 1 [IU] via SUBCUTANEOUS
  Administered 2022-07-08 (×2): 2 [IU] via SUBCUTANEOUS
  Administered 2022-07-08: 3 [IU] via SUBCUTANEOUS
  Administered 2022-07-08 (×2): 1 [IU] via SUBCUTANEOUS
  Administered 2022-07-09 (×2): 2 [IU] via SUBCUTANEOUS
  Administered 2022-07-09 (×2): 1 [IU] via SUBCUTANEOUS
  Administered 2022-07-09: 2 [IU] via SUBCUTANEOUS
  Administered 2022-07-10: 1 [IU] via SUBCUTANEOUS
  Administered 2022-07-10: 2 [IU] via SUBCUTANEOUS
  Administered 2022-07-10 – 2022-07-11 (×7): 1 [IU] via SUBCUTANEOUS

## 2022-07-03 MED ORDER — FUROSEMIDE 10 MG/ML IJ SOLN
5.0000 mg/h | INTRAVENOUS | Status: DC
  Administered 2022-07-03: 4 mg/h via INTRAVENOUS
  Administered 2022-07-04: 8 mg/h via INTRAVENOUS
  Administered 2022-07-05: 10 mg/h via INTRAVENOUS
  Administered 2022-07-06: 5 mg/h via INTRAVENOUS
  Filled 2022-07-03 (×4): qty 20

## 2022-07-03 MED ORDER — SODIUM CHLORIDE 0.9 % IV SOLN
2.0000 g | Freq: Three times a day (TID) | INTRAVENOUS | Status: DC
  Administered 2022-07-03 – 2022-07-04 (×3): 2 g via INTRAVENOUS
  Filled 2022-07-03 (×3): qty 12.5

## 2022-07-03 MED ORDER — METOPROLOL TARTRATE 5 MG/5ML IV SOLN: 2.5000 mg | INTRAVENOUS | Status: DC | PRN

## 2022-07-03 MED ORDER — AMIODARONE HCL IN DEXTROSE 360-4.14 MG/200ML-% IV SOLN
60.0000 mg/h | INTRAVENOUS | Status: AC
  Administered 2022-07-03 (×2): 60 mg/h via INTRAVENOUS
  Filled 2022-07-03 (×2): qty 200

## 2022-07-03 MED ORDER — VANCOMYCIN HCL IN DEXTROSE 1-5 GM/200ML-% IV SOLN
1000.0000 mg | Freq: Two times a day (BID) | INTRAVENOUS | Status: DC
  Filled 2022-07-03: qty 200

## 2022-07-03 MED ORDER — AMIODARONE HCL IN DEXTROSE 360-4.14 MG/200ML-% IV SOLN
30.0000 mg/h | INTRAVENOUS | Status: DC
  Administered 2022-07-04 (×2): 30 mg/h via INTRAVENOUS
  Filled 2022-07-03: qty 200

## 2022-07-03 MED ORDER — LACTULOSE 10 GM/15ML PO SOLN
20.0000 g | Freq: Once | ORAL | Status: AC
  Administered 2022-07-03: 20 g
  Filled 2022-07-03: qty 30

## 2022-07-03 NOTE — Progress Notes (Addendum)
eLink Physician-Brief Progress Note Patient Name: Darrell Horne DOB: 20-Jan-1946 MRN: [id number]   Date of Service  07/03/2022  HPI/Events of Note  Pt agitated and was tyring to get out of bed.  Precedex gtt increased.   MAP 76, HR 98, RR 28, O2 sats 100%.   eICU Interventions  Get ABG to ensure patient is protecting his airway.      Intervention Category Intermediate Interventions: Other:  Elsie Lincoln 07/03/2022, 10:58 PM  12:27 AM Pt with BP 85/66.  Plan> Add neosynephrine gtt peripherally until central line is obtained.   2:48 AM ABG 7.424/34.8/92.  Pt is resting while on precedex gtt.  He is mildly tachypneic.  He was able to lay flat.  Plan> Pt needs US guided peripheral line placed. Get CT head.  Ativan PRN as needed to get CT head.

## 2022-07-03 NOTE — Significant Event (Addendum)
Rapid Response Event Note   Reason for Call :  Pt seen on rounds.  Initial Focused Assessment:  Pt lying in bed with eye closed. Pt restless, moans intermittently. Does not follow commands but moves all extremities. Breathing is labored. Lungs rhonchi/rales t/o.  Skin hot to touch.  T-100.5(Ax), HR-130s, BP-112/77, RR-38, SpO2-82-90% on 9L HFNC.  Pt increased to 15L HFNC-90%. Pt placed on NRB-100%.  Interventions:  Amiodarone '150mg'$  bolus, gtt started at '60mg'$  NRB ABG-7.44/36/166/24.2 Ammonia PT RVP/droplet precautions New PIV Foley inserted PCCM consult: Tx to 3M05 EKG CBC/CMP/Mg/Phos/PT/Pct Precedex once in ICU Plan of Care:  Tx to 3M07. Precedex on arrival. Probable intubation tonight.   Event Summary:   MD Notified: Dr. Florene Glen at bedside on floor at my arrival, PCCM consulted and came to bedside.  Call [place]) [place]  Dillard Essex, RN

## 2022-07-03 NOTE — Progress Notes (Signed)
RT responded to a rapid response due to confusion and respiratory distress.

## 2022-07-03 NOTE — Progress Notes (Signed)
  Amiodarone Drug - Drug Interaction Consult Note  Recommendations: Watch Qtc - on concurrent amitriptyline which has a conditional risk for Qtc prolongation.   Amiodarone is metabolized by the cytochrome P450 system and therefore has the potential to cause many drug interactions. Amiodarone has an average plasma half-life of 50 days (range 20 to 100 days).   There is potential for drug interactions to occur several weeks or months after stopping treatment and the onset of drug interactions may be slow after initiating amiodarone.   '[x]'$  Statins: Increased risk of myopathy. Simvastatin- restrict dose to '20mg'$  daily. Other statins: counsel patients to report any muscle pain or weakness immediately.  '[x]'$  Anticoagulants: Amiodarone can increase anticoagulant effect. Consider warfarin dose reduction. Patients should be monitored closely and the dose of anticoagulant altered accordingly, remembering that amiodarone levels take several weeks to stabilize.  '[]'$  Antiepileptics: Amiodarone can increase plasma concentration of phenytoin, the dose should be reduced. Note that small changes in phenytoin dose can result in large changes in levels. Monitor patient and counsel on signs of toxicity.  '[]'$  Beta blockers: increased risk of bradycardia, AV block and myocardial depression. Sotalol - avoid concomitant use.  '[]'$   Calcium channel blockers (diltiazem and verapamil): increased risk of bradycardia, AV block and myocardial depression.  '[]'$   Cyclosporine: Amiodarone increases levels of cyclosporine. Reduced dose of cyclosporine is recommended.  '[]'$  Digoxin dose should be halved when amiodarone is started.  '[]'$  Diuretics: increased risk of cardiotoxicity if hypokalemia occurs.  '[]'$  Oral hypoglycemic agents (glyburide, glipizide, glimepiride): increased risk of hypoglycemia. Patient's glucose levels should be monitored closely when initiating amiodarone therapy.   '[]'$  Drugs that prolong the QT interval:   Torsades de pointes risk may be increased with concurrent use - avoid if possible.  Monitor QTc, also keep magnesium/potassium WNL if concurrent therapy can't be avoided.  Antibiotics: e.g. fluoroquinolones, erythromycin.  Antiarrhythmics: e.g. quinidine, procainamide, disopyramide, sotalol.  Antipsychotics: e.g. phenothiazines, haloperidol.   Lithium, tricyclic antidepressants, and methadone.  Thank You,  Antonietta Jewel, PharmD, [place] Clinical Pharmacist  Phone: [phone] 07/03/2022 7:52 PM  Please check AMION for all [place] phone numbers After 10:00 PM, call [place] [phone]

## 2022-07-03 NOTE — Progress Notes (Signed)
NAME:  Darrell Horne, MRN:  [id number], DOB:  [birth year], LOS: 100 ADMISSION DATE:  06/16/2022, CONSULTATION DATE:  06/22/2022 REFERRING MD:  Dr. Louanne Belton, CHIEF COMPLAINT:  Acute Hypoxic Respiratory Failure, Pleural Effusion   History of Present Illness:  Darrell Horne is a 76 year old male with a past medical history of paroxysmal A-fib on Eliquis, left BKA, HTN, HLD, and alcoholic cirrhosis who presented to the hospital with altered mental status and was admitted on 06/16/2022.  He was found to have a large right-sided pleural effusion and his intermittently increased since August 26.  Thoracentesis on 06/17/2022 81 with 2 L of yellow fluid was found to be transudative pleural effusion.  Initially he did well after the procedure but over the past couple days has had increased oxygen requirements and episodes of hypotension.  Hypotension was treated with albumin and midodrine.  PCCM was consulted for further evaluation and management of his right-sided pleural effusion and acute hypoxic respiratory failure.  Noted to have cirrhosis on imaging in 2015.  Right upper quadrant ultrasound this admission with cirrhotic liver but only trace ascites  Pertinent  Medical History    has a past medical history of Chest pain, Diabetes mellitus, DM2 (diabetes mellitus, type 2) ([place]), GERD (gastroesophageal reflux disease), HLD (hyperlipidemia), HTN (hypertension), PAF (paroxysmal atrial fibrillation) ([place]), and Palpitations.   has a past surgical history that includes Total ankle arthroplasty ([id number]); Cholecystectomy; Shoulder surgery ([id number]); Hernia repair; Appendectomy; Leg amputation (Left, [id number]); Colonoscopy; Colonoscopy with propofol (N/A, 11/06/2018); polypectomy (11/06/2018); and IR THORACENTESIS ASP PLEURAL SPACE W/IMG GUIDE (06/17/2022).    Significant Hospital Events: Including procedures, antibiotic start and stop dates in addition to other pertinent events   9/8 thoracentesis with return of 1.2 L of  transudative fluid 06/18/2022: Stage II partial-thickness left medial buttock pressure injury noted low 9/12 increasing oxygen requirements with up to 6 L high flow nasal cannula and episodes of hypotension with systolic of 89.  Albumin, midodrine, and tube feeds started.  Core track placed. 9/13 PCCM consulted, Eliquis held for planned thoracentesis tomorrow. CXR with stable pulm edema and pleural effusions 9/14 increased O2 requirements, stable on BiPAP now. Bilateral chest tubes placed with return of 6 L of yellow fluid. Off BiPAP 9/15 doing well off BiPAP overnight, on 5L [place], slowed drainage from bilat chest tubes, drastically improved peripheral edema.  9/16 off bipap. Remains off pressors. Stable to transfer out of ICU  9/19 - Lactulose held due to diarrhea . More responsive today. Bilateral chest tubes clamped since 9/18.  Transudative pleural effusion deemed due to cirrhosis and cardiac impairment.  Removal of chest tubes.  Oxygen level 96% on room air.  With diuresis he is -18 L.  Delirium thought to be slowly improving ammonia level 20.  Lactulose is being continued.  For A-fib oral amiodarone and oral metoprolol continued with Eliquis.  Being maintained on midodrine as well.   Critical care medicine signed off 06/29/2022: Continue diuresis.  Improved alertness noticed. 07/01/2022: Worsening confusion but also fluctuating confusion.  Continue diuresis. 07/02/2022: Lethargic worsening confusion.  Afebrile per hospitalist  Interim History / Subjective:    9/24 -new onset fever since 07/03/2019 300.8.  Worsening hypoxemia with rapid escalation from 4 L yesterday according to hospitalist to 10 L facemask today.  Chest x-ray with bilateral acute lung injury and small pleural effusions started on antibiotics and pulmonary called.  Encephalopathy worse in the last few days.  Critical care medicine reconsulted.  Objective   Blood pressure 107/63, pulse Marland Kitchen)  118, temperature (!) 100.5 F (38.1 C),  temperature source Axillary, resp. rate (!) 25, height '5\' 10"'$  (1.778 m), weight 76.2 kg, SpO2 96 %.        Intake/Output Summary (Last 24 hours) at 07/03/2022 2105 Last data filed at 07/03/2022 1100 Gross per 24 hour  Intake 1124.17 ml  Output 900 ml  Net 224.17 ml   Filed Weights   07/01/22 0621 07/02/22 0351 07/03/22 0342  Weight: 78.4 kg 77.7 kg 76.2 kg   Examination: Emaciated cachectic male seated in the bed in 4 E. 11.  He has facemask on.  He seems to have obtunded encephalopathy.  He has left BKA.  No obvious ascites but he has obese abdomen.  Clear to auscultation but in some respiratory distress without accessory muscle use.  Tachycardic.  Diffuse muscle wasting present.  Resolved Hospital Problem list     Assessment & Plan:   Bilateral pleural effusion, transudate -status post bilateral chest tubes 06/24/2022 - 06/28/2022 R apical ptx, small -following removal of chest tubes resolved  Acute respiratory failure with hypoxia   -present on admission due to bilateral effusion and then resolved -Recurrence 07/03/2022.  Moderate to severe with facemask oxygen need rapidly escalating with bilateral diffuse infiltrates along with small pleural effusion suggestive of acute lung injury.  Concern for HAP   P -Coontinue baseline aldactone diuresis but might have to hold if goes into shock -Facemask oxygen for pulse ox greater than 92% - Intubate if you worse or use BiPAP depending on mental status  Concern for sepsis with SIRS physiology -fever 07/02/2022 and 07/03/2022 with elevated white count 07/03/2022 .  Suspect hospital-acquired pneumonia based on chest x-ray worsening  Plan -Ceftriaxone 06/22/2022 - 06/23/2022 - Azithromycin 06/22/2022 - 06/23/2022 xxxx -Cefepime 06/23/2022 - 06/25/2022, -Ceftriaxone 06/25/2022 - 06/29/2022 xxxxx - Vancomycin 07/03/2022 - 07/03/2022 - Cefepime 07/03/22 >>  -Blood culture - Lactic acid, procalcitonin -Tracheal aspirate if he gets intubated - Urine  analysis     Portal Hypertension Cirrhosis, ascites, pleural effusion, volume overload - Cirrhosis first documented 2015 on CT imaging. - Ultrasound liver 9/17> cirrhotic morphology, s/p cholecystectomy, trace ascites, incidental right pleural effusion  07/03/2022 -MELD score 12 points based on INR from August 2023.Marland Kitchen  Normal ammonia 06/29/2022  P - CTX for SBP ppx,  - lactulose  - Diuretic for hepatic hydrothorax and pleural effusions.  Home topiramate - for headaches - Present on Admit Home Elavil and gabapentin - per wife due to Neuropathy Acute metabolic encephalopathy - hypoactive ICU delirium - Present on Admit  07/03/2022: Worsening obtunded encephalopathy times few days.  Likely due to sepsis  P -Start Precedex - Delirium precautions - STOP amitriptyline/Elavil but monitor QTc - stOP Topiramate -Continue to hold home gabapentin -Continue lactulose - Monitor - Intubate if needed  Paroxysmal Afib, rate controlled -on home amiodarone and metoprolol RV dysfunction 0- mild Mild to Moderate Mitral regug -CHA2DS2-VASc score of 4.    - 9/24 -maintaining blood pressure on metoprolol amiodarone and Eliquis [also on midodrine]  P - Eliquis, amiodarone via infusion, metoprolol  - remains rate controlled -hold metoprolol while on Precedex -Monitor QTc while on Elavil and amiodarone -get stat ekg  Hx HTN  -at home on metoprolol History of hyperlipidemia on statin  P -Continue midodrine -DC statin  - MAP goal > 65    Chronic anemia - CBC stable  Plan  - - PRBC for hgb </= 6.9gm%    - exceptions are   -  if ACS  susepcted/confirmed then transfuse for hgb </= 8.0gm%,  or    -  active bleeding with hemodynamic instability, then transfuse regardless of hemoglobin value   At at all times try to transfuse 1 unit prbc as possible with exception of active hemorrhage    GERD - PPI   Hyperlipidemia - statin  Malnutrition -severe protein  calorie Debility Deconditioning  - TF per [place], diet as mental status allows -but hold on 07/03/2022 due to worsening encephalopathy and transferred to ICU - PT  Pressure injury stage II left medial buttock - Present on Admit  Plan  =- per wound care   Best Practice (right click and "Reselect all SmartList Selections" daily)   Diet/type: tubefeeds per cort[place] - hold 07/03/22 DVT prophylaxis: DOAC - HOLD due to encephalopathy 07/03/22 and get CT head GI prophylaxis: PPI Lines: N/A - [picc oprdered 89/2423 Foley:  N/A Code Status:  full code Last date of multidisciplinary goals of care discussion [9/14] - wife updated over phone 07/03/22       ATTESTATION & SIGNATURE   The patient Darrell Horne is critically ill with multiple organ systems failure and requires high complexity decision making for assessment and support, frequent evaluation and titration of therapies, application of advanced monitoring technologies and extensive interpretation of multiple databases.   Critical Care Time devoted to patient care services described in this note is  70 Minutes. This time reflects time of care of this signee Dr Brand Males. This critical care time does not reflect procedure time, or teaching time or supervisory time of PA/NP/Med student/Med Resident etc but could involve care discussion time     Dr. Brand Males, M.D., [facility].C.P Pulmonary and Critical Care Medicine Medical Director - [facility] ICU Staff Physician, [place] [facility] Pager: [phone], If no answer or between  15:00h - 7:00h: call [phone]  07/03/2022 9:05 PM    LABS    PULMONARY Recent Labs  Lab 06/29/22 1643 07/02/22 1826 07/03/22 0850  PHART  --   --  7.44  PCO2ART  --   --  36  PO2ART  --   --  166*  HCO3 36.3* 27.9 24.2  O2SAT 20.1 50.4 99.1    CBC Recent Labs  Lab 07/01/22 0757 07/02/22 0244 07/03/22 0056  HGB 10.2* 10.6* 10.0*   HCT 30.5* 31.9* 29.3*  WBC 6.9 10.0 14.9*  PLT 107* 107* 165    COAGULATION No results for input(s): "INR" in the last 168 hours.  CARDIAC  No results for input(s): "TROPONINI" in the last 168 hours. No results for input(s): "PROBNP" in the last 168 hours.   CHEMISTRY Recent Labs  Lab 06/29/22 0935 06/30/22 0443 07/01/22 0326 07/01/22 0757 07/02/22 0244 07/03/22 0056  NA 139 138  --  135 133* 137  K 3.7 3.7  --  4.2 4.1 4.3  CL 102 102  --  104 105 105  CO2 31 28  --  '26 22 23  '$ GLUCOSE 165* 138*  --  149* 145* 165*  BUN 26* 25*  --  22 27* 40*  CREATININE 0.79 0.77  --  0.73 0.76 0.89  CALCIUM 8.7* 9.0  --  8.8* 9.1 9.2  MG 2.2 2.2 2.3 2.2 2.2 2.3  PHOS  --  3.0  --  2.7 2.6 3.1   Estimated Creatinine Clearance: 72.9 mL/min (by C-G formula based on SCr of 0.89 mg/dL).   LIVER Recent Labs  Lab 06/29/22 0935 06/30/22 0443 07/01/22 0757 07/02/22 0244 07/03/22 0056  AST 172* 149* 99* 77* 85*  ALT 112* 109* 83* 68* 78*  ALKPHOS 236* 240* 207* 196* 200*  BILITOT 0.6 0.8 0.9 1.6* 2.0*  PROT 5.4* 5.6* 5.8* 6.1* 6.3*  ALBUMIN 2.0* 2.3* 2.8* 3.3* 3.1*     INFECTIOUS No results for input(s): "LATICACIDVEN", "PROCALCITON" in the last 168 hours.   ENDOCRINE CBG (last 3)  Recent Labs    07/03/22 1120 07/03/22 1637 07/03/22 1950  GLUCAP 177* 172* 183*         IMAGING x48h  - image(s) personally visualized  -   highlighted in bold DG CHEST PORT 1 VIEW  Result Date: 07/03/2022 CLINICAL DATA:  Altered mental status and fever EXAM: PORTABLE CHEST 1 VIEW COMPARISON:  Radiographs 07/02/2022 FINDINGS: Subdiaphragmatic enteric tube. Increased hazy opacities in both lungs since 07/02/2022. Probable small bilateral pleural effusions. Stable cardiomediastinal silhouette. No acute osseous abnormality. IMPRESSION: Increasing diffuse airspace opacities bilaterally. Differential considerations include infection or edema. Probable small bilateral pleural effusions.  Electronically Signed   By: Placido Sou M.D.   On: 07/03/2022 19:41   MR BRAIN WO CONTRAST  Result Date: 07/02/2022 CLINICAL DATA:  Delirium. EXAM: MRI HEAD WITHOUT CONTRAST TECHNIQUE: Multiplanar, multiecho pulse sequences of the brain and surrounding structures were obtained without intravenous contrast. COMPARISON:  CT head without contrast 06/16/2022. MR head without contrast 08/05/2015 FINDINGS: Brain: No acute infarct, hemorrhage, or mass lesion is present. Moderate generalized atrophy has progressed since 2016. Mild white matter changes are stable and likely within normal limits for age. The ventricles are proportionate to the degree of atrophy. No significant extraaxial fluid collection is present. Dilated perivascular spaces are present within the basal ganglia. The internal auditory canals are within normal limits. The brainstem and cerebellum are within normal limits. Vascular: Flow is present in the major intracranial arteries. Skull and upper cervical spine: Degenerative changes are present at C2-3. Craniocervical junction is normal. Marrow signal is normal. The patient is edentulous. Sinuses/Orbits: The paranasal sinuses and mastoid air cells are clear. Bilateral lens replacements are noted. Globes and orbits are otherwise unremarkable. IMPRESSION: 1. No acute intracranial abnormality. 2. Progressive moderate generalized atrophy. This likely reflects the sequela of chronic microvascular ischemia. Electronically Signed   By: San Morelle M.D.   On: 07/02/2022 17:40   DG CHEST PORT 1 VIEW  Result Date: 07/02/2022 CLINICAL DATA:  10026 altered mental status EXAM: PORTABLE CHEST 1 VIEW COMPARISON:  September 20-22, 2023 FINDINGS: Evaluation is limited by patient rotation. Cardiomediastinal silhouette is grossly unchanged. The enteric tube courses through the chest to the abdomen beyond the field-of-view. There is diffuse hazy airspace opacities bilaterally with an upper lung  predominance, overall increased since September 20th. There is increased confluent opacity of bilateral apices. A definitive pneumothorax is not identified. Likely small bilateral pleural effusions. IMPRESSION: Increasing upper lobe predominant opacities bilaterally with increased apical confluence. Differential considerations include infection and atypical pulmonary edema. Electronically Signed   By: Valentino Saxon M.D.   On: 07/02/2022 12:07

## 2022-07-03 NOTE — Progress Notes (Signed)
Per bedside report patient's o2 requirement has been going up. Pt was sating 96% in 9l HFNC on assessment. Pt did open his eyes when called his name, but did not follow command after that. He was moving his both arms and right leg. Chest x-ray done at bedside. HR-AFIb RVR with HR 110-128.  2005: [facility] RN was rounding on the floor. On bedside with me. Amio drip started at 2008 after Amio bolus. DR. Florene Glen came by bedside, he said he had notified PCCM and he will be coming to see the patient. Patient's sat was dropping, increased to 15 lit HFNC and eventually to 100% non rebreather.  2055: PCCM DR. Ramaswamy by bedside. Pt is transferring to ICU per MD.  2105: Report called to [facility] RN, pt is transferring to 3M05.  2123: patient transferred to 3M05 via bed on 100% non-rebreather with [facility] RN.

## 2022-07-03 NOTE — Progress Notes (Addendum)
PROGRESS NOTE    Darrell Horne  VZD:[id number] DOB: 01/30/1946 DOA: 06/16/2022 PCP: Darrell Hughs, NP  Chief Complaint  Patient presents with   Altered Mental Status    Brief Narrative:  Darrell Horne is Darrell Horne 76 year old male with Darrell Horne past medical history of paroxysmal Darrell Horne-fib on Eliquis, left BKA, HTN, HLD, and alcoholic cirrhosis who presented to the hospital with altered mental status and was admitted on 06/16/2022.  He was found to have Darrell Horne large right-sided pleural effusion and his intermittently increased since August 26.  Thoracentesis on 06/17/2022 81 with 2 L of yellow fluid was found to be transudative pleural effusion.  Initially he did well after the procedure but over the past couple days has had increased oxygen requirements and episodes of hypotension.  Hypotension was treated with albumin and midodrine.  PCCM was consulted for further evaluation and management of his right-sided pleural effusion and acute hypoxic respiratory failure. Patient underwent thoracentesis with transudative fluid noted, noted to have increased O2 requirements and hypotensive, requiring albumin midodrine and tube feeds started.  Eliquis was also started.  Patient placed on diuretics.  Patient transferred out of the ICU as he no longer required BiPAP and transferred to [facility] service.  PCCM following for chest tube management.   Assessment & Plan:   Principal Problem:   Acute encephalopathy Active Problems:   PAF (paroxysmal atrial fibrillation) (HCC)   DM2 (diabetes mellitus, type 2) (HCC)   HTN (hypertension)   HLD (hyperlipidemia)   Hx of BKA, left (HCC)   Overdose by ingestion   Pleural effusion on right   Pressure injury of skin   Anemia   Protein-calorie malnutrition, severe   Acute respiratory failure with hypoxia (HCC)   Bilateral pleural effusion  PCCM to take to ICU Notified wife.  Assessment and Plan: Fever  Pneumonia Fever to 100.8 yesteray CXR with increasing upper lobe predominant  opacities bilaterally with increased apical confluence, infection vs atypical pulm edema Cefepime.  D/c vanc with negative MRSA pcr Negative covid, influenza Blood cx pending Consider additional imaging - addendum I'd ordered CT CAP, not sure he'll tolerate this and I think CXR likely shows primary process going on.  We'll hold off for now as we treat suspected pneumonia and volume overload.   Acute Respiratory Failure with Hypoxia Bilateral Pleural Effusions Status Post Bilateral Chest Tube Placement Right hydropneumothorax  Left Apical Pneumothorax -Patient status post bilateral chest tube placement per PCCM on 9/14 -chest tubes d/c'd 9/19 -CXR 9/22 with minimal l apical pneumothorax, perihilar vascular congestion with mild central edema, increased pleural effusions, R apical ptx not seen -CXR 9/23 with upper lobe predominant opacities bilaterally -> Infection vs atypical pulm edema? -Pulm recommending focus on diuresis -increase lasix to 80 mg BID, continue spironolactone -net negative 20 L  -Pleural effusion noted to be transudative felt likely secondary to hepatic hydrothorax given patient's history of cirrhosis Addendum: worsening hypoxia throughout the day, repeat CXR pending, BNP elevated.  Currently treating for pneumonia and volume overload as well, increase lasix to 100 mg and q8 hr dosing.  Foley placement for strict I/O.  Will touch base with pulmonary.   2.  Cirrhosis/ascites/volume overload/pleural effusion/portal hypertension -Patient noted to have cirrhosis documented on CT imaging on 2015. -Right upper quadrant ultrasound done with cirrhotic morphology of the liver, no focal lesion identified, status postcholecystectomy, trace ascites, incidental note of right pleural effusion. -Continue spironolactone, Lasix, lactulose.  Stop ceftriaxone.  -Will need outpatient follow-up with GI.   3.  Acute metabolic encephalopathy/possible hypoactive ICU delirium/probable hepatic  encephalopathy -Patient currently on delirium precautions. -Confusion is persistent, fluctuating, generally worse today -> due to infectious process above? -b12 wnl, folate wnl, repeat ammonia wnl, VBG yesterday without hypercarbia (repeat ammonia) -high dose thiamine empirically   -Continue lactulose 20 g daily per tube -follow MRI brain -> without acute abnormality, moderate generalized atrophy   4.  Paroxysmal atrial fibrillation with RVR -continued RVR today at times, will continue amiodarone and metop PO as tolerated, add IV metop prn with holding parameters -- addendum: starting IV amiodarone infusion given continued RVR, discussed with cards on call -[id number] score 4 -Continue amiodarone 200 mg daily, metoprolol 12.5 mg daily for rate control. -Eliquis for anticoagulation. -Follow LFTs as patient on amiodarone per cardiology recommendations. -Cardiology was following but have signed off as of 06/25/2022. -Outpatient follow-up with cardiology.   5.  hypotension -BP borderline. -Continue midodrine 3 times daily -> titrate prn, s/p albumin -Continue IV Lasix daily cautiously  -Monitor on beta-blocker as well (with holding parameters)   6.  GERD -PPI.     7.  Hyperlipidemia -Statin.   8.  Chronic anemia -Follow H&H. -relatively stable -Transfusion threshold hemoglobin < 7.   9.  Malnutrition/debility/deconditioning -Continue current tube feeds. -PT/OT when patient more alert.   10.  Hypokalemia -Likely secondary to diuresis. -Follow.    11. Pressure injury, POA Pressure Injury 06/18/22 Buttocks Left;Medial Stage 2 -  Partial thickness loss of dermis presenting as Darrell Horne shallow open injury with Darrell Horne red, pink wound bed without slough. blister that burst (Active)  06/18/22 0700  Location: Buttocks  Location Orientation: Left;Medial  Staging: Stage 2 -  Partial thickness loss of dermis presenting as Darrell Horne shallow open injury with Darrell Horne red, pink wound bed without slough.  Wound  Description (Comments): blister that burst  Present on Admission: Yes      DVT prophylaxis: SCD, eliquis Code Status: full Family Communication: none at bedside Disposition:   Status is: Inpatient Remains inpatient appropriate because: need for continued inpatuient care   Consultants:  [place] RN 06/21/2022 PCCM: Timmothy Euler 06/22/2022 Cardiology: Dr. Angelena Form 06/22/2022 Palliative care Dr. Hilma Favors 06/23/2022  Procedures:  Thoracentesis 9/8 Bilateral chest tube placement 9/14, removal 9/19 Cortrack placement 9/12  Antimicrobials:  Anti-infectives (From admission, onward)    Start     Dose/Rate Route Frequency Ordered Stop   06/25/22 1200  cefTRIAXone (ROCEPHIN) 1 g in sodium chloride 0.9 % 100 mL IVPB  Status:  Discontinued        1 g 200 mL/hr over 30 Minutes Intravenous Every 24 hours 06/25/22 1131 06/29/22 1010   06/23/22 1400  ceFEPIme (MAXIPIME) 2 g in sodium chloride 0.9 % 100 mL IVPB  Status:  Discontinued        2 g 200 mL/hr over 30 Minutes Intravenous Every 8 hours 06/23/22 1045 06/25/22 1131   06/23/22 1000  ceFEPIme (MAXIPIME) 1 g in sodium chloride 0.9 % 100 mL IVPB  Status:  Discontinued        1 g 200 mL/hr over 30 Minutes Intravenous Every 12 hours 06/23/22 0529 06/23/22 1045   06/23/22 1000  linezolid (ZYVOX) tablet 600 mg  Status:  Discontinued        600 mg Per Tube Every 12 hours 06/23/22 0529 06/24/22 1100   06/22/22 1145  cefTRIAXone (ROCEPHIN) 1 g in sodium chloride 0.9 % 100 mL IVPB  Status:  Discontinued        1 g 200 mL/hr over 30 Minutes  Intravenous Every 24 hours 06/22/22 1055 06/23/22 0529   06/22/22 1145  azithromycin (ZITHROMAX) 500 mg in sodium chloride 0.9 % 250 mL IVPB  Status:  Discontinued        500 mg 250 mL/hr over 60 Minutes Intravenous Every 24 hours 06/22/22 1055 06/23/22 0529       Subjective: More alert, difficult to understand, answers some questions  Objective: Vitals:   07/02/22 0351 07/02/22 0400 07/02/22 0732 07/02/22 1137   BP: (!) 101/59 103/67 (!) 108/59 102/73  Pulse: 100 61 (!) 126 (!) 108  Resp: 16 (!) 21 13 (!) 26  Temp: 99 F (37.2 C)  98.3 F (36.8 C) 98.3 F (36.8 C)  TempSrc: Axillary  Axillary Oral  SpO2: 90% 91% 93% 90%  Weight: 77.7 kg     Height:        Intake/Output Summary (Last 24 hours) at 07/02/2022 1736 Last data filed at 07/02/2022 1140 Gross per 24 hour  Intake 1977.42 ml  Output 1245 ml  Net 732.42 ml   Filed Weights   06/28/22 0337 07/01/22 0621 07/02/22 0351  Weight: 77.7 kg 78.4 kg 77.7 kg    Examination:  General: No acute distress. Cardiovascular: RRR Lungs: unlabored Abdomen: Soft, nontender, nondistended  Neurological: continued encephalopathy, more alert than yesterday, answers simple questions, still difficult to understand Extremities: No clubbing or cyanosis. some dependent edema  Data Reviewed: I have personally reviewed following labs and imaging studies  CBC: Recent Labs  Lab 06/28/22 0419 06/29/22 0935 06/30/22 0443 07/01/22 0757 07/02/22 0244  WBC 7.5 7.5 6.9 6.9 10.0  NEUTROABS  --   --  4.6 5.0 7.4  HGB 10.9* 10.9* 10.5* 10.2* 10.6*  HCT 33.1* 34.1* 31.3* 30.5* 31.9*  MCV 102.2* 103.0* 100.0 99.3 99.7  PLT PLATELET CLUMPS NOTED ON SMEAR, UNABLE TO ESTIMATE PLATELET CLUMPS NOTED ON SMEAR, UNABLE TO ESTIMATE PLATELET CLUMPS NOTED ON SMEAR, UNABLE TO ESTIMATE 107* 107*    Basic Metabolic Panel: Recent Labs  Lab 06/29/22 0059 06/29/22 0935 06/30/22 0443 07/01/22 0326 07/01/22 0757 07/02/22 0244  NA 141 139 138  --  135 133*  K 4.3 3.7 3.7  --  4.2 4.1  CL 105 102 102  --  104 105  CO2 32 31 28  --  26 22  GLUCOSE 157* 165* 138*  --  149* 145*  BUN 27* 26* 25*  --  22 27*  CREATININE 0.78 0.79 0.77  --  0.73 0.76  CALCIUM 8.8* 8.7* 9.0  --  8.8* 9.1  MG 2.4 2.2 2.2 2.3 2.2 2.2  PHOS  --   --  3.0  --  2.7 2.6    GFR: Estimated Creatinine Clearance: 81.1 mL/min (by C-G formula based on SCr of 0.76 mg/dL).  Liver Function  Tests: Recent Labs  Lab 06/28/22 0419 06/29/22 0935 06/30/22 0443 07/01/22 0757 07/02/22 0244  AST 164* 172* 149* 99* 77*  ALT 88* 112* 109* 83* 68*  ALKPHOS 234* 236* 240* 207* 196*  BILITOT 0.6 0.6 0.8 0.9 1.6*  PROT 5.3* 5.4* 5.6* 5.8* 6.1*  ALBUMIN 2.0* 2.0* 2.3* 2.8* 3.3*    CBG: Recent Labs  Lab 07/01/22 2316 07/02/22 0354 07/02/22 0757 07/02/22 1136 07/02/22 1645  GLUCAP 132* 154* 173* 151* 138*     Recent Results (from the past 240 hour(s))  MRSA Next Gen by PCR, Nasal     Status: None   Collection Time: 06/23/22 10:21 AM   Specimen: Nasal Mucosa; Nasal Swab  Result  Value Ref Range Status   MRSA by PCR Next Gen NOT DETECTED NOT DETECTED Final    Comment: (NOTE) The GeneXpert MRSA Assay (FDA approved for NASAL specimens only), is one component of Terea Neubauer comprehensive MRSA colonization surveillance program. It is not intended to diagnose MRSA infection nor to guide or monitor treatment for MRSA infections. Test performance is not FDA approved in patients less than 69 years old. Performed at [facility], [place] [address] [place], [place] [address]          Radiology Studies: DG CHEST PORT 1 VIEW  Result Date: 07/02/2022 CLINICAL DATA:  10026 altered mental status EXAM: PORTABLE CHEST 1 VIEW COMPARISON:  September 20-22, 2023 FINDINGS: Evaluation is limited by patient rotation. Cardiomediastinal silhouette is grossly unchanged. The enteric tube courses through the chest to the abdomen beyond the field-of-view. There is diffuse hazy airspace opacities bilaterally with an upper lung predominance, overall increased since September 20th. There is increased confluent opacity of bilateral apices. Gabriel Conry definitive pneumothorax is not identified. Likely small bilateral pleural effusions. IMPRESSION: Increasing upper lobe predominant opacities bilaterally with increased apical confluence. Differential considerations include infection and atypical pulmonary edema.  Electronically Signed   By: Valentino Saxon M.D.   On: 07/02/2022 12:07   DG CHEST PORT 1 VIEW  Result Date: 07/01/2022 CLINICAL DATA:  Follow-up abnormal chest x-ray. EXAM: PORTABLE CHEST 1 VIEW COMPARISON:  June 30, 2022 FINDINGS: There is stable nasogastric tube positioning. The heart size and mediastinal contours are within normal limits. Mild infiltrates are seen within the mid to upper right lung and left lung base. This is mildly increased in severity when compared to the prior study. Small bilateral pleural effusions are noted. Trine Fread tiny, stable left apical pneumothorax is seen. The visualized skeletal structures are unremarkable. IMPRESSION: 1. Mild mid to upper right lung and left basilar infiltrates, mildly increased in severity when compared to the prior study. 2. Stable tiny left apical pneumothorax. 3. Small bilateral pleural effusions. Electronically Signed   By: Virgina Norfolk M.D.   On: 07/01/2022 17:26        Scheduled Meds:  amiodarone  200 mg Per Tube BID   amitriptyline  50 mg Per Tube QHS   apixaban  5 mg Per Tube BID   atorvastatin  40 mg Per Tube Daily   Chlorhexidine Gluconate Cloth  6 each Topical Daily   feeding supplement (OSMOLITE 1.5 CAL)  1,000 mL Per Tube Q24H   feeding supplement (PROSource TF20)  60 mL Per Tube Daily   folic acid  1 mg Per Tube Daily   free water  100 mL Per Tube Q4H   furosemide  80 mg Intravenous Daily   lactulose  20 g Per Tube Daily   metoprolol tartrate  12.5 mg Per Tube BID   midodrine  10 mg Per Tube TID WC   multivitamin with minerals  1 tablet Per Tube Daily   mupirocin cream   Topical Daily   polyethylene glycol  17 g Per Tube Daily   spironolactone  100 mg Per Tube Daily   [START ON 07/07/2022] thiamine  100 mg Oral Daily   topiramate  75 mg Per Tube Daily   zinc sulfate  220 mg Per Tube Daily   Continuous Infusions:  sodium chloride 250 mL (06/28/22 1247)   albumin human 60 mL/hr at 07/01/22 2310   thiamine  (VITAMIN B1) injection 250 mg (07/02/22 1731)     LOS: 16 days  Time spent: over 30 min    Fayrene Helper, MD [facility]   To contact the attending provider between 7A-7P or the covering provider during after hours 7P-7A, please log into the web site [email or web address] and access using universal [place] password for that web site. If you do not have the password, please call the hospital operator.  07/02/2022, 5:36 PM

## 2022-07-03 NOTE — Progress Notes (Addendum)
eLink Physician-Brief Progress Note Patient Name: Darrell Horne DOB: [birth year] MRN: [id number]   Date of Service  07/03/2022  HPI/Events of Note  75/M with paroxysmal afib on eliquis, s/p left BKA, alcoholic cirrhosis admitted initially on 06/16/22, found to have right sided effusion.  He had thoracentesis on 06/17/22 draining 2L of transudative pleural effusion.  Pt was managed for pleural effusions with chest tubes which were discontinued on 06/28/22.  Pt has been diuresed.  Pt however with increasing O2 requirements throughout the day.  Lasix increased and patient started on empiric antibiotics Vanc and Cefepime.   CXR  Increasing diffuse airspace opacities bilaterally. Differential considerations include infection or edema. Probable small bilateral pleural effusions.  BP 96/58, HR 110s, RR 20s-32, O2 sats 100% on nasal cannula.  eICU Interventions  Pt is at risk for decompensation and may need intubation.  He is not appropriate for BIPAP. Continue on empiric antibiotics.  Start on lasix gtt.  Hold tube feeds now.      Intervention Category Evaluation Type: New Patient Evaluation  Elsie Lincoln 07/03/2022, 9:39 PM

## 2022-07-04 ENCOUNTER — Inpatient Hospital Stay (HOSPITAL_COMMUNITY): Payer: Medicare Other

## 2022-07-04 DIAGNOSIS — J9601 Acute respiratory failure with hypoxia: Secondary | ICD-10-CM | POA: Diagnosis not present

## 2022-07-04 DIAGNOSIS — I480 Paroxysmal atrial fibrillation: Secondary | ICD-10-CM | POA: Diagnosis not present

## 2022-07-04 DIAGNOSIS — J90 Pleural effusion, not elsewhere classified: Secondary | ICD-10-CM | POA: Diagnosis not present

## 2022-07-04 DIAGNOSIS — G9340 Encephalopathy, unspecified: Secondary | ICD-10-CM | POA: Diagnosis not present

## 2022-07-04 LAB — CBC WITH DIFFERENTIAL/PLATELET
Abs Immature Granulocytes: 0.1 10*3/uL — ABNORMAL HIGH (ref 0.00–0.07)
Basophils Absolute: 0 10*3/uL (ref 0.0–0.1)
Basophils Relative: 0 %
Eosinophils Absolute: 0 10*3/uL (ref 0.0–0.5)
Eosinophils Relative: 0 %
HCT: 26.2 % — ABNORMAL LOW (ref 39.0–52.0)
Hemoglobin: 8.8 g/dL — ABNORMAL LOW (ref 13.0–17.0)
Immature Granulocytes: 1 %
Lymphocytes Relative: 8 %
Lymphs Abs: 1.2 10*3/uL (ref 0.7–4.0)
MCH: 33.5 pg (ref 26.0–34.0)
MCHC: 33.6 g/dL (ref 30.0–36.0)
MCV: 99.6 fL (ref 80.0–100.0)
Monocytes Absolute: 1.6 10*3/uL — ABNORMAL HIGH (ref 0.1–1.0)
Monocytes Relative: 11 %
Neutro Abs: 12.3 10*3/uL — ABNORMAL HIGH (ref 1.7–7.7)
Neutrophils Relative %: 80 %
Platelets: 171 10*3/uL (ref 150–400)
RBC: 2.63 MIL/uL — ABNORMAL LOW (ref 4.22–5.81)
RDW: 14.8 % (ref 11.5–15.5)
WBC: 15.3 10*3/uL — ABNORMAL HIGH (ref 4.0–10.5)
nRBC: 0 % (ref 0.0–0.2)

## 2022-07-04 LAB — RESPIRATORY PANEL BY PCR

## 2022-07-04 LAB — GLUCOSE, CAPILLARY
Glucose-Capillary: 110 mg/dL — ABNORMAL HIGH (ref 70–99)
Glucose-Capillary: 101 mg/dL — ABNORMAL HIGH (ref 70–99)
Glucose-Capillary: 107 mg/dL — ABNORMAL HIGH (ref 70–99)
Glucose-Capillary: 108 mg/dL — ABNORMAL HIGH (ref 70–99)
Glucose-Capillary: 172 mg/dL — ABNORMAL HIGH (ref 70–99)
Glucose-Capillary: 172 mg/dL — ABNORMAL HIGH (ref 70–99)

## 2022-07-04 LAB — POCT I-STAT 7, (LYTES, BLD GAS, ICA,H+H)
Acid-Base Excess: 0 mmol/L (ref 0.0–2.0)
Bicarbonate: 25 mmol/L (ref 20.0–28.0)
Calcium, Ion: 1.33 mmol/L (ref 1.15–1.40)
HCT: 27 % — ABNORMAL LOW (ref 39.0–52.0)
Hemoglobin: 9.2 g/dL — ABNORMAL LOW (ref 13.0–17.0)
O2 Saturation: 100 %
Patient temperature: 97.2
Potassium: 3.7 mmol/L (ref 3.5–5.1)
Sodium: 140 mmol/L (ref 135–145)
TCO2: 26 mmol/L (ref 22–32)
pCO2 arterial: 41.4 mmHg (ref 32–48)
pH, Arterial: 7.385 (ref 7.35–7.45)
pO2, Arterial: 448 mmHg — ABNORMAL HIGH (ref 83–108)

## 2022-07-04 LAB — COMPREHENSIVE METABOLIC PANEL
ALT: 81 U/L — ABNORMAL HIGH (ref 0–44)
AST: 107 U/L — ABNORMAL HIGH (ref 15–41)
Albumin: 2.6 g/dL — ABNORMAL LOW (ref 3.5–5.0)
Alkaline Phosphatase: 201 U/L — ABNORMAL HIGH (ref 38–126)
Anion gap: 9 (ref 5–15)
BUN: 56 mg/dL — ABNORMAL HIGH (ref 8–23)
CO2: 23 mmol/L (ref 22–32)
Calcium: 9.3 mg/dL (ref 8.9–10.3)
Chloride: 106 mmol/L (ref 98–111)
Creatinine, Ser: 1.05 mg/dL (ref 0.61–1.24)
GFR, Estimated: >60 mL/min (ref >60)
Glucose, Bld: 133 mg/dL — ABNORMAL HIGH (ref 70–99)
Potassium: 3.9 mmol/L (ref 3.5–5.1)
Sodium: 138 mmol/L (ref 135–145)
Total Bilirubin: 1.8 mg/dL — ABNORMAL HIGH (ref 0.3–1.2)
Total Protein: 5.9 g/dL — ABNORMAL LOW (ref 6.5–8.1)

## 2022-07-04 LAB — MAGNESIUM: Magnesium: 2.5 mg/dL — ABNORMAL HIGH (ref 1.7–2.4)

## 2022-07-04 LAB — PROTIME-INR
INR: 2.1 — ABNORMAL HIGH (ref 0.8–1.2)
Prothrombin Time: 23.1 seconds — ABNORMAL HIGH (ref 11.4–15.2)

## 2022-07-04 LAB — AMMONIA: Ammonia: 49 umol/L — ABNORMAL HIGH (ref 9–35)

## 2022-07-04 LAB — LACTIC ACID, PLASMA: Lactic Acid, Venous: 1.1 mmol/L (ref 0.5–1.9)

## 2022-07-04 LAB — PROCALCITONIN: Procalcitonin: 1.13 ng/mL

## 2022-07-04 LAB — PHOSPHORUS: Phosphorus: 3.4 mg/dL (ref 2.5–4.6)

## 2022-07-04 MED ORDER — PANTOPRAZOLE 2 MG/ML SUSPENSION
40.0000 mg | Freq: Every day | ORAL | Status: DC
  Administered 2022-07-04 – 2022-07-11 (×8): 40 mg
  Filled 2022-07-04 (×9): qty 20

## 2022-07-04 MED ORDER — ORAL CARE MOUTH RINSE
15.0000 mL | OROMUCOSAL | Status: DC
  Administered 2022-07-04 (×2): 15 mL via OROMUCOSAL

## 2022-07-04 MED ORDER — LORAZEPAM 2 MG/ML IJ SOLN
1.0000 mg | Freq: Once | INTRAMUSCULAR | Status: DC | PRN
  Filled 2022-07-04: qty 1

## 2022-07-04 MED ORDER — PROSOURCE TF20 ENFIT COMPATIBL EN LIQD
60.0000 mL | Freq: Every day | ENTERAL | Status: DC
  Administered 2022-07-04 – 2022-07-12 (×9): 60 mL
  Filled 2022-07-04 (×9): qty 60

## 2022-07-04 MED ORDER — PHENYLEPHRINE HCL-NACL 20-0.9 MG/250ML-% IV SOLN
0.0000 ug/min | INTRAVENOUS | Status: DC
  Administered 2022-07-04: 100 ug/min via INTRAVENOUS
  Administered 2022-07-04: 80 ug/min via INTRAVENOUS
  Administered 2022-07-04: 20 ug/min via INTRAVENOUS
  Filled 2022-07-04 (×3): qty 250

## 2022-07-04 MED ORDER — ORAL CARE MOUTH RINSE: 15.0000 mL | OROMUCOSAL | Status: DC | PRN

## 2022-07-04 MED ORDER — METOPROLOL TARTRATE 5 MG/5ML IV SOLN
2.5000 mg | INTRAVENOUS | Status: DC | PRN
  Administered 2022-07-04: 5 mg via INTRAVENOUS
  Filled 2022-07-04: qty 5

## 2022-07-04 MED ORDER — POTASSIUM CHLORIDE 20 MEQ PO PACK: 40.0000 meq | PACK | Freq: Once | ORAL | Status: DC

## 2022-07-04 MED ORDER — OSMOLITE 1.5 CAL PO LIQD
1000.0000 mL | ORAL | Status: DC
  Administered 2022-07-04 – 2022-07-11 (×6): 1000 mL
  Filled 2022-07-04 (×5): qty 1000

## 2022-07-04 MED ORDER — APIXABAN 5 MG PO TABS: 5.0000 mg | ORAL_TABLET | Freq: Two times a day (BID) | ORAL | Status: DC

## 2022-07-04 MED ORDER — VANCOMYCIN HCL 1500 MG/300ML IV SOLN
1500.0000 mg | INTRAVENOUS | Status: DC
  Filled 2022-07-04: qty 300

## 2022-07-04 MED ORDER — BETHANECHOL CHLORIDE 10 MG PO TABS
10.0000 mg | ORAL_TABLET | Freq: Three times a day (TID) | ORAL | Status: DC
  Administered 2022-07-04 – 2022-07-07 (×12): 10 mg
  Filled 2022-07-04 (×13): qty 1

## 2022-07-04 MED ORDER — PIPERACILLIN-TAZOBACTAM 3.375 G IVPB
3.3750 g | Freq: Three times a day (TID) | INTRAVENOUS | Status: DC
  Administered 2022-07-04 – 2022-07-06 (×8): 3.375 g via INTRAVENOUS
  Filled 2022-07-04 (×8): qty 50

## 2022-07-04 MED ORDER — VANCOMYCIN HCL 1500 MG/300ML IV SOLN
1500.0000 mg | Freq: Once | INTRAVENOUS | Status: AC
  Administered 2022-07-04: 1500 mg via INTRAVENOUS
  Filled 2022-07-04: qty 300

## 2022-07-04 MED ORDER — LACTULOSE 10 GM/15ML PO SOLN
20.0000 g | Freq: Three times a day (TID) | ORAL | Status: DC
  Administered 2022-07-04 – 2022-07-07 (×11): 20 g
  Filled 2022-07-04 (×11): qty 30

## 2022-07-04 MED ORDER — POTASSIUM CHLORIDE 20 MEQ PO PACK
40.0000 meq | PACK | Freq: Once | ORAL | Status: AC
  Administered 2022-07-04: 40 meq
  Filled 2022-07-04: qty 2

## 2022-07-04 MED ORDER — ROCURONIUM BROMIDE 10 MG/ML (PF) SYRINGE
PREFILLED_SYRINGE | INTRAVENOUS | Status: AC
  Administered 2022-07-04: 100 mg
  Filled 2022-07-04: qty 10

## 2022-07-04 MED ORDER — ORAL CARE MOUTH RINSE
15.0000 mL | OROMUCOSAL | Status: DC
  Administered 2022-07-04 – 2022-07-11 (×74): 15 mL via OROMUCOSAL

## 2022-07-04 MED ORDER — ETOMIDATE 2 MG/ML IV SOLN
INTRAVENOUS | Status: AC
  Administered 2022-07-04: 20 mg
  Filled 2022-07-04: qty 10

## 2022-07-04 MED ORDER — ETOMIDATE 2 MG/ML IV SOLN
INTRAVENOUS | Status: AC
  Administered 2022-07-04: 20 mg
  Filled 2022-07-04: qty 20

## 2022-07-04 MED ORDER — APIXABAN 5 MG PO TABS
5.0000 mg | ORAL_TABLET | Freq: Two times a day (BID) | ORAL | Status: DC
  Administered 2022-07-04 – 2022-07-05 (×3): 5 mg
  Filled 2022-07-04 (×3): qty 1

## 2022-07-04 MED ORDER — RIFAXIMIN 550 MG PO TABS
550.0000 mg | ORAL_TABLET | Freq: Two times a day (BID) | ORAL | Status: DC
  Administered 2022-07-04 – 2022-07-12 (×17): 550 mg
  Filled 2022-07-04 (×18): qty 1

## 2022-07-04 NOTE — Progress Notes (Addendum)
Per secure chat with MD regarding PICC, " I think it's ok to place PICC as he is requiring vasopressor support now. " From Jacky Kindle MD.
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
## 2022-07-04 NOTE — Procedures (Signed)
Intubation Procedure Note  Darrell Horne [id number] May 22, 1946   Date:07/04/2022 Time:8:24 AM   Provider Performing:Kyliee Ortego  Procedure: Intubation (31500)  Indication(s) Altered Mental Status  Consent Risks of the procedure as well as the alternatives and risks of each were explained to the patient and/or caregiver.  Consent for the procedure was obtained and is signed in the bedside chart  Anesthesia Etomidate and Rocuronium   Time Out Verified patient identification, verified procedure, site/side was marked, verified correct patient position, special equipment/implants available, medications/allergies/relevant history reviewed, required imaging and test results available.   Sterile Technique Usual hand hygeine, masks, and gloves were used   Procedure Description Patient positioned in bed supine.  Sedation given as noted above.  Patient was intubated with endotracheal tube using Glidescope.  View was Grade 1 full glottis .  Number of attempts was 1.  Colorimetric CO2 detector was consistent with tracheal placement.   Complications/Tolerance None; patient tolerated the procedure well. Chest X-ray is ordered to verify placement.  EBL Minimal   Specimen(s) None

## 2022-07-04 NOTE — Progress Notes (Signed)
Pharmacy Antibiotic Note  Darrell Horne is a 76 y.o. male admitted on 06/16/2022 with pneumonia.  Pharmacy has been consulted for vancomycin dosing. Patient febrile up to 100.5 and WBC increased to 15.3. Patient developed acute hypoxic respiratory failure requiring intubation on 9/25. Chest Xray concerning for infectious process. Pharmacy consulted to dose vancomycin.   Of note, the patient does have a negative MRSA PCR. However, provider would like empiric coverage pending likely bronchoscopy later this afternoon. Patient is also on zosyn which was changed from cefepime on 9/25.    Plan: Give IV Vancomycin 1500 x 1 for loading dose, followed by IV Vancomycin 1500 every 12 hours for eAUC 482. Follow culture data for de-escalation.  Monitor renal function for dose adjustments as indicated.     Height: '5\' 10"'$  (177.8 cm) Weight: 77.5 kg (170 lb 13.7 oz) IBW/kg (Calculated) : 73  Temp (24hrs), Avg:99.2 F (37.3 C), Min:97.2 F (36.2 C), Max:100.5 F (38.1 C)  Recent Labs  Lab 07/01/22 0757 07/02/22 0244 07/03/22 0056 07/03/22 2045 07/03/22 2054 07/04/22 0130  WBC 6.9 10.0 14.9* 16.2*  --  15.3*  CREATININE 0.73 0.76 0.89 1.09  --  1.05  LATICACIDVEN  --   --   --   --  1.7 1.1    Estimated Creatinine Clearance: 61.8 mL/min (by C-G formula based on SCr of 1.05 mg/dL).    No Known Allergies   Thank you for allowing pharmacy to be a part of this patient's care.  Ventura Sellers 07/04/2022 10:16 AM

## 2022-07-04 NOTE — Progress Notes (Signed)
SLP Cancellation Note  Patient Details Name: Darrell Horne MRN: [id number] DOB: 1946/05/05   Cancelled treatment:        Pt NPO for bronchoscopy that was completed one hour ago. He has been more lethargic per notes. Will defer ST tx until tomorrow.    Houston Siren 07/04/2022, 1:28 PM
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
## 2022-07-04 NOTE — Progress Notes (Signed)
Peripherally Inserted Central Catheter Placement  The IV Nurse has discussed with the patient and/or persons authorized to consent for the patient, the purpose of this procedure and the potential benefits and risks involved with this procedure.  The benefits include less needle sticks, lab draws from the catheter, and the patient may be discharged home with the catheter. Risks include, but not limited to, infection, bleeding, blood clot (thrombus formation), and puncture of an artery; nerve damage and irregular heartbeat and possibility to perform a PICC exchange if needed/ordered by physician.  Alternatives to this procedure were also discussed.  Bard Power PICC patient education guide, fact sheet on infection prevention and patient information card has been provided to patient /or left at bedside.    PICC Placement Documentation  PICC Double Lumen 07/04/22 Right Brachial 40 cm 0 cm (Active)  Indication for Insertion or Continuance of Line Vasoactive infusions;Prolonged intravenous therapies 07/04/22 1150  Exposed Catheter (cm) 0 cm 07/04/22 1150  Site Assessment Clean, Dry, Intact 07/04/22 1150  Lumen #1 Status Flushed;Saline locked;Blood return noted 07/04/22 1150  Lumen #2 Status Flushed;Saline locked;Blood return noted 07/04/22 1150  Dressing Type Transparent;Securing device 07/04/22 1150  Dressing Status Antimicrobial disc in place;Clean, Dry, Intact 07/04/22 1150  Dressing Intervention New dressing 07/04/22 1150  Dressing Change Due 07/11/22 07/04/22 [place] 07/04/2022, 11:52 AM

## 2022-07-04 NOTE — Progress Notes (Signed)
NAME:  Darrell Horne, MRN:  [id number], DOB:  [birth year], LOS: 64 ADMISSION DATE:  06/16/2022, CONSULTATION DATE:  06/22/2022 REFERRING MD:  Dr. Louanne Belton, CHIEF COMPLAINT:  Acute Hypoxic Respiratory Failure, Pleural Effusion   History of Present Illness:  Darrell Horne is a 76 year old male with a past medical history of paroxysmal A-fib on Eliquis, left BKA, HTN, HLD, and alcoholic cirrhosis who presented to the hospital with altered mental status and was admitted on 06/16/2022.  He was found to have a large right-sided pleural effusion and his intermittently increased since August 26.  Thoracentesis on 06/17/2022 81 with 2 L of yellow fluid was found to be transudative pleural effusion.  Initially he did well after the procedure but over the past couple days has had increased oxygen requirements and episodes of hypotension.  Hypotension was treated with albumin and midodrine.  PCCM was consulted for further evaluation and management of his right-sided pleural effusion and acute hypoxic respiratory failure.  Noted to have cirrhosis on imaging in 2015.  Right upper quadrant ultrasound this admission with cirrhotic liver but only trace ascites  Pertinent  Medical History    has a past medical history of Chest pain, Diabetes mellitus, DM2 (diabetes mellitus, type 2) ([place]), GERD (gastroesophageal reflux disease), HLD (hyperlipidemia), HTN (hypertension), PAF (paroxysmal atrial fibrillation) ([place]), and Palpitations.   has a past surgical history that includes Total ankle arthroplasty ([id number]); Cholecystectomy; Shoulder surgery ([id number]); Hernia repair; Appendectomy; Leg amputation (Left, [id number]); Colonoscopy; Colonoscopy with propofol (N/A, 11/06/2018); polypectomy (11/06/2018); and IR THORACENTESIS ASP PLEURAL SPACE W/IMG GUIDE (06/17/2022).    Significant Hospital Events: Including procedures, antibiotic start and stop dates in addition to other pertinent events   9/8 thoracentesis with return of 1.2 L of  transudative fluid 06/18/2022: Stage II partial-thickness left medial buttock pressure injury noted low 9/12 increasing oxygen requirements with up to 6 L high flow nasal cannula and episodes of hypotension with systolic of 89.  Albumin, midodrine, and tube feeds started.  Core track placed. 9/13 PCCM consulted, Eliquis held for planned thoracentesis tomorrow. CXR with stable pulm edema and pleural effusions 9/14 increased O2 requirements, stable on BiPAP now. Bilateral chest tubes placed with return of 6 L of yellow fluid. Off BiPAP 9/15 doing well off BiPAP overnight, on 5L [place], slowed drainage from bilat chest tubes, drastically improved peripheral edema.  9/16 off bipap. Remains off pressors. Stable to transfer out of ICU  9/19 - Lactulose held due to diarrhea . More responsive today. Bilateral chest tubes clamped since 9/18.  Transudative pleural effusion deemed due to cirrhosis and cardiac impairment.  Removal of chest tubes.  Oxygen level 96% on room air.  With diuresis he is -18 L.  Delirium thought to be slowly improving ammonia level 20.  Lactulose is being continued.  For A-fib oral amiodarone and oral metoprolol continued with Eliquis.  Being maintained on midodrine as well.   Critical care medicine signed off 06/29/2022: Continue diuresis.  Improved alertness noticed. 07/01/2022: Worsening confusion but also fluctuating confusion.  Continue diuresis. 07/02/2022: Lethargic worsening confusion.  Afebrile per hospitalist 07/03/2022: PCCM Consulted for confusion and respiratory distress, transferred to ICU  Interim History / Subjective:  Overnight, HFNC weaned to 8L/min. Tachycardic with tachypnea and some low BP in 80s/60s. Febrile to 100.5 F at 7pm.  9/24 -new onset fever since 07/03/2019 300.8.  Worsening hypoxemia with rapid escalation from 4 L yesterday according to hospitalist to 10 L facemask today.  Chest x-ray with bilateral acute lung injury and  small pleural effusions started on  antibiotics and pulmonary called.  Encephalopathy worse in the last few days.  Critical care medicine reconsulted.  Objective   Blood pressure 104/67, pulse 85, temperature 98 F (36.7 C), temperature source Axillary, resp. rate (!) 21, height '5\' 10"'$  (1.778 m), weight 77.5 kg, SpO2 100 %.    FiO2 (%):  [100 %] 100 %   Intake/Output Summary (Last 24 hours) at 07/04/2022 0735 Last data filed at 07/04/2022 0700 Gross per 24 hour  Intake 3166.02 ml  Output 900 ml  Net 2266.02 ml    Filed Weights   07/02/22 0351 07/03/22 0342 07/04/22 0500  Weight: 77.7 kg 76.2 kg 77.5 kg  Labs: Mg 2.5 CBC: WBC 15.3 (down from 16.2), Hgb 8.8 (down from 9.6) Phos 3.4 PT 23.1 and INR 2.1 Lactic acid 1.1 Procal 1.12 Ammonia 49 Glucose 110  Imaging: CT head wnl.   Examination: General: Chronically and critically ill appearing male HEENT: Dry MM, poor dentition CV: HR 70s, atrial fibrillation Resp: Crackles in bilateral lung fields Abdomen: Soft, distended Ext: Warm, no edema Neuro: Unarousable to voice or painful stimuli  Resolved Hospital Problem list     Assessment & Plan:  Bilateral pleural effusion, transudate -status post bilateral chest tubes  Sepsis with multiorgan failure Suspect HAP Febrile with leukocytosis and multiorgan failure requiring vasopressor support. Pulm exam w/ crackles. CXR w/ improved aeration today compared to yesterday's CXR. D/c Cefepime given AMS. - Start IV Zosyn and Vancomycin - Blood culture - Tracheal aspirate if he gets intubated - Urine analysis   Portal Hypertension Cirrhosis, ascites, pleural effusion, volume overload Cirrhosis first documented 2015 on CT imaging. Ultrasound liver 9/17> cirrhotic morphology, s/p cholecystectomy, trace ascites, incidental right pleural effusion 07/03/2022 -MELD score 12 points based on INR from August 2023.Marland Kitchen  Normal ammonia 06/29/2022 Ammonia elevated to 49 this morning. Worsening mentation. - Start Rifaximin, per  tube - Continue lactulose, per tube - Furosemide for hepatic hydrothorax and pleural effusions.  Acute metabolic encephalopathy - hypoactive ICU delirium - Present on Admit. Acutely worsened today, unarousable to voice or painful stimuli. ntubated and sedated this morning with PRVC -Precedex infusion -Continue to hold home gabapentin -F/u ABG  Paroxysmal Afib, rate controlled -on home amiodarone and metoprolol RV dysfunction 0- mild Mild to Moderate Mitral regug CHA2DS2-VASc score of 4.   -Continue home Eliquis -Remains rate controlled  Hx HTN  -at home on metoprolol History of hyperlipidemia on statin -Continue midodrine -MAP goal > 65    Chronic anemia - CBC stable   GERD - PPI  Malnutrition -severe protein calorie Debility Deconditioning  - TF per [place], diet as mental status allows -but hold on 07/03/2022 due to worsening encephalopathy and transferred to ICU   Pressure injury stage II left medial buttock - Present on Admit -per wound care  Best Practice (right click and "Reselect all SmartList Selections" daily)   Diet/type: tubefeeds per cortrak - hold 07/03/22 DVT prophylaxis: DOAC - HOLD due to encephalopathy 07/03/22 a GI prophylaxis: PPI Lines: N/A - [picc oprdered 89/2423 Foley:  N/A Code Status:  full code Last date of multidisciplinary goals of care discussion [9/14] - wife updated over phone 07/03/22  [place]  Lab 06/29/22 1643 07/02/22 1826 07/03/22 0850 07/03/22 2311 07/03/22 2320  PHART  --   --  7.44 7.815* 7.424  PCO2ART  --   --  36 <15.0* 34.8  PO2ART  --   --  166* 17* 92  HCO3 36.3* 27.9 24.2 22.2 22.7  TCO2  --   --   --  23 24  O2SAT 20.1 50.4 99.1 97 97     CBC Recent Labs  Lab 07/03/22 0056 07/03/22 2045 07/03/22 2311 07/03/22 2320 07/04/22 0130  HGB 10.0* 9.6* 8.5* 17.0 8.8*  HCT 29.3* 29.0* 25.0* 50.0 26.2*  WBC 14.9* 16.2*  --   --  15.3*  PLT 165 89*  --   --  171     COAGULATION Recent  Labs  Lab 07/03/22 2054 07/04/22 0130  INR 2.1* 2.1*    CARDIAC  No results for input(s): "TROPONINI" in the last 168 hours. No results for input(s): "PROBNP" in the last 168 hours.   CHEMISTRY Recent Labs  Lab 07/01/22 0757 07/02/22 0244 07/03/22 0056 07/03/22 2045 07/03/22 2311 07/03/22 2320 07/04/22 0130  NA 135 133* 137 136 139 139 138  K 4.2 4.1 4.3 4.3 3.9 3.9 3.9  CL 104 105 105 106  --   --  106  CO2 '26 22 23 '$ 20*  --   --  23  GLUCOSE 149* 145* 165* 205*  --   --  133*  BUN 22 27* 40* 53*  --   --  56*  CREATININE 0.73 0.76 0.89 1.09  --   --  1.05  CALCIUM 8.8* 9.1 9.2 9.0  --   --  9.3  MG 2.2 2.2 2.3 2.4  --   --  2.5*  PHOS 2.7 2.6 3.1 3.6  --   --  3.4    Estimated Creatinine Clearance: 61.8 mL/min (by C-G formula based on SCr of 1.05 mg/dL).   LIVER Recent Labs  Lab 07/01/22 0757 07/02/22 0244 07/03/22 0056 07/03/22 2045 07/03/22 2054 07/04/22 0130  AST 99* 77* 85* 97*  --  107*  ALT 83* 68* 78* 79*  --  81*  ALKPHOS 207* 196* 200* 213*  --  201*  BILITOT 0.9 1.6* 2.0* 1.9*  --  1.8*  PROT 5.8* 6.1* 6.3* 7.1  --  5.9*  ALBUMIN 2.8* 3.3* 3.1* 2.7*  --  2.6*  INR  --   --   --   --  2.1* 2.1*      INFECTIOUS Recent Labs  Lab 07/03/22 2054 07/04/22 0130  LATICACIDVEN 1.7 1.1  PROCALCITON  --  1.13     ENDOCRINE CBG (last 3)  Recent Labs    07/03/22 2149 07/03/22 2311 07/04/22 0337  GLUCAP 182* 151* 110*          IMAGING x48h  - image(s) personally visualized  -   highlighted in bold CT HEAD WO CONTRAST (5MM)  Result Date: 07/04/2022 CLINICAL DATA:  76 year old male with altered mental status. EXAM: CT HEAD WITHOUT CONTRAST TECHNIQUE: Contiguous axial images were obtained from the base of the skull through the vertex without intravenous contrast. RADIATION DOSE REDUCTION: This exam was performed according to the departmental dose-optimization program which includes automated exposure control, adjustment of the mA and/or  kV according to patient size and/or use of iterative reconstruction technique. COMPARISON:  Brain MRI 07/02/2022.  Head CT 06/16/2022. FINDINGS: Brain: Stable cerebral volume. No midline shift, ventriculomegaly, mass effect, evidence of mass lesion, intracranial hemorrhage or evidence of cortically based acute infarction. Stable and largely normal for age gray-white differentiation throughout the brain. No cortical encephalomalacia identified. Vascular: Calcified atherosclerosis at the skull base. No suspicious intracranial vascular hyperdensity. Skull: No acute osseous abnormality identified. Sinuses/Orbits: Stable opacification of the right sphenoid  sinus. Other Visualized paranasal sinuses and mastoids are stable and well aerated. Other: New left side nasoenteric tube from the earlier CT. No acute orbit or scalp soft tissue finding. IMPRESSION: No acute intracranial abnormality, stable noncontrast CT appearance of the brain. Electronically Signed   By: Genevie Ann M.D.   On: 07/04/2022 04:03   [place] EKG SITE RITE  Result Date: 07/03/2022 If Site Rite image not attached, placement could not be confirmed due to current cardiac rhythm.  DG CHEST PORT 1 VIEW  Result Date: 07/03/2022 CLINICAL DATA:  Altered mental status and fever EXAM: PORTABLE CHEST 1 VIEW COMPARISON:  Radiographs 07/02/2022 FINDINGS: Subdiaphragmatic enteric tube. Increased hazy opacities in both lungs since 07/02/2022. Probable small bilateral pleural effusions. Stable cardiomediastinal silhouette. No acute osseous abnormality. IMPRESSION: Increasing diffuse airspace opacities bilaterally. Differential considerations include infection or edema. Probable small bilateral pleural effusions. Electronically Signed   By: Placido Sou M.D.   On: 07/03/2022 19:41   MR BRAIN WO CONTRAST  Result Date: 07/02/2022 CLINICAL DATA:  Delirium. EXAM: MRI HEAD WITHOUT CONTRAST TECHNIQUE: Multiplanar, multiecho pulse sequences of the brain and surrounding  structures were obtained without intravenous contrast. COMPARISON:  CT head without contrast 06/16/2022. MR head without contrast 08/05/2015 FINDINGS: Brain: No acute infarct, hemorrhage, or mass lesion is present. Moderate generalized atrophy has progressed since 2016. Mild white matter changes are stable and likely within normal limits for age. The ventricles are proportionate to the degree of atrophy. No significant extraaxial fluid collection is present. Dilated perivascular spaces are present within the basal ganglia. The internal auditory canals are within normal limits. The brainstem and cerebellum are within normal limits. Vascular: Flow is present in the major intracranial arteries. Skull and upper cervical spine: Degenerative changes are present at C2-3. Craniocervical junction is normal. Marrow signal is normal. The patient is edentulous. Sinuses/Orbits: The paranasal sinuses and mastoid air cells are clear. Bilateral lens replacements are noted. Globes and orbits are otherwise unremarkable. IMPRESSION: 1. No acute intracranial abnormality. 2. Progressive moderate generalized atrophy. This likely reflects the sequela of chronic microvascular ischemia. Electronically Signed   By: San Morelle M.D.   On: 07/02/2022 17:40   DG CHEST PORT 1 VIEW  Result Date: 07/02/2022 CLINICAL DATA:  10026 altered mental status EXAM: PORTABLE CHEST 1 VIEW COMPARISON:  September 20-22, 2023 FINDINGS: Evaluation is limited by patient rotation. Cardiomediastinal silhouette is grossly unchanged. The enteric tube courses through the chest to the abdomen beyond the field-of-view. There is diffuse hazy airspace opacities bilaterally with an upper lung predominance, overall increased since September 20th. There is increased confluent opacity of bilateral apices. A definitive pneumothorax is not identified. Likely small bilateral pleural effusions. IMPRESSION: Increasing upper lobe predominant opacities bilaterally with  increased apical confluence. Differential considerations include infection and atypical pulmonary edema. Electronically Signed   By: Valentino Saxon M.D.   On: 07/02/2022 12:07    Orvis Brill, DO PGY-2 [place]

## 2022-07-04 NOTE — Procedures (Signed)
Bronchoscopy Procedure Note  TORREZ RENFROE  [id number]  02-20-1946  Date:07/04/22  Time:12:37 PM   Provider Performing:Milayna Rotenberg   Procedure(s):  Flexible bronchoscopy with bronchial alveolar lavage (72094)  Indication(s) Aspiration pneumonia  Consent Risks of the procedure as well as the alternatives and risks of each were explained to the patient and/or caregiver.  Consent for the procedure was obtained and is signed in the bedside chart  Anesthesia Etomidate and rocuronium   Time Out Verified patient identification, verified procedure, site/side was marked, verified correct patient position, special equipment/implants available, medications/allergies/relevant history reviewed, required imaging and test results available.   Sterile Technique Usual hand hygiene, masks, gowns, and gloves were used   Procedure Description Bronchoscope advanced through endotracheal tube and into airway.  Airways were examined down to subsegmental level with findings noted below.   Following diagnostic evaluation, BAL(s) performed in right lower lobe/left lower lobe with normal saline and return of blood-tinged fluid  Findings: Lumpy bumpy mucosa noted all over, there was small amount of blood in upper airway.  BAL was performed in bilateral lower lobes   Complications/Tolerance None; patient tolerated the procedure well. Chest X-ray is needed post procedure.   EBL Minimal   Specimen(s) BAL

## 2022-07-04 NOTE — Progress Notes (Addendum)
eLink Physician-Brief Progress Note Patient Name: Darrell Horne DOB: 1945-10-23 MRN: [id number]   Date of Service  07/04/2022  HPI/Events of Note  76 year old male with hx of Afib on Eliquis, home Metoprolol, alcoholic liver cirrhosis who presented with acute respiratory failure in the setting of bilateral pleural effusion, now course was complicated with acute hepatic encephalopathy and aspiration pneumonia-> intubated.   E-link called for Afib, rates up to 140's.  Was on Amiodarone earlier, stopped due to prolonged QT.    Intubated today and got bronch/BAL   eICU Interventions  - continue to hold Amiodarone  - was on home Metoprolol, problematic due to his hypotension  -currentlyl down to 117 spontaneously  - accept rates 120-130, if persistently higher, sustained, can try 75m IV push Metoprolol  -replete Magnesium         Radames Mejorado N Jamin Panther 07/04/2022, 8:57 PM

## 2022-07-04 NOTE — TOC Progression Note (Signed)
Transition of Care [facility]) - Initial/Assessment Note    Patient Details  Name: Darrell Horne MRN: [id number] Date of Birth: [birth year]  [facility]) CM/SW Contact:    Milinda Antis, [place] Phone Number: 07/04/2022, 11:00 AM  Clinical Narrative:                 Patient is currently on the ventilator.    TOC will continue to follow.  Expected Discharge Plan: Skilled Nursing Facility Barriers to Discharge: Continued Medical Work up, Ship broker   Patient Goals and CMS Choice Patient states their goals for this hospitalization and ongoing recovery are:: get back to rehab CMS Medicare.gov Compare Post Acute Care list provided to:: Patient Choice offered to / list presented to : Patient, Spouse  Expected Discharge Plan and Services Expected Discharge Plan: [place] Acute Care Choice: [place] Living arrangements for the past 2 months: Apartment                                      Prior Living Arrangements/Services Living arrangements for the past 2 months: Apartment Lives with:: Spouse Patient language and need for interpreter reviewed:: No Do you feel safe going back to the place where you live?: Yes      Need for Family Participation in Patient Care: Yes (Comment) Care giver support system in place?: No (comment)   Criminal Activity/Legal Involvement Pertinent to Current Situation/Hospitalization: No - Comment as needed  Activities of Daily Living Home Assistive Devices/Equipment: Wheelchair, Prosthesis ADL Screening (condition at time of admission) Patient's cognitive ability adequate to safely complete daily activities?: No Is the patient deaf or have difficulty hearing?: Yes Does the patient have difficulty seeing, even when wearing glasses/contacts?: No Does the patient have difficulty concentrating, remembering, or making decisions?: Yes Patient able to express need for assistance with ADLs?:  Yes Does the patient have difficulty dressing or bathing?: Yes Independently performs ADLs?: No Does the patient have difficulty walking or climbing stairs?: Yes Weakness of Legs: None Weakness of Arms/Hands: None  Permission Sought/Granted Permission sought to share information with : Facility Sport and exercise psychologist, Family Supports Permission granted to share information with : Yes, Verbal Permission Granted  Share Information with NAME: [name]  Permission granted to share info w AGENCY: Art gallery manager granted to share info w Relationship: Spouse     Emotional Assessment   Attitude/Demeanor/Rapport: Engaged Affect (typically observed): Appropriate Orientation: : Oriented to Self, Oriented to Place, Oriented to  Time Alcohol / Substance Use: Not Applicable Psych Involvement: No (comment)  Admission diagnosis:  Pleural effusion [J90] Encephalopathy [G93.40] Acute encephalopathy [G93.40] Patient Active Problem List   Diagnosis Date Noted   Bilateral pleural effusion    Acute respiratory failure with hypoxia (HCC)    Anemia 06/21/2022   Protein-calorie malnutrition, severe 06/21/2022   Pressure injury of skin 06/18/2022   Pleural effusion on right 06/17/2022   Acute encephalopathy 06/16/2022   Overdose by ingestion 06/03/2022   Overdose 06/03/2022   Exudative age-related macular degeneration of left eye with active choroidal neovascularization ([place]) 12/16/2020   Diabetes mellitus without complication ([place]) 02/40/9735   Early stage nonexudative age-related macular degeneration of right eye 12/16/2020   Former smoker 12/14/2020   Positive hepatitis C antibody test 10/08/2019   Personal history of colonic polyps 11/06/2018   DDD (degenerative disc disease), lumbar 08/17/2018  DDD (degenerative disc disease), cervical 08/17/2018   Hx of BKA, left ([place]) 11/11/2016   High risk medication use 11/11/2016   Syncope 04/17/2016   Diabetic polyneuropathy associated with  type 2 diabetes mellitus ([place]) 99/83/3825   Alcoholic peripheral neuropathy ([place]) 12/23/2015   Sensory ataxia 12/23/2015   Headache 11/04/2015   Chronic low back pain 11/04/2015   Neuropathy due to medical condition ([place]) 04/08/2015   Diabetes ([place]) 04/09/2014   PAF (paroxysmal atrial fibrillation) ([place])    DM2 (diabetes mellitus, type 2) ([place])    HTN (hypertension)    Palpitations    HLD (hyperlipidemia)    PCP:  Sandrea Hughs, NP Pharmacy:   [id number] [place], [id number]: [phone]: [address] [address]Phone: [phone]: [phone]    Social Determinants of Health (SDOH) Interventions    Readmission Risk Interventions     No data to display

## 2022-07-05 ENCOUNTER — Inpatient Hospital Stay (HOSPITAL_COMMUNITY): Payer: Medicare Other

## 2022-07-05 DIAGNOSIS — J90 Pleural effusion, not elsewhere classified: Secondary | ICD-10-CM | POA: Diagnosis not present

## 2022-07-05 DIAGNOSIS — J95811 Postprocedural pneumothorax: Secondary | ICD-10-CM | POA: Diagnosis not present

## 2022-07-05 DIAGNOSIS — J9601 Acute respiratory failure with hypoxia: Secondary | ICD-10-CM | POA: Diagnosis not present

## 2022-07-05 DIAGNOSIS — G9340 Encephalopathy, unspecified: Secondary | ICD-10-CM | POA: Diagnosis not present

## 2022-07-05 LAB — COMPREHENSIVE METABOLIC PANEL
ALT: 118 U/L — ABNORMAL HIGH (ref 0–44)
AST: 156 U/L — ABNORMAL HIGH (ref 15–41)
Albumin: 2.2 g/dL — ABNORMAL LOW (ref 3.5–5.0)
Alkaline Phosphatase: 255 U/L — ABNORMAL HIGH (ref 38–126)
Anion gap: 3 — ABNORMAL LOW (ref 5–15)
BUN: 61 mg/dL — ABNORMAL HIGH (ref 8–23)
CO2: 23 mmol/L (ref 22–32)
Calcium: 8.4 mg/dL — ABNORMAL LOW (ref 8.9–10.3)
Chloride: 112 mmol/L — ABNORMAL HIGH (ref 98–111)
Creatinine, Ser: 1.05 mg/dL (ref 0.61–1.24)
GFR, Estimated: >60 mL/min (ref >60)
Glucose, Bld: 210 mg/dL — ABNORMAL HIGH (ref 70–99)
Potassium: 3 mmol/L — ABNORMAL LOW (ref 3.5–5.1)
Sodium: 138 mmol/L (ref 135–145)
Total Bilirubin: 2.5 mg/dL — ABNORMAL HIGH (ref 0.3–1.2)
Total Protein: 5.9 g/dL — ABNORMAL LOW (ref 6.5–8.1)

## 2022-07-05 LAB — CBC
HCT: 26.7 % — ABNORMAL LOW (ref 39.0–52.0)
HCT: 28.8 % — ABNORMAL LOW (ref 39.0–52.0)
Hemoglobin: 9.1 g/dL — ABNORMAL LOW (ref 13.0–17.0)
Hemoglobin: 9.5 g/dL — ABNORMAL LOW (ref 13.0–17.0)
MCH: 33.2 pg (ref 26.0–34.0)
MCH: 34.3 pg — ABNORMAL HIGH (ref 26.0–34.0)
MCHC: 33 g/dL (ref 30.0–36.0)
MCHC: 34.1 g/dL (ref 30.0–36.0)
MCV: 100.7 fL — ABNORMAL HIGH (ref 80.0–100.0)
MCV: 100.8 fL — ABNORMAL HIGH (ref 80.0–100.0)
Platelets: 147 10*3/uL — ABNORMAL LOW (ref 150–400)
Platelets: 157 10*3/uL (ref 150–400)
RBC: 2.65 MIL/uL — ABNORMAL LOW (ref 4.22–5.81)
RBC: 2.86 MIL/uL — ABNORMAL LOW (ref 4.22–5.81)
RDW: 15.1 % (ref 11.5–15.5)
RDW: 15.1 % (ref 11.5–15.5)
WBC: 15.4 10*3/uL — ABNORMAL HIGH (ref 4.0–10.5)
WBC: 16.4 10*3/uL — ABNORMAL HIGH (ref 4.0–10.5)
nRBC: 0 % (ref 0.0–0.2)
nRBC: 0 % (ref 0.0–0.2)

## 2022-07-05 LAB — GLUCOSE, CAPILLARY
Glucose-Capillary: 162 mg/dL — ABNORMAL HIGH (ref 70–99)
Glucose-Capillary: 193 mg/dL — ABNORMAL HIGH (ref 70–99)
Glucose-Capillary: 161 mg/dL — ABNORMAL HIGH (ref 70–99)
Glucose-Capillary: 171 mg/dL — ABNORMAL HIGH (ref 70–99)
Glucose-Capillary: 160 mg/dL — ABNORMAL HIGH (ref 70–99)
Glucose-Capillary: 188 mg/dL — ABNORMAL HIGH (ref 70–99)
Glucose-Capillary: 197 mg/dL — ABNORMAL HIGH (ref 70–99)

## 2022-07-05 LAB — PROCALCITONIN: Procalcitonin: 1.57 ng/mL

## 2022-07-05 LAB — MAGNESIUM: Magnesium: 2.4 mg/dL (ref 1.7–2.4)

## 2022-07-05 MED ORDER — NOREPINEPHRINE 4 MG/250ML-% IV SOLN
2.0000 ug/min | INTRAVENOUS | Status: DC
  Administered 2022-07-05: 8 ug/min via INTRAVENOUS
  Administered 2022-07-06 (×2): 2 ug/min via INTRAVENOUS
  Filled 2022-07-05 (×2): qty 250

## 2022-07-05 MED ORDER — POTASSIUM CHLORIDE 20 MEQ PO PACK
40.0000 meq | PACK | Freq: Once | ORAL | Status: AC
  Administered 2022-07-05: 40 meq
  Filled 2022-07-05: qty 2

## 2022-07-05 MED ORDER — INSULIN ASPART 100 UNIT/ML IJ SOLN
3.0000 [IU] | INTRAMUSCULAR | Status: DC
  Administered 2022-07-05 – 2022-07-06 (×5): 3 [IU] via SUBCUTANEOUS

## 2022-07-05 MED ORDER — POTASSIUM CHLORIDE 20 MEQ PO PACK
20.0000 meq | PACK | ORAL | Status: AC
  Administered 2022-07-05 (×2): 20 meq
  Filled 2022-07-05 (×2): qty 1

## 2022-07-05 MED ORDER — METOPROLOL TARTRATE 12.5 MG HALF TABLET
12.5000 mg | ORAL_TABLET | Freq: Three times a day (TID) | ORAL | Status: DC
  Administered 2022-07-05 – 2022-07-06 (×4): 12.5 mg
  Filled 2022-07-05 (×4): qty 1

## 2022-07-05 MED ORDER — MIDODRINE HCL 5 MG PO TABS
10.0000 mg | ORAL_TABLET | Freq: Three times a day (TID) | ORAL | Status: DC
  Administered 2022-07-05 – 2022-07-10 (×15): 10 mg
  Filled 2022-07-05 (×15): qty 2

## 2022-07-05 MED ORDER — POTASSIUM CHLORIDE 10 MEQ/50ML IV SOLN
10.0000 meq | INTRAVENOUS | Status: AC
  Administered 2022-07-05 (×4): 10 meq via INTRAVENOUS
  Filled 2022-07-05 (×4): qty 50

## 2022-07-05 MED ORDER — NOREPINEPHRINE 4 MG/250ML-% IV SOLN
INTRAVENOUS | Status: AC
  Administered 2022-07-05: 5 mg
  Filled 2022-07-05: qty 250

## 2022-07-05 MED ORDER — DEXTROSE 10 % IV SOLN: INTRAVENOUS | Status: DC | PRN

## 2022-07-05 NOTE — Progress Notes (Signed)
[facility] ADULT ICU REPLACEMENT PROTOCOL   The patient does apply for the [facility] Adult ICU Electrolyte Replacment Protocol based on the criteria listed below:   1.Exclusion criteria: TCTS patients, ECMO patients, and Dialysis patients 2. Is GFR >/= 30 ml/min? Yes.    Patient's GFR today is >60 3. Is SCr </= 2? Yes.   Patient's SCr is 1.05 mg/dL 4. Did SCr increase >/= 0.5 in 24 hours? No. 5.Pt's weight >40kg  Yes.   6. Abnormal electrolyte(s):   K 3.0  7. Electrolytes replaced per protocol 8. Call MD STAT for K+ </= 2.5, Phos </= 1, or Mag </= 1 Physician:  Avon Gully R Talisha Erby 07/05/2022 6:26 AM

## 2022-07-05 NOTE — Procedures (Signed)
Thoracentesis  Procedure Note  Darrell Horne  [id number]  December 28, 1945  Date:07/05/22  Time:3:12 PM   Provider Performing:Thorvald Orsino   Procedure: Thoracentesis with imaging guidance (61224)  Indication(s) Pleural Effusion  Consent Risks of the procedure as well as the alternatives and risks of each were explained to the patient and/or caregiver.  Consent for the procedure was obtained and is signed in the bedside chart  Anesthesia Topical only with 1% lidocaine    Time Out Verified patient identification, verified procedure, site/side was marked, verified correct patient position, special equipment/implants available, medications/allergies/relevant history reviewed, required imaging and test results available.   Sterile Technique Maximal sterile technique including full sterile barrier drape, hand hygiene, sterile gown, sterile gloves, mask, hair covering, sterile ultrasound probe cover (if used).  Procedure Description Ultrasound was used to identify appropriate pleural anatomy for placement and overlying skin marked.  Area of drainage cleaned and draped in sterile fashion. Lidocaine was used to anesthetize the skin and subcutaneous tissue.  1500 cc's of yellowish appearing fluid was drained from the left pleural space. Catheter then removed and bandaid applied to site.   Complications/Tolerance None; patient tolerated the procedure well. Chest X-ray is ordered to confirm no post-procedural complication.   EBL Minimal   Specimen(s) None

## 2022-07-05 NOTE — Progress Notes (Signed)
PT Cancellation Note  Patient Details Name: Darrell Horne MRN: [id number] DOB: 1946-02-27   Cancelled Treatment:    Reason Eval/Treat Not Completed: Medical issues which prohibited therapy (Per nurse, HOLD pt today as he has incr HR and agitation.  Has been intubated since last seen by PT. Will check back at later date.)   Alvira Philips 07/05/2022, 7:07 AM Antoney Biven M,PT [place]

## 2022-07-05 NOTE — Progress Notes (Signed)
Pharmacy ICU Insulin Management  CBGs above goal 140-180 mg/dL: Yes  Current regimen (include units of SSI in last 24hr): SSI 6u/24h on sensitive   Medications affecting CBG levels: tube feeds being titrated up to goal   Plan: Will add low dose tube feed coverage of 3 units lispro Q4H.  A1C is 4.8%, has not required insulin throughout hospitalization.  Anticipate that hyperglycemia is likely stress and tube feed related.

## 2022-07-05 NOTE — Progress Notes (Signed)
SLP Cancellation Note  Patient Details Name: Darrell Horne MRN: [id number] DOB: Mar 06, 1946   Cancelled treatment:       Reason Eval/Treat Not Completed: Medical issues which prohibited therapy  Unable to assess diet tolerance at this time, as pt is currently requiring vent support. Per RN, pt has 2 pleural effusions, and was intubated yesterday. Will continue efforts.   [name] B. Quentin Ore, [facility], [place] Speech Language Pathologist Office: [phone]  [name] 07/05/2022, 9:01 AM

## 2022-07-05 NOTE — Procedures (Signed)
Thoracentesis  Procedure Note  RICKI CLACK  [id number]  12/29/1945  Date:07/05/22  Time:2:05 PM   Provider Performing:Kenroy Timberman   Procedure: Thoracentesis with imaging guidance (77034)  Indication(s) Pleural Effusion  Consent Risks of the procedure as well as the alternatives and risks of each were explained to the patient and/or caregiver.  Consent for the procedure was obtained and is signed in the bedside chart  Anesthesia Topical only with 1% lidocaine    Time Out Verified patient identification, verified procedure, site/side was marked, verified correct patient position, special equipment/implants available, medications/allergies/relevant history reviewed, required imaging and test results available.   Sterile Technique Maximal sterile technique including full sterile barrier drape, hand hygiene, sterile gown, sterile gloves, mask, hair covering, sterile ultrasound probe cover (if used).  Procedure Description Ultrasound was used to identify appropriate pleural anatomy for placement and overlying skin marked.  Area of drainage cleaned and draped in sterile fashion. Lidocaine was used to anesthetize the skin and subcutaneous tissue.  1200 cc's of Serosanguish appearing fluid was drained from the right pleural space. Catheter then removed and bandaid applied to site.   Complications/Tolerance None; patient tolerated the procedure well. Chest X-ray is ordered to confirm no post-procedural complication.   EBL Minimal   Specimen(s) None

## 2022-07-05 NOTE — Progress Notes (Signed)
Patient had a bilateral thoracentesis, after left-sided thoracentesis gush of air was noted when catheter was pulled back. X-ray chest was done which showed moderate to large left-sided pneumothorax. Patient wife was informed about new finding, consent was obtained for pigtail chest tube placement.     Jacky Kindle, MD [facility] See Amion for pager If no response to pager, please call [phone] until 7pm After 7pm, Please call E-link [phone]

## 2022-07-05 NOTE — Progress Notes (Signed)
NAME:  Darrell Horne, MRN:  [id number], DOB:  Oct 16, 1945, LOS: 79 ADMISSION DATE:  06/16/2022, CONSULTATION DATE:  06/22/2022 REFERRING MD:  Dr. Louanne Belton, CHIEF COMPLAINT:  Acute Hypoxic Respiratory Failure, Pleural Effusion   History of Present Illness:  Darrell Horne is a 76 year old male with a past medical history of paroxysmal A-fib on Eliquis, left BKA, HTN, HLD, and alcoholic cirrhosis who presented to the hospital with altered mental status and was admitted on 06/16/2022.  He was found to have a large right-sided pleural effusion and his intermittently increased since August 26.  Thoracentesis on 06/17/2022 81 with 2 L of yellow fluid was found to be transudative pleural effusion.  Initially he did well after the procedure but over the past couple days has had increased oxygen requirements and episodes of hypotension.  Hypotension was treated with albumin and midodrine.  PCCM was consulted for further evaluation and management of his right-sided pleural effusion and acute hypoxic respiratory failure.  Noted to have cirrhosis on imaging in 2015.  Right upper quadrant ultrasound this admission with cirrhotic liver but only trace ascites  Pertinent  Medical History    has a past medical history of Chest pain, Diabetes mellitus, DM2 (diabetes mellitus, type 2) ([place]), GERD (gastroesophageal reflux disease), HLD (hyperlipidemia), HTN (hypertension), PAF (paroxysmal atrial fibrillation) ([place]), and Palpitations.   has a past surgical history that includes Total ankle arthroplasty ([id number]); Cholecystectomy; Shoulder surgery ([id number]); Hernia repair; Appendectomy; Leg amputation (Left, [id number]); Colonoscopy; Colonoscopy with propofol (N/A, 11/06/2018); polypectomy (11/06/2018); and IR THORACENTESIS ASP PLEURAL SPACE W/IMG GUIDE (06/17/2022).    Significant Hospital Events: Including procedures, antibiotic start and stop dates in addition to other pertinent events   9/8 thoracentesis with return of 1.2 L of  transudative fluid 06/18/2022: Stage II partial-thickness left medial buttock pressure injury noted low 9/12 increasing oxygen requirements with up to 6 L high flow nasal cannula and episodes of hypotension with systolic of 89.  Albumin, midodrine, and tube feeds started.  Core track placed. 9/13 PCCM consulted, Eliquis held for planned thoracentesis tomorrow. CXR with stable pulm edema and pleural effusions 9/14 increased O2 requirements, stable on BiPAP now. Bilateral chest tubes placed with return of 6 L of yellow fluid. Off BiPAP 9/15 doing well off BiPAP overnight, on 5L [place], slowed drainage from bilat chest tubes, drastically improved peripheral edema.  9/16 off bipap. Remains off pressors. Stable to transfer out of ICU  9/19 - Lactulose held due to diarrhea . More responsive today. Bilateral chest tubes clamped since 9/18.  Transudative pleural effusion deemed due to cirrhosis and cardiac impairment.  Removal of chest tubes.  Oxygen level 96% on room air.  With diuresis he is -18 L.  Delirium thought to be slowly improving ammonia level 20.  Lactulose is being continued.  For A-fib oral amiodarone and oral metoprolol continued with Eliquis.  Being maintained on midodrine as well.   Critical care medicine signed off 06/29/2022: Continue diuresis.  Improved alertness noticed. 07/01/2022: Worsening confusion but also fluctuating confusion.  Continue diuresis. 07/02/2022: Lethargic worsening confusion.  Afebrile per hospitalist 07/03/2022: PCCM Consulted for confusion and respiratory distress, transferred to ICU  Interim History / Subjective:  Patient was intubated yesterday due to change in mental status Remained afebrile Respiratory culture showed no growth  Objective   Blood pressure (!) 87/54, pulse (!) 123, temperature 99 F (37.2 C), temperature source Axillary, resp. rate (!) 21, height '5\' 10"'$  (1.778 m), weight 77.5 kg, SpO2 100 %.    Vent  Mode: PRVC FiO2 (%):  [40 %] 40 % Set Rate:  [16  bmp] 16 bmp Vt Set:  [580 mL] 580 mL PEEP:  [5 cmH20] 5 cmH20 Plateau Pressure:  [18 cmH20-26 cmH20] 18 cmH20   Intake/Output Summary (Last 24 hours) at 07/05/2022 1036 Last data filed at 07/05/2022 0900 Gross per 24 hour  Intake 2335.35 ml  Output 1100 ml  Net 1235.35 ml   Filed Weights   07/03/22 0342 07/04/22 0500 07/05/22 0500  Weight: 76.2 kg 77.5 kg 77.5 kg     Physical exam: General: Crtitically ill-appearing male, orally intubated HEENT: [place]/AT, eyes anicteric.  ETT and OGT in place Neuro: Opens eyes with vocal stimuli, not following commands, moving all 4 extremities Chest: Reduced air entry at the bases bilaterally, no wheezes or rhonchi Heart: Regular rate and rhythm, no murmurs or gallops Abdomen: Soft, nontender, nondistended, bowel sounds present Skin: No rash  Resolved Hospital Problem list     Assessment & Plan:  Acute hypoxic respiratory failure  Sepsis with septic shock due to aspiration pneumonia  Bilateral hydrothorax in the setting of liver cirrhosis  Acute septic/metabolic encephalopathy due to liver cirrhosis Continue lung protective ventilation Not ready for spontaneous breathing trial Bedside ultrasound showed large bilateral pleural effusion We will perform thoracentesis Shock has resolved Patient is off vasopressors Continue IV antibiotics Avoid deep sedation with RASS goal 0/-1 Vent protocol in place with Precedex   Portal Hypertension Decompensated alcoholic liver cirrhosis, ascites, pleural effusion, volume overload Coagulopathy Continue lactulose and rifaximin Patient is on Lasix infusion Continue spironolactone  Paroxysmal Afib, with RVR Prolonged Qtc Heart rate remain in 130s Amiodarone was stopped yesterday in the setting of prolonged QTc Started on metoprolol low-dose 3 times daily On Eliquis for stroke prophylaxis, CHA2DS2-VASc score of 4   Anemia of chronic disease Monitor H&H   GERD PPI  Malnutrition -severe protein  calorie Continue tube feeds  Pressure injury stage II left medial buttock - Present on Admit Continue wound care  Hypokalemia Continue aggressive electrolyte supplement  Best Practice (right click and "Reselect all SmartList Selections" daily)   Diet/type: Tube feeds DVT prophylaxis: DOAC  GI prophylaxis: PPI Lines: Yes needed Foley:  N/A Code Status:  full code Last date of multidisciplinary goals of care discussion [9/14] - wife updated over phone 07/05/22  LABS    PULMONARY Recent Labs  Lab 07/02/22 1826 07/03/22 0850 07/03/22 2311 07/03/22 2320 07/04/22 1021  PHART  --  7.44 7.815* 7.424 7.385  PCO2ART  --  36 <15.0* 34.8 41.4  PO2ART  --  166* 17* 92 448*  HCO3 27.9 24.2 22.2 22.7 25.0  TCO2  --   --  '23 24 26  '$ O2SAT 50.4 99.1 97 97 100    CBC Recent Labs  Lab 07/04/22 0130 07/04/22 1021 07/05/22 0007 07/05/22 0500  HGB 8.8* 9.2* 9.5* 9.1*  HCT 26.2* 27.0* 28.8* 26.7*  WBC 15.3*  --  16.4* 15.4*  PLT 171  --  147* 157    COAGULATION Recent Labs  Lab 07/03/22 2054 07/04/22 0130  INR 2.1* 2.1*    CARDIAC  No results for input(s): "TROPONINI" in the last 168 hours. No results for input(s): "PROBNP" in the last 168 hours.   CHEMISTRY Recent Labs  Lab 07/01/22 0757 07/02/22 0244 07/03/22 0056 07/03/22 2045 07/03/22 2311 07/03/22 2320 07/04/22 0130 07/04/22 1021 07/05/22 0500  NA 135 133* 137 136 139 139 138 140 138  K 4.2 4.1 4.3 4.3 3.9 3.9 3.9 3.7  3.0*  CL 104 105 105 106  --   --  106  --  112*  CO2 '26 22 23 '$ 20*  --   --  23  --  23  GLUCOSE 149* 145* 165* 205*  --   --  133*  --  210*  BUN 22 27* 40* 53*  --   --  56*  --  61*  CREATININE 0.73 0.76 0.89 1.09  --   --  1.05  --  1.05  CALCIUM 8.8* 9.1 9.2 9.0  --   --  9.3  --  8.4*  MG 2.2 2.2 2.3 2.4  --   --  2.5*  --  2.4  PHOS 2.7 2.6 3.1 3.6  --   --  3.4  --   --    Estimated Creatinine Clearance: 61.8 mL/min (by C-G formula based on SCr of 1.05 mg/dL).   LIVER Recent  Labs  Lab 07/02/22 0244 07/03/22 0056 07/03/22 2045 07/03/22 2054 07/04/22 0130 07/05/22 0500  AST 77* 85* 97*  --  107* 156*  ALT 68* 78* 79*  --  81* 118*  ALKPHOS 196* 200* 213*  --  201* 255*  BILITOT 1.6* 2.0* 1.9*  --  1.8* 2.5*  PROT 6.1* 6.3* 7.1  --  5.9* 5.9*  ALBUMIN 3.3* 3.1* 2.7*  --  2.6* 2.2*  INR  --   --   --  2.1* 2.1*  --      INFECTIOUS Recent Labs  Lab 07/03/22 2054 07/04/22 0130 07/05/22 0500  LATICACIDVEN 1.7 1.1  --   PROCALCITON  --  1.13 1.57     ENDOCRINE CBG (last 3)  Recent Labs    07/05/22 0313 07/05/22 0443 07/05/22 0736  GLUCAP 162* 193* 161*    Total critical care time: 41 minutes  Performed by: Jacky Kindle   Critical care time was exclusive of separately billable procedures and treating other patients.   Critical care was necessary to treat or prevent imminent or life-threatening deterioration.   Critical care was time spent personally by me on the following activities: development of treatment plan with patient and/or surrogate as well as nursing, discussions with consultants, evaluation of patient's response to treatment, examination of patient, obtaining history from patient or surrogate, ordering and performing treatments and interventions, ordering and review of laboratory studies, ordering and review of radiographic studies, pulse oximetry and re-evaluation of patient's condition.   Jacky Kindle, MD [facility] See Amion for pager If no response to pager, please call [phone] until 7pm After 7pm, Please call E-link [phone]

## 2022-07-05 NOTE — Progress Notes (Addendum)
eLink Physician-Brief Progress Note Patient Name: Darrell Horne DOB: December 14, 1945 MRN: [id number]   Date of Service  07/05/2022  HPI/Events of Note  76 year old male with hx of Afib on Eliquis, home Metoprolol, alcoholic liver cirrhosis who presented with acute respiratory failure in the setting of bilateral pleural effusion, now course was complicated with acute hepatic encephalopathy and aspiration pneumonia-> intubated and got bronch/BAL today   E-link called for Afib, rates up to 140's.  Was on Amiodarone earlier, stopped due to prolonged QT.   Additionally has had issues with some blood clots on suctioning in back of throat. No blood in NGT or ETT. Apparently had moderately difficult intubation earlier today     eICU Interventions  - continue to hold Amiodarone   -currentlyl down to 117 spontaneously  - accept rates 120-130, resume Precedex infusion for now, this will help with rate   - was on home Metoprolol, problematic due to his hypotension  -if persistently higher, sustained, can try 47m IV push Metoprolol  -replete Magnesium  -check CBC - transfuse only for Hb < 7-8         Darrell Horne 07/05/2022, 12:00 AM

## 2022-07-05 NOTE — Procedures (Signed)
Insertion of Chest Tube Procedure Note  Darrell Horne  [id number]  05-03-1946  Date:07/05/22  Time:6:46 PM    Provider Performing: Jacky Kindle   Procedure: Pleural Catheter Insertion w/ Imaging Guidance [phone])  Indication(s) Pneumothorax  Consent Risks of the procedure as well as the alternatives and risks of each were explained to the patient and/or caregiver.  Consent for the procedure was obtained and is signed in the bedside chart  Anesthesia Topical only with 1% lidocaine    Time Out Verified patient identification, verified procedure, site/side was marked, verified correct patient position, special equipment/implants available, medications/allergies/relevant history reviewed, required imaging and test results available.   Sterile Technique Maximal sterile technique including full sterile barrier drape, hand hygiene, sterile gown, sterile gloves, mask, hair covering, sterile ultrasound probe cover (if used).   Procedure Description Ultrasound used to identify appropriate pleural anatomy for placement and overlying skin marked. Area of placement cleaned and draped in sterile fashion.  A 14 French pigtail pleural catheter was placed into the left pleural space using Seldinger technique. Appropriate return of air was obtained.  The tube was connected to atrium and placed on -20 cm H2O wall suction.   Complications/Tolerance None; patient tolerated the procedure well. Chest X-ray is ordered to verify placement.   EBL Minimal  Specimen(s) none

## 2022-07-06 ENCOUNTER — Other Ambulatory Visit: Payer: Self-pay

## 2022-07-06 DIAGNOSIS — Z66 Do not resuscitate: Secondary | ICD-10-CM

## 2022-07-06 DIAGNOSIS — R627 Adult failure to thrive: Secondary | ICD-10-CM | POA: Diagnosis not present

## 2022-07-06 DIAGNOSIS — J9601 Acute respiratory failure with hypoxia: Secondary | ICD-10-CM | POA: Diagnosis not present

## 2022-07-06 DIAGNOSIS — Z515 Encounter for palliative care: Secondary | ICD-10-CM | POA: Diagnosis not present

## 2022-07-06 DIAGNOSIS — J90 Pleural effusion, not elsewhere classified: Secondary | ICD-10-CM | POA: Diagnosis not present

## 2022-07-06 DIAGNOSIS — Z7189 Other specified counseling: Secondary | ICD-10-CM | POA: Diagnosis not present

## 2022-07-06 DIAGNOSIS — G9340 Encephalopathy, unspecified: Secondary | ICD-10-CM | POA: Diagnosis not present

## 2022-07-06 LAB — HEPATIC FUNCTION PANEL
ALT: 165 U/L — ABNORMAL HIGH (ref 0–44)
AST: 208 U/L — ABNORMAL HIGH (ref 15–41)
Albumin: 1.9 g/dL — ABNORMAL LOW (ref 3.5–5.0)
Alkaline Phosphatase: 255 U/L — ABNORMAL HIGH (ref 38–126)
Bilirubin, Direct: 0.7 mg/dL — ABNORMAL HIGH (ref 0.0–0.2)
Indirect Bilirubin: 1.2 mg/dL — ABNORMAL HIGH (ref 0.3–0.9)
Total Bilirubin: 1.9 mg/dL — ABNORMAL HIGH (ref 0.3–1.2)
Total Protein: 5.6 g/dL — ABNORMAL LOW (ref 6.5–8.1)

## 2022-07-06 LAB — BASIC METABOLIC PANEL
Anion gap: 9 (ref 5–15)
BUN: 55 mg/dL — ABNORMAL HIGH (ref 8–23)
CO2: 22 mmol/L (ref 22–32)
Calcium: 8.7 mg/dL — ABNORMAL LOW (ref 8.9–10.3)
Chloride: 116 mmol/L — ABNORMAL HIGH (ref 98–111)
Creatinine, Ser: 1.09 mg/dL (ref 0.61–1.24)
GFR, Estimated: >60 mL/min (ref >60)
Glucose, Bld: 237 mg/dL — ABNORMAL HIGH (ref 70–99)
Potassium: 3.3 mmol/L — ABNORMAL LOW (ref 3.5–5.1)
Sodium: 147 mmol/L — ABNORMAL HIGH (ref 135–145)

## 2022-07-06 LAB — MAGNESIUM: Magnesium: 2.3 mg/dL (ref 1.7–2.4)

## 2022-07-06 LAB — HEPARIN LEVEL (UNFRACTIONATED)
Heparin Unfractionated: >1.1 IU/mL — ABNORMAL HIGH (ref 0.30–0.70)
Heparin Unfractionated: >1.1 IU/mL — ABNORMAL HIGH (ref 0.30–0.70)

## 2022-07-06 LAB — GLUCOSE, CAPILLARY
Glucose-Capillary: 167 mg/dL — ABNORMAL HIGH (ref 70–99)
Glucose-Capillary: 168 mg/dL — ABNORMAL HIGH (ref 70–99)
Glucose-Capillary: 187 mg/dL — ABNORMAL HIGH (ref 70–99)
Glucose-Capillary: 190 mg/dL — ABNORMAL HIGH (ref 70–99)
Glucose-Capillary: 196 mg/dL — ABNORMAL HIGH (ref 70–99)
Glucose-Capillary: 198 mg/dL — ABNORMAL HIGH (ref 70–99)

## 2022-07-06 LAB — APTT
aPTT: 38 seconds — ABNORMAL HIGH (ref 24–36)
aPTT: 73 seconds — ABNORMAL HIGH (ref 24–36)

## 2022-07-06 MED ORDER — VANCOMYCIN HCL 1500 MG/300ML IV SOLN
1500.0000 mg | Freq: Once | INTRAVENOUS | Status: AC
  Administered 2022-07-06: 1500 mg via INTRAVENOUS
  Filled 2022-07-06: qty 300

## 2022-07-06 MED ORDER — CHLORHEXIDINE GLUCONATE CLOTH 2 % EX PADS
6.0000 | MEDICATED_PAD | Freq: Every day | CUTANEOUS | Status: DC
  Administered 2022-07-06 – 2022-07-07 (×2): 6 via TOPICAL

## 2022-07-06 MED ORDER — VANCOMYCIN HCL 1500 MG/300ML IV SOLN
1500.0000 mg | INTRAVENOUS | Status: DC
  Filled 2022-07-06: qty 300

## 2022-07-06 MED ORDER — INSULIN ASPART 100 UNIT/ML IJ SOLN
4.0000 [IU] | INTRAMUSCULAR | Status: DC
  Administered 2022-07-06 – 2022-07-08 (×11): 4 [IU] via SUBCUTANEOUS

## 2022-07-06 MED ORDER — HEPARIN (PORCINE) 25000 UT/250ML-% IV SOLN
1300.0000 [IU]/h | INTRAVENOUS | Status: DC
  Administered 2022-07-06 – 2022-07-09 (×4): 1100 [IU]/h via INTRAVENOUS
  Filled 2022-07-06 (×4): qty 250

## 2022-07-06 MED ORDER — POTASSIUM CHLORIDE 20 MEQ PO PACK
20.0000 meq | PACK | ORAL | Status: AC
  Administered 2022-07-06 (×2): 20 meq
  Filled 2022-07-06 (×2): qty 1

## 2022-07-06 MED ORDER — FREE WATER
100.0000 mL | Freq: Three times a day (TID) | Status: DC
  Administered 2022-07-06 – 2022-07-08 (×6): 100 mL

## 2022-07-06 MED ORDER — ACETAMINOPHEN 325 MG PO TABS
650.0000 mg | ORAL_TABLET | Freq: Four times a day (QID) | ORAL | Status: DC | PRN
  Administered 2022-07-06: 650 mg via ORAL
  Filled 2022-07-06: qty 2

## 2022-07-06 MED ORDER — SODIUM CHLORIDE 0.9 % IV SOLN
2.0000 g | Freq: Three times a day (TID) | INTRAVENOUS | Status: AC
  Administered 2022-07-06 – 2022-07-13 (×20): 2 g via INTRAVENOUS
  Filled 2022-07-06 (×24): qty 40

## 2022-07-06 MED ORDER — POTASSIUM CHLORIDE 10 MEQ/50ML IV SOLN
10.0000 meq | INTRAVENOUS | Status: AC
  Administered 2022-07-06 (×4): 10 meq via INTRAVENOUS
  Filled 2022-07-06 (×4): qty 50

## 2022-07-06 MED ORDER — ACETAMINOPHEN 325 MG PO TABS
650.0000 mg | ORAL_TABLET | Freq: Four times a day (QID) | ORAL | Status: DC | PRN
  Administered 2022-07-06 – 2022-07-15 (×4): 650 mg
  Filled 2022-07-06 (×4): qty 2

## 2022-07-06 NOTE — Progress Notes (Signed)
PT Cancellation Note  Patient Details Name: Darrell Horne MRN: [id number] DOB: 29-Dec-1945   Cancelled Treatment:    Reason Eval/Treat Not Completed: Medical issues which prohibited therapy.  Re-intubated, RN will check on the plan and advise later today if pt can be seen. 07/06/2022  Ginger Carne., PT Acute Rehabilitation Services [phone]  (office)   Tessie Fass Darrell Horne 07/06/2022, 10:22 AM

## 2022-07-06 NOTE — Progress Notes (Signed)
Inpatient Diabetes Program Recommendations  AACE/ADA: New Consensus Statement on Inpatient Glycemic Control (2015)  Target Ranges:  Prepandial:   less than 140 mg/dL      Peak postprandial:   less than 180 mg/dL (1-2 hours)      Critically ill patients:  140 - 180 mg/dL   Lab Results  Component Value Date   GLUCAP 190 (H) 07/06/2022   HGBA1C 4.8 04/19/2022    Review of Glycemic Control  Latest Reference Range & Units 07/05/22 23:13 07/06/22 03:18 07/06/22 07:52  Glucose-Capillary 70 - 99 mg/dL 197 (H) 196 (H) 190 (H)   Diabetes history: None Current orders for Inpatient glycemic control:  Novolog 0-9 units q 4 hours Novolog 3 units q 4 hours Osmolite 55 ml/hr  Inpatient Diabetes Program Recommendations:    Consider increasing Novolog tube feed coverage to 4 units q 4 hours.   Thanks,  Adah Perl, RN, BC-ADM Inpatient Diabetes Coordinator Pager [phone]  (8a-5p)

## 2022-07-06 NOTE — Progress Notes (Signed)
SLP Cancellation Note  Patient Details Name: LA DIBELLA MRN: [id number] DOB: July 26, 1946   Cancelled treatment:        Attempted to see pt for ongoing dysphagia management.  Pt still with ETT at this time.  Pt will need reassessment once extubated.  SLP to follow for medical readiness for repeat swallow evaluation.   Celedonio Savage, MA, [place] Office: [phone] 07/06/2022, 10:00 AM

## 2022-07-06 NOTE — Progress Notes (Signed)
[facility] ADULT ICU REPLACEMENT PROTOCOL   The patient does apply for the [facility] Adult ICU Electrolyte Replacment Protocol based on the criteria listed below:   1.Exclusion criteria: TCTS patients, ECMO patients, and Dialysis patients 2. Is GFR >/= 30 ml/min? Yes.    Patient's GFR today is >60 3. Is SCr </= 2? Yes.   Patient's SCr is 1.09 mg/dL 4. Did SCr increase >/= 0.5 in 24 hours? No. 5.Pt's weight >40kg  Yes.   6. Abnormal electrolyte(s): K+ 3.3  7. Electrolytes replaced per protocol 8.  Call MD STAT for K+ </= 2.5, Phos </= 1, or Mag </= 1 Physician:  R. Nallamothu  Darrell Horne 07/06/2022 5:56 AM

## 2022-07-06 NOTE — Progress Notes (Signed)
ANTICOAGULATION CONSULT NOTE - Initial Consult  Pharmacy Consult for heparin Indication: atrial fibrillation  No Known Allergies  Patient Measurements: Height: '5\' 10"'$  (177.8 cm) Weight: 77 kg (169 lb 12.1 oz) IBW/kg (Calculated) : 73 Heparin Dosing Weight: 77 kg  Vital Signs: Temp: 100.9 F (38.3 C) (09/27 0734) Temp Source: Axillary (09/27 0734) BP: 107/64 (09/27 0734) Pulse Rate: 118 (09/27 0734)  Labs: Recent Labs    07/03/22 2054 07/03/22 2311 07/04/22 0130 07/04/22 1021 07/05/22 0007 07/05/22 0500 07/06/22 0332  HGB  --    < > 8.8* 9.2* 9.5* 9.1*  --   HCT  --    < > 26.2* 27.0* 28.8* 26.7*  --   PLT  --   --  171  --  147* 157  --   LABPROT 23.3*  --  23.1*  --   --   --   --   INR 2.1*  --  2.1*  --   --   --   --   CREATININE  --   --  1.05  --   --  1.05 1.09   < > = values in this interval not displayed.    Estimated Creatinine Clearance: 59.5 mL/min (by C-G formula based on SCr of 1.09 mg/dL).   Medical History: Past Medical History:  Diagnosis Date   Chest pain    Diabetes mellitus    DM2 (diabetes mellitus, type 2) ([facility])    GERD (gastroesophageal reflux disease)    HLD (hyperlipidemia)    HTN (hypertension)    PAF (paroxysmal atrial fibrillation) ([facility])    Palpitations     Medications:  Scheduled:   bethanechol  10 mg Per Tube TID   Chlorhexidine Gluconate Cloth  6 each Topical Q2000   feeding supplement (PROSource TF20)  60 mL Per Tube Daily   folic acid  1 mg Per Tube Daily   free water  100 mL Per Tube Q8H   insulin aspart  0-9 Units Subcutaneous Q4H   insulin aspart  3 Units Subcutaneous Q4H   lactulose  20 g Per Tube TID   midodrine  10 mg Per Tube Q8H   multivitamin with minerals  1 tablet Per Tube Daily   mupirocin cream   Topical Daily   mouth rinse  15 mL Mouth Rinse Q2H   pantoprazole  40 mg Per Tube Daily   polyethylene glycol  17 g Per Tube Daily   rifaximin  550 mg Per Tube BID   spironolactone  100 mg Per Tube Daily    [START ON 07/07/2022] thiamine  100 mg Oral Daily    Assessment: 76 y/o male admitted for altered mental status and bilateral pleural effusions. PMH of atrial fibrillation on eliquis PTA (last dose morning of 9/26). Apixaban held in the setting of increasing LFTs. Pharmacy was consulted to dose heparin.   Most recent hgb 9.6, plt 157. Given timing of last apixaban dose, will not give bolus and start infusion.  Goal of Therapy:  Heparin level 0.3-0.7 units/ml aPTT 66-102 seconds Monitor platelets by anticoagulation protocol: Yes   Plan:  Check baseline heparin level and aPTT Start heparin gtt at 1100 units/hour Re-check 6-hour heparin and aPTT levels Monitor daily CBC and s/sx bleeding  Louanne Belton, PharmD, [facility] PGY1 Pharmacy Resident 07/06/2022 11:15 AM

## 2022-07-06 NOTE — Progress Notes (Signed)
Palliative:  76 year old male with a past medical history of paroxysmal A-fib on Eliquis, left BKA, HTN, HLD, and alcoholic cirrhosis who presented to the hospital with altered mental status and was admitted on 06/16/2022. He was found to have a large right-sided pleural effusion and his intermittently increased since August 26.  Thoracentesis on 06/17/2022 with 2 L of yellow fluid was found to be transudative pleural effusion. Post procedure developed increased oxygen requirements and hypotension. 06/22/2022 PCCM consulted for evaluation and management of his right-sided pleural effusion and acute hypoxic respiratory failure.    I met today at Darrell Horne's bedside with Dr. Lamonte Sakai and Inocente Salles, RN. Darrell Horne is critically ill. He does not open eyes or follow any commands. He only moves head around with agitation - NOT to nod head to answer of questions. Unfortunately he has had complicated and prolonged hospitalization. He is frail and overall prognosis is poor.   I called and spoke with wife/HCPOA, Darrell Horne. Darrell Horne has had communication with the medical team and nurses and she is appreciative of the updates. We discussed his progress and she shares that there have only been 3 days when Darrell Horne recognized family and was able to eat a little bit. I discussed with Darrell Horne my concern for his decline, failure to thrive, decompensated cirrhosis. I discussed with Darrell Horne that his body is failing and there is no clear intervention or treatment that will be able to help him improve to a good quality of life. I spoke with Darrell Horne about preparing for difficult decisions as I fear his life is limited and time is short. I spoke with her about quality of life and what he want for himself. Darrell Horne was appropriately tearful. We discussed code status and she agrees he would not want to be resuscitated - DNR decided. She is not prepared for comfort care at this time but we did discuss that this may be appropriate to consider for the near future. She  struggles with knowing when the right time will be. I reassured her that Darrell Horne body and the medical team will help [place] know and we will support her throughout this journey.   All questions/concerns addressed. Emotional support provided.   Exam: Unresponsive, agitated at times. Vent on pressure support - tolerating. Abd distended. Off vasopressors.   Plan: - DNR decided - Ongoing goals of care conversations - may consider comfort measures in the near future  50 min  Darrell Sill, NP Palliative Medicine Team Pager [phone] (Please see [email or web address] for schedule) Team Phone [phone]    Greater than 50%  of this time was spent counseling and coordinating care related to the above assessment and plan

## 2022-07-06 NOTE — Progress Notes (Signed)
OT Cancellation Note  Patient Details Name: Darrell Horne MRN: [id number] DOB: January 07, 1946   Cancelled Treatment:    Reason Eval/Treat Not Completed: Medical issues which prohibited therapy (Pt reintubated. Will follow.)  [name] 07/06/2022, 1:42 PM Cleta Alberts, OTR/L Acute Rehabilitation Services Office: [phone]

## 2022-07-06 NOTE — Progress Notes (Addendum)
NAME:  Darrell Horne, MRN:  [id number], DOB:  March 13, 1946, LOS: 47 ADMISSION DATE:  06/16/2022, CONSULTATION DATE:  06/22/2022 REFERRING MD:  Dr. Louanne Belton, CHIEF COMPLAINT:  Acute Hypoxic Respiratory Failure, Pleural Effusion   History of Present Illness:  Darrell Horne is a 76 year old male with a past medical history of paroxysmal A-fib on Eliquis, left BKA, HTN, HLD, and alcoholic cirrhosis who presented to the hospital with altered mental status and was admitted on 06/16/2022.  He was found to have a large right-sided pleural effusion and his intermittently increased since August 26.  Thoracentesis on 06/17/2022 81 with 2 L of yellow fluid was found to be transudative pleural effusion.  Initially he did well after the procedure but over the past couple days has had increased oxygen requirements and episodes of hypotension.  Hypotension was treated with albumin and midodrine.  PCCM was consulted for further evaluation and management of his right-sided pleural effusion and acute hypoxic respiratory failure.  Noted to have cirrhosis on imaging in 2015.  Right upper quadrant ultrasound this admission with cirrhotic liver but only trace ascites  Pertinent  Medical History    has a past medical history of Chest pain, Diabetes mellitus, DM2 (diabetes mellitus, type 2) ([place]), GERD (gastroesophageal reflux disease), HLD (hyperlipidemia), HTN (hypertension), PAF (paroxysmal atrial fibrillation) ([place]), and Palpitations.   has a past surgical history that includes Total ankle arthroplasty (59563875); Cholecystectomy; Shoulder surgery ([id number]); Hernia repair; Appendectomy; Leg amputation (Left, [id number]); Colonoscopy; Colonoscopy with propofol (N/A, 11/06/2018); polypectomy (11/06/2018); and IR THORACENTESIS ASP PLEURAL SPACE W/IMG GUIDE (06/17/2022).    Significant Hospital Events: Including procedures, antibiotic start and stop dates in addition to other pertinent events   9/8 thoracentesis with return of 1.2 L of  transudative fluid 06/18/2022: Stage II partial-thickness left medial buttock pressure injury noted low 9/12 increasing oxygen requirements with up to 6 L high flow nasal cannula and episodes of hypotension with systolic of 89.  Albumin, midodrine, and tube feeds started.  Core track placed. 9/13 PCCM consulted, Eliquis held for planned thoracentesis tomorrow. CXR with stable pulm edema and pleural effusions 9/14 increased O2 requirements, stable on BiPAP now. Bilateral chest tubes placed with return of 6 L of yellow fluid. Off BiPAP 9/15 doing well off BiPAP overnight, on 5L [place], slowed drainage from bilat chest tubes, drastically improved peripheral edema.  9/16 off bipap. Remains off pressors. Stable to transfer out of ICU  9/19 - Lactulose held due to diarrhea . More responsive today. Bilateral chest tubes clamped since 9/18.  Transudative pleural effusion deemed due to cirrhosis and cardiac impairment.  Removal of chest tubes.  Oxygen level 96% on room air.  With diuresis he is -18 L.  Delirium thought to be slowly improving ammonia level 20.  Lactulose is being continued.  For A-fib oral amiodarone and oral metoprolol continued with Eliquis.  Being maintained on midodrine as well.   Critical care medicine signed off 06/29/2022: Continue diuresis.  Improved alertness noticed. 07/01/2022: Worsening confusion but also fluctuating confusion.  Continue diuresis. 07/02/2022: Lethargic worsening confusion.  Afebrile per hospitalist 07/03/2022: PCCM Consulted for confusion and respiratory distress, transferred to ICU 9/25: Intubated  9/26: Bilateral thoracentesis w/ 1200 cc's serosnanguinous fluid drained from right pleural space and 1500 ccs of yellowish fluid from left pleural space. Had large left-sided pneumothorax afterward with pigtail chest tube placed  Interim History / Subjective:  Febrile overnight to 101.5 F, most recently at 0300.  Sedated on PS this morning. Vancomycin d/c'ed 9/26.  Objective    Blood pressure 107/64, pulse (!) 118, temperature (!) 100.9 F (38.3 C), temperature source Axillary, resp. rate (!) 23, height '5\' 10"'$  (1.778 m), weight 77 kg, SpO2 100 %.    Vent Mode: CPAP;PSV FiO2 (%):  [30 %-40 %] 30 % Set Rate:  [16 bmp] 16 bmp Vt Set:  [580 mL] 580 mL PEEP:  [5 cmH20] 5 cmH20 Pressure Support:  [10 cmH20] 10 cmH20 Plateau Pressure:  [18 cmH20-24 cmH20] 20 cmH20   Intake/Output Summary (Last 24 hours) at 07/06/2022 0825 Last data filed at 07/06/2022 0800 Gross per 24 hour  Intake 2889.08 ml  Output 5685 ml  Net -2795.92 ml   Filed Weights   07/04/22 0500 07/05/22 0500 07/06/22 0500  Weight: 77.5 kg 77.5 kg 77 kg    Labs: Blood cultures negative at 3 days Mg 2.3 BMP- Na 138-147 (corrected: 149)- hypernatremic Cr 1.09, BUN 55, GFR >60 K+ 3.3 Procal 1.57   Physical exam: General: Crtitically ill-appearing male, orally intubated HEENT: [place]/AT, eyes anicteric.  ETT and OGT in place Neuro: Does not open eyes with vocal stimuli, not following commands, moving all 4 extremities Chest: Reduced air entry at the bases bilaterally, no wheezes or rhonchi Heart: Regular rate and rhythm, no murmurs or gallops Abdomen: Soft, nontender, nondistended, bowel sounds present Skin: No rash  Resolved Hospital Problem list     Assessment & Plan:  Acute hypoxic respiratory failure  Sepsis with septic shock due to aspiration pneumonia  Bilateral hydrothorax in the setting of liver cirrhosis ; s/p thoracentesis Acute septic/metabolic encephalopathy due to liver cirrhosis Lung sounds clear bilaterally. Would like MRSA coverage given febrile overnight.  Still requiring Levophed at 28mg/min. Neo d/c'ed yesterday 9/26. - Continue Zosyn (start: 9/25) - Start Vancomycin - Continue Midodrine and Levo - Continue lung protective ventilation - Avoid deep sedation; RASS goal 0/-1 - wean precedex - fentanyl q2h PRN for pain  Portal Hypertension Decompensated alcoholic liver  cirrhosis, ascites, pleural effusion, volume overload Coagulopathy Attempting to balance BP management w/ prevention of hepatorenal syndrome. - Wean Lasix infusion to 5 mg - Continue lactulose and rifaximin for HE - Continue spironolactone  Paroxysmal Afib, with RVR Prolonged Qtc HR in 115s-130s overnight. - D/c metoprolol  - Hold Eliquis for CVA ppx given increase LFTs; CHADSVASC score 4 - Start heparin for ppx   Anemia of chronic disease Monitor H&H   GERD PPI  Malnutrition -severe protein calorie Hyperglycemia, likely stress/tube feed related Hypokalemia CBGs above goal, 180s-190s - Continue tube feeds - 3 units lispro Q4h; goal CBG 140-180  Pressure injury stage II left medial buttock - Present on Admit Continue wound care  Hypokalemia Continue aggressive electrolyte supplement  Best Practice (right click and "Reselect all SmartList Selections" daily)   Diet/type: Tube feeds DVT prophylaxis: DOAC  GI prophylaxis: PPI Lines: Yes needed Foley:  N/A Code Status:  full code Last date of multidisciplinary goals of care discussion [9/14] - wife updated over phone 07/05/22  [facility] [facility] 07/02/22 1826 07/03/22 0850 07/03/22 2311 07/03/22 2320 07/04/22 1021  PHART  --  7.44 7.815* 7.424 7.385  PCO2ART  --  36 <15.0* 34.8 41.4  PO2ART  --  166* 17* 92 448*  HCO3 27.9 24.2 22.2 22.7 25.0  TCO2  --   --  '23 24 26  '$ O2SAT 50.4 99.1 97 97 100    CBC Recent Labs  Lab 07/04/22 0130 07/04/22 1021 07/05/22 0007 07/05/22 0500  HGB 8.8* 9.2* 9.5* 9.1*  HCT 26.2* 27.0* 28.8* 26.7*  WBC 15.3*  --  16.4* 15.4*  PLT 171  --  147* 157    COAGULATION Recent Labs  Lab 07/03/22 2054 07/04/22 0130  INR 2.1* 2.1*    CARDIAC  No results for input(s): "TROPONINI" in the last 168 hours. No results for input(s): "PROBNP" in the last 168 hours.   CHEMISTRY Recent Labs  Lab 07/01/22 0757 07/02/22 0244 07/03/22 0056 07/03/22 2045  07/03/22 2311 07/03/22 2320 07/04/22 0130 07/04/22 1021 07/05/22 0500 07/06/22 0332  NA 135 133* 137 136   < > 139 138 140 138 147*  K 4.2 4.1 4.3 4.3   < > 3.9 3.9 3.7 3.0* 3.3*  CL 104 105 105 106  --   --  106  --  112* 116*  CO2 '26 22 23 '$ 20*  --   --  23  --  23 22  GLUCOSE 149* 145* 165* 205*  --   --  133*  --  210* 237*  BUN 22 27* 40* 53*  --   --  56*  --  61* 55*  CREATININE 0.73 0.76 0.89 1.09  --   --  1.05  --  1.05 1.09  CALCIUM 8.8* 9.1 9.2 9.0  --   --  9.3  --  8.4* 8.7*  MG 2.2 2.2 2.3 2.4  --   --  2.5*  --  2.4 2.3  PHOS 2.7 2.6 3.1 3.6  --   --  3.4  --   --   --    < > = values in this interval not displayed.   Estimated Creatinine Clearance: 59.5 mL/min (by C-G formula based on SCr of 1.09 mg/dL).   LIVER Recent Labs  Lab 07/02/22 0244 07/03/22 0056 07/03/22 2045 07/03/22 2054 07/04/22 0130 07/05/22 0500  AST 77* 85* 97*  --  107* 156*  ALT 68* 78* 79*  --  81* 118*  ALKPHOS 196* 200* 213*  --  201* 255*  BILITOT 1.6* 2.0* 1.9*  --  1.8* 2.5*  PROT 6.1* 6.3* 7.1  --  5.9* 5.9*  ALBUMIN 3.3* 3.1* 2.7*  --  2.6* 2.2*  INR  --   --   --  2.1* 2.1*  --      INFECTIOUS Recent Labs  Lab 07/03/22 2054 07/04/22 0130 07/05/22 0500  LATICACIDVEN 1.7 1.1  --   PROCALCITON  --  1.13 1.57     ENDOCRINE CBG (last 3)  Recent Labs    07/05/22 2313 07/06/22 0318 07/06/22 0752  GLUCAP 197* 196* [place], DO PGY-2 [place]

## 2022-07-06 NOTE — Progress Notes (Signed)
Pharmacy ICU Insulin Management  CBGs above goal 140-180 mg/dL: Yes  Current regimen (include units of SSI in last 24hr): SSI 10u/24h on sensitive   Medications affecting CBG levels: tube feeds being titrated up to goal   Plan: Increase tube feed coverage to 4 units lispro Q4H.  Anticipate that hyperglycemia is likely stress and tube feed related.   Darrell Horne, PharmD, [facility] PGY1 Pharmacy Resident 07/06/2022 1:52 PM

## 2022-07-06 NOTE — Progress Notes (Addendum)
Pharmacy Antibiotic Note  Darrell Horne is a 76 y.o. male admitted on 06/16/2022 with pneumonia. Patient developed acute hypoxic respiratory failure requiring intubation on 9/25 and was switched to Zosyn due to concern for aspiration pneumonia. Pharmacy consulted to dose vancomycin.   Of note, the patient does have a negative MRSA PCR, and vancomycin was stopped on 9/26 after respiratory cultures showed no preliminary growth. However, patient developed a fever over night, and respiratory cultures are now growing klebsiella pneumoniae (ESBL), enterococcus faecalis, and staph aureus. Team will switch Zosyn to meropenem for MDR coverage and restart vancomycin until e faecalis and staph aureus have susceptibilities.  Plan: Restart vancomycin '1500mg'$  IV Q24 hrs (eAUC 503, Scr 1.09) Start meropenem 2g IV q8hrs Follow respiratory culture data for de-escalation.  Monitor clinical status and renal function for dose adjustments  Height: '5\' 10"'$  (177.8 cm) Weight: 77 kg (169 lb 12.1 oz) IBW/kg (Calculated) : 73  Temp (24hrs), Avg:100.6 F (38.1 C), Min:98.6 F (37 C), Max:101.5 F (38.6 C)  Recent Labs  Lab 07/03/22 0056 07/03/22 2045 07/03/22 2054 07/04/22 0130 07/05/22 0007 07/05/22 0500 07/06/22 0332  WBC 14.9* 16.2*  --  15.3* 16.4* 15.4*  --   CREATININE 0.89 1.09  --  1.05  --  1.05 1.09  LATICACIDVEN  --   --  1.7 1.1  --   --   --     Estimated Creatinine Clearance: 59.5 mL/min (by C-G formula based on SCr of 1.09 mg/dL).    No Known Allergies  Antimicrobials this admission: Vanc 9/24 >> 9/26, 9/27 >> Cefepime 9/24 >> 9/26 Zosyn 9/26 >> 9/27 Meropenem 9/27 >>  Dose adjustments this admission: N/A  Microbiology results: 9/24 BCx: NGTD 9/25 Sputum: kleb pneumo (ESBL), e faecalis, staph aureus (susceptibilities pending)  9/24 MRSA PCR: negative  Thank you for allowing pharmacy to be a part of this patient's care.  Louanne Belton, PharmD, [facility] PGY1 Pharmacy  Resident 07/06/2022 1:07 PM

## 2022-07-06 NOTE — Progress Notes (Signed)
ANTICOAGULATION CONSULT NOTE - follow-up  Pharmacy Consult for heparin Indication: atrial fibrillation  No Known Allergies  Patient Measurements: Height: '5\' 10"'$  (177.8 cm) Weight: 77 kg (169 lb 12.1 oz) IBW/kg (Calculated) : 73 Heparin Dosing Weight: 77 kg  Vital Signs: Temp: 100.4 F (38 C) (09/27 1940) Temp Source: Axillary (09/27 1940) BP: 93/53 (09/27 2000) Pulse Rate: 108 (09/27 2000)  Labs: Recent Labs    07/03/22 2054 07/03/22 2311 07/04/22 0130 07/04/22 1021 07/05/22 0007 07/05/22 0500 07/06/22 0332 07/06/22 1154 07/06/22 1942  HGB  --    < > 8.8* 9.2* 9.5* 9.1*  --   --   --   HCT  --    < > 26.2* 27.0* 28.8* 26.7*  --   --   --   PLT  --   --  171  --  147* 157  --   --   --   APTT  --   --   --   --   --   --   --  38* 73*  LABPROT 23.3*  --  23.1*  --   --   --   --   --   --   INR 2.1*  --  2.1*  --   --   --   --   --   --   HEPARINUNFRC  --   --   --   --   --   --   --  >1.10* >1.10*  CREATININE  --   --  1.05  --   --  1.05 1.09  --   --    < > = values in this interval not displayed.     Estimated Creatinine Clearance: 59.5 mL/min (by C-G formula based on SCr of 1.09 mg/dL).   Medical History: Past Medical History:  Diagnosis Date   Chest pain    Diabetes mellitus    DM2 (diabetes mellitus, type 2) ([facility])    GERD (gastroesophageal reflux disease)    HLD (hyperlipidemia)    HTN (hypertension)    PAF (paroxysmal atrial fibrillation) ([facility])    Palpitations     Medications:  Scheduled:   bethanechol  10 mg Per Tube TID   Chlorhexidine Gluconate Cloth  6 each Topical Q2000   feeding supplement (PROSource TF20)  60 mL Per Tube Daily   folic acid  1 mg Per Tube Daily   free water  100 mL Per Tube Q8H   insulin aspart  0-9 Units Subcutaneous Q4H   insulin aspart  4 Units Subcutaneous Q4H   lactulose  20 g Per Tube TID   midodrine  10 mg Per Tube Q8H   multivitamin with minerals  1 tablet Per Tube Daily   mupirocin cream   Topical Daily    mouth rinse  15 mL Mouth Rinse Q2H   pantoprazole  40 mg Per Tube Daily   polyethylene glycol  17 g Per Tube Daily   rifaximin  550 mg Per Tube BID   spironolactone  100 mg Per Tube Daily   [START ON 07/07/2022] thiamine  100 mg Oral Daily    Assessment: 76 y/o male admitted for altered mental status and bilateral pleural effusions. PMH of atrial fibrillation on eliquis PTA (last dose morning of 9/26). Apixaban held in the setting of increasing LFTs. Pharmacy was consulted to dose heparin.   Most recent hgb 9.6, plt 157. Given timing of last apixaban dose, will not give bolus and start  infusion.  9/27 PM aPTT: 73 seconds / HL >1.10 (still under the influence of apixaban) aPTT is therapeutic. No signs of bleeding No issues with the infusion  Goal of Therapy:  Heparin level 0.3-0.7 units/ml aPTT 66-102 seconds Monitor platelets by anticoagulation protocol: Yes   Plan:  Continue heparin gtt at 1100 units/hour Re-check heparin and aPTT level with AM labs Monitor daily CBC and s/sx bleeding  Halah Whiteside BS, PharmD, BCPS Clinical Pharmacist 07/06/2022 8:33 PM  Contact: [phone] after 3 PM  "Be curious, not judgmental..." -Jamal Maes

## 2022-07-07 DIAGNOSIS — Z515 Encounter for palliative care: Secondary | ICD-10-CM | POA: Diagnosis not present

## 2022-07-07 DIAGNOSIS — G9340 Encephalopathy, unspecified: Secondary | ICD-10-CM | POA: Diagnosis not present

## 2022-07-07 DIAGNOSIS — Z7189 Other specified counseling: Secondary | ICD-10-CM | POA: Diagnosis not present

## 2022-07-07 DIAGNOSIS — J90 Pleural effusion, not elsewhere classified: Secondary | ICD-10-CM | POA: Diagnosis not present

## 2022-07-07 DIAGNOSIS — J9601 Acute respiratory failure with hypoxia: Secondary | ICD-10-CM | POA: Diagnosis not present

## 2022-07-07 LAB — APTT: aPTT: 88 seconds — ABNORMAL HIGH (ref 24–36)

## 2022-07-07 LAB — GLUCOSE, CAPILLARY
Glucose-Capillary: 122 mg/dL — ABNORMAL HIGH (ref 70–99)
Glucose-Capillary: 146 mg/dL — ABNORMAL HIGH (ref 70–99)
Glucose-Capillary: 147 mg/dL — ABNORMAL HIGH (ref 70–99)
Glucose-Capillary: 147 mg/dL — ABNORMAL HIGH (ref 70–99)
Glucose-Capillary: 157 mg/dL — ABNORMAL HIGH (ref 70–99)
Glucose-Capillary: 164 mg/dL — ABNORMAL HIGH (ref 70–99)

## 2022-07-07 LAB — BASIC METABOLIC PANEL
Anion gap: 12 (ref 5–15)
BUN: 62 mg/dL — ABNORMAL HIGH (ref 8–23)
CO2: 22 mmol/L (ref 22–32)
Calcium: 8.6 mg/dL — ABNORMAL LOW (ref 8.9–10.3)
Chloride: 114 mmol/L — ABNORMAL HIGH (ref 98–111)
Creatinine, Ser: 1.13 mg/dL (ref 0.61–1.24)
GFR, Estimated: >60 mL/min (ref >60)
Glucose, Bld: 214 mg/dL — ABNORMAL HIGH (ref 70–99)
Potassium: 3.4 mmol/L — ABNORMAL LOW (ref 3.5–5.1)
Sodium: 148 mmol/L — ABNORMAL HIGH (ref 135–145)

## 2022-07-07 LAB — CBC
HCT: 25.6 % — ABNORMAL LOW (ref 39.0–52.0)
Hemoglobin: 8.2 g/dL — ABNORMAL LOW (ref 13.0–17.0)
MCH: 33.1 pg (ref 26.0–34.0)
MCHC: 32 g/dL (ref 30.0–36.0)
MCV: 103.2 fL — ABNORMAL HIGH (ref 80.0–100.0)
Platelets: 244 10*3/uL (ref 150–400)
RBC: 2.48 MIL/uL — ABNORMAL LOW (ref 4.22–5.81)
RDW: 15.6 % — ABNORMAL HIGH (ref 11.5–15.5)
WBC: 13.7 10*3/uL — ABNORMAL HIGH (ref 4.0–10.5)
nRBC: 0 % (ref 0.0–0.2)

## 2022-07-07 LAB — CULTURE, RESPIRATORY W GRAM STAIN: Gram Stain: NONE SEEN

## 2022-07-07 LAB — MAGNESIUM: Magnesium: 2.4 mg/dL (ref 1.7–2.4)

## 2022-07-07 LAB — HEPARIN LEVEL (UNFRACTIONATED): Heparin Unfractionated: >1.1 IU/mL — ABNORMAL HIGH (ref 0.30–0.70)

## 2022-07-07 MED ORDER — POTASSIUM CHLORIDE 20 MEQ PO PACK
40.0000 meq | PACK | Freq: Once | ORAL | Status: AC
  Administered 2022-07-07: 40 meq
  Filled 2022-07-07: qty 2

## 2022-07-07 MED ORDER — FUROSEMIDE 10 MG/ML IJ SOLN
40.0000 mg | Freq: Two times a day (BID) | INTRAMUSCULAR | Status: DC
  Administered 2022-07-07 – 2022-07-08 (×3): 40 mg via INTRAVENOUS
  Filled 2022-07-07 (×3): qty 4

## 2022-07-07 NOTE — Progress Notes (Signed)
SLP Cancellation Note  Patient Details Name: Darrell Horne MRN: [id number] DOB: 1946/03/04   Cancelled treatment:       Reason Eval/Treat Not Completed: Medical issues which prohibited therapy (remains on vent).     Osie Bond., M.A. [place] Office [phone]  Secure chat preferred  07/07/2022, 7:38 AM

## 2022-07-07 NOTE — Progress Notes (Signed)
Palliative:  HPI: 76 year old male with a past medical history of paroxysmal A-fib on Eliquis, left BKA, HTN, HLD, and alcoholic cirrhosis who presented to the hospital with altered mental status and was admitted on 06/16/2022. He was found to have a large right-sided pleural effusion and his intermittently increased since August 26.  Thoracentesis on 06/17/2022 with 2 L of yellow fluid was found to be transudative pleural effusion. Post procedure developed increased oxygen requirements and hypotension. 06/22/2022 PCCM consulted for evaluation and management of his right-sided pleural effusion and acute hypoxic respiratory failure.    I met again today at [name] bedside. He has no significant changes since yesterday. Back on low dose levophed. Otherwise no signs of improvement.   I called and spoke again with wife/HCPOA, Darrell Horne. Darrell Horne shares that she came to see Darrell Horne. He had no improved response to Darrell Horne than he has to medical staff. We reviewed the difficult discussion we had yesterday and ongoing poor prognosis. Darrell Horne has no further questions or concerns at this time. She recognizes that he is not showing signs of improvement. She is not yet ready to make further decisions. I will give her some more time to process his decline. Will follow up with her again tomorrow.   All questions/concerns addressed. Emotional support provided.   Exam: Unresponsive, agitated at times. Shakes head with agitation but no further response. Does not open eyes. Vent on pressure support - tolerating. Abd appears less distended, soft. Back on low dose vasopressor support.   Plan: - DNR. - Allowing time for family to process poor prognosis. Will further discuss path forward tomorrow.   25 min  Vinie Sill, NP Palliative Medicine Team Pager [phone] (Please see [email or web address] for schedule) Team Phone [phone]    Greater than 50%  of this time was spent counseling and coordinating care related to the above  assessment and plan

## 2022-07-07 NOTE — Progress Notes (Signed)
PT Cancellation Note  Patient Details Name: Darrell Horne MRN: [id number] DOB: 14-Mar-1946   Cancelled Treatment:    Reason Eval/Treat Not Completed: Medical issues which prohibited therapy.  Pt "looking rough", status tenuous, will hold again today. 07/07/2022  Ginger Carne., PT Acute Rehabilitation Services [phone]  (office)   [name] 07/07/2022, 10:06 AM

## 2022-07-07 NOTE — Progress Notes (Signed)
ANTICOAGULATION CONSULT NOTE - Follow-up  Pharmacy Consult for heparin Indication: atrial fibrillation  No Known Allergies  Patient Measurements: Height: '5\' 10"'$  (177.8 cm) Weight: 77 kg (169 lb 12.1 oz) IBW/kg (Calculated) : 73 Heparin Dosing Weight: 77 kg  Vital Signs: Temp: 99.1 F (37.3 C) (09/28 0801) Temp Source: Axillary (09/28 0801) BP: 121/69 (09/28 0915) Pulse Rate: 109 (09/28 0915)  Labs: Recent Labs    07/05/22 0007 07/05/22 0500 07/06/22 0332 07/06/22 1154 07/06/22 1942 07/07/22 0439  HGB 9.5* 9.1*  --   --   --  8.2*  HCT 28.8* 26.7*  --   --   --  25.6*  PLT 147* 157  --   --   --  244  APTT  --   --   --  38* 73* 88*  HEPARINUNFRC  --   --   --  >1.10* >1.10* >1.10*  CREATININE  --  1.05 1.09  --   --  1.13     Estimated Creatinine Clearance: 57.4 mL/min (by C-G formula based on SCr of 1.13 mg/dL).   Medical History: Past Medical History:  Diagnosis Date   Chest pain    Diabetes mellitus    DM2 (diabetes mellitus, type 2) (HCC)    GERD (gastroesophageal reflux disease)    HLD (hyperlipidemia)    HTN (hypertension)    PAF (paroxysmal atrial fibrillation) ([facility])    Palpitations     Medications:  Scheduled:   bethanechol  10 mg Per Tube TID   Chlorhexidine Gluconate Cloth  6 each Topical Q2000   feeding supplement (PROSource TF20)  60 mL Per Tube Daily   folic acid  1 mg Per Tube Daily   free water  100 mL Per Tube Q8H   insulin aspart  0-9 Units Subcutaneous Q4H   insulin aspart  4 Units Subcutaneous Q4H   lactulose  20 g Per Tube TID   midodrine  10 mg Per Tube Q8H   multivitamin with minerals  1 tablet Per Tube Daily   mupirocin cream   Topical Daily   mouth rinse  15 mL Mouth Rinse Q2H   pantoprazole  40 mg Per Tube Daily   polyethylene glycol  17 g Per Tube Daily   rifaximin  550 mg Per Tube BID   spironolactone  100 mg Per Tube Daily   thiamine  100 mg Oral Daily    Assessment: 76 y/o male admitted for altered mental status  and bilateral pleural effusions. PMH of atrial fibrillation on eliquis PTA (last dose morning of 9/26). Apixaban held in the setting of increasing LFTs. Pharmacy was consulted to dose heparin.   Heparin level is >1.10 (still affected by recent apixaban use), aPTT is therapeutic at 88. No issues with infusion reported. No signs of bleeding. Hgb 8.2, plt 244.  Goal of Therapy:  Heparin level 0.3-0.7 units/ml aPTT 66-102 seconds Monitor platelets by anticoagulation protocol: Yes   Plan:  Continue heparin gtt at 1100 units/hour Check daily heparin and aPTT levels until correlating Monitor daily CBC and s/sx bleeding  Louanne Belton, PharmD, [facility] PGY1 Pharmacy Resident 07/07/2022 9:40 AM

## 2022-07-07 NOTE — Progress Notes (Signed)
OT Cancellation Note  Patient Details Name: Darrell Horne MRN: [id number] DOB: Sep 07, 1946   Cancelled Treatment:    Reason Eval/Treat Not Completed: Medical issues which prohibited therapy;Patient's level of consciousness (Will continue to follow.)  [name] 07/07/2022, 8:35 AM Cleta Alberts, OTR/L Acute Rehabilitation Services Office: [phone]

## 2022-07-07 NOTE — Progress Notes (Addendum)
NAME:  Darrell Horne, MRN:  [id number], DOB:  20-Jun-1946, LOS: 21 ADMISSION DATE:  06/16/2022, CONSULTATION DATE:  06/22/2022 REFERRING MD:  Dr. Louanne Belton, CHIEF COMPLAINT:  Acute Hypoxic Respiratory Failure, Pleural Effusion   History of Present Illness:  Darrell Horne is a 76 year old male with a past medical history of paroxysmal A-fib on Eliquis, left BKA, HTN, HLD, and alcoholic cirrhosis who presented to the hospital with altered mental status and was initially admitted on 06/16/2022 with hepatic hydrothorax. PCCM was consulted for further evaluation and management of his right-sided pleural effusion and acute hypoxic respiratory failure. He is s/p thoracenteses and b/l chest tube placement. On 9/22, he developed  progressive confusion requiring intubation on 9/25 for airway protection.    Noted to have cirrhosis on imaging in 2015.  Right upper quadrant ultrasound this admission with cirrhotic liver but only trace ascites  Pertinent  Medical History    has a past medical history of Chest pain, Diabetes mellitus, DM2 (diabetes mellitus, type 2) ([place]), GERD (gastroesophageal reflux disease), HLD (hyperlipidemia), HTN (hypertension), PAF (paroxysmal atrial fibrillation) ([place]), and Palpitations.   has a past surgical history that includes Total ankle arthroplasty ([id number]); Cholecystectomy; Shoulder surgery ([id number]); Hernia repair; Appendectomy; Leg amputation (Left, [id number]); Colonoscopy; Colonoscopy with propofol (N/A, 11/06/2018); polypectomy (11/06/2018); and IR THORACENTESIS ASP PLEURAL SPACE W/IMG GUIDE (06/17/2022).    Significant Hospital Events: Including procedures, antibiotic start and stop dates in addition to other pertinent events   9/8 thoracentesis with return of 1.2 L of transudative fluid 06/18/2022: Stage II partial-thickness left medial buttock pressure injury noted low 9/12 increasing oxygen requirements with up to 6 L high flow nasal cannula and episodes of hypotension with  systolic of 89.  Albumin, midodrine, and tube feeds started.  Core track placed. 9/13 PCCM consulted, Eliquis held for planned thoracentesis tomorrow. CXR with stable pulm edema and pleural effusions 9/14 increased O2 requirements, stable on BiPAP now. Bilateral chest tubes placed with return of 6 L of yellow fluid. Off BiPAP 9/15 doing well off BiPAP overnight, on 5L [place], slowed drainage from bilat chest tubes, drastically improved peripheral edema.  9/16 off bipap. Remains off pressors. Stable to transfer out of ICU  9/19 - Lactulose held due to diarrhea . More responsive today. Bilateral chest tubes clamped since 9/18.  Transudative pleural effusion deemed due to cirrhosis and cardiac impairment.  Removal of chest tubes.  Oxygen level 96% on room air.  With diuresis he is -18 L.  Delirium thought to be slowly improving ammonia level 20.  Lactulose is being continued.  For A-fib oral amiodarone and oral metoprolol continued with Eliquis.  Being maintained on midodrine as well.   Critical care medicine signed off 06/29/2022: Continue diuresis.  Improved alertness noticed. 07/01/2022: Worsening confusion but also fluctuating confusion.  Continue diuresis. 07/02/2022: Lethargic worsening confusion.  Afebrile per hospitalist 07/03/2022: PCCM Consulted for confusion and respiratory distress, transferred to ICU 9/25: Intubated  9/26: Bilateral thoracentesis w/ 1200 cc's serosnanguinous fluid drained from right pleural space and 1500 ccs of yellowish fluid from left pleural space. Had large left-sided pneumothorax afterward with pigtail chest tube placed  Interim History / Subjective:  Febrile overnight to 101.61F T max, last fever at 2348 to 100.7 F, received Tylenol. Remains intubated. On Lasix, Levophed, Precedex, Heparin gtt.  Yesterday, palliative care had discussion with wife/HCPOA [name] regarding prognosis and code status was changed to DNR. Not yet prepared for comfort care but will have ongoing  discussions.  Objective   Blood  pressure (!) 106/59, pulse (!) 114, temperature 100 F (37.8 C), temperature source Oral, resp. rate (!) 21, height '5\' 10"'$  (1.778 m), weight 77 kg, SpO2 100 %.    Vent Mode: PRVC FiO2 (%):  [30 %] 30 % Set Rate:  [16 bmp] 16 bmp Vt Set:  [580 mL] 580 mL PEEP:  [5 cmH20] 5 cmH20 Pressure Support:  [10 cmH20] 10 cmH20   Intake/Output Summary (Last 24 hours) at 07/07/2022 0704 Last data filed at 07/07/2022 6226 Gross per 24 hour  Intake 8264.2 ml  Output 2810 ml  Net 5454.2 ml    Filed Weights   07/05/22 0500 07/06/22 0500 07/07/22 0346  Weight: 77.5 kg 77 kg 77 kg    Labs: Blood cultures negative at 4 days Mg 2.4 BMP- Na 148 (corrected: 150)- hypernatremic Glucose 214 Cr 1.13, BUN 62, GFR >60 K+ 3.4 WBC 13.7 Hgb 8.2  Lines: 2 left PIV in left arm PICC double lumen R brachial  Drains: Foley catheter Rectal pouch Chest tube- left    Physical exam: General: Crtitically ill-appearing male, orally intubated HEENT:  ETT and OGT in place Neuro: Does not respond to voice, not following commands, shakes head intermittently not in response to vocal stimuli Chest: Clear. Left pigtail catheter in place without air leak. Heart: A fib with regular rate, no murmurs or gallops Abdomen: Soft, slightly distended, bowel sounds present Skin: No rash  Resolved Hospital Problem list     Assessment & Plan:  Multifactorial acute hypoxemic respiratory failure d/t encephalopathy in setting of liver cirrhosis Possible superimposed pneumonia and acute on chronic b/l pleural effusions Confirmed ESBL w/ multidrug Klebsiella, E faecalis, Staph aureus on respiratory cultures, started on Meropenem 9/27 and on Vancomycin since 9/27. Susceptibilities confirm he does not have VRE and Meropenem should cover for all bacteria on respiratory culture. Still requiring Levophed, but weaned to 1 mcg/hr. Goal to continue to decrease sedation, SBT and evaluate if  extubation will be successful with further GOC discussions with family. - Continue Meropenem (9/27)  - Discontinue Vancomycin  - Continue Midodrine and Levo for MAP goal > 65; wean Levo as able - Continue lung protective ventilation - Avoid deep sedation; RASS goal 0/-1 - wean precedex  Portal Hypertension Decompensated alcoholic liver cirrhosis, ascites, pleural effusion, volume overload Coagulopathy Complicated volume management. Plan to continue to decrease diuresis as below, especially in light of renal function with increasing BUN. - Discontinue Lasix infusion to 5 mg/hr - Start IV Lasix 40 mg BID to start tonight - Continue lactulose and rifaximin for HE - Continue spironolactone - Follow I/Os  Paroxysmal Afib, with RVR Prolonged Qtc HR in low 100s overnight - Hold metoprolol  - Hold Eliquis for CVA ppx given increase LFTs; CHADSVASC score 4 - Continue heparin gtt   Anemia of chronic disease Monitor H&H, Hgb 8.2 - Monitor for signs of bleeding   GERD PPI  Malnutrition -severe protein calorie Hyperglycemia, likely stress/tube feed related Hypokalemia CBGs above goal, - Continue tube feeds - 4 units lispro Q4h; goal CBG 140-180  Pressure injury stage II left medial buttock - Present on Admit Continue wound care  Hypokalemia K+ 3.4 - Replete with 40 mEq  Best Practice (right click and "Reselect all SmartList Selections" daily)   Diet/type: Tube feeds DVT prophylaxis: LMWH  GI prophylaxis: PPI Lines: Yes needed Foley:  N/A Code Status:  full code Last date of multidisciplinary goals of care discussion [9/27 with palliative medicine- patient made DNR]  LABS  PULMONARY Recent Labs  Lab 07/02/22 1826 07/03/22 0850 07/03/22 2311 07/03/22 2320 07/04/22 1021  PHART  --  7.44 7.815* 7.424 7.385  PCO2ART  --  36 <15.0* 34.8 41.4  PO2ART  --  166* 17* 92 448*  HCO3 27.9 24.2 22.2 22.7 25.0  TCO2  --   --  '23 24 26  '$ O2SAT 50.4 99.1 97 97 100      CBC Recent Labs  Lab 07/05/22 0007 07/05/22 0500 07/07/22 0439  HGB 9.5* 9.1* 8.2*  HCT 28.8* 26.7* 25.6*  WBC 16.4* 15.4* 13.7*  PLT 147* 157 244     COAGULATION Recent Labs  Lab 07/03/22 2054 07/04/22 0130  INR 2.1* 2.1*     CARDIAC  No results for input(s): "TROPONINI" in the last 168 hours. No results for input(s): "PROBNP" in the last 168 hours.   CHEMISTRY Recent Labs  Lab 07/01/22 0757 07/02/22 0244 07/03/22 0056 07/03/22 2045 07/03/22 2311 07/04/22 0130 07/04/22 1021 07/05/22 0500 07/06/22 0332 07/07/22 0439  NA 135 133* 137 136   < > 138 140 138 147* 148*  K 4.2 4.1 4.3 4.3   < > 3.9 3.7 3.0* 3.3* 3.4*  CL 104 105 105 106  --  106  --  112* 116* 114*  CO2 '26 22 23 '$ 20*  --  23  --  '23 22 22  '$ GLUCOSE 149* 145* 165* 205*  --  133*  --  210* 237* 214*  BUN 22 27* 40* 53*  --  56*  --  61* 55* 62*  CREATININE 0.73 0.76 0.89 1.09  --  1.05  --  1.05 1.09 1.13  CALCIUM 8.8* 9.1 9.2 9.0  --  9.3  --  8.4* 8.7* 8.6*  MG 2.2 2.2 2.3 2.4  --  2.5*  --  2.4 2.3 2.4  PHOS 2.7 2.6 3.1 3.6  --  3.4  --   --   --   --    < > = values in this interval not displayed.    Estimated Creatinine Clearance: 57.4 mL/min (by C-G formula based on SCr of 1.13 mg/dL).   LIVER Recent Labs  Lab 07/03/22 0056 07/03/22 2045 07/03/22 2054 07/04/22 0130 07/05/22 0500 07/06/22 1000  AST 85* 97*  --  107* 156* 208*  ALT 78* 79*  --  81* 118* 165*  ALKPHOS 200* 213*  --  201* 255* 255*  BILITOT 2.0* 1.9*  --  1.8* 2.5* 1.9*  PROT 6.3* 7.1  --  5.9* 5.9* 5.6*  ALBUMIN 3.1* 2.7*  --  2.6* 2.2* 1.9*  INR  --   --  2.1* 2.1*  --   --       INFECTIOUS Recent Labs  Lab 07/03/22 2054 07/04/22 0130 07/05/22 0500  LATICACIDVEN 1.7 1.1  --   PROCALCITON  --  1.13 1.57      ENDOCRINE CBG (last 3)  Recent Labs    07/06/22 1938 07/06/22 2346 07/07/22 0337  GLUCAP 167* [place], DO PGY-2 [place]

## 2022-07-08 DIAGNOSIS — Z515 Encounter for palliative care: Secondary | ICD-10-CM | POA: Diagnosis not present

## 2022-07-08 DIAGNOSIS — J9601 Acute respiratory failure with hypoxia: Secondary | ICD-10-CM | POA: Diagnosis not present

## 2022-07-08 DIAGNOSIS — Z7189 Other specified counseling: Secondary | ICD-10-CM | POA: Diagnosis not present

## 2022-07-08 DIAGNOSIS — G9340 Encephalopathy, unspecified: Secondary | ICD-10-CM | POA: Diagnosis not present

## 2022-07-08 LAB — BASIC METABOLIC PANEL
Anion gap: 0 — ABNORMAL LOW (ref 5–15)
Anion gap: 7 (ref 5–15)
BUN: 57 mg/dL — ABNORMAL HIGH (ref 8–23)
BUN: 64 mg/dL — ABNORMAL HIGH (ref 8–23)
CO2: 25 mmol/L (ref 22–32)
CO2: 24 mmol/L (ref 22–32)
Calcium: 7.4 mg/dL — ABNORMAL LOW (ref 8.9–10.3)
Calcium: 8.6 mg/dL — ABNORMAL LOW (ref 8.9–10.3)
Chloride: 125 mmol/L — ABNORMAL HIGH (ref 98–111)
Chloride: 122 mmol/L — ABNORMAL HIGH (ref 98–111)
Creatinine, Ser: 0.96 mg/dL (ref 0.61–1.24)
Creatinine, Ser: 1.14 mg/dL (ref 0.61–1.24)
GFR, Estimated: >60 mL/min (ref >60)
GFR, Estimated: >60 mL/min (ref >60)
Glucose, Bld: 170 mg/dL — ABNORMAL HIGH (ref 70–99)
Glucose, Bld: 159 mg/dL — ABNORMAL HIGH (ref 70–99)
Potassium: 6.9 mmol/L (ref 3.5–5.1)
Potassium: 4.4 mmol/L (ref 3.5–5.1)
Sodium: 150 mmol/L — ABNORMAL HIGH (ref 135–145)
Sodium: 153 mmol/L — ABNORMAL HIGH (ref 135–145)

## 2022-07-08 LAB — COMPREHENSIVE METABOLIC PANEL WITH GFR
ALT: 171 U/L — ABNORMAL HIGH (ref 0–44)
AST: 165 U/L — ABNORMAL HIGH (ref 15–41)
Albumin: 1.6 g/dL — ABNORMAL LOW (ref 3.5–5.0)
Alkaline Phosphatase: 200 U/L — ABNORMAL HIGH (ref 38–126)
Anion gap: 3 — ABNORMAL LOW (ref 5–15)
BUN: 60 mg/dL — ABNORMAL HIGH (ref 8–23)
CO2: 22 mmol/L (ref 22–32)
Calcium: 7.8 mg/dL — ABNORMAL LOW (ref 8.9–10.3)
Chloride: 126 mmol/L — ABNORMAL HIGH (ref 98–111)
Creatinine, Ser: 1.13 mg/dL (ref 0.61–1.24)
GFR, Estimated: >60 mL/min
Glucose, Bld: 206 mg/dL — ABNORMAL HIGH (ref 70–99)
Potassium: 2.8 mmol/L — ABNORMAL LOW (ref 3.5–5.1)
Sodium: 151 mmol/L — ABNORMAL HIGH (ref 135–145)
Total Bilirubin: 0.9 mg/dL (ref 0.3–1.2)
Total Protein: 5.2 g/dL — ABNORMAL LOW (ref 6.5–8.1)

## 2022-07-08 LAB — GLUCOSE, CAPILLARY
Glucose-Capillary: 228 mg/dL — ABNORMAL HIGH (ref 70–99)
Glucose-Capillary: 151 mg/dL — ABNORMAL HIGH (ref 70–99)
Glucose-Capillary: 140 mg/dL — ABNORMAL HIGH (ref 70–99)
Glucose-Capillary: 160 mg/dL — ABNORMAL HIGH (ref 70–99)
Glucose-Capillary: 132 mg/dL — ABNORMAL HIGH (ref 70–99)
Glucose-Capillary: 130 mg/dL — ABNORMAL HIGH (ref 70–99)
Glucose-Capillary: 117 mg/dL — ABNORMAL HIGH (ref 70–99)

## 2022-07-08 LAB — CULTURE, BLOOD (ROUTINE X 2)
Culture: NO GROWTH
Culture: NO GROWTH
Special Requests: ADEQUATE
Special Requests: ADEQUATE

## 2022-07-08 LAB — CBC
HCT: 23.1 % — ABNORMAL LOW (ref 39.0–52.0)
Hemoglobin: 7.3 g/dL — ABNORMAL LOW (ref 13.0–17.0)
MCH: 33.2 pg (ref 26.0–34.0)
MCHC: 31.6 g/dL (ref 30.0–36.0)
MCV: 105 fL — ABNORMAL HIGH (ref 80.0–100.0)
Platelets: 191 10*3/uL (ref 150–400)
RBC: 2.2 MIL/uL — ABNORMAL LOW (ref 4.22–5.81)
RDW: 16.1 % — ABNORMAL HIGH (ref 11.5–15.5)
WBC: 7.2 10*3/uL (ref 4.0–10.5)
nRBC: 0 % (ref 0.0–0.2)

## 2022-07-08 LAB — APTT: aPTT: 78 s — ABNORMAL HIGH (ref 24–36)

## 2022-07-08 LAB — HEPARIN LEVEL (UNFRACTIONATED): Heparin Unfractionated: 0.41 [IU]/mL (ref 0.30–0.70)

## 2022-07-08 LAB — MAGNESIUM: Magnesium: 2.4 mg/dL (ref 1.7–2.4)

## 2022-07-08 MED ORDER — FREE WATER
200.0000 mL | Freq: Three times a day (TID) | Status: DC
  Administered 2022-07-08 – 2022-07-09 (×3): 200 mL

## 2022-07-08 MED ORDER — CHLORHEXIDINE GLUCONATE CLOTH 2 % EX PADS
6.0000 | MEDICATED_PAD | CUTANEOUS | Status: DC
  Administered 2022-07-08 – 2022-07-16 (×9): 6 via TOPICAL

## 2022-07-08 MED ORDER — INSULIN ASPART 100 UNIT/ML IJ SOLN
5.0000 [IU] | INTRAMUSCULAR | Status: DC
  Administered 2022-07-08 – 2022-07-12 (×24): 5 [IU] via SUBCUTANEOUS

## 2022-07-08 MED ORDER — NOREPINEPHRINE 4 MG/250ML-% IV SOLN
0.0000 ug/min | INTRAVENOUS | Status: DC
  Administered 2022-07-09: 1.5 ug/min via INTRAVENOUS
  Administered 2022-07-10: 4 ug/min via INTRAVENOUS
  Filled 2022-07-08 (×2): qty 250

## 2022-07-08 MED ORDER — LACTULOSE 10 GM/15ML PO SOLN
20.0000 g | Freq: Two times a day (BID) | ORAL | Status: DC
  Administered 2022-07-08 – 2022-07-12 (×9): 20 g
  Filled 2022-07-08 (×9): qty 30

## 2022-07-08 MED ORDER — POTASSIUM CHLORIDE 20 MEQ PO PACK
40.0000 meq | PACK | Freq: Once | ORAL | Status: AC
  Administered 2022-07-08: 40 meq
  Filled 2022-07-08: qty 2

## 2022-07-08 MED ORDER — POTASSIUM CHLORIDE 10 MEQ/50ML IV SOLN
10.0000 meq | INTRAVENOUS | Status: DC
  Administered 2022-07-08 (×3): 10 meq via INTRAVENOUS
  Filled 2022-07-08 (×3): qty 50

## 2022-07-08 MED ORDER — IPRATROPIUM-ALBUTEROL 0.5-2.5 (3) MG/3ML IN SOLN: 3.0000 mL | Freq: Four times a day (QID) | RESPIRATORY_TRACT | Status: DC | PRN

## 2022-07-08 MED ORDER — THIAMINE MONONITRATE 100 MG PO TABS
100.0000 mg | ORAL_TABLET | Freq: Every day | ORAL | Status: DC
  Administered 2022-07-09 – 2022-07-12 (×4): 100 mg
  Filled 2022-07-08 (×4): qty 1

## 2022-07-08 NOTE — Progress Notes (Signed)
OT Cancellation Note  Patient Details Name: Darrell Horne MRN: [id number] DOB: Oct 23, 1945   Cancelled Treatment:    Reason Eval/Treat Not Completed: Medical issues which prohibited therapy (Pt continues to have multiple medical concerns and unable to participate in therapy. Will sign off and await new order.)  Darrell Horne 07/08/2022, 12:34 PM Cleta Alberts, OTR/L Acute Rehabilitation Services Office: [phone]

## 2022-07-08 NOTE — Progress Notes (Signed)
BMP resulted at 0642 showing K at 6.9. Patient receiving K at this time via PICC line. MD & RN discussed waiting until K is finished to re-draw lab. Will redraw later in shift.

## 2022-07-08 NOTE — Progress Notes (Signed)
Palliative:  HPI: 76 year old male with a past medical history of paroxysmal A-fib on Eliquis, left BKA, HTN, HLD, and alcoholic cirrhosis who presented to the hospital with altered mental status and was admitted on 06/16/2022. He was found to have a large right-sided pleural effusion and his intermittently increased since August 26.  Thoracentesis on 06/17/2022 with 2 L of yellow fluid was found to be transudative pleural effusion. Post procedure developed increased oxygen requirements and hypotension. 06/22/2022 PCCM consulted for evaluation and management of his right-sided pleural effusion and acute hypoxic respiratory failure.    I met again today at Darrell Horne bedside. No real changes in status. I called and spoke again with wife/HCPOA [name]. [name] reports that she was here this morning. She reports no change although Mr. Lipari did attempt to open eyes when she was speaking with him but overall his status and response was unchanged. [name] reports that she was able to speak with a doctor while she was present at bedside. She reports that she was told that his mental status is still a problem for coming off ventilator and that they are trying to wean medication (Precedex) with hopes his mental state will improve. I discussed further with [name] my concern that his declined mental state is not just from the medication knowing he had ongoing confusion throughout most of his hospitalization. I worry that his liver dysfunction and overall health decline may be adding to this problem and that we may not be able to improve this. [name] expresses understanding and acknowledges this may be the case. [name] would like to continue with current care and Precedex weaning to see if there is improvement. I explain to [name] that I will have my colleagues follow and help her with path forward and difficult decisions if he does not show this improvement. [name] was appreciative of the conversation and support. [name] also asks permission to  share palliative contact number with niece Freda Munro if she wishes to call for information and I agreed - [name] gives permission for the team to speak openly with Freda Munro.   All questions/concerns addressed. Emotional support provided.   Exam: Unresponsive, agitated at times. Shakes head with agitation but no further response. Does not open eyes. Vent on pressure support - tolerating. Abd appears less distended, soft. Back on low dose vasopressor support.   Plan: - DNR - Family wish to allow time to wean sedation to see if he has improved mental status - I have been honest that I fear this will not be the answer to help Mr. Winrow improve - Ongoing goals of care and discussion regarding one way extubation vs transition to comfort measures  35 min  Vinie Sill, NP Palliative Medicine Team Pager [phone] (Please see [email or web address] for schedule) Team Phone [phone]    Greater than 50%  of this time was spent counseling and coordinating care related to the above assessment and plan

## 2022-07-08 NOTE — Progress Notes (Signed)
PT Cancellation Note  Patient Details Name: RUSHAWN CAPSHAW MRN: [id number] DOB: 1946-07-06   Cancelled Treatment:    Reason Eval/Treat Not Completed: Medical issues which prohibited therapy.  Still "tanked" up on Precedex. No go today. 07/08/2022  Ginger Carne., PT Acute Rehabilitation Services [phone]  (office)   Tessie Fass Petula Rotolo 07/08/2022, 10:08 AM

## 2022-07-08 NOTE — Progress Notes (Signed)
ANTICOAGULATION CONSULT NOTE - Follow-up  Pharmacy Consult for heparin Indication: atrial fibrillation  No Known Allergies  Patient Measurements: Height: '5\' 10"'$  (177.8 cm) Weight: 76.2 kg (167 lb 15.9 oz) IBW/kg (Calculated) : 73 Heparin Dosing Weight: 77 kg  Vital Signs: Temp: 97.5 F (36.4 C) (09/29 0752) Temp Source: Oral (09/29 0752) BP: 98/52 (09/29 0500) Pulse Rate: 96 (09/29 0800)  Labs: Recent Labs    07/06/22 1942 07/07/22 0439 07/08/22 0454 07/08/22 0642  HGB  --  8.2* 7.3*  --   HCT  --  25.6* 23.1*  --   PLT  --  244 191  --   APTT 73* 88* 78*  --   HEPARINUNFRC >1.10* >1.10* 0.41  --   CREATININE  --  1.13 1.13 0.96     Estimated Creatinine Clearance: 67.6 mL/min (by C-G formula based on SCr of 0.96 mg/dL).   Medical History: Past Medical History:  Diagnosis Date   Chest pain    Diabetes mellitus    DM2 (diabetes mellitus, type 2) ([facility])    GERD (gastroesophageal reflux disease)    HLD (hyperlipidemia)    HTN (hypertension)    PAF (paroxysmal atrial fibrillation) ([facility])    Palpitations     Medications:  Scheduled:   bethanechol  10 mg Per Tube TID   Chlorhexidine Gluconate Cloth  6 each Topical Q2000   feeding supplement (PROSource TF20)  60 mL Per Tube Daily   folic acid  1 mg Per Tube Daily   free water  100 mL Per Tube Q8H   furosemide  40 mg Intravenous BID   insulin aspart  0-9 Units Subcutaneous Q4H   insulin aspart  4 Units Subcutaneous Q4H   lactulose  20 g Per Tube TID   midodrine  10 mg Per Tube Q8H   multivitamin with minerals  1 tablet Per Tube Daily   mupirocin cream   Topical Daily   mouth rinse  15 mL Mouth Rinse Q2H   pantoprazole  40 mg Per Tube Daily   rifaximin  550 mg Per Tube BID   spironolactone  100 mg Per Tube Daily   thiamine  100 mg Oral Daily    Assessment: 76 y/o male admitted for altered mental status and bilateral pleural effusions. PMH of atrial fibrillation on eliquis PTA (last dose morning of 9/26).  Apixaban held in the setting of increasing LFTs. Pharmacy was consulted to dose heparin.   Heparin level is 0.41, aPTT is therapeutic at 78. No issues with infusion reported. No signs of bleeding. Hgb 7.3, plt 191.  Goal of Therapy:  Heparin level 0.3-0.7 units/ml aPTT 66-102 seconds Monitor platelets by anticoagulation protocol: Yes   Plan:  Continue heparin gtt at 1100 units/hour Check daily heparin and aPTT levels until correlating Monitor daily CBC and s/sx bleeding  Alanda Slim, PharmD, [facility] Clinical Pharmacist Please see AMION for all Pharmacists' Contact Phone Numbers 07/08/2022, 8:17 AM

## 2022-07-08 NOTE — Progress Notes (Signed)
PT Cancellation Note  Patient Details Name: Darrell Horne MRN: [id number] DOB: 06-Sep-1946   Cancelled Treatment:    Reason Eval/Treat Not Completed: Other (comment) (will sign off and ask for a reorder when pt is appropriate.) 07/08/2022  Ginger Carne., PT Acute Rehabilitation Services [phone]  (office)   Tessie Fass Darica Goren 07/08/2022, 3:05 PM

## 2022-07-08 NOTE — Progress Notes (Signed)
SLP Cancellation Note  Patient Details Name: Darrell Horne MRN: [id number] DOB: Sep 06, 1946   Cancelled treatment:       Reason Eval/Treat Not Completed: Patient not medically ready - remains on vent. Will sign off for now. Please reorder when ready.     Osie Bond., M.A. [place] Office [phone]  Secure chat preferred  07/08/2022, 7:56 AM

## 2022-07-08 NOTE — Progress Notes (Signed)
[facility] ADULT ICU REPLACEMENT PROTOCOL   The patient does apply for the [facility] Adult ICU Electrolyte Replacment Protocol based on the criteria listed below:   1.Exclusion criteria: TCTS patients, ECMO patients, and Dialysis patients 2. Is GFR >/= 30 ml/min? Yes.    Patient's GFR today is >60 3. Is SCr </= 2? No. Patient's SCr is 1.13 mg/dL 4. Did SCr increase >/= 0.5 in 24 hours? No. 5.Pt's weight >40kg  Yes.   6. Abnormal electrolyte(s): K 2.8  7. Electrolytes replaced per protocol 8.  Call MD STAT for K+ </= 2.5, Phos </= 1, or Mag </= 1 Physician:    Ronda Fairly A 07/08/2022 6:15 AM

## 2022-07-08 NOTE — Progress Notes (Signed)
NAME:  Darrell Horne, MRN:  [id number], DOB:  1946-05-15, LOS: 16 ADMISSION DATE:  06/16/2022, CONSULTATION DATE:  06/22/2022 REFERRING MD:  Dr. Louanne Belton, CHIEF COMPLAINT:  Acute Hypoxic Respiratory Failure, Pleural Effusion   History of Present Illness:  Darrell Horne is a 76 year old male with a past medical history of paroxysmal A-fib on Eliquis, left BKA, HTN, HLD, and alcoholic cirrhosis who presented to the hospital with altered mental status and was initially admitted on 06/16/2022 with hepatic hydrothorax. PCCM was consulted for further evaluation and management of his right-sided pleural effusion and acute hypoxic respiratory failure. He is s/p thoracenteses and b/l chest tube placement. On 9/22, he developed  progressive confusion requiring intubation on 9/25 for airway protection.    Noted to have cirrhosis on imaging in 2015.  Right upper quadrant ultrasound this admission with cirrhotic liver but only trace ascites  Pertinent  Medical History    has a past medical history of Chest pain, Diabetes mellitus, DM2 (diabetes mellitus, type 2) ([place]), GERD (gastroesophageal reflux disease), HLD (hyperlipidemia), HTN (hypertension), PAF (paroxysmal atrial fibrillation) ([place]), and Palpitations.   has a past surgical history that includes Total ankle arthroplasty (02409735); Cholecystectomy; Shoulder surgery (32992426); Hernia repair; Appendectomy; Leg amputation (Left, [id number]); Colonoscopy; Colonoscopy with propofol (N/A, 11/06/2018); polypectomy (11/06/2018); and IR THORACENTESIS ASP PLEURAL SPACE W/IMG GUIDE (06/17/2022).    Significant Hospital Events: Including procedures, antibiotic start and stop dates in addition to other pertinent events   9/8 thoracentesis with return of 1.2 L of transudative fluid 06/18/2022: Stage II partial-thickness left medial buttock pressure injury noted low 9/12 increasing oxygen requirements with up to 6 L high flow nasal cannula and episodes of hypotension with  systolic of 89.  Albumin, midodrine, and tube feeds started.  Core track placed. 9/13 PCCM consulted, Eliquis held for planned thoracentesis tomorrow. CXR with stable pulm edema and pleural effusions 9/14 increased O2 requirements, stable on BiPAP now. Bilateral chest tubes placed with return of 6 L of yellow fluid. Off BiPAP 9/15 doing well off BiPAP overnight, on 5L [place], slowed drainage from bilat chest tubes, drastically improved peripheral edema.  9/16 off bipap. Remains off pressors. Stable to transfer out of ICU  9/19 - Lactulose held due to diarrhea . More responsive today. Bilateral chest tubes clamped since 9/18.  Transudative pleural effusion deemed due to cirrhosis and cardiac impairment.  Removal of chest tubes.  Oxygen level 96% on room air.  With diuresis he is -18 L.  Delirium thought to be slowly improving ammonia level 20.  Lactulose is being continued.  For A-fib oral amiodarone and oral metoprolol continued with Eliquis.  Being maintained on midodrine as well.   Critical care medicine signed off 06/29/2022: Continue diuresis.  Improved alertness noticed. 07/01/2022: Worsening confusion but also fluctuating confusion.  Continue diuresis. 07/02/2022: Lethargic worsening confusion.  Afebrile per hospitalist 07/03/2022: PCCM Consulted for confusion and respiratory distress, transferred to ICU 9/25: Intubated  9/26: Bilateral thoracentesis w/ 1200 cc's serosnanguinous fluid drained from right pleural space and 1500 ccs of yellowish fluid from left pleural space. Had large left-sided pneumothorax afterward with pigtail chest tube placed  Interim History / Subjective:  Remains mechanically ventilated. Febrile overnight to 102.6 T max. Weaned off of norepinephrine. Remains intubated. On Precedex, Heparin gtt.  PleuraVac with 150 mL output bright orange fluid.  Objective   Blood pressure (!) 98/52, pulse 88, temperature (!) 97.5 F (36.4 C), temperature source Axillary, resp. rate 19,  height '5\' 10"'$  (1.778 m), weight 76.2  kg, SpO2 100 %.    Vent Mode: PRVC FiO2 (%):  [30 %] 30 % Set Rate:  [16 bmp] 16 bmp Vt Set:  [580 mL] 580 mL PEEP:  [5 cmH20] 5 cmH20 Pressure Support:  [10 cmH20] 10 cmH20 Plateau Pressure:  [17 cmH20-19 cmH20] 17 cmH20   Intake/Output Summary (Last 24 hours) at 07/08/2022 0706 Last data filed at 07/08/2022 0600 Gross per 24 hour  Intake 2814.06 ml  Output 3055 ml  Net -240.94 ml   Filed Weights   07/06/22 0500 07/07/22 0346 07/08/22 0500  Weight: 77 kg 77 kg 76.2 kg    Labs: Blood cultures negative at 5 days Mg 2.4 Hypernatremic:  Na 151 (corrected: 153) Glucose 206 Cr 0.96 BUN 60, GFR >60 Hypokalemic: K+ 2.8 Improving leukocytosis: WBC 7.2 (from 13.7) Anemic: Hgb 7.3 (from 8.2) Plt 191  Lines: 2 left PIV in left arm PICC double lumen R brachial  Drains: Foley catheter Rectal pouch Left pigtail catheter chest tube    Physical exam: General: Crtitically ill-appearing male, orally intubated HEENT:  ETT and OGT in place Neuro: Does not respond to voice, not following commands, shakes head intermittently not in response to vocal stimuli Chest: Coarse breath sounds. Left pigtail catheter in place without air leak. Heart: RRR, no murmurs or gallops Abdomen: Soft, slightly distended, bowel sounds present Skin: No rash  Resolved Hospital Problem list     Assessment & Plan:  Multifactorial acute hypoxemic respiratory failure d/t encephalopathy in setting of liver cirrhosis Possible superimposed pneumonia and acute on chronic b/l pleural effusions Improving leukocytosis but febrile x1 overnight. Continues to remain non-responsive to voice, intubated and sedated. Weaned entirely off of pressors. Goal to continue to decrease sedation, SBT and evaluate if extubation will be successful with further [place] discussions with family regarding formal withdrawal of care vs. Extubation without plans to reintubate. - Continue Meropenem (9/27)   - Continue Midodrine 10 mg q8h  - Continue lung protective ventilation - Repeat CXR 9/30 - Avoid deep sedation; RASS goal 0/-1 - Wean precedex  Portal Hypertension Decompensated alcoholic liver cirrhosis, ascites, pleural effusion, volume overload Coagulopathy Have been reducing diuresis regimen and balancing BP with volume. Adequate UOP at 1.58L in past 24 hours on BID Lasix. Stable renal function. Still encephalopathic. - Continue IV Lasix 40 mg BID - Decrease lactulose to 20 mg BID - Continue rifaximin for HE - Continue spironolactone - Follow I/Os  Paroxysmal Afib, with RVR Prolonged Qtc Rate controlled. NSR this mroning. - Hold Eliquis for CVA ppx given increase LFTs; CHADSVASC score 4 - Continue heparin gtt   Anemia of chronic disease Monitor H&H, Hgb downtrend to 7.3. No overt signs of bleeding on exam. - Monitor for signs of bleeding  Hypernatremia Na continues to rise, corrected 151 this morning. - Increase free water flushes to 200 mL q8h per tube   GERD PPI  Malnutrition -severe protein calorie Hyperglycemia, likely stress/tube feed related Hypokalemia CBGs above goal - Continue tube feeds - 4 units lispro Q4h; goal CBG 140-180  Pressure injury stage II left medial buttock - Present on Admit Continue wound care  Hypokalemia K+ 2.8 overnight with 40 mEq per tube given and 4 runs IV Kcl 10 mEq started. BMP this morning showed K+ 6.9 STAT repeat BMP for accuracy of hyperkalemia vs. Hydrolyzed sample. Doubt that repletion caused elevation in K+ -Hold repletion for now pending repeat lab  Best Practice (right click and "Reselect all SmartList Selections" daily)   Diet/type: Tube feeds  DVT prophylaxis: LMWH  GI prophylaxis: PPI Lines: Yes needed Foley:  N/A Code Status:  full code Last date of multidisciplinary goals of care discussion [9/27 with palliative medicine- patient made DNR]  LABS    PULMONARY Recent Labs  Lab 07/02/22 1826  07/03/22 0850 07/03/22 2311 07/03/22 2320 07/04/22 1021  PHART  --  7.44 7.815* 7.424 7.385  PCO2ART  --  36 <15.0* 34.8 41.4  PO2ART  --  166* 17* 92 448*  HCO3 27.9 24.2 22.2 22.7 25.0  TCO2  --   --  '23 24 26  '$ O2SAT 50.4 99.1 97 97 100    CBC Recent Labs  Lab 07/05/22 0500 07/07/22 0439 07/08/22 0454  HGB 9.1* 8.2* 7.3*  HCT 26.7* 25.6* 23.1*  WBC 15.4* 13.7* 7.2  PLT 157 244 191    COAGULATION Recent Labs  Lab 07/03/22 2054 07/04/22 0130  INR 2.1* 2.1*    CARDIAC  No results for input(s): "TROPONINI" in the last 168 hours. No results for input(s): "PROBNP" in the last 168 hours.   CHEMISTRY Recent Labs  Lab 07/01/22 0757 07/02/22 0244 07/03/22 0056 07/03/22 2045 07/03/22 2311 07/04/22 0130 07/04/22 1021 07/05/22 0500 07/06/22 0332 07/07/22 0439 07/08/22 0454  NA 135 133* 137 136   < > 138 140 138 147* 148* 151*  K 4.2 4.1 4.3 4.3   < > 3.9 3.7 3.0* 3.3* 3.4* 2.8*  CL 104 105 105 106  --  106  --  112* 116* 114* 126*  CO2 '26 22 23 '$ 20*  --  23  --  '23 22 22 22  '$ GLUCOSE 149* 145* 165* 205*  --  133*  --  210* 237* 214* 206*  BUN 22 27* 40* 53*  --  56*  --  61* 55* 62* 60*  CREATININE 0.73 0.76 0.89 1.09  --  1.05  --  1.05 1.09 1.13 1.13  CALCIUM 8.8* 9.1 9.2 9.0  --  9.3  --  8.4* 8.7* 8.6* 7.8*  MG 2.2 2.2 2.3 2.4  --  2.5*  --  2.4 2.3 2.4 2.4  PHOS 2.7 2.6 3.1 3.6  --  3.4  --   --   --   --   --    < > = values in this interval not displayed.   Estimated Creatinine Clearance: 57.4 mL/min (by C-G formula based on SCr of 1.13 mg/dL).   LIVER Recent Labs  Lab 07/03/22 2045 07/03/22 2054 07/04/22 0130 07/05/22 0500 07/06/22 1000 07/08/22 0454  AST 97*  --  107* 156* 208* 165*  ALT 79*  --  81* 118* 165* 171*  ALKPHOS 213*  --  201* 255* 255* 200*  BILITOT 1.9*  --  1.8* 2.5* 1.9* 0.9  PROT 7.1  --  5.9* 5.9* 5.6* 5.2*  ALBUMIN 2.7*  --  2.6* 2.2* 1.9* 1.6*  INR  --  2.1* 2.1*  --   --   --      INFECTIOUS Recent Labs  Lab  07/03/22 2054 07/04/22 0130 07/05/22 0500  LATICACIDVEN 1.7 1.1  --   PROCALCITON  --  1.13 1.57     ENDOCRINE CBG (last 3)  Recent Labs    07/07/22 1945 07/07/22 2350 07/08/22 [place], DO PGY-2 [place]

## 2022-07-08 NOTE — Progress Notes (Signed)
[facility] Progress Note Patient Name: Darrell Horne DOB: December 03, 1945 MRN: [id number]   Date of Service  07/08/2022  HPI/Events of Note  Hypotension - BP = 89/62 with MAP = 72.  eICU Interventions  Plan: Monitor CVP now and Q 4 hours.  Norepinephrine IV infusion. Titrate to MAP >= 65 and SBP >= 90.      Intervention Category Major Interventions: Hypotension - evaluation and management  Doll Frazee [place] 07/08/2022, 8:07 PM

## 2022-07-09 ENCOUNTER — Inpatient Hospital Stay (HOSPITAL_COMMUNITY): Payer: Medicare Other

## 2022-07-09 DIAGNOSIS — Z515 Encounter for palliative care: Secondary | ICD-10-CM | POA: Diagnosis not present

## 2022-07-09 DIAGNOSIS — Z7189 Other specified counseling: Secondary | ICD-10-CM

## 2022-07-09 DIAGNOSIS — G9340 Encephalopathy, unspecified: Secondary | ICD-10-CM | POA: Diagnosis not present

## 2022-07-09 DIAGNOSIS — J9601 Acute respiratory failure with hypoxia: Secondary | ICD-10-CM | POA: Diagnosis not present

## 2022-07-09 LAB — BASIC METABOLIC PANEL
Anion gap: 5 (ref 5–15)
Anion gap: 5 (ref 5–15)
Anion gap: 3 — ABNORMAL LOW (ref 5–15)
BUN: 61 mg/dL — ABNORMAL HIGH (ref 8–23)
BUN: 58 mg/dL — ABNORMAL HIGH (ref 8–23)
BUN: 50 mg/dL — ABNORMAL HIGH (ref 8–23)
CO2: 24 mmol/L (ref 22–32)
CO2: 24 mmol/L (ref 22–32)
CO2: 24 mmol/L (ref 22–32)
Calcium: 8.6 mg/dL — ABNORMAL LOW (ref 8.9–10.3)
Calcium: 8.7 mg/dL — ABNORMAL LOW (ref 8.9–10.3)
Calcium: 8.6 mg/dL — ABNORMAL LOW (ref 8.9–10.3)
Chloride: 125 mmol/L — ABNORMAL HIGH (ref 98–111)
Chloride: 122 mmol/L — ABNORMAL HIGH (ref 98–111)
Chloride: 125 mmol/L — ABNORMAL HIGH (ref 98–111)
Creatinine, Ser: 0.97 mg/dL (ref 0.61–1.24)
Creatinine, Ser: 0.93 mg/dL (ref 0.61–1.24)
Creatinine, Ser: 0.9 mg/dL (ref 0.61–1.24)
GFR, Estimated: >60 mL/min (ref >60)
GFR, Estimated: >60 mL/min (ref >60)
GFR, Estimated: >60 mL/min (ref >60)
Glucose, Bld: 198 mg/dL — ABNORMAL HIGH (ref 70–99)
Glucose, Bld: 173 mg/dL — ABNORMAL HIGH (ref 70–99)
Glucose, Bld: 186 mg/dL — ABNORMAL HIGH (ref 70–99)
Potassium: 4.2 mmol/L (ref 3.5–5.1)
Potassium: 4.4 mmol/L (ref 3.5–5.1)
Potassium: 4.2 mmol/L (ref 3.5–5.1)
Sodium: 154 mmol/L — ABNORMAL HIGH (ref 135–145)
Sodium: 151 mmol/L — ABNORMAL HIGH (ref 135–145)
Sodium: 152 mmol/L — ABNORMAL HIGH (ref 135–145)

## 2022-07-09 LAB — GLUCOSE, CAPILLARY
Glucose-Capillary: 189 mg/dL — ABNORMAL HIGH (ref 70–99)
Glucose-Capillary: 129 mg/dL — ABNORMAL HIGH (ref 70–99)
Glucose-Capillary: 124 mg/dL — ABNORMAL HIGH (ref 70–99)
Glucose-Capillary: 142 mg/dL — ABNORMAL HIGH (ref 70–99)
Glucose-Capillary: 120 mg/dL — ABNORMAL HIGH (ref 70–99)
Glucose-Capillary: 159 mg/dL — ABNORMAL HIGH (ref 70–99)

## 2022-07-09 LAB — CBC
HCT: 27 % — ABNORMAL LOW (ref 39.0–52.0)
Hemoglobin: 8.7 g/dL — ABNORMAL LOW (ref 13.0–17.0)
MCH: 33.6 pg (ref 26.0–34.0)
MCHC: 32.2 g/dL (ref 30.0–36.0)
MCV: 104.2 fL — ABNORMAL HIGH (ref 80.0–100.0)
Platelets: 264 10*3/uL (ref 150–400)
RBC: 2.59 MIL/uL — ABNORMAL LOW (ref 4.22–5.81)
RDW: 15.8 % — ABNORMAL HIGH (ref 11.5–15.5)
WBC: 10.4 10*3/uL (ref 4.0–10.5)
nRBC: 0 % (ref 0.0–0.2)

## 2022-07-09 LAB — APTT: aPTT: 63 seconds — ABNORMAL HIGH (ref 24–36)

## 2022-07-09 LAB — PROTIME-INR
INR: 1.2 (ref 0.8–1.2)
Prothrombin Time: 14.9 seconds (ref 11.4–15.2)

## 2022-07-09 LAB — HEPARIN LEVEL (UNFRACTIONATED)
Heparin Unfractionated: 0.14 IU/mL — ABNORMAL LOW (ref 0.30–0.70)
Heparin Unfractionated: 0.23 IU/mL — ABNORMAL LOW (ref 0.30–0.70)

## 2022-07-09 LAB — AMMONIA: Ammonia: 24 umol/L (ref 9–35)

## 2022-07-09 LAB — MAGNESIUM
Magnesium: 2.7 mg/dL — ABNORMAL HIGH (ref 1.7–2.4)
Magnesium: 2.7 mg/dL — ABNORMAL HIGH (ref 1.7–2.4)

## 2022-07-09 LAB — TROPONIN I (HIGH SENSITIVITY): Troponin I (High Sensitivity): 16 ng/L (ref <18)

## 2022-07-09 MED ORDER — ALBUMIN HUMAN 5 % IV SOLN
12.5000 g | Freq: Once | INTRAVENOUS | Status: AC
  Administered 2022-07-09: 12.5 g via INTRAVENOUS
  Filled 2022-07-09: qty 250

## 2022-07-09 MED ORDER — FREE WATER
200.0000 mL | Status: DC
  Administered 2022-07-09 – 2022-07-12 (×19): 200 mL

## 2022-07-09 NOTE — Progress Notes (Signed)
Dr Lamonte Sakai made aware of pt's blood in ETT and in back of oral cavity. Per Dr. Lamonte Sakai, hold heparin drip now and overnight. Will reassess heparin need tomorrow.

## 2022-07-09 NOTE — Progress Notes (Addendum)
ANTICOAGULATION CONSULT NOTE - Follow-up  Pharmacy Consult for heparin Indication: atrial fibrillation  No Known Allergies  Patient Measurements: Height: '5\' 10"'$  (177.8 cm) Weight: 77 kg (169 lb 12.1 oz) IBW/kg (Calculated) : 73 Heparin Dosing Weight: 77 kg  Vital Signs: Temp: 98 F (36.7 C) (09/30 0400) Temp Source: Axillary (09/30 0400) BP: 90/61 (09/30 0645) Pulse Rate: 111 (09/30 0645)  Labs: Recent Labs    07/07/22 0439 07/08/22 0454 07/08/22 0642 07/08/22 1112 07/09/22 0316  HGB 8.2* 7.3*  --   --  8.7*  HCT 25.6* 23.1*  --   --  27.0*  PLT 244 191  --   --  264  APTT 88* 78*  --   --  63*  HEPARINUNFRC >1.10* 0.41  --   --  0.14*  CREATININE 1.13 1.13 0.96 1.14 0.97     Estimated Creatinine Clearance: 66.9 mL/min (by C-G formula based on SCr of 0.97 mg/dL).   Medical History: Past Medical History:  Diagnosis Date   Chest pain    Diabetes mellitus    DM2 (diabetes mellitus, type 2) ([facility])    GERD (gastroesophageal reflux disease)    HLD (hyperlipidemia)    HTN (hypertension)    PAF (paroxysmal atrial fibrillation) ([facility])    Palpitations     Medications:  Scheduled:   Chlorhexidine Gluconate Cloth  6 each Topical Daily   feeding supplement (PROSource TF20)  60 mL Per Tube Daily   folic acid  1 mg Per Tube Daily   free water  200 mL Per Tube Q4H   insulin aspart  0-9 Units Subcutaneous Q4H   insulin aspart  5 Units Subcutaneous Q4H   lactulose  20 g Per Tube BID   midodrine  10 mg Per Tube Q8H   multivitamin with minerals  1 tablet Per Tube Daily   mupirocin cream   Topical Daily   mouth rinse  15 mL Mouth Rinse Q2H   pantoprazole  40 mg Per Tube Daily   rifaximin  550 mg Per Tube BID   spironolactone  100 mg Per Tube Daily   thiamine  100 mg Per Tube Daily    Assessment: 76 y/o male admitted for altered mental status and bilateral pleural effusions. PMH of atrial fibrillation on eliquis PTA (last dose morning of 9/26). Apixaban held in the  setting of increasing LFTs. Pharmacy was consulted to dose heparin.   Today's heparin level subtherapeutic at 0.14. aPTT is also subtherapeutic but not fully correlating with Anti-Xa. Anti-Xa and aPTT were correlating yesterday. Anticipate APTT is not accurate in the setting of liver dysfunction, would expect falsely elevated values. No pauses in heparin gtt or issues with lines per RN. Some bleeding in the back of patient's throat noted, but okay to continue heparin. HgB is stable at 8.7.   Goal of Therapy:  Heparin level 0.3-0.7 units/ml aPTT 66-102 seconds Monitor platelets by anticoagulation protocol: Yes   Plan:  Increase heparin gtt at 1300 units/hour, no bolus as patient is having some minor bleeding  Will stop checking aPTT levels as they were correlating yesterday but likely inaccurate in setting of liver dysfunction  Will check Anti-Xa level in 8 hours  Monitor daily CBC and s/sx bleeding  Esmeralda Arthur, PharmD  Clinical Pharmacist Please see AMION for all Pharmacists' Contact Phone Numbers 07/09/2022, 8:00 AM

## 2022-07-09 NOTE — Progress Notes (Signed)
Palliative Medicine Inpatient Follow Up Note HPI: 76 year old male with a past medical history of paroxysmal A-fib on Eliquis, left BKA, HTN, HLD, and alcoholic cirrhosis who presented to the hospital with altered mental status and was admitted on 06/16/2022. He was found to have a large right-sided pleural effusion and his intermittently increased since August 26.  Thoracentesis on 06/17/2022 with 2 L of yellow fluid was found to be transudative pleural effusion. Post procedure developed increased oxygen requirements and hypotension. 06/22/2022 PCCM consulted for evaluation and management of his right-sided pleural effusion and acute hypoxic respiratory failure.   Today's Discussion 07/09/2022  *Please note that this is a verbal dictation therefore any spelling or grammatical errors are due to the "[employer]" system interpretation.  Chart reviewed inclusive of vital signs, progress notes, laboratory results, and diagnostic images.   I met with patients RN, Denton Ar who shares that the precedex has continued to be weaned down.   On assessment, [place] is able to open his eyes though does not follow commands at this time meaningfully.   I spoke to Juddson's wife, Webb Silversmith and she shares that she has been mentally and emotionally preparing herself that Mehki will not be leaving the hospital. She expresses the hope for a miracle though the knowledge that this may not occur as she would like. We reviewed the multiple conditions that Iliya is contending with at this time inclusive of hypoxemic respiratory failure, decompensated liver cirrhosis, atrial fibrillation, and hypernatremia. We reviewed that the medical team is trying to optimize his metabolic derangements. Discussed with Webb Silversmith that the precedex has been weaned as well. We discussed that despite this, Ryker remains in a very tenuous state.   Created space and opportunity for Webb Silversmith to explore thoughts feelings and fears regarding current medical  situation. Webb Silversmith shares that she wants Smaran to be comfortable and not suffer though she would like to give him a little more time. We reviewed that if overtime he can't get extubated that he would never have wanted to be on life support indefinitely. We further discussed that if Aijalon is extubated that she would not desire for him to be re-intubated.   We discussed that if patient does not thrive or continues to worsen the idea of comfort oriented care. Webb Silversmith is aware of what this is and would be in agreement with keeping him comfortable if he fails to improve.   Questions and concerns addressed/Palliative Support Provided.   Objective Assessment: Vital Signs Vitals:   07/09/22 0700 07/09/22 0712  BP:  93/63  Pulse:  (!) 114  Resp:  17  Temp: (!) 97.4 F (36.3 C)   SpO2:  100%    Intake/Output Summary (Last 24 hours) at 07/09/2022 1000 Last data filed at 07/09/2022 0800 Gross per 24 hour  Intake 3450.24 ml  Output 1988 ml  Net 1462.24 ml   Last Weight  Most recent update: 07/09/2022  3:22 AM    Weight  77 kg (169 lb 12.1 oz)            Gen: Critically Ill  Elderly Caucasian M  HEENT: ETT, coretrac, dry mucous membranes CV: Irregular rate and rhythm  PULM: ON mechanical ventilator ABD: soft/nontender  EXT: LLE BKA Neuro:  Somnolent  SUMMARY OF RECOMMENDATIONS   DNAR   If/When patient is extubated no plan for re-intubation   Patient would not desire to live indefinitely on life supportive measures  Allow additional time for outcomes - if decompensates or does not  improve patients spouse is open to comfort care  Ongoing Palliative support  Time Spent:65  Billing based on MDM: High ______________________________________________________________________________________ [place] Team Team Cell Phone: [phone] Please utilize secure chat with additional questions, if there is no response within 30 minutes please call the above  phone number  Palliative Medicine Team providers are available by phone from 7am to 7pm daily and can be reached through the team cell phone.  Should this patient require assistance outside of these hours, please call the patient's attending physician.

## 2022-07-09 NOTE — Progress Notes (Signed)
ANTICOAGULATION CONSULT NOTE  Pharmacy Consult for heparin Indication: atrial fibrillation  No Known Allergies  Patient Measurements: Height: '5\' 10"'$  (177.8 cm) Weight: 77 kg (169 lb 12.1 oz) IBW/kg (Calculated) : 73 Heparin Dosing Weight: 77 kg  Vital Signs: Temp: 98.3 F (36.8 C) (09/30 1445) Temp Source: Axillary (09/30 1445) BP: 103/68 (09/30 1545) Pulse Rate: 122 (09/30 1545)  Labs: Recent Labs    07/07/22 0439 07/08/22 0454 07/08/22 0642 07/08/22 1112 07/09/22 0316 07/09/22 1337 07/09/22 1612  HGB 8.2* 7.3*  --   --  8.7*  --   --   HCT 25.6* 23.1*  --   --  27.0*  --   --   PLT 244 191  --   --  264  --   --   APTT 88* 78*  --   --  63*  --   --   HEPARINUNFRC >1.10* 0.41  --   --  0.14*  --  0.23*  CREATININE 1.13 1.13   < > 1.14 0.97 0.93  --    < > = values in this interval not displayed.     Estimated Creatinine Clearance: 69.8 mL/min (by C-G formula based on SCr of 0.93 mg/dL).  Assessment: 76 y/o male admitted for altered mental status and bilateral pleural effusions. PMH of atrial fibrillation on eliquis PTA (last dose morning of 9/26). Apixaban held in the setting of increasing LFTs. Pharmacy was consulted to dose heparin.   Today's heparin level subtherapeutic at 0.14. aPTT is also subtherapeutic but not fully correlating with Anti-Xa. Anti-Xa and aPTT were correlating yesterday. Anticipate APTT is not accurate in the setting of liver dysfunction, would expect falsely elevated values.  Heparin level remains subtherapeutic (0.23) on infusion at 1300 units/hr. Blod worsening in ETT so Dr. Lamonte Sakai holding heparin gtt for now (9/30 1715).  Goal of Therapy:  Heparin level 0.3-0.7 units/hr Monitor platelets by anticoagulation protocol: Yes   Plan:  F/u ability to restart heparin gtt Will d/c heparin levels for now  Sherlon Handing, PharmD, BCPS Please see amion for complete clinical pharmacist phone list 07/09/2022, 5:08 PM

## 2022-07-09 NOTE — Progress Notes (Signed)
eLink Physician-Brief Progress Note Patient Name: Darrell Horne DOB: [birth year] MRN: [id number]   Date of Service  07/09/2022  HPI/Events of Note  Pt with 10 beat run of VTach  eICU Interventions  Will further evaluate with BMP, Mg, repeat EKG.  Will continue to monitor closely.         [place] 07/09/2022, 8:46 PM

## 2022-07-09 NOTE — Progress Notes (Signed)
NAME:  Darrell Horne, MRN:  [id number], DOB:  03-22-1946, LOS: 63 ADMISSION DATE:  06/16/2022, CONSULTATION DATE:  06/22/2022 REFERRING MD:  Dr. Louanne Belton, CHIEF COMPLAINT:  Acute Hypoxic Respiratory Failure, Pleural Effusion   History of Present Illness:  Darrell Horne is a 76 year old male with a past medical history of paroxysmal A-fib on Eliquis, left BKA, HTN, HLD, and alcoholic cirrhosis who presented to the hospital with altered mental status and was initially admitted on 06/16/2022 with hepatic hydrothorax. PCCM was consulted for further evaluation and management of his right-sided pleural effusion and acute hypoxic respiratory failure. He is s/p thoracenteses and b/l chest tube placement. On 9/22, he developed  progressive confusion requiring intubation on 9/25 for airway protection.    Noted to have cirrhosis on imaging in 2015.  Right upper quadrant ultrasound this admission with cirrhotic liver but only trace ascites  Pertinent  Medical History    has a past medical history of Chest pain, Diabetes mellitus, DM2 (diabetes mellitus, type 2) ([place]), GERD (gastroesophageal reflux disease), HLD (hyperlipidemia), HTN (hypertension), PAF (paroxysmal atrial fibrillation) ([place]), and Palpitations.   has a past surgical history that includes Total ankle arthroplasty ([id number]); Cholecystectomy; Shoulder surgery ([id number]); Hernia repair; Appendectomy; Leg amputation (Left, [id number]); Colonoscopy; Colonoscopy with propofol (N/A, 11/06/2018); polypectomy (11/06/2018); and IR THORACENTESIS ASP PLEURAL SPACE W/IMG GUIDE (06/17/2022).    Significant Hospital Events: Including procedures, antibiotic start and stop dates in addition to other pertinent events   9/8 thoracentesis with return of 1.2 L of transudative fluid 06/18/2022: Stage II partial-thickness left medial buttock pressure injury noted low 9/12 increasing oxygen requirements with up to 6 L high flow nasal cannula and episodes of hypotension with  systolic of 89.  Albumin, midodrine, and tube feeds started.  Core track placed. 9/13 PCCM consulted, Eliquis held for planned thoracentesis tomorrow. CXR with stable pulm edema and pleural effusions 9/14 increased O2 requirements, stable on BiPAP now. Bilateral chest tubes placed with return of 6 L of yellow fluid. Off BiPAP 9/15 doing well off BiPAP overnight, on 5L [place], slowed drainage from bilat chest tubes, drastically improved peripheral edema.  9/16 off bipap. Remains off pressors. Stable to transfer out of ICU  9/19 - Lactulose held due to diarrhea . More responsive today. Bilateral chest tubes clamped since 9/18.  Transudative pleural effusion deemed due to cirrhosis and cardiac impairment.  Removal of chest tubes.  Oxygen level 96% on room air.  With diuresis he is -18 L.  Delirium thought to be slowly improving ammonia level 20.  Lactulose is being continued.  For A-fib oral amiodarone and oral metoprolol continued with Eliquis.  Being maintained on midodrine as well.   Critical care medicine signed off 06/29/2022: Continue diuresis.  Improved alertness noticed. 07/01/2022: Worsening confusion but also fluctuating confusion.  Continue diuresis. 07/02/2022: Lethargic worsening confusion.  Afebrile per hospitalist 07/03/2022: PCCM Consulted for confusion and respiratory distress, transferred to ICU 9/25: Intubated  9/26: Bilateral thoracentesis w/ 1200 cc's serosnanguinous fluid drained from right pleural space and 1500 ccs of yellowish fluid from left pleural space. Had large left-sided pneumothorax afterward with pigtail chest tube placed  Interim History / Subjective:  No events overnight. This AM remains on Vent support and Precedex   Objective   Blood pressure 90/61, pulse (!) 111, temperature 98 F (36.7 C), temperature source Axillary, resp. rate 19, height '5\' 10"'$  (1.778 m), weight 77 kg, SpO2 100 %. CVP:  [4 mmHg-8 mmHg] 8 mmHg  Vent Mode: PRVC FiO2 (%):  [  30 %] 30 % Set Rate:  [12  bmp-16 bmp] 12 bmp Vt Set:  [580 mL] 580 mL PEEP:  [5 cmH20] 5 cmH20 Plateau Pressure:  [13 cmH20-19 cmH20] 13 cmH20   Intake/Output Summary (Last 24 hours) at 07/09/2022 0724 Last data filed at 07/09/2022 0600 Gross per 24 hour  Intake 3635.69 ml  Output 2280 ml  Net 1355.69 ml   Filed Weights   07/07/22 0346 07/08/22 0500 07/09/22 0322  Weight: 77 kg 76.2 kg 77 kg    Physical exam: General: Crtitically ill-appearing male, orally intubated HEENT:  ETT and OGT in place Neuro: Does not respond to voice, not following commands, shakes head intermittently not in response to vocal stimuli Chest: Coarse breath sounds. Left pigtail catheter in place without air leak. Heart: RRR, no murmurs or gallops Abdomen: Soft, slightly distended, bowel sounds present Skin: No rash  Resolved Hospital Problem list     Assessment & Plan:   Multifactorial acute hypoxemic respiratory failure d/t encephalopathy in setting of liver cirrhosis Superimposed pneumonia with ESBL producing Klebsiella  Acute on chronic b/l pleural effusions, Left Pneumothroax  Improving leukocytosis but febrile x1 overnight. Continues to remain non-responsive to voice, intubated and sedated. Weaned entirely off of pressors. Goal to continue to decrease sedation, SBT and evaluate if extubation will be successful with further GOC discussions with family regarding formal withdrawal of care vs. Extubation without plans to reintubate. Plan - Continue Meropenem (9/27)  - Continue lung protective ventilation - Avoid deep sedation; RASS goal 0/-1 - Wean precedex - Continue Chest Tube to suction while remains intubated   Hypotension, multifactorial in setting of sedation, infectious, decompensated liver cirrhosis Plan - Cardiac Monitoring - Continue Midodrine 10 mg q8h  - Titrate Levophed for MAP goal >33, Systolic >35   Portal Hypertension Decompensated alcoholic liver cirrhosis, ascites, pleural effusion, volume  overload Coagulopathy Have been reducing diuresis regimen and balancing BP with volume. Adequate UOP at 1.58L in past 24 hours on BID Lasix. Stable renal function. Still encephalopathic. Plan - Hold lasix today  - Lactulose to 20 mg BID - Continue rifaximin for HE - Continue spironolactone - Follow I/Os  Paroxysmal Afib, with RVR Prolonged Qtc CHADSVASC score 4 Plan - Hold Eliquis for CVA ppx given increase LFTs - Continue heparin gtt - Repeat EKG to evaluate QTC   Anemia of chronic disease Plan - Trend CBC   Hypernatremia Na continues to rise, corrected 154 this morning Plan - Increase free water flushes to 200 mL q4h per tube - Hold lasix today - BMP at 1400   GERD Plan - PPI  Hypokalemia Plan Trend BMP Replace as indicated. This AM 4.2   Malnutrition -severe protein calorie Hyperglycemia, likely stress/tube feed related Plan - Continue tube feeds - 4 units lispro Q4h; goal CBG 140-180  Pressure injury stage II left medial buttock - Present on Admit Continue wound care  Best Practice (right click and "Reselect all SmartList Selections" daily)   Diet/type: Tube feeds DVT prophylaxis: LMWH  GI prophylaxis: PPI Lines: Yes needed Foley:  Yes, and it is still needed Code Status:  full code Last date of multidisciplinary goals of care discussion [9/27 with palliative medicine- patient made DNR]  LABS    PULMONARY Recent Labs  Lab 07/02/22 1826 07/03/22 0850 07/03/22 2311 07/03/22 2320 07/04/22 1021  PHART  --  7.44 7.815* 7.424 7.385  PCO2ART  --  36 <15.0* 34.8 41.4  PO2ART  --  166* 17* 92 448*  HCO3 27.9  24.2 22.2 22.7 25.0  TCO2  --   --  '23 24 26  '$ O2SAT 50.4 99.1 97 97 100    CBC Recent Labs  Lab 07/07/22 0439 07/08/22 0454 07/09/22 0316  HGB 8.2* 7.3* 8.7*  HCT 25.6* 23.1* 27.0*  WBC 13.7* 7.2 10.4  PLT 244 191 264    COAGULATION Recent Labs  Lab 07/03/22 2054 07/04/22 0130  INR 2.1* 2.1*    CARDIAC  No results for  input(s): "TROPONINI" in the last 168 hours. No results for input(s): "PROBNP" in the last 168 hours.   CHEMISTRY Recent Labs  Lab 07/03/22 0056 07/03/22 2045 07/03/22 2311 07/04/22 0130 07/04/22 1021 07/05/22 0500 07/06/22 0332 07/07/22 0439 07/08/22 0454 07/08/22 0642 07/08/22 1112 07/09/22 0316  NA 137 136   < > 138   < > 138 147* 148* 151* 150* 153* 154*  K 4.3 4.3   < > 3.9   < > 3.0* 3.3* 3.4* 2.8* 6.9* 4.4 4.2  CL 105 106  --  106  --  112* 116* 114* 126* 125* 122* 125*  CO2 23 20*  --  23  --  '23 22 22 22 25 24 24  '$ GLUCOSE 165* 205*  --  133*  --  210* 237* 214* 206* 170* 159* 198*  BUN 40* 53*  --  56*  --  61* 55* 62* 60* 57* 64* 61*  CREATININE 0.89 1.09  --  1.05  --  1.05 1.09 1.13 1.13 0.96 1.14 0.97  CALCIUM 9.2 9.0  --  9.3  --  8.4* 8.7* 8.6* 7.8* 7.4* 8.6* 8.6*  MG 2.3 2.4  --  2.5*  --  2.4 2.3 2.4 2.4  --   --  2.7*  PHOS 3.1 3.6  --  3.4  --   --   --   --   --   --   --   --    < > = values in this interval not displayed.   Estimated Creatinine Clearance: 66.9 mL/min (by C-G formula based on SCr of 0.97 mg/dL).   LIVER Recent Labs  Lab 07/03/22 2045 07/03/22 2054 07/04/22 0130 07/05/22 0500 07/06/22 1000 07/08/22 0454  AST 97*  --  107* 156* 208* 165*  ALT 79*  --  81* 118* 165* 171*  ALKPHOS 213*  --  201* 255* 255* 200*  BILITOT 1.9*  --  1.8* 2.5* 1.9* 0.9  PROT 7.1  --  5.9* 5.9* 5.6* 5.2*  ALBUMIN 2.7*  --  2.6* 2.2* 1.9* 1.6*  INR  --  2.1* 2.1*  --   --   --      INFECTIOUS Recent Labs  Lab 07/03/22 2054 07/04/22 0130 07/05/22 0500  LATICACIDVEN 1.7 1.1  --   PROCALCITON  --  1.13 1.57     ENDOCRINE CBG (last 3)  Recent Labs    07/08/22 1941 07/08/22 2313 07/09/22 0321  GLUCAP 130* 117* 189*   CRITICAL CARE Performed by: Omar Person   Total critical care time: 45 minutes  Critical care time was exclusive of separately billable procedures and treating other patients.  Critical care was necessary to  treat or prevent imminent or life-threatening deterioration.  Critical care was time spent personally by me on the following activities: development of treatment plan with patient and/or surrogate as well as nursing, discussions with consultants, evaluation of patient's response to treatment, examination of patient, obtaining history from patient or surrogate, ordering and performing treatments and  interventions, ordering and review of laboratory studies, ordering and review of radiographic studies, pulse oximetry and re-evaluation of patient's condition.

## 2022-07-09 NOTE — Progress Notes (Signed)
[facility] Progress Note Patient Name: Darrell Horne DOB: 10/29/1945 MRN: [id number]   Date of Service  07/09/2022  HPI/Events of Note  Nursing concern for suctioning 2 "medium sized" clots from back of throat. Patient in a Heparin IV infusion. Hgb = 8.7 and Platelets = 261. No active bleeding noted by nurse.   eICU Interventions  Continue to monitor and continue present management.     Intervention Category Major Interventions: Other:  Lysle Dingwall 07/09/2022, 5:39 AM

## 2022-07-10 ENCOUNTER — Inpatient Hospital Stay (HOSPITAL_COMMUNITY): Payer: Medicare Other

## 2022-07-10 DIAGNOSIS — J9601 Acute respiratory failure with hypoxia: Secondary | ICD-10-CM | POA: Diagnosis not present

## 2022-07-10 DIAGNOSIS — Z7189 Other specified counseling: Secondary | ICD-10-CM | POA: Diagnosis not present

## 2022-07-10 DIAGNOSIS — G9340 Encephalopathy, unspecified: Secondary | ICD-10-CM | POA: Diagnosis not present

## 2022-07-10 DIAGNOSIS — Z515 Encounter for palliative care: Secondary | ICD-10-CM | POA: Diagnosis not present

## 2022-07-10 LAB — CBC
HCT: 25 % — ABNORMAL LOW (ref 39.0–52.0)
Hemoglobin: 7.8 g/dL — ABNORMAL LOW (ref 13.0–17.0)
MCH: 33.1 pg (ref 26.0–34.0)
MCHC: 31.2 g/dL (ref 30.0–36.0)
MCV: 105.9 fL — ABNORMAL HIGH (ref 80.0–100.0)
Platelets: 219 10*3/uL (ref 150–400)
RBC: 2.36 MIL/uL — ABNORMAL LOW (ref 4.22–5.81)
RDW: 16 % — ABNORMAL HIGH (ref 11.5–15.5)
WBC: 7.7 10*3/uL (ref 4.0–10.5)
nRBC: 0 % (ref 0.0–0.2)

## 2022-07-10 LAB — PHOSPHORUS: Phosphorus: 2.6 mg/dL (ref 2.5–4.6)

## 2022-07-10 LAB — HEPARIN LEVEL (UNFRACTIONATED): Heparin Unfractionated: 0.12 IU/mL — ABNORMAL LOW (ref 0.30–0.70)

## 2022-07-10 LAB — GLUCOSE, CAPILLARY
Glucose-Capillary: 127 mg/dL — ABNORMAL HIGH (ref 70–99)
Glucose-Capillary: 154 mg/dL — ABNORMAL HIGH (ref 70–99)
Glucose-Capillary: 144 mg/dL — ABNORMAL HIGH (ref 70–99)
Glucose-Capillary: 135 mg/dL — ABNORMAL HIGH (ref 70–99)
Glucose-Capillary: 132 mg/dL — ABNORMAL HIGH (ref 70–99)
Glucose-Capillary: 116 mg/dL — ABNORMAL HIGH (ref 70–99)

## 2022-07-10 LAB — MAGNESIUM: Magnesium: 2.8 mg/dL — ABNORMAL HIGH (ref 1.7–2.4)

## 2022-07-10 MED ORDER — FUROSEMIDE 10 MG/ML IJ SOLN
20.0000 mg | Freq: Once | INTRAMUSCULAR | Status: AC
  Administered 2022-07-10: 20 mg via INTRAVENOUS
  Filled 2022-07-10: qty 2

## 2022-07-10 MED ORDER — HEPARIN (PORCINE) 25000 UT/250ML-% IV SOLN
1600.0000 [IU]/h | INTRAVENOUS | Status: DC
  Administered 2022-07-10: 1300 [IU]/h via INTRAVENOUS
  Administered 2022-07-11 – 2022-07-12 (×2): 1600 [IU]/h via INTRAVENOUS
  Filled 2022-07-10 (×3): qty 250

## 2022-07-10 MED ORDER — MIDODRINE HCL 5 MG PO TABS
15.0000 mg | ORAL_TABLET | Freq: Three times a day (TID) | ORAL | Status: DC
  Administered 2022-07-10 – 2022-07-12 (×6): 15 mg
  Filled 2022-07-10 (×7): qty 3

## 2022-07-10 MED ORDER — AMIODARONE HCL IN DEXTROSE 360-4.14 MG/200ML-% IV SOLN
30.0000 mg/h | INTRAVENOUS | Status: DC
  Administered 2022-07-10 – 2022-07-12 (×4): 30 mg/h via INTRAVENOUS
  Filled 2022-07-10 (×5): qty 200

## 2022-07-10 MED ORDER — POTASSIUM CHLORIDE 20 MEQ PO PACK
20.0000 meq | PACK | Freq: Once | ORAL | Status: AC
  Administered 2022-07-10: 20 meq
  Filled 2022-07-10: qty 1

## 2022-07-10 MED ORDER — DIGOXIN 0.25 MG/ML IJ SOLN
0.2500 mg | Freq: Four times a day (QID) | INTRAMUSCULAR | Status: AC
  Administered 2022-07-10 – 2022-07-11 (×2): 0.25 mg via INTRAVENOUS
  Filled 2022-07-10 (×2): qty 2

## 2022-07-10 MED ORDER — AMIODARONE LOAD VIA INFUSION
150.0000 mg | Freq: Once | INTRAVENOUS | Status: AC
  Administered 2022-07-10: 150 mg via INTRAVENOUS
  Filled 2022-07-10: qty 83.34

## 2022-07-10 MED ORDER — AMIODARONE LOAD VIA INFUSION
150.0000 mg | Freq: Once | INTRAVENOUS | Status: AC
  Administered 2022-07-10: 150 mg via INTRAVENOUS

## 2022-07-10 MED ORDER — PHENYLEPHRINE HCL-NACL 20-0.9 MG/250ML-% IV SOLN
0.0000 ug/min | INTRAVENOUS | Status: DC
  Administered 2022-07-10: 50 ug/min via INTRAVENOUS
  Filled 2022-07-10: qty 250

## 2022-07-10 MED ORDER — AMIODARONE HCL IN DEXTROSE 360-4.14 MG/200ML-% IV SOLN
60.0000 mg/h | INTRAVENOUS | Status: DC
  Administered 2022-07-10 (×3): 60 mg/h via INTRAVENOUS
  Filled 2022-07-10 (×2): qty 200

## 2022-07-10 MED ORDER — METOPROLOL TARTRATE 5 MG/5ML IV SOLN
2.5000 mg | Freq: Once | INTRAVENOUS | Status: AC
  Administered 2022-07-10: 2.5 mg via INTRAVENOUS
  Filled 2022-07-10: qty 5

## 2022-07-10 MED ORDER — DIGOXIN 0.25 MG/ML IJ SOLN
0.5000 mg | Freq: Once | INTRAMUSCULAR | Status: AC
  Administered 2022-07-10: 0.5 mg via INTRAVENOUS
  Filled 2022-07-10: qty 2

## 2022-07-10 MED ORDER — AMIODARONE HCL 200 MG PO TABS
200.0000 mg | ORAL_TABLET | Freq: Two times a day (BID) | ORAL | Status: DC
  Administered 2022-07-10: 200 mg via ORAL
  Filled 2022-07-10: qty 1

## 2022-07-10 NOTE — Progress Notes (Signed)
   Palliative Medicine Inpatient Follow Up Note HPI: 76 year old male with a past medical history of paroxysmal A-fib on Eliquis, left BKA, HTN, HLD, and alcoholic cirrhosis who presented to the hospital with altered mental status and was admitted on 06/16/2022. He was found to have a large right-sided pleural effusion and his intermittently increased since August 26.  Thoracentesis on 06/17/2022 with 2 L of yellow fluid was found to be transudative pleural effusion. Post procedure developed increased oxygen requirements and hypotension. 06/22/2022 PCCM consulted for evaluation and management of his right-sided pleural effusion and acute hypoxic respiratory failure.   Today's Discussion 07/10/2022  *Please note that this is a verbal dictation therefore any spelling or grammatical errors are due to the "[employer]" system interpretation.  Chart reviewed inclusive of vital signs, progress notes, laboratory results, and diagnostic images.   Per note review patient has been in and out of Vtach throughout the night. Nursing shares that he had a very prolonged episode.   Myself and Darrell Horne,  met with patients spouse Darrell Horne at bedside. She is very emotional this afternoon. She shares that she does not desire to talk about patients condition at this time. She goes onto say that he was doing well though has now taken a turn for the worst.   We allowed some time to pass at bedside observing patients emotions through therapeutic silence.   I shared we would allow her time with Darrell Horne and perhaps we could reconvene tomorrow.   Questions and concerns addressed/Palliative Support Provided.   Objective Assessment: Vital Signs Vitals:   07/10/22 1100 07/10/22 1200  BP: (!) 100/58 (!) 91/59  Pulse: (!) 117 (!) 116  Resp: (!) 24 19  Temp:    SpO2: 100% 100%    Intake/Output Summary (Last 24 hours) at 07/10/2022 1312 Last data filed at 07/10/2022 1200 Gross per 24 hour  Intake 4026.25 ml  Output  2175 ml  Net 1851.25 ml    Last Weight  Most recent update: 07/10/2022  2:14 AM    Weight  78.8 kg (173 lb 11.6 oz)            Gen: Critically Ill  Elderly Caucasian M  HEENT: ETT, coretrac, dry mucous membranes CV: Irregular rate and rhythm  PULM: ON mechanical ventilator ABD: soft/nontender  EXT: LLE BKA Neuro:  Somnolent  SUMMARY OF RECOMMENDATIONS   DNAR   If/When patient is extubated no plan for re-intubation   Patient has decline further patients spouse not willing to discuss this today --> Plan to reconvene tomorrow  Allow additional time for outcomes - if decompensates or does not improve patients spouse is open to comfort care  Ongoing Palliative support  Billing based on MDM: High ______________________________________________________________________________________ Darrell Horne [facility] Cell Phone: [phone] Please utilize secure chat with additional questions, if there is no response within 30 minutes please call the above phone number  Palliative Medicine [facility] providers are available by phone from 7am to 7pm daily and can be reached through the [facility] cell phone.  Should this patient require assistance outside of these hours, please call the patient's attending physician.

## 2022-07-10 NOTE — Progress Notes (Signed)
Interval Note  Patient having more frequent runs of Vtach.   Electrolytes are at goal. Home dose amiodarone restarted this am but will switch to IV amiodarone with bolus and monitor closely.  Also flipped back to full support on MV to avoid further stress.          Kennieth Rad, MSN, AG-ACNP-BC [facility] 07/10/2022, 11:30 AM  See Amion for pager If no response to pager, please call PCCM consult pager After 7:00 pm call Elink

## 2022-07-10 NOTE — Progress Notes (Signed)
ANTICOAGULATION CONSULT NOTE  Pharmacy Consult for heparin Indication: atrial fibrillation  No Known Allergies  Patient Measurements: Height: '5\' 10"'$  (177.8 cm) Weight: 78.8 kg (173 lb 11.6 oz) IBW/kg (Calculated) : 73 Heparin Dosing Weight: 77 kg  Vital Signs: Temp: 98.2 F (36.8 C) (10/01 0801) Temp Source: Oral (10/01 0801) BP: 82/49 (10/01 0845) Pulse Rate: 111 (10/01 0845)  Labs: Recent Labs    07/08/22 0454 07/08/22 1914 07/09/22 0316 07/09/22 1337 07/09/22 1612 07/09/22 1844 07/09/22 2049 07/10/22 0217  HGB 7.3*  --  8.7*  --   --   --   --  7.8*  HCT 23.1*  --  27.0*  --   --   --   --  25.0*  PLT 191  --  264  --   --   --   --  219  APTT 78*  --  63*  --   --   --   --   --   LABPROT  --   --   --   --   --  14.9  --   --   INR  --   --   --   --   --  1.2  --   --   HEPARINUNFRC 0.41  --  0.14*  --  0.23*  --   --   --   CREATININE 1.13   < > 0.97 0.93  --   --  0.90 0.88  TROPONINIHS  --   --   --   --   --   --  16  --    < > = values in this interval not displayed.     Estimated Creatinine Clearance: 73.7 mL/min (by C-G formula based on SCr of 0.88 mg/dL).  Assessment: 76 y/o male admitted for altered mental status and bilateral pleural effusions. PMH of atrial fibrillation on eliquis PTA (last dose morning of 9/26). Apixaban held in the setting of increasing LFTs. Pharmacy was consulted to dose heparin.   Heparin level while on 1100unit/hr subtherapeutic at 0.14 on 9/30. Increased to 1300u/hr at 08:19 was then discontinued at 17:19 due to blood in ET tube. Heparin level collected at 16:12 on 9/30 (heparin running at 1300u/hr) was subtherapeutic at 0.23. Bleeding is improved this morning, hemoglobin is 7.8 down from 8.7.   Goal of Therapy:  Heparin level 0.3-0.7 units/hr Monitor platelets by anticoagulation protocol: Yes   Plan:  Restart heparin per CCM  Will restart at 1300u/hr, although appears patient was subtherapeutic on 1300u/hr yesterday  will be cautious in the setting of bleed. Additionally, patient was also therapeutic for several days on 1100u/hr.  Check anti-Xa level in 8 hours.  Monitor H & H daily and S/SX of bleeding.   Esmeralda Arthur, PharmD  Please see amion for complete clinical pharmacist phone list 07/10/2022, 9:42 AM
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
## 2022-07-10 NOTE — Progress Notes (Signed)
PCCM Interval Note   Patient remains in afib with rates < 120 but continues to have episodes of wide complex arrythmias, initially thought to be vtach, therefore was started back on amiodarone gtt with bolus this am.  Since, frequency of wide complexes have increased despite placing pt back on full MV support, restarting low dose precedex, and 2nd bolus of amiodarone.    EKG obtained which shows afib with RVR with premature ventricular or aberrantly conducted complexes.  Therefore, reached out to cardiology who had seen the patient early in hospitalization for afib.  Felt that wide complexes likely rate related and slowing HR would help.    P:  Remains off pressors but MAP's marginal If vasopressor support needed, use Neo instead of NE Lopressor 2.'5mg'$  IV x 1 given marginal BP.  Can redose if MAPs stable Renal function good, therefore will load with digoxin 0.'5mg'$  f/b 0.'25mg'$  q6hr x 2 Ensure K> 4, Mag>2 Discussed with Dr. Lamonte Sakai  CCT 20 mins      Kennieth Rad, MSN, AG-ACNP-BC [facility] 07/10/2022, 4:28 PM  See Amion for pager If no response to pager, please call PCCM consult pager After 7:00 pm call Elink

## 2022-07-10 NOTE — Progress Notes (Signed)
Pt having ongoing runs of vtach throughout the day. Received orders this morning after significant runs to return patient to full vent support and start amio gtt and bolus. Post bolus pt had less episodes but still occurred 3-4 times per hour. Now pt back in and out every few minutes with runs as high as 50 beats. CCM made aware, other VSS at this time. Pt is awake and following commands somewhat.

## 2022-07-10 NOTE — Progress Notes (Signed)
ANTICOAGULATION CONSULT NOTE  Pharmacy Consult for heparin Indication: atrial fibrillation  No Known Allergies  Patient Measurements: Height: '5\' 10"'$  (177.8 cm) Weight: 78.8 kg (173 lb 11.6 oz) IBW/kg (Calculated) : 73 Heparin Dosing Weight: 77 kg  Vital Signs: Temp: 98.6 F (37 C) (10/01 1543) Temp Source: Oral (10/01 1543) BP: 83/55 (10/01 1815) Pulse Rate: 94 (10/01 1815)  Labs: Recent Labs    07/08/22 0454 07/08/22 7829 07/09/22 0316 07/09/22 1337 07/09/22 1612 07/09/22 1844 07/09/22 2049 07/10/22 0217 07/10/22 1733  HGB 7.3*  --  8.7*  --   --   --   --  7.8*  --   HCT 23.1*  --  27.0*  --   --   --   --  25.0*  --   PLT 191  --  264  --   --   --   --  219  --   APTT 78*  --  63*  --   --   --   --   --   --   LABPROT  --   --   --   --   --  14.9  --   --   --   INR  --   --   --   --   --  1.2  --   --   --   HEPARINUNFRC 0.41  --  0.14*  --  0.23*  --   --   --  0.12*  CREATININE 1.13   < > 0.97 0.93  --   --  0.90 0.88  --   TROPONINIHS  --   --   --   --   --   --  16  --   --    < > = values in this interval not displayed.     Estimated Creatinine Clearance: 73.7 mL/min (by C-G formula based on SCr of 0.88 mg/dL).  Assessment: 76 y/o male admitted for altered mental status and bilateral pleural effusions. PMH of atrial fibrillation on eliquis PTA (last dose morning of 9/26). Apixaban held in the setting of increasing LFTs. Pharmacy was consulted to dose heparin. Noted heparin held 9/30 for blood in ET tube.  Heparin level remains subtherapeutic (0.12) on infusion at 1300 units/hr. No issues with line per RN. No bleeding from ET tube today.  Goal of Therapy:  Heparin level 0.3-0.7 units/hr Monitor platelets by anticoagulation protocol: Yes   Plan:  Increase heparin to 1450 units/hr F/u 8 hr heparin level  Sherlon Handing, PharmD, BCPS Please see amion for complete clinical pharmacist phone list 07/10/2022, 6:58 PM

## 2022-07-10 NOTE — Progress Notes (Signed)
NAME:  Darrell Horne, MRN:  [id number], DOB:  1946/08/14, LOS: 24 ADMISSION DATE:  06/16/2022, CONSULTATION DATE:  06/22/2022 REFERRING MD:  Dr. Louanne Belton, CHIEF COMPLAINT:  Acute Hypoxic Respiratory Failure, Pleural Effusion   History of Present Illness:  Darrell Horne is a 76 year old male with a past medical history of paroxysmal A-fib on Eliquis, left BKA, HTN, HLD, and alcoholic cirrhosis who presented to the hospital with altered mental status and was initially admitted on 06/16/2022 with hepatic hydrothorax. PCCM was consulted for further evaluation and management of his right-sided pleural effusion and acute hypoxic respiratory failure. He is s/p thoracenteses and b/l chest tube placement. On 9/22, he developed  progressive confusion requiring intubation on 9/25 for airway protection.    Noted to have cirrhosis on imaging in 2015.  Right upper quadrant ultrasound this admission with cirrhotic liver but only trace ascites  Pertinent  Medical History    has a past medical history of Chest pain, Diabetes mellitus, DM2 (diabetes mellitus, type 2) ([place]), GERD (gastroesophageal reflux disease), HLD (hyperlipidemia), HTN (hypertension), PAF (paroxysmal atrial fibrillation) ([place]), and Palpitations.   has a past surgical history that includes Total ankle arthroplasty ([id number]); Cholecystectomy; Shoulder surgery ([id number]); Hernia repair; Appendectomy; Leg amputation (Left, [id number]); Colonoscopy; Colonoscopy with propofol (N/A, 11/06/2018); polypectomy (11/06/2018); and IR THORACENTESIS ASP PLEURAL SPACE W/IMG GUIDE (06/17/2022).  Significant Hospital Events: Including procedures, antibiotic start and stop dates in addition to other pertinent events   9/8 thoracentesis with return of 1.2 L of transudative fluid 06/18/2022: Stage II partial-thickness left medial buttock pressure injury noted low 9/12 increasing oxygen requirements with up to 6 L high flow nasal cannula and episodes of hypotension with systolic  of 89.  Albumin, midodrine, and tube feeds started.  Core track placed. 9/13 PCCM consulted, Eliquis held for planned thoracentesis tomorrow. CXR with stable pulm edema and pleural effusions 9/14 increased O2 requirements, stable on BiPAP now. Bilateral chest tubes placed with return of 6 L of yellow fluid. Off BiPAP 9/15 doing well off BiPAP overnight, on 5L , slowed drainage from bilat chest tubes, drastically improved peripheral edema.  9/16 off bipap. Remains off pressors. Stable to transfer out of ICU  9/19 - Lactulose held due to diarrhea . More responsive today. Bilateral chest tubes clamped since 9/18.  Transudative pleural effusion deemed due to cirrhosis and cardiac impairment.  Removal of chest tubes.  Oxygen level 96% on room air.  With diuresis he is -18 L.  Delirium thought to be slowly improving ammonia level 20.  Lactulose is being continued.  For A-fib oral amiodarone and oral metoprolol continued with Eliquis.  Being maintained on midodrine as well.   Critical care medicine signed off 06/29/2022: Continue diuresis.  Improved alertness noticed. 07/01/2022: Worsening confusion but also fluctuating confusion.  Continue diuresis. 07/02/2022: Lethargic worsening confusion.  Afebrile per hospitalist 07/03/2022: PCCM Consulted for confusion and respiratory distress, transferred to ICU 9/25: Intubated  9/26: Bilateral thoracentesis w/ 1200 cc's serosnanguinous fluid drained from right pleural space and 1500 ccs of yellowish fluid from left pleural space. Had large left-sided pneumothorax afterward with pigtail chest tube placed 9/30 weaning precedex, back on low dose NE, bloody ETT secretions> heparin held  Interim History / Subjective:  Bloody secretions in ETT yest evening> heparin held> since no further issues  10 beat run of Vtach overnight Off precedex this am  Still needing 1-2 mcg of NE  Objective   Blood pressure 98/63, pulse (!) 114, temperature 98.2 F (36.8 C), temperature  source Oral, resp. rate 16, height '5\' 10"'$  (1.778 m), weight 78.8 kg, SpO2 100 %. CVP:  [5 mmHg-8 mmHg] 6 mmHg  Vent Mode: PRVC FiO2 (%):  [30 %] 30 % Set Rate:  [12 bmp] 12 bmp Vt Set:  [580 mL] 580 mL PEEP:  [5 cmH20] 5 cmH20 Pressure Support:  [8 cmH20] 8 cmH20 Plateau Pressure:  [14 cmH20-18 cmH20] 17 cmH20   Intake/Output Summary (Last 24 hours) at 07/10/2022 0844 Last data filed at 07/10/2022 0600 Gross per 24 hour  Intake 4033.16 ml  Output 2705 ml  Net 1328.16 ml   Filed Weights   07/08/22 0500 07/09/22 0322 07/10/22 0214  Weight: 76.2 kg 77 kg 78.8 kg    Physical exam: General:  chronically ill appearing older male lying in bed in NAD HEENT: MM pink/moist, edentulous, ETT, cortrak, pupils 4/reactive Neuro: eyes open to verbal, but not f/c, MAE CV: irir, no murmur> afib rate ~110 PULM:  non labored on PSV 8/5 with good volumes, lungs coarse, left lateral pigtail to sxn, no airleak, minimal serosang drainage GI: obese, soft, bs+, foley- cyu, FMS Extremities: warm/dry, generalized edema +1, L BKA  Afebrile UOP 1.4L/ 24 Stool 1.1L/ 24 CT 98m /24 +1.5L/ 24 Net +4.6L  Labs reviewed > Na 152> 148, sCr 0.88, mag 2.8, K 4, Hgb stable 7.8  Resolved Hospital Problem list     Assessment & Plan:   Multifactorial acute hypoxemic respiratory failure d/t encephalopathy in setting of liver cirrhosis Superimposed pneumonia with ESBL producing Klebsiella  Acute on chronic b/l pleural effusions, Left Pneumothroax  Plan - Continue Meropenem (9/27), day 5/x - cont full MV support with daily SBT trials.  Mental status has been barrier to extubation.  Currently off precedex this morning, more awake but still not following commands.  No further ETT bleeding noted overnight, Hgb remains stable.  - plans to optimize patient with plans for one way extubation vs transition to comfort based care if unable - cont CT while on MV - diurese as able  - BD - ongoing discussions w/ family,  appreciate PMT assistance   Hypotension, multifactorial in setting of sedation, infectious, decompensated liver cirrhosis Plan - Cardiac Monitoring - increase Midodrine 10> 15 mg q8h  - Titrate Levophed for MAP goal >>62 Systolic >>37  Portal Hypertension Decompensated alcoholic liver cirrhosis, ascites, pleural effusion, volume overload Coagulopathy Plan - Na better today, will start gentle diureses - Lactulose to 20 mg BID - Continue rifaximin for HE - Continue spironolactone - Follow I/Os - repeat LFTs in am   Paroxysmal Afib, with RVR Prolonged Qtc CHADSVASC score 4 Vtach Plan - Hold Eliquis for CVA ppx given increase LFTs - restart heparin gtt and monitor closely for bleeding - Qtc 0.477 last night, will add back amiodarone to help with HR/ arrhythmia  - monitor Qtc - goal K> 4, Mag> 2   Anemia of chronic disease Plan - H/H remains stable  - no further evidence of ETT bleeding - restart heparin and monitor closely   Hypernatremia Plan - Na improved  - cont FWF 200 mL q4h per tube - gentle diuresis  - trend BMET   GERD Plan - PPI  Hypokalemia Plan - keep > 4 - Trend BMP/ replete electrolytes as needed   Malnutrition -severe protein calorie Hyperglycemia, likely stress/tube feed related Plan - Continue tube feeds - cont SSI sensitive, TF coverage 5 units q 4, goal CBG 140-180  Pressure injury stage II left medial buttock - Present  on Admit - Continue wound care  Best Practice (right click and "Reselect all SmartList Selections" daily)   Diet/type: Tube feeds DVT prophylaxis: LMWH  GI prophylaxis: PPI Lines: Yes needed Foley:  Yes, and it is still needed Code Status:  full code Last date of multidisciplinary goals of care discussion [9/27 with palliative medicine- patient made DNR]  LABS    PULMONARY Recent Labs  Lab 07/03/22 0850 07/03/22 2311 07/03/22 2320 07/04/22 1021  PHART 7.44 7.815* 7.424 7.385  PCO2ART 36 <15.0* 34.8 41.4   PO2ART 166* 17* 92 448*  HCO3 24.2 22.2 22.7 25.0  TCO2  --  '23 24 26  '$ O2SAT 99.1 97 97 100    CBC Recent Labs  Lab 07/08/22 0454 07/09/22 0316 07/10/22 0217  HGB 7.3* 8.7* 7.8*  HCT 23.1* 27.0* 25.0*  WBC 7.2 10.4 7.7  PLT 191 264 219    COAGULATION Recent Labs  Lab 07/03/22 2054 07/04/22 0130 07/09/22 1844  INR 2.1* 2.1* 1.2    CARDIAC  No results for input(s): "TROPONINI" in the last 168 hours. No results for input(s): "PROBNP" in the last 168 hours.   CHEMISTRY Recent Labs  Lab 07/03/22 2045 07/03/22 2311 07/04/22 0130 07/04/22 1021 07/07/22 0439 07/08/22 0454 07/08/22 0642 07/08/22 1112 07/09/22 0316 07/09/22 1337 07/09/22 2049 07/10/22 0217  NA 136   < > 138   < > 148* 151*   < > 153* 154* 151* 152* 148*  K 4.3   < > 3.9   < > 3.4* 2.8*   < > 4.4 4.2 4.4 4.2 4.0  CL 106  --  106   < > 114* 126*   < > 122* 125* 122* 125* 122*  CO2 20*  --  23   < > 22 22   < > '24 24 24 24 24  '$ GLUCOSE 205*  --  133*   < > 214* 206*   < > 159* 198* 173* 186* 173*  BUN 53*  --  56*   < > 62* 60*   < > 64* 61* 58* 50* 48*  CREATININE 1.09  --  1.05   < > 1.13 1.13   < > 1.14 0.97 0.93 0.90 0.88  CALCIUM 9.0  --  9.3   < > 8.6* 7.8*   < > 8.6* 8.6* 8.7* 8.6* 8.5*  MG 2.4  --  2.5*   < > 2.4 2.4  --   --  2.7*  --  2.7* 2.8*  PHOS 3.6  --  3.4  --   --   --   --   --   --   --   --  2.6   < > = values in this interval not displayed.   Estimated Creatinine Clearance: 73.7 mL/min (by C-G formula based on SCr of 0.88 mg/dL).   LIVER Recent Labs  Lab 07/03/22 2045 07/03/22 2054 07/04/22 0130 07/05/22 0500 07/06/22 1000 07/08/22 0454 07/09/22 1844  AST 97*  --  107* 156* 208* 165*  --   ALT 79*  --  81* 118* 165* 171*  --   ALKPHOS 213*  --  201* 255* 255* 200*  --   BILITOT 1.9*  --  1.8* 2.5* 1.9* 0.9  --   PROT 7.1  --  5.9* 5.9* 5.6* 5.2*  --   ALBUMIN 2.7*  --  2.6* 2.2* 1.9* 1.6*  --   INR  --  2.1* 2.1*  --   --   --  1.2     INFECTIOUS Recent Labs   Lab 07/03/22 2054 07/04/22 0130 07/05/22 0500  LATICACIDVEN 1.7 1.1  --   PROCALCITON  --  1.13 1.57     ENDOCRINE CBG (last 3)  Recent Labs    07/09/22 2309 07/10/22 0313 07/10/22 0744  GLUCAP 159* 127* 154*   CRITICAL CARE Performed by: Kennieth Rad   Total critical care time: 36 minutes  Critical care time was exclusive of separately billable procedures and treating other patients.  Critical care was necessary to treat or prevent imminent or life-threatening deterioration.  Critical care was time spent personally by me on the following activities: development of treatment plan with patient and/or surrogate as well as nursing, discussions with consultants, evaluation of patient's response to treatment, examination of patient, obtaining history from patient or surrogate, ordering and performing treatments and interventions, ordering and review of laboratory studies, ordering and review of radiographic studies, pulse oximetry and re-evaluation of patient's condition.     Kennieth Rad, MSN, AG-ACNP-BC [facility] 07/10/2022, 9:42 AM  See Amion for pager If no response to pager, please call PCCM consult pager After 7:00 pm call Elink

## 2022-07-11 ENCOUNTER — Ambulatory Visit: Payer: Medicare Other | Admitting: Nurse Practitioner

## 2022-07-11 DIAGNOSIS — G9340 Encephalopathy, unspecified: Secondary | ICD-10-CM | POA: Diagnosis not present

## 2022-07-11 DIAGNOSIS — E1142 Type 2 diabetes mellitus with diabetic polyneuropathy: Secondary | ICD-10-CM

## 2022-07-11 DIAGNOSIS — J9601 Acute respiratory failure with hypoxia: Secondary | ICD-10-CM | POA: Diagnosis not present

## 2022-07-11 DIAGNOSIS — J90 Pleural effusion, not elsewhere classified: Secondary | ICD-10-CM | POA: Diagnosis not present

## 2022-07-11 LAB — CBC
HCT: 24.6 % — ABNORMAL LOW (ref 39.0–52.0)
Hemoglobin: 7.7 g/dL — ABNORMAL LOW (ref 13.0–17.0)
MCH: 33.2 pg (ref 26.0–34.0)
MCHC: 31.3 g/dL (ref 30.0–36.0)
MCV: 106 fL — ABNORMAL HIGH (ref 80.0–100.0)
Platelets: 239 10*3/uL (ref 150–400)
RBC: 2.32 MIL/uL — ABNORMAL LOW (ref 4.22–5.81)
RDW: 15.9 % — ABNORMAL HIGH (ref 11.5–15.5)
WBC: 9.7 10*3/uL (ref 4.0–10.5)
nRBC: 0 % (ref 0.0–0.2)

## 2022-07-11 LAB — BASIC METABOLIC PANEL
Anion gap: 2 — ABNORMAL LOW (ref 5–15)
Anion gap: 3 — ABNORMAL LOW (ref 5–15)
BUN: 48 mg/dL — ABNORMAL HIGH (ref 8–23)
BUN: 40 mg/dL — ABNORMAL HIGH (ref 8–23)
CO2: 24 mmol/L (ref 22–32)
CO2: 26 mmol/L (ref 22–32)
Calcium: 8.5 mg/dL — ABNORMAL LOW (ref 8.9–10.3)
Calcium: 8.4 mg/dL — ABNORMAL LOW (ref 8.9–10.3)
Chloride: 122 mmol/L — ABNORMAL HIGH (ref 98–111)
Chloride: 119 mmol/L — ABNORMAL HIGH (ref 98–111)
Creatinine, Ser: 0.88 mg/dL (ref 0.61–1.24)
Creatinine, Ser: 0.97 mg/dL (ref 0.61–1.24)
GFR, Estimated: >60 mL/min (ref >60)
GFR, Estimated: >60 mL/min (ref >60)
Glucose, Bld: 173 mg/dL — ABNORMAL HIGH (ref 70–99)
Glucose, Bld: 132 mg/dL — ABNORMAL HIGH (ref 70–99)
Potassium: 4 mmol/L (ref 3.5–5.1)
Potassium: 4.3 mmol/L (ref 3.5–5.1)
Sodium: 148 mmol/L — ABNORMAL HIGH (ref 135–145)
Sodium: 148 mmol/L — ABNORMAL HIGH (ref 135–145)

## 2022-07-11 LAB — GLUCOSE, CAPILLARY
Glucose-Capillary: 103 mg/dL — ABNORMAL HIGH (ref 70–99)
Glucose-Capillary: 121 mg/dL — ABNORMAL HIGH (ref 70–99)
Glucose-Capillary: 124 mg/dL — ABNORMAL HIGH (ref 70–99)
Glucose-Capillary: 126 mg/dL — ABNORMAL HIGH (ref 70–99)
Glucose-Capillary: 128 mg/dL — ABNORMAL HIGH (ref 70–99)
Glucose-Capillary: 97 mg/dL (ref 70–99)

## 2022-07-11 LAB — MAGNESIUM: Magnesium: 2.8 mg/dL — ABNORMAL HIGH (ref 1.7–2.4)

## 2022-07-11 LAB — HEPARIN LEVEL (UNFRACTIONATED)
Heparin Unfractionated: 0.2 IU/mL — ABNORMAL LOW (ref 0.30–0.70)
Heparin Unfractionated: 0.33 IU/mL (ref 0.30–0.70)

## 2022-07-11 LAB — PHOSPHORUS: Phosphorus: 2.8 mg/dL (ref 2.5–4.6)

## 2022-07-11 MED ORDER — ORAL CARE MOUTH RINSE
15.0000 mL | OROMUCOSAL | Status: DC
  Administered 2022-07-11 – 2022-07-14 (×26): 15 mL via OROMUCOSAL

## 2022-07-11 MED ORDER — ORAL CARE MOUTH RINSE: 15.0000 mL | OROMUCOSAL | Status: DC | PRN

## 2022-07-11 MED ORDER — ORAL CARE MOUTH RINSE: 15.0000 mL | OROMUCOSAL | Status: DC

## 2022-07-11 MED ORDER — ORAL CARE MOUTH RINSE
15.0000 mL | OROMUCOSAL | Status: DC
  Administered 2022-07-11 (×2): 15 mL via OROMUCOSAL

## 2022-07-11 NOTE — Progress Notes (Signed)
ANTICOAGULATION CONSULT NOTE - Follow Up Consult  Pharmacy Consult for heparin Indication: atrial fibrillation  Labs: Recent Labs    07/08/22 0454 07/08/22 0642 07/09/22 0316 07/09/22 1337 07/09/22 1612 07/09/22 1844 07/09/22 2049 07/10/22 0217 07/10/22 1733 07/11/22 0253  HGB 7.3*  --  8.7*  --   --   --   --  7.8*  --  7.7*  HCT 23.1*  --  27.0*  --   --   --   --  25.0*  --  24.6*  PLT 191  --  264  --   --   --   --  219  --  239  APTT 78*  --  63*  --   --   --   --   --   --   --   LABPROT  --   --   --   --   --  14.9  --   --   --   --   INR  --   --   --   --   --  1.2  --   --   --   --   HEPARINUNFRC 0.41  --  0.14*  --  0.23*  --   --   --  0.12* 0.20*  CREATININE 1.13   < > 0.97   < >  --   --  0.90 0.88  --  0.97  TROPONINIHS  --   --   --   --   --   --  16  --   --   --    < > = values in this interval not displayed.    Assessment: 76yo male subtherapeutic on heparin after rate change; no infusion issues or signs of bleeding per RN, Hgb low but stable.  Goal of Therapy:  Heparin level 0.3-0.7 units/ml   Plan:  Will increase heparin infusion by 2 units/kg/hr to 1600 units/hr and check level in 8 hours.    Wynona Neat, PharmD, BCPS  07/11/2022,4:16 AM

## 2022-07-11 NOTE — Progress Notes (Signed)
                                                     Palliative Care Progress Note   Patient Name: JOVONTE COMMINS       Date: 07/11/2022 DOB: 1946-06-15  Age: 76 y.o. MRN#: [id number] Attending Physician: Jacky Kindle, MD Primary Care Physician: Sandrea Hughs, NP Admit Date: 06/16/2022  Dr. Tacy Learn reviewed Leonardo with patients wife [name]) and knowing that Germain has mentioned to [name] before that he would not want to remain on ventilator for long time, patient's wife and Dr. Tacy Learn agree that they were going to proceed with a one-way extubation and if Zamere begins to struggle, will not put him back on ventilator but will proceed with comfort measures.   I provided comfort and compassion during this time. [name] states she is extremely grateful for PMT and our services. I reiterated that if she needs anything to please do not hesitate to let us know.  Thank you for allowing the Palliative Medicine Team to assist in the care of SAEED TOREN.  Total Time 25 minutes  Greater than 50%  of this time was spent counseling and coordinating care related to the above assessment and plan.  Aloha Ilsa Iha, FNP-BC Palliative Medicine Team Team Phone # [phone]

## 2022-07-11 NOTE — Procedures (Signed)
Extubation Procedure Note  Patient Details:   Name: Darrell Horne DOB: [birth year] MRN: [id number]   Airway Documentation:    Vent end date: 07/11/22 Vent end time: 1356   Evaluation  O2 sats: stable throughout Complications: No apparent complications Patient did tolerate procedure well. Bilateral Breath Sounds: Clear, Diminished   Patient was one-way extubated per MD order & placed on 4L [place]. Patient did cough when I pulled ET tube out, but not able to speak.   Kathie Dike 07/11/2022, 2:03 PM

## 2022-07-11 NOTE — Progress Notes (Signed)
ANTICOAGULATION CONSULT NOTE  Pharmacy Consult for heparin Indication: atrial fibrillation  No Known Allergies  Patient Measurements: Height: '5\' 10"'$  (177.8 cm) Weight: 82 kg (180 lb 12.4 oz) IBW/kg (Calculated) : 73 Heparin Dosing Weight: 77 kg  Vital Signs: Temp: 98.3 F (36.8 C) (10/02 1134) Temp Source: Axillary (10/02 1134) BP: 91/54 (10/02 1200) Pulse Rate: 75 (10/02 1200)  Labs: Recent Labs    07/09/22 0316 07/09/22 1337 07/09/22 1844 07/09/22 2049 07/10/22 0217 07/10/22 1733 07/11/22 0253 07/11/22 1159  HGB 8.7*  --   --   --  7.8*  --  7.7*  --   HCT 27.0*  --   --   --  25.0*  --  24.6*  --   PLT 264  --   --   --  219  --  239  --   APTT 63*  --   --   --   --   --   --   --   LABPROT  --   --  14.9  --   --   --   --   --   INR  --   --  1.2  --   --   --   --   --   HEPARINUNFRC 0.14*   < >  --   --   --  0.12* 0.20* 0.33  CREATININE 0.97   < >  --  0.90 0.88  --  0.97  --   TROPONINIHS  --   --   --  16  --   --   --   --    < > = values in this interval not displayed.     Estimated Creatinine Clearance: 66.9 mL/min (by C-G formula based on SCr of 0.97 mg/dL).  Assessment: 76 y/o male admitted for altered mental status and bilateral pleural effusions. PMH of atrial fibrillation on eliquis PTA (last dose morning of 9/26). Apixaban held in the setting of increasing LFTs. Pharmacy was consulted to dose heparin. Bleeding in ET tube has resolved per RN today - reported some tan-ish secretions.    HL 0.33 (therapeutic) Hgb 7.7/Plt 239 (stable)   Goal of Therapy:  Heparin level 0.3-0.7 units/hr Monitor platelets by anticoagulation protocol: Yes   Plan:  Continue heparin drip at 1600 units/hr  Check HL, CBC daily   Monitor H & H daily and S/SX of bleeding.    Wilson Singer, PharmD Clinical Pharmacist 07/11/2022 1:05 PM

## 2022-07-11 NOTE — Progress Notes (Signed)
NAME:  Darrell Horne, MRN:  [id number], DOB:  01/15/1946, LOS: 46 ADMISSION DATE:  06/16/2022, CONSULTATION DATE:  06/22/2022 REFERRING MD:  Dr. Louanne Belton, CHIEF COMPLAINT:  Acute Hypoxic Respiratory Failure, Pleural Effusion   History of Present Illness:  Darrell Horne is a 76 year old male with a past medical history of paroxysmal A-fib on Eliquis, left BKA, HTN, HLD, and alcoholic cirrhosis who presented to the hospital with altered mental status and was initially admitted on 06/16/2022 with hepatic hydrothorax. PCCM was consulted for further evaluation and management of his right-sided pleural effusion and acute hypoxic respiratory failure. He is s/p thoracenteses and b/l chest tube placement. On 9/22, he developed  progressive confusion requiring intubation on 9/25 for airway protection.    Noted to have cirrhosis on imaging in 2015.  Right upper quadrant ultrasound this admission with cirrhotic liver but only trace ascites  Pertinent  Medical History    has a past medical history of Chest pain, Diabetes mellitus, DM2 (diabetes mellitus, type 2) ([place]), GERD (gastroesophageal reflux disease), HLD (hyperlipidemia), HTN (hypertension), PAF (paroxysmal atrial fibrillation) ([place]), and Palpitations.   has a past surgical history that includes Total ankle arthroplasty ([id number]); Cholecystectomy; Shoulder surgery ([id number]); Hernia repair; Appendectomy; Leg amputation (Left, [id number]); Colonoscopy; Colonoscopy with propofol (N/A, 11/06/2018); polypectomy (11/06/2018); and IR THORACENTESIS ASP PLEURAL SPACE W/IMG GUIDE (06/17/2022).  Significant Hospital Events: Including procedures, antibiotic start and stop dates in addition to other pertinent events   9/8 thoracentesis with return of 1.2 L of transudative fluid 06/18/2022: Stage II partial-thickness left medial buttock pressure injury noted low 9/12 increasing oxygen requirements with up to 6 L high flow nasal cannula and episodes of hypotension with systolic  of 89.  Albumin, midodrine, and tube feeds started.  Core track placed. 9/13 PCCM consulted, Eliquis held for planned thoracentesis tomorrow. CXR with stable pulm edema and pleural effusions 9/14 increased O2 requirements, stable on BiPAP now. Bilateral chest tubes placed with return of 6 L of yellow fluid. Off BiPAP 9/15 doing well off BiPAP overnight, on 5L [place], slowed drainage from bilat chest tubes, drastically improved peripheral edema.  9/16 off bipap. Remains off pressors. Stable to transfer out of ICU  9/19 - Lactulose held due to diarrhea . More responsive today. Bilateral chest tubes clamped since 9/18.  Transudative pleural effusion deemed due to cirrhosis and cardiac impairment.  Removal of chest tubes.  Oxygen level 96% on room air.  With diuresis he is -18 L.  Delirium thought to be slowly improving ammonia level 20.  Lactulose is being continued.  For A-fib oral amiodarone and oral metoprolol continued with Eliquis.  Being maintained on midodrine as well.   Critical care medicine signed off 06/29/2022: Continue diuresis.  Improved alertness noticed. 07/01/2022: Worsening confusion but also fluctuating confusion.  Continue diuresis. 07/02/2022: Lethargic worsening confusion.  Afebrile per hospitalist 07/03/2022: PCCM Consulted for confusion and respiratory distress, transferred to ICU 9/25: Intubated  9/26: Bilateral thoracentesis w/ 1200 cc's serosnanguinous fluid drained from right pleural space and 1500 ccs of yellowish fluid from left pleural space. Had large left-sided pneumothorax afterward with pigtail chest tube placed 9/30 weaning precedex, back on low dose NE, bloody ETT secretions> heparin held  Interim History / Subjective:  Patient converted to sinus rhythm after having runs of A-fib with RVR Remains on low-dose phenylephrine Tolerating spontaneous breathing trial  Objective   Blood pressure (!) 91/58, pulse 75, temperature (!) 97.4 F (36.3 C), temperature source  Axillary, resp. rate (!) 24, height '5\' 10"'$  (  1.778 m), weight 82 kg, SpO2 100 %. CVP:  [0 mmHg-42 mmHg] 5 mmHg  Vent Mode: PRVC FiO2 (%):  [30 %] 30 % Set Rate:  [12 bmp] 12 bmp Vt Set:  [580 mL] 580 mL PEEP:  [5 cmH20] 5 cmH20 Plateau Pressure:  [17 cmH20-22 cmH20] 17 cmH20   Intake/Output Summary (Last 24 hours) at 07/11/2022 1018 Last data filed at 07/11/2022 1000 Gross per 24 hour  Intake 4311.47 ml  Output 2145 ml  Net 2166.47 ml   Filed Weights   07/09/22 0322 07/10/22 0214 07/11/22 0434  Weight: 77 kg 78.8 kg 82 kg    Physical exam:  Physical exam: General: Crtitically ill-appearing male, orally intubated HEENT: [place]/AT, eyes anicteric.  ETT and OGT in place Neuro: Opens eyes with verbal stimuli, following simple commands, moving all 4 extremities Chest: Coarse breath sounds, no wheezes or rhonchi Heart: Regular rate and rhythm, no murmurs or gallops Abdomen: Soft, distended, bowel sounds present Skin: No rash  Resolved Hospital Problem list     Assessment & Plan:  Acute hypoxemic respiratory failure, multifactorial superimposed pneumonia with ESBL producing Klebsiella  Acute on chronic b/l pleural effusions,  Iatrogenic left Pneumothroax  Acute septic/metabolic encephalopathy due to liver cirrhosis Patient completed 5-day course of meropenem Continue lung protective ventilation He is tolerating spontaneous breathing trial Mental status slightly better, awaiting patient's wife to come before trial of extubation as per discussion with palliative care team, goals of care discussions were carried and decision was to have a one-way extubation  Continue PAD protocol with Precedex, RASS goal 0/-1 Continue chest tube care, stop suction on chest, keep it under waterseal with plan to discontinue tomorrow if no recurrence of pneumothorax  Patient induced hypotension Patient still requiring low-dose phenylephrine Continue midodrine Try to keep sedation low  Portal  Hypertension Decompensated alcoholic liver cirrhosis, ascites, pleural effusion, volume overload Coagulopathy Bilateral hydrothorax Continue spironolactone Continue lactulose and rifaximin Hold Lasix  Paroxysmal Afib, with RVR Prolonged Qtc Nonsustained V. tach CHADSVASC score 4 Patient converted back to sinus rhythm Continue heparin gtt and monitor closely for bleeding Continue amiodarone monitor Monitor and supplement electrolytes  Anemia of chronic disease H/H remains stable  Transfuse if hemoglobin less than 7  Hypernatremia, due to diuresis Continue free water flushes  Hypokalemia, resolved  Malnutrition -severe protein calorie Hyperglycemia, likely stress/tube feed related Continue tube feeds Monitor fingerstick with goal 140-180  Pressure injury stage II left medial buttock - Present on Admit Continue wound care  Best Practice (right click and "Reselect all SmartList Selections" daily)   Diet/type: Tube feeds DVT prophylaxis: LMWH  GI prophylaxis: PPI Lines: Yes needed Foley:  Yes, and it is still needed Code Status:  full code Last date of multidisciplinary goals of care discussion [9/27 with palliative medicine- patient made DNR]  LABS    PULMONARY Recent Labs  Lab 07/04/22 1021  PHART 7.385  PCO2ART 41.4  PO2ART 448*  HCO3 25.0  TCO2 26  O2SAT 100    CBC Recent Labs  Lab 07/09/22 0316 07/10/22 0217 07/11/22 0253  HGB 8.7* 7.8* 7.7*  HCT 27.0* 25.0* 24.6*  WBC 10.4 7.7 9.7  PLT 264 219 239    COAGULATION Recent Labs  Lab 07/09/22 1844  INR 1.2    CARDIAC  No results for input(s): "TROPONINI" in the last 168 hours. No results for input(s): "PROBNP" in the last 168 hours.   CHEMISTRY Recent Labs  Lab 07/08/22 0454 07/08/22 4656 07/09/22 0316 07/09/22 1337 07/09/22 2049  07/10/22 0217 07/11/22 0253  NA 151*   < > 154* 151* 152* 148* 148*  K 2.8*   < > 4.2 4.4 4.2 4.0 4.3  CL 126*   < > 125* 122* 125* 122* 119*  CO2  22   < > '24 24 24 24 26  '$ GLUCOSE 206*   < > 198* 173* 186* 173* 132*  BUN 60*   < > 61* 58* 50* 48* 40*  CREATININE 1.13   < > 0.97 0.93 0.90 0.88 0.97  CALCIUM 7.8*   < > 8.6* 8.7* 8.6* 8.5* 8.4*  MG 2.4  --  2.7*  --  2.7* 2.8* 2.8*  PHOS  --   --   --   --   --  2.6 2.8   < > = values in this interval not displayed.   Estimated Creatinine Clearance: 66.9 mL/min (by C-G formula based on SCr of 0.97 mg/dL).   LIVER Recent Labs  Lab 07/05/22 0500 07/06/22 1000 07/08/22 0454 07/09/22 1844  AST 156* 208* 165*  --   ALT 118* 165* 171*  --   ALKPHOS 255* 255* 200*  --   BILITOT 2.5* 1.9* 0.9  --   PROT 5.9* 5.6* 5.2*  --   ALBUMIN 2.2* 1.9* 1.6*  --   INR  --   --   --  1.2     INFECTIOUS Recent Labs  Lab 07/05/22 0500  PROCALCITON 1.57     ENDOCRINE CBG (last 3)  Recent Labs    07/10/22 2318 07/11/22 0259 07/11/22 0801  GLUCAP 116* 124* 121*     Total critical care time: 38 minutes  Performed by: Jacky Kindle   Critical care time was exclusive of separately billable procedures and treating other patients.   Critical care was necessary to treat or prevent imminent or life-threatening deterioration.   Critical care was time spent personally by me on the following activities: development of treatment plan with patient and/or surrogate as well as nursing, discussions with consultants, evaluation of patient's response to treatment, examination of patient, obtaining history from patient or surrogate, ordering and performing treatments and interventions, ordering and review of laboratory studies, ordering and review of radiographic studies, pulse oximetry and re-evaluation of patient's condition.   Jacky Kindle, MD [facility] See Amion for pager If no response to pager, please call [phone] until 7pm After 7pm, Please call E-link [phone]

## 2022-07-11 NOTE — IPAL (Signed)
  Interdisciplinary Goals of Care Family Meeting   Date carried out: 07/11/2022  Location of the meeting: Unit  Member's involved: Physician, Bedside Registered Nurse, and Family Member or next of kin  Durable Power of Attorney or acting medical decision maker: Edwyn Inclan    Discussion: We discussed goals of care for [employer] .   Updated patient's wife regarding patient's condition, he is tolerating spontaneous breathing trial but due to advanced liver disease he is at high risk of clinical decompensation and building up fluid outside of his lungs, D[place]is has mentioned to [place] before that he would not want to remain on ventilator for long time, patient's wife and I agree that we will proceed with extubation and if [place] to struggle, will not put him back on ventilator but will proceed with comfort measures. Extubation orders were placed  Code status: Full DNR  Disposition: Continue current acute care  Time spent for the meeting: 15 min    Jacky Kindle, MD  07/11/2022, 12:57 PM

## 2022-07-11 NOTE — Progress Notes (Addendum)
Nutrition Follow-up  DOCUMENTATION CODES:   Severe malnutrition in context of chronic illness  INTERVENTION:   Continue TF via Cortrak tube: Osmolite 1.5 at 55 ml/h (1320 ml per day) Prosource TF20 60 ml once daily  Provides 2060 kcal, 103 gm protein, 1006 ml free water daily.  Continue MVI with minerals 1 tablet via tube once daily.  Recommend continue above TF regimen via Cortrak tube after extubation.   NUTRITION DIAGNOSIS:   Severe Malnutrition (in the context of chronic illness) related to poor appetite as evidenced by severe muscle depletion, severe fat depletion.  Ongoing   GOAL:   Patient will meet greater than or equal to 90% of their needs  Met with TF  MONITOR:   Weight trends, Vent status, Labs, I & O's  REASON FOR ASSESSMENT:   Consult Assessment of nutrition requirement/status, Enteral/tube feeding initiation and management  ASSESSMENT:   Pt with hx of A.fib, Left BKA, GERD, DM type 2, HTN, and HLD presented to ED from SNF with AMS. Found to have right-sided pleural effusion but source of AMS was unclear.  Patient with respiratory failure, PNA, decompensated alcoholic liver cirrhosis, ascites, pleural effusion, volume overload, bilateral hydrothorax. Palliative care team following. Plans for one way extubation when wife comes in.   Cortrak in place with tip at the gastric antrum or duodenal bulb. Receiving Osmolite 1.5 at 55 ml/h with Prosource TF20 60 ml once daily. Free water flushes 200 ml every 4 hours.   Patient remains intubated on ventilator support MV: 9 L/min Temp (24hrs), Avg:98.6 F (37 C), Min:97.4 F (36.3 C), Max:100.2 F (37.9 C)  Labs reviewed. Na 148 CBG: 124-121  Medications reviewed and include phenylephrine, Precedex, folic acid, Novolog SSI + TF coverage 5 units every 4 hours, lactulose, MVI with minerals, spironolactone, thiamine.  Rectal tube in place with 1000 ml output x 24 hours. Chest tube in place with 25 ml output  x 24 hours. UOP 1050 ml x 24 hours. Net IO Since Admission: -9,898.65 mL [07/11/22 1211]  Diet Order:   Diet Order     None       EDUCATION NEEDS:   Not appropriate for education at this time  Skin:  Skin Assessment: Skin Integrity Issues: Skin Integrity Issues:: DTI, Stage II, Other (Comment) DTI: R & L buttocks Stage II: L buttocks Other: R hand burn; non pressure wound (fissure) to buttocks/sacrum  Last BM:  10/2 rectal tube  Height:   Ht Readings from Last 1 Encounters:  07/04/22 5' 10" (1.778 m)    Weight:   Wt Readings from Last 1 Encounters:  07/11/22 82 kg    Ideal Body Weight:  71 kg  BMI:  Body mass index is 25.94 kg/m.  Estimated Nutritional Needs:   Kcal:  2000-2200 kcal/d  Protein:  100-120g/d  Fluid:  2-2.2L/d   Lucas Mallow RD, LDN, CNSC Please refer to Amion for contact information.

## 2022-07-12 ENCOUNTER — Inpatient Hospital Stay (HOSPITAL_COMMUNITY): Payer: Medicare Other

## 2022-07-12 DIAGNOSIS — J90 Pleural effusion, not elsewhere classified: Secondary | ICD-10-CM | POA: Diagnosis not present

## 2022-07-12 DIAGNOSIS — E43 Unspecified severe protein-calorie malnutrition: Secondary | ICD-10-CM | POA: Diagnosis not present

## 2022-07-12 DIAGNOSIS — Z89512 Acquired absence of left leg below knee: Secondary | ICD-10-CM

## 2022-07-12 DIAGNOSIS — G9340 Encephalopathy, unspecified: Secondary | ICD-10-CM | POA: Diagnosis not present

## 2022-07-12 DIAGNOSIS — J9601 Acute respiratory failure with hypoxia: Secondary | ICD-10-CM | POA: Diagnosis not present

## 2022-07-12 LAB — GLUCOSE, CAPILLARY
Glucose-Capillary: 103 mg/dL — ABNORMAL HIGH (ref 70–99)
Glucose-Capillary: 111 mg/dL — ABNORMAL HIGH (ref 70–99)
Glucose-Capillary: 98 mg/dL (ref 70–99)
Glucose-Capillary: 89 mg/dL (ref 70–99)
Glucose-Capillary: 101 mg/dL — ABNORMAL HIGH (ref 70–99)

## 2022-07-12 LAB — CBC
HCT: 26.3 % — ABNORMAL LOW (ref 39.0–52.0)
Hemoglobin: 8.4 g/dL — ABNORMAL LOW (ref 13.0–17.0)
MCH: 33.2 pg (ref 26.0–34.0)
MCHC: 31.9 g/dL (ref 30.0–36.0)
MCV: 104 fL — ABNORMAL HIGH (ref 80.0–100.0)
Platelets: 217 10*3/uL (ref 150–400)
RBC: 2.53 MIL/uL — ABNORMAL LOW (ref 4.22–5.81)
RDW: 16 % — ABNORMAL HIGH (ref 11.5–15.5)
WBC: 9.2 10*3/uL (ref 4.0–10.5)
nRBC: 0 % (ref 0.0–0.2)

## 2022-07-12 LAB — HEPARIN LEVEL (UNFRACTIONATED): Heparin Unfractionated: 0.47 IU/mL (ref 0.30–0.70)

## 2022-07-12 LAB — PHOSPHORUS: Phosphorus: 2.7 mg/dL (ref 2.5–4.6)

## 2022-07-12 LAB — MAGNESIUM: Magnesium: 2.7 mg/dL — ABNORMAL HIGH (ref 1.7–2.4)

## 2022-07-12 MED ORDER — PANCRELIPASE (LIP-PROT-AMYL) 10440-39150 UNITS PO TABS
20880.0000 [IU] | ORAL_TABLET | Freq: Once | ORAL | Status: AC
  Administered 2022-07-12: 20880 [IU]
  Filled 2022-07-12: qty 2

## 2022-07-12 MED ORDER — ONDANSETRON HCL 4 MG/2ML IJ SOLN
4.0000 mg | Freq: Two times a day (BID) | INTRAMUSCULAR | Status: DC | PRN
  Administered 2022-07-12: 4 mg via INTRAVENOUS
  Filled 2022-07-12: qty 2

## 2022-07-12 MED ORDER — SODIUM BICARBONATE 650 MG PO TABS
650.0000 mg | ORAL_TABLET | Freq: Once | ORAL | Status: AC
  Administered 2022-07-12: 650 mg
  Filled 2022-07-12: qty 1

## 2022-07-12 MED ORDER — AMIODARONE HCL 200 MG PO TABS
200.0000 mg | ORAL_TABLET | Freq: Two times a day (BID) | ORAL | Status: DC
  Administered 2022-07-12: 200 mg
  Filled 2022-07-12: qty 1

## 2022-07-12 MED ORDER — LACTATED RINGERS IV SOLN: INTRAVENOUS | Status: DC

## 2022-07-12 MED ORDER — INSULIN ASPART 100 UNIT/ML IJ SOLN
3.0000 [IU] | INTRAMUSCULAR | Status: DC
  Administered 2022-07-12: 3 [IU] via SUBCUTANEOUS

## 2022-07-12 MED ORDER — APIXABAN 5 MG PO TABS
5.0000 mg | ORAL_TABLET | Freq: Two times a day (BID) | ORAL | Status: DC
  Administered 2022-07-12: 5 mg
  Filled 2022-07-12: qty 1

## 2022-07-12 MED ORDER — SODIUM BICARBONATE 650 MG PO TABS: 650.0000 mg | ORAL_TABLET | Freq: Once | ORAL | Status: DC

## 2022-07-12 MED ORDER — METOPROLOL TARTRATE 12.5 MG HALF TABLET
12.5000 mg | ORAL_TABLET | Freq: Three times a day (TID) | ORAL | Status: DC
  Administered 2022-07-12 (×2): 12.5 mg via ORAL
  Filled 2022-07-12 (×2): qty 1

## 2022-07-12 MED ORDER — PANCRELIPASE (LIP-PROT-AMYL) 10440-39150 UNITS PO TABS
20880.0000 [IU] | ORAL_TABLET | Freq: Once | ORAL | Status: DC
  Filled 2022-07-12: qty 2

## 2022-07-12 MED ORDER — LACTULOSE ENEMA
300.0000 mL | Freq: Two times a day (BID) | ORAL | Status: DC
  Administered 2022-07-12: 300 mL via RECTAL
  Filled 2022-07-12 (×2): qty 300

## 2022-07-12 NOTE — Evaluation (Signed)
Clinical/Bedside Swallow Evaluation Patient Details  Name: Darrell Horne MRN: [id number] Date of Birth: 12-25-1945  Today's Date: 07/12/2022 Time: SLP Start Time (ACUTE ONLY): 0845 SLP Stop Time (ACUTE ONLY): 0858 SLP Time Calculation (min) (ACUTE ONLY): 13 min  Past Medical History:  Past Medical History:  Diagnosis Date   Chest pain    Diabetes mellitus    DM2 (diabetes mellitus, type 2) ([facility])    GERD (gastroesophageal reflux disease)    HLD (hyperlipidemia)    HTN (hypertension)    PAF (paroxysmal atrial fibrillation) ([facility])    Palpitations    Past Surgical History:  Past Surgical History:  Procedure Laterality Date   APPENDECTOMY     CHOLECYSTECTOMY     COLONOSCOPY     Dr. Michail Sermon   COLONOSCOPY WITH PROPOFOL N/A 11/06/2018   Procedure: COLONOSCOPY WITH PROPOFOL;  Surgeon: Wilford Corner, MD;  Location: [facility];  Service: Endoscopy;  Laterality: N/A;   HERNIA REPAIR     IR THORACENTESIS ASP PLEURAL SPACE W/IMG GUIDE  06/17/2022   LEG AMPUTATION Left [id number]   Dr. Kendell Bane   POLYPECTOMY  11/06/2018   Procedure: POLYPECTOMY;  Surgeon: Wilford Corner, MD;  Location: [facility];  Service: Endoscopy;;   SHOULDER SURGERY  [id number]   Dr. Meridee Score   TOTAL ANKLE ARTHROPLASTY  [id number]   HPI:  Patient is a 76 y/o male who presents on 9/7 from [facility] for AMS. Admitted with acute encephalopathy and respiratory failure. CXR-pleural effusion s/p thoracentesis 9/8. 9/14 increased O2 requirements with Bil chest tubes places. Recent admission 8/25-9/1 for acute toxic encephalopathy-drug toxicity/polypharmacy overdosing. PMH includes chronic A-fib on Eliquis, DM, HTN, peripheral neuropathy, left BKA. Previous swallow assessment, 9/22, thin liquids/dys 1 diet recommended.  Intubated 9/25, one way extubation 10/2.    Assessment / Plan / Recommendation  Clinical Impression   Pt seen for swallow evaluation s/p prolonged intubation (9/25-10/2). On previous swallow  assessment (9/22) pt presented with oral dysphagia, no overt s/sx aspiration noted throughout, and thin liquids/dys 1 diet ordered. Pt alert throughout this encounter, however, considerably confused. He required maximal cues to follow commands and the majority of his verbal output was not meaningful. Oral motor assessment appeared WFL, however, pt is edentulous. Pt given trials of thin liquids and pure consistency. Delayed throat clear and wet vocal quality noted once with thin liquids. The first trial of puree consistency, the pt had immediate coughing episode which led to minimal amounts of emesis. Therapist suctioned emesis from oral cavity. Further PO trials held as pt continued to cough after emesis. Objective evaluation of swallow required due to noted overt s/sx of aspiration. ST will follow for MBS (likely 10/4).  SLP Visit Diagnosis: Dysphagia, unspecified (R13.10)    Aspiration Risk  Moderate aspiration risk    Diet Recommendation NPO   Medication Administration: Via alternative means    Other  Recommendations Oral Care Recommendations: Oral care QID    Recommendations for follow up therapy are one component of a multi-disciplinary discharge planning process, led by the attending physician.  Recommendations may be updated based on patient status, additional functional criteria and insurance authorization.  Follow up Recommendations Skilled nursing-short term rehab (<3 hours/day)      Assistance Recommended at Discharge Frequent or constant Supervision/Assistance  Functional Status Assessment Patient has had a recent decline in their functional status and demonstrates the ability to make significant improvements in function in a reasonable and predictable amount of time.  Frequency and Duration  Prognosis Prognosis for Safe Diet Advancement: Fair Barriers to Reach Goals: Cognitive deficits      Swallow Study   General Date of Onset: 06/16/22 HPI: Patient is a 76  y/o male who presents on 9/7 from [facility] for AMS. Admitted with acute encephalopathy and respiratory failure. CXR-pleural effusion s/p thoracentesis 9/8. 9/14 increased O2 requirements with Bil chest tubes places. Recent admission 8/25-9/1 for acute toxic encephalopathy-drug toxicity/polypharmacy overdosing. PMH includes chronic A-fib on Eliquis, DM, HTN, peripheral neuropathy, left BKA. Previous swallow assessment, 9/22, thin liquids/dys 1 diet recommended.  Intubated 9/25, one way extubation 10/2. Type of Study: Bedside Swallow Evaluation Previous Swallow Assessment: 9/22 - Bedside Swallow Evalution Diet Prior to this Study: NPO Temperature Spikes Noted: No Respiratory Status: Nasal cannula History of Recent Intubation: Yes Length of Intubations (days): 8 days Date extubated: 07/11/22 Behavior/Cognition: Distractible;Lethargic/Drowsy;Confused;Cooperative;Alert Oral Cavity Assessment: Within Functional Limits Oral Care Completed by SLP: No Oral Cavity - Dentition: Edentulous Vision: Functional for self-feeding Self-Feeding Abilities: Total assist Patient Positioning: Upright in bed Baseline Vocal Quality: Normal Volitional Cough: Strong;Congested Volitional Swallow: Unable to elicit    Oral/Motor/Sensory Function Overall Oral Motor/Sensory Function: Within functional limits   Ice Chips Ice chips: Not tested   Thin Liquid Thin Liquid: Impaired Presentation: Straw Pharyngeal  Phase Impairments: Cough - Immediate;Throat Clearing - Immediate;Multiple swallows    Nectar Thick Nectar Thick Liquid: Not tested   Honey Thick Honey Thick Liquid: Not tested   Puree Puree: Within functional limits Presentation: Spoon   Solid     Solid: Not tested      [facility] SLP  07/12/2022,10:43 AM

## 2022-07-12 NOTE — Progress Notes (Signed)
Pt Cortrack is clogged and not flushing. Paged MD. MD placed unclogged order. Will update MD if it is not unclogged for further instructions.

## 2022-07-12 NOTE — Progress Notes (Signed)
PT Cancellation Note  Patient Details Name: Darrell Horne MRN: [id number] DOB: 24-Apr-1946   Cancelled Treatment:    Reason Eval/Treat Not Completed: Other (comment) - pt moving to another unit by RN upon PT arrival to room, PT to check back as schedule allows.   Stacie Glaze, PT DPT Acute Rehabilitation Services Pager [phone]  Office [phone]    [name] 07/12/2022, 2:26 PM

## 2022-07-12 NOTE — Progress Notes (Signed)
Inpatient Diabetes Program Recommendations  AACE/ADA: New Consensus Statement on Inpatient Glycemic Control (2015)  Target Ranges:  Prepandial:   less than 140 mg/dL      Peak postprandial:   less than 180 mg/dL (1-2 hours)      Critically ill patients:  140 - 180 mg/dL   Lab Results  Component Value Date   GLUCAP 98 07/12/2022   HGBA1C 4.8 04/19/2022    Review of Glycemic Control  Latest Reference Range & Units 07/11/22 19:45 07/11/22 23:55 07/12/22 03:40 07/12/22 08:00 07/12/22 12:01  Glucose-Capillary 70 - 99 mg/dL 103 (H) 97 103 (H) 111 (H) 98   Diabetes history: None Current orders for Inpatient glycemic control:  Novolog 0-9 units q 4 hours Novolog 5 units q 4 hours Osmolite 55 ml/hr  Inpatient Diabetes Program Recommendations:    Consider reducing Novolog tube feed coverage to 3 units q 4 hours.   Thanks,  Adah Perl, RN, BC-ADM Inpatient Diabetes Coordinator Pager [phone]  (8a-5p)

## 2022-07-12 NOTE — Progress Notes (Signed)
Pharmacy ICU Insulin Management  CBGs above goal 140-180 mg/dL: No    Current regimen (include units of SSI in last 24hr): SSI 9u/24h on sensitive  Insulin aspart 5 Q4H   Medications affecting CBG levels: tube feeds at goal   Plan: Reduce tube feeding coverage from insulin aspart 5u q4h > 3u q4h  All of fingerstick readings are < 180.     Wilson Singer, PharmD Clinical Pharmacist 07/12/2022 10:54 AM

## 2022-07-12 NOTE — Progress Notes (Signed)
ANTICOAGULATION CONSULT NOTE - Initial Consult  Pharmacy Consult for heparin Indication: atrial fibrillation  No Known Allergies  Patient Measurements: Height: '5\' 10"'$  (177.8 cm) Weight: 82.6 kg (182 lb 1.6 oz) IBW/kg (Calculated) : 73 Heparin Dosing Weight: 83 kg  Vital Signs: Temp: 98.3 F (36.8 C) (10/03 1500) Temp Source: Oral (10/03 1500) BP: 92/59 (10/03 1900) Pulse Rate: 98 (10/03 1900)  Labs: Recent Labs    07/10/22 0217 07/10/22 1733 07/11/22 0253 07/11/22 1159 07/12/22 0600  HGB 7.8*  --  7.7*  --  8.4*  HCT 25.0*  --  24.6*  --  26.3*  PLT 219  --  239  --  217  HEPARINUNFRC  --    < > 0.20* 0.33 0.47  CREATININE 0.88  --  0.97  --   --    < > = values in this interval not displayed.    Estimated Creatinine Clearance: 66.9 mL/min (by C-G formula based on SCr of 0.97 mg/dL).   Medical History: Past Medical History:  Diagnosis Date   Chest pain    Diabetes mellitus    DM2 (diabetes mellitus, type 2) (HCC)    GERD (gastroesophageal reflux disease)    HLD (hyperlipidemia)    HTN (hypertension)    PAF (paroxysmal atrial fibrillation) (HCC)    Palpitations     Goal of Therapy:  aPTT 66-102 seconds Monitor platelets by anticoagulation protocol: Yes   Assessment/Plan: 76 y/o male admitted on 06/16/22 for altered mental status and hepatic hydrothorax. Patient had been transitioned this morning back to Eliquis for paroxysmal A-fib, but cortrak is no longer flushing so switching to heparin.  Start heparin infusion at 1600 units/hr Monitor PTT, CBC  Lerry Liner, PharmD Candidate class of 2024 07/12/2022,11:23 PM

## 2022-07-12 NOTE — Progress Notes (Incomplete)
ANTICOAGULATION CONSULT NOTE - Initial Consult  Pharmacy Consult for heparin Indication: atrial fibrillation  No Known Allergies  Patient Measurements: Height: '5\' 10"'$  (177.8 cm) Weight: 82.6 kg (182 lb 1.6 oz) IBW/kg (Calculated) : 73 Heparin Dosing Weight: 83 kg  Vital Signs: Temp: 98.3 F (36.8 C) (10/03 1500) Temp Source: Oral (10/03 1500) BP: 92/59 (10/03 1900) Pulse Rate: 98 (10/03 1900)  Labs: Recent Labs    07/10/22 0217 07/10/22 1733 07/11/22 0253 07/11/22 1159 07/12/22 0600  HGB 7.8*  --  7.7*  --  8.4*  HCT 25.0*  --  24.6*  --  26.3*  PLT 219  --  239  --  217  HEPARINUNFRC  --    < > 0.20* 0.33 0.47  CREATININE 0.88  --  0.97  --   --    < > = values in this interval not displayed.    Estimated Creatinine Clearance: 66.9 mL/min (by C-G formula based on SCr of 0.97 mg/dL).   Medical History: Past Medical History:  Diagnosis Date  . Chest pain   . Diabetes mellitus   . DM2 (diabetes mellitus, type 2) ([place])   . GERD (gastroesophageal reflux disease)   . HLD (hyperlipidemia)   . HTN (hypertension)   . PAF (paroxysmal atrial fibrillation) ([place])   . Palpitations     Goal of Therapy:  aPTT 66-102 seconds Monitor platelets by anticoagulation protocol: Yes   Assessment/Plan: 76 y/o male admitted on 06/16/22 for altered mental status and hepatic hydrothorax. Patient had been transitioned this morning back to Eliquis for paroxysmal A-fib, but cortrak is no longer flushing so switching to heparin.  Start heparin infusion at 1600 units/hr Monitor PTT, CBC  Lerry Liner, PharmD Candidate class of 2024 07/12/2022,11:23 PM

## 2022-07-12 NOTE — Progress Notes (Addendum)
TRH night coverage note:  Called by RN, cortrak not flushing, not able to use.  Pt on numerous PO meds, some of which dont have an IV alternative really.  Discussed with [place] Crane, PA-C with PCCM as well since he was just on their service and their name on chart.  Tried to troubleshoot by using first declot protocol (failed).  Then removed cortrack and tried to get NGT placed (unable to place).  At this point will be NPO until Thurs.  Addressing each of the PO meds as follows: midodrine '15mg'$  Q8H scheduled -> no good IV alternative available in progressive care, discussed with [place] Crane PA-C: plan will be to monitor BP for now, call PCCM back if MAP < 65. Also ordered BMP for AM to monitor renal fxn. Lactulose: switch to PR enemas BID Check ammonia in AM Rifaxan: no IV alternative really FWF and Tube feeds: DCd for now, putting on maint IVF at rate of 80. Aldactone: hold for the moment Metoprolol: hold for the moment, dont want to make him more hypotensive than he is / is going to be when midodrine wears off DCd scheduled Q4 insulin and just doing the SSI for the moment since no tube feeds Eliquis: will switch to heparin gtt per pharm for the moment Amiodarone PO Will leave this med on hold for the moment since half life is ~58 days on average and missing 1-2 days wont really affect levels. But I'm not so sure amiodarone is a great med for him to be on with his liver disease anyhow?  Ill defer the question of "do we ever restart amiodarone even after getting PO access" to the day team.

## 2022-07-12 NOTE — Progress Notes (Signed)
Transferred -in from  [facility] by bed awake and alert.

## 2022-07-12 NOTE — Progress Notes (Signed)
NAME:  STEWART SASAKI, MRN:  [id number], DOB:  Feb 22, 1946, LOS: 16 ADMISSION DATE:  06/16/2022, CONSULTATION DATE:  06/22/2022 REFERRING MD:  Dr. Louanne Belton, CHIEF COMPLAINT:  Acute Hypoxic Respiratory Failure, Pleural Effusion   History of Present Illness:  Darrell Horne is a 76 year old male with a past medical history of paroxysmal A-fib on Eliquis, left BKA, HTN, HLD, and alcoholic cirrhosis who presented to the hospital with altered mental status and was initially admitted on 06/16/2022 with hepatic hydrothorax. PCCM was consulted for further evaluation and management of his right-sided pleural effusion and acute hypoxic respiratory failure. He is s/p thoracenteses and b/l chest tube placement. On 9/22, he developed  progressive confusion requiring intubation on 9/25 for airway protection.    Noted to have cirrhosis on imaging in 2015.  Right upper quadrant ultrasound this admission with cirrhotic liver but only trace ascites  Pertinent  Medical History    has a past medical history of Chest pain, Diabetes mellitus, DM2 (diabetes mellitus, type 2) ([place]), GERD (gastroesophageal reflux disease), HLD (hyperlipidemia), HTN (hypertension), PAF (paroxysmal atrial fibrillation) ([place]), and Palpitations.   has a past surgical history that includes Total ankle arthroplasty (28315176); Cholecystectomy; Shoulder surgery ([id number]); Hernia repair; Appendectomy; Leg amputation (Left, [id number]); Colonoscopy; Colonoscopy with propofol (N/A, 11/06/2018); polypectomy (11/06/2018); and IR THORACENTESIS ASP PLEURAL SPACE W/IMG GUIDE (06/17/2022).  Significant Hospital Events: Including procedures, antibiotic start and stop dates in addition to other pertinent events   9/8 thoracentesis with return of 1.2 L of transudative fluid 06/18/2022: Stage II partial-thickness left medial buttock pressure injury noted low 9/12 increasing oxygen requirements with up to 6 L high flow nasal cannula and episodes of hypotension with systolic  of 89.  Albumin, midodrine, and tube feeds started.  Core track placed. 9/13 PCCM consulted, Eliquis held for planned thoracentesis tomorrow. CXR with stable pulm edema and pleural effusions 9/14 increased O2 requirements, stable on BiPAP now. Bilateral chest tubes placed with return of 6 L of yellow fluid. Off BiPAP 9/15 doing well off BiPAP overnight, on 5L [place], slowed drainage from bilat chest tubes, drastically improved peripheral edema.  9/16 off bipap. Remains off pressors. Stable to transfer out of ICU  9/19 - Lactulose held due to diarrhea . More responsive today. Bilateral chest tubes clamped since 9/18.  Transudative pleural effusion deemed due to cirrhosis and cardiac impairment.  Removal of chest tubes.  Oxygen level 96% on room air.  With diuresis he is -18 L.  Delirium thought to be slowly improving ammonia level 20.  Lactulose is being continued.  For A-fib oral amiodarone and oral metoprolol continued with Eliquis.  Being maintained on midodrine as well.   Critical care medicine signed off 06/29/2022: Continue diuresis.  Improved alertness noticed. 07/01/2022: Worsening confusion but also fluctuating confusion.  Continue diuresis. 07/02/2022: Lethargic worsening confusion.  Afebrile per hospitalist 07/03/2022: PCCM Consulted for confusion and respiratory distress, transferred to ICU 9/25: Intubated  9/26: Bilateral thoracentesis w/ 1200 cc's serosnanguinous fluid drained from right pleural space and 1500 ccs of yellowish fluid from left pleural space. Had large left-sided pneumothorax afterward with pigtail chest tube placed 9/30 weaning precedex, back on low dose NE, bloody ETT secretions> heparin held  Interim History / Subjective:  Patient was successfully extubated yesterday with the plan not to reintubate again No overnight issues, remained afebrile and off vasopressors  Objective   Blood pressure 111/72, pulse (!) 104, temperature 98.5 F (36.9 C), temperature source Oral, resp.  rate 19, height '5\' 10"'$  (1.778 m),  weight 82.6 kg, SpO2 100 %.    Vent Mode: CPAP;PSV FiO2 (%):  [30 %] 30 % Set Rate:  [12 bmp] 12 bmp Vt Set:  [580 mL] 580 mL PEEP:  [5 cmH20] 5 cmH20 Pressure Support:  [8 cmH20] 8 cmH20 Plateau Pressure:  [17 cmH20] 17 cmH20   Intake/Output Summary (Last 24 hours) at 07/12/2022 0823 Last data filed at 07/12/2022 0700 Gross per 24 hour  Intake 2727.38 ml  Output 2000 ml  Net 727.38 ml   Filed Weights   07/10/22 0214 07/11/22 0434 07/12/22 0401  Weight: 78.8 kg 82 kg 82.6 kg    Physical exam: Physical exam: General: Acute on chronically ill-appearing male, lying on the bed HEENT: [place]/AT, eyes anicteric.  moist mucus membranes. Cortrak in place Neuro: Alert, awake following commands Chest: Coarse breath sounds, no wheezes or rhonchi.  Left-sided pigtail chest tube in place Heart: Tachycardic, regular rhythm, no murmurs or gallops Abdomen: Soft, nontender, nondistended, bowel sounds present Skin: No rash   Resolved Hospital Problem list   Sedation induced hypotension, resolved Hypokalemia  Assessment & Plan:  Acute hypoxemic respiratory failure, multifactorial superimposed pneumonia with ESBL producing Klebsiella  Acute on chronic b/l pleural effusions,  Iatrogenic left Pneumothroax, resolved Acute septic/metabolic encephalopathy Eliquis liver cirrhosis Patient is on meropenem day 7 of 7 He was successfully extubated yesterday, with plan not to reintubate if he starts struggling then will proceed with comfort care Remains on 4 L nasal cannula oxygen, titrate with goal O2 sat 88-92% We will remove left-sided chest tube Mental status has significantly improved  Portal Hypertension Decompensated alcoholic liver cirrhosis, ascites, pleural effusion, volume overload Coagulopathy Bilateral hydrothorax Continue spironolactone Continue lactulose and rifaximin Hold Lasix for now  Paroxysmal Afib, with RVR Prolonged Qtc Nonsustained V.  tach CHADSVASC score 4 Patient converted back to sinus rhythm Switch heparin to Eliquis Change IV amiodarone to p.o. Started on low-dose metoprolol Monitor and supplement electrolytes  Anemia of chronic disease H/H remains stable  Transfuse if hemoglobin less than 7  Hypernatremia, due to diuresis Continue free water flushes Serum sodium remained stable at 148  Malnutrition -severe protein calorie Hyperglycemia, likely stress/tube feed related Continue tube feeds Monitor fingerstick with goal 140-180  Pressure injury stage II left medial buttock - Present on Admit Continue wound care  Best Practice (right click and "Reselect all SmartList Selections" daily)   Diet/type: Tube feeds DVT prophylaxis: LMWH  GI prophylaxis: PPI Lines: Yes needed Foley:  Yes, and it is still needed Code Status:  DNR/DNI Last date of multidisciplinary goals of care discussion [10/2, please Ipal note  LABS    PULMONARY No results for input(s): "PHART", "PCO2ART", "PO2ART", "HCO3", "TCO2", "O2SAT" in the last 168 hours.  Invalid input(s): "PCO2", "PO2"   CBC Recent Labs  Lab 07/10/22 0217 07/11/22 0253 07/12/22 0600  HGB 7.8* 7.7* 8.4*  HCT 25.0* 24.6* 26.3*  WBC 7.7 9.7 9.2  PLT 219 239 217    COAGULATION Recent Labs  Lab 07/09/22 1844  INR 1.2    CARDIAC  No results for input(s): "TROPONINI" in the last 168 hours. No results for input(s): "PROBNP" in the last 168 hours.   CHEMISTRY Recent Labs  Lab 07/08/22 0454 07/08/22 1914 07/09/22 0316 07/09/22 1337 07/09/22 2049 07/10/22 0217 07/11/22 0253  NA 151*   < > 154* 151* 152* 148* 148*  K 2.8*   < > 4.2 4.4 4.2 4.0 4.3  CL 126*   < > 125* 122* 125* 122* 119*  CO2 22   < > '24 24 24 24 26  '$ GLUCOSE 206*   < > 198* 173* 186* 173* 132*  BUN 60*   < > 61* 58* 50* 48* 40*  CREATININE 1.13   < > 0.97 0.93 0.90 0.88 0.97  CALCIUM 7.8*   < > 8.6* 8.7* 8.6* 8.5* 8.4*  MG 2.4  --  2.7*  --  2.7* 2.8* 2.8*  PHOS  --    --   --   --   --  2.6 2.8   < > = values in this interval not displayed.   Estimated Creatinine Clearance: 66.9 mL/min (by C-G formula based on SCr of 0.97 mg/dL).   LIVER Recent Labs  Lab 07/06/22 1000 07/08/22 0454 07/09/22 1844  AST 208* 165*  --   ALT 165* 171*  --   ALKPHOS 255* 200*  --   BILITOT 1.9* 0.9  --   PROT 5.6* 5.2*  --   ALBUMIN 1.9* 1.6*  --   INR  --   --  1.2     INFECTIOUS No results for input(s): "LATICACIDVEN", "PROCALCITON" in the last 168 hours.    ENDOCRINE CBG (last 3)  Recent Labs    07/11/22 2355 07/12/22 0340 07/12/22 0800  GLUCAP 97 103* 111*        Jacky Kindle, MD [facility] See Amion for pager If no response to pager, please call [phone] until 7pm After 7pm, Please call E-link [phone]

## 2022-07-12 NOTE — Progress Notes (Signed)
Attempted to flush cortrak but with resistance. Darrell Horne valve changed, tried to flush with cola  and soaked for few min. but a failure. MD made aware and gave order to hold feeding till tom when cortrak staff comes tom  to check.

## 2022-07-12 NOTE — Progress Notes (Signed)
ANTICOAGULATION CONSULT NOTE  Pharmacy Consult for heparin>eliquis Indication: atrial fibrillation  No Known Allergies  Patient Measurements: Height: '5\' 10"'$  (177.8 cm) Weight: 82.6 kg (182 lb 1.6 oz) IBW/kg (Calculated) : 73 Heparin Dosing Weight: 77 kg  Vital Signs: Temp: 98.5 F (36.9 C) (10/03 0801) Temp Source: Oral (10/03 0801) BP: 104/70 (10/03 1000) Pulse Rate: 105 (10/03 1000)  Labs: Recent Labs     0000 07/09/22 1844 07/09/22 2049 07/10/22 0217 07/10/22 1733 07/11/22 0253 07/11/22 1159 07/12/22 0600  HGB   < >  --   --  7.8*  --  7.7*  --  8.4*  HCT  --   --   --  25.0*  --  24.6*  --  26.3*  PLT  --   --   --  219  --  239  --  217  LABPROT  --  14.9  --   --   --   --   --   --   INR  --  1.2  --   --   --   --   --   --   HEPARINUNFRC  --   --   --   --    < > 0.20* 0.33 0.47  CREATININE  --   --  0.90 0.88  --  0.97  --   --   TROPONINIHS  --   --  16  --   --   --   --   --    < > = values in this interval not displayed.     Estimated Creatinine Clearance: 66.9 mL/min (by C-G formula based on SCr of 0.97 mg/dL).  Assessment: 76 y/o male admitted for altered mental status and bilateral pleural effusions. PMH of atrial fibrillation on eliquis PTA (last dose morning of 9/26). Apixaban held in the setting of increasing LFTs. Bleeding in ET tube has resolved per RN.  Pharmacy was consulted to dose heparin and now transition back to Eliquis given resolution in bleeding, extubation, and no further predicted procedures.    HL 0.47 (therapeutic) Hgb 8.4/Plt 217 (stable)   Goal of Therapy:  Heparin level 0.3-0.7 units/hr Monitor platelets by anticoagulation protocol: Yes   Plan:  D/c heparin infusion Begin Eliquis '5mg'$  by mouth BID (76 y/o; Wt 82 kg; Scr<1)  Stop heparin infusion and give first dose of Eliquis at same time- RN aware of plan F/u CBC, s/sx bleeding   Wilson Singer, PharmD Clinical Pharmacist 07/12/2022 10:55 AM

## 2022-07-12 NOTE — Progress Notes (Signed)
                                                     Palliative Care Progress Note, Assessment & Plan   Patient Name: Darrell Horne       Date: 07/12/2022 DOB: 1946/06/16  Age: 76 y.o. MRN#: [id number] Attending Physician: Jacky Kindle, MD Primary Care Physician: Sandrea Hughs, NP Admit Date: 06/16/2022  Reason for Consultation/Follow-up: Establishing goals of care  Subjective: Pt resting comfortably in the bed. NAD noted. Pt denies any pain, shortness of breath, or discomfort at this time. He is attempting to engage in conversations, however, it is hard to understand exactly what he is saying. No family present at bedside.  HPI: 76 year old male with a past medical history of paroxysmal A-fib on Eliquis, left BKA, HTN, HLD, and alcoholic cirrhosis who presented to the hospital with altered mental status and was admitted on 06/16/2022. He was found to have a large right-sided pleural effusion and his intermittently increased since August 26.  Thoracentesis on 06/17/2022 with 2 L of yellow fluid was found to be transudative pleural effusion. Post procedure developed increased oxygen requirements and hypotension. 06/22/2022 PCCM consulted for evaluation and management of his right-sided pleural effusion and acute hypoxic respiratory failure.   Summary of counseling/coordination of care: After reviewing the patient's chart and assessing the patient at bedside, Darrell Horne is resting comfortably. He is attempting to engage in conversation, however, not able to understand his words. He does nod appropriately to basic "yes/no" questions. No noted overnight issues as he remained afebrile and off vasopressors. Per primary team, patient will likely be transferred out of the ICU soon. PMT will continue to follow peripherally.   Therapeutic silence and active listening provided for  patient to share thoughts and emotions regarding current medical situation.  Emotional support provided.  Code Status: DNR  Prognosis: Unable to determine  Discharge Planning: To Be Determined  Physical Exam Vitals and nursing note reviewed.  Cardiovascular:     Rate and Rhythm: Normal rate and regular rhythm.  Pulmonary:     Effort: Pulmonary effort is normal.  Neurological:     Mental Status: He is alert.     GCS: GCS eye subscore is 4. GCS verbal subscore is 3. GCS motor subscore is 6.     Comments: Hard to decipher pts words.              Palliative Assessment/Data: 20%    Total Time 25 minutes  Greater than 50%  of this time was spent counseling and coordinating care related to the above assessment and plan.  Thank you for allowing the Palliative Medicine Team to assist in the care of this patient.  Tok Ilsa Iha, FNP-BC Palliative Medicine Team Team Phone # [phone]

## 2022-07-12 NOTE — Progress Notes (Signed)
Cortrack is not unclogging. MD aware, and placed order for NG tube placement. NG tube could not be placed successfully. MD notified, and is replacing PO meds to IV that can be replaced. MD instructed RN to monitor BP & MAP, and to alert if MAP is less than 65

## 2022-07-12 NOTE — Progress Notes (Signed)
MD gave order to restart feeding.

## 2022-07-13 ENCOUNTER — Inpatient Hospital Stay (HOSPITAL_COMMUNITY): Payer: Medicare Other

## 2022-07-13 LAB — BASIC METABOLIC PANEL
Anion gap: 7 (ref 5–15)
BUN: 31 mg/dL — ABNORMAL HIGH (ref 8–23)
CO2: 26 mmol/L (ref 22–32)
Calcium: 9.2 mg/dL (ref 8.9–10.3)
Chloride: 112 mmol/L — ABNORMAL HIGH (ref 98–111)
Creatinine, Ser: 0.87 mg/dL (ref 0.61–1.24)
GFR, Estimated: >60 mL/min (ref >60)
Glucose, Bld: 95 mg/dL (ref 70–99)
Potassium: 4.4 mmol/L (ref 3.5–5.1)
Sodium: 145 mmol/L (ref 135–145)

## 2022-07-13 LAB — MAGNESIUM: Magnesium: 2.7 mg/dL — ABNORMAL HIGH (ref 1.7–2.4)

## 2022-07-13 LAB — GLUCOSE, CAPILLARY
Glucose-Capillary: 110 mg/dL — ABNORMAL HIGH (ref 70–99)
Glucose-Capillary: 117 mg/dL — ABNORMAL HIGH (ref 70–99)
Glucose-Capillary: 85 mg/dL (ref 70–99)
Glucose-Capillary: 86 mg/dL (ref 70–99)
Glucose-Capillary: 91 mg/dL (ref 70–99)
Glucose-Capillary: 91 mg/dL (ref 70–99)
Glucose-Capillary: 95 mg/dL (ref 70–99)

## 2022-07-13 LAB — CBC
HCT: 26.2 % — ABNORMAL LOW (ref 39.0–52.0)
Hemoglobin: 8.1 g/dL — ABNORMAL LOW (ref 13.0–17.0)
MCH: 33.1 pg (ref 26.0–34.0)
MCHC: 30.9 g/dL (ref 30.0–36.0)
MCV: 106.9 fL — ABNORMAL HIGH (ref 80.0–100.0)
Platelets: 167 10*3/uL (ref 150–400)
RBC: 2.45 MIL/uL — ABNORMAL LOW (ref 4.22–5.81)
RDW: 16.5 % — ABNORMAL HIGH (ref 11.5–15.5)
WBC: 9.1 10*3/uL (ref 4.0–10.5)
nRBC: 0 % (ref 0.0–0.2)

## 2022-07-13 LAB — AMMONIA: Ammonia: 26 umol/L (ref 9–35)

## 2022-07-13 MED ORDER — METOPROLOL TARTRATE 12.5 MG HALF TABLET
12.5000 mg | ORAL_TABLET | Freq: Three times a day (TID) | ORAL | Status: DC
  Administered 2022-07-13 – 2022-07-15 (×4): 12.5 mg via ORAL
  Filled 2022-07-13 (×5): qty 1

## 2022-07-13 MED ORDER — ENSURE ENLIVE PO LIQD
237.0000 mL | Freq: Three times a day (TID) | ORAL | Status: DC
  Administered 2022-07-13 – 2022-07-15 (×4): 237 mL via ORAL

## 2022-07-13 MED ORDER — THIAMINE MONONITRATE 100 MG PO TABS
100.0000 mg | ORAL_TABLET | Freq: Every day | ORAL | Status: DC
  Administered 2022-07-14 – 2022-07-15 (×2): 100 mg via ORAL
  Filled 2022-07-13 (×2): qty 1

## 2022-07-13 MED ORDER — SPIRONOLACTONE 25 MG PO TABS
100.0000 mg | ORAL_TABLET | Freq: Every day | ORAL | Status: DC
  Administered 2022-07-14 – 2022-07-15 (×2): 100 mg via ORAL
  Filled 2022-07-13 (×2): qty 4

## 2022-07-13 MED ORDER — RIFAXIMIN 550 MG PO TABS
550.0000 mg | ORAL_TABLET | Freq: Two times a day (BID) | ORAL | Status: DC
  Administered 2022-07-13 – 2022-07-15 (×4): 550 mg via ORAL
  Filled 2022-07-13 (×5): qty 1

## 2022-07-13 MED ORDER — LACTULOSE 10 GM/15ML PO SOLN
20.0000 g | Freq: Two times a day (BID) | ORAL | Status: DC
  Administered 2022-07-13 – 2022-07-14 (×4): 20 g via ORAL
  Filled 2022-07-13 (×5): qty 30

## 2022-07-13 MED ORDER — FOLIC ACID 1 MG PO TABS
1.0000 mg | ORAL_TABLET | Freq: Every day | ORAL | Status: DC
  Administered 2022-07-13 – 2022-07-15 (×3): 1 mg via ORAL
  Filled 2022-07-13 (×3): qty 1

## 2022-07-13 MED ORDER — AMIODARONE HCL 200 MG PO TABS
200.0000 mg | ORAL_TABLET | Freq: Two times a day (BID) | ORAL | Status: DC
  Administered 2022-07-13 – 2022-07-15 (×4): 200 mg via ORAL
  Filled 2022-07-13 (×4): qty 1

## 2022-07-13 MED ORDER — APIXABAN 5 MG PO TABS
5.0000 mg | ORAL_TABLET | Freq: Two times a day (BID) | ORAL | Status: DC
  Administered 2022-07-13 – 2022-07-15 (×4): 5 mg
  Filled 2022-07-13 (×4): qty 1

## 2022-07-13 MED ORDER — MIDODRINE HCL 5 MG PO TABS
10.0000 mg | ORAL_TABLET | Freq: Three times a day (TID) | ORAL | Status: DC
  Administered 2022-07-13 – 2022-07-15 (×5): 10 mg via ORAL
  Filled 2022-07-13 (×6): qty 2

## 2022-07-13 MED ORDER — HEPARIN (PORCINE) 25000 UT/250ML-% IV SOLN
1600.0000 [IU]/h | INTRAVENOUS | Status: AC
  Administered 2022-07-13: 1600 [IU]/h via INTRAVENOUS
  Filled 2022-07-13: qty 250

## 2022-07-13 NOTE — Progress Notes (Signed)
Notified of frequent ectopy on monitor and now in a fib with HR sustaining 120-130s.   Metoprolol had been stopped last night d/t low BP. Current BP is 117/64. Plan to resume metoprolol with holding parameters.

## 2022-07-13 NOTE — Progress Notes (Signed)
Patient with approx 2 min run of VT noted on monitor. RN to room to try and obtain 12 lead EKG but  was unable due to patient converted back to Afib. Patient asymptomatic-looking around in room answering simple questions with one word answers. Dr. Starla Link made aware -states patient already on oral amio, cardiology signed off and to just monitor patient for now. Report Given to oncoming RN Caryl Pina.

## 2022-07-13 NOTE — Progress Notes (Signed)
Modified Barium Swallow Progress Note  Patient Details  Name: Darrell Horne MRN: [id number] Date of Birth: 1946-03-30  Today's Date: 07/13/2022  Modified Barium Swallow completed.  Full report located under Chart Review in the Imaging Section.  Brief recommendations include the following:  Clinical Impression  Pt demonstrates realatively functional swallow ability with some age related changes. Pt able to manage boluses orally with some anterior spillage with cup sips given total assisted feeding and incomlete lip seal to cup edge. Pt with brief bolus hold with most sips. Swallow timely though there was slight high penetration during the swallow at times that was ejected with subsequent swallows. Pt able to masticate graham cracker with gums with extra time. No significant pharyngeal weakness, only trace coating or lingering stasis that cleared with a second swallow. Pt may resume a dys 2 diet with thin liquids with basic aspiration precautions. Will f/u once for tolerance.   Swallow Evaluation Recommendations       SLP Diet Recommendations: Dysphagia 2 (Fine chop) solids;Thin liquid   Liquid Administration via: Cup;Straw       Supervision: Staff to assist with self feeding   Compensations: Slow rate;Small sips/bites   Postural Changes: Remain semi-upright after after feeds/meals (Comment)          Herbie Baltimore, MA CCC-SLP  Acute Rehabilitation Services Secure Chat Preferred Office [phone]   Reesha Debes, Katherene Ponto 07/13/2022,10:35 AM

## 2022-07-13 NOTE — Progress Notes (Addendum)
PROGRESS NOTE    Darrell Horne  SPQ:[id number] DOB: 27-Oct-1945 DOA: 06/16/2022 PCP: Sandrea Hughs, NP   Brief Narrative:  76 y.o. male with medical history significant for paroxysmal atrial fibrillation chronically anticoagulated on Eliquis, status post left BKA, GERD, possible alcoholic cirrhosis, essential hypertension, hyperlipidemia, recent hospitalization for encephalopathy secondary to unintentional overdose and subsequently discharged to SNF presented to the hospital with altered mental status and hypoxia from skilled nursing facility.  He was found to have moderate to large right-sided pleural effusion; CT of the head was negative.  He has had a long hospitalization.  Due to worsening respiratory status, PCCM had to be consulted.  He needed transfer to ICU.  He was treated with diuresis along with thoracentesis and bilateral chest tube placement and subsequent removal of chest tube.  On 07/01/2022, he developed progressive confusion; started on broad-spectrum antibiotics and he was subsequently intubated on 07/04/2022 and transferred to ICU care.  He had bilateral thoracentesis on 07/05/2022.  He also had A-fib with RVR requiring cardiology consultation and amiodarone.  He was subsequently extubated on 07/11/2022.  CODE STATUS has been changed to DNR.  Palliative care has also been involved in conversation with family.  Significant Hospital events: 9/8 thoracentesis with return of 1.2 L of transudative fluid 06/18/2022: Stage II partial-thickness left medial buttock pressure injury noted low 9/12 increasing oxygen requirements with up to 6 L high flow nasal cannula and episodes of hypotension with systolic of 89.  Albumin, midodrine, and tube feeds started.  Core track placed. 9/13 PCCM consulted, Eliquis held for planned thoracentesis tomorrow. CXR with stable pulm edema and pleural effusions 9/14 increased O2 requirements, stable on BiPAP now. Bilateral chest tubes placed with return of 6 L of  yellow fluid. Off BiPAP 9/15 doing well off BiPAP overnight, on 5L [place], slowed drainage from bilat chest tubes, drastically improved peripheral edema.  9/16 off bipap. Remains off pressors. Stable to transfer out of ICU  9/19 - Lactulose held due to diarrhea . More responsive today. Bilateral chest tubes clamped since 9/18.  Transudative pleural effusion deemed due to cirrhosis and cardiac impairment.  Removal of chest tubes.  Oxygen level 96% on room air.  With diuresis he is -18 L.  Delirium thought to be slowly improving ammonia level 20.  Lactulose is being continued.  For A-fib oral amiodarone and oral metoprolol continued with Eliquis.  Being maintained on midodrine as well.   Critical care medicine signed off 06/29/2022: Continue diuresis.  Improved alertness noticed. 07/01/2022: Worsening confusion but also fluctuating confusion.  Continue diuresis. 07/02/2022: Lethargic worsening confusion.  Afebrile per hospitalist 07/03/2022: PCCM Consulted for confusion and respiratory distress, transferred to ICU 9/25: Intubated  9/26: Bilateral thoracentesis w/ 1200 cc's serosnanguinous fluid drained from right pleural space and 1500 ccs of yellowish fluid from left pleural space. Had large left-sided pneumothorax afterward with pigtail chest tube placed 9/30 weaning precedex, back on low dose NE, bloody ETT secretions> heparin held 10/2: Extubated. Pigtail chest removed. 10/4: Transferred back to [place]:   Acute respiratory failure with hypoxia, multifactorial superimposed pneumonia with ESBL Klebsiella Acute on chronic bilateral pleural effusions Iatrogenic left pneumothorax, resolved -Patient has had a long hospitalization as described above requiring repeated thoracentesis; chest tubes placement and subsequent removal -Extubated on 07/11/2022 with plans for no intubation if condition worsens and to proceed with comfort measures if condition worsens.  Left-sided chest tube was  removed on 07/12/2022. -Currently on 3 L oxygen via  nasal cannula.  Acute metabolic encephalopathy -Possibly also has a component of hepatic encephalopathy -Monitor mental status.  Continue lactulose. -Fall precaution.  Delirium precautions.  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  Resume rifaximin and spironolactone.  Lasix on hold for now because of blood pressure being on the lower side.  Paroxysmal A-fib with RVR Prolonged Qtc Nonsustained V. Tach -Eliquis has been switched back to heparin drip overnight because of Cortrak tube being dislodged.  Patient was evaluated by SLP this morning and diet has been recommended.  We will switch back to oral Eliquis.  Resume oral amiodarone.  Monitor to resume metoprolol if needed. -Outpatient follow-up with cardiology.  Severe protein calorie malnutrition -Follow nutrition recommendations  Anemia of chronic disease -Hemoglobin stable.  Transfuse if hemoglobin less than 7  Hypernatremia -Improved.  Monitor.  Stage II left medial buttock pressure injury: Present on admission -Continue wound care  Generalized deconditioning Goals of care -Palliative care following.  Currently DNR.  If condition worsens, patient may benefit from comfort measures.   DVT prophylaxis: Heparin Code Status: DNR Family Communication: Wife at bedside Disposition Plan: Status is: Inpatient Remains inpatient appropriate because: Of severity of illness  Consultants: PCCM/cardiology/palliative care  Procedures: As above  Antimicrobials:  Anti-infectives (From admission, onward)    Start     Dose/Rate Route Frequency Ordered Stop   07/07/22 1300  vancomycin (VANCOREADY) IVPB 1500 mg/300 mL  Status:  Discontinued        1,500 mg 150 mL/hr over 120 Minutes Intravenous Every 24 hours 07/06/22 1354 07/07/22 1022    07/06/22 1430  meropenem (MERREM) 2 g in sodium chloride 0.9 % 100 mL IVPB        2 g 280 mL/hr over 30 Minutes Intravenous Every 8 hours 07/06/22 1339 07/13/22 1359   07/06/22 1300  vancomycin (VANCOREADY) IVPB 1500 mg/300 mL        1,500 mg 150 mL/hr over 120 Minutes Intravenous  Once 07/06/22 1202 07/06/22 1506   07/05/22 1115  vancomycin (VANCOREADY) IVPB 1500 mg/300 mL  Status:  Discontinued        1,500 mg 150 mL/hr over 120 Minutes Intravenous Every 24 hours 07/04/22 1016 07/05/22 0824   07/04/22 1100  vancomycin (VANCOREADY) IVPB 1500 mg/300 mL        1,500 mg 150 mL/hr over 120 Minutes Intravenous  Once 07/04/22 1014 07/04/22 1633   07/04/22 1000  rifaximin (XIFAXAN) tablet 550 mg  Status:  Discontinued        550 mg Per Tube 2 times daily 07/04/22 0839 07/12/22 2255   07/04/22 0900  piperacillin-tazobactam (ZOSYN) IVPB 3.375 g  Status:  Discontinued        3.375 g 12.5 mL/hr over 240 Minutes Intravenous Every 8 hours 07/04/22 0802 07/06/22 1339   07/03/22 1300  vancomycin (VANCOCIN) IVPB 1000 mg/200 mL premix  Status:  Discontinued        1,000 mg 200 mL/hr over 60 Minutes Intravenous Every 12 hours 07/03/22 0004 07/03/22 0739   07/03/22 0900  ceFEPIme (MAXIPIME) 2 g in sodium chloride 0.9 % 100 mL IVPB  Status:  Discontinued        2 g 200 mL/hr over 30 Minutes Intravenous Every 8 hours 07/03/22 0004 07/04/22 0802   07/03/22 0100  vancomycin (VANCOREADY) IVPB 1500 mg/300 mL        1,500 mg 150 mL/hr over 120 Minutes Intravenous  Once 07/03/22  0004 07/03/22 0415   07/03/22 0100  ceFEPIme (MAXIPIME) 2 g in sodium chloride 0.9 % 100 mL IVPB        2 g 200 mL/hr over 30 Minutes Intravenous  Once 07/03/22 0004 07/03/22 0204   06/25/22 1200  cefTRIAXone (ROCEPHIN) 1 g in sodium chloride 0.9 % 100 mL IVPB  Status:  Discontinued        1 g 200 mL/hr over 30 Minutes Intravenous Every 24 hours 06/25/22 1131 06/29/22 1010   06/23/22 1400  ceFEPIme (MAXIPIME) 2 g in sodium chloride 0.9  % 100 mL IVPB  Status:  Discontinued        2 g 200 mL/hr over 30 Minutes Intravenous Every 8 hours 06/23/22 1045 06/25/22 1131   06/23/22 1000  ceFEPIme (MAXIPIME) 1 g in sodium chloride 0.9 % 100 mL IVPB  Status:  Discontinued        1 g 200 mL/hr over 30 Minutes Intravenous Every 12 hours 06/23/22 0529 06/23/22 1045   06/23/22 1000  linezolid (ZYVOX) tablet 600 mg  Status:  Discontinued        600 mg Per Tube Every 12 hours 06/23/22 0529 06/24/22 1100   06/22/22 1145  cefTRIAXone (ROCEPHIN) 1 g in sodium chloride 0.9 % 100 mL IVPB  Status:  Discontinued        1 g 200 mL/hr over 30 Minutes Intravenous Every 24 hours 06/22/22 1055 06/23/22 0529   06/22/22 1145  azithromycin (ZITHROMAX) 500 mg in sodium chloride 0.9 % 250 mL IVPB  Status:  Discontinued        500 mg 250 mL/hr over 60 Minutes Intravenous Every 24 hours 06/22/22 1055 06/23/22 0529        Subjective: Patient seen and examined at bedside.  Very firm, slow to respond.  Wife at bedside.  No fever, vomiting, agitation reported by nursing staff. Cortrak tube got clogged overnight and had to be removed.  Objective: Vitals:   07/12/22 1900 07/13/22 0040 07/13/22 0604 07/13/22 0832  BP: (!) 92/59 103/60 96/60 104/65  Pulse: 98     Resp: (!) _0 Temp:  98.4 F (36.9 C) 98.5 F (36.9 C) 98.5 F (36.9 C)  TempSrc:  Oral Oral Oral  SpO2: 100%     Weight:      Height:        Intake/Output Summary (Last 24 hours) at 07/13/2022 1442 Last data filed at 07/13/2022 0527 Gross per 24 hour  Intake 638.55 ml  Output 1900 ml  Net -1261.45 ml   Filed Weights   07/10/22 0214 07/11/22 0434 07/12/22 0401  Weight: 78.8 kg 82 kg 82.6 kg    Examination:  General exam: Appears calm and comfortable.  Chronically ill and deconditioned.  Currently on 2 L oxygen via nasal cannula. Respiratory system: Bilateral decreased breath sounds at bases with scattered crackles and intermittent tachycardia Cardiovascular system: S1 & S2  heard, Rate controlled Gastrointestinal system: Abdomen is distended, soft and nontender. Normal bowel sounds heard. Extremities: No cyanosis, clubbing; Left BKA present Central nervous system: Still slow to respond, awake.  Poor historian.  No focal neurological deficits. Moving extremities Skin: No rashes, lesions or ulcers Psychiatry: Flat affect.  No signs of agitation.   Data Reviewed: I have personally reviewed following labs and imaging studies  CBC: Recent Labs  Lab 07/09/22 0316 07/10/22 0217 07/11/22 0253 07/12/22 0600 07/13/22 0555  WBC 10.4 7.7 9.7 9.2 9.1  HGB 8.7* 7.8* 7.7* 8.4* 8.1*  HCT 27.0* 25.0* 24.6* 26.3* 26.2*  MCV 104.2* 105.9* 106.0* 104.0* 106.9*  PLT 264 219 239 217 696   Basic Metabolic Panel: Recent Labs  Lab 07/09/22 1337 07/09/22 2049 07/10/22 0217 07/11/22 0253 07/12/22 0600 07/13/22 0555  NA 151* 152* 148* 148*  --  145  K 4.4 4.2 4.0 4.3  --  4.4  CL 122* 125* 122* 119*  --  112*  CO2 _0 --  26  GLUCOSE 173* 186* 173* 132*  --  95  BUN 58* 50* 48* 40*  --  31*  CREATININE 0.93 0.90 0.88 0.97  --  0.87  CALCIUM 8.7* 8.6* 8.5* 8.4*  --  9.2  MG  --  2.7* 2.8* 2.8* 2.7* 2.7*  PHOS  --   --  2.6 2.8 2.7  --    GFR: Estimated Creatinine Clearance: 74.6 mL/min (by C-G formula based on SCr of 0.87 mg/dL). Liver Function Tests: Recent Labs  Lab 07/08/22 0454  AST 165*  ALT 171*  ALKPHOS 200*  BILITOT 0.9  PROT 5.2*  ALBUMIN 1.6*   No results for input(s): "LIPASE", "AMYLASE" in the last 168 hours. Recent Labs  Lab 07/09/22 1337 07/13/22 0555  AMMONIA 24 26   Coagulation Profile: Recent Labs  Lab 07/09/22 1844  INR 1.2   Cardiac Enzymes: No results for input(s): "CKTOTAL", "CKMB", "CKMBINDEX", "TROPONINI" in the last 168 hours. BNP (last 3 results) No results for input(s): "PROBNP" in the last 8760 hours. HbA1C: No results for input(s): "HGBA1C" in the last 72 hours. CBG: Recent Labs  Lab 07/12/22 2105  07/13/22 0100 07/13/22 0556 07/13/22 0858 07/13/22 1150  GLUCAP 101* 91 95 91 110*   Lipid Profile: No results for input(s): "CHOL", "HDL", "LDLCALC", "TRIG", "CHOLHDL", "LDLDIRECT" in the last 72 hours. Thyroid Function Tests: No results for input(s): "TSH", "T4TOTAL", "FREET4", "T3FREE", "THYROIDAB" in the last 72 hours. Anemia Panel: No results for input(s): "VITAMINB12", "FOLATE", "FERRITIN", "TIBC", "IRON", "RETICCTPCT" in the last 72 hours. Sepsis Labs: No results for input(s): "PROCALCITON", "LATICACIDVEN" in the last 168 hours.  Recent Results (from the past 240 hour(s))  Respiratory (~20 pathogens) panel by PCR     Status: None   Collection Time: 07/03/22  8:30 PM   Specimen: Nasopharyngeal Swab; Respiratory  Result Value Ref Range Status   Adenovirus NOT DETECTED NOT DETECTED Final   Coronavirus 229E NOT DETECTED NOT DETECTED Final    Comment: (NOTE) The Coronavirus on the Respiratory Panel, DOES NOT test for the novel  Coronavirus (2019 nCoV)    Coronavirus HKU1 NOT DETECTED NOT DETECTED Final   Coronavirus NL63 NOT DETECTED NOT DETECTED Final   Coronavirus OC43 NOT DETECTED NOT DETECTED Final   Metapneumovirus NOT DETECTED NOT DETECTED Final   Rhinovirus / Enterovirus NOT DETECTED NOT DETECTED Final   Influenza A NOT DETECTED NOT DETECTED Final   Influenza B NOT DETECTED NOT DETECTED Final   Parainfluenza Virus 1 NOT DETECTED NOT DETECTED Final   Parainfluenza Virus 2 NOT DETECTED NOT DETECTED Final   Parainfluenza Virus 3 NOT DETECTED NOT DETECTED Final   Parainfluenza Virus 4 NOT DETECTED NOT DETECTED Final   Respiratory Syncytial Virus NOT DETECTED NOT DETECTED Final   Bordetella pertussis NOT DETECTED NOT DETECTED Final   Bordetella Parapertussis NOT DETECTED NOT DETECTED Final   Chlamydophila pneumoniae NOT DETECTED NOT DETECTED Final   Mycoplasma pneumoniae NOT DETECTED NOT DETECTED Final    Comment: Performed at [facility], [place] [place]  [address]  [place], [place] [address]  Culture, Respiratory w Gram Stain     Status: None   Collection Time: 07/04/22 12:38 PM   Specimen: Bronchoalveolar Lavage; Respiratory  Result Value Ref Range Status   Specimen Description BRONCHIAL ALVEOLAR LAVAGE  Final   Special Requests NONE  Final   Gram Stain NO WBC SEEN NO ORGANISMS SEEN   Final   Culture   Final    FEW KLEBSIELLA PNEUMONIAE FEW ENTEROCOCCUS FAECALIS FEW STAPHYLOCOCCUS AUREUS Confirmed Extended Spectrum Beta-Lactamase Producer (ESBL).  In bloodstream infections from ESBL organisms, carbapenems are preferred over piperacillin/tazobactam. They are shown to have a lower risk of mortality. FOR KLEBSIELLA PNEUMONIAE Performed at [facility], [place] [address] [place], [place] [address]    Report Status 07/07/2022 FINAL  Final   Organism ID, Bacteria KLEBSIELLA PNEUMONIAE  Final   Organism ID, Bacteria ENTEROCOCCUS FAECALIS  Final   Organism ID, Bacteria STAPHYLOCOCCUS AUREUS  Final      Susceptibility   Enterococcus faecalis - MIC*    AMPICILLIN <=2 SENSITIVE Sensitive     VANCOMYCIN 1 SENSITIVE Sensitive     GENTAMICIN SYNERGY SENSITIVE Sensitive     * FEW ENTEROCOCCUS FAECALIS   Klebsiella pneumoniae - MIC*    AMPICILLIN >=32 RESISTANT Resistant     CEFAZOLIN >=64 RESISTANT Resistant     CEFEPIME 8 INTERMEDIATE Intermediate     CEFTAZIDIME RESISTANT Resistant     CEFTRIAXONE >=64 RESISTANT Resistant     CIPROFLOXACIN 0.5 INTERMEDIATE Intermediate     GENTAMICIN >=16 RESISTANT Resistant     IMIPENEM <=0.25 SENSITIVE Sensitive     TRIMETH/SULFA >=320 RESISTANT Resistant     AMPICILLIN/SULBACTAM >=32 RESISTANT Resistant     PIP/TAZO <=4 SENSITIVE Sensitive     * FEW KLEBSIELLA PNEUMONIAE   Staphylococcus aureus - MIC*    CIPROFLOXACIN <=0.5 SENSITIVE Sensitive     ERYTHROMYCIN RESISTANT Resistant     GENTAMICIN <=0.5 SENSITIVE Sensitive     OXACILLIN 0.5 SENSITIVE Sensitive     TETRACYCLINE <=1 SENSITIVE Sensitive      VANCOMYCIN <=0.5 SENSITIVE Sensitive     TRIMETH/SULFA <=10 SENSITIVE Sensitive     CLINDAMYCIN RESISTANT Resistant     RIFAMPIN <=0.5 SENSITIVE Sensitive     Inducible Clindamycin POSITIVE Resistant     * FEW STAPHYLOCOCCUS AUREUS         Radiology Studies: No results found.      Scheduled Meds:  Chlorhexidine Gluconate Cloth  6 each Topical Daily   insulin aspart  0-9 Units Subcutaneous Q4H   lactulose  20 g Oral BID   mupirocin cream   Topical Daily   mouth rinse  15 mL Mouth Rinse Q2H   Continuous Infusions:  sodium chloride Stopped (07/08/22 2112)   feeding supplement (OSMOLITE 1.5 CAL) Stopped (07/12/22 1308)   heparin 1,600 Units/hr (07/13/22 0527)   lactated ringers 80 mL/hr at 07/13/22 1417          Jama Krichbaum Starla Link, MD [facility] 07/13/2022, 2:42 PM

## 2022-07-13 NOTE — Evaluation (Signed)
Physical Therapy Evaluation Patient Details Name: Darrell Horne MRN: [id number] DOB: 01-09-1946 Today's Date: 07/13/2022  History of Present Illness  Pt is a 76 y.o. male admitted from [facility] SNF on 06/16/22 with AMS; workup for acute encephalopathy and respiratory failure. CXR with pleural effusion, s/p thoracentesis 9/8. Increasing O2 requirements 9/12, cortrak placed. BiPAP 9/14. and chest tubes placed. Course complicated by worsening confusion and fluctuating respiratory distress. Bilateral thoracentesis 9/26; large PTX with pigtail chest tube placement. ETT 9/25-10/2. PMH includes chronic A-fib on Eliquis, DM, HTN, peripheral neuropathy, left BKA; of note, recent admission 8/25-9/1 for acute toxic encephalopathy-drug toxicity/polypharmacy overdosing.   Clinical Impression  Pt presents with an overall decrease in functional mobility secondary to above. PTA, pt working with therapies at [facility], had been standing on new LLE prosthetic. Pt last worked with acute PT this admission on 07/01/22 requiring maxA-totalA for bed mobility, then order cancelled secondary to worsening medical status. Today, pt requiring totalA+2 for bed mobility; pt only oriented to self (also able to state wife's name), but otherwise pleasantly confused, not consistently following simple commands. Will trial acute PT services to maximize functional mobility and decrease caregiver burden prior to return to SNF.   Recommendations for follow up therapy are one component of a multi-disciplinary discharge planning process, led by the attending physician.  Recommendations may be updated based on patient status, additional functional criteria and insurance authorization.  Follow Up Recommendations Skilled nursing-short term rehab (<3 hours/day) Can patient physically be transported by private vehicle: No    Assistance Recommended at Discharge Frequent or constant Supervision/Assistance  Patient can return home with the  following  Two people to help with walking and/or transfers;A lot of help with bathing/dressing/bathroom;Assistance with feeding;Assist for transportation    Equipment Recommendations  (defer to next venue)  Recommendations for Other Services       Functional Status Assessment Patient has had a recent decline in their functional status and demonstrates the ability to make significant improvements in function in a reasonable and predictable amount of time.     Precautions / Restrictions Precautions Precautions: Fall;Other (comment) Precaution Comments: flexiseal; h/o L BKA (has prosthesis at SNF)      Mobility  Bed Mobility Overal bed mobility: Needs Assistance Bed Mobility: Rolling Rolling: Total assist, +2 for physical assistance         General bed mobility comments: pt not following directions to roll despite physical assist to attempt movement initiation, totalA+2 for rolling R/L for pericare and linen change; pt minimally assisting with LUE on bed rail    Transfers                   General transfer comment: unable this session - suspect pt would require maxisky lift OOB    Ambulation/Gait                  Stairs            Wheelchair Mobility    Modified Rankin (Stroke Patients Only)       Balance       Sitting balance - Comments: pt not initiating anterior weight shift with attempts at forward leans from elevated HOB despite maxA                                     Pertinent Vitals/Pain Pain Assessment Pain Assessment: Faces Faces Pain Scale: Hurts a  little bit Pain Location: grimacing with repositioning, though pt denies pain when initially asked Pain Descriptors / Indicators: Grimacing Pain Intervention(s): Monitored during session, Repositioned    Home Living Family/patient expects to be discharged to:: Skilled nursing facility                        Prior Function Prior Level of Function : Needs  assist;History of Falls (last six months)             Mobility Comments: per OT eval note 06/21/22, was working with therapies at [facility], standing in LLE prosthesis in room       Hand Dominance        Extremity/Trunk Assessment   Upper Extremity Assessment Upper Extremity Assessment: Generalized weakness;RUE deficits/detail;LUE deficits/detail RUE Deficits / Details: noted edema RUE; pt tolerated shoulder flex/abd PROM to ~90' but AROM less than this; able to bring washcloth to face LUE Deficits / Details: pt tolerated shoulder flex/abd PROM to ~90' but AROM <90' with noted weakness; tremulous when attempting to hand therapist washcloth    Lower Extremity Assessment Lower Extremity Assessment: RLE deficits/detail;LLE deficits/detail RLE Deficits / Details: observed functional strength <3/5; noted RLE swelling with wound on thigh (suspect related to foley cath anchor? and edema around where R calf BP cuff on, removed - RN aware) LLE Deficits / Details: h/o L BKA; resting with knee flexion but able to actively achieve near full extension    Cervical / Trunk Assessment Cervical / Trunk Assessment: Kyphotic  Communication   Communication: Expressive difficulties (question expressive/receptive difficulties? clear speech, but inconsistently answering questions)  Cognition Arousal/Alertness: Awake/alert Behavior During Therapy: Flat affect Overall Cognitive Status: No family/caregiver present to determine baseline cognitive functioning Area of Impairment: Orientation, Memory, Safety/judgement, Following commands, Problem solving, Awareness, Attention                 Orientation Level: Disoriented to, Place, Time, Situation Current Attention Level: Focused Memory: Decreased recall of precautions, Decreased short-term memory Following Commands: Follows one step commands inconsistently, Follows one step commands with increased time Safety/Judgement: Decreased awareness  of safety, Decreased awareness of deficits Awareness: Intellectual Problem Solving: Slow processing, Difficulty sequencing, Decreased initiation, Requires verbal cues, Requires tactile cues General Comments: awake/alert but inconsistent responses to questions - able to state his name and wife's name, otherwise not answering questions        General Comments General comments (skin integrity, edema, etc.): pt received with O2 [place] in mouth and SpO2 100%; O2 [place] doffed, SpO2 maintaining >/98%, RN aware. noted flexiseal leaking, RN aware; dependent for pericare/washup    Exercises     Assessment/Plan    PT Assessment Patient needs continued PT services  PT Problem List Decreased strength;Decreased mobility;Decreased safety awareness;Decreased activity tolerance;Decreased cognition;Cardiopulmonary status limiting activity;Decreased balance;Decreased knowledge of use of DME       PT Treatment Interventions DME instruction;Therapeutic activities;Therapeutic exercise;Patient/family education;Balance training;Wheelchair mobility training;Functional mobility training;Neuromuscular re-education    PT Goals (Current goals can be found in the Care Plan section)  Acute Rehab PT Goals Patient Stated Goal: per wife (06/24/22), wife reports plan for rehab with transition to long term care as pt not able to manage at home PT Goal Formulation: With family Time For Goal Achievement: 07/27/22 Potential to Achieve Goals: Poor    Frequency Min 2X/week     Co-evaluation               AM-PAC PT "6 Clicks" Mobility  Outcome Measure Help needed turning from your back to your side while in a flat bed without using bedrails?: Total Help needed moving from lying on your back to sitting on the side of a flat bed without using bedrails?: Total Help needed moving to and from a bed to a chair (including a wheelchair)?: Total Help needed standing up from a chair using your arms (e.g., wheelchair or bedside  chair)?: Total Help needed to walk in hospital room?: Total Help needed climbing 3-5 steps with a railing? : Total 6 Click Score: 6    End of Session   Activity Tolerance: Patient limited by fatigue Patient left: in bed;with call bell/phone within reach;with bed alarm set Nurse Communication: Mobility status;Need for lift equipment PT Visit Diagnosis: Other abnormalities of gait and mobility (R26.89);Muscle weakness (generalized) (M62.81)    Time: 8343-7357 PT Time Calculation (min) (ACUTE ONLY): 24 min   Charges:   PT Evaluation $PT Eval Moderate Complexity: [place], PT, DPT Acute Rehabilitation Services  Personal: [place] [facility] Office: [place] 07/13/2022, 5:46 PM

## 2022-07-13 NOTE — Plan of Care (Signed)
  Problem: Clinical Measurements: Goal: Respiratory complications will improve Outcome: Progressing   Problem: Coping: Goal: Level of anxiety will decrease Outcome: Progressing   Problem: Elimination: Goal: Will not experience complications related to bowel motility Outcome: Progressing Goal: Will not experience complications related to urinary retention Outcome: Progressing   Problem: Clinical Measurements: Goal: Cardiovascular complication will be avoided Outcome: Not Progressing   Problem: Activity: Goal: Risk for activity intolerance will decrease Outcome: Not Progressing

## 2022-07-13 NOTE — Progress Notes (Signed)
Pt continues with frequent long runs of VT.  Patient asymptomatic. Amiodarone already given early.  Dr. Myna Hidalgo  notified. No new orders.

## 2022-07-13 NOTE — Progress Notes (Signed)
[place] for heparin ->apixaban Indication: atrial fibrillation  No Known Allergies  Patient Measurements: Height: '5\' 10"'$  (177.8 cm) Weight: 82.6 kg (182 lb 1.6 oz) IBW/kg (Calculated) : 73 Heparin Dosing Weight: 83 kg  Vital Signs: Temp: 98.5 F (36.9 C) (10/04 0832) Temp Source: Oral (10/04 0832) BP: 104/65 (10/04 0832)  Labs: Recent Labs    07/11/22 0253 07/11/22 1159 07/12/22 0600 07/13/22 0555  HGB 7.7*  --  8.4* 8.1*  HCT 24.6*  --  26.3* 26.2*  PLT 239  --  217 167  HEPARINUNFRC 0.20* 0.33 0.47  --   CREATININE 0.97  --   --  0.87     Estimated Creatinine Clearance: 74.6 mL/min (by C-G formula based on SCr of 0.87 mg/dL).   Medical History: Past Medical History:  Diagnosis Date   Chest pain    Diabetes mellitus    DM2 (diabetes mellitus, type 2) (HCC)    GERD (gastroesophageal reflux disease)    HLD (hyperlipidemia)    HTN (hypertension)    PAF (paroxysmal atrial fibrillation) (HCC)    Palpitations     Goal of Therapy:  aPTT 66-102 seconds Monitor platelets by anticoagulation protocol: Yes   Assessment/Plan: 76 y/o male admitted on 06/16/22 for altered mental status and hepatic hydrothorax. Patient had been transitioned 10/3 from heparin to Eliquis for PAF but cortrak clogged so back to heparin 10/4 a.m. Pt now able to take po meds so changing back to apixaban.  Plan: Restart apixaban '5mg'$  po BID Will stop heparin when first dose given at 1800 today  Sherlon Handing, PharmD, BCPS Please see amion for complete clinical pharmacist phone list 07/13/2022,3:16 PM

## 2022-07-13 NOTE — Progress Notes (Signed)
Nutrition Follow-up  DOCUMENTATION CODES:   Severe malnutrition in context of chronic illness  INTERVENTION:    If within [place], recommend replacing Cortrak tube on 10/05 and resuming enteral feeds. Recommend nocturnal tube feeds given severity of malnutrition and high calorie and protein needs related to wound healing. Recommend: - Osmolite 1.5 @ 75 ml/hr x 12 hours from 1800 to 0600 (total of 900 ml) - PROSource TF20 60 ml daily  Recommended nocturnal tube feeding regimen would provide 1430 kcal, 76 grams of protein, and 686 ml of H2O (meeting 72% of kcal needs and 76% of protein needs).  - Ensure Enlive po TID, each supplement provides 350 kcal and 20 grams of protein  NUTRITION DIAGNOSIS:   Severe Malnutrition (in the context of chronic illness) related to poor appetite as evidenced by severe muscle depletion, severe fat depletion.  Ongoing, being addressed via diet advancement and supplements  GOAL:   Patient will meet greater than or equal to 90% of their needs  Unmet at this time  MONITOR:   Weight trends, Vent status, Labs, I & O's  REASON FOR ASSESSMENT:   Consult Assessment of nutrition requirement/status, Enteral/tube feeding initiation and management  ASSESSMENT:   Pt with hx of A.fib, Left BKA, GERD, DM type 2, HTN, and HLD presented to ED from SNF with AMS. Found to have right-sided pleural effusion but source of AMS was unclear.  09/08 - s/p thoracentesis 09/14 - bilateral chest tube placement yielding 6 L fluid 09/25 - intubated 09/26 - s/p bilateral thoracenteses, chest tube inserted for pneumothorax 10/02 - s/p one-way extubation 10/03 - TF held due to episode of vomiting, Cortrak noted to be clogged and was removed 10/04 - s/p MBS, diet advanced to dysphagia 2 with thin liquids  Patient with respiratory failure, PNA, decompensated alcoholic liver cirrhosis, ascites, pleural effusion, volume overload, bilateral hydrothorax.  Cortrak removed last  night due to it being clogged. Pt passed MBS today for dysphagia 2 diet with thin liquids. Discussed pt with MD and palliative care. Recommend Cortrak be replaced to provide nocturnal tube feeds given severity of malnutrition and wounds. Palliative to discuss with family tomorrow. RD to leave tube feeding recommendations. Will order oral nutrition supplements to maximize kcal and protein intake via PO route.  Spoke with RN who reports pt ate some for lunch today. No meal completions recorded. Discussed plan for Ensure with RN.  Admit weight: 91.5 kg Current weight: 82.6 kg  Medications reviewed and include: SSI q 4 hours, lactulose 20 grams BID, heparin drip IVF: LR @ 80 ml/hr  Labs reviewed: BUN 31, magnesium 2.7, hemoglobin 8.1 CBG's: 89-110 x 24 hours  UOP: 1210 ml x 24 hours Stool: 1200 ml x 24 hours I/O's: -10.6 L since admit  Diet Order:   Diet Order             DIET DYS 2 Room service appropriate? Yes; Fluid consistency: Thin  Diet effective now                   EDUCATION NEEDS:   Not appropriate for education at this time  Skin:  Skin Assessment: Skin Integrity Issues: DTI: R & L buttocks Stage II: L buttocks Other: R hand burn; non pressure wound (fissure) to buttocks/sacrum  Last BM:  07/13/22 rectal tube  Height:   Ht Readings from Last 1 Encounters:  07/04/22 '5\' 10"'$  (1.778 m)    Weight:   Wt Readings from Last 1 Encounters:  07/12/22 82.6 kg  Ideal Body Weight:  71 kg  BMI:  Body mass index is 26.13 kg/m.  Estimated Nutritional Needs:   Kcal:  2000-2200 kcal/d  Protein:  100-120g/d  Fluid:  2-2.2L/d    Gustavus Bryant, MS, RD, LDN Inpatient Clinical Dietitian Please see AMiON for contact information.

## 2022-07-14 DIAGNOSIS — J95811 Postprocedural pneumothorax: Secondary | ICD-10-CM

## 2022-07-14 LAB — COMPREHENSIVE METABOLIC PANEL
ALT: 90 U/L — ABNORMAL HIGH (ref 0–44)
AST: 102 U/L — ABNORMAL HIGH (ref 15–41)
Albumin: 1.7 g/dL — ABNORMAL LOW (ref 3.5–5.0)
Alkaline Phosphatase: 251 U/L — ABNORMAL HIGH (ref 38–126)
Anion gap: 2 — ABNORMAL LOW (ref 5–15)
BUN: 28 mg/dL — ABNORMAL HIGH (ref 8–23)
CO2: 26 mmol/L (ref 22–32)
Calcium: 8.8 mg/dL — ABNORMAL LOW (ref 8.9–10.3)
Chloride: 114 mmol/L — ABNORMAL HIGH (ref 98–111)
Creatinine, Ser: 0.88 mg/dL (ref 0.61–1.24)
GFR, Estimated: >60 mL/min (ref >60)
Glucose, Bld: 93 mg/dL (ref 70–99)
Potassium: 4 mmol/L (ref 3.5–5.1)
Sodium: 142 mmol/L (ref 135–145)
Total Bilirubin: 0.6 mg/dL (ref 0.3–1.2)
Total Protein: 5.2 g/dL — ABNORMAL LOW (ref 6.5–8.1)

## 2022-07-14 LAB — GLUCOSE, CAPILLARY
Glucose-Capillary: 74 mg/dL (ref 70–99)
Glucose-Capillary: 85 mg/dL (ref 70–99)
Glucose-Capillary: 86 mg/dL (ref 70–99)
Glucose-Capillary: 89 mg/dL (ref 70–99)
Glucose-Capillary: 96 mg/dL (ref 70–99)

## 2022-07-14 LAB — MAGNESIUM: Magnesium: 2.6 mg/dL — ABNORMAL HIGH (ref 1.7–2.4)

## 2022-07-14 MED ORDER — GERHARDT'S BUTT CREAM
TOPICAL_CREAM | Freq: Two times a day (BID) | CUTANEOUS | Status: DC
  Filled 2022-07-14: qty 1

## 2022-07-14 NOTE — Progress Notes (Signed)
Attempted to feed during dinner but not opening his mouth. To attempt later again.

## 2022-07-14 NOTE — Progress Notes (Signed)
Brief Nutrition Note  Received secure message from Palliative Medicine team that pt's wife would not like Cortrak replaced at this time. [place] discussions are ongoing. No plan for Cortrak placement or tube feeds today. Pt will continue to receive dysphagia 2 meal trays and Ensure Enlive oral nutrition supplements between meals.  RD will continue to follow pt during admission.   Gustavus Bryant, MS, RD, LDN Inpatient Clinical Dietitian Please see AMiON for contact information.

## 2022-07-14 NOTE — Progress Notes (Addendum)
Speech Language Pathology Treatment: Dysphagia  Patient Details Name: Darrell Horne MRN: [id number] DOB: 20-Oct-1945 Today's Date: 07/14/2022 Time: 1030-1040 SLP Time Calculation (min) (ACUTE ONLY): 10 min  Assessment / Plan / Recommendation Clinical Impression  Pt demonstrates tolerance of thin liquids. Wife brought pt his dentures and SLP placed them. Pt reluctantly accepted a bite of saltine with peanut butter. Pt grimaced and struggled to transit the bolus. SLP had to remove. Recommend pt continue a dys 2 diet and thin liquids, which should be appropriate into the long term. No SLP f/u needed at this time. Will sign off.   HPI HPI: Patient is a 76 y/o male who presents on 9/7 from [facility] for AMS. Admitted with acute encephalopathy and respiratory failure. CXR-pleural effusion s/p thoracentesis 9/8. 9/14 increased O2 requirements with Bil chest tubes places. Recent admission 8/25-9/1 for acute toxic encephalopathy-drug toxicity/polypharmacy overdosing. PMH includes chronic A-fib on Eliquis, DM, HTN, peripheral neuropathy, left BKA. Previous swallow assessment, 9/22, thin liquids/dys 1 diet recommended.  Intubated 9/25, one way extubation 10/2.      SLP Plan  Continue with current plan of care      Recommendations for follow up therapy are one component of a multi-disciplinary discharge planning process, led by the attending physician.  Recommendations may be updated based on patient status, additional functional criteria and insurance authorization.    Recommendations  Diet recommendations: Dysphagia 2 (fine chop);Thin liquid Liquids provided via: Cup;Straw Medication Administration: Via alternative means Supervision: Patient able to self feed Compensations: Slow rate;Small sips/bites Postural Changes and/or Swallow Maneuvers: Seated upright 90 degrees                Oral Care Recommendations: Oral care BID Follow Up Recommendations: Skilled nursing-short term rehab (<3  hours/day) SLP Visit Diagnosis: Dysphagia, unspecified (R13.10) Plan: Continue with current plan of care           Conswella Bruney, Katherene Ponto  07/14/2022, 11:32 AM

## 2022-07-14 NOTE — Progress Notes (Signed)
PROGRESS NOTE    KAYDON HUSBY  ZOX:[id number] DOB: 02-Feb-1946 DOA: 06/16/2022 PCP: Sandrea Hughs, NP   Brief Narrative:  76 y.o. male with medical history significant for paroxysmal atrial fibrillation chronically anticoagulated on Eliquis, status post left BKA, GERD, possible alcoholic cirrhosis, essential hypertension, hyperlipidemia, recent hospitalization for encephalopathy secondary to unintentional overdose and subsequently discharged to SNF presented to the hospital with altered mental status and hypoxia from skilled nursing facility.  He was found to have moderate to large right-sided pleural effusion; CT of the head was negative.  He has had a long hospitalization.  Due to worsening respiratory status, PCCM had to be consulted.  He needed transfer to ICU.  He was treated with diuresis along with thoracentesis and bilateral chest tube placement and subsequent removal of chest tube.  On 07/01/2022, he developed progressive confusion; started on broad-spectrum antibiotics and he was subsequently intubated on 07/04/2022 and transferred to ICU care.  He had bilateral thoracentesis on 07/05/2022.  He also had A-fib with RVR requiring cardiology consultation and amiodarone.  He was subsequently extubated on 07/11/2022.  CODE STATUS has been changed to DNR.  Palliative care has also been involved in conversation with family.  Significant Hospital events: 9/8 thoracentesis with return of 1.2 L of transudative fluid 06/18/2022: Stage II partial-thickness left medial buttock pressure injury noted low 9/12 increasing oxygen requirements with up to 6 L high flow nasal cannula and episodes of hypotension with systolic of 89.  Albumin, midodrine, and tube feeds started.  Core track placed. 9/13 PCCM consulted, Eliquis held for planned thoracentesis tomorrow. CXR with stable pulm edema and pleural effusions 9/14 increased O2 requirements, stable on BiPAP now. Bilateral chest tubes placed with return of 6 L of  yellow fluid. Off BiPAP 9/15 doing well off BiPAP overnight, on 5L [place], slowed drainage from bilat chest tubes, drastically improved peripheral edema.  9/16 off bipap. Remains off pressors. Stable to transfer out of ICU  9/19 - Lactulose held due to diarrhea . More responsive today. Bilateral chest tubes clamped since 9/18.  Transudative pleural effusion deemed due to cirrhosis and cardiac impairment.  Removal of chest tubes.  Oxygen level 96% on room air.  With diuresis he is -18 L.  Delirium thought to be slowly improving ammonia level 20.  Lactulose is being continued.  For A-fib oral amiodarone and oral metoprolol continued with Eliquis.  Being maintained on midodrine as well.   Critical care medicine signed off 06/29/2022: Continue diuresis.  Improved alertness noticed. 07/01/2022: Worsening confusion but also fluctuating confusion.  Continue diuresis. 07/02/2022: Lethargic worsening confusion.  Afebrile per hospitalist 07/03/2022: PCCM Consulted for confusion and respiratory distress, transferred to ICU 9/25: Intubated  9/26: Bilateral thoracentesis w/ 1200 cc's serosnanguinous fluid drained from right pleural space and 1500 ccs of yellowish fluid from left pleural space. Had large left-sided pneumothorax afterward with pigtail chest tube placed 9/30 weaning precedex, back on low dose NE, bloody ETT secretions> heparin held 10/2: Extubated and Left Pigtail chest tube removed. 10/4: Transferred back to [place]:   Acute respiratory failure with hypoxia, multifactorial superimposed pneumonia with ESBL Klebsiella Acute on chronic bilateral pleural effusions Iatrogenic left pneumothorax, resolved -Patient has had a long hospitalization as described above requiring repeated thoracentesis; chest tubes placement and subsequent removal -Extubated on 07/11/2022 with plans for no intubation if condition worsens and to proceed with comfort measures if condition worsens.  Left-sided  chest tube was removed on 07/12/2022. -Currently on room  air  Acute metabolic encephalopathy -Possibly also has a component of hepatic encephalopathy -Monitor mental status.  Continue lactulose and rifaximin. -Fall precaution.  Delirium precautions.  Decompensated alcoholic cirrhosis of liver with ascites, pleural effusion, volume overload, portal hypertension Coagulopathy Bilateral hydrothorax Elevated LFTs -Continue lactulose.  Resume rifaximin and spironolactone.  Lasix on hold for now because of blood pressure being on the lower side.  Hypotension -Continue midodrine.  Blood pressure still on the lower side intermittently.  Paroxysmal A-fib with RVR Prolonged Qtc Nonsustained V. Tach -Continue Eliquis and oral amiodarone.  Oral metoprolol has been resumed. -Patient had episodes of VT again overnight.  Will wait for palliative care to talk to family today before deciding on cardiology evaluation.  Severe protein calorie malnutrition -Follow nutrition recommendations  Anemia of chronic disease -Hemoglobin stable.  Transfuse if hemoglobin less than 7  Hypernatremia -Improved.  Monitor.  Stage II left medial buttock pressure injury: Present on admission -Continue wound care  Generalized deconditioning Goals of care -Palliative care following.  Currently DNR.  If condition worsens, patient may benefit from comfort measures.   DVT prophylaxis: Heparin Code Status: DNR Family Communication: Wife at bedside on 07/13/2022 Disposition Plan: Status is: Inpatient Remains inpatient appropriate because: Of severity of illness  Consultants: PCCM/cardiology/palliative care  Procedures: As above  Antimicrobials:  Anti-infectives (From admission, onward)    Start     Dose/Rate Route Frequency Ordered Stop   07/13/22 2200  rifaximin (XIFAXAN) tablet 550 mg        550 mg Oral 2 times daily 07/13/22 1518     07/07/22 1300  vancomycin (VANCOREADY) IVPB 1500 mg/300 mL  Status:   Discontinued        1,500 mg 150 mL/hr over 120 Minutes Intravenous Every 24 hours 07/06/22 1354 07/07/22 1022   07/06/22 1430  meropenem (MERREM) 2 g in sodium chloride 0.9 % 100 mL IVPB        2 g 280 mL/hr over 30 Minutes Intravenous Every 8 hours 07/06/22 1339 07/13/22 1359   07/06/22 1300  vancomycin (VANCOREADY) IVPB 1500 mg/300 mL        1,500 mg 150 mL/hr over 120 Minutes Intravenous  Once 07/06/22 1202 07/06/22 1506   07/05/22 1115  vancomycin (VANCOREADY) IVPB 1500 mg/300 mL  Status:  Discontinued        1,500 mg 150 mL/hr over 120 Minutes Intravenous Every 24 hours 07/04/22 1016 07/05/22 0824   07/04/22 1100  vancomycin (VANCOREADY) IVPB 1500 mg/300 mL        1,500 mg 150 mL/hr over 120 Minutes Intravenous  Once 07/04/22 1014 07/04/22 1633   07/04/22 1000  rifaximin (XIFAXAN) tablet 550 mg  Status:  Discontinued        550 mg Per Tube 2 times daily 07/04/22 0839 07/12/22 2255   07/04/22 0900  piperacillin-tazobactam (ZOSYN) IVPB 3.375 g  Status:  Discontinued        3.375 g 12.5 mL/hr over 240 Minutes Intravenous Every 8 hours 07/04/22 0802 07/06/22 1339   07/03/22 1300  vancomycin (VANCOCIN) IVPB 1000 mg/200 mL premix  Status:  Discontinued        1,000 mg 200 mL/hr over 60 Minutes Intravenous Every 12 hours 07/03/22 0004 07/03/22 0739   07/03/22 0900  ceFEPIme (MAXIPIME) 2 g in sodium chloride 0.9 % 100 mL IVPB  Status:  Discontinued        2 g 200 mL/hr over 30 Minutes Intravenous Every 8 hours 07/03/22 0004 07/04/22 0802   07/03/22  0100  vancomycin (VANCOREADY) IVPB 1500 mg/300 mL        1,500 mg 150 mL/hr over 120 Minutes Intravenous  Once 07/03/22 0004 07/03/22 0415   07/03/22 0100  ceFEPIme (MAXIPIME) 2 g in sodium chloride 0.9 % 100 mL IVPB        2 g 200 mL/hr over 30 Minutes Intravenous  Once 07/03/22 0004 07/03/22 0204   06/25/22 1200  cefTRIAXone (ROCEPHIN) 1 g in sodium chloride 0.9 % 100 mL IVPB  Status:  Discontinued        1 g 200 mL/hr over 30 Minutes  Intravenous Every 24 hours 06/25/22 1131 06/29/22 1010   06/23/22 1400  ceFEPIme (MAXIPIME) 2 g in sodium chloride 0.9 % 100 mL IVPB  Status:  Discontinued        2 g 200 mL/hr over 30 Minutes Intravenous Every 8 hours 06/23/22 1045 06/25/22 1131   06/23/22 1000  ceFEPIme (MAXIPIME) 1 g in sodium chloride 0.9 % 100 mL IVPB  Status:  Discontinued        1 g 200 mL/hr over 30 Minutes Intravenous Every 12 hours 06/23/22 0529 06/23/22 1045   06/23/22 1000  linezolid (ZYVOX) tablet 600 mg  Status:  Discontinued        600 mg Per Tube Every 12 hours 06/23/22 0529 06/24/22 1100   06/22/22 1145  cefTRIAXone (ROCEPHIN) 1 g in sodium chloride 0.9 % 100 mL IVPB  Status:  Discontinued        1 g 200 mL/hr over 30 Minutes Intravenous Every 24 hours 06/22/22 1055 06/23/22 0529   06/22/22 1145  azithromycin (ZITHROMAX) 500 mg in sodium chloride 0.9 % 250 mL IVPB  Status:  Discontinued        500 mg 250 mL/hr over 60 Minutes Intravenous Every 24 hours 06/22/22 1055 06/23/22 0529        Subjective: Patient seen and examined at bedside.  Still very slow to respond and a poor historian.  Nursing staff reports episodes of V. tach overnight.  No fever, seizures, vomiting reported. Objective: Vitals:   07/14/22 0236 07/14/22 0300 07/14/22 0350 07/14/22 0500  BP: (!) 91/49 (!) 89/50 (!) 84/49 (!) 93/51  Pulse: (!) 101  (!) 112 90  Resp: _0 Temp:   98.7 F (37.1 C)   TempSrc:   Oral   SpO2: 95% 98% 97% 96%  Weight:      Height:        Intake/Output Summary (Last 24 hours) at 07/14/2022 0720 Last data filed at 07/14/2022 0600 Gross per 24 hour  Intake 2394.19 ml  Output 625 ml  Net 1769.19 ml    Filed Weights   07/10/22 0214 07/11/22 0434 07/12/22 0401  Weight: 78.8 kg 82 kg 82.6 kg    Examination:  General: On room air.  No distress.  Looks chronically ill and deconditioned.  Poor historian. ENT/neck: No thyromegaly.  JVD is not elevated  respiratory: Decreased breath sounds at  bases bilaterally with some crackles; no wheezing  CVS: S1-S2 heard, tachycardic Abdominal: Soft, nontender, slightly distended; no organomegaly, normal bowel sounds are heard Extremities: Trace lower extremity edema; no cyanosis  CNS: Awake, extremely slow to respond.  No focal neurologic deficit.  Moves extremities Lymph: No obvious lymphadenopathy Skin: No obvious ecchymosis/lesions  psych: Flat affect.  Currently not agitated.   Musculoskeletal: No obvious joint swelling/deformity    Data Reviewed: I have personally reviewed following labs and imaging studies  CBC: Recent Labs  Lab 07/09/22 0316 07/10/22 0217 07/11/22 0253 07/12/22 0600 07/13/22 0555  WBC 10.4 7.7 9.7 9.2 9.1  HGB 8.7* 7.8* 7.7* 8.4* 8.1*  HCT 27.0* 25.0* 24.6* 26.3* 26.2*  MCV 104.2* 105.9* 106.0* 104.0* 106.9*  PLT 264 219 239 217 536    Basic Metabolic Panel: Recent Labs  Lab 07/09/22 1337 07/09/22 2049 07/10/22 0217 07/11/22 0253 07/12/22 0600 07/13/22 0555  NA 151* 152* 148* 148*  --  145  K 4.4 4.2 4.0 4.3  --  4.4  CL 122* 125* 122* 119*  --  112*  CO2 _0 --  26  GLUCOSE 173* 186* 173* 132*  --  95  BUN 58* 50* 48* 40*  --  31*  CREATININE 0.93 0.90 0.88 0.97  --  0.87  CALCIUM 8.7* 8.6* 8.5* 8.4*  --  9.2  MG  --  2.7* 2.8* 2.8* 2.7* 2.7*  PHOS  --   --  2.6 2.8 2.7  --     GFR: Estimated Creatinine Clearance: 74.6 mL/min (by C-G formula based on SCr of 0.87 mg/dL). Liver Function Tests: Recent Labs  Lab 07/08/22 0454  AST 165*  ALT 171*  ALKPHOS 200*  BILITOT 0.9  PROT 5.2*  ALBUMIN 1.6*    No results for input(s): "LIPASE", "AMYLASE" in the last 168 hours. Recent Labs  Lab 07/09/22 1337 07/13/22 0555  AMMONIA 24 26    Coagulation Profile: Recent Labs  Lab 07/09/22 1844  INR 1.2    Cardiac Enzymes: No results for input(s): "CKTOTAL", "CKMB", "CKMBINDEX", "TROPONINI" in the last 168 hours. BNP (last 3 results) No results for input(s): "PROBNP" in  the last 8760 hours. HbA1C: No results for input(s): "HGBA1C" in the last 72 hours. CBG: Recent Labs  Lab 07/13/22 1150 07/13/22 1630 07/13/22 2109 07/13/22 2341 07/14/22 0350  GLUCAP 110* 86 85 117* 86    Lipid Profile: No results for input(s): "CHOL", "HDL", "LDLCALC", "TRIG", "CHOLHDL", "LDLDIRECT" in the last 72 hours. Thyroid Function Tests: No results for input(s): "TSH", "T4TOTAL", "FREET4", "T3FREE", "THYROIDAB" in the last 72 hours. Anemia Panel: No results for input(s): "VITAMINB12", "FOLATE", "FERRITIN", "TIBC", "IRON", "RETICCTPCT" in the last 72 hours. Sepsis Labs: No results for input(s): "PROCALCITON", "LATICACIDVEN" in the last 168 hours.  Recent Results (from the past 240 hour(s))  Culture, Respiratory w Gram Stain     Status: None   Collection Time: 07/04/22 12:38 PM   Specimen: Bronchoalveolar Lavage; Respiratory  Result Value Ref Range Status   Specimen Description BRONCHIAL ALVEOLAR LAVAGE  Final   Special Requests NONE  Final   Gram Stain NO WBC SEEN NO ORGANISMS SEEN   Final   Culture   Final    FEW KLEBSIELLA PNEUMONIAE FEW ENTEROCOCCUS FAECALIS FEW STAPHYLOCOCCUS AUREUS Confirmed Extended Spectrum Beta-Lactamase Producer (ESBL).  In bloodstream infections from ESBL organisms, carbapenems are preferred over piperacillin/tazobactam. They are shown to have a lower risk of mortality. FOR KLEBSIELLA PNEUMONIAE Performed at [facility], [place] [address] [place], [place] [address]    Report Status 07/07/2022 FINAL  Final   Organism ID, Bacteria KLEBSIELLA PNEUMONIAE  Final   Organism ID, Bacteria ENTEROCOCCUS FAECALIS  Final   Organism ID, Bacteria STAPHYLOCOCCUS AUREUS  Final      Susceptibility   Enterococcus faecalis - MIC*    AMPICILLIN <=2 SENSITIVE Sensitive     VANCOMYCIN 1 SENSITIVE Sensitive     GENTAMICIN SYNERGY SENSITIVE Sensitive     * FEW ENTEROCOCCUS FAECALIS  Klebsiella pneumoniae - MIC*    AMPICILLIN >=32 RESISTANT  Resistant     CEFAZOLIN >=64 RESISTANT Resistant     CEFEPIME 8 INTERMEDIATE Intermediate     CEFTAZIDIME RESISTANT Resistant     CEFTRIAXONE >=64 RESISTANT Resistant     CIPROFLOXACIN 0.5 INTERMEDIATE Intermediate     GENTAMICIN >=16 RESISTANT Resistant     IMIPENEM <=0.25 SENSITIVE Sensitive     TRIMETH/SULFA >=320 RESISTANT Resistant     AMPICILLIN/SULBACTAM >=32 RESISTANT Resistant     PIP/TAZO <=4 SENSITIVE Sensitive     * FEW KLEBSIELLA PNEUMONIAE   Staphylococcus aureus - MIC*    CIPROFLOXACIN <=0.5 SENSITIVE Sensitive     ERYTHROMYCIN RESISTANT Resistant     GENTAMICIN <=0.5 SENSITIVE Sensitive     OXACILLIN 0.5 SENSITIVE Sensitive     TETRACYCLINE <=1 SENSITIVE Sensitive     VANCOMYCIN <=0.5 SENSITIVE Sensitive     TRIMETH/SULFA <=10 SENSITIVE Sensitive     CLINDAMYCIN RESISTANT Resistant     RIFAMPIN <=0.5 SENSITIVE Sensitive     Inducible Clindamycin POSITIVE Resistant     * FEW STAPHYLOCOCCUS AUREUS         Radiology Studies: No results found.      Scheduled Meds:  amiodarone  200 mg Oral BID   apixaban  5 mg Per Tube BID   Chlorhexidine Gluconate Cloth  6 each Topical Daily   feeding supplement  237 mL Oral TID BM   folic acid  1 mg Oral Daily   insulin aspart  0-9 Units Subcutaneous Q4H   lactulose  20 g Oral BID   metoprolol tartrate  12.5 mg Oral Q8H   midodrine  10 mg Oral TID WC   mupirocin cream   Topical Daily   mouth rinse  15 mL Mouth Rinse Q2H   rifaximin  550 mg Oral BID   spironolactone  100 mg Oral Daily   thiamine  100 mg Oral Daily   Continuous Infusions:  sodium chloride Stopped (07/08/22 2112)   lactated ringers 80 mL/hr at 07/14/22 0248          Aline August, MD [facility] 07/14/2022, 7:20 AM

## 2022-07-14 NOTE — Progress Notes (Signed)
Palliative Care Progress Note, Assessment & Plan   Patient Name: Darrell Horne       Date: 07/14/2022 DOB: Jan 22, 1946  Age: 76 y.o. MRN#: [id number] Attending Physician: Aline August, MD Primary Care Physician: Sandrea Hughs, NP Admit Date: 06/16/2022  Reason for Consultation/Follow-up: Establishing goals of care  Subjective: Pt resting comfortably in the bed. NAD noted. Pt denies any pain, shortness of breath, or discomfort at this time. He is quite slow to respond to my questions. Responses bizarre.   HPI: 76 year old male with a past medical history of paroxysmal A-fib on Eliquis, left BKA, HTN, HLD, and alcoholic cirrhosis who presented to the hospital with altered mental status and was admitted on 06/16/2022. He was found to have a large right-sided pleural effusion and his intermittently increased since August 26.  Thoracentesis on 06/17/2022 with 2 L of yellow fluid was found to be transudative pleural effusion. Post procedure developed increased oxygen requirements and hypotension. 06/22/2022 PCCM consulted for evaluation and management of his right-sided pleural effusion and acute hypoxic respiratory failure.   Summary of counseling/coordination of care: After reviewing the patient's chart, receiving update from Dr. Starla Link, and assessing the patient at bedside, met with patient's wife [name].   We discussed patient's current illness and what it means in the larger context of patient's on-going co-morbidities.  Natural disease trajectory and expectations at EOL were discussed. Discussed long hospital course and multiple complications.  We discussed patient's ongoing encephalopathy.  We discussed patient's poor p.o. intake and potential need for alternate source of nutrition.  We discussed his cirrhotic  liver.  We discussed patient's runs of V. tach throughout the night.  I attempted to elicit values and goals of care important to the patient.  Patient's wife shares that patient has been through a lot and she is not sure he would want to continue.  We discussed best case scenario patient survives this hospitalization and discharges to rehab but highly unlikely he would be able to return to independent living.  Wife tells me she has excepted that patient will not return to his home.  The difference between aggressive medical intervention and comfort care was considered in light of the patient's goals of care.  Wife tells me she is leaning towards transitioning to full comfort measures.  She tells me she would like to speak with patient's niece and sister who are involved in his care prior to making definite decision.  She tells me she plans to have this conversation with him tonight and will give [place] a definite answer tomorrow.  She tells me she would like [place] to hold off on feeding tube placement for now.  Discussed with wife the importance of continued conversation with family and the medical providers regarding overall plan of care and treatment options, ensuring decisions are within the context of the patient's values and GOCs.    Hospice services outpatient were explained and offered.  We discussed with transition to comfort measures hospice would likely be appropriate.  Discussed philosophy of hospice care discussed option of hospice facility.  Questions and concerns were addressed. The family was encouraged to call with questions or concerns.   Code Status: DNR   Physical  Exam Vitals and nursing note reviewed.  Constitutional:      General: He is not in acute distress.    Appearance: He is ill-appearing.     Comments: Slow to respond Disoriented Frail  Pulmonary:     Effort: Pulmonary effort is normal.  Skin:    General: Skin is warm and dry.  Neurological:     Mental Status: He is  alert.     GCS: GCS eye subscore is 4. GCS verbal subscore is 4. GCS motor subscore is 6.             Palliative Assessment/Data: 20%     Thank you for allowing the Palliative Medicine Team to assist in the care of this patient.  Juel Burrow, DNP, AGNP-C Palliative Medicine Team Team Phone # [phone]  Pager # [phone]

## 2022-07-14 NOTE — Progress Notes (Signed)
Patient with urge to urinate but unable to despite having foley.  Catheter appears to still be in place, but bed pad is saturated with urine.  Attempts at repositioning bag and deflating/ reinflating balloon with no urine flow. Foley removed because patient was having difficulty.  Patient able to void unmeasureable amount once catheter removed but still has greater than 350 remaining. MD orders to replace foley.

## 2022-07-15 ENCOUNTER — Ambulatory Visit: Payer: Medicare Other | Admitting: Nurse Practitioner

## 2022-07-15 LAB — CBC WITH DIFFERENTIAL/PLATELET
Abs Immature Granulocytes: 0.07 10*3/uL (ref 0.00–0.07)
Basophils Absolute: 0 10*3/uL (ref 0.0–0.1)
Basophils Relative: 0 %
Eosinophils Absolute: 0.2 10*3/uL (ref 0.0–0.5)
Eosinophils Relative: 4 %
HCT: 26.6 % — ABNORMAL LOW (ref 39.0–52.0)
Hemoglobin: 8.4 g/dL — ABNORMAL LOW (ref 13.0–17.0)
Immature Granulocytes: 1 %
Lymphocytes Relative: 24 %
Lymphs Abs: 1.5 10*3/uL (ref 0.7–4.0)
MCH: 33.5 pg (ref 26.0–34.0)
MCHC: 31.6 g/dL (ref 30.0–36.0)
MCV: 106 fL — ABNORMAL HIGH (ref 80.0–100.0)
Monocytes Absolute: 0.6 10*3/uL (ref 0.1–1.0)
Monocytes Relative: 10 %
Neutro Abs: 3.8 10*3/uL (ref 1.7–7.7)
Neutrophils Relative %: 61 %
Platelets: 150 10*3/uL (ref 150–400)
RBC: 2.51 MIL/uL — ABNORMAL LOW (ref 4.22–5.81)
RDW: 17.2 % — ABNORMAL HIGH (ref 11.5–15.5)
WBC: 6.2 10*3/uL (ref 4.0–10.5)
nRBC: 0 % (ref 0.0–0.2)

## 2022-07-15 LAB — GLUCOSE, CAPILLARY
Glucose-Capillary: 73 mg/dL (ref 70–99)
Glucose-Capillary: 86 mg/dL (ref 70–99)
Glucose-Capillary: 74 mg/dL (ref 70–99)
Glucose-Capillary: 65 mg/dL — ABNORMAL LOW (ref 70–99)
Glucose-Capillary: 144 mg/dL — ABNORMAL HIGH (ref 70–99)

## 2022-07-15 LAB — COMPREHENSIVE METABOLIC PANEL
ALT: 83 U/L — ABNORMAL HIGH (ref 0–44)
AST: 86 U/L — ABNORMAL HIGH (ref 15–41)
Albumin: 1.7 g/dL — ABNORMAL LOW (ref 3.5–5.0)
Alkaline Phosphatase: 280 U/L — ABNORMAL HIGH (ref 38–126)
Anion gap: 4 — ABNORMAL LOW (ref 5–15)
BUN: 22 mg/dL (ref 8–23)
CO2: 27 mmol/L (ref 22–32)
Calcium: 9.2 mg/dL (ref 8.9–10.3)
Chloride: 112 mmol/L — ABNORMAL HIGH (ref 98–111)
Creatinine, Ser: 0.75 mg/dL (ref 0.61–1.24)
GFR, Estimated: >60 mL/min (ref >60)
Glucose, Bld: 119 mg/dL — ABNORMAL HIGH (ref 70–99)
Potassium: 3.7 mmol/L (ref 3.5–5.1)
Sodium: 143 mmol/L (ref 135–145)
Total Bilirubin: 1 mg/dL (ref 0.3–1.2)
Total Protein: 5.1 g/dL — ABNORMAL LOW (ref 6.5–8.1)

## 2022-07-15 LAB — MAGNESIUM: Magnesium: 2.3 mg/dL (ref 1.7–2.4)

## 2022-07-15 MED ORDER — ACETAMINOPHEN 325 MG PO TABS: 650.0000 mg | ORAL_TABLET | Freq: Four times a day (QID) | ORAL | Status: DC | PRN

## 2022-07-15 MED ORDER — DEXTROSE 50 % IV SOLN
INTRAVENOUS | Status: AC
  Filled 2022-07-15: qty 50

## 2022-07-15 MED ORDER — LORAZEPAM 2 MG/ML PO CONC: 1.0000 mg | ORAL | Status: DC | PRN

## 2022-07-15 MED ORDER — HALOPERIDOL LACTATE 5 MG/ML IJ SOLN: 0.5000 mg | INTRAMUSCULAR | Status: DC | PRN

## 2022-07-15 MED ORDER — MORPHINE SULFATE (PF) 2 MG/ML IV SOLN: 1.0000 mg | INTRAVENOUS | Status: DC | PRN

## 2022-07-15 MED ORDER — LORAZEPAM 2 MG/ML IJ SOLN: 1.0000 mg | INTRAMUSCULAR | Status: DC | PRN

## 2022-07-15 MED ORDER — GLYCOPYRROLATE 0.2 MG/ML IJ SOLN: 0.2000 mg | INTRAMUSCULAR | Status: DC | PRN

## 2022-07-15 MED ORDER — DEXTROSE 50 % IV SOLN
12.5000 g | INTRAVENOUS | Status: AC
  Administered 2022-07-15: 12.5 g via INTRAVENOUS

## 2022-07-15 MED ORDER — POLYVINYL ALCOHOL 1.4 % OP SOLN
1.0000 [drp] | Freq: Four times a day (QID) | OPHTHALMIC | Status: DC | PRN
  Administered 2022-07-15 – 2022-07-16 (×2): 1 [drp] via OPHTHALMIC
  Filled 2022-07-15 (×2): qty 15

## 2022-07-15 MED ORDER — HALOPERIDOL LACTATE 2 MG/ML PO CONC: 0.5000 mg | ORAL | Status: DC | PRN

## 2022-07-15 MED ORDER — ORAL CARE MOUTH RINSE: 15.0000 mL | OROMUCOSAL | Status: DC | PRN

## 2022-07-15 MED ORDER — ORAL CARE MOUTH RINSE
15.0000 mL | OROMUCOSAL | Status: DC
  Administered 2022-07-15 (×3): 15 mL via OROMUCOSAL

## 2022-07-15 MED ORDER — ACETAMINOPHEN 650 MG RE SUPP: 650.0000 mg | Freq: Four times a day (QID) | RECTAL | Status: DC | PRN

## 2022-07-15 MED ORDER — GLYCOPYRROLATE 1 MG PO TABS: 1.0000 mg | ORAL_TABLET | ORAL | Status: DC | PRN

## 2022-07-15 MED ORDER — BIOTENE DRY MOUTH MT LIQD: 15.0000 mL | OROMUCOSAL | Status: DC | PRN

## 2022-07-15 MED ORDER — MORPHINE SULFATE (CONCENTRATE) 10 MG/0.5ML PO SOLN: 5.0000 mg | ORAL | Status: DC | PRN

## 2022-07-15 MED ORDER — HALOPERIDOL 0.5 MG PO TABS: 0.5000 mg | ORAL_TABLET | ORAL | Status: DC | PRN

## 2022-07-15 MED ORDER — LORAZEPAM 1 MG PO TABS: 1.0000 mg | ORAL_TABLET | ORAL | Status: DC | PRN

## 2022-07-15 NOTE — Plan of Care (Signed)
  Problem: Clinical Measurements: Goal: Ability to maintain clinical measurements within normal limits will improve Outcome: Progressing Goal: Respiratory complications will improve Outcome: Progressing Goal: Cardiovascular complication will be avoided Outcome: Progressing   Problem: Nutrition: Goal: Adequate nutrition will be maintained Outcome: Not Progressing   Problem: Coping: Goal: Level of anxiety will decrease Outcome: Progressing

## 2022-07-15 NOTE — Plan of Care (Signed)
  Problem: Clinical Measurements: Goal: Respiratory complications will improve Outcome: Progressing   Problem: Coping: Goal: Level of anxiety will decrease Outcome: Progressing   Problem: Elimination: Goal: Will not experience complications related to bowel motility Outcome: Progressing Goal: Will not experience complications related to urinary retention Outcome: Progressing   Problem: Clinical Measurements: Goal: Cardiovascular complication will be avoided Outcome: Not Progressing   Problem: Activity: Goal: Risk for activity intolerance will decrease Outcome: Not Progressing   Problem: Nutrition: Goal: Adequate nutrition will be maintained Outcome: Not Progressing   Problem: Skin Integrity: Goal: Risk for impaired skin integrity will decrease Outcome: Not Progressing

## 2022-07-15 NOTE — Progress Notes (Addendum)
Palliative Medicine Progress Note   Patient Name: Darrell Horne       Date: 07/15/2022 DOB: November 02, 1945  Age: 76 y.o. MRN#: [id number] Attending Physician: Aline August, MD Primary Care Physician: Sandrea Hughs, NP Admit Date: 06/16/2022  Reason for Consultation/Follow-up: Establishing goals of care  HPI/Patient Profile: 76 year old male with a past medical history of paroxysmal A-fib on Eliquis, left BKA, HTN, HLD, and alcoholic cirrhosis who presented to the hospital with altered mental status and was admitted on 06/16/2022. He was found to have a large right-sided pleural effusion and his intermittently increased since August 26.  Thoracentesis on 06/17/2022 with 2 L of yellow fluid was found to be transudative pleural effusion. Post procedure developed increased oxygen requirements and hypotension. 06/22/2022 PCCM consulted for evaluation and management of his right-sided pleural effusion and acute hypoxic respiratory failure.   Subjective: Chart reviewed and update received from patient's RN.  Unfortunately, patient's PO intake remains very minimal with maximal encouragement by staff and family.  He is also refusing to take medications including lactulose, midodrine, and metoprolol.    I met with patient's wife Darrell Horne at bedside.  We reviewed patient's long hospital course and multiple complications.  We discussed patient's poor p.o. intake and malnutrition.  Darrell Horne is very clear that she does not wish to have the feeding tube replaced.  She feels that Jacquise has been through a lot already and that his body is tired.  After speaking with patient's sister and niece last night, she has decided to transition to comfort care.  We reviewed the specifics of comfort care and what that would look like--keeping him  clean and dry, no labs, no artificial hydration or feeding, no antibiotics, minimizing of medications, comfort feeds, and abiding medication for pain and other symptoms.  We also discussed that cardiac monitoring and CBG checks would be discontinued.  Reviewed natural trajectory at end-of-life.  Also discussed the option to transfer to a residential hospice facility for a more peaceful setting at end-of-life.  Darrell Horne will consider this and let the team know tomorrow.  Emotional support provided.  Plan of care was discussed with Dr. Starla Link and patient's RN.   Objective:  Physical Exam Vitals reviewed.  Constitutional:      General: He is not in acute distress.    Appearance: He is ill-appearing.  Pulmonary:     Effort: Pulmonary effort is normal.  Musculoskeletal:     Left Lower Extremity: Left leg is amputated below knee.  Neurological:     Mental Status: He is alert. He is confused.     Motor: Weakness present.     Comments: Minimally verbal             Vital Signs: BP (!) 98/55 (BP Location: Left Arm)   Pulse 100   Temp 98.5 F (36.9 C) (Oral)   Resp 16   Ht _0  (1.778 m)   Wt 85.4 kg   SpO2 98%   BMI 27.01 kg/m  SpO2: SpO2: 98 % O2 Device: O2 Device: Room Air O2 Flow Rate: O2 Flow Rate (L/min): 3 L/min    LBM: Last BM Date : 07/15/22     Palliative Assessment/Data: PPS 10-20%     Palliative Medicine Assessment & Plan   Assessment: Principal Problem:   Encephalopathy Active Problems:   PAF (paroxysmal atrial fibrillation) (HCC)   DM2 (diabetes mellitus, type 2) (HCC)   HTN (hypertension)   HLD (hyperlipidemia)   Hx of BKA, left (HCC)   Overdose by ingestion   Pleural effusion   Pressure injury of skin   Anemia   Protein-calorie malnutrition, severe   Acute respiratory failure with hypoxia (HCC)   Bilateral pleural effusion   Postprocedural pneumothorax    Recommendations/Plan: Full comfort measures initiated DNR/DNI as previously  documented Discontinue labs, cardiac monitoring, and CBG checks Added orders for symptom management at EOL  PMT will continue to follow   Symptom Management:  Morphine prn for pain or dyspnea Lorazepam (ATIVAN) prn for anxiety Haloperidol (HALDOL) prn for agitation  Glycopyrrolate (ROBINUL) for excessive secretions Ondansetron (ZOFRAN) prn for nausea Polyvinyl alcohol (LIQUIFILM TEARS) prn for dry eyes Antiseptic oral rinse (BIOTENE) prn for dry mouth   Prognosis:  < 2 weeks  Discharge Planning: To Be Determined   Thank you for allowing the Palliative Medicine Team to assist in the care of this patient.   MDM - High   Lavena Bullion, NP   Please contact Palliative Medicine Team phone at [phone] for questions and concerns.  For individual providers, please see AMION.

## 2022-07-15 NOTE — Progress Notes (Signed)
PROGRESS NOTE    Darrell Horne  ZOX:[id number] DOB: 02-Feb-1946 DOA: 06/16/2022 PCP: Sandrea Hughs, NP   Brief Narrative:  76 y.o. male with medical history significant for paroxysmal atrial fibrillation chronically anticoagulated on Eliquis, status post left BKA, GERD, possible alcoholic cirrhosis, essential hypertension, hyperlipidemia, recent hospitalization for encephalopathy secondary to unintentional overdose and subsequently discharged to SNF presented to the hospital with altered mental status and hypoxia from skilled nursing facility.  He was found to have moderate to large right-sided pleural effusion; CT of the head was negative.  He has had a long hospitalization.  Due to worsening respiratory status, PCCM had to be consulted.  He needed transfer to ICU.  He was treated with diuresis along with thoracentesis and bilateral chest tube placement and subsequent removal of chest tube.  On 07/01/2022, he developed progressive confusion; started on broad-spectrum antibiotics and he was subsequently intubated on 07/04/2022 and transferred to ICU care.  He had bilateral thoracentesis on 07/05/2022.  He also had A-fib with RVR requiring cardiology consultation and amiodarone.  He was subsequently extubated on 07/11/2022.  CODE STATUS has been changed to DNR.  Palliative care has also been involved in conversation with family.  Significant Hospital events: 9/8 thoracentesis with return of 1.2 L of transudative fluid 06/18/2022: Stage II partial-thickness left medial buttock pressure injury noted low 9/12 increasing oxygen requirements with up to 6 L high flow nasal cannula and episodes of hypotension with systolic of 89.  Albumin, midodrine, and tube feeds started.  Core track placed. 9/13 PCCM consulted, Eliquis held for planned thoracentesis tomorrow. CXR with stable pulm edema and pleural effusions 9/14 increased O2 requirements, stable on BiPAP now. Bilateral chest tubes placed with return of 6 L of  yellow fluid. Off BiPAP 9/15 doing well off BiPAP overnight, on 5L [place], slowed drainage from bilat chest tubes, drastically improved peripheral edema.  9/16 off bipap. Remains off pressors. Stable to transfer out of ICU  9/19 - Lactulose held due to diarrhea . More responsive today. Bilateral chest tubes clamped since 9/18.  Transudative pleural effusion deemed due to cirrhosis and cardiac impairment.  Removal of chest tubes.  Oxygen level 96% on room air.  With diuresis he is -18 L.  Delirium thought to be slowly improving ammonia level 20.  Lactulose is being continued.  For A-fib oral amiodarone and oral metoprolol continued with Eliquis.  Being maintained on midodrine as well.   Critical care medicine signed off 06/29/2022: Continue diuresis.  Improved alertness noticed. 07/01/2022: Worsening confusion but also fluctuating confusion.  Continue diuresis. 07/02/2022: Lethargic worsening confusion.  Afebrile per hospitalist 07/03/2022: PCCM Consulted for confusion and respiratory distress, transferred to ICU 9/25: Intubated  9/26: Bilateral thoracentesis w/ 1200 cc's serosnanguinous fluid drained from right pleural space and 1500 ccs of yellowish fluid from left pleural space. Had large left-sided pneumothorax afterward with pigtail chest tube placed 9/30 weaning precedex, back on low dose NE, bloody ETT secretions> heparin held 10/2: Extubated and Left Pigtail chest tube removed. 10/4: Transferred back to [place]:   Acute respiratory failure with hypoxia, multifactorial superimposed pneumonia with ESBL Klebsiella Acute on chronic bilateral pleural effusions Iatrogenic left pneumothorax, resolved -Patient has had a long hospitalization as described above requiring repeated thoracentesis; chest tubes placement and subsequent removal -Extubated on 07/11/2022 with plans for no intubation if condition worsens and to proceed with comfort measures if condition worsens.  Left-sided  chest tube was removed on 07/12/2022. -Currently on room  air  Acute metabolic encephalopathy -Possibly also has a component of hepatic encephalopathy -Monitor mental status.  Continue lactulose and rifaximin. -Fall precaution.  Delirium precautions.  Decompensated alcoholic cirrhosis of liver with ascites, pleural effusion, volume overload, portal hypertension Coagulopathy Bilateral hydrothorax Elevated LFTs -Continue lactulose.  Continue rifaximin and spironolactone.  Lasix on hold for now because of blood pressure being on the lower side and hypernatremia.  Hypotension -Continue midodrine, increase dose to 15 mg 3 times daily which was being used few days prior.  Blood pressure still on the lower side intermittently.  Paroxysmal A-fib with RVR Prolonged Qtc Nonsustained V. Tach -Continue Eliquis and oral amiodarone.  Oral metoprolol has been resumed. -Family contemplating hospice/comfort measures.  We will hold off on cardiology evaluation.  Severe protein calorie malnutrition -Follow nutrition recommendations  Anemia of chronic disease -Hemoglobin stable.  Transfuse if hemoglobin less than 7  Hypernatremia -Improved.  Monitor.  Stage II left medial buttock pressure injury: Present on admission -Continue wound care  Generalized deconditioning Goals of care -Palliative care following.  Currently DNR.   -- Overall prognosis is very poor.  Family contemplating hospice/comfort measures.  We will hold off on cardiology evaluation.  DVT prophylaxis: Heparin Code Status: DNR Family Communication: Wife at bedside on 07/13/2022 Disposition Plan: Status is: Inpatient Remains inpatient appropriate because: Of severity of illness  Consultants: PCCM/cardiology/palliative care  Procedures: As above  Antimicrobials:  Anti-infectives (From admission, onward)    Start     Dose/Rate Route Frequency Ordered Stop   07/13/22 2200  rifaximin (XIFAXAN) tablet 550 mg        550 mg  Oral 2 times daily 07/13/22 1518     07/07/22 1300  vancomycin (VANCOREADY) IVPB 1500 mg/300 mL  Status:  Discontinued        1,500 mg 150 mL/hr over 120 Minutes Intravenous Every 24 hours 07/06/22 1354 07/07/22 1022   07/06/22 1430  meropenem (MERREM) 2 g in sodium chloride 0.9 % 100 mL IVPB        2 g 280 mL/hr over 30 Minutes Intravenous Every 8 hours 07/06/22 1339 07/13/22 1359   07/06/22 1300  vancomycin (VANCOREADY) IVPB 1500 mg/300 mL        1,500 mg 150 mL/hr over 120 Minutes Intravenous  Once 07/06/22 1202 07/06/22 1506   07/05/22 1115  vancomycin (VANCOREADY) IVPB 1500 mg/300 mL  Status:  Discontinued        1,500 mg 150 mL/hr over 120 Minutes Intravenous Every 24 hours 07/04/22 1016 07/05/22 0824   07/04/22 1100  vancomycin (VANCOREADY) IVPB 1500 mg/300 mL        1,500 mg 150 mL/hr over 120 Minutes Intravenous  Once 07/04/22 1014 07/04/22 1633   07/04/22 1000  rifaximin (XIFAXAN) tablet 550 mg  Status:  Discontinued        550 mg Per Tube 2 times daily 07/04/22 0839 07/12/22 2255   07/04/22 0900  piperacillin-tazobactam (ZOSYN) IVPB 3.375 g  Status:  Discontinued        3.375 g 12.5 mL/hr over 240 Minutes Intravenous Every 8 hours 07/04/22 0802 07/06/22 1339   07/03/22 1300  vancomycin (VANCOCIN) IVPB 1000 mg/200 mL premix  Status:  Discontinued        1,000 mg 200 mL/hr over 60 Minutes Intravenous Every 12 hours 07/03/22 0004 07/03/22 0739   07/03/22 0900  ceFEPIme (MAXIPIME) 2 g in sodium chloride 0.9 % 100 mL IVPB  Status:  Discontinued        2 g  200 mL/hr over 30 Minutes Intravenous Every 8 hours 07/03/22 0004 07/04/22 0802   07/03/22 0100  vancomycin (VANCOREADY) IVPB 1500 mg/300 mL        1,500 mg 150 mL/hr over 120 Minutes Intravenous  Once 07/03/22 0004 07/03/22 0415   07/03/22 0100  ceFEPIme (MAXIPIME) 2 g in sodium chloride 0.9 % 100 mL IVPB        2 g 200 mL/hr over 30 Minutes Intravenous  Once 07/03/22 0004 07/03/22 0204   06/25/22 1200  cefTRIAXone (ROCEPHIN)  1 g in sodium chloride 0.9 % 100 mL IVPB  Status:  Discontinued        1 g 200 mL/hr over 30 Minutes Intravenous Every 24 hours 06/25/22 1131 06/29/22 1010   06/23/22 1400  ceFEPIme (MAXIPIME) 2 g in sodium chloride 0.9 % 100 mL IVPB  Status:  Discontinued        2 g 200 mL/hr over 30 Minutes Intravenous Every 8 hours 06/23/22 1045 06/25/22 1131   06/23/22 1000  ceFEPIme (MAXIPIME) 1 g in sodium chloride 0.9 % 100 mL IVPB  Status:  Discontinued        1 g 200 mL/hr over 30 Minutes Intravenous Every 12 hours 06/23/22 0529 06/23/22 1045   06/23/22 1000  linezolid (ZYVOX) tablet 600 mg  Status:  Discontinued        600 mg Per Tube Every 12 hours 06/23/22 0529 06/24/22 1100   06/22/22 1145  cefTRIAXone (ROCEPHIN) 1 g in sodium chloride 0.9 % 100 mL IVPB  Status:  Discontinued        1 g 200 mL/hr over 30 Minutes Intravenous Every 24 hours 06/22/22 1055 06/23/22 0529   06/22/22 1145  azithromycin (ZITHROMAX) 500 mg in sodium chloride 0.9 % 250 mL IVPB  Status:  Discontinued        500 mg 250 mL/hr over 60 Minutes Intravenous Every 24 hours 06/22/22 1055 06/23/22 0529        Subjective: Patient seen and examined at bedside.  Poor historian; slow to respond.  No agitation, seizures, vomiting or fever reported.  Oral intake is very poor as per nursing staff.   Objective: Vitals:   07/14/22 2010 07/14/22 2349 07/15/22 0330 07/15/22 0500  BP: (!) 90/48 (!) 88/46 (!) 93/56   Pulse: 100 82 90   Resp: 20  19   Temp: 98 F (36.7 C) 98.5 F (36.9 C) 98.5 F (36.9 C)   TempSrc: Oral Oral Oral   SpO2: 98% 96% 94%   Weight:    85.4 kg  Height:        Intake/Output Summary (Last 24 hours) at 07/15/2022 0746 Last data filed at 07/15/2022 0409 Gross per 24 hour  Intake 2060.05 ml  Output 1600 ml  Net 460.05 ml    Filed Weights   07/11/22 0434 07/12/22 0401 07/15/22 0500  Weight: 82 kg 82.6 kg 85.4 kg    Examination:  General: No distress currently.  Still on room air.  Looks  chronically ill and deconditioned.  Poor historian. ENT/neck: No palpable neck masses or obvious JVD elevation noted respiratory: Bilateral decreased breath sounds at bases with scattered crackles  CVS: Rate controlled currently; S1 and S2 are heard  abdominal: Soft, nontender, distended mildly; no organomegaly, bowel sounds are heard Extremities: No clubbing; mild right lower extremity edema present.  Left BKA present. CNS: Sleepy, wakes up only very slightly, still very slow to respond.  No focal neurologic deficit.  Moving extremities. Lymph: No palpable  lymphadenopathy noted Skin: No obvious rashes/petechiae psych: No signs of agitation currently.  Affect is extremely flat. Musculoskeletal: No obvious joint swelling/deformity    Data Reviewed: I have personally reviewed following labs and imaging studies  CBC: Recent Labs  Lab 07/10/22 0217 07/11/22 0253 07/12/22 0600 07/13/22 0555 07/15/22 0447  WBC 7.7 9.7 9.2 9.1 6.2  NEUTROABS  --   --   --   --  3.8  HGB 7.8* 7.7* 8.4* 8.1* 8.4*  HCT 25.0* 24.6* 26.3* 26.2* 26.6*  MCV 105.9* 106.0* 104.0* 106.9* 106.0*  PLT 219 239 217 167 865    Basic Metabolic Panel: Recent Labs  Lab 07/10/22 0217 07/11/22 0253 07/12/22 0600 07/13/22 0555 07/14/22 0550 07/15/22 0447  NA 148* 148*  --  145 142 143  K 4.0 4.3  --  4.4 4.0 3.7  CL 122* 119*  --  112* 114* 112*  CO2 24 26  --  '26 26 27  '$ GLUCOSE 173* 132*  --  95 93 119*  BUN 48* 40*  --  31* 28* 22  CREATININE 0.88 0.97  --  0.87 0.88 0.75  CALCIUM 8.5* 8.4*  --  9.2 8.8* 9.2  MG 2.8* 2.8* 2.7* 2.7* 2.6* 2.3  PHOS 2.6 2.8 2.7  --   --   --     GFR: Estimated Creatinine Clearance: 81.1 mL/min (by C-G formula based on SCr of 0.75 mg/dL). Liver Function Tests: Recent Labs  Lab 07/14/22 0550 07/15/22 0447  AST 102* 86*  ALT 90* 83*  ALKPHOS 251* 280*  BILITOT 0.6 1.0  PROT 5.2* 5.1*  ALBUMIN 1.7* 1.7*    No results for input(s): "LIPASE", "AMYLASE" in the last  168 hours. Recent Labs  Lab 07/09/22 1337 07/13/22 0555  AMMONIA 24 26    Coagulation Profile: Recent Labs  Lab 07/09/22 1844  INR 1.2    Cardiac Enzymes: No results for input(s): "CKTOTAL", "CKMB", "CKMBINDEX", "TROPONINI" in the last 168 hours. BNP (last 3 results) No results for input(s): "PROBNP" in the last 8760 hours. HbA1C: No results for input(s): "HGBA1C" in the last 72 hours. CBG: Recent Labs  Lab 07/14/22 1610 07/14/22 2056 07/14/22 2348 07/15/22 0326 07/15/22 0744  GLUCAP 85 74 89 73 86    Lipid Profile: No results for input(s): "CHOL", "HDL", "LDLCALC", "TRIG", "CHOLHDL", "LDLDIRECT" in the last 72 hours. Thyroid Function Tests: No results for input(s): "TSH", "T4TOTAL", "FREET4", "T3FREE", "THYROIDAB" in the last 72 hours. Anemia Panel: No results for input(s): "VITAMINB12", "FOLATE", "FERRITIN", "TIBC", "IRON", "RETICCTPCT" in the last 72 hours. Sepsis Labs: No results for input(s): "PROCALCITON", "LATICACIDVEN" in the last 168 hours.  No results found for this or any previous visit (from the past 240 hour(s)).        Radiology Studies: No results found.      Scheduled Meds:  amiodarone  200 mg Oral BID   apixaban  5 mg Per Tube BID   Chlorhexidine Gluconate Cloth  6 each Topical Daily   feeding supplement  237 mL Oral TID BM   folic acid  1 mg Oral Daily   Gerhardt's butt cream   Topical BID   insulin aspart  0-9 Units Subcutaneous Q4H   lactulose  20 g Oral BID   metoprolol tartrate  12.5 mg Oral Q8H   midodrine  10 mg Oral TID WC   mupirocin cream   Topical Daily   mouth rinse  15 mL Mouth Rinse 4 times per day   rifaximin  550 mg Oral BID   spironolactone  100 mg Oral Daily   thiamine  100 mg Oral Daily   Continuous Infusions:  sodium chloride Stopped (07/08/22 2112)   lactated ringers 80 mL/hr at 07/15/22 0435          Aline August, MD [facility] 07/15/2022, 7:46 AM

## 2022-07-15 NOTE — Progress Notes (Signed)
Minimal PO and medication intake with maximal encouragement by staff and family.  Patient refused lactulose this morning.  He is now refusing midodrine, metoprolol and ensure.  For lunch, patient took sips of tea x 2 and 2 small bites of magic cup ice cream.  Patient refused all other PO fluids and food.  Dr. Starla Link and palliative care team notified.  Wife at bedside.  Per wife, no NGT.

## 2022-07-15 NOTE — TOC Progression Note (Signed)
Transition of Care [facility]) - Progression Note    Patient Details  Name: Darrell Horne MRN: [id number] Date of Birth: 08-26-1946  Transition of Care [facility]) CM/SW [place], RN Phone Number:[phone]  07/15/2022, 2:41 PM  Clinical Narrative:    TOC continues to follow for disposition planning. Family contemplating hospice/comfort measures.   Expected Discharge Plan: [place] Barriers to Discharge: Continued Medical Work up, Ship broker  Expected Discharge Plan and Services Expected Discharge Plan: [place] Choice: [place] arrangements for the past 2 months: Apartment                                       Social Determinants of Health (SDOH) Interventions    Readmission Risk Interventions     No data to display

## 2022-07-16 MED ORDER — POLYVINYL ALCOHOL 1.4 % OP SOLN: 1.0000 [drp] | Freq: Four times a day (QID) | OPHTHALMIC | Status: AC | PRN

## 2022-07-16 MED ORDER — MORPHINE SULFATE (CONCENTRATE) 10 MG/0.5ML PO SOLN: 5.0000 mg | ORAL | Status: AC | PRN

## 2022-07-16 MED ORDER — LORAZEPAM 1 MG PO TABS: 1.0000 mg | ORAL_TABLET | ORAL | Status: AC | PRN

## 2022-07-16 NOTE — Progress Notes (Signed)
   Palliative Medicine Inpatient Follow Up Note HPI: 76 year old male with a past medical history of paroxysmal A-fib on Eliquis, left BKA, HTN, HLD, and alcoholic cirrhosis who presented to the hospital with altered mental status and was admitted on 06/16/2022. He was found to have a large right-sided pleural effusion and his intermittently increased since August 26.  Thoracentesis on 06/17/2022 with 2 L of yellow fluid was found to be transudative pleural effusion. Post procedure developed increased oxygen requirements and hypotension. 06/22/2022 PCCM consulted for evaluation and management of his right-sided pleural effusion and acute hypoxic respiratory failure.   Today's Discussion 07/16/2022  *Please note that this is a verbal dictation therefore any spelling or grammatical errors are due to the "[place]" system interpretation.  Chart reviewed inclusive of vital signs, progress notes, laboratory results, and diagnostic images. Has not required morphine overnight.  I met with Olivia Mackie at bedside this morning. He was resting comfortably in NAD. He is aware of self though does not know where he is or why. Discussed in brief his complicated hospitalization which he was surprised by.  Denies pain, shortness of breath, or nausea.  I spoke to patients spouse, [name] she and I discussed the differences between a hospice facility, home hospice, and in hospital comfort care. She has elected to pursue [facility] in [place] at this time.   Questions and concerns addressed/Palliative Support Provided.   Objective Assessment: Vital Signs Vitals:   07/15/22 1624 07/16/22 0300  BP: 92/75 105/62  Pulse: 76 93  Resp: 16 19  Temp:  98.1 F (36.7 C)  SpO2: 100% 99%    Intake/Output Summary (Last 24 hours) at 07/16/2022 8588 Last data filed at 07/16/2022 0304 Gross per 24 hour  Intake 1568.48 ml  Output 725 ml  Net 843.48 ml    Last Weight  Most recent update: 07/15/2022  5:30 AM     Weight  85.4 kg (188 lb 4.4 oz)            Gen: Critically Ill  Elderly Caucasian M  HEENT:dry mucous membranes CV: Irregular rate and rhythm  PULM: On RA ABD: soft/nontender  EXT: LLE BKA Neuro:  Alert to self  SUMMARY OF RECOMMENDATIONS   DNAR/DNI  Comfort Measures  Plan for evaluation by [facility] Liaison for [facility]  Ongoing Palliative support  Prognosis < 2 weeks  Billing based on MDM: [place] ______________________________________________________________________________________ [place] Team Team Cell Phone: [phone] Please utilize secure chat with additional questions, if there is no response within 30 minutes please call the above phone number  Palliative Medicine Team providers are available by phone from 7am to 7pm daily and can be reached through the team cell phone.  Should this patient require assistance outside of these hours, please call the patient's attending physician.

## 2022-07-16 NOTE — Progress Notes (Signed)
Report called to [facility]

## 2022-07-16 NOTE — TOC Progression Note (Signed)
Transition of Care [facility]) - Progression Note    Patient Details  Name: Darrell Horne MRN: [id number] Date of Birth: 1946-03-09  Transition of Care [facility]) CM/SW [place], [place] Phone Number: 07/16/2022, 9:20 AM  Clinical Narrative:     Per Palliative NP, pt choosing hospice, requesting bed at [facility]. CSW sent referral, [facility] RN will be by this afternoon to assess pt. TOC will continue to follow for disposition needs.   Expected Discharge Plan: [place] Barriers to Discharge: Continued Medical Work up, Ship broker  Expected Discharge Plan and Services Expected Discharge Plan: [place] Choice: [place] arrangements for the past 2 months: Apartment                                       Social Determinants of Health (SDOH) Interventions    Readmission Risk Interventions     No data to display

## 2022-07-16 NOTE — Discharge Summary (Signed)
Physician Discharge Summary  Darrell Horne MGQ:[id number] DOB: 10-09-1946 DOA: 06/16/2022  PCP: Sandrea Hughs, NP  Admit date: 06/16/2022 Discharge date: 07/16/2022  Admitted From: Home Disposition: Residential hospice  Recommendations for Outpatient Follow-up:  Follow up with residential hospice provider at earliest [place]: No Equipment/Devices: None  Discharge Condition: Poor  CODE STATUS: DNR Diet recommendation: As per comfort measures  Brief/Interim Summary: 76 y.o. male with medical history significant for paroxysmal atrial fibrillation chronically anticoagulated on Eliquis, status post left BKA, GERD, possible alcoholic cirrhosis, essential hypertension, hyperlipidemia, recent hospitalization for encephalopathy secondary to unintentional overdose and subsequently discharged to SNF presented to the hospital with altered mental status and hypoxia from skilled nursing facility.  He was found to have moderate to large right-sided pleural effusion; CT of the head was negative.  He has had a long hospitalization.  Due to worsening respiratory status, PCCM had to be consulted.  He needed transfer to ICU.  He was treated with diuresis along with thoracentesis and bilateral chest tube placement and subsequent removal of chest tube.  On 07/01/2022, he developed progressive confusion; started on broad-spectrum antibiotics and he was subsequently intubated on 07/04/2022 and transferred to ICU care.  He had bilateral thoracentesis on 07/05/2022.  He also had A-fib with RVR requiring cardiology consultation and amiodarone.  He was subsequently extubated on 07/11/2022.  CODE STATUS has been changed to DNR.  Palliative care has also been involved in conversation with family.   Significant Hospital events: 9/8 thoracentesis with return of 1.2 L of transudative fluid 06/18/2022: Stage II partial-thickness left medial buttock pressure injury noted low 9/12 increasing oxygen requirements with  up to 6 L high flow nasal cannula and episodes of hypotension with systolic of 89.  Albumin, midodrine, and tube feeds started.  Core track placed. 9/13 PCCM consulted, Eliquis held for planned thoracentesis tomorrow. CXR with stable pulm edema and pleural effusions 9/14 increased O2 requirements, stable on BiPAP now. Bilateral chest tubes placed with return of 6 L of yellow fluid. Off BiPAP 9/15 doing well off BiPAP overnight, on 5L [place], slowed drainage from bilat chest tubes, drastically improved peripheral edema.  9/16 off bipap. Remains off pressors. Stable to transfer out of ICU  9/19 - Lactulose held due to diarrhea . More responsive today. Bilateral chest tubes clamped since 9/18.  Transudative pleural effusion deemed due to cirrhosis and cardiac impairment.  Removal of chest tubes.  Oxygen level 96% on room air.  With diuresis he is -18 L.  Delirium thought to be slowly improving ammonia level 20.  Lactulose is being continued.  For A-fib oral amiodarone and oral metoprolol continued with Eliquis.  Being maintained on midodrine as well.   Critical care medicine signed off 06/29/2022: Continue diuresis.  Improved alertness noticed. 07/01/2022: Worsening confusion but also fluctuating confusion.  Continue diuresis. 07/02/2022: Lethargic worsening confusion.  Afebrile per hospitalist 07/03/2022: PCCM Consulted for confusion and respiratory distress, transferred to ICU 9/25: Intubated  9/26: Bilateral thoracentesis w/ 1200 cc's serosnanguinous fluid drained from right pleural space and 1500 ccs of yellowish fluid from left pleural space. Had large left-sided pneumothorax afterward with pigtail chest tube placed 9/30 weaning precedex, back on low dose NE, bloody ETT secretions> heparin held 10/2: Extubated and Left Pigtail chest tube removed. 10/4: Transferred back to [facility] service 07/15/2022: Due to overall poor prognosis and worsening condition, family decided to proceed with comfort measures.  He was  switched to comfort measures only on 07/15/2022.  He  will be discharged to residential hospice once bed is available.  Discharge Diagnoses:   Comfort measures only status Acute respiratory failure with hypoxia, multifactorial superimposed pneumonia with ESBL Klebsiella Acute on chronic bilateral pleural effusions Iatrogenic left pneumothorax, resolved Acute metabolic encephalopathy Decompensated alcoholic cirrhosis of liver with ascites, pleural effusion, volume overload, portal hypertension Coagulopathy Bilateral hydrothorax Elevated LFTs Hypotension Paroxysmal A-fib with RVR Prolonged Qtc Nonsustained V. Tach Severe protein calorie malnutrition Anemia of chronic disease Hypernatremia Generalized deconditioning  Plan --As discussed above, he has been switched to comfort measures only status on 07/15/2022.  Discharge to residential hospice once bed is available   Discharge Instructions  Discharge Instructions     Diet - low sodium heart healthy   Complete by: As directed    Discharge wound care:   Complete by: As directed    Wound care as per Wound care RN's recommendations   Increase activity slowly   Complete by: As directed       Allergies as of 07/16/2022   No Known Allergies      Medication List     STOP taking these medications    alum & mag hydroxide-simeth 400-400-40 MG/5ML suspension Commonly known as: MAALOX PLUS   amiodarone 200 MG tablet Commonly known as: PACERONE   amitriptyline 50 MG tablet Commonly known as: ELAVIL   apixaban 5 MG Tabs tablet Commonly known as: Eliquis   ascorbic acid 500 MG tablet Commonly known as: VITAMIN C   atorvastatin 40 MG tablet Commonly known as: LIPITOR   bisacodyl 10 MG suppository Commonly known as: DULCOLAX   bisacodyl 5 MG EC tablet Commonly known as: DULCOLAX   cyanocobalamin 1000 MCG tablet Commonly known as: VITAMIN B12   Dramamine 25 MG tablet Generic drug: meclizine   folic acid 1 MG  tablet Commonly known as: FOLVITE   gabapentin 600 MG tablet Commonly known as: NEURONTIN   ibuprofen 200 MG tablet Commonly known as: ADVIL   ketoconazole 2 % cream Commonly known as: NIZORAL   lidocaine 5 % Commonly known as: LIDODERM   loperamide 2 MG tablet Commonly known as: IMODIUM A-D   magnesium hydroxide 400 MG/5ML suspension Commonly known as: MILK OF MAGNESIA   metoprolol succinate 25 MG 24 hr tablet Commonly known as: TOPROL-XL   nystatin cream Commonly known as: MYCOSTATIN   omeprazole 20 MG capsule Commonly known as: PRILOSEC   polyethylene glycol 17 g packet Commonly known as: MIRALAX / GLYCOLAX   Pro-Stat AWC Liqd   promethazine 25 MG tablet Commonly known as: PHENERGAN   Robitussin Child Cough/Cold CF 2.02-11-49 MG/5ML Liqd Generic drug: Phenylephrine-DM-GG   silver sulfADIAZINE 1 % cream Commonly known as: SILVADENE   topiramate 50 MG tablet Commonly known as: TOPAMAX       TAKE these medications    acetaminophen 500 MG tablet Commonly known as: TYLENOL Take 1 tablet (500 mg total) by mouth every 6 (six) hours as needed for moderate pain. What changed: Another medication with the same name was removed. Continue taking this medication, and follow the directions you see here.   LORazepam 1 MG tablet Commonly known as: ATIVAN Take 1 tablet (1 mg total) by mouth every 4 (four) hours as needed for anxiety.   morphine CONCENTRATE 10 MG/0.5ML Soln concentrated solution Place 0.25 mLs (5 mg total) under the tongue every 3 (three) hours as needed for severe pain, moderate pain or shortness of breath.   polyvinyl alcohol 1.4 % ophthalmic solution Commonly known as: LIQUIFILM TEARS  Place 1 drop into both eyes 4 (four) times daily as needed for dry eyes.               Discharge Care Instructions  (From admission, onward)           Start     Ordered   07/16/22 0000  Discharge wound care:       Comments: Wound care as per Wound  care RN's recommendations   07/16/22 1123            Contact information for after-discharge care     Destination     [facility] .   Service: Skilled Nursing Contact information: [address] [place] [phone]                    No Known Allergies  Consultations: PCCM/cardiology/palliative care   Procedures/Studies: DG Abd 1 View  Result Date: 07/10/2022 CLINICAL DATA:  Nasogastric tube placement. EXAM: ABDOMEN - 1 VIEW COMPARISON:  Abdominal radiograph 06/21/2022 FINDINGS: Nasogastric tube tip projects at the medial right upper abdomen, at the expected location of the gastric antrum versus duodenal bulb. Surgical clips are noted in the right upper abdomen. The bowel gas pattern is normal. No radio-opaque calculi or other significant radiographic abnormality are seen. IMPRESSION: Nasogastric tube tip projects in the medial right upper abdomen, at the expected location of the gastric antrum versus duodenal bulb. Normal bowel gas pattern. Electronically Signed   By: Ileana Roup M.D.   On: 07/10/2022 17:13   DG Chest Port 1 View  Result Date: 07/09/2022 CLINICAL DATA:  Respiratory failure EXAM: PORTABLE CHEST 1 VIEW COMPARISON:  Film from earlier in the same day. FINDINGS: Cardiac shadow is stable. Endotracheal tube and feeding catheter are again seen stable. Right PICC is noted in satisfactory position. Pigtail catheter is noted on the left. No pneumothorax is seen. No focal infiltrate or effusion is seen. No bony abnormality is noted. IMPRESSION: Tubes and lines as described above. No acute infiltrate noted. Electronically Signed   By: Inez Catalina M.D.   On: 07/09/2022 19:45   DG CHEST PORT 1 VIEW  Result Date: 07/09/2022 CLINICAL DATA:  Respiratory failure. Pleural effusion. Follow-up study. EXAM: PORTABLE CHEST 1 VIEW COMPARISON:  07/05/2022 and older exams. FINDINGS: Blunting of the lateral left costophrenic  sulcus is consistent with a small effusion. Mild basilar atelectasis, stable. Remainder of the lungs is clear. Left lateral mid to upper hemithorax chest tube, unchanged in position. No pneumothorax. Endotracheal tube, enteric tube and right-sided PICC are stable in well positioned. IMPRESSION: 1. No pneumothorax.  Stable left sided chest tube. 2. Probable small left pleural effusion. Mild basilar atelectasis. No evidence of pneumonia or pulmonary edema. 3. Remaining support apparatus is stable and well positioned. Electronically Signed   By: Lajean Manes M.D.   On: 07/09/2022 08:03   DG CHEST PORT 1 VIEW  Result Date: 07/05/2022 CLINICAL DATA:  Chest tube placement EXAM: PORTABLE CHEST 1 VIEW COMPARISON:  5:47 p.m. FINDINGS: Left pigtail chest tube is been inserted and the left pneumothorax has been completely evacuated. Lungs are clear. No pneumothorax or pleural effusion. Right upper extremity PICC line tip seen at the superior cavoatrial junction. Cardiac size within normal limits. Endotracheal tube and nasoenteric feeding tube with its tip within the expected mid body of the stomach are unchanged. IMPRESSION: Interval left chest tube placement with evacuation of the left pneumothorax. Electronically Signed   By: Cassandria Anger  Christa See M.D.   On: 07/05/2022 19:10   DG CHEST PORT 1 VIEW  Result Date: 07/05/2022 CLINICAL DATA:  Status post thoracentesis. EXAM: PORTABLE CHEST 1 VIEW COMPARISON:  July 04, 2022 FINDINGS: There is stable endotracheal tube, nasogastric tube and right-sided PICC line positioning. The heart size and mediastinal contours are within normal limits. A moderate to large left-sided pneumothorax is seen. This occupies approximately 40% of the left lung volume. Mild atelectasis is seen within the infrahilar regions, bilaterally. No pleural effusion is identified. The visualized skeletal structures are unremarkable. IMPRESSION: Moderate to large left-sided pneumothorax, as described above.  Electronically Signed   By: Virgina Norfolk M.D.   On: 07/05/2022 18:08   DG CHEST PORT 1 VIEW  Result Date: 07/04/2022 CLINICAL DATA:  PICC line placement EXAM: PORTABLE CHEST 1 VIEW COMPARISON:  Previous studies including the examination done earlier today FINDINGS: There is interval placement of PICC line through the right upper extremity with its tip in the region of superior vena cava. Tip of endotracheal tube is 5.4 cm above the carina. Distal portion of enteric tube is seen in the stomach. Transverse diameter of heart is slightly increased. Diffuse increase is noted in interstitial and alveolar markings in both lungs with interval worsening. Small bilateral pleural effusions are seen, more so on the right side. There is no pneumothorax. IMPRESSION: Tip of PICC line is seen in the region of superior vena cava. There is interval worsening of interstitial and alveolar densities suggesting worsening pulmonary edema or worsening multifocal pneumonia. Small bilateral pleural effusions, more so on the right side with interval increase. Electronically Signed   By: Elmer Picker M.D.   On: 07/04/2022 13:31   DG CHEST PORT 1 VIEW  Result Date: 07/04/2022 CLINICAL DATA:  76 year old male with a history of tachypnea EXAM: PORTABLE CHEST 1 VIEW COMPARISON:  07/03/2022 FINDINGS: Cardiac diameter unchanged. Endotracheal tube has been placed in the interval, terminating 5.2 cm above the carina. Unchanged enteric tube terminating out of the field of view. Improving aeration, with interlobular septal thickening and some persisting interstitial opacities. No pneumothorax. Blunting of the costophrenic angles bilaterally. IMPRESSION: Improved aeration of the lungs compared to yesterday's chest x-ray, with persisting interstitial disease and bilateral pleural effusions. Interval placement of endotracheal tube terminating suitably above the carina. Unchanged enteric feeding tube Electronically Signed   By: Corrie Mckusick D.O.   On: 07/04/2022 10:06   CT HEAD WO CONTRAST (5MM)  Result Date: 07/04/2022 CLINICAL DATA:  76 year old male with altered mental status. EXAM: CT HEAD WITHOUT CONTRAST TECHNIQUE: Contiguous axial images were obtained from the base of the skull through the vertex without intravenous contrast. RADIATION DOSE REDUCTION: This exam was performed according to the departmental dose-optimization program which includes automated exposure control, adjustment of the mA and/or kV according to patient size and/or use of iterative reconstruction technique. COMPARISON:  Brain MRI 07/02/2022.  Head CT 06/16/2022. FINDINGS: Brain: Stable cerebral volume. No midline shift, ventriculomegaly, mass effect, evidence of mass lesion, intracranial hemorrhage or evidence of cortically based acute infarction. Stable and largely normal for age gray-white differentiation throughout the brain. No cortical encephalomalacia identified. Vascular: Calcified atherosclerosis at the skull base. No suspicious intracranial vascular hyperdensity. Skull: No acute osseous abnormality identified. Sinuses/Orbits: Stable opacification of the right sphenoid sinus. Other Visualized paranasal sinuses and mastoids are stable and well aerated. Other: New left side nasoenteric tube from the earlier CT. No acute orbit or scalp soft tissue finding. IMPRESSION: No acute intracranial abnormality, stable noncontrast  CT appearance of the brain. Electronically Signed   By: Genevie Ann M.D.   On: 07/04/2022 04:03   [place] EKG SITE RITE  Result Date: 07/03/2022 If Site Rite image not attached, placement could not be confirmed due to current cardiac rhythm.  DG CHEST PORT 1 VIEW  Result Date: 07/03/2022 CLINICAL DATA:  Altered mental status and fever EXAM: PORTABLE CHEST 1 VIEW COMPARISON:  Radiographs 07/02/2022 FINDINGS: Subdiaphragmatic enteric tube. Increased hazy opacities in both lungs since 07/02/2022. Probable small bilateral pleural effusions. Stable  cardiomediastinal silhouette. No acute osseous abnormality. IMPRESSION: Increasing diffuse airspace opacities bilaterally. Differential considerations include infection or edema. Probable small bilateral pleural effusions. Electronically Signed   By: Placido Sou M.D.   On: 07/03/2022 19:41   MR BRAIN WO CONTRAST  Result Date: 07/02/2022 CLINICAL DATA:  Delirium. EXAM: MRI HEAD WITHOUT CONTRAST TECHNIQUE: Multiplanar, multiecho pulse sequences of the brain and surrounding structures were obtained without intravenous contrast. COMPARISON:  CT head without contrast 06/16/2022. MR head without contrast 08/05/2015 FINDINGS: Brain: No acute infarct, hemorrhage, or mass lesion is present. Moderate generalized atrophy has progressed since 2016. Mild white matter changes are stable and likely within normal limits for age. The ventricles are proportionate to the degree of atrophy. No significant extraaxial fluid collection is present. Dilated perivascular spaces are present within the basal ganglia. The internal auditory canals are within normal limits. The brainstem and cerebellum are within normal limits. Vascular: Flow is present in the major intracranial arteries. Skull and upper cervical spine: Degenerative changes are present at C2-3. Craniocervical junction is normal. Marrow signal is normal. The patient is edentulous. Sinuses/Orbits: The paranasal sinuses and mastoid air cells are clear. Bilateral lens replacements are noted. Globes and orbits are otherwise unremarkable. IMPRESSION: 1. No acute intracranial abnormality. 2. Progressive moderate generalized atrophy. This likely reflects the sequela of chronic microvascular ischemia. Electronically Signed   By: San Morelle M.D.   On: 07/02/2022 17:40   DG CHEST PORT 1 VIEW  Result Date: 07/02/2022 CLINICAL DATA:  [id number] altered mental status EXAM: PORTABLE CHEST 1 VIEW COMPARISON:  September 20-22, 2023 FINDINGS: Evaluation is limited by patient  rotation. Cardiomediastinal silhouette is grossly unchanged. The enteric tube courses through the chest to the abdomen beyond the field-of-view. There is diffuse hazy airspace opacities bilaterally with an upper lung predominance, overall increased since September 20th. There is increased confluent opacity of bilateral apices. A definitive pneumothorax is not identified. Likely small bilateral pleural effusions. IMPRESSION: Increasing upper lobe predominant opacities bilaterally with increased apical confluence. Differential considerations include infection and atypical pulmonary edema. Electronically Signed   By: Valentino Saxon M.D.   On: 07/02/2022 12:07   DG CHEST PORT 1 VIEW  Result Date: 07/01/2022 CLINICAL DATA:  Follow-up abnormal chest x-ray. EXAM: PORTABLE CHEST 1 VIEW COMPARISON:  June 30, 2022 FINDINGS: There is stable nasogastric tube positioning. The heart size and mediastinal contours are within normal limits. Mild infiltrates are seen within the mid to upper right lung and left lung base. This is mildly increased in severity when compared to the prior study. Small bilateral pleural effusions are noted. A tiny, stable left apical pneumothorax is seen. The visualized skeletal structures are unremarkable. IMPRESSION: 1. Mild mid to upper right lung and left basilar infiltrates, mildly increased in severity when compared to the prior study. 2. Stable tiny left apical pneumothorax. 3. Small bilateral pleural effusions. Electronically Signed   By: Virgina Norfolk M.D.   On: 07/01/2022 17:26   DG CHEST  PORT 1 VIEW  Result Date: 06/30/2022 CLINICAL DATA:  [id number], pneumothorax or pleural effusions. EXAM: PORTABLE CHEST 1 VIEW COMPARISON:  Portable chest yesterday at 5:40 a.m. FINDINGS: 5:16 a.m. Minimally small left apical pneumothorax continues to be seen and is unchanged. Previously there was a small apical right pneumothorax with fluid having extending into the area. There are increased  layering pleural effusions and overlying opacities in the lower lung fields consistent with atelectasis or consolidation. There is cardiomegaly with increased perihilar vascular congestion and mild central interstitial edema. The upper lung fields remain clear of focal infiltrates. In all other respects no further changes. IMPRESSION: 1. Right apical pneumothorax is not seen today with pleural fluid having extended into the apical right chest with the pneumo component no longer visible. 2. Minimal left apical pneumothorax is unchanged. 3. Increased perihilar vascular congestion with mild central edema. 4. Increased pleural effusions and overlying atelectasis or consolidation. Electronically Signed   By: Telford Nab M.D.   On: 06/30/2022 06:23   DG Chest Port 1 View  Result Date: 06/29/2022 CLINICAL DATA:  [id number], [id number].  Pneumothorax and pleural effusions. EXAM: PORTABLE CHEST 1 VIEW COMPARISON:  Portable chest yesterday at 6:13 a.m. FINDINGS: 5:40 a.m. Bilateral basilar pigtail chest tubes have been removed in the interval. Feeding tube enters the stomach with the full intragastric course not filmed but is well inside the stomach. There is a questionable new minimal, 3% or less volume left pneumothorax following chest tube removal. Attention on follow-up recommended. Right hydropneumothorax with the pneumo component in the apex estimated 5% volume, the hydro component with small layering pleural effusion, is essentially unchanged as well as a small left pleural effusion. The heart is slightly enlarged but the central vessels are normal caliber. There is patchy hazy consolidation or atelectasis in the lower lung fields, slightly worsened today. The upper lung fields remain clear of focal opacity. In all other respects no further changes. IMPRESSION: 1. Interval removal of bilateral chest tubes. 2. Right hydropneumothorax is not significantly changed. 3. Query minimal new left apical pneumothorax. Attention  on follow-up recommended. 4. Layering pleural effusions with increased opacity in the lower lung fields consistent with atelectasis or consolidation. Electronically Signed   By: Telford Nab M.D.   On: 06/29/2022 07:56   DG Chest Port 1 View  Result Date: 06/28/2022 CLINICAL DATA:  Pneumothorax.  Pleural effusion. EXAM: PORTABLE CHEST 1 VIEW COMPARISON:  AP chest 06/27/2022, 06/26/2022, 06/24/2022; CT chest 01/16/2012 FINDINGS: Cardiac silhouette is at the upper limits of normal size. Mediastinal contours are within normal limits. Enteric tube descends below the diaphragm and curls along the stomach, likely exiting the stomach off the inferior plane of view into the duodenum. Right basilar pigtail drainage catheter is again seen. There is less of a right ankle turned at the skin surface compared to 06/27/2022 radiograph, and is now more similar to the turned seen on 06/24/2022 radiograph. Left basilar pigtail drainage catheter is unchanged, overlying the left costophrenic angle. Small right apical pneumothorax measuring approximately 13 mm in craniocaudal dimension appears not significantly changed from prior. Mild left greater than right basilar heterogeneous airspace opacities, overall mildly improved from 06/27/2022 and definitively improved from 06/26/2022 and 06/24/2022. Likely small left pleural effusion, unchanged. No significant skeletal abnormality. Surgical screws overlie the left humeral head. Cholecystectomy clips. IMPRESSION: 1. Bibasilar pigtail drainage catheter tips appear unchanged in position. 2. Small right apical pneumothorax appears unchanged. 3. Improved mild left-greater-than-right basilar airspace opacities with likely unchanged small left pleural  effusion. Electronically Signed   By: Yvonne Kendall M.D.   On: 06/28/2022 08:25   DG CHEST PORT 1 VIEW  Result Date: 06/27/2022 CLINICAL DATA:  76 year old male presents for evaluation of chest tube, history of pleural effusion. EXAM:  PORTABLE CHEST 1 VIEW COMPARISON:  June 26, 2022 FINDINGS: EKG leads project over the chest. Feeding tube remains in place tip off the field of view, beyond the distal stomach. Bilateral chest tubes remain in place the RIGHT-sided chest tube makes a RIGHT angle turn at the skin surface not frankly kinked but with change in course along the chest wall none the less. LEFT-sided chest tube has been retracted, pigtail remains within the thoracic cavity. Retraction of approximately 4 cm since the prior exam. Cardiomediastinal contours and hilar structures are stable. Small RIGHT apical pneumothorax is similar accounting for changes in projection on the current study. Small graded opacity in the RIGHT chest is accentuated by lordotic positioning and diaphragmatic contour. No LEFT-sided pneumothorax.  Trace LEFT-sided effusion. On limited assessment no acute skeletal findings. IMPRESSION: 1. Retraction of LEFT-sided chest tube, pigtail remains within the thoracic cavity. Retraction of approximately 4 cm since the prior exam. 2. Also with slight change in position of the RIGHT-sided chest tube which takes a 90 degree turn at the chest wall, correlate with tube position external to the patient and with tube function. 3. Stable small RIGHT apical pneumothorax is unchanged. 4. Suspect small bilateral pleural effusions which are grossly similar to previous imaging. These results will be called to the ordering clinician or representative by the Radiologist Assistant, and communication documented in the PACS or [employer]. Electronically Signed   By: Zetta Bills M.D.   On: 06/27/2022 08:33   US Abdomen Limited RUQ (LIVER/GB)  Result Date: 06/26/2022 CLINICAL DATA:  Cirrhosis EXAM: ULTRASOUND ABDOMEN LIMITED RIGHT UPPER QUADRANT COMPARISON:  None Available. FINDINGS: Gallbladder: Status post cholecystectomy. Common bile duct: Diameter: 0.2 cm Liver: Coarse, nodular cirrhotic morphology of the liver. No focal  lesion identified. Increased parenchymal echogenicity. Portal vein is patent on color Doppler imaging with normal direction of blood flow towards the liver. Other: Incidental note of right pleural effusion.  Trace ascites. IMPRESSION: 1. Cirrhotic morphology of the liver. No focal lesion identified. Please note that multiphasic contrast enhanced CT and MRI are the most sensitive test for the screening detection of hepatocellular carcinoma in the high risk setting of cirrhosis. 2. Status post cholecystectomy. 3. Trace ascites. 4. Incidental note of right pleural effusion. Electronically Signed   By: Delanna Ahmadi M.D.   On: 06/26/2022 13:25   DG CHEST PORT 1 VIEW  Result Date: 06/26/2022 CLINICAL DATA:  Follow-up chest tube placement for pneumothoraces EXAM: PORTABLE CHEST 1 VIEW COMPARISON:  06/24/2022 FINDINGS: Enteric tube tip is well below the level of the GE junction. Unchanged position of bilateral basilar pigtail thoracostomy tubes. Right apical pneumothorax is unchanged in volume from previous exam, less than 10%. Increase interstitial markings within both lungs appears similar to the previous exam. Patchy airspace opacities in the left base and periphery of the right lower lobe are stable. IMPRESSION: 1. Stable position of bilateral pigtail thoracostomy tubes with stable small right apical pneumothorax. 2. No change in aeration to the lungs compared with previous exam. Electronically Signed   By: Kerby Moors M.D.   On: 06/26/2022 07:52   DG CHEST PORT 1 VIEW  Result Date: 06/24/2022 CLINICAL DATA:  Respiratory failure and bilateral indwelling chest tubes. EXAM: PORTABLE CHEST 1 VIEW COMPARISON:  06/23/2022 FINDINGS: Stable heart size. Stable positioning of bilateral basilar pigtail thoracostomy tubes. No significant residual pleural fluid with probable small amount basilar pleural fluid bilaterally. Persistent small right apical pneumothorax of less than 10% volume. Increased pulmonary opacities  felt to represent interstitial edema bilaterally. Feeding tube extends into the stomach. IMPRESSION: 1. Persistent small right apical pneumothorax of less than 10% volume. 2. Bibasilar chest tubes remain with probable small amount of bilateral pleural fluid. 3. Increase in pulmonary interstitial edema. Electronically Signed   By: Aletta Edouard M.D.   On: 06/24/2022 08:32   DG CHEST PORT 1 VIEW  Result Date: 06/23/2022 CLINICAL DATA:  Chest tube placement EXAM: PORTABLE CHEST 1 VIEW COMPARISON:  06/22/2022 FINDINGS: New chest tubes bilaterally at the bases. Partially imaged enteric tube. Decreased pleural effusions with improved lung aeration. New lucency at the right apex likely reflecting a small pneumothorax. No definite left pneumothorax. Persistent bilateral pulmonary opacities. Stable cardiomediastinal contours. IMPRESSION: New bilateral chest tubes with small pneumothorax at the right apex. Decreased pleural effusions with improved basilar atelectasis. Otherwise similar bilateral pulmonary opacities. These results will be called to the ordering clinician or representative by the Radiologist Assistant, and communication documented in the PACS or [employer]. Electronically Signed   By: Macy Mis M.D.   On: 06/23/2022 10:12   DG CHEST PORT 1 VIEW  Result Date: 06/22/2022 CLINICAL DATA:  Shortness of breath. EXAM: PORTABLE CHEST 1 VIEW COMPARISON:  Chest x-ray 06/22/2022 FINDINGS: Enteric tube extends below the diaphragm. Moderate right and small left pleural effusions persist. Central interstitial and airspace opacities appear unchanged. No evidence for pneumothorax. The cardiomediastinal silhouette is stable and within normal limits. No acute fractures are seen. IMPRESSION: 1. Stable bilateral pulmonary infiltrates concerning for pulmonary edema. Pneumonia not excluded. 2. Stable pleural effusions. Electronically Signed   By: Ronney Asters M.D.   On: 06/22/2022 21:20   DG CHEST PORT 1  VIEW  Result Date: 06/22/2022 CLINICAL DATA:  Dyspnea. EXAM: PORTABLE CHEST 1 VIEW COMPARISON:  Chest x-ray from yesterday. FINDINGS: New feeding tube entering the stomach with the tip below the field of view. Stable cardiomediastinal silhouette. Diffuse bilateral airspace disease and interstitial thickening remains worse on the right, but has progressed on the left compared to prior. Unchanged moderate right and small left pleural effusions. No pneumothorax. No acute osseous abnormality. IMPRESSION: 1. Worsening pulmonary edema and/or pneumonia. 2. Unchanged moderate right and small left pleural effusions. 3. New feeding tube entering the stomach with the tip below the field of view. Electronically Signed   By: Titus Dubin M.D.   On: 06/22/2022 11:42   DG Abd Portable 1V  Result Date: 06/21/2022 CLINICAL DATA:  Feeding tube placement EXAM: PORTABLE ABDOMEN - 1 VIEW COMPARISON:  None Available. FINDINGS: Tip of feeding tube is seen in the distal antrum of the stomach. There is mild dilation of small bowel loops. IMPRESSION: Tip of feeding tube is seen in the distal antrum of the stomach. Electronically Signed   By: Elmer Picker M.D.   On: 06/21/2022 16:15   DG CHEST PORT 1 VIEW  Result Date: 06/21/2022 CLINICAL DATA:  Right pleural effusion EXAM: PORTABLE CHEST 1 VIEW COMPARISON:  Chest 06/17/2022 FINDINGS: Extensive infiltrate throughout the right lung which has progressed considerably. Moderate right effusion has progressed. Left lower lobe infiltrate and effusion with mild improvement. IMPRESSION: Extensive infiltrate throughout the right lung with progression. Progressive right pleural effusion. Possible pneumonia Left lower lobe infiltrate and effusion with mild improvement. Electronically  Signed   By: Franchot Gallo M.D.   On: 06/21/2022 15:51   ECHOCARDIOGRAM COMPLETE  Result Date: 06/20/2022    ECHOCARDIOGRAM REPORT   Patient Name:   ANTAWAN MCHUGH Date of Exam: 06/20/2022 Medical Rec  #:  [id number]     Height:       70.0 in Accession #:    [id number]    Weight:       219.1 lb Date of Birth:  October 22, 1945     BSA:          2.170 m Patient Age:    89 years      BP:           87/53 mmHg Patient Gender: M             HR:           64 bpm. Exam Location:  Inpatient Procedure: 2D Echo, Cardiac Doppler and Color Doppler Indications:    Pleural effusion  History:        Patient has prior history of Echocardiogram examinations, most                 recent 04/19/2016. Risk Factors:Hypertension, Diabetes and                 Dyslipidemia.  Sonographer:    Jefferey Pica Referring Phys: [id number] [place]  1. Left ventricular ejection fraction, by estimation, is 55 to 60%. The left ventricle has normal function. The left ventricle has no regional wall motion abnormalities. Left ventricular diastolic parameters are indeterminate.  2. Right ventricular systolic function is low normal. The right ventricular size is normal. There is mildly elevated pulmonary artery systolic pressure. The estimated right ventricular systolic pressure is 19.4 mmHg.  3. Large pleural effusion in the left lateral region.  4. The mitral valve is grossly normal. Mild to moderate mitral valve regurgitation.  5. The aortic valve was not well visualized. Aortic valve regurgitation is trivial. No aortic stenosis is present. Comparison(s): MR worse from prior report. FINDINGS  Left Ventricle: Left ventricular ejection fraction, by estimation, is 55 to 60%. The left ventricle has normal function. The left ventricle has no regional wall motion abnormalities. The left ventricular internal cavity size was small. There is no left ventricular hypertrophy. Left ventricular diastolic parameters are indeterminate. Right Ventricle: The right ventricular size is normal. No increase in right ventricular wall thickness. Right ventricular systolic function is low normal. There is mildly elevated pulmonary artery systolic pressure. The  tricuspid regurgitant velocity is 2.34 m/s, and with an assumed right atrial pressure of 15 mmHg, the estimated right ventricular systolic pressure is 17.4 mmHg. Left Atrium: Left atrial size was normal in size. Right Atrium: Right atrial size was normal in size. Pericardium: There is no evidence of pericardial effusion. Mitral Valve: The mitral valve is grossly normal. Mild to moderate mitral annular calcification. Mild to moderate mitral valve regurgitation. Tricuspid Valve: The tricuspid valve is normal in structure. Tricuspid valve regurgitation is not demonstrated. No evidence of tricuspid stenosis. Aortic Valve: The aortic valve was not well visualized. Aortic valve regurgitation is trivial. No aortic stenosis is present. Aortic valve peak gradient measures 6.5 mmHg. Pulmonic Valve: The pulmonic valve was not well visualized. Pulmonic valve regurgitation is not visualized. Aorta: The aortic root is normal in size and structure and the ascending aorta was not well visualized. IAS/Shunts: No atrial level shunt detected by color flow Doppler. Additional Comments: There is a large pleural effusion  in the left lateral region.  LEFT VENTRICLE PLAX 2D LVIDd:         3.60 cm LVIDs:         2.80 cm LV PW:         0.90 cm LV IVS:        1.00 cm LVOT diam:     1.80 cm LV SV:         45 LV SV Index:   21 LVOT Area:     2.54 cm  RIGHT VENTRICLE          IVC RV Basal diam:  3.10 cm  IVC diam: 2.10 cm LEFT ATRIUM             Index        RIGHT ATRIUM           Index LA diam:        3.60 cm 1.66 cm/m   RA Area:     14.00 cm LA Vol (A2C):   45.3 ml 20.88 ml/m  RA Volume:   34.70 ml  15.99 ml/m LA Vol (A4C):   43.6 ml 20.09 ml/m LA Biplane Vol: 46.7 ml 21.52 ml/m  AORTIC VALVE                 PULMONIC VALVE AV Area (Vmax): 1.54 cm     PV Vmax:       0.63 m/s AV Vmax:        127.00 cm/s  PV Peak grad:  1.6 mmHg AV Peak Grad:   6.5 mmHg LVOT Vmax:      76.70 cm/s LVOT Vmean:     51.800 cm/s LVOT VTI:       0.176 m  AORTA  Ao Root diam: 3.20 cm MR Peak grad: 48.2 mmHg   TRICUSPID VALVE MR Vmax:      347.00 cm/s TR Peak grad:   21.9 mmHg                           TR Vmax:        234.00 cm/s                            SHUNTS                           Systemic VTI:  0.18 m                           Systemic Diam: 1.80 cm Rudean Haskell MD Electronically signed by Rudean Haskell MD Signature Date/Time: 06/20/2022/11:13:16 AM    Final    IR THORACENTESIS ASP PLEURAL SPACE W/IMG GUIDE  Result Date: 06/17/2022 INDICATION: Patient with history of right pleural effusion. Request made for right thoracentesis. EXAM: ULTRASOUND GUIDED DIAGNOSTIC AND THERAPEUTIC RIGHT THORACENTESIS MEDICATIONS: 10 mL 1% lidocaine COMPLICATIONS: None immediate. PROCEDURE: An ultrasound guided thoracentesis was thoroughly discussed with the patient and questions answered. The benefits, risks, alternatives and complications were also discussed. The patient understands and wishes to proceed with the procedure. Written consent was obtained. Ultrasound was performed to localize and mark an adequate pocket of fluid in the right chest. The area was then prepped and draped in the normal sterile fashion. 1% Lidocaine was used for local anesthesia. Under ultrasound guidance a 6 Fr Safe-T-Centesis catheter was introduced. Thoracentesis was  performed. The catheter was removed and a dressing applied. FINDINGS: A total of approximately 1.2 liters of yellow fluid was removed. Samples were sent to the laboratory as requested by the clinical team. IMPRESSION: Successful ultrasound guided right thoracentesis yielding 1.2 liters of pleural fluid. Read by: Brynda Greathouse PA-C Electronically Signed   By: Miachel Roux M.D.   On: 06/17/2022 14:34   DG Chest 1 View  Result Date: 06/17/2022 CLINICAL DATA:  Post right thoracentesis with 1.3 L removed. EXAM: CHEST  1 VIEW COMPARISON:  06/16/2022 FINDINGS: Lungs are hypoinflated and demonstrate mild interval worsening of a  moderate size left effusion likely with associated basilar atelectasis. Mild interval improvement and right effusion post thoracentesis. Residual small to moderate right effusion likely with associated basilar atelectasis is present. No evidence of right-sided pneumothorax. Mild hazy perihilar opacification likely interstitial edema. Remainder of the exam is unchanged. IMPRESSION: 1. No pneumothorax post right thoracentesis. Residual small to moderate right effusion likely with associated basilar atelectasis. Slight interval worsening of moderate size left effusion likely with associated basilar atelectasis. 2. Suggestion of mild interstitial edema. Electronically Signed   By: Marin Olp M.D.   On: 06/17/2022 13:25   CT Head Wo Contrast  Result Date: 06/16/2022 CLINICAL DATA:  Mental status change, unknown cause EXAM: CT HEAD WITHOUT CONTRAST TECHNIQUE: Contiguous axial images were obtained from the base of the skull through the vertex without intravenous contrast. RADIATION DOSE REDUCTION: This exam was performed according to the departmental dose-optimization program which includes automated exposure control, adjustment of the mA and/or kV according to patient size and/or use of iterative reconstruction technique. COMPARISON:  Head CT 06/03/2022 FINDINGS: Brain: Patient had difficulty tolerating the exam, there is moderate motion artifact. Allowing for this, no acute hemorrhage, infarct, hydrocephalus, subdural/extra-axial collection. Stable degree of atrophy and mild periventricular chronic small vessel ischemia Vascular: No hyperdense vessel or unexpected calcification. Skull base atherosclerosis. Skull: No fracture or focal lesion. Sinuses/Orbits: Chronic opacification of right side of sphenoid sinus. No acute findings. Other: None. IMPRESSION: 1. No acute intracranial abnormality. 2. Stable atrophy and chronic small vessel ischemia. Electronically Signed   By: Keith Rake M.D.   On: 06/16/2022 15:17    DG Chest Port 1 View  Result Date: 06/16/2022 CLINICAL DATA:  Chest pain EXAM: PORTABLE CHEST 1 VIEW COMPARISON:  Previous studies including the examination of 06/04/2022 FINDINGS: There is interval increase in amount of right pleural effusion with moderate to large effusion on the right side. There is interval decrease in amount of left pleural effusion. Evaluation of underlying lung fields is difficult due to the effusions. Central pulmonary vessels are prominent. There is no pneumothorax. IMPRESSION: There is moderate to large right pleural effusion with interval increase. There is small to moderate left pleural effusion with possible decrease. Possibility of underlying atelectasis/pneumonia in the lower lung fields is not excluded. Electronically Signed   By: Elmer Picker M.D.   On: 06/16/2022 14:49      Subjective: Patient seen and examined at bedside.  Poor historian; slow to respond.  Looks comfortable.  Discharge Exam: Vitals:   07/15/22 1624 07/16/22 0300  BP: 92/75 105/62  Pulse: 76 93  Resp: 16 19  Temp:  98.1 F (36.7 C)  SpO2: 100% 99%   General: No acute distress.  Currently on room air.  Looks to be in no discomfort.     The results of significant diagnostics from this hospitalization (including imaging, microbiology, ancillary and laboratory) are listed below for reference.  Microbiology: No results found for this or any previous visit (from the past 240 hour(s)).   Labs: BNP (last 3 results) Recent Labs    06/19/22 0202 07/02/22 0244 07/03/22 0056  BNP 298.9* 375.1* 759.1*   Basic Metabolic Panel: Recent Labs  Lab 07/10/22 0217 07/11/22 0253 07/12/22 0600 07/13/22 0555 07/14/22 0550 07/15/22 0447  NA 148* 148*  --  145 142 143  K 4.0 4.3  --  4.4 4.0 3.7  CL 122* 119*  --  112* 114* 112*  CO2 24 26  --  '26 26 27  '$ GLUCOSE 173* 132*  --  95 93 119*  BUN 48* 40*  --  31* 28* 22  CREATININE 0.88 0.97  --  0.87 0.88 0.75  CALCIUM 8.5*  8.4*  --  9.2 8.8* 9.2  MG 2.8* 2.8* 2.7* 2.7* 2.6* 2.3  PHOS 2.6 2.8 2.7  --   --   --    Liver Function Tests: Recent Labs  Lab 07/14/22 0550 07/15/22 0447  AST 102* 86*  ALT 90* 83*  ALKPHOS 251* 280*  BILITOT 0.6 1.0  PROT 5.2* 5.1*  ALBUMIN 1.7* 1.7*   No results for input(s): "LIPASE", "AMYLASE" in the last 168 hours. Recent Labs  Lab 07/09/22 1337 07/13/22 0555  AMMONIA 24 26   CBC: Recent Labs  Lab 07/10/22 0217 07/11/22 0253 07/12/22 0600 07/13/22 0555 07/15/22 0447  WBC 7.7 9.7 9.2 9.1 6.2  NEUTROABS  --   --   --   --  3.8  HGB 7.8* 7.7* 8.4* 8.1* 8.4*  HCT 25.0* 24.6* 26.3* 26.2* 26.6*  MCV 105.9* 106.0* 104.0* 106.9* 106.0*  PLT 219 239 217 167 150   Cardiac Enzymes: No results for input(s): "CKTOTAL", "CKMB", "CKMBINDEX", "TROPONINI" in the last 168 hours. BNP: Invalid input(s): "POCBNP" CBG: Recent Labs  Lab 07/15/22 0326 07/15/22 0744 07/15/22 1124 07/15/22 1625 07/15/22 1725  GLUCAP 73 86 74 65* 144*   D-Dimer No results for input(s): "DDIMER" in the last 72 hours. Hgb A1c No results for input(s): "HGBA1C" in the last 72 hours. Lipid Profile No results for input(s): "CHOL", "HDL", "LDLCALC", "TRIG", "CHOLHDL", "LDLDIRECT" in the last 72 hours. Thyroid function studies No results for input(s): "TSH", "T4TOTAL", "T3FREE", "THYROIDAB" in the last 72 hours.  Invalid input(s): "FREET3" Anemia work up No results for input(s): "VITAMINB12", "FOLATE", "FERRITIN", "TIBC", "IRON", "RETICCTPCT" in the last 72 hours. Urinalysis    Component Value Date/Time   COLORURINE AMBER (A) 06/16/2022 2111   APPEARANCEUR HAZY (A) 06/16/2022 2111   LABSPEC 1.020 06/16/2022 2111   PHURINE 5.0 06/16/2022 2111   GLUCOSEU NEGATIVE 06/16/2022 2111   HGBUR NEGATIVE 06/16/2022 2111   BILIRUBINUR NEGATIVE 06/16/2022 2111   KETONESUR NEGATIVE 06/16/2022 2111   PROTEINUR NEGATIVE 06/16/2022 2111   UROBILINOGEN 0.2 07/08/2014 0137   NITRITE NEGATIVE  06/16/2022 2111   LEUKOCYTESUR NEGATIVE 06/16/2022 2111   Sepsis Labs Recent Labs  Lab 07/11/22 0253 07/12/22 0600 07/13/22 0555 07/15/22 0447  WBC 9.7 9.2 9.1 6.2   Microbiology No results found for this or any previous visit (from the past 240 hour(s)).   Time coordinating discharge: 35 minutes  SIGNED:   Aline August, MD  [facility] 07/16/2022, 11:24 AM

## 2022-07-16 NOTE — Progress Notes (Signed)
[facility] arrived to take pt via ambulance to [facility].  Paper work given to [employer].  Explained transfer to pt.  Pt agreeable to transfer.  VSS.  Wife, Mouhamadou Gittleman called and made aware of pick up to [facility].  Wife appreciative of call.

## 2022-07-16 NOTE — Progress Notes (Signed)
PROGRESS NOTE    Darrell Horne  HWE:[id number] DOB: March 31, 1946 DOA: 06/16/2022 PCP: Sandrea Hughs, NP   Brief Narrative:  76 y.o. male with medical history significant for paroxysmal atrial fibrillation chronically anticoagulated on Eliquis, status post left BKA, GERD, possible alcoholic cirrhosis, essential hypertension, hyperlipidemia, recent hospitalization for encephalopathy secondary to unintentional overdose and subsequently discharged to SNF presented to the hospital with altered mental status and hypoxia from skilled nursing facility.  He was found to have moderate to large right-sided pleural effusion; CT of the head was negative.  He has had a long hospitalization.  Due to worsening respiratory status, PCCM had to be consulted.  He needed transfer to ICU.  He was treated with diuresis along with thoracentesis and bilateral chest tube placement and subsequent removal of chest tube.  On 07/01/2022, he developed progressive confusion; started on broad-spectrum antibiotics and he was subsequently intubated on 07/04/2022 and transferred to ICU care.  He had bilateral thoracentesis on 07/05/2022.  He also had A-fib with RVR requiring cardiology consultation and amiodarone.  He was subsequently extubated on 07/11/2022.  CODE STATUS has been changed to DNR.  Palliative care has also been involved in conversation with family.  Significant Hospital events: 9/8 thoracentesis with return of 1.2 L of transudative fluid 06/18/2022: Stage II partial-thickness left medial buttock pressure injury noted low 9/12 increasing oxygen requirements with up to 6 L high flow nasal cannula and episodes of hypotension with systolic of 89.  Albumin, midodrine, and tube feeds started.  Core track placed. 9/13 PCCM consulted, Eliquis held for planned thoracentesis tomorrow. CXR with stable pulm edema and pleural effusions 9/14 increased O2 requirements, stable on BiPAP now. Bilateral chest tubes placed with return of 6 L of  yellow fluid. Off BiPAP 9/15 doing well off BiPAP overnight, on 5L [place], slowed drainage from bilat chest tubes, drastically improved peripheral edema.  9/16 off bipap. Remains off pressors. Stable to transfer out of ICU  9/19 - Lactulose held due to diarrhea . More responsive today. Bilateral chest tubes clamped since 9/18.  Transudative pleural effusion deemed due to cirrhosis and cardiac impairment.  Removal of chest tubes.  Oxygen level 96% on room air.  With diuresis he is -18 L.  Delirium thought to be slowly improving ammonia level 20.  Lactulose is being continued.  For A-fib oral amiodarone and oral metoprolol continued with Eliquis.  Being maintained on midodrine as well.   Critical care medicine signed off 06/29/2022: Continue diuresis.  Improved alertness noticed. 07/01/2022: Worsening confusion but also fluctuating confusion.  Continue diuresis. 07/02/2022: Lethargic worsening confusion.  Afebrile per hospitalist 07/03/2022: PCCM Consulted for confusion and respiratory distress, transferred to ICU 9/25: Intubated  9/26: Bilateral thoracentesis w/ 1200 cc's serosnanguinous fluid drained from right pleural space and 1500 ccs of yellowish fluid from left pleural space. Had large left-sided pneumothorax afterward with pigtail chest tube placed 9/30 weaning precedex, back on low dose NE, bloody ETT secretions> heparin held 10/2: Extubated and Left Pigtail chest tube removed. 10/4: Transferred back to [facility] service 07/15/2022: Due to overall poor prognosis and worsening condition, family decided to proceed with comfort measures.  He was switched to comfort measures only on 07/15/2022.  Assessment & Plan:   Comfort measures only status Acute respiratory failure with hypoxia, multifactorial superimposed pneumonia with ESBL Klebsiella Acute on chronic bilateral pleural effusions Iatrogenic left pneumothorax, resolved Acute metabolic encephalopathy Decompensated alcoholic cirrhosis of liver with  ascites, pleural effusion, volume overload, portal hypertension Coagulopathy Bilateral hydrothorax  Elevated LFTs Hypotension Paroxysmal A-fib with RVR Prolonged Qtc Nonsustained V. Tach Severe protein calorie malnutrition Anemia of chronic disease Hypernatremia Generalized deconditioning  Plan -As discussed above, he has been switched to comfort measures only status on 07/15/2022.  Might benefit from residential hospice placement.  DVT prophylaxis: None for comfort measures Code Status: DNR Family Communication: Wife at bedside on 07/13/2022 Disposition Plan: Status is: Inpatient Remains inpatient appropriate because: Of severity of illness.  Possible residential hospice placement.  Consultants: PCCM/cardiology/palliative care  Procedures: As above  Antimicrobials:  Anti-infectives (From admission, onward)    Start     Dose/Rate Route Frequency Ordered Stop   07/13/22 2200  rifaximin (XIFAXAN) tablet 550 mg  Status:  Discontinued        550 mg Oral 2 times daily 07/13/22 1518 07/15/22 1320   07/07/22 1300  vancomycin (VANCOREADY) IVPB 1500 mg/300 mL  Status:  Discontinued        1,500 mg 150 mL/hr over 120 Minutes Intravenous Every 24 hours 07/06/22 1354 07/07/22 1022   07/06/22 1430  meropenem (MERREM) 2 g in sodium chloride 0.9 % 100 mL IVPB        2 g 280 mL/hr over 30 Minutes Intravenous Every 8 hours 07/06/22 1339 07/13/22 1359   07/06/22 1300  vancomycin (VANCOREADY) IVPB 1500 mg/300 mL        1,500 mg 150 mL/hr over 120 Minutes Intravenous  Once 07/06/22 1202 07/06/22 1506   07/05/22 1115  vancomycin (VANCOREADY) IVPB 1500 mg/300 mL  Status:  Discontinued        1,500 mg 150 mL/hr over 120 Minutes Intravenous Every 24 hours 07/04/22 1016 07/05/22 0824   07/04/22 1100  vancomycin (VANCOREADY) IVPB 1500 mg/300 mL        1,500 mg 150 mL/hr over 120 Minutes Intravenous  Once 07/04/22 1014 07/04/22 1633   07/04/22 1000  rifaximin (XIFAXAN) tablet 550 mg  Status:   Discontinued        550 mg Per Tube 2 times daily 07/04/22 0839 07/12/22 2255   07/04/22 0900  piperacillin-tazobactam (ZOSYN) IVPB 3.375 g  Status:  Discontinued        3.375 g 12.5 mL/hr over 240 Minutes Intravenous Every 8 hours 07/04/22 0802 07/06/22 1339   07/03/22 1300  vancomycin (VANCOCIN) IVPB 1000 mg/200 mL premix  Status:  Discontinued        1,000 mg 200 mL/hr over 60 Minutes Intravenous Every 12 hours 07/03/22 0004 07/03/22 0739   07/03/22 0900  ceFEPIme (MAXIPIME) 2 g in sodium chloride 0.9 % 100 mL IVPB  Status:  Discontinued        2 g 200 mL/hr over 30 Minutes Intravenous Every 8 hours 07/03/22 0004 07/04/22 0802   07/03/22 0100  vancomycin (VANCOREADY) IVPB 1500 mg/300 mL        1,500 mg 150 mL/hr over 120 Minutes Intravenous  Once 07/03/22 0004 07/03/22 0415   07/03/22 0100  ceFEPIme (MAXIPIME) 2 g in sodium chloride 0.9 % 100 mL IVPB        2 g 200 mL/hr over 30 Minutes Intravenous  Once 07/03/22 0004 07/03/22 0204   06/25/22 1200  cefTRIAXone (ROCEPHIN) 1 g in sodium chloride 0.9 % 100 mL IVPB  Status:  Discontinued        1 g 200 mL/hr over 30 Minutes Intravenous Every 24 hours 06/25/22 1131 06/29/22 1010   06/23/22 1400  ceFEPIme (MAXIPIME) 2 g in sodium chloride 0.9 % 100 mL IVPB  Status:  Discontinued        2 g 200 mL/hr over 30 Minutes Intravenous Every 8 hours 06/23/22 1045 06/25/22 1131   06/23/22 1000  ceFEPIme (MAXIPIME) 1 g in sodium chloride 0.9 % 100 mL IVPB  Status:  Discontinued        1 g 200 mL/hr over 30 Minutes Intravenous Every 12 hours 06/23/22 0529 06/23/22 1045   06/23/22 1000  linezolid (ZYVOX) tablet 600 mg  Status:  Discontinued        600 mg Per Tube Every 12 hours 06/23/22 0529 06/24/22 1100   06/22/22 1145  cefTRIAXone (ROCEPHIN) 1 g in sodium chloride 0.9 % 100 mL IVPB  Status:  Discontinued        1 g 200 mL/hr over 30 Minutes Intravenous Every 24 hours 06/22/22 1055 06/23/22 0529   06/22/22 1145  azithromycin (ZITHROMAX) 500 mg in  sodium chloride 0.9 % 250 mL IVPB  Status:  Discontinued        500 mg 250 mL/hr over 60 Minutes Intravenous Every 24 hours 06/22/22 1055 06/23/22 0529        Subjective: Patient seen and examined at bedside.  Poor historian; slow to respond.  Looks comfortable. Objective: Vitals:   07/15/22 1000 07/15/22 1122 07/15/22 1624 07/16/22 0300  BP: 90/62 (!) 98/55 92/75 105/62  Pulse: 92 100 76 93  Resp: '17 16 16 19  '$ Temp:    98.1 F (36.7 C)  TempSrc:    Oral  SpO2: 100% 98% 100% 99%  Weight:      Height:        Intake/Output Summary (Last 24 hours) at 07/16/2022 0735 Last data filed at 07/16/2022 0304 Gross per 24 hour  Intake 1568.48 ml  Output 725 ml  Net 843.48 ml    Filed Weights   07/11/22 0434 07/12/22 0401 07/15/22 0500  Weight: 82 kg 82.6 kg 85.4 kg    Examination:  General: No acute distress.  Currently on room air.  Looks to be in no discomfort.   Data Reviewed: I have personally reviewed following labs and imaging studies  CBC: Recent Labs  Lab 07/10/22 0217 07/11/22 0253 07/12/22 0600 07/13/22 0555 07/15/22 0447  WBC 7.7 9.7 9.2 9.1 6.2  NEUTROABS  --   --   --   --  3.8  HGB 7.8* 7.7* 8.4* 8.1* 8.4*  HCT 25.0* 24.6* 26.3* 26.2* 26.6*  MCV 105.9* 106.0* 104.0* 106.9* 106.0*  PLT 219 239 217 167 151    Basic Metabolic Panel: Recent Labs  Lab 07/10/22 0217 07/11/22 0253 07/12/22 0600 07/13/22 0555 07/14/22 0550 07/15/22 0447  NA 148* 148*  --  145 142 143  K 4.0 4.3  --  4.4 4.0 3.7  CL 122* 119*  --  112* 114* 112*  CO2 24 26  --  '26 26 27  '$ GLUCOSE 173* 132*  --  95 93 119*  BUN 48* 40*  --  31* 28* 22  CREATININE 0.88 0.97  --  0.87 0.88 0.75  CALCIUM 8.5* 8.4*  --  9.2 8.8* 9.2  MG 2.8* 2.8* 2.7* 2.7* 2.6* 2.3  PHOS 2.6 2.8 2.7  --   --   --     GFR: Estimated Creatinine Clearance: 81.1 mL/min (by C-G formula based on SCr of 0.75 mg/dL). Liver Function Tests: Recent Labs  Lab 07/14/22 0550 07/15/22 0447  AST 102* 86*  ALT  90* 83*  ALKPHOS 251* 280*  BILITOT 0.6 1.0  PROT 5.2* 5.1*  ALBUMIN 1.7* 1.7*    No results for input(s): "LIPASE", "AMYLASE" in the last 168 hours. Recent Labs  Lab 07/09/22 1337 07/13/22 0555  AMMONIA 24 26    Coagulation Profile: Recent Labs  Lab 07/09/22 1844  INR 1.2    Cardiac Enzymes: No results for input(s): "CKTOTAL", "CKMB", "CKMBINDEX", "TROPONINI" in the last 168 hours. BNP (last 3 results) No results for input(s): "PROBNP" in the last 8760 hours. HbA1C: No results for input(s): "HGBA1C" in the last 72 hours. CBG: Recent Labs  Lab 07/15/22 0326 07/15/22 0744 07/15/22 1124 07/15/22 1625 07/15/22 1725  GLUCAP 73 86 74 65* 144*    Lipid Profile: No results for input(s): "CHOL", "HDL", "LDLCALC", "TRIG", "CHOLHDL", "LDLDIRECT" in the last 72 hours. Thyroid Function Tests: No results for input(s): "TSH", "T4TOTAL", "FREET4", "T3FREE", "THYROIDAB" in the last 72 hours. Anemia Panel: No results for input(s): "VITAMINB12", "FOLATE", "FERRITIN", "TIBC", "IRON", "RETICCTPCT" in the last 72 hours. Sepsis Labs: No results for input(s): "PROCALCITON", "LATICACIDVEN" in the last 168 hours.  No results found for this or any previous visit (from the past 240 hour(s)).        Radiology Studies: No results found.      Scheduled Meds:  Chlorhexidine Gluconate Cloth  6 each Topical Daily   Gerhardt's butt cream   Topical BID   mupirocin cream   Topical Daily   mouth rinse  15 mL Mouth Rinse 4 times per day   Continuous Infusions:  sodium chloride Stopped (07/08/22 2112)          Aline August, MD [facility] 07/16/2022, 7:35 AM

## 2022-07-16 NOTE — Progress Notes (Signed)
[facility] Liaison note.   Received request from TOC manager for family interest in [facility] with request for transfer today. Chart reviewed and eligibility confirmed.   Met with patient and spouse Darrell Horne to confirm interest and explain services. Family agreeable to transfer today. CSW aware.    Registration paperwork will be completed by Darrell Horne. Dr. Donald Hertweck to assume care per family request.    RN please call report to [phone].   Please arrange transport for patient once consents are signed.   Thank you,   Melissa O'Bryant, RN, DNP [facility] Hospital Liaison (listed on [facility] under [facility])   [phone]   [phone] 

## 2022-08-10 DEATH — deceased

## 2022-10-19 ENCOUNTER — Ambulatory Visit: Payer: Medicare Other | Admitting: Family
